# Patient Record
Sex: Male | Born: 1935
Health system: Southern US, Community
[De-identification: ages and names within clinical notes are randomized; demographics above are authoritative.]

## PROBLEM LIST (undated history)

## (undated) DIAGNOSIS — N400 Enlarged prostate without lower urinary tract symptoms: Secondary | ICD-10-CM

## (undated) DIAGNOSIS — I4891 Unspecified atrial fibrillation: Secondary | ICD-10-CM

## (undated) DIAGNOSIS — E785 Hyperlipidemia, unspecified: Secondary | ICD-10-CM

## (undated) DIAGNOSIS — C801 Malignant (primary) neoplasm, unspecified: Secondary | ICD-10-CM

## (undated) DIAGNOSIS — H905 Unspecified sensorineural hearing loss: Secondary | ICD-10-CM

## (undated) DIAGNOSIS — M8580 Other specified disorders of bone density and structure, unspecified site: Secondary | ICD-10-CM

## (undated) DIAGNOSIS — Z950 Presence of cardiac pacemaker: Secondary | ICD-10-CM

## (undated) DIAGNOSIS — H3530 Unspecified macular degeneration: Secondary | ICD-10-CM

## (undated) DIAGNOSIS — D649 Anemia, unspecified: Secondary | ICD-10-CM

## (undated) DIAGNOSIS — N529 Male erectile dysfunction, unspecified: Secondary | ICD-10-CM

## (undated) DIAGNOSIS — M1990 Unspecified osteoarthritis, unspecified site: Secondary | ICD-10-CM

## (undated) DIAGNOSIS — B081 Molluscum contagiosum: Secondary | ICD-10-CM

## (undated) DIAGNOSIS — I10 Essential (primary) hypertension: Secondary | ICD-10-CM

## (undated) DIAGNOSIS — C679 Malignant neoplasm of bladder, unspecified: Secondary | ICD-10-CM

## (undated) HISTORY — PX: CATARACT EXTRACTION: SUR2

## (undated) HISTORY — PX: HEMORRHOIDECTOMY WITH HEMORRHOID BANDING: SHX5633

## (undated) HISTORY — DX: Molluscum contagiosum: B08.1

## (undated) HISTORY — PX: TONSILLECTOMY: SUR1361

## (undated) HISTORY — PX: KNEE ARTHROSCOPY: SUR90

---

## 2004-11-01 ENCOUNTER — Encounter: Payer: Self-pay | Admitting: Internal Medicine

## 2004-11-07 ENCOUNTER — Encounter: Payer: Self-pay | Admitting: Internal Medicine

## 2006-01-29 ENCOUNTER — Ambulatory Visit: Payer: Self-pay | Admitting: Gastroenterology

## 2006-05-08 ENCOUNTER — Ambulatory Visit: Payer: Self-pay | Admitting: Internal Medicine

## 2006-05-08 IMAGING — US US CAROTID DUPLEX BILAT
1 series · 17 of 24 positions shown · non-contrast
Comparison: none

REASON FOR EXAM: artherosclerosis
COMMENTS:

[Series 1: us carotid duplex bilat · 17 of 58 slices shown]
[im 1/58]
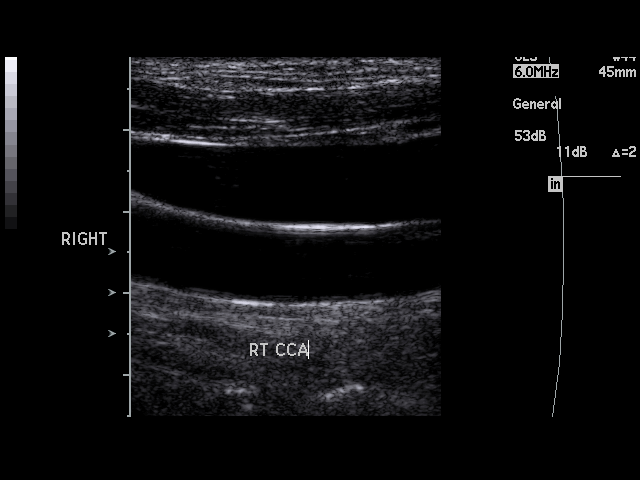
[im 5/58]
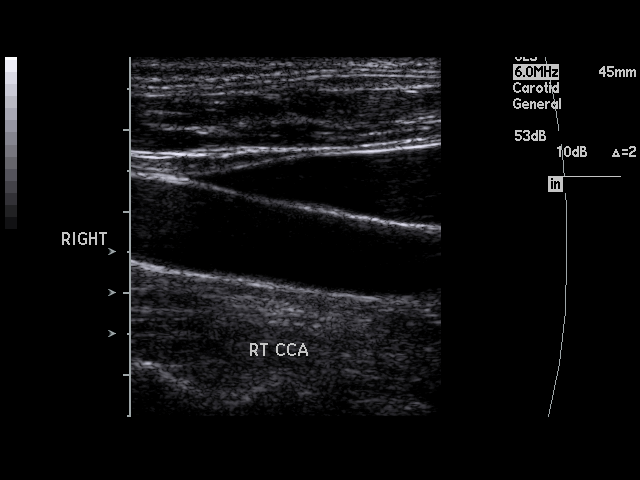
[im 8/58]
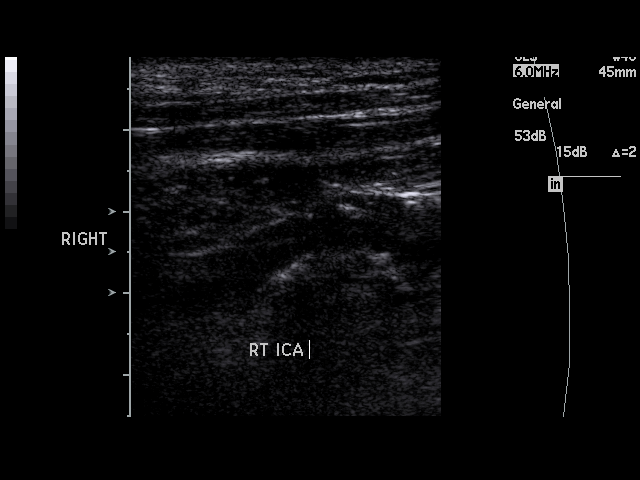
[im 10/58]
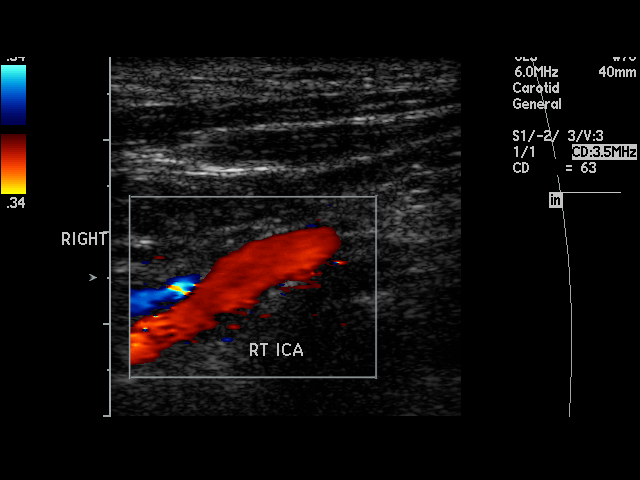
[im 15/58]
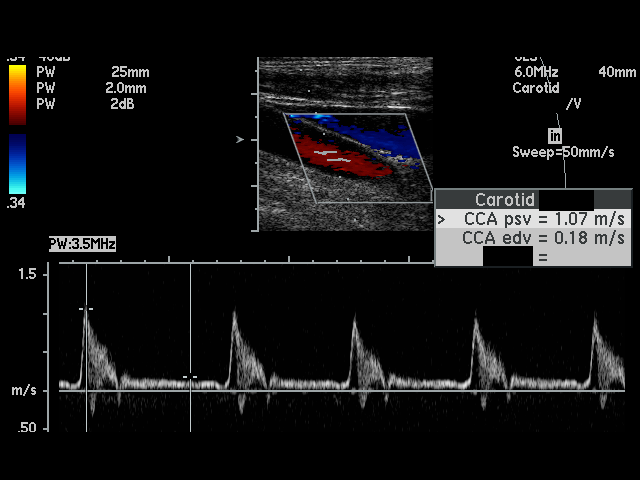
[im 18/58]
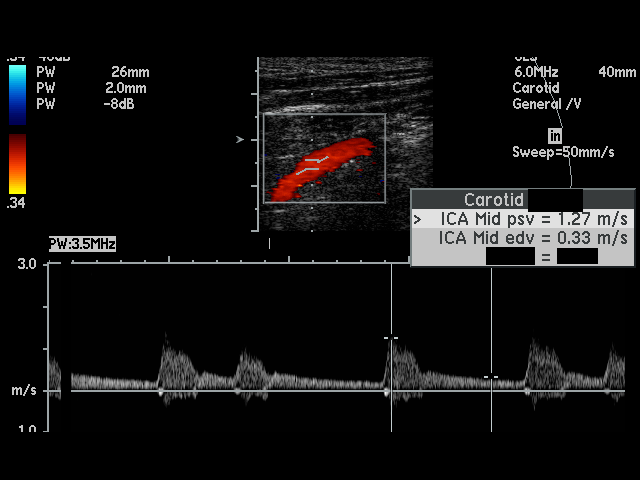
[im 23/58]
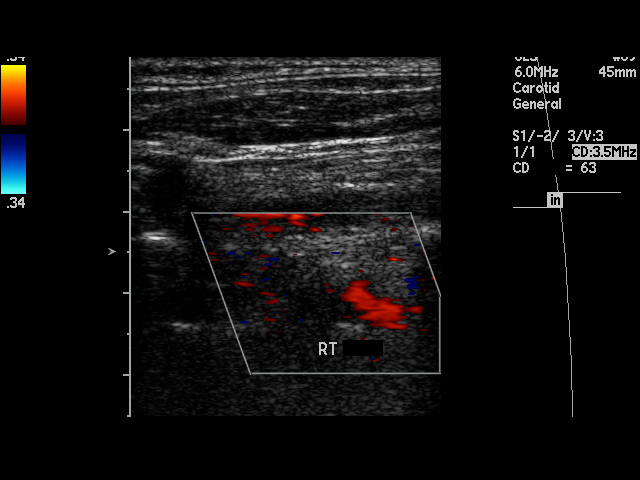
[im 25/58]
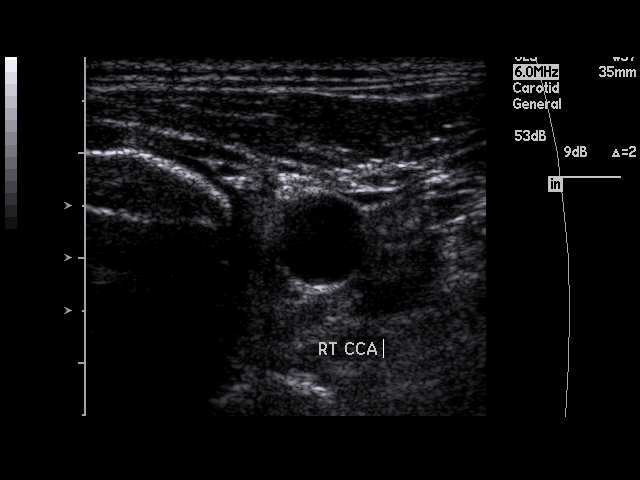
[im 30/58]
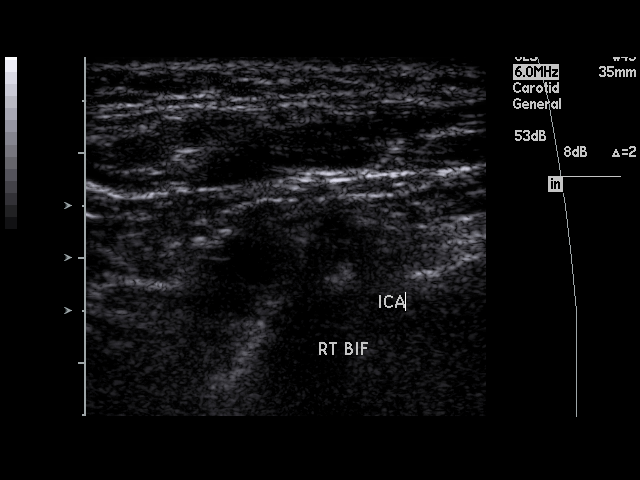
[im 33/58]
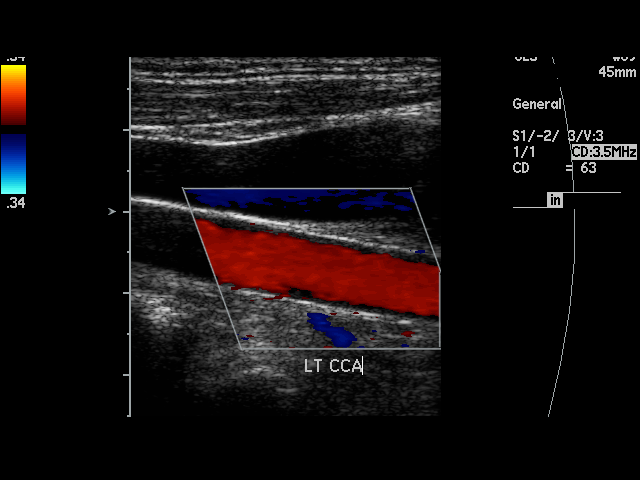
[im 35/58]
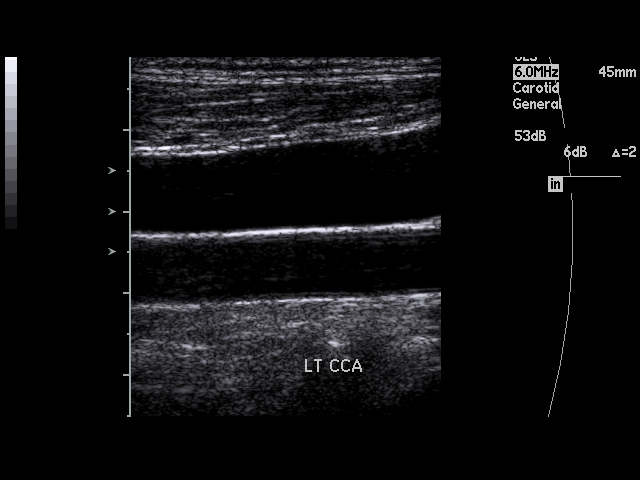
[im 40/58]
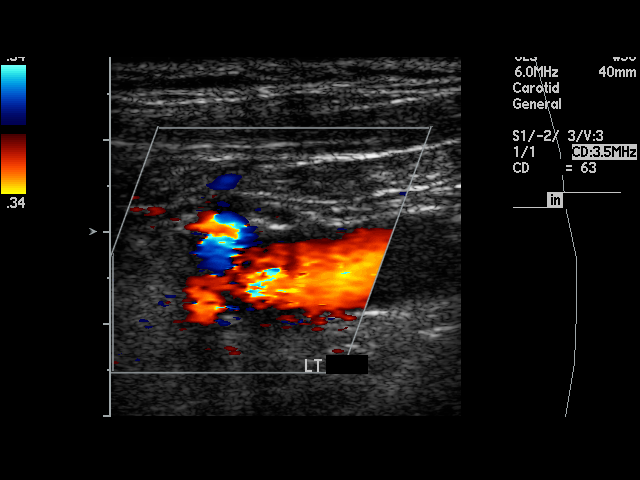
[im 43/58]
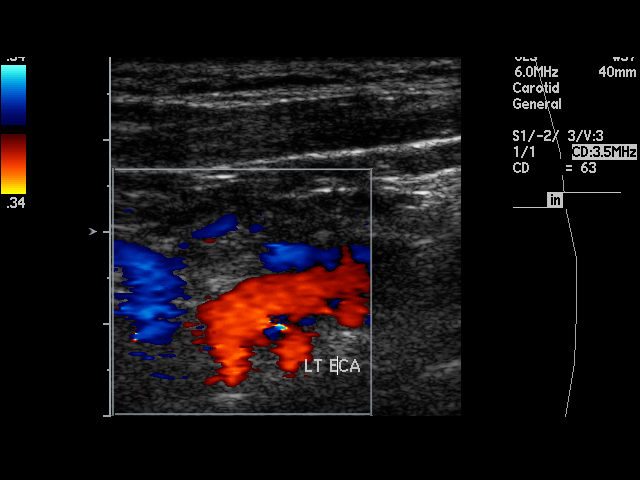
[im 48/58]
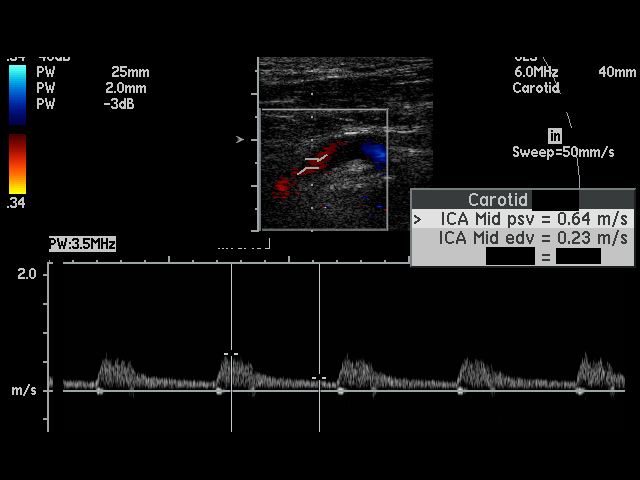
[im 50/58]
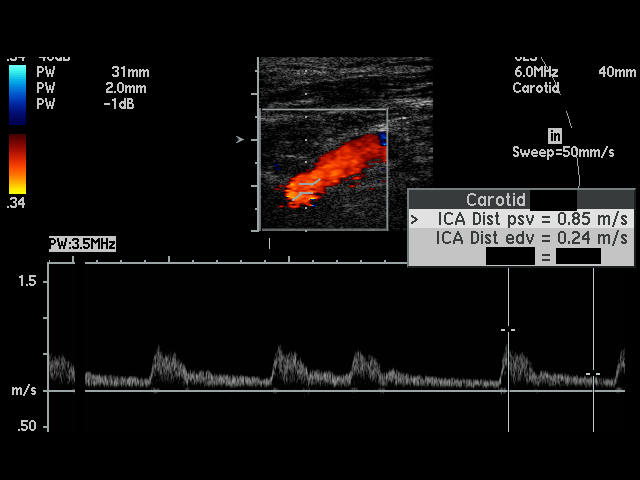
[im 53/58]
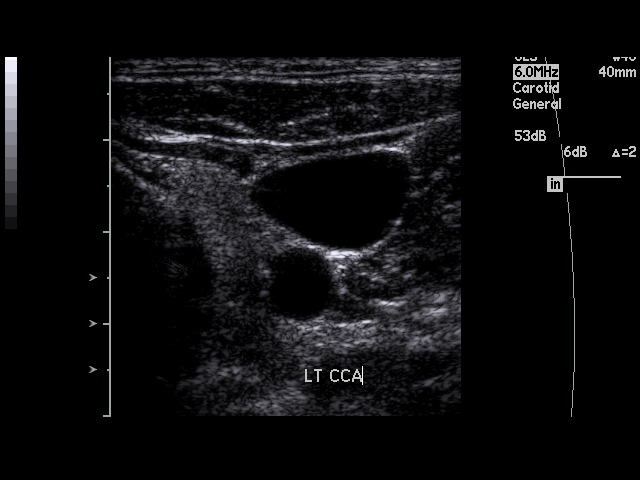
[im 58/58]
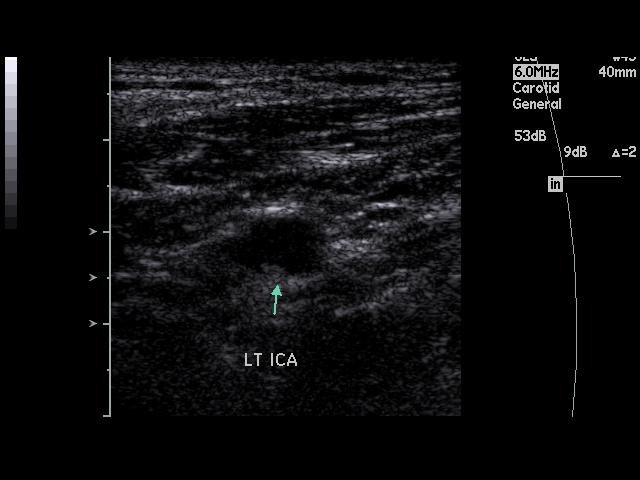

[17 of 24 positions shown; findings below may reference images not displayed]

PROCEDURE:     US  - US CAROTID DOPPLER BILATERAL  - [DATE] [DATE]

RESULT:     On the RIGHT there is a small amount of calcified plaque at the
bulb and proximal ICA.  Peak internal carotid systolic velocity measured 167
cm per second with peak common carotid velocity of 101 cm per second
corresponding to a ratio of 1.6.  On the LEFT peak internal carotid systolic
velocity measured approximately 70 cm per second with peak common carotid
velocity of 118 cm per second. This corresponds to a ratio of 0.7.  There is
a small amount of calcified plaque at the bulb on the LEFT. The vertebral
arteries are normal in flow direction bilaterally.
IMPRESSION: 1)I do not see evidence of significant carotid stenosis. A degree of
stenosis in the range of 50 to [DATE] be present on the RIGHT due to the
mildly elevated peak systolic velocity of 167 cm per second.

## 2009-04-01 ENCOUNTER — Ambulatory Visit: Payer: Self-pay | Admitting: Gastroenterology

## 2011-08-10 ENCOUNTER — Ambulatory Visit: Payer: Self-pay | Admitting: Otolaryngology

## 2011-08-10 DIAGNOSIS — I10 Essential (primary) hypertension: Secondary | ICD-10-CM

## 2011-08-10 LAB — POTASSIUM: Potassium: 4.4 mmol/L (ref 3.5–5.1)

## 2011-08-16 ENCOUNTER — Ambulatory Visit: Payer: Self-pay | Admitting: Otolaryngology

## 2012-05-28 ENCOUNTER — Ambulatory Visit: Payer: Self-pay | Admitting: Gastroenterology

## 2013-09-29 DIAGNOSIS — H3530 Unspecified macular degeneration: Secondary | ICD-10-CM | POA: Insufficient documentation

## 2013-09-29 DIAGNOSIS — H905 Unspecified sensorineural hearing loss: Secondary | ICD-10-CM | POA: Insufficient documentation

## 2013-09-29 DIAGNOSIS — M720 Palmar fascial fibromatosis [Dupuytren]: Secondary | ICD-10-CM | POA: Insufficient documentation

## 2013-09-29 DIAGNOSIS — M8580 Other specified disorders of bone density and structure, unspecified site: Secondary | ICD-10-CM | POA: Insufficient documentation

## 2013-09-29 DIAGNOSIS — N529 Male erectile dysfunction, unspecified: Secondary | ICD-10-CM | POA: Insufficient documentation

## 2013-11-14 DIAGNOSIS — R7302 Impaired glucose tolerance (oral): Secondary | ICD-10-CM | POA: Insufficient documentation

## 2013-11-14 DIAGNOSIS — Z79899 Other long term (current) drug therapy: Secondary | ICD-10-CM | POA: Insufficient documentation

## 2013-11-14 DIAGNOSIS — D72819 Decreased white blood cell count, unspecified: Secondary | ICD-10-CM | POA: Insufficient documentation

## 2014-11-01 NOTE — Op Note (Signed)
PATIENT NAME:  Christopher [name], Christopher [name] MR#:  [id number] DATE OF BIRTH:  [birth year]  DATE OF PROCEDURE:  08/16/2011  PREOPERATIVE DIAGNOSES: 1. Left lower eyelid ectropion.  2. Left forehead Mohs defect (2 x 3 cm).   POSTOPERATIVE DIAGNOSIS:  1. Left lower eyelid ectropion.  2. Left forehead Mohs defect (2 x 3 cm).   PROCEDURES:  1. Left upper eyelid to lower eyelid bipedicle myocutaneous flap.  2. Left canthoplasty.  3. Left Frost stitch.  4. Left forehead advancement rotation flap.   SURGEON: Janalee Dane, MD   DESCRIPTION OF PROCEDURE: The patient was placed in supine position on the operating room table. After general endotracheal anesthesia had been induced, the patient was turned 90 degrees clockwise from anesthesia. The left forehead was locally anesthetized, prepped and draped in the usual fashion. A 15 blade was used to make a backcut laterally and a Z-plasty was created medially so as to not elevate the brow too superiorly which would detract from the redundancy of upper eyelid tissue. Undermining was carried out in a subfrontalis plane making sure to preserve the insertion of the frontal branch of the facial nerve. The flaps were rotated, advanced, and secured with multiple interrupted 3-0 Vicryl and 4-0 Vicryl suture. The skin was then closed with multiple interrupted 5-0 nylon and 6-0 nylon sutures. Attention was directed to the eye. Topical proparacaine and local anesthesia was used. A corneal shield was placed. A 15-C blade was used to make incisions in the supratarsal crease, the superior incision and subciliary incision. The lower eyelid orbicularis and skin was separated to allow for inset of the flap. A 4-0 PDS suture was used to create a suspension canthoplasty and this was secured at [employer] tubercle. The flap was then elevated in the suborbicularis oculi muscle plane. Care was taken to preserve the pedicles medially and laterally. The flap was then inset and after the harvest  site had been closed 7-0 nylon the superior subciliary incision was closed with 6-0 fast absorbing gut. The inferior closure was carried out with 6-0 nylon suture allowing space for bleeding to occur so that no hematoma would form. A Frost stitch using a horizontal mattress over a Telfa pledget was carried out with a 6-0 nylon suture. Iced gauze was placed against the eye. The corneal shield had been removed before the Frost stitch and the patient was returned to anesthesia, allowed to emerge from anesthesia in the operating room, and taken to the recovery room in stable condition. There were no complications. Estimated blood loss 10 mL.   ____________________________ J. Nadeen Landau, MD [id number] D: 08/16/2011 19:32:38 ET T: 08/17/2011 07:16:20 ET JOB#: [id number]  cc: Janalee Dane, MD, <Dictator> Nicholos Johns MD ELECTRONICALLY SIGNED 08/23/2011 8:25

## 2015-01-04 DIAGNOSIS — R809 Proteinuria, unspecified: Secondary | ICD-10-CM | POA: Insufficient documentation

## 2015-02-28 ENCOUNTER — Encounter: Payer: Self-pay | Admitting: *Deleted

## 2015-02-28 ENCOUNTER — Emergency Department: Payer: Medicare Other

## 2015-02-28 ENCOUNTER — Inpatient Hospital Stay
Admission: EM | Admit: 2015-02-28 | Discharge: 2015-03-04 | DRG: 244 | Disposition: A | Discharge status: Home / Self Care | Payer: Medicare Other | Attending: Internal Medicine | Admitting: Internal Medicine

## 2015-02-28 DIAGNOSIS — Z789 Other specified health status: Secondary | ICD-10-CM | POA: Insufficient documentation

## 2015-02-28 DIAGNOSIS — E785 Hyperlipidemia, unspecified: Secondary | ICD-10-CM | POA: Diagnosis present

## 2015-02-28 DIAGNOSIS — H905 Unspecified sensorineural hearing loss: Secondary | ICD-10-CM | POA: Diagnosis present

## 2015-02-28 DIAGNOSIS — F1090 Alcohol use, unspecified, uncomplicated: Secondary | ICD-10-CM | POA: Diagnosis present

## 2015-02-28 DIAGNOSIS — R001 Bradycardia, unspecified: Principal | ICD-10-CM | POA: Diagnosis present

## 2015-02-28 DIAGNOSIS — Z79899 Other long term (current) drug therapy: Secondary | ICD-10-CM

## 2015-02-28 DIAGNOSIS — Z7982 Long term (current) use of aspirin: Secondary | ICD-10-CM | POA: Diagnosis not present

## 2015-02-28 DIAGNOSIS — Z833 Family history of diabetes mellitus: Secondary | ICD-10-CM | POA: Diagnosis not present

## 2015-02-28 DIAGNOSIS — F10929 Alcohol use, unspecified with intoxication, unspecified: Secondary | ICD-10-CM

## 2015-02-28 DIAGNOSIS — I10 Essential (primary) hypertension: Secondary | ICD-10-CM | POA: Diagnosis present

## 2015-02-28 DIAGNOSIS — R350 Frequency of micturition: Secondary | ICD-10-CM

## 2015-02-28 DIAGNOSIS — M8580 Other specified disorders of bone density and structure, unspecified site: Secondary | ICD-10-CM | POA: Diagnosis present

## 2015-02-28 DIAGNOSIS — F10129 Alcohol abuse with intoxication, unspecified: Secondary | ICD-10-CM | POA: Diagnosis present

## 2015-02-28 DIAGNOSIS — N400 Enlarged prostate without lower urinary tract symptoms: Secondary | ICD-10-CM | POA: Diagnosis present

## 2015-02-28 DIAGNOSIS — Z538 Procedure and treatment not carried out for other reasons: Secondary | ICD-10-CM | POA: Diagnosis not present

## 2015-02-28 DIAGNOSIS — R55 Syncope and collapse: Secondary | ICD-10-CM | POA: Diagnosis present

## 2015-02-28 DIAGNOSIS — Z801 Family history of malignant neoplasm of trachea, bronchus and lung: Secondary | ICD-10-CM

## 2015-02-28 DIAGNOSIS — N401 Enlarged prostate with lower urinary tract symptoms: Secondary | ICD-10-CM | POA: Diagnosis present

## 2015-02-28 DIAGNOSIS — E7800 Pure hypercholesterolemia, unspecified: Secondary | ICD-10-CM | POA: Diagnosis present

## 2015-02-28 DIAGNOSIS — I959 Hypotension, unspecified: Secondary | ICD-10-CM

## 2015-02-28 DIAGNOSIS — Z7289 Other problems related to lifestyle: Secondary | ICD-10-CM | POA: Diagnosis present

## 2015-02-28 DIAGNOSIS — Z950 Presence of cardiac pacemaker: Secondary | ICD-10-CM

## 2015-02-28 HISTORY — DX: Unspecified macular degeneration: H35.30

## 2015-02-28 HISTORY — DX: Gilbert syndrome: E80.4

## 2015-02-28 HISTORY — DX: Other specified disorders of bone density and structure, unspecified site: M85.80

## 2015-02-28 HISTORY — DX: Unspecified sensorineural hearing loss: H90.5

## 2015-02-28 HISTORY — DX: Benign prostatic hyperplasia without lower urinary tract symptoms: N40.0

## 2015-02-28 HISTORY — DX: Male erectile dysfunction, unspecified: N52.9

## 2015-02-28 HISTORY — DX: Hyperlipidemia, unspecified: E78.5

## 2015-02-28 HISTORY — DX: Essential (primary) hypertension: I10

## 2015-02-28 LAB — COMPREHENSIVE METABOLIC PANEL
ALK PHOS: 51 U/L (ref 38–126)
ALT: 18 U/L (ref 17–63)
AST: 24 U/L (ref 15–41)
Albumin: 3.7 g/dL (ref 3.5–5.0)
Anion gap: 7 (ref 5–15)
BUN: 25 mg/dL — ABNORMAL HIGH (ref 6–20)
CALCIUM: 9.5 mg/dL (ref 8.9–10.3)
CO2: 26 mmol/L (ref 22–32)
CREATININE: 1.1 mg/dL (ref 0.61–1.24)
Chloride: 104 mmol/L (ref 101–111)
GFR, EST NON AFRICAN AMERICAN: 60 mL/min — AB (ref >60)
Glucose, Bld: 101 mg/dL — ABNORMAL HIGH (ref 65–99)
Potassium: 4.8 mmol/L (ref 3.5–5.1)
Sodium: 137 mmol/L (ref 135–145)
Total Bilirubin: 0.7 mg/dL (ref 0.3–1.2)
Total Protein: 6.6 g/dL (ref 6.5–8.1)

## 2015-02-28 LAB — CBC
HCT: 43.4 % (ref 40.0–52.0)
HEMOGLOBIN: 14.3 g/dL (ref 13.0–18.0)
MCH: 31.7 pg (ref 26.0–34.0)
MCHC: 33 g/dL (ref 32.0–36.0)
MCV: 96 fL (ref 80.0–100.0)
Platelets: 208 10*3/uL (ref 150–440)
RBC: 4.52 MIL/uL (ref 4.40–5.90)
RDW: 14.2 % (ref 11.5–14.5)
WBC: 5.1 10*3/uL (ref 3.8–10.6)

## 2015-02-28 LAB — ETHANOL: Alcohol, Ethyl (B): 113 mg/dL — ABNORMAL HIGH (ref <5)

## 2015-02-28 LAB — PROTIME-INR
INR: 1
PROTHROMBIN TIME: 13.4 s (ref 11.4–15.0)

## 2015-02-28 LAB — TROPONIN I: Troponin I: <0.03 ng/mL (ref <0.031)

## 2015-02-28 LAB — APTT: aPTT: 29 seconds (ref 24–36)

## 2015-02-28 LAB — MAGNESIUM: MAGNESIUM: 2.2 mg/dL (ref 1.7–2.4)

## 2015-02-28 IMAGING — CR DG CHEST 1V PORT
1 series · 1 of 1 positions shown · non-contrast
Comparison: None.

CLINICAL DATA: Dizziness for 3 days, worse with standing up.
Syncopal episode tonight. Hypotension.

EXAM:
PORTABLE CHEST - 1 VIEW

[portable]
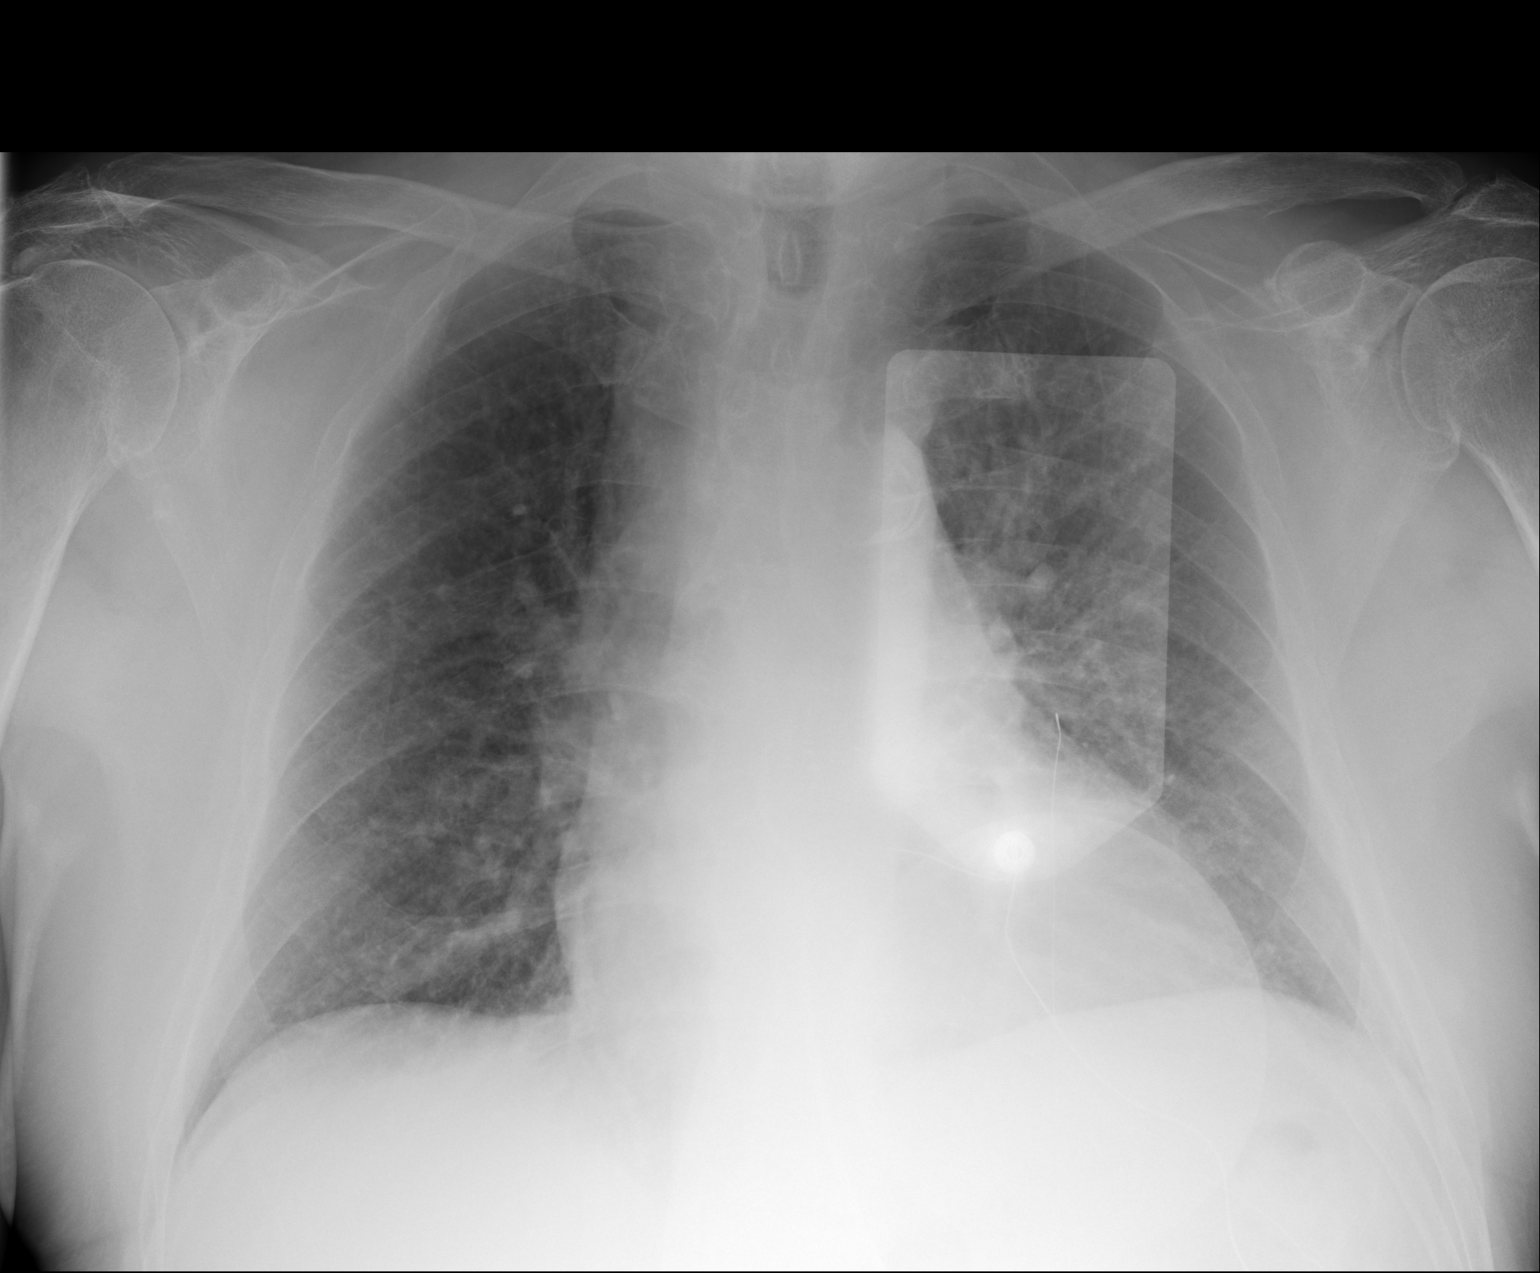

[1 of 1 positions shown; findings below may reference images not displayed]

FINDINGS: Slightly shallow inspiration. The heart size and mediastinal
contours are within normal limits. Both lungs are clear. The
visualized skeletal structures are unremarkable.
IMPRESSION: No active disease.

## 2015-02-28 MED ORDER — SODIUM CHLORIDE 0.9 % IV BOLUS (SEPSIS)
500.0000 mL | INTRAVENOUS | Status: AC
  Administered 2015-02-28: 500 mL via INTRAVENOUS

## 2015-02-28 MED ORDER — ATROPINE SULFATE 1 MG/ML IJ SOLN: 0.5000 mg | Freq: Once | INTRAMUSCULAR | Status: DC

## 2015-02-28 NOTE — ED Provider Notes (Addendum)
[facility] Emergency Department Provider Note  ____________________________________________  Time seen: Approximately 8:33 PM  I have reviewed the triage vital signs and the nursing notes.   HISTORY  Chief Complaint Dizziness    HPI Christopher [name] is a 81 y.o. male who is generally healthy for his age and well-appearing with only history of hypertension who presents after a syncopal episode tonight.  Reportedly he has been lightheaded and dizzy for about 3 days which is worse with standing up.  Tonight he was having dinner with his family andstates that he had a couple glasses of wine which is normal for him.  He then stood up and "the next thing I knew I was on the ground".  Reportedly he went down but struck a soft chair and he is currently denying any headache, neck pain, or extremity pain.  His blood pressure reportedly was low and he had a heart rate in the 50s prior to arrival.  In the emergency department his heart rate is in the upper 30s to lower 40s, covering most of the time right at 40.  The onset of the syncopal episode tonight was acute with no prodrome.  He denies any recent history of chest pain, shortness of breath, abdominal pain, back pain, nausea/vomiting, or diarrhea.  He has had no infectious symptoms recently.  He has no headache and no numbness nor tingling nor weaknessin his extremities.  He denies any ACS/cardiac history.   Past Medical History  Diagnosis Date  . Hypertension     There are no active problems to display for this patient.   No past surgical history on file.  Current Outpatient Rx  Name  Route  Sig  Dispense  Refill  . aspirin 325 MG tablet   Oral   Take 1 tablet by mouth daily.         Marland Kitchen atorvastatin (LIPITOR) 10 MG tablet   Oral   Take 1 tablet by mouth daily.         . calcium carbonate (TUMS - DOSED IN MG ELEMENTAL CALCIUM) 500 MG chewable tablet   Oral   Chew 1 tablet by mouth daily.         .  Cholecalciferol (VITAMIN D3) 1000 UNITS CAPS   Oral   Take 1 capsule by mouth daily.         . finasteride (PROSCAR) 5 MG tablet   Oral   Take 1 tablet by mouth daily.         . hydrochlorothiazide (HYDRODIURIL) 25 MG tablet   Oral   Take 12.5 mg by mouth daily.         Marland Kitchen lisinopril (PRINIVIL,ZESTRIL) 10 MG tablet   Oral   Take 1 tablet by mouth daily.         . Multiple Vitamin (MULTI-VITAMINS) TABS   Oral   Take 1 tablet by mouth daily.         . Multiple Vitamins-Minerals (OCUVITE PRESERVISION PO)   Oral   Take 1 tablet by mouth 2 (two) times daily.         Marland Kitchen NIFEdipine (PROCARDIA-XL/ADALAT CC) 30 MG 24 hr tablet   Oral   Take 1 tablet by mouth daily.         . Omega-3 Fatty Acids (FISH OIL) 1000 MG CAPS   Oral   Take 1 tablet by mouth 2 (two) times daily.         . tamsulosin (FLOMAX) 0.4 MG CAPS capsule  Oral   Take 1 capsule by mouth daily.         Marland Kitchen ZOSTAVAX 62229 UNT/0.65ML injection   Intramuscular   Inject 1 vial into the muscle once.           Dispense as written.     Allergies Review of patient's allergies indicates no known allergies.  No family history on file.  Social History Social History  Substance Use Topics  . Smoking status: Never Smoker   . Smokeless tobacco: None  . Alcohol Use: Yes    Review of Systems Constitutional: No fever/chills.  Passed out after dinner Eyes: No visual changes. ENT: No sore throat. Cardiovascular: Denies chest pain. Respiratory: Denies shortness of breath. Gastrointestinal: No abdominal pain.  No nausea, no vomiting.  No diarrhea.  No constipation. Genitourinary: Negative for dysuria. Musculoskeletal: Negative for back pain. Skin: Negative for rash. Neurological: Negative for headaches, focal weakness or numbness.  10-point ROS otherwise negative.  ____________________________________________   PHYSICAL EXAM:  VITAL SIGNS: ED Triage Vitals  Enc Vitals Group     BP  02/28/15 2029 100/45 mmHg     Pulse Rate 02/28/15 2029 39     Resp 02/28/15 2029 16     Temp 02/28/15 2029 98.2 F (36.8 C)     Temp Source 02/28/15 2029 Oral     SpO2 02/28/15 2029 96 %     Weight 02/28/15 2029 200 lb (90.719 kg)     Height 02/28/15 2029 6' (1.829 m)     Head Cir --      Peak Flow --      Pain Score --      Pain Loc --      Pain Edu? --      Excl. in [place]? --     Constitutional: Alert and oriented. Well appearing and in no acute distress.appears younger than chronological age Eyes: Conjunctivae are normal. PERRL. EOMI. Head: Atraumatic. Nose: No congestion/rhinnorhea. Mouth/Throat: Mucous membranes are moist.  Oropharynx non-erythematous. Neck: No stridor.  No cervical spine tenderness to palpation. Cardiovascular: profound bradycardia. Grossly normal heart sounds.  Good peripheral circulation. Respiratory: Normal respiratory effort.  No retractions. Lungs CTAB. Gastrointestinal: Soft and nontender. No distention. No abdominal bruits. No CVA tenderness. Musculoskeletal: No lower extremity tenderness nor edema.  No joint effusions. Neurologic:  Normal speech and language. No gross focal neurologic deficits are appreciated.  Skin:  Skin is warm, dry and intact. No rash noted. Psychiatric: Mood and affect are normal. Speech and behavior are normal.  ____________________________________________   LABS (all labs ordered are listed, but only abnormal results are displayed)  Labs Reviewed  COMPREHENSIVE METABOLIC PANEL - Abnormal; Notable for the following:    Glucose, Bld 101 (*)    BUN 25 (*)    GFR calc non Af Amer 60 (*)    All other components within normal limits  ETHANOL - Abnormal; Notable for the following:    Alcohol, Ethyl (B) 113 (*)    All other components within normal limits  APTT  CBC  MAGNESIUM  PROTIME-INR  TROPONIN I  URINALYSIS COMPLETEWITH MICROSCOPIC ([facility] ONLY)   ____________________________________________  EKG  ED ECG  REPORT I, Nyeli Holtmeyer, the attending physician, personally viewed and interpreted this ECG.   Date: 02/28/2015  EKG Time: 20:29  Rate: 40  Rhythm: sinus bradycardia  Axis: normal  Intervals:first-degree A-V block   ST&T Change: no ST or T-wave changes that would indicate acute ischemia  ____________________________________________  RADIOLOGY I, Micha Dosanjh,  personally viewed and evaluated these images (plain radiographs) as part of my medical decision making.   Dg Chest Portable 1 View  02/28/2015   CLINICAL DATA:  Dizziness for 3 days, worse with standing up. Syncopal episode tonight. Hypotension.  EXAM: PORTABLE CHEST - 1 VIEW  COMPARISON:  None.  FINDINGS: Slightly shallow inspiration. The heart size and mediastinal contours are within normal limits. Both lungs are clear. The visualized skeletal structures are unremarkable.  IMPRESSION: No active disease.   Electronically Signed   By: Lucienne Capers M.D.   On: 02/28/2015 21:25    ____________________________________________   PROCEDURES  Procedure(s) performed: None  Critical Care performed: No ____________________________________________   INITIAL IMPRESSION / ASSESSMENT AND PLAN / ED COURSE  Pertinent labs & imaging results that were available during my care of the patient were reviewed by me and considered in my medical decision making (see chart for details).  I am concerned about the patient's profound symptomatic bradycardia.  Though it does appear to be a sinus rhythm rather than a second or third degree heart block, I believe this is most likely the cause of his syncopal episode.  Though he has had no recent symptoms I worry that he may have had a cardiac event and his cardiac output is diminished from his baseline.  I have asked that he be placed on the Zoloft with pads in case pacing as necessary.  Currently he is alert and oriented 3 and is in no acute distress so hold off treating the bradycardia.  I am not  checking orthostatics given that I do not believe it would be safe at this time and given his pulse and relative hypotension.  I am also giving him a small fluid bolus of 500 mL normal saline and will likely give more.  ----------------------------------------- 9:50 PM on 02/28/2015 -----------------------------------------  The patient's labs are all reassuring and notable only for an ethanol level of 113 which I anticipate given the patient's history.  He remains alert and oriented but his blood pressure remains low with a systolic right around 703 and it had dropped down into the 90s, even after 1 L normal saline bolus.  I do not have a good explanation for his symptomatic bradycardia, particularly in the setting of hypotension; his heart rate should be elevated if he is hypotensive, and I feel that symptomatic bradycardia in the setting of hypotension bears cardiology evaluation.  I will leave him on the Zoll, I updated the patient and his family, and I have admitted him to the hospitalist.  ----------------------------------------- 11:16 PM on 02/28/2015 -----------------------------------------  It is been brought to my attention multiple times that the patient continues to be intermittently per family bradycardic and occasionally has pauses to about 3 seconds.  I reassessed him and ask how he feels and he states he has felt the same the whole time and has had no additional symptoms.  He is watching TV, remains alert and oriented with good mentation, and brightly said "thanks for checking on me, Doc!"  when I left the room.  At the request of Dr. Jannifer Franklin, the hospitalist, I spoke with the on-call cardiologist, Dr. Burt Knack.  I explained the situation in detail and we discussed the various findings on his EKG.  He agrees with my management up until this point and feels we should continue the plan to admit to the ICU with the Zoll in place but nottreat his bradycardia as long as he remains mentating  well and otherwise asymptomatic.  Keep him on bed rest.  If he were to start decompensating, Dr. Burt Knack recommended a very low dose dopamine infusion to keep his heart rate up.  He feels the patient likely has a nodal disorder and may end up with a pacemaker.  I advised Dr. Jannifer Franklin of my conversation and he understands and agrees with the plan.  Of note, Dr. Burt Knack also said he would be happy for Dr. Ubaldo Glassing or one of his partners to see the patient at the patient's requestsince the patient's wife is also a patient of Dr. Ubaldo Glassing. ____________________________________________  FINAL CLINICAL IMPRESSION(S) / ED DIAGNOSES  Final diagnoses:  Symptomatic bradycardia  Syncope, cardiogenic  Alcohol intoxication, with unspecified complication  Hypotension, unspecified hypotension type      NEW MEDICATIONS STARTED DURING THIS VISIT:  New Prescriptions   No medications on file     Hinda Kehr, MD 02/28/15 2151  Hinda Kehr, MD 02/28/15 2320

## 2015-02-28 NOTE — ED Notes (Signed)
External defibrillator attached with anterior and posterior all purpose pads and at bedside.

## 2015-02-28 NOTE — ED Notes (Signed)
Hospitalist at bedside evaluating patient.

## 2015-02-28 NOTE — ED Notes (Signed)
Dr. Karma Greaser at bedside evaluating patient.  Cardiology has been called.  Drs. Jannifer Franklin and [place] consulting on patient.  Continue to monitor.

## 2015-02-28 NOTE — ED Notes (Signed)
Pt reports dizziness x 3 days, worse with standing up. Pt had syncopal episode tonight, family friend took bp immediately after and it was in the 53's, hr in the 50's.

## 2015-02-28 NOTE — ED Notes (Signed)
AAOx3.  Skin warm and dry.  No SOB/DOE.  Denies c/o pain.  Continue to monitor.

## 2015-02-28 NOTE — ED Notes (Signed)
AAOx3.  Skin warm and dry.  States head feels "heavy", which is unchanged since syncopal episode.

## 2015-02-28 NOTE — ED Notes (Signed)
Patient noted have a 6 second pause.  Patient remains AAOx3.  Skin warm and dry.  No SOB/DOE.  Resting in bed.  NAD.  Hospitalist notified, patient admission status changed from telemetry to CCU.  Pacer pads remain on patient.  Defibrillator at bedside.  Continue to monitor.

## 2015-02-28 NOTE — H&P (Signed)
[facility] [place] NAME: Christopher [name]    MR#:  [id number]  DATE OF BIRTH:  1927/08/22  DATE OF ADMISSION:  02/28/2015  PRIMARY CARE PHYSICIAN: Ezequiel Kayser, MD   REQUESTING/REFERRING PHYSICIAN: Karma Greaser , M.D.  CHIEF COMPLAINT:   Chief Complaint  Patient presents with  . Dizziness    HISTORY OF PRESENT ILLNESS:  Christopher [name]  is a 81 y.o. male who presents with  Symptomatic bradycardia and a syncopal episode. Patient states he's been feeling dizzy for the past 2-3 days. He was at home sitting down watching the Olympics with his wife tonight , when he got up to go get some in the kitchen he had a syncopal episode , and states it had difficult time trying to stand back up again once he came to. He denies any significant trauma or hitting his head. His wife states that she did not witness any seizure-like activity, patient did not lose control of bowel or bladder, there was no true postictal state. Patient denies any chest pain, cough, recent fever /chills, abdominal pain, nausea/vomiting , or other symptoms. See full review of systems below. Patient does state that he has wife usually have couple of drinks each night , but that they've been doing this for a long time the patient has never had symptoms like this from the same. He denies any history of cardiac disease.  He was brought via EMS, who found him to be significantly bradycardic with a heart rate mostly in the 30s and 40s. EKG reads  as sinus bradycardia with first-degree AV block. Patient does state that he is on Procardia , as he had some reaction to atenolol in the past. He states that he was told at some point in the past he needed to be on a medication to treat his "fluctuating heart". Hospitalists were called for admission for workup of syncope and symptomatic bradycardia.  PAST MEDICAL HISTORY:   Past Medical History  Diagnosis Date  . Hypertension   . HLD  (hyperlipidemia)   . Gilbert disease   . Sensorineural hearing loss   . Osteopenia   . ED (erectile dysfunction)   . Macular degeneration   . BPH (benign prostatic hyperplasia)     PAST SURGICAL HISTORY:   Past Surgical History  Procedure Laterality Date  . Knee arthroscopy    . Cataract extraction    . Tonsillectomy    . Hemorrhoidectomy with hemorrhoid banding      SOCIAL HISTORY:   Social History  Substance Use Topics  . Smoking status: Never Smoker   . Smokeless tobacco: Not on file  . Alcohol Use: Yes    FAMILY HISTORY:   Family History  Problem Relation Age of Onset  . Diabetes Mother   . Lung cancer Father   . Cancer Paternal Uncle     DRUG ALLERGIES:  No Known Allergies  MEDICATIONS AT HOME:   Prior to Admission medications   Medication Sig Start Date End Date Taking? Authorizing Provider  aspirin 325 MG tablet Take 1 tablet by mouth daily.   Yes Historical Provider, MD  atorvastatin (LIPITOR) 10 MG tablet Take 1 tablet by mouth daily. 11/18/14  Yes Historical Provider, MD  calcium carbonate (TUMS - DOSED IN MG ELEMENTAL CALCIUM) 500 MG chewable tablet Chew 1 tablet by mouth daily.   Yes Historical Provider, MD  Cholecalciferol (VITAMIN D3) 1000 UNITS CAPS Take 1 capsule by mouth daily.   Yes Historical  Provider, MD  finasteride (PROSCAR) 5 MG tablet Take 1 tablet by mouth daily. 11/18/14  Yes Historical Provider, MD  hydrochlorothiazide (HYDRODIURIL) 25 MG tablet Take 12.5 mg by mouth daily.   Yes Historical Provider, MD  lisinopril (PRINIVIL,ZESTRIL) 10 MG tablet Take 1 tablet by mouth daily. 01/04/15 01/04/16 Yes Historical Provider, MD  Multiple Vitamin (MULTI-VITAMINS) TABS Take 1 tablet by mouth daily.   Yes Historical Provider, MD  Multiple Vitamins-Minerals (OCUVITE PRESERVISION PO) Take 1 tablet by mouth 2 (two) times daily.   Yes Historical Provider, MD  NIFEdipine (PROCARDIA-XL/ADALAT CC) 30 MG 24 hr tablet Take 1 tablet by mouth daily. 11/18/14  Yes  Historical Provider, MD  Omega-3 Fatty Acids (FISH OIL) 1000 MG CAPS Take 1 tablet by mouth 2 (two) times daily.   Yes Historical Provider, MD  tamsulosin (FLOMAX) 0.4 MG CAPS capsule Take 1 capsule by mouth daily. 08/18/14  Yes Historical Provider, MD  ZOSTAVAX 32992 UNT/0.65ML injection Inject 1 vial into the muscle once. 02/28/15  Yes Historical Provider, MD    REVIEW OF SYSTEMS:  Review of Systems  Constitutional: Negative for fever, chills, weight loss and malaise/fatigue.  HENT: Negative for ear pain, hearing loss and tinnitus.   Eyes: Negative for blurred vision, double vision, pain and redness.  Respiratory: Negative for cough, hemoptysis and shortness of breath.   Cardiovascular: Negative for chest pain, palpitations, orthopnea and leg swelling.  Gastrointestinal: Negative for nausea, vomiting, abdominal pain, diarrhea and constipation.  Genitourinary: Negative for dysuria, frequency and hematuria.  Musculoskeletal: Negative for back pain, joint pain and neck pain.  Skin:       No acne, rash, or lesions  Neurological: Positive for dizziness and loss of consciousness. Negative for tremors, focal weakness and weakness.  Endo/Heme/Allergies: Negative for polydipsia. Does not bruise/bleed easily.  Psychiatric/Behavioral: Negative for depression. The patient is not nervous/anxious and does not have insomnia.      VITAL SIGNS:   Filed Vitals:   02/28/15 2029 02/28/15 2039 02/28/15 2111 02/28/15 2159  BP: 100/45 113/34 91/47 101/52  Pulse: 39 46 38 39  Temp: 98.2 F (36.8 C)     TempSrc: Oral     Resp: 16 18 15 14   Height: 6' (1.829 m)     Weight: 90.719 kg (200 lb)     SpO2: 96% 93% 95% 93%   Wt Readings from Last 3 Encounters:  02/28/15 90.719 kg (200 lb)    PHYSICAL EXAMINATION:  Physical Exam  Vitals reviewed. Constitutional: He is oriented to person, place, and time. He appears well-developed and well-nourished. No distress.  HENT:  Head: Normocephalic and  atraumatic.  Mouth/Throat: Oropharynx is clear and moist.  Eyes: Conjunctivae and EOM are normal. Pupils are equal, round, and reactive to light. No scleral icterus.  Neck: Normal range of motion. Neck supple. No JVD present. No thyromegaly present.  Cardiovascular: Regular rhythm and intact distal pulses.  Exam reveals no gallop and no friction rub.   No murmur heard.  bradycardia  Respiratory: Effort normal and breath sounds normal. No respiratory distress. He has no wheezes. He has no rales.  GI: Soft. Bowel sounds are normal. He exhibits no distension. There is no tenderness.  Musculoskeletal: Normal range of motion. He exhibits edema ( 1+ bilateral lower extremity edema).  No arthritis, no gout  Lymphadenopathy:    He has no cervical adenopathy.  Neurological: He is alert and oriented to person, place, and time. No cranial nerve deficit.  No dysarthria, no aphasia  Skin:  Skin is warm and dry. No rash noted. No erythema.  Psychiatric: He has a normal mood and affect. His behavior is normal. Judgment and thought content normal.    LABORATORY PANEL:   CBC  Recent Labs Lab 02/28/15 2046  WBC 5.1  HGB 14.3  HCT 43.4  PLT 208   ------------------------------------------------------------------------------------------------------------------  Chemistries   Recent Labs Lab 02/28/15 2046  NA 137  K 4.8  CL 104  CO2 26  GLUCOSE 101*  BUN 25*  CREATININE 1.10  CALCIUM 9.5  MG 2.2  AST 24  ALT 18  ALKPHOS 51  BILITOT 0.7   ------------------------------------------------------------------------------------------------------------------  Cardiac Enzymes  Recent Labs Lab 02/28/15 2046  TROPONINI <0.03   ------------------------------------------------------------------------------------------------------------------  RADIOLOGY:  Dg Chest Portable 1 View  02/28/2015   CLINICAL DATA:  Dizziness for 3 days, worse with standing up. Syncopal episode tonight.  Hypotension.  EXAM: PORTABLE CHEST - 1 VIEW  COMPARISON:  None.  FINDINGS: Slightly shallow inspiration. The heart size and mediastinal contours are within normal limits. Both lungs are clear. The visualized skeletal structures are unremarkable.  IMPRESSION: No active disease.   Electronically Signed   By: Lucienne Capers M.D.   On: 02/28/2015 21:25    EKG:   Orders placed or performed during the hospital encounter of 02/28/15  . ED EKG  . ED EKG    IMPRESSION AND PLAN:  Principal Problem:   Symptomatic bradycardia -  Unclear etiology at this time. We'll hold his Procardia , this seems to be the only medication he is taking that might affect his heart rate. We'll trend cardiac enzymes, first set was negative in the ED. [facility] consult cardiology, order an echocardiogram. We'll keep him on telemetry. Active Problems:   Syncope, cardiogenic -  Likely due to the bradycardia , consult cardiology as above and defer to their recommendations.   HTN (hypertension) -  Blood pressure was on the low side when he came to the ED, we'll hold his antihypertensive medicines for now, support his blood pressure with fluids.   Alcohol use -  We will use CIWA protocol to monitor for signs of withdrawal while he is here.   HLD (hyperlipidemia) -  Continue home statin   BPH (benign prostatic hyperplasia) -  Continue finasteride  All the records are reviewed and case discussed with ED provider. Management plans discussed with the patient and/or family.  DVT PROPHYLAXIS: SubQ lovenox  ADMISSION STATUS: Inpatient  CODE STATUS:  full  TOTAL TIME TAKING CARE OF THIS PATIENT:  45 minutes.    Margee Trentham [place] 02/28/2015, 10:22 PM  [facility]  Office  [phone]  CC: Primary care physician; Ezequiel Kayser, MD

## 2015-03-01 ENCOUNTER — Inpatient Hospital Stay
Admit: 2015-03-01 | Discharge: 2015-03-01 | Disposition: A | Discharge status: Home / Self Care | Payer: Medicare Other | Attending: Internal Medicine | Admitting: Internal Medicine

## 2015-03-01 LAB — CBC
HCT: 41.8 % (ref 40.0–52.0)
Hemoglobin: 14 g/dL (ref 13.0–18.0)
MCH: 32 pg (ref 26.0–34.0)
MCHC: 33.5 g/dL (ref 32.0–36.0)
MCV: 95.4 fL (ref 80.0–100.0)
PLATELETS: 189 10*3/uL (ref 150–440)
RBC: 4.38 MIL/uL — ABNORMAL LOW (ref 4.40–5.90)
RDW: 14.1 % (ref 11.5–14.5)
WBC: 3.9 10*3/uL (ref 3.8–10.6)

## 2015-03-01 LAB — URINALYSIS COMPLETE WITH MICROSCOPIC (ARMC ONLY)
Bilirubin Urine: NEGATIVE
Glucose, UA: NEGATIVE mg/dL
HGB URINE DIPSTICK: NEGATIVE
KETONES UR: NEGATIVE mg/dL
Leukocytes, UA: NEGATIVE
NITRITE: NEGATIVE
PH: 6 (ref 5.0–8.0)
PROTEIN: NEGATIVE mg/dL
SPECIFIC GRAVITY, URINE: 1.01 (ref 1.005–1.030)
Squamous Epithelial / LPF: NONE SEEN

## 2015-03-01 LAB — BASIC METABOLIC PANEL
Anion gap: 6 (ref 5–15)
BUN: 19 mg/dL (ref 6–20)
CALCIUM: 8.9 mg/dL (ref 8.9–10.3)
CO2: 29 mmol/L (ref 22–32)
CREATININE: 0.83 mg/dL (ref 0.61–1.24)
Chloride: 105 mmol/L (ref 101–111)
GFR calc Af Amer: >60 mL/min (ref >60)
GLUCOSE: 93 mg/dL (ref 65–99)
POTASSIUM: 4.5 mmol/L (ref 3.5–5.1)
SODIUM: 140 mmol/L (ref 135–145)

## 2015-03-01 LAB — PHOSPHORUS: PHOSPHORUS: 3.1 mg/dL (ref 2.5–4.6)

## 2015-03-01 LAB — GLUCOSE, CAPILLARY: Glucose-Capillary: 94 mg/dL (ref 65–99)

## 2015-03-01 LAB — MAGNESIUM: MAGNESIUM: 2.1 mg/dL (ref 1.7–2.4)

## 2015-03-01 LAB — TSH: TSH: 1.316 u[IU]/mL (ref 0.350–4.500)

## 2015-03-01 LAB — TROPONIN I: Troponin I: <0.03 ng/mL (ref <0.031)

## 2015-03-01 MED ORDER — ENOXAPARIN SODIUM 40 MG/0.4ML ~~LOC~~ SOLN
40.0000 mg | SUBCUTANEOUS | Status: AC
  Administered 2015-03-01 – 2015-03-02 (×2): 40 mg via SUBCUTANEOUS
  Filled 2015-03-01 (×2): qty 0.4

## 2015-03-01 MED ORDER — ATORVASTATIN CALCIUM 10 MG PO TABS
10.0000 mg | ORAL_TABLET | Freq: Every day | ORAL | Status: DC
  Administered 2015-03-01 – 2015-03-03 (×3): 10 mg via ORAL
  Filled 2015-03-01 (×3): qty 1

## 2015-03-01 MED ORDER — SODIUM CHLORIDE 0.9 % IJ SOLN
3.0000 mL | Freq: Two times a day (BID) | INTRAMUSCULAR | Status: DC
  Administered 2015-03-01 – 2015-03-04 (×6): 3 mL via INTRAVENOUS

## 2015-03-01 MED ORDER — FOLIC ACID 1 MG PO TABS
1.0000 mg | ORAL_TABLET | Freq: Every day | ORAL | Status: DC
  Administered 2015-03-01 – 2015-03-04 (×4): 1 mg via ORAL
  Filled 2015-03-01 (×4): qty 1

## 2015-03-01 MED ORDER — SODIUM CHLORIDE 0.9 % IV SOLN
INTRAVENOUS | Status: AC
  Administered 2015-03-01: 03:00:00 via INTRAVENOUS

## 2015-03-01 MED ORDER — ACETAMINOPHEN 325 MG PO TABS: 650.0000 mg | ORAL_TABLET | Freq: Four times a day (QID) | ORAL | Status: DC | PRN

## 2015-03-01 MED ORDER — LORAZEPAM 1 MG PO TABS: 1.0000 mg | ORAL_TABLET | Freq: Four times a day (QID) | ORAL | Status: AC | PRN

## 2015-03-01 MED ORDER — VITAMIN B-1 100 MG PO TABS
100.0000 mg | ORAL_TABLET | Freq: Every day | ORAL | Status: DC
  Administered 2015-03-01 – 2015-03-04 (×4): 100 mg via ORAL
  Filled 2015-03-01 (×4): qty 1

## 2015-03-01 MED ORDER — LORAZEPAM 2 MG/ML IJ SOLN
1.0000 mg | Freq: Four times a day (QID) | INTRAMUSCULAR | Status: AC | PRN
Start: 2015-03-01 — End: 2015-03-04

## 2015-03-01 MED ORDER — ADULT MULTIVITAMIN W/MINERALS CH
1.0000 | ORAL_TABLET | Freq: Every day | ORAL | Status: DC
  Administered 2015-03-01 – 2015-03-03 (×3): 1 via ORAL
  Filled 2015-03-01 (×3): qty 1

## 2015-03-01 MED ORDER — ASPIRIN 325 MG PO TABS
325.0000 mg | ORAL_TABLET | Freq: Every day | ORAL | Status: DC
  Administered 2015-03-01 – 2015-03-04 (×4): 325 mg via ORAL
  Filled 2015-03-01 (×4): qty 1

## 2015-03-01 MED ORDER — ACETAMINOPHEN 650 MG RE SUPP: 650.0000 mg | Freq: Four times a day (QID) | RECTAL | Status: DC | PRN

## 2015-03-01 MED ORDER — THIAMINE HCL 100 MG/ML IJ SOLN
100.0000 mg | Freq: Every day | INTRAMUSCULAR | Status: DC
  Filled 2015-03-01: qty 2

## 2015-03-01 MED ORDER — FINASTERIDE 5 MG PO TABS
5.0000 mg | ORAL_TABLET | Freq: Every day | ORAL | Status: DC
  Administered 2015-03-01 – 2015-03-04 (×4): 5 mg via ORAL
  Filled 2015-03-01 (×4): qty 1

## 2015-03-01 NOTE — Progress Notes (Addendum)
[facility] [place] NAME: Derel Mcglasson    MR#:  [id number]  DATE OF BIRTH:  20-Jan-1928  SUBJECTIVE:  Doing well. Denies any dizziness  REVIEW OF SYSTEMS:   Review of Systems  Constitutional: Negative for fever, chills and weight loss.  HENT: Negative for ear discharge, ear pain and nosebleeds.   Eyes: Negative for blurred vision, pain and discharge.  Respiratory: Negative for sputum production, shortness of breath, wheezing and stridor.   Cardiovascular: Negative for chest pain, palpitations, orthopnea and PND.  Gastrointestinal: Negative for nausea, vomiting, abdominal pain and diarrhea.  Genitourinary: Negative for urgency and frequency.  Musculoskeletal: Negative for back pain and joint pain.  Neurological: Negative for sensory change, speech change, focal weakness and weakness.  Psychiatric/Behavioral: Negative for depression and hallucinations. The patient is not nervous/anxious.   All other systems reviewed and are negative.  Tolerating Diet:yes Tolerating PT: eval pending  DRUG ALLERGIES:  No Known Allergies  VITALS:  Blood pressure 142/66, pulse 62, temperature 97.8 F (36.6 C), temperature source Oral, resp. rate 24, height 6' (1.829 m), weight 90.719 kg (200 lb), SpO2 94 %.  PHYSICAL EXAMINATION:   Physical Exam  GENERAL:  81 y.o.-year-old patient lying in the bed with no acute distress.  EYES: Pupils equal, round, reactive to light and accommodation. No scleral icterus. Extraocular muscles intact.  HEENT: Head atraumatic, normocephalic. Oropharynx and nasopharynx clear.  NECK:  Supple, no jugular venous distention. No thyroid enlargement, no tenderness.  LUNGS: Normal breath sounds bilaterally, no wheezing, rales, rhonchi. No use of accessory muscles of respiration.  CARDIOVASCULAR: S1, S2 normal. No murmurs, rubs, or gallops.  ABDOMEN: Soft, nontender, nondistended. Bowel sounds present. No organomegaly or  mass.  EXTREMITIES: No cyanosis, clubbing or edema b/l.    NEUROLOGIC: Cranial nerves II through XII are intact. No focal Motor or sensory deficits b/l.   PSYCHIATRIC: The patient is alert and oriented x 3.  SKIN: No obvious rash, lesion, or ulcer.    LABORATORY PANEL:   CBC  Recent Labs Lab 03/01/15 0810  WBC 3.9  HGB 14.0  HCT 41.8  PLT 189    Chemistries   Recent Labs Lab 02/28/15 2046 03/01/15 0451 03/01/15 0810  NA 137  --  140  K 4.8  --  4.5  CL 104  --  105  CO2 26  --  29  GLUCOSE 101*  --  93  BUN 25*  --  19  CREATININE 1.10  --  0.83  CALCIUM 9.5  --  8.9  MG 2.2 2.1  --   AST 24  --   --   ALT 18  --   --   ALKPHOS 51  --   --   BILITOT 0.7  --   --     Cardiac Enzymes  Recent Labs Lab 03/01/15 0810  TROPONINI <0.03    RADIOLOGY:  Dg Chest Portable 1 View  02/28/2015   CLINICAL DATA:  Dizziness for 3 days, worse with standing up. Syncopal episode tonight. Hypotension.  EXAM: PORTABLE CHEST - 1 VIEW  COMPARISON:  None.  FINDINGS: Slightly shallow inspiration. The heart size and mediastinal contours are within normal limits. Both lungs are clear. The visualized skeletal structures are unremarkable.  IMPRESSION: No active disease.   Electronically Signed   By: Lucienne Capers M.D.   On: 02/28/2015 21:25     ASSESSMENT AND PLAN:  81 y.o. male who presents with Symptomatic bradycardia  and a syncopal episode. Patient states he's been feeling dizzy for the past 2-3 days. He was at home sitting down watching the Olympics , when he got up to go get some in the kitchen he had a syncopal episode   *Symptomatic bradycardia - Unclear etiology at this time. We'll hold his Procardia , this seems to be the only medication he is taking that might affect his heart rate.  - cardiac enzymes ok -consult with cardiology appreciated. Possible PM placement if continues with Bradycardia -HR 30-70's - echocardiogram. We'll keep him on telemetry.  * HTN  (hypertension) - Blood pressure was on the low side when he came to the ED, we'll hold his antihypertensive medicines for now, support his blood pressure with fluids.  * Alcohol use - We will use CIWA protocol to monitor for signs of withdrawal while he is here.  * HLD (hyperlipidemia) - Continue home statin  * BPH (benign prostatic hyperplasia) - Continue finasteride  Case discussed with Care Management/Social Worker. Management plans discussed with the patient, family and they are in agreement.  CODE STATUS: full  DVT Prophylaxis: heparin  TOTAL critical TIME TAKING CARE OF THIS PATIENT: 35 minutes.  >50% time spent on counselling and coordination of care with pt ad dr Ubaldo Glassing  POSSIBLE D/C IN 1-2DAYS, DEPENDING ON CLINICAL CONDITION.   Slayton Lubitz M.D on 03/01/2015 at 2:30 PM  Between 7am to 6pm - Pager - [phone]  After 6pm go to [email or web address] - password [email or web address] [facility]  Office  [phone]  CC: Primary care physician; Ezequiel Kayser, MD

## 2015-03-01 NOTE — Progress Notes (Signed)
Patient resting intermittently overnight, denies pain/ need.  HR is sinus brady with some pauses, blood pressure remains stable throughout. Patient given info on advance directive request to see chaplain later on today.  Will await cardiology consult.  See scm for further updates will continue to assess for changes/need.

## 2015-03-01 NOTE — Progress Notes (Signed)
*  PRELIMINARY RESULTS* Echocardiogram 2D Echocardiogram has been performed.  Christopher [name] 03/01/2015, 2:41 PM

## 2015-03-01 NOTE — Consult Note (Signed)
[facility]  CARDIOLOGY CONSULT NOTE  Patient ID: ETHIN DRUMMOND MRN: [id number] DOB/AGE: Apr 30, 1928 81 y.o.  Admit date: 02/28/2015 Referring Physician patel Primary Physician   Primary Cardiologist Callie Bunyard Reason for Consultation Bradycardia  HPI:  The the patient is an 81 year old male with no prior cardiac history.  He was on Procardia for blood pressure control.  Earlier this week he had a syncopal episode after standing up quickly.  He also had some lightheadedness and dizziness.  He presented to the emergency room where he had sinus bradycardia with variable rates in the upper 30s when sleeping.  He during waking hours his heart rate was in the lower 50s to mid 60s.  He denies any previous syncopal episodes.  He has no chest pain.  He is fairly active.  He denies any rapid or irregular heartbeat.  Procardia was  Stop.  Patient is on the stroke for some time.  He currently is in sinus rhythm to sinus bradycardia with no pauses or prolonged bradycardia while awake.  He does get bradycardic while sleeping likely consistent with possible sleep apnea.  ROS Review of Systems - General ROS: positive for  - Syncope  Respiratory ROS: no cough, shortness of breath, or wheezing Cardiovascular ROS: no chest pain or dyspnea on exertion Gastrointestinal ROS: no abdominal pain, change in bowel habits, or black or bloody stools Musculoskeletal ROS: negative Neurological ROS: positive for - dizziness and Syncope   Past Medical History  Diagnosis Date  . Hypertension   . HLD (hyperlipidemia)   . Gilbert disease   . Sensorineural hearing loss   . Osteopenia   . ED (erectile dysfunction)   . Macular degeneration   . BPH (benign prostatic hyperplasia)     Family History  Problem Relation Age of Onset  . Diabetes Mother   . Lung cancer Father   . Cancer Paternal Uncle     Social History   Social History  . Marital Status: Married    Spouse Name: N/A   . Number of Children: N/A  . Years of Education: N/A   Occupational History  . Not on file.   Social History Main Topics  . Smoking status: Never Smoker   . Smokeless tobacco: Not on file  . Alcohol Use: Yes  . Drug Use: No  . Sexual Activity: Not on file   Other Topics Concern  . Not on file   Social History Narrative  . No narrative on file    Past Surgical History  Procedure Laterality Date  . Knee arthroscopy    . Cataract extraction    . Tonsillectomy    . Hemorrhoidectomy with hemorrhoid banding       Prescriptions prior to admission  Medication Sig Dispense Refill Last Dose  . aspirin 325 MG tablet Take 1 tablet by mouth daily.   02/28/2015 at Unknown time  . atorvastatin (LIPITOR) 10 MG tablet Take 1 tablet by mouth daily.   02/28/2015 at Unknown time  . calcium carbonate (TUMS - DOSED IN MG ELEMENTAL CALCIUM) 500 MG chewable tablet Chew 1 tablet by mouth daily.   02/28/2015 at Unknown time  . Cholecalciferol (VITAMIN D3) 1000 UNITS CAPS Take 1 capsule by mouth daily.   02/28/2015 at Unknown time  . finasteride (PROSCAR) 5 MG tablet Take 1 tablet by mouth daily.   02/28/2015 at Unknown time  . hydrochlorothiazide (HYDRODIURIL) 25 MG tablet Take 12.5 mg by mouth daily.   02/28/2015 at Unknown  time  . lisinopril (PRINIVIL,ZESTRIL) 10 MG tablet Take 1 tablet by mouth daily.   02/28/2015 at Unknown time  . Multiple Vitamin (MULTI-VITAMINS) TABS Take 1 tablet by mouth daily.   02/28/2015 at Unknown time  . Multiple Vitamins-Minerals (OCUVITE PRESERVISION PO) Take 1 tablet by mouth 2 (two) times daily.   02/28/2015 at Unknown time  . NIFEdipine (PROCARDIA-XL/ADALAT CC) 30 MG 24 hr tablet Take 1 tablet by mouth daily.   02/28/2015 at Unknown time  . Omega-3 Fatty Acids (FISH OIL) 1000 MG CAPS Take 1 tablet by mouth 2 (two) times daily.   02/28/2015 at Unknown time  . tamsulosin (FLOMAX) 0.4 MG CAPS capsule Take 1 capsule by mouth daily.   02/28/2015 at Unknown time  . ZOSTAVAX 50722  UNT/0.65ML injection Inject 1 vial into the muscle once.   02/28/2015 at Unknown time    Physical Exam: Blood pressure 164/68, pulse 42, temperature 97.8 F (36.6 C), temperature source Oral, resp. rate 23, height 6' (1.829 m), weight 90.719 kg (200 lb), SpO2 95 %.   General appearance: alert and cooperative Resp: clear to auscultation bilaterally Cardio: S1, S2 normal and Sinus rhythm and sinus bradycardia GI: soft, non-tender; bowel sounds normal; no masses,  no organomegaly Extremities: extremities normal, atraumatic, no cyanosis or edema Pulses: 2+ and symmetric Neurologic: Grossly normal Labs:   Lab Results  Component Value Date   WBC 3.9 03/01/2015   HGB 14.0 03/01/2015   HCT 41.8 03/01/2015   MCV 95.4 03/01/2015   PLT 189 03/01/2015    Recent Labs Lab 02/28/15 2046 03/01/15 0810  NA 137 140  K 4.8 4.5  CL 104 105  CO2 26 29  BUN 25* 19  CREATININE 1.10 0.83  CALCIUM 9.5 8.9  PROT 6.6  --   BILITOT 0.7  --   ALKPHOS 51  --   ALT 18  --   AST 24  --   GLUCOSE 101* 93   Lab Results  Component Value Date   TROPONINI <0.03 03/01/2015      Radiology:   No acute process a EKG:  The sinus rhythm sinus bradycardia with no pauses  ASSESSMENT AND PLAN:  Patient with a history of no prior cardiac problems who was admitted with lightheadedness and a syncopal episode earlier this week.  He is currently stable.  Heart rate is in the 30s while sleeping however remains in the 50s to 60s currently.  Was in the 39s on  admission.  Patient is only on Procardia which has no significan AV nodal  Of fact.  He is hemodynamically stable at present etiology of his syncope is likely possibly due to his bradycardia although do not see profound episodes of bradycardia currently.  Will watch over the next 24 hours to make a determination regarding consideration for permanent pacemaker versus continued careful watching with an outpatient Holter monitor. Signed: Teodoro Spray MD,  [facility] 03/01/2015, 5:19 PM

## 2015-03-01 NOTE — Progress Notes (Signed)
°   03/01/15 0900  Clinical Encounter Type  Visited With Patient  Visit Type Initial  Referral From Nurse  Spiritual Encounters  Spiritual Needs Other (Comment) (Patient wanted Adv. Dir. Edu. will discuss with family)  Advance Directives (For Healthcare)  Would patient like information on creating an advanced directive? Yes - Scientist, clinical (histocompatibility and immunogenetics) given

## 2015-03-01 NOTE — Progress Notes (Signed)
Alert and oriented. HR low was 34/min at sleep.  HR 48-64/min while awake. Arouses easily with decreased HR. No c/o pain. Up to [facility] with minimal assist.  Steady on feet.  Denies dizziness.  No shOB. Lungs clear and diminished. Was seen by Dr Ubaldo Glassing.  Possibly perm pacemaker needed.  Given pamphlet on Biventricular pacer placement.  Prepare to transfer to room 260.

## 2015-03-02 LAB — CBC
HCT: 44.6 % (ref 40.0–52.0)
HEMOGLOBIN: 14.7 g/dL (ref 13.0–18.0)
MCH: 31.4 pg (ref 26.0–34.0)
MCHC: 32.9 g/dL (ref 32.0–36.0)
MCV: 95.5 fL (ref 80.0–100.0)
PLATELETS: 200 10*3/uL (ref 150–440)
RBC: 4.67 MIL/uL (ref 4.40–5.90)
RDW: 13.9 % (ref 11.5–14.5)
WBC: 5.2 10*3/uL (ref 3.8–10.6)

## 2015-03-02 LAB — BASIC METABOLIC PANEL
Anion gap: 6 (ref 5–15)
BUN: 18 mg/dL (ref 6–20)
CO2: 31 mmol/L (ref 22–32)
CREATININE: 0.97 mg/dL (ref 0.61–1.24)
Calcium: 9.5 mg/dL (ref 8.9–10.3)
Chloride: 102 mmol/L (ref 101–111)
Glucose, Bld: 94 mg/dL (ref 65–99)
Potassium: 4.6 mmol/L (ref 3.5–5.1)
SODIUM: 139 mmol/L (ref 135–145)

## 2015-03-02 LAB — PROTIME-INR
INR: 1.01
PROTHROMBIN TIME: 13.5 s (ref 11.4–15.0)

## 2015-03-02 MED ORDER — SODIUM CHLORIDE 0.9 % IR SOLN
80.0000 mg | Status: AC
  Filled 2015-03-02: qty 2

## 2015-03-02 MED ORDER — CHLORHEXIDINE GLUCONATE 4 % EX LIQD: 60.0000 mL | Freq: Once | CUTANEOUS | Status: DC

## 2015-03-02 MED ORDER — HYDRALAZINE HCL 20 MG/ML IJ SOLN: 10.0000 mg | Freq: Four times a day (QID) | INTRAMUSCULAR | Status: DC | PRN

## 2015-03-02 MED ORDER — HYDROCHLOROTHIAZIDE 12.5 MG PO CAPS
12.5000 mg | ORAL_CAPSULE | Freq: Every day | ORAL | Status: DC
  Administered 2015-03-02 – 2015-03-04 (×3): 12.5 mg via ORAL
  Filled 2015-03-02 (×3): qty 1

## 2015-03-02 MED ORDER — SODIUM CHLORIDE 0.9 % IV SOLN: INTRAVENOUS | Status: DC

## 2015-03-02 MED ORDER — CEFAZOLIN SODIUM-DEXTROSE 2-3 GM-% IV SOLR
2.0000 g | INTRAVENOUS | Status: AC
  Administered 2015-03-03: 2 g via INTRAVENOUS
  Filled 2015-03-02: qty 50

## 2015-03-02 NOTE — Progress Notes (Signed)
[place]  SUBJECTIVE: no complaints this am. Tele shows sinus bradycardia in high 30s to low 50s. No pauses   Filed Vitals:   03/01/15 1955 03/02/15 0024 03/02/15 0605 03/02/15 0719  BP: 166/66 140/77 160/58 158/56  Pulse: 57 40 43 37  Temp: 98.5 F (36.9 C)  97.9 F (36.6 C)   TempSrc: Oral  Oral   Resp: 16  16 18   Height:      Weight: 92.761 kg (204 lb 8 oz)     SpO2: 96% 95% 97% 98%    Intake/Output Summary (Last 24 hours) at 03/02/15 0748 Last data filed at 03/02/15 0427  Gross per 24 hour  Intake    825 ml  Output   1851 ml  Net  -1026 ml    LABS: Basic Metabolic Panel:  Recent Labs  02/28/15 2046 03/01/15 0451 03/01/15 0810  NA 137  --  140  K 4.8  --  4.5  CL 104  --  105  CO2 26  --  29  GLUCOSE 101*  --  93  BUN 25*  --  19  CREATININE 1.10  --  0.83  CALCIUM 9.5  --  8.9  MG 2.2 2.1  --   PHOS  --  3.1  --    Liver Function Tests:  Recent Labs  02/28/15 2046  AST 24  ALT 18  ALKPHOS 51  BILITOT 0.7  PROT 6.6  ALBUMIN 3.7   No results for input(s): LIPASE, AMYLASE in the last 72 hours. CBC:  Recent Labs  02/28/15 2046 03/01/15 0810  WBC 5.1 3.9  HGB 14.3 14.0  HCT 43.4 41.8  MCV 96.0 95.4  PLT 208 189   Cardiac Enzymes:  Recent Labs  03/01/15 0451 03/01/15 0810 03/01/15 1439  TROPONINI <0.03 <0.03 <0.03   BNP: Invalid input(s): POCBNP D-Dimer: No results for input(s): DDIMER in the last 72 hours. Hemoglobin A1C: No results for input(s): HGBA1C in the last 72 hours. Fasting Lipid Panel: No results for input(s): CHOL, HDL, LDLCALC, TRIG, CHOLHDL, LDLDIRECT in the last 72 hours. Thyroid Function Tests:  Recent Labs  03/01/15 0451  TSH 1.316   Anemia Panel: No results for input(s): VITAMINB12, FOLATE, FERRITIN, TIBC, IRON, RETICCTPCT in the last 72 hours.   Physical Exam: Blood pressure 158/56, pulse 37, temperature 97.9 F (36.6 C), temperature source Oral, resp. rate 18,  height 6' (1.829 m), weight 92.761 kg (204 lb 8 oz), SpO2 98 %. .  General appearance: alert and cooperative Resp: clear to auscultation bilaterally Cardio: sinus bradycardia GI: soft, non-tender; bowel sounds normal; no masses,  no organomegaly Extremities: extremities normal, atraumatic, no cyanosis or edema Pulses: 2+ and symmetric Neurologic: Grossly normal  TELEMETRY: Reviewed telemetry pt in sinus bradycardia:  ASSESSMENT AND PLAN:  Principal Problem:   Symptomatic bradycardia-apperas to have symptomatic sinus bradycardia with heart rates in the 40s-50s while awake. Had a syncopal episode. Discussed ppm with patient and he agrees to proceed. Will refer to Dr. Saralyn Pilar for placmenet of dual chamber ppm for symptomatic sinus bradycardia.  Active Problems:   Syncope, cardiogenic   HTN (hypertension)-controlled   HLD (hyperlipidemia)   BPH (benign prostatic hyperplasia)   Alcohol use    Teodoro Spray., MD, [facility] 03/02/2015 7:48 AM

## 2015-03-02 NOTE — Progress Notes (Signed)
   03/02/15 1100  Clinical Encounter Type  Visited With Patient  Visit Type Initial  Spiritual Encounters  Spiritual Needs Prayer  Stress Factors  Patient Stress Factors Family relationships;Health changes;Major life changes  Family Stress Factors Family relationships;Health changes;Major life changes  Visited patient. Discussed health changes with self & spouse (in ICU). Patient is optimistic with pace maker surgery scheduled for tomorrow. Will follow up this afternoon. Chap. Safaa Stingley G. [place]

## 2015-03-02 NOTE — Progress Notes (Signed)
[place] at [place] NAME: Christopher [name]    MR#:  [id number]  DATE OF BIRTH:  [birth year]  SUBJECTIVE:  Doing well.  REVIEW OF SYSTEMS:   Review of Systems  Constitutional: Negative for fever, chills and weight loss.  HENT: Negative for ear discharge, ear pain and nosebleeds.   Eyes: Negative for blurred vision, pain and discharge.  Respiratory: Negative for sputum production, shortness of breath, wheezing and stridor.   Cardiovascular: Negative for chest pain, palpitations, orthopnea and PND.  Gastrointestinal: Negative for nausea, vomiting, abdominal pain and diarrhea.  Genitourinary: Negative for urgency and frequency.  Musculoskeletal: Negative for back pain and joint pain.  Neurological: Negative for sensory change, speech change, focal weakness and weakness.  Psychiatric/Behavioral: Negative for depression and hallucinations. The patient is not nervous/anxious.   All other systems reviewed and are negative.  Tolerating Diet:yes Tolerating PT: eval pending  DRUG ALLERGIES:  No Known Allergies  VITALS:  Blood pressure 176/60, pulse 47, temperature 98.3 F (36.8 C), temperature source Oral, resp. rate 18, height 6' (1.829 m), weight 92.761 kg (204 lb 8 oz), SpO2 95 %.  PHYSICAL EXAMINATION:   Physical Exam  GENERAL:  81 y.o.-year-old patient lying in the bed with no acute distress.  EYES: Pupils equal, round, reactive to light and accommodation. No scleral icterus. Extraocular muscles intact.  HEENT: Head atraumatic, normocephalic. Oropharynx and nasopharynx clear.  NECK:  Supple, no jugular venous distention. No thyroid enlargement, no tenderness.  LUNGS: Normal breath sounds bilaterally, no wheezing, rales, rhonchi. No use of accessory muscles of respiration.  CARDIOVASCULAR: S1, S2 normal. No murmurs, rubs, or gallops.  ABDOMEN: Soft, nontender, nondistended. Bowel sounds present. No organomegaly or mass.   EXTREMITIES: No cyanosis, clubbing or edema b/l.    NEUROLOGIC: Cranial nerves II through XII are intact. No focal Motor or sensory deficits b/l.   PSYCHIATRIC: The patient is alert and oriented x 3.  SKIN: No obvious rash, lesion, or ulcer.    LABORATORY PANEL:   CBC  Recent Labs Lab 03/02/15 0757  WBC 5.2  HGB 14.7  HCT 44.6  PLT 200    Chemistries   Recent Labs Lab 02/28/15 2046 03/01/15 0451  03/02/15 0757  NA 137  --   < > 139  K 4.8  --   < > 4.6  CL 104  --   < > 102  CO2 26  --   < > 31  GLUCOSE 101*  --   < > 94  BUN 25*  --   < > 18  CREATININE 1.10  --   < > 0.97  CALCIUM 9.5  --   < > 9.5  MG 2.2 2.1  --   --   AST 24  --   --   --   ALT 18  --   --   --   ALKPHOS 51  --   --   --   BILITOT 0.7  --   --   --   < > = values in this interval not displayed.  Cardiac Enzymes  Recent Labs Lab 03/01/15 1439  TROPONINI <0.03    RADIOLOGY:  Dg Chest Portable 1 View  02/28/2015   CLINICAL DATA:  Dizziness for 3 days, worse with standing up. Syncopal episode tonight. Hypotension.  EXAM: PORTABLE CHEST - 1 VIEW  COMPARISON:  None.  FINDINGS: Slightly shallow inspiration. The heart size and mediastinal contours are within normal limits.  Both lungs are clear. The visualized skeletal structures are unremarkable.  IMPRESSION: No active disease.   Electronically Signed   By: Lucienne Capers M.D.   On: 02/28/2015 21:25     ASSESSMENT AND PLAN:  81 y.o. male who presents with Symptomatic bradycardia and a syncopal episode. Patient states he's been feeling dizzy for the past 2-3 days. He was at home sitting down watching the Olympics , when he got up to go get some in the kitchen he had a syncopal episode   *Symptomatic bradycardia  -hold his Procardia  - cardiac enzymes ok -consult with cardiology appreciated.  PM placement in am --HR 30-70's  * HTN (hypertension) - Blood pressure was on the low side when he came to the ED, we'll hold his  antihypertensive medicines for now, support his blood pressure with fluids.  * Alcohol use -  use CIWA protocol to monitor for signs of withdrawal while he is here.  * HLD (hyperlipidemia) - Continue home statin  * BPH (benign prostatic hyperplasia) - Continue finasteride  Case discussed with Care Management/Social Worker. Management plans discussed with the patient, family and they are in agreement.  CODE STATUS: full  DVT Prophylaxis: heparin  TOTAL critical TIME TAKING CARE OF THIS PATIENT: 35 minutes.  >50% time spent on counselling and coordination of care with pt ad dr Ubaldo Glassing  POSSIBLE D/C IN 1-2DAYS, DEPENDING ON CLINICAL CONDITION.   Christopher [name] M.D on 03/02/2015 at 3:01 PM  Between 7am to 6pm - Pager - [phone]  After 6pm go to [email or web address] - password [email or web address] [place] [facility]  Office  [phone]  CC: Primary care physician; Ezequiel Kayser, MD

## 2015-03-02 NOTE — Care Management Important Message (Signed)
Important Message  Patient Details  Name: Christopher [name] MRN: [id number] Date of Birth: 1927/12/15   Medicare Important Message Given:  Yes-second notification given    Juliann Pulse A Allmond 03/02/2015, 9:11 AM

## 2015-03-02 NOTE — Progress Notes (Signed)
Pacemaker cancelled today due to OR scheduling . WIll place tomorrow. Pt is currently stable with sinus bradycardia

## 2015-03-02 NOTE — Progress Notes (Signed)
BP was reported to MD patel. Hydralyzine 10 mg IV prn q6h for SBP <150 and HCTZ 12.5 mg daily ordered. No further orders.

## 2015-03-03 ENCOUNTER — Encounter
Admission: EM | Disposition: A | Discharge status: Home / Self Care | Payer: Self-pay | Source: Home / Self Care | Attending: Internal Medicine

## 2015-03-03 ENCOUNTER — Encounter: Payer: Self-pay | Admitting: Anesthesiology

## 2015-03-03 ENCOUNTER — Inpatient Hospital Stay: Payer: Medicare Other

## 2015-03-03 ENCOUNTER — Inpatient Hospital Stay: Payer: Medicare Other | Admitting: Registered Nurse

## 2015-03-03 HISTORY — PX: PACEMAKER INSERTION: SHX728

## 2015-03-03 IMAGING — CR DG CHEST 1V PORT
1 series · 1 of 1 positions shown · non-contrast
Comparison: None.

CLINICAL DATA: Post pacemaker placement

EXAM:
Portable exam [GR] hours compared [DATE]

[portable]
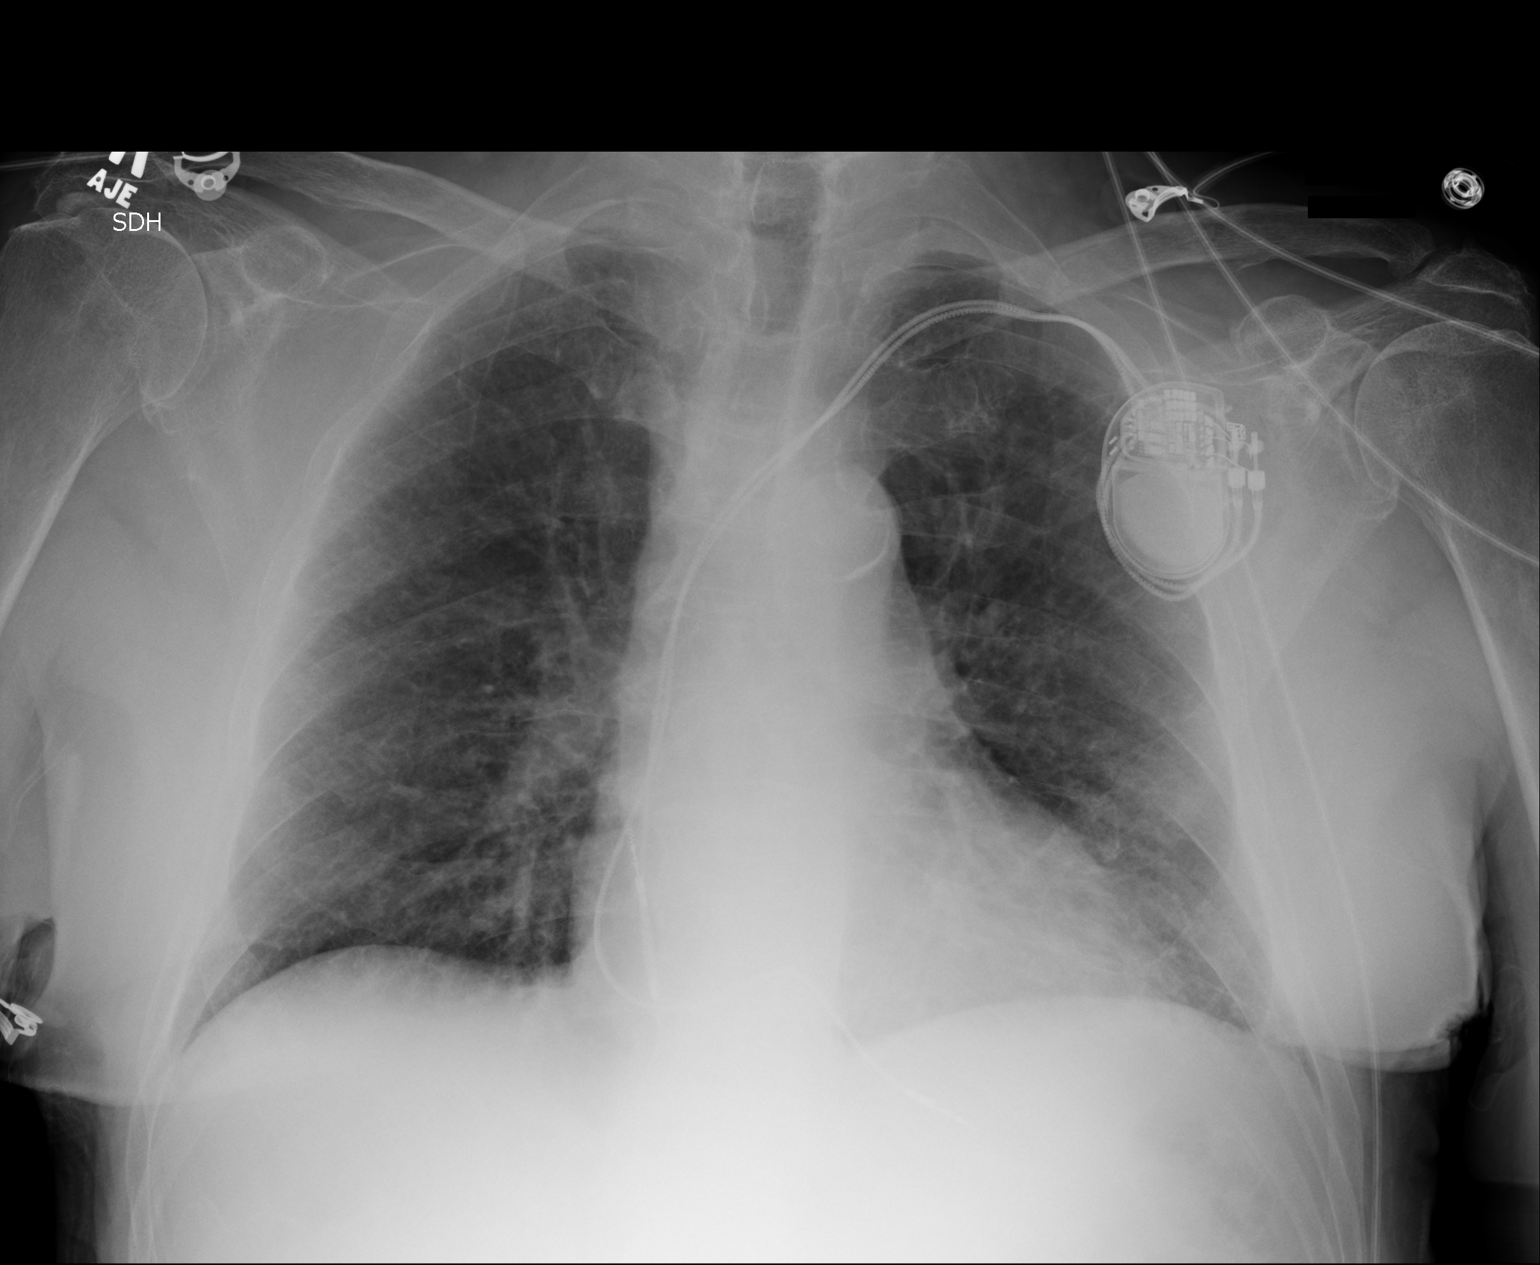

[1 of 1 positions shown; findings below may reference images not displayed]

FINDINGS: New LEFT subclavian transvenous pacemaker with leads projecting over
RIGHT atrium and RIGHT ventricle.

Normal heart size, mediastinal contours and pulmonary vascularity.

Atherosclerotic calcification aorta.

Minimal chronic bronchitic interstitial changes similar to previous
exam.

No definite acute infiltrate, pleural effusion, or pneumothorax.

Bones demineralized.
IMPRESSION: No acute abnormalities following placement of LEFT subclavian
pacemaker.

## 2015-03-03 SURGERY — INSERTION, CARDIAC PACEMAKER
Anesthesia: Monitor Anesthesia Care | Wound class: Clean

## 2015-03-03 MED ORDER — CEFAZOLIN SODIUM-DEXTROSE 2-3 GM-% IV SOLR
INTRAVENOUS | Status: DC | PRN
  Administered 2015-03-03: 2 g via INTRAVENOUS

## 2015-03-03 MED ORDER — LISINOPRIL 10 MG PO TABS
10.0000 mg | ORAL_TABLET | Freq: Every day | ORAL | Status: DC
  Administered 2015-03-03 – 2015-03-04 (×2): 10 mg via ORAL
  Filled 2015-03-03 (×4): qty 1

## 2015-03-03 MED ORDER — SODIUM CHLORIDE 0.9 % IR SOLN: 80.0000 mg | Status: DC

## 2015-03-03 MED ORDER — PROPOFOL INFUSION 10 MG/ML OPTIME
INTRAVENOUS | Status: DC | PRN
  Administered 2015-03-03: 75 ug/kg/min via INTRAVENOUS

## 2015-03-03 MED ORDER — LIDOCAINE 1 % OPTIME INJ - NO CHARGE
INTRAMUSCULAR | Status: DC | PRN
  Administered 2015-03-03: 30 mL

## 2015-03-03 MED ORDER — FENTANYL CITRATE (PF) 100 MCG/2ML IJ SOLN
INTRAMUSCULAR | Status: DC | PRN
  Administered 2015-03-03: 50 ug via INTRAVENOUS

## 2015-03-03 MED ORDER — ONDANSETRON HCL 4 MG/2ML IJ SOLN: 4.0000 mg | Freq: Once | INTRAMUSCULAR | Status: DC | PRN

## 2015-03-03 MED ORDER — LACTATED RINGERS IV SOLN
INTRAVENOUS | Status: DC | PRN
  Administered 2015-03-03: 12:00:00 via INTRAVENOUS

## 2015-03-03 MED ORDER — SODIUM CHLORIDE 0.9 % IR SOLN
Status: DC | PRN
  Administered 2015-03-03: 250 mL

## 2015-03-03 MED ORDER — FENTANYL CITRATE (PF) 100 MCG/2ML IJ SOLN: 25.0000 ug | INTRAMUSCULAR | Status: DC | PRN

## 2015-03-03 MED ORDER — HYDRALAZINE HCL 20 MG/ML IJ SOLN
10.0000 mg | Freq: Four times a day (QID) | INTRAMUSCULAR | Status: DC | PRN
  Filled 2015-03-03: qty 0.5

## 2015-03-03 MED ORDER — CEFAZOLIN SODIUM-DEXTROSE 2-3 GM-% IV SOLR
2.0000 g | INTRAVENOUS | Status: DC
  Filled 2015-03-03: qty 50

## 2015-03-03 MED ORDER — ONDANSETRON HCL 4 MG/2ML IJ SOLN: 4.0000 mg | Freq: Four times a day (QID) | INTRAMUSCULAR | Status: DC | PRN

## 2015-03-03 MED ORDER — ACETAMINOPHEN 325 MG PO TABS: 325.0000 mg | ORAL_TABLET | ORAL | Status: DC | PRN

## 2015-03-03 MED ORDER — PROPOFOL 10 MG/ML IV BOLUS
INTRAVENOUS | Status: DC | PRN
  Administered 2015-03-03 (×2): 50 mg via INTRAVENOUS

## 2015-03-03 MED ORDER — EPHEDRINE SULFATE 50 MG/ML IJ SOLN
INTRAMUSCULAR | Status: DC | PRN
  Administered 2015-03-03: 10 mg via INTRAVENOUS

## 2015-03-03 MED ORDER — CEFAZOLIN SODIUM 1-5 GM-% IV SOLN
1.0000 g | Freq: Four times a day (QID) | INTRAVENOUS | Status: AC
  Administered 2015-03-03 – 2015-03-04 (×3): 1 g via INTRAVENOUS
  Filled 2015-03-03 (×3): qty 50

## 2015-03-03 SURGICAL SUPPLY — 39 items
BAG DECANTER STRL (MISCELLANEOUS) ×3 IMPLANT
BRUSH SCRUB 4% CHG (MISCELLANEOUS) ×3 IMPLANT
CABLE SURG 12 DISP A/V CHANNEL (MISCELLANEOUS) ×3 IMPLANT
CANISTER SUCT 1200ML W/VALVE (MISCELLANEOUS) ×3 IMPLANT
CHLORAPREP W/TINT 26ML (MISCELLANEOUS) ×3 IMPLANT
CLOSURE WOUND 1/2 X4 (GAUZE/BANDAGES/DRESSINGS) ×1
COVER LIGHT HANDLE STERIS (MISCELLANEOUS) ×6 IMPLANT
COVER MAYO STAND STRL (DRAPES) ×3 IMPLANT
DRAPE C-ARM XRAY 36X54 (DRAPES) ×3 IMPLANT
DRESSING TELFA 4X3 1S ST N-ADH (GAUZE/BANDAGES/DRESSINGS) ×3 IMPLANT
DRSG TEGADERM 4X4.75 (GAUZE/BANDAGES/DRESSINGS) ×3 IMPLANT
DRSG TELFA 3X8 NADH (GAUZE/BANDAGES/DRESSINGS) ×3 IMPLANT
GLOVE BIO SURGEON STRL SZ7.5 (GLOVE) ×9 IMPLANT
GLOVE BIO SURGEON STRL SZ8 (GLOVE) ×3 IMPLANT
GOWN STRL REUS W/ TWL LRG LVL3 (GOWN DISPOSABLE) ×1 IMPLANT
GOWN STRL REUS W/ TWL XL LVL3 (GOWN DISPOSABLE) ×1 IMPLANT
GOWN STRL REUS W/TWL LRG LVL3 (GOWN DISPOSABLE) ×2
GOWN STRL REUS W/TWL XL LVL3 (GOWN DISPOSABLE) ×2
IMMOBILIZER SHDR MD LX WHT (SOFTGOODS) IMPLANT
IMMOBILIZER SHDR XL LX WHT (SOFTGOODS) ×3 IMPLANT
INTRO PACEMAKR LEAD 9FR 13CM (INTRODUCER) ×3
INTRO PACEMKR SHEATH II 7FR (MISCELLANEOUS) ×3
INTRODUCER PACEMKR LD 9FR 13CM (INTRODUCER) ×1 IMPLANT
INTRODUCER PACEMKR SHTH II 7FR (MISCELLANEOUS) ×1 IMPLANT
IV NS 500ML (IV SOLUTION) ×2
IV NS 500ML BAXH (IV SOLUTION) ×1 IMPLANT
KIT RM TURNOVER STRD PROC AR (KITS) ×3 IMPLANT
LABEL OR SOLS (LABEL) ×3 IMPLANT
LEAD CAPSURE NOVUS 5076-52CM (Lead) ×3 IMPLANT
LEAD CAPSURE NOVUS 5076-58CM (Lead) ×3 IMPLANT
MARKER SKIN W/RULER 31145785 (MISCELLANEOUS) ×3 IMPLANT
PACK PACE INSERTION (MISCELLANEOUS) ×3 IMPLANT
PAD GROUND ADULT SPLIT (MISCELLANEOUS) ×3 IMPLANT
PAD STATPAD (MISCELLANEOUS) IMPLANT
PPM ADVISA MRI DR A2DR01 (Pacemaker) ×3 IMPLANT
SPONGE XRAY 4X4 16PLY STRL (MISCELLANEOUS) ×3 IMPLANT
STRIP CLOSURE SKIN 1/2X4 (GAUZE/BANDAGES/DRESSINGS) ×2 IMPLANT
SUT SILK 0 SH 30 (SUTURE) ×9 IMPLANT
TOWEL OR 17X26 4PK STRL BLUE (TOWEL DISPOSABLE) ×3 IMPLANT

## 2015-03-03 NOTE — Progress Notes (Signed)
A & O. Pacemaker was placed to L subclavian. No complications. Room air. Daughter at the bedside. Ring and watch taken by daughter. Urinal. Arm in immoblizer. Dressing shows no bleeding. Pt to be d/c 8/25. Pt has no further concerns at this time.

## 2015-03-03 NOTE — Progress Notes (Signed)
[place] at [place] NAME: Christopher [name]    MR#:  [id number]  DATE OF BIRTH:  [birth year]  SUBJECTIVE:  Doing well. Awaiting PM placement  REVIEW OF SYSTEMS:   Review of Systems  Constitutional: Negative for fever, chills and weight loss.  HENT: Negative for ear discharge, ear pain and nosebleeds.   Eyes: Negative for blurred vision, pain and discharge.  Respiratory: Negative for sputum production, shortness of breath, wheezing and stridor.   Cardiovascular: Negative for chest pain, palpitations, orthopnea and PND.  Gastrointestinal: Negative for nausea, vomiting, abdominal pain and diarrhea.  Genitourinary: Negative for urgency and frequency.  Musculoskeletal: Negative for back pain and joint pain.  Neurological: Negative for sensory change, speech change, focal weakness and weakness.  Psychiatric/Behavioral: Negative for depression and hallucinations. The patient is not nervous/anxious.   All other systems reviewed and are negative.  Tolerating Diet:yes  DRUG ALLERGIES:  No Known Allergies  VITALS:  Blood pressure 176/73, pulse 67, temperature 98.5 F (36.9 C), temperature source Oral, resp. rate 22, height 6' (1.829 m), weight 92.761 kg (204 lb 8 oz), SpO2 98 %.  PHYSICAL EXAMINATION:   Physical Exam  GENERAL:  81 y.o.-year-old patient lying in the bed with no acute distress.  EYES: Pupils equal, round, reactive to light and accommodation. No scleral icterus. Extraocular muscles intact.  HEENT: Head atraumatic, normocephalic. Oropharynx and nasopharynx clear.  NECK:  Supple, no jugular venous distention. No thyroid enlargement, no tenderness.  LUNGS: Normal breath sounds bilaterally, no wheezing, rales, rhonchi. No use of accessory muscles of respiration.  CARDIOVASCULAR: S1, S2 normal. No murmurs, rubs, or gallops.  ABDOMEN: Soft, nontender, nondistended. Bowel sounds present. No organomegaly or mass.  EXTREMITIES: No  cyanosis, clubbing or edema b/l.    NEUROLOGIC: Cranial nerves II through XII are intact. No focal Motor or sensory deficits b/l.   PSYCHIATRIC: The patient is alert and oriented x 3.  SKIN: No obvious rash, lesion, or ulcer.    LABORATORY PANEL:   CBC  Recent Labs Lab 03/02/15 0757  WBC 5.2  HGB 14.7  HCT 44.6  PLT 200    Chemistries   Recent Labs Lab 02/28/15 2046 03/01/15 0451  03/02/15 0757  NA 137  --   < > 139  K 4.8  --   < > 4.6  CL 104  --   < > 102  CO2 26  --   < > 31  GLUCOSE 101*  --   < > 94  BUN 25*  --   < > 18  CREATININE 1.10  --   < > 0.97  CALCIUM 9.5  --   < > 9.5  MG 2.2 2.1  --   --   AST 24  --   --   --   ALT 18  --   --   --   ALKPHOS 51  --   --   --   BILITOT 0.7  --   --   --   < > = values in this interval not displayed.  Cardiac Enzymes  Recent Labs Lab 03/01/15 1439  TROPONINI <0.03    RADIOLOGY:  Dg Chest Port 1 View  03/03/2015   CLINICAL DATA:  Post pacemaker placement  EXAM: Portable exam 1336 hours compared 02/28/2015  COMPARISON:  None.  FINDINGS: New LEFT subclavian transvenous pacemaker with leads projecting over RIGHT atrium and RIGHT ventricle.  Normal heart size, mediastinal contours and pulmonary  vascularity.  Atherosclerotic calcification aorta.  Minimal chronic bronchitic interstitial changes similar to previous exam.  No definite acute infiltrate, pleural effusion, or pneumothorax.  Bones demineralized.  IMPRESSION: No acute abnormalities following placement of LEFT subclavian pacemaker.   Electronically Signed   By: Lavonia Dana M.D.   On: 03/03/2015 13:46   Dg C-arm 1-60 Min-no Report  03/03/2015   CLINICAL DATA: pacemaker insertion   C-ARM 1-60 MINUTES  Fluoroscopy was utilized by the requesting physician.  No radiographic  interpretation.      ASSESSMENT AND PLAN:  81 y.o. male who presents with Symptomatic bradycardia and a syncopal episode. Patient states he's been feeling dizzy for the past 2-3 days. He  was at home sitting down watching the Olympics , when he got up to go get some in the kitchen he had a syncopal episode   *Symptomatic bradycardia  -hold his Procardia  - cardiac enzymes ok -consult with cardiology appreciated.  PM placement today --HR 30-70's  * HTN (hypertension) -resume BP meds  * Alcohol use -  use CIWA protocol to monitor for signs of withdrawal while he is here.  * HLD (hyperlipidemia) - Continue home statin  * BPH (benign prostatic hyperplasia) - Continue finasteride  Case discussed with Care Management/Social Worker. Management plans discussed with the patient, family and they are in agreement.  CODE STATUS: full  DVT Prophylaxis: heparin  TOTAL critical TIME TAKING CARE OF THIS PATIENT: 35 minutes.  >50% time spent on counselling and coordination of care with pt ad dr Ubaldo Glassing  POSSIBLE D/C IN 1-2DAYS, DEPENDING ON CLINICAL CONDITION.   Tecia Cinnamon M.D on 03/03/2015 at 1:54 PM  Between 7am to 6pm - Pager - [phone]  After 6pm go to [email or web address] - password [email or web address]  Office  [phone]  CC: Primary care physician; Ezequiel Kayser, MD

## 2015-03-03 NOTE — Anesthesia Procedure Notes (Signed)
Date/Time: 03/03/2015 12:10 PM Performed by: Doreen Salvage Pre-anesthesia Checklist: Patient identified, Emergency Drugs available, Suction available and Patient being monitored Patient Re-evaluated:Patient Re-evaluated prior to inductionOxygen Delivery Method: Nasal cannula

## 2015-03-03 NOTE — Anesthesia Preprocedure Evaluation (Signed)
Anesthesia Evaluation  Patient identified by MRN, date of birth, ID band Patient awake    Reviewed: Allergy & Precautions, NPO status , Patient's Chart, lab work & pertinent test results, reviewed documented beta blocker date and time   Airway Mallampati: II  TM Distance: >3 FB     Dental  (+) Chipped   Pulmonary          Cardiovascular hypertension, Pt. on medications     Neuro/Psych    GI/Hepatic   Endo/Other    Renal/GU      Musculoskeletal   Abdominal   Peds  Hematology   Anesthesia Other Findings   Reproductive/Obstetrics                             Anesthesia Physical Anesthesia Plan  ASA: III  Anesthesia Plan: MAC   Post-op Pain Management:    Induction:   Airway Management Planned:   Additional Equipment:   Intra-op Plan:   Post-operative Plan:   Informed Consent: I have reviewed the patients History and Physical, chart, labs and discussed the procedure including the risks, benefits and alternatives for the proposed anesthesia with the patient or authorized representative who has indicated his/her understanding and acceptance.     Plan Discussed with: CRNA  Anesthesia Plan Comments:         Anesthesia Quick Evaluation

## 2015-03-03 NOTE — Progress Notes (Signed)
Pt awakens, nasal trumpet removed intact.

## 2015-03-03 NOTE — Transfer of Care (Signed)
Immediate Anesthesia Transfer of Care Note  Patient: Christopher [name]  Procedure(s) Performed: Procedure(s): INSERTION DUAL LEAD PACEMAKER (N/A)  Patient Location: PACU  Anesthesia Type:General  Level of Consciousness: sedated  Airway & Oxygen Therapy: Patient Spontanous Breathing and Patient connected to face mask oxygen  Post-op Assessment: Report given to RN and Post -op Vital signs reviewed and stable  Post vital signs: Reviewed and stable  Last Vitals:  Filed Vitals:   03/03/15 1319  BP: 151/65  Pulse: 63  Temp: 36.9 C  Resp: 13    Complications: No apparent anesthesia complications

## 2015-03-03 NOTE — Anesthesia Postprocedure Evaluation (Signed)
  Anesthesia Post-op Note  Patient: Christopher [name]  Procedure(s) Performed: Procedure(s): INSERTION DUAL LEAD PACEMAKER (N/A)  Anesthesia type:MAC  Patient location: PACU  Post pain: Pain level controlled  Post assessment: Post-op Vital signs reviewed, Patient's Cardiovascular Status Stable, Respiratory Function Stable, Patent Airway and No signs of Nausea or vomiting  Post vital signs: Reviewed and stable  Last Vitals:  Filed Vitals:   03/03/15 1442  BP: 156/59  Pulse: 63  Temp: 36.3 C  Resp: 18    Level of consciousness: awake, [name]  and patient cooperative  Complications: No apparent anesthesia complications

## 2015-03-03 NOTE — Op Note (Signed)
[facility] Card[facility]   02/28/2015 - 03/03/2015                     1:17 PM  PATIENT:  Christopher [name]    PRE-OPERATIVE DIAGNOSIS:  SYMPTOMATIC BRADYCARDIA  POST-OPERATIVE DIAGNOSIS:  Same  PROCEDURE:  INSERTION DUAL LEAD PACEMAKER  SURGEON:  Maigen Mozingo, MD    ANESTHESIA:     PREOPERATIVE INDICATIONS:  Christopher [name] is a  81 y.o. male with a diagnosis of SYMPTOMATIC BRADYCARDIA who failed conservative measures and elected for surgical management.    The risks benefits and alternatives were discussed with the patient preoperatively including but not limited to the risks of infection, bleeding, cardiopulmonary complications, the need for revision surgery, among others, and the patient was willing to proceed.   OPERATIVE PROCEDURE: The patient was brought to the operating room the fasting state. The left pectoral region was prepped and draped in the usual sterile manner. Anesthesia was obtained with 1% Xylocaine locally. A 6 cm incision was performed over the left pectoral region. The pacemaker pocket was generated by electrocautery and blunt dissection. Access was obtained to left subclavian vein by fine needle aspiration. MRI compatible leads were positioned into the right ventricular apical septum and right atrial appendage under fluoroscopic guidance. After proper thresholds were obtained the leads were sutured in place. Pacemaker pocket was irrigated with gentamicin solution. The leads were connected to a MRI compatible, dual-chamber rate responsive pacemaker generator ( [employer]visa  DR MR,I  [id number] 1 ) and positioned into the pocket. Pocket was closed with 20 and 40 Vicryls, respectively. Steri-Strips and a pressure dressing were applied. There were no procedural complications.

## 2015-03-03 NOTE — Progress Notes (Signed)
Pt arrives to PACU with nasal trumpet to left nostril.

## 2015-03-04 ENCOUNTER — Encounter: Payer: Self-pay | Admitting: Cardiology

## 2015-03-04 MED ORDER — CEPHALEXIN 500 MG PO CAPS
500.0000 mg | ORAL_CAPSULE | Freq: Two times a day (BID) | ORAL | Status: DC
  Administered 2015-03-04: 500 mg via ORAL
  Filled 2015-03-04: qty 1

## 2015-03-04 MED ORDER — LISINOPRIL 20 MG PO TABS: 10.0000 mg | ORAL_TABLET | Freq: Every day | ORAL | Status: DC

## 2015-03-04 MED ORDER — CEPHALEXIN 500 MG PO CAPS: 500.0000 mg | ORAL_CAPSULE | Freq: Two times a day (BID) | ORAL | Status: DC

## 2015-03-04 MED ORDER — HYDROCHLOROTHIAZIDE 25 MG PO TABS: 25.0000 mg | ORAL_TABLET | Freq: Every day | ORAL | Status: DC

## 2015-03-04 NOTE — Care Management Important Message (Signed)
Important Message  Patient Details  Name: Christopher [name] MRN: [id number] Date of Birth: 04-04-1928   Medicare Important Message Given:  Yes-third notification given    Darius Bump Allmond 03/04/2015, 9:39 AM

## 2015-03-04 NOTE — Progress Notes (Signed)
   03/04/15 0900  Clinical Encounter Type  Visited With Patient and family together  Visit Type Initial  Referral From Nurse  Consult/Referral To Chaplain  Spiritual Encounters  Spiritual Needs Literature  Advance Directives (For Healthcare)  Does patient have an advance directive? No  Would patient like information on creating an advanced directive? Yes - Scientist, clinical (histocompatibility and immunogenetics) given  Provided pastoral support and Forensic scientist education and materials.  Pt being discharged so will complete somewhere else.  [place] [phone]

## 2015-03-04 NOTE — Progress Notes (Signed)
Christopher [name]  SUBJECTIVE: Doing well post ppm. A pace, v sense. Site intact with mild edema.    Filed Vitals:   03/03/15 1442 03/03/15 2031 03/04/15 0516 03/04/15 0724  BP: 156/59 175/61 171/65 144/65  Pulse: 63 58 62 59  Temp: 97.4 F (36.3 C) 97.7 F (36.5 C) 98.4 F (36.9 C)   TempSrc: Oral Oral Oral   Resp: 18 18 18 18   Height:      Weight:      SpO2: 95% 97% 96%     Intake/Output Summary (Last 24 hours) at 03/04/15 0845 Last data filed at 03/04/15 0800  Gross per 24 hour  Intake   1330 ml  Output   3250 ml  Net  -1920 ml    LABS: Basic Metabolic Panel:  Recent Labs  03/02/15 0757  NA 139  K 4.6  CL 102  CO2 31  GLUCOSE 94  BUN 18  CREATININE 0.97  CALCIUM 9.5   Liver Function Tests: No results for input(s): AST, ALT, ALKPHOS, BILITOT, PROT, ALBUMIN in the last 72 hours. No results for input(s): LIPASE, AMYLASE in the last 72 hours. CBC:  Recent Labs  03/02/15 0757  WBC 5.2  HGB 14.7  HCT 44.6  MCV 95.5  PLT 200   Cardiac Enzymes:  Recent Labs  03/01/15 1439  TROPONINI <0.03   BNP: Invalid input(s): POCBNP D-Dimer: No results for input(s): DDIMER in the last 72 hours. Hemoglobin A1C: No results for input(s): HGBA1C in the last 72 hours. Fasting Lipid Panel: No results for input(s): CHOL, HDL, LDLCALC, TRIG, CHOLHDL, LDLDIRECT in the last 72 hours. Thyroid Function Tests: No results for input(s): TSH, T4TOTAL, T3FREE, THYROIDAB in the last 72 hours.  Invalid input(s): FREET3 Anemia Panel: No results for input(s): VITAMINB12, FOLATE, FERRITIN, TIBC, IRON, RETICCTPCT in the last 72 hours.   Physical Exam: Blood pressure 144/65, pulse 59, temperature 98.4 F (36.9 C), temperature source Oral, resp. rate 18, height 6' (1.829 m), weight 92.761 kg (204 lb 8 oz), SpO2 96 %.    General appearance: alert and cooperative Resp: clear to auscultation bilaterally Chest wall: left sided chest wall  tenderness, secondary to ppm placement. Site intact.  Cardio: a paced, v sense GI: soft, non-tender; bowel sounds normal; no masses,  no organomegaly Neurologic: Grossly normal  TELEMETRY: Reviewed telemetry pt in a paced, v sense.   ASSESSMENT AND PLAN:  Principal Problem:   Symptomatic bradycardia-likely due to bradycardia. S/P ppm which is functioning normally.  Active Problems:   Syncope, cardiogenic   HTN (hypertension)   HLD (hyperlipidemia)   BPH (benign prostatic hyperplasia)   Alcohol use   Resume previous home meds and ok for discharge today.  Teodoro Spray., MD, [facility] 03/04/2015 8:45 AM

## 2015-03-04 NOTE — Discharge Summary (Signed)
[facility] NAME: Christopher [name]    MR#:  [id number]  DATE OF BIRTH:  Dec 07, 1927  DATE OF ADMISSION:  02/28/2015 ADMITTING PHYSICIAN: Lance Coon, MD  DATE OF DISCHARGE: 03/04/15  PRIMARY CARE PHYSICIAN: Ezequiel Kayser, MD    ADMISSION DIAGNOSIS:  Symptomatic bradycardia [R00.1] Syncope, cardiogenic [R55] Alcohol intoxication, with unspecified complication [D63.875] Hypotension, unspecified hypotension type [I95.9]  DISCHARGE DIAGNOSIS:  Symptomatic Bradycardia s/p PM placement  SECONDARY DIAGNOSIS:   Past Medical History  Diagnosis Date  . Hypertension   . HLD (hyperlipidemia)   . Gilbert disease   . Sensorineural hearing loss   . Osteopenia   . ED (erectile dysfunction)   . Macular degeneration   . BPH (benign prostatic hyperplasia)     HOSPITAL COURSE:   81 y.o. male who presents with Symptomatic bradycardia and a syncopal episode. Patient states he's been feeling dizzy for the past 2-3 days. He was at home sitting down watching the Olympics , when he got up to go get some in the kitchen he had a syncopal episode   *Symptomatic bradycardia  -d/ced his Procardia  - cardiac enzymes ok -consult with cardiology appreciated. PM placement on 03/03/2015 --HR 50-60's paced  * HTN (hypertension) -resume BP meds -increased lisinopril to 30 mg and HCTZ to 25 mg qd  * HLD (hyperlipidemia) - Continue home statin  * BPH (benign prostatic hyperplasia) - Continue finasteride D/c home today DISCHARGE CONDITIONS:   fair CONSULTS OBTAINED:  Treatment Team:  Teodoro Spray, MD Isaias Cowman, MD  DRUG ALLERGIES:  No Known Allergies  DISCHARGE MEDICATIONS:   Current Discharge Medication List    START taking these medications   Details  cephALEXin (KEFLEX) 500 MG capsule Take 1 capsule (500 mg total) by mouth every 12 (twelve) hours. Qty: 20 capsule, Refills: 0      CONTINUE these medications  which have CHANGED   Details  hydrochlorothiazide (HYDRODIURIL) 25 MG tablet Take 1 tablet (25 mg total) by mouth daily. Qty: 30 tablet, Refills: 0    lisinopril (PRINIVIL,ZESTRIL) 20 MG tablet Take 0.5 tablets (10 mg total) by mouth daily. Qty: 30 tablet, Refills: 0      CONTINUE these medications which have NOT CHANGED   Details  aspirin 325 MG tablet Take 1 tablet by mouth daily.    atorvastatin (LIPITOR) 10 MG tablet Take 1 tablet by mouth daily.    calcium carbonate (TUMS - DOSED IN MG ELEMENTAL CALCIUM) 500 MG chewable tablet Chew 1 tablet by mouth daily.    Cholecalciferol (VITAMIN D3) 1000 UNITS CAPS Take 1 capsule by mouth daily.    finasteride (PROSCAR) 5 MG tablet Take 1 tablet by mouth daily.    Multiple Vitamin (MULTI-VITAMINS) TABS Take 1 tablet by mouth daily.    Multiple Vitamins-Minerals (OCUVITE PRESERVISION PO) Take 1 tablet by mouth 2 (two) times daily.    Omega-3 Fatty Acids (FISH OIL) 1000 MG CAPS Take 1 tablet by mouth 2 (two) times daily.    tamsulosin (FLOMAX) 0.4 MG CAPS capsule Take 1 capsule by mouth daily.    ZOSTAVAX 64332 UNT/0.65ML injection Inject 1 vial into the muscle once.      STOP taking these medications     NIFEdipine (PROCARDIA-XL/ADALAT CC) 30 MG 24 hr tablet         If you experience worsening of your admission symptoms, develop shortness of breath, life threatening emergency, suicidal or homicidal thoughts you must seek medical attention  immediately by calling 911 or calling your MD immediately  if symptoms less severe.  You Must read complete instructions/literature along with all the possible adverse reactions/side effects for all the Medicines you take and that have been prescribed to you. Take any new Medicines after you have completely understood and accept all the possible adverse reactions/side effects.   Please note  You were cared for by a hospitalist during your hospital stay. If you have any questions about your  discharge medications or the care you received while you were in the hospital after you are discharged, you can call the unit and asked to speak with the hospitalist on call if the hospitalist that took care of you is not available. Once you are discharged, your primary care physician will handle any further medical issues. Please note that NO REFILLS for any discharge medications will be authorized once you are discharged, as it is imperative that you return to your primary care physician (or establish a relationship with a primary care physician if you do not have one) for your aftercare needs so that they can reassess your need for medications and monitor your lab values. Today   SUBJECTIVE   Doing well  VITAL SIGNS:  Blood pressure 144/65, pulse 59, temperature 98.4 F (36.9 C), temperature source Oral, resp. rate 18, height 6' (1.829 m), weight 92.761 kg (204 lb 8 oz), SpO2 96 %.  I/O:   Intake/Output Summary (Last 24 hours) at 03/04/15 0832 Last data filed at 03/04/15 0800  Gross per 24 hour  Intake   1330 ml  Output   3550 ml  Net  -2220 ml    PHYSICAL EXAMINATION:  GENERAL:  81 y.o.-year-old patient lying in the bed with no acute distress.  EYES: Pupils equal, round, reactive to light and accommodation. No scleral icterus. Extraocular muscles intact.  HEENT: Head atraumatic, normocephalic. Oropharynx and nasopharynx clear.  NECK:  Supple, no jugular venous distention. No thyroid enlargement, no tenderness.  LUNGS: Normal breath sounds bilaterally, no wheezing, rales,rhonchi or crepitation. No use of accessory muscles of respiration.  CARDIOVASCULAR: S1, S2 normal. No murmurs, rubs, or gallops.  PM + left upper chest. Site looks ok ABDOMEN: Soft, non-tender, non-distended. Bowel sounds present. No organomegaly or mass.  EXTREMITIES: No pedal edema, cyanosis, or clubbing.  NEUROLOGIC: Cranial nerves II through XII are intact. Muscle strength 5/5 in all extremities. Sensation  intact. Gait not checked.  PSYCHIATRIC: The patient is alert and oriented x 3.  SKIN: No obvious rash, lesion, or ulcer.   DATA REVIEW:   CBC   Recent Labs Lab 03/02/15 0757  WBC 5.2  HGB 14.7  HCT 44.6  PLT 200    Chemistries   Recent Labs Lab 02/28/15 2046 03/01/15 0451  03/02/15 0757  NA 137  --   < > 139  K 4.8  --   < > 4.6  CL 104  --   < > 102  CO2 26  --   < > 31  GLUCOSE 101*  --   < > 94  BUN 25*  --   < > 18  CREATININE 1.10  --   < > 0.97  CALCIUM 9.5  --   < > 9.5  MG 2.2 2.1  --   --   AST 24  --   --   --   ALT 18  --   --   --   ALKPHOS 51  --   --   --  BILITOT 0.7  --   --   --   < > = values in this interval not displayed.  Microbiology Results   No results found for this or any previous visit (from the past 240 hour(s)).  RADIOLOGY:  Dg Chest Port 1 View  03/03/2015   CLINICAL DATA:  Post pacemaker placement  EXAM: Portable exam 1336 hours compared 02/28/2015  COMPARISON:  None.  FINDINGS: New LEFT subclavian transvenous pacemaker with leads projecting over RIGHT atrium and RIGHT ventricle.  Normal heart size, mediastinal contours and pulmonary vascularity.  Atherosclerotic calcification aorta.  Minimal chronic bronchitic interstitial changes similar to previous exam.  No definite acute infiltrate, pleural effusion, or pneumothorax.  Bones demineralized.  IMPRESSION: No acute abnormalities following placement of LEFT subclavian pacemaker.   Electronically Signed   By: Lavonia Dana M.D.   On: 03/03/2015 13:46   Dg C-arm 1-60 Min-no Report  03/03/2015   CLINICAL DATA: pacemaker insertion   C-ARM 1-60 MINUTES  Fluoroscopy was utilized by the requesting physician.  No radiographic  interpretation.      Management plans discussed with the patient, family and they are in agreement.  CODE STATUS:     Code Status Orders        Start     Ordered   03/03/15 1445  Full code   Continuous     03/03/15 1444      TOTAL TIME TAKING CARE OF THIS  PATIENT: 40 minutes.    Amman Bartel M.D on 03/04/2015 at 8:32 AM  Between 7am to 6pm - Pager - [phone] After 6pm go to [email or web address] - password [email or web address] [facility]  Office  [phone]  CC: Primary care physician; Ezequiel Kayser, MD

## 2015-03-10 DIAGNOSIS — R001 Bradycardia, unspecified: Secondary | ICD-10-CM | POA: Insufficient documentation

## 2015-03-10 DIAGNOSIS — R42 Dizziness and giddiness: Secondary | ICD-10-CM | POA: Insufficient documentation

## 2015-06-14 DIAGNOSIS — M545 Low back pain, unspecified: Secondary | ICD-10-CM | POA: Insufficient documentation

## 2015-06-14 DIAGNOSIS — G8929 Other chronic pain: Secondary | ICD-10-CM | POA: Insufficient documentation

## 2015-10-02 ENCOUNTER — Encounter: Payer: Self-pay | Admitting: Emergency Medicine

## 2015-10-02 ENCOUNTER — Emergency Department
Admission: EM | Admit: 2015-10-02 | Discharge: 2015-10-02 | Disposition: A | Discharge status: Home / Self Care | Payer: Medicare Other | Attending: Emergency Medicine | Admitting: Emergency Medicine

## 2015-10-02 DIAGNOSIS — E785 Hyperlipidemia, unspecified: Secondary | ICD-10-CM | POA: Diagnosis not present

## 2015-10-02 DIAGNOSIS — N400 Enlarged prostate without lower urinary tract symptoms: Secondary | ICD-10-CM | POA: Insufficient documentation

## 2015-10-02 DIAGNOSIS — Z7982 Long term (current) use of aspirin: Secondary | ICD-10-CM | POA: Insufficient documentation

## 2015-10-02 DIAGNOSIS — Z950 Presence of cardiac pacemaker: Secondary | ICD-10-CM | POA: Diagnosis not present

## 2015-10-02 DIAGNOSIS — Z79899 Other long term (current) drug therapy: Secondary | ICD-10-CM | POA: Diagnosis not present

## 2015-10-02 DIAGNOSIS — R42 Dizziness and giddiness: Secondary | ICD-10-CM | POA: Diagnosis present

## 2015-10-02 DIAGNOSIS — I10 Essential (primary) hypertension: Secondary | ICD-10-CM | POA: Insufficient documentation

## 2015-10-02 DIAGNOSIS — R55 Syncope and collapse: Secondary | ICD-10-CM | POA: Diagnosis not present

## 2015-10-02 DIAGNOSIS — Z792 Long term (current) use of antibiotics: Secondary | ICD-10-CM | POA: Insufficient documentation

## 2015-10-02 LAB — URINALYSIS COMPLETE WITH MICROSCOPIC (ARMC ONLY)
BILIRUBIN URINE: NEGATIVE
Bacteria, UA: NONE SEEN
Glucose, UA: NEGATIVE mg/dL
Hgb urine dipstick: NEGATIVE
Ketones, ur: NEGATIVE mg/dL
Leukocytes, UA: NEGATIVE
Nitrite: NEGATIVE
PH: 5 (ref 5.0–8.0)
Protein, ur: NEGATIVE mg/dL
Specific Gravity, Urine: 1.017 (ref 1.005–1.030)

## 2015-10-02 LAB — CBC
HCT: 42.6 % (ref 40.0–52.0)
HEMOGLOBIN: 14.6 g/dL (ref 13.0–18.0)
MCH: 32 pg (ref 26.0–34.0)
MCHC: 34.3 g/dL (ref 32.0–36.0)
MCV: 93.1 fL (ref 80.0–100.0)
PLATELETS: 190 10*3/uL (ref 150–440)
RBC: 4.57 MIL/uL (ref 4.40–5.90)
RDW: 13.4 % (ref 11.5–14.5)
WBC: 5.6 10*3/uL (ref 3.8–10.6)

## 2015-10-02 LAB — COMPREHENSIVE METABOLIC PANEL
ALBUMIN: 3.8 g/dL (ref 3.5–5.0)
ALK PHOS: 55 U/L (ref 38–126)
ALT: 18 U/L (ref 17–63)
ANION GAP: 6 (ref 5–15)
AST: 21 U/L (ref 15–41)
BUN: 27 mg/dL — ABNORMAL HIGH (ref 6–20)
CALCIUM: 9.4 mg/dL (ref 8.9–10.3)
CHLORIDE: 104 mmol/L (ref 101–111)
CO2: 27 mmol/L (ref 22–32)
Creatinine, Ser: 0.96 mg/dL (ref 0.61–1.24)
GFR calc Af Amer: >60 mL/min (ref >60)
GFR calc non Af Amer: >60 mL/min (ref >60)
GLUCOSE: 99 mg/dL (ref 65–99)
Potassium: 3.9 mmol/L (ref 3.5–5.1)
SODIUM: 137 mmol/L (ref 135–145)
Total Bilirubin: 1.4 mg/dL — ABNORMAL HIGH (ref 0.3–1.2)
Total Protein: 7.3 g/dL (ref 6.5–8.1)

## 2015-10-02 LAB — TROPONIN I: Troponin I: <0.03 ng/mL (ref <0.031)

## 2015-10-02 MED ORDER — SODIUM CHLORIDE 0.9 % IV BOLUS (SEPSIS)
500.0000 mL | Freq: Once | INTRAVENOUS | Status: AC
  Administered 2015-10-02: 500 mL via INTRAVENOUS

## 2015-10-02 NOTE — ED Notes (Signed)
Pt able to ambulate w/o difficulty.  Pt denies dizziness.

## 2015-10-02 NOTE — ED Notes (Signed)
States has been dizzy x 2 days. Fell 2 days ago, unsure if had LOC. Denies chest pain or SOB. States got low blood pressure at home. Has pacemaker.

## 2015-10-02 NOTE — ED Provider Notes (Signed)
Time Seen: Approximately 1511  I have reviewed the triage notes  Chief Complaint: Dizziness   History of Present Illness: Christopher [name] is a 82 y.o. male who presents with a week or more of feeling lightheaded when he gets up and ambulates. He denies any vertiginous symptoms. He states he had one episode where he blacked out for a very short period of time he fell landing on his extended left arm and also hit his left hip. He states they're "" doing fine and "". He is more concerned because he still has persistent feelings of lightheadedness and chest pain or shortness of breath. He denies any fever or abdominal pain. Denies any melena or hematochezia. No focal weakness in either upper or lower extremities. No pulmonary emboli risk factors  Past Medical History  Diagnosis Date  . Hypertension   . HLD (hyperlipidemia)   . Gilbert disease   . Sensorineural hearing loss   . Osteopenia   . ED (erectile dysfunction)   . Macular degeneration   . BPH (benign prostatic hyperplasia)     Patient Active Problem List   Diagnosis Date Noted  . Syncope, cardiogenic 02/28/2015  . Symptomatic bradycardia 02/28/2015  . HTN (hypertension) 02/28/2015  . HLD (hyperlipidemia) 02/28/2015  . BPH (benign prostatic hyperplasia) 02/28/2015  . Alcohol use ([place]) 02/28/2015    Past Surgical History  Procedure Laterality Date  . Knee arthroscopy    . Cataract extraction    . Tonsillectomy    . Hemorrhoidectomy with hemorrhoid banding    . Pacemaker insertion N/A 03/03/2015    Procedure: INSERTION DUAL LEAD PACEMAKER;  Surgeon: Isaias Cowman, MD;  Location: [facility] ORS;  Service: Cardiovascular;  Laterality: N/A;    Past Surgical History  Procedure Laterality Date  . Knee arthroscopy    . Cataract extraction    . Tonsillectomy    . Hemorrhoidectomy with hemorrhoid banding    . Pacemaker insertion N/A 03/03/2015    Procedure: INSERTION DUAL LEAD PACEMAKER;  Surgeon: Isaias Cowman, MD;   Location: [facility] ORS;  Service: Cardiovascular;  Laterality: N/A;    Current Outpatient Rx  Name  Route  Sig  Dispense  Refill  . aspirin 325 MG tablet   Oral   Take 1 tablet by mouth daily.         Marland Kitchen atorvastatin (LIPITOR) 10 MG tablet   Oral   Take 1 tablet by mouth daily.         . calcium carbonate (TUMS - DOSED IN MG ELEMENTAL CALCIUM) 500 MG chewable tablet   Oral   Chew 1 tablet by mouth daily.         . cephALEXin (KEFLEX) 500 MG capsule   Oral   Take 1 capsule (500 mg total) by mouth every 12 (twelve) hours.   20 capsule   0   . Cholecalciferol (VITAMIN D3) 1000 UNITS CAPS   Oral   Take 1 capsule by mouth daily.         . finasteride (PROSCAR) 5 MG tablet   Oral   Take 1 tablet by mouth daily.         . hydrochlorothiazide (HYDRODIURIL) 25 MG tablet   Oral   Take 1 tablet (25 mg total) by mouth daily.   30 tablet   0   . lisinopril (PRINIVIL,ZESTRIL) 20 MG tablet   Oral   Take 0.5 tablets (10 mg total) by mouth daily.   30 tablet   0   . Multiple  Vitamin (MULTI-VITAMINS) TABS   Oral   Take 1 tablet by mouth daily.         . Multiple Vitamins-Minerals (OCUVITE PRESERVISION PO)   Oral   Take 1 tablet by mouth 2 (two) times daily.         . Omega-3 Fatty Acids (FISH OIL) 1000 MG CAPS   Oral   Take 1 tablet by mouth 2 (two) times daily.         . tamsulosin (FLOMAX) 0.4 MG CAPS capsule   Oral   Take 1 capsule by mouth daily.         Marland Kitchen ZOSTAVAX 57846 UNT/0.65ML injection   Intramuscular   Inject 1 vial into the muscle once.           Dispense as written.     Allergies:  Review of patient's allergies indicates no known allergies.  Family History: Family History  Problem Relation Age of Onset  . Diabetes Mother   . Lung cancer Father   . Cancer Paternal Uncle     Social History: Social History  Substance Use Topics  . Smoking status: Never Smoker   . Smokeless tobacco: None  . Alcohol Use: Yes     Review of  Systems:   10 point review of systems was performed and was otherwise negative:  Constitutional: No fever Eyes: No visual disturbances ENT: No sore throat, ear pain Cardiac: No chest pain Respiratory: No shortness of breath, wheezing, or stridor Abdomen: No abdominal pain, no vomiting, No diarrhea Endocrine: No weight loss, No night sweats Extremities: No peripheral edema, cyanosis Skin: No rashes, easy bruising Neurologic: No focal weakness, trouble with speech or swollowing Urologic: No dysuria, Hematuria, or urinary frequency   Physical Exam:  ED Triage Vitals  Enc Vitals Group     BP 10/02/15 1157 118/52 mmHg     Pulse Rate 10/02/15 1157 62     Resp 10/02/15 1157 18     Temp 10/02/15 1157 98.1 F (36.7 C)     Temp Source 10/02/15 1157 Oral     SpO2 10/02/15 1157 98 %     Weight 10/02/15 1157 190 lb (86.183 kg)     Height 10/02/15 1157 6' (1.829 m)     Head Cir --      Peak Flow --      Pain Score --      Pain Loc --      Pain Edu? --      Excl. in [place]? --     General: Awake , Alert , and Oriented times 3; GCS 15 Head: Normal cephalic , atraumatic Eyes: Pupils equal , round, reactive to light Nose/Throat: No nasal drainage, patent upper airway without erythema or exudate.  Neck: Supple, Full range of motion, No anterior adenopathy or palpable thyroid masses Lungs: Clear to ascultation without wheezes , rhonchi, or rales Heart: Regular rate, regular rhythm without murmurs , gallops , or rubs Abdomen: Soft, non tender without rebound, guarding , or rigidity; bowel sounds positive and symmetric in all 4 quadrants. No organomegaly .        Extremities: 2 plus symmetric pulses. No edema, clubbing or cyanosis Neurologic: normal ambulation, Motor symmetric without deficits, sensory intact Skin: warm, dry, no rashes   Labs:   All laboratory work was reviewed including any pertinent negatives or positives listed below:  [place]) - Abnormal; Notable for the following:    Color, Urine YELLOW (*)  APPearance CLEAR (*)    Squamous Epithelial / LPF 0-5 (*)    All other components within normal limits  COMPREHENSIVE METABOLIC PANEL - Abnormal; Notable for the following:    BUN 27 (*)    Total Bilirubin 1.4 (*)    All other components within normal limits  CBC  TROPONIN I   reviewed the patient's laboratory work shows a slightly elevated BUN to creatinine ratio.  EKG: ED ECG REPORT I, Daymon Larsen, the attending physician, personally viewed and interpreted this ECG.  Date: 10/02/2015 EKG Time: 1207 Rate: 61 Rhythm: Atrial paced heart rhythm. QRS Axis: normal Intervals: normal ST/T Wave abnormalities: normal Conduction Disturbances: none Narrative Interpretation: unremarkable No acute ischemic changes    ED Course:  Patient's stay here was uneventful and he arrives hemodynamically stable. I felt review of his symptoms and his laboratory work E was probably near syncopal from some mild dehydration. Patient was given a 500 mL fluid bolus and felt symptomatically improved. He is advised he can decrease his hydrochlorothiazide at 12.5 mg per day. We also discussed the possibility of Flomax causing some orthostatic hypotension. Patient appears to be of understanding was advised follow-up with his cardiologist due to his previous pacemaker and hypertension.   Assessment: * Near syncope Dehydration     Plan:  Outpatient management Patient was advised to return immediately if condition worsens. Patient was advised to follow up with their primary care physician or other specialized physicians involved in their outpatient care. The patient and/or family member/power of attorney had laboratory results reviewed at the bedside. All questions and concerns were addressed and appropriate discharge instructions were distributed by the nursing staff.            Daymon Larsen,  MD 10/02/15 [phone]

## 2015-10-02 NOTE — ED Notes (Signed)
Pt and family expressed desire to leave.  Explained to pt reason for delay.  Pt and family stated that they would wait 30 min.  MD informed

## 2015-10-02 NOTE — Discharge Instructions (Signed)

## 2015-10-02 NOTE — ED Notes (Signed)
Speech clear and oriented. Grips and leg strength equal. Smile symmetrical.

## 2016-07-07 ENCOUNTER — Ambulatory Visit (INDEPENDENT_AMBULATORY_CARE_PROVIDER_SITE_OTHER): Payer: Medicare Other | Admitting: Urology

## 2016-07-07 ENCOUNTER — Encounter: Payer: Self-pay | Admitting: Urology

## 2016-07-07 VITALS — BP 114/58 | HR 80 | Ht 72.0 in | Wt 201.4 lb

## 2016-07-07 DIAGNOSIS — N138 Other obstructive and reflux uropathy: Secondary | ICD-10-CM

## 2016-07-07 DIAGNOSIS — N401 Enlarged prostate with lower urinary tract symptoms: Secondary | ICD-10-CM | POA: Diagnosis not present

## 2016-07-07 LAB — URINALYSIS, COMPLETE
Bilirubin, UA: NEGATIVE
GLUCOSE, UA: NEGATIVE
Ketones, UA: NEGATIVE
Leukocytes, UA: NEGATIVE
NITRITE UA: NEGATIVE
RBC, UA: NEGATIVE
Specific Gravity, UA: 1.025 (ref 1.005–1.030)
Urobilinogen, Ur: 0.2 mg/dL (ref 0.2–1.0)
pH, UA: 5 (ref 5.0–7.5)

## 2016-07-07 LAB — BLADDER SCAN AMB NON-IMAGING: Scan Result: 250

## 2016-07-07 LAB — MICROSCOPIC EXAMINATION: Bacteria, UA: NONE SEEN

## 2016-07-07 MED ORDER — TAMSULOSIN HCL 0.4 MG PO CAPS: 0.4000 mg | ORAL_CAPSULE | Freq: Every day | ORAL | 3 refills | Status: DC

## 2016-07-07 NOTE — Progress Notes (Signed)
07/07/2016 3:21 PM   Christopher Christopher [name] May 09, 1928 MG:[id number]  Referring provider: Ezequiel Kayser, MD [facility] [place], [place] [address]  Chief Complaint  Patient presents with  . New Patient (Initial Visit)    HPI: The patient is a 82 year old gentleman presents today to discuss his urinary complaints. He is currently on finasteride for BPH. His biggest complaint at this time is urinary frequency during the day as well as a decreased stream. He also has hesitancy. He feels his stream is very weak which is also bothersome. He has nocturia 1 which is not bothersome. He has no history of urinary tract infections or gross hematuria. He has been on finasteride for a number of years. He was on Flomax before but it was stopped for some reason. He does not believe it was for hypotension. The patient states his blood pressure normally runs high.    PVR 250 cc.     PMH: Past Medical History:  Diagnosis Date  . BPH (benign prostatic hyperplasia)   . ED (erectile dysfunction)   . Gilbert disease   . HLD (hyperlipidemia)   . Hypertension   . Macular degeneration   . Osteopenia   . Sensorineural hearing loss     Surgical History: Past Surgical History:  Procedure Laterality Date  . CATARACT EXTRACTION    . HEMORRHOIDECTOMY WITH HEMORRHOID BANDING    . KNEE ARTHROSCOPY    . PACEMAKER INSERTION N/A 03/03/2015   Procedure: INSERTION DUAL LEAD PACEMAKER;  Surgeon: Christopher Cowman, MD;  Location: [facility] ORS;  Service: Cardiovascular;  Laterality: N/A;  . TONSILLECTOMY      Home Medications:  Allergies as of 07/07/2016   No Known Allergies     Medication List       Accurate as of 07/07/16  3:21 PM. Always use your most recent med list.          aspirin 325 MG tablet Take 1 tablet by mouth daily.   atorvastatin 10 MG tablet Commonly known as:  LIPITOR Take 1 tablet by mouth daily.   calcium carbonate 500 MG chewable tablet Commonly known  as:  TUMS - dosed in mg elemental calcium Chew 1 tablet by mouth daily.   cephALEXin 500 MG capsule Commonly known as:  KEFLEX Take 1 capsule (500 mg total) by mouth every 12 (twelve) hours.   finasteride 5 MG tablet Commonly known as:  PROSCAR Take 1 tablet by mouth daily.   Fish Oil 1000 MG Caps Take 1 tablet by mouth 2 (two) times daily.   hydrochlorothiazide 25 MG tablet Commonly known as:  HYDRODIURIL Take 1 tablet (25 mg total) by mouth daily.   lisinopril 20 MG tablet Commonly known as:  PRINIVIL,ZESTRIL Take 0.5 tablets (10 mg total) by mouth daily.   MULTI-VITAMINS Tabs Take 1 tablet by mouth daily.   OCUVITE PRESERVISION PO Take 1 tablet by mouth 2 (two) times daily.   tamsulosin 0.4 MG Caps capsule Commonly known as:  FLOMAX Take 1 capsule by mouth daily.   Vitamin D3 1000 units Caps Take 1 capsule by mouth daily.   ZOSTAVAX 13086 UNT/0.65ML injection Generic drug:  zoster vaccine live (PF) Inject 1 vial into the muscle once.       Allergies: No Known Allergies  Family History: Family History  Problem Relation Age of Onset  . Diabetes Mother   . Lung cancer Father   . Cancer Paternal Uncle   . Prostate cancer Neg Hx   .  Bladder Cancer Neg Hx   . Kidney cancer Neg Hx     Social History:  reports that he has never smoked. He has never used smokeless tobacco. He reports that he drinks alcohol. He reports that he does not use drugs.  ROS: UROLOGY Frequent Urination?: Yes Hard to postpone urination?: Yes Burning/pain with urination?: No Get up at night to urinate?: Yes Leakage of urine?: Yes Urine stream starts and stops?: No Trouble starting stream?: No Do you have to strain to urinate?: No Blood in urine?: No Urinary tract infection?: No Sexually transmitted disease?: No Injury to kidneys or bladder?: No Painful intercourse?: No Weak stream?: Yes Erection problems?: Yes Penile pain?: No  Gastrointestinal Nausea?: No Vomiting?:  No Indigestion/heartburn?: No Diarrhea?: No Constipation?: No  Constitutional Fever: No Night sweats?: No Weight loss?: No Fatigue?: No  Skin Skin rash/lesions?: No Itching?: No  Eyes Blurred vision?: No Double vision?: No  Ears/Nose/Throat Sore throat?: No Sinus problems?: No  Hematologic/Lymphatic Swollen glands?: No Easy bruising?: No  Cardiovascular Leg swelling?: Yes Chest pain?: No  Respiratory Cough?: No Shortness of breath?: No  Endocrine Excessive thirst?: No  Musculoskeletal Back pain?: Yes Joint pain?: No  Neurological Headaches?: No Dizziness?: No  Psychologic Depression?: No Anxiety?: No  Physical Exam: BP (!) 114/58 (BP Location: Left Arm, Patient Position: Sitting, Cuff Size: Normal)   Pulse 80   Ht 6' (1.829 m)   Wt 201 lb 6.4 oz (91.4 kg)   BMI 27.31 kg/m   Constitutional:  Christopher [name] and oriented, No acute distress. HEENT: [place] AT, moist mucus membranes.  Trachea midline, no masses. Cardiovascular: No clubbing, cyanosis, or edema. Respiratory: Normal respiratory effort, no increased work of breathing. GI: Abdomen is soft, nontender, nondistended, no abdominal masses GU: No CVA tenderness. Phallus. Testicles descended equally bilaterally benign. DRE: 2+ smooth benign. Skin: No rashes, bruises or suspicious lesions. Lymph: No cervical or inguinal adenopathy. Neurologic: Grossly intact, no focal deficits, moving all 4 extremities. Psychiatric: Normal mood and affect.  Laboratory Data: Lab Results  Component Value Date   WBC 5.6 10/02/2015   HGB 14.6 10/02/2015   HCT 42.6 10/02/2015   MCV 93.1 10/02/2015   PLT 190 10/02/2015    Lab Results  Component Value Date   CREATININE 0.96 10/02/2015    No results found for: PSA  No results found for: TESTOSTERONE  No results found for: HGBA1C  Urinalysis    Component Value Date/Time   COLORURINE YELLOW (A) 10/02/2015 1210   APPEARANCEUR CLEAR (A) 10/02/2015 1210   LABSPEC  1.017 10/02/2015 1210   PHURINE 5.0 10/02/2015 1210   GLUCOSEU NEGATIVE 10/02/2015 1210   HGBUR NEGATIVE 10/02/2015 1210   BILIRUBINUR NEGATIVE 10/02/2015 1210   KETONESUR NEGATIVE 10/02/2015 1210   PROTEINUR NEGATIVE 10/02/2015 1210   NITRITE NEGATIVE 10/02/2015 1210   LEUKOCYTESUR NEGATIVE 10/02/2015 1210      Assessment & Plan:    1. BPH -continue finasteride -add flomax 0.4 mg daily -follow up in 3 months  Return in about 3 months (around 10/05/2016).  Nickie Retort, MD  [facility] [address] [place], [place] [address] [phone]

## 2016-10-05 ENCOUNTER — Ambulatory Visit (INDEPENDENT_AMBULATORY_CARE_PROVIDER_SITE_OTHER): Payer: Medicare Other | Admitting: Urology

## 2016-10-05 ENCOUNTER — Encounter: Payer: Self-pay | Admitting: Urology

## 2016-10-05 VITALS — BP 103/68 | HR 137 | Ht 72.0 in | Wt 190.6 lb

## 2016-10-05 DIAGNOSIS — N401 Enlarged prostate with lower urinary tract symptoms: Secondary | ICD-10-CM | POA: Diagnosis not present

## 2016-10-05 LAB — BLADDER SCAN AMB NON-IMAGING: Scan Result: 214

## 2016-10-05 NOTE — Progress Notes (Signed)
10/05/2016 12:09 PM   Christopher [name] Mar 11, 1928 [id number]  Referring provider: Ezequiel Kayser, MD [facility] [place], [place]  [address]  Chief Complaint  Patient presents with  . Benign Prostatic Hypertrophy    HPI: The patient is a 83 year old gentleman presents today to discuss his urinary complaints. He is currently on finasteride for BPH. His biggest complaint at this time is urinary frequency during the day as well as a decreased stream. He also has hesitancy. He feels his stream is very weak which is also bothersome. He has nocturia 1 which is not bothersome. He has no history of urinary tract infections or gross hematuria. He has been on finasteride for a number of years. He was on Flomax before but it was stopped for some reason. He does not believe it was for hypotension. The patient states his blood pressure normally runs high.  DRE: 2+ benign  At his last visit, flomax was added to his regimen. He returns today for follow up. He has noted significant improvement in his symptoms. He still has a feeling of incomplete emptying and weak stream but otherwise only endorses mild urgency and nocturia 1. Is mostly satisfied with his improvement.  However, since he was last seen he was diagnosed A. fib was started on liquids. He does feel that he has been dizzy over the last few months. He is dizzy when he stands up as well as at rest.  PVR: 214 IPSS: 12/2  PMH: Past Medical History:  Diagnosis Date  . BPH (benign prostatic hyperplasia)   . ED (erectile dysfunction)   . Gilbert disease   . HLD (hyperlipidemia)   . Hypertension   . Macular degeneration   . Osteopenia   . Sensorineural hearing loss     Surgical History: Past Surgical History:  Procedure Laterality Date  . CATARACT EXTRACTION    . HEMORRHOIDECTOMY WITH HEMORRHOID BANDING    . KNEE ARTHROSCOPY    . PACEMAKER INSERTION N/A 03/03/2015   Procedure: INSERTION DUAL LEAD PACEMAKER;   Surgeon: Isaias Cowman, MD;  Location: [facility] ORS;  Service: Cardiovascular;  Laterality: N/A;  . TONSILLECTOMY      Home Medications:  Allergies as of 10/05/2016   No Known Allergies     Medication List       Accurate as of 10/05/16 12:09 PM. Always use your most recent med list.          aspirin 325 MG tablet Take 1 tablet by mouth daily.   atorvastatin 10 MG tablet Commonly known as:  LIPITOR Take 1 tablet by mouth daily.   calcium carbonate 500 MG chewable tablet Commonly known as:  TUMS - dosed in mg elemental calcium Chew 1 tablet by mouth daily.   cephALEXin 500 MG capsule Commonly known as:  KEFLEX Take 1 capsule (500 mg total) by mouth every 12 (twelve) hours.   finasteride 5 MG tablet Commonly known as:  PROSCAR Take 1 tablet by mouth daily.   Fish Oil 1000 MG Caps Take 1 tablet by mouth 2 (two) times daily.   hydrochlorothiazide 25 MG tablet Commonly known as:  HYDRODIURIL Take 1 tablet (25 mg total) by mouth daily.   lisinopril 20 MG tablet Commonly known as:  PRINIVIL,ZESTRIL Take 0.5 tablets (10 mg total) by mouth daily.   MULTI-VITAMINS Tabs Take 1 tablet by mouth daily.   OCUVITE PRESERVISION PO Take 1 tablet by mouth 2 (two) times daily.   tamsulosin 0.4 MG Caps capsule Commonly  known as:  FLOMAX Take 1 capsule by mouth daily.   Vitamin D3 1000 units Caps Take 1 capsule by mouth daily.   ZOSTAVAX 16109 UNT/0.65ML injection Generic drug:  zoster vaccine live (PF) Inject 1 vial into the muscle once.       Allergies: No Known Allergies  Family History: Family History  Problem Relation Age of Onset  . Diabetes Mother   . Lung cancer Father   . Cancer Paternal Uncle   . Prostate cancer Neg Hx   . Bladder Cancer Neg Hx   . Kidney cancer Neg Hx     Social History:  reports that he has never smoked. He has never used smokeless tobacco. He reports that he drinks alcohol. He reports that he does not use  drugs.  ROS: UROLOGY Frequent Urination?: No Hard to postpone urination?: No Burning/pain with urination?: No Get up at night to urinate?: Yes Leakage of urine?: No Urine stream starts and stops?: No Trouble starting stream?: No Do you have to strain to urinate?: Yes Blood in urine?: No Urinary tract infection?: No Sexually transmitted disease?: No Injury to kidneys or bladder?: No Painful intercourse?: No Weak stream?: Yes Erection problems?: Yes Penile pain?: No  Gastrointestinal Nausea?: No Vomiting?: No Indigestion/heartburn?: Yes Diarrhea?: No Constipation?: No  Constitutional Fever: No Night sweats?: No Weight loss?: No Fatigue?: No  Skin Skin rash/lesions?: No Itching?: No  Eyes Blurred vision?: No Double vision?: No  Ears/Nose/Throat Sore throat?: No Sinus problems?: No  Hematologic/Lymphatic Swollen glands?: No Easy bruising?: No  Cardiovascular Leg swelling?: Yes Chest pain?: No  Respiratory Cough?: No Shortness of breath?: No  Endocrine Excessive thirst?: No  Musculoskeletal Back pain?: Yes Joint pain?: No  Neurological Headaches?: No Dizziness?: Yes  Psychologic Depression?: No Anxiety?: No  Physical Exam: BP 103/68   Pulse (!) 137   Ht 6' (1.829 m)   Wt 190 lb 9.6 oz (86.5 kg)   BMI 25.85 kg/m   Constitutional:  Alert and oriented, No acute distress. HEENT:  AT, moist mucus membranes.  Trachea midline, no masses. Cardiovascular: No clubbing, cyanosis, or edema. Respiratory: Normal respiratory effort, no increased work of breathing. GI: Abdomen is soft, nontender, nondistended, no abdominal masses GU: No CVA tenderness.  Skin: No rashes, bruises or suspicious lesions. Lymph: No cervical or inguinal adenopathy. Neurologic: Grossly intact, no focal deficits, moving all 4 extremities. Psychiatric: Normal mood and affect.  Laboratory Data: Lab Results  Component Value Date   WBC 5.6 10/02/2015   HGB 14.6  10/02/2015   HCT 42.6 10/02/2015   MCV 93.1 10/02/2015   PLT 190 10/02/2015    Lab Results  Component Value Date   CREATININE 0.96 10/02/2015    No results found for: PSA  No results found for: TESTOSTERONE  No results found for: HGBA1C  Urinalysis    Component Value Date/Time   COLORURINE YELLOW (A) 10/02/2015 1210   APPEARANCEUR Clear 07/07/2016 1442   LABSPEC 1.017 10/02/2015 1210   PHURINE 5.0 10/02/2015 1210   GLUCOSEU Negative 07/07/2016 1442   HGBUR NEGATIVE 10/02/2015 1210   BILIRUBINUR Negative 07/07/2016 1442   KETONESUR NEGATIVE 10/02/2015 1210   PROTEINUR Trace (A) 07/07/2016 1442   PROTEINUR NEGATIVE 10/02/2015 1210   NITRITE Negative 07/07/2016 1442   NITRITE NEGATIVE 10/02/2015 1210   LEUKOCYTESUR Negative 07/07/2016 1442    Assessment & Plan:    1. BPH -continue finasteride -Though Flomax seems to be working well for him, I'm concerned that his dizziness may be related  to this medication. I have asked him to stop his Flomax for 1 week. If he continues to have this mild dizziness off of Flomax, then I suspect Flomax is not the source and he will restart the medication. If his symptoms resolve by stopping Flomax, we can try another alpha antagonist to see if it has less side effects. He will touch base with the office in approximate 1 week with results of his 1 week drug holiday from Flomax. He'll follow up in a few months to assess his progress.  Return in about 3 months (around 01/05/2017).  Nickie Retort, MD  [facility] [address], [place], [place] [address] [phone]

## 2016-10-11 DIAGNOSIS — I4819 Other persistent atrial fibrillation: Secondary | ICD-10-CM | POA: Insufficient documentation

## 2016-10-15 ENCOUNTER — Telehealth: Payer: Self-pay | Admitting: Urology

## 2016-10-15 DIAGNOSIS — N401 Enlarged prostate with lower urinary tract symptoms: Secondary | ICD-10-CM

## 2016-10-15 NOTE — Telephone Encounter (Signed)
Patient called the office and left a voice mail that Dr. Pilar Jarvis needs to change one of his medications.  Please contact the patient.

## 2016-10-16 NOTE — Telephone Encounter (Signed)
Pt uses Camera operator.  He is no longer having dizzy spells since stopping the Tamsulosin.  He needs a replacement for this because because he is having difficulty urinating.  I informed pt Budzyn wouldn't be back in office until 4/18 and he said he will need something before then.  Please give pt a call.

## 2016-10-25 MED ORDER — SILODOSIN 8 MG PO CAPS: 8.0000 mg | ORAL_CAPSULE | Freq: Every day | ORAL | 11 refills | Status: DC

## 2016-10-25 NOTE — Telephone Encounter (Signed)
Spoke with pt in reference to rapaflo and side effects. Pt voiced understanding.

## 2016-10-27 ENCOUNTER — Telehealth: Payer: Self-pay | Admitting: Urology

## 2016-10-27 NOTE — Telephone Encounter (Signed)
Pharmacy called left voicemail stating they need verification on a script.  Please call to verify @ [phone] ref # [id number] also wants to do 90 supply at a time.

## 2016-10-27 NOTE — Telephone Encounter (Signed)
[facility] and answered all questions in full.

## 2016-10-31 ENCOUNTER — Telehealth: Payer: Self-pay

## 2016-10-31 NOTE — Telephone Encounter (Signed)
PA for rapaflo APPROVED!

## 2016-11-01 ENCOUNTER — Telehealth: Payer: Self-pay

## 2016-11-01 NOTE — Telephone Encounter (Signed)
PA for rapaflo APPROVED!

## 2016-11-08 ENCOUNTER — Telehealth: Payer: Self-pay

## 2016-11-08 DIAGNOSIS — N401 Enlarged prostate with lower urinary tract symptoms: Secondary | ICD-10-CM

## 2016-11-08 MED ORDER — ALFUZOSIN HCL ER 10 MG PO TB24: 10.0000 mg | ORAL_TABLET | Freq: Every day | ORAL | 11 refills | Status: DC

## 2016-11-08 NOTE — Telephone Encounter (Signed)
Pt sent a letter to Dr. Pilar Jarvis stating he was not able to afford rapaflo even after PA being approved. Per. Dr. Pilar Jarvis alfuzosin was sent into pharmacy.

## 2016-11-30 ENCOUNTER — Telehealth: Payer: Self-pay | Admitting: Urology

## 2016-11-30 DIAGNOSIS — N401 Enlarged prostate with lower urinary tract symptoms: Secondary | ICD-10-CM

## 2016-11-30 MED ORDER — ALFUZOSIN HCL ER 10 MG PO TB24: 10.0000 mg | ORAL_TABLET | Freq: Every day | ORAL | 3 refills | Status: DC

## 2016-11-30 NOTE — Telephone Encounter (Signed)
Pt called office stating that he needs a refill on his Alfuzosin and would like to change it 90 days at [facility]. Please call and advise pt. Thanks.

## 2016-11-30 NOTE — Telephone Encounter (Signed)
Script sent to pharmacy.

## 2017-01-04 ENCOUNTER — Encounter: Payer: Self-pay | Admitting: Urology

## 2017-01-04 ENCOUNTER — Ambulatory Visit (INDEPENDENT_AMBULATORY_CARE_PROVIDER_SITE_OTHER): Payer: Medicare Other | Admitting: Urology

## 2017-01-04 VITALS — BP 121/61 | HR 80 | Ht 72.0 in | Wt 198.0 lb

## 2017-01-04 DIAGNOSIS — N401 Enlarged prostate with lower urinary tract symptoms: Secondary | ICD-10-CM

## 2017-01-04 LAB — BLADDER SCAN AMB NON-IMAGING: Scan Result: 172

## 2017-01-04 NOTE — Progress Notes (Signed)
01/04/2017 11:31 AM   Christopher [name] 10/13/1927 [id number]  Referring provider: Ezequiel Kayser, MD [facility] [place], [place] [address]  Chief Complaint  Patient presents with  . Benign Prostatic Hypertrophy    HPI: The patient is a 83 year old gentleman presents today to discuss his urinary complaints. He is currently on finasteride for BPH. His biggest complaint at this time is urinary frequency during the day as well as a decreased stream. He also has hesitancy. He feels his stream is very weak which is also bothersome. He has nocturia 1 which is not bothersome. He has no history of urinary tract infections or gross hematuria. He has been on finasteride for a number of years. He was on Flomax before but it was stopped for some reason. He does not believe it was for hypotension. The patient states his blood pressure normally runs high.  DRE: 2+ benign  However, since he was last seen he was diagnosed A. fib was started on eliquis. He does feel that he has been dizzy over the last few months. He is dizzy when he stands up as well as at rest. He was switched to uroxatral from flomax and his dizziness improved.   He is still doing very well at this time. He is voiding well this time. His nocturia 1. A good stream. He feels he empties his bladder. He has no hesitancy. He has minor urge with urge incontinence does not bother him. He is overall very pleased with his results.  PVR: 176  PMH: Past Medical History:  Diagnosis Date  . BPH (benign prostatic hyperplasia)   . ED (erectile dysfunction)   . Gilbert disease   . HLD (hyperlipidemia)   . Hypertension   . Macular degeneration   . Osteopenia   . Sensorineural hearing loss     Surgical History: Past Surgical History:  Procedure Laterality Date  . CATARACT EXTRACTION    . HEMORRHOIDECTOMY WITH HEMORRHOID BANDING    . KNEE ARTHROSCOPY    . PACEMAKER INSERTION N/A 03/03/2015   Procedure:  INSERTION DUAL LEAD PACEMAKER;  Surgeon: Isaias Cowman, MD;  Location: [facility] ORS;  Service: Cardiovascular;  Laterality: N/A;  . TONSILLECTOMY      Home Medications:  Allergies as of 01/04/2017   No Known Allergies     Medication List       Accurate as of 01/04/17 11:31 AM. Always use your most recent med list.          alfuzosin 10 MG 24 hr tablet Commonly known as:  UROXATRAL Take 1 tablet (10 mg total) by mouth daily with breakfast.   aspirin 325 MG tablet Take 1 tablet by mouth daily.   atorvastatin 10 MG tablet Commonly known as:  LIPITOR Take 1 tablet by mouth daily.   ELIQUIS 5 MG Tabs tablet Generic drug:  apixaban Take 5 mg by mouth 2 (two) times daily.   finasteride 5 MG tablet Commonly known as:  PROSCAR Take 1 tablet by mouth daily.   Fish Oil 1000 MG Caps Take 1 tablet by mouth 2 (two) times daily.   hydrochlorothiazide 25 MG tablet Commonly known as:  HYDRODIURIL Take 1 tablet (25 mg total) by mouth daily.   lisinopril 20 MG tablet Commonly known as:  PRINIVIL,ZESTRIL Take 0.5 tablets (10 mg total) by mouth daily.   MULTI-VITAMINS Tabs Take 1 tablet by mouth daily.   OCUVITE PRESERVISION PO Take 1 tablet by mouth 2 (two) times daily.  Vitamin D3 1000 units Caps Take 1 capsule by mouth daily.   ZOSTAVAX 40102 UNT/0.65ML injection Generic drug:  zoster vaccine live (PF) Inject 1 vial into the muscle once.       Allergies: No Known Allergies  Family History: Family History  Problem Relation Age of Onset  . Diabetes Mother   . Lung cancer Father   . Cancer Paternal Uncle   . Prostate cancer Neg Hx   . Bladder Cancer Neg Hx   . Kidney cancer Neg Hx     Social History:  reports that he has never smoked. He has never used smokeless tobacco. He reports that he drinks alcohol. He reports that he does not use drugs.  ROS: UROLOGY Frequent Urination?: No Hard to postpone urination?: Yes Burning/pain with urination?: No Get up  at night to urinate?: No Leakage of urine?: No Urine stream starts and stops?: No Trouble starting stream?: No Do you have to strain to urinate?: No Blood in urine?: No Urinary tract infection?: No Sexually transmitted disease?: No Injury to kidneys or bladder?: No Painful intercourse?: No Weak stream?: No Erection problems?: Yes Penile pain?: No  Gastrointestinal Nausea?: No Vomiting?: No Indigestion/heartburn?: No Diarrhea?: No Constipation?: No  Constitutional Fever: No Night sweats?: No Weight loss?: No Fatigue?: No  Skin Skin rash/lesions?: No Itching?: No  Eyes Blurred vision?: No Double vision?: No  Ears/Nose/Throat Sore throat?: No Sinus problems?: No  Hematologic/Lymphatic Swollen glands?: No Easy bruising?: No  Cardiovascular Leg swelling?: No Chest pain?: No  Respiratory Cough?: No Shortness of breath?: No  Endocrine Excessive thirst?: No  Musculoskeletal Back pain?: Yes Joint pain?: No  Neurological Headaches?: No Dizziness?: No  Psychologic Depression?: No Anxiety?: No  Physical Exam: BP 121/61 (BP Location: Left Arm, Patient Position: Sitting, Cuff Size: Normal)   Pulse 80   Ht 6' (1.829 m)   Wt 198 lb (89.8 kg)   BMI 26.85 kg/m   Constitutional:  [name] and oriented, No acute distress. HEENT: [place] AT, moist mucus membranes.  Trachea midline, no masses. Cardiovascular: No clubbing, cyanosis, or edema. Respiratory: Normal respiratory effort, no increased work of breathing. GI: Abdomen is soft, nontender, nondistended, no abdominal masses GU: No CVA tenderness.  Skin: No rashes, bruises or suspicious lesions. Lymph: No cervical or inguinal adenopathy. Neurologic: Grossly intact, no focal deficits, moving all 4 extremities. Psychiatric: Normal mood and affect.  Laboratory Data: Lab Results  Component Value Date   WBC 5.6 10/02/2015   HGB 14.6 10/02/2015   HCT 42.6 10/02/2015   MCV 93.1 10/02/2015   PLT 190 10/02/2015      Lab Results  Component Value Date   CREATININE 0.96 10/02/2015    No results found for: PSA  No results found for: TESTOSTERONE  No results found for: HGBA1C  Urinalysis    Component Value Date/Time   COLORURINE YELLOW (A) 10/02/2015 1210   APPEARANCEUR Clear 07/07/2016 1442   LABSPEC 1.017 10/02/2015 1210   PHURINE 5.0 10/02/2015 1210   GLUCOSEU Negative 07/07/2016 1442   HGBUR NEGATIVE 10/02/2015 1210   BILIRUBINUR Negative 07/07/2016 1442   KETONESUR NEGATIVE 10/02/2015 1210   PROTEINUR Trace (A) 07/07/2016 1442   PROTEINUR NEGATIVE 10/02/2015 1210   NITRITE Negative 07/07/2016 1442   NITRITE NEGATIVE 10/02/2015 1210   LEUKOCYTESUR Negative 07/07/2016 1442    Assessment & Plan:    1. BPH Symptoms are well controlled with Uroxatral and finasteride. We'll continue these medications. The patient will follow up annually or sooner if he has  new symptoms arise.  Return in about 1 year (around 01/04/2018).  Nickie Retort, MD  [facility] [address] [place], [place] [address] [phone]

## 2017-03-16 ENCOUNTER — Other Ambulatory Visit: Payer: Self-pay

## 2017-03-16 ENCOUNTER — Telehealth: Payer: Self-pay | Admitting: Urology

## 2017-03-16 DIAGNOSIS — N401 Enlarged prostate with lower urinary tract symptoms: Secondary | ICD-10-CM

## 2017-03-16 MED ORDER — ALFUZOSIN HCL ER 10 MG PO TB24: 10.0000 mg | ORAL_TABLET | Freq: Every day | ORAL | 3 refills | Status: DC

## 2017-03-16 NOTE — Progress Notes (Signed)
Refills sent in

## 2017-03-16 NOTE — Telephone Encounter (Signed)
Pt 's prescription was lost in the mail and he would like a 30 day prescription sent in to [facility] on [address]  Alfuzosin 10 mg    Please give pt a call. [phone]

## 2017-03-16 NOTE — Telephone Encounter (Signed)
Refill sent in

## 2018-01-02 ENCOUNTER — Other Ambulatory Visit: Payer: Self-pay | Admitting: Family Medicine

## 2018-01-02 DIAGNOSIS — N401 Enlarged prostate with lower urinary tract symptoms: Secondary | ICD-10-CM

## 2018-01-02 MED ORDER — ALFUZOSIN HCL ER 10 MG PO TB24: 10.0000 mg | ORAL_TABLET | Freq: Every day | ORAL | 0 refills | Status: DC

## 2018-01-03 ENCOUNTER — Ambulatory Visit: Payer: Medicare Other

## 2018-01-21 ENCOUNTER — Encounter: Payer: Self-pay | Admitting: Urology

## 2018-01-21 ENCOUNTER — Ambulatory Visit (INDEPENDENT_AMBULATORY_CARE_PROVIDER_SITE_OTHER): Payer: Medicare Other | Admitting: Urology

## 2018-01-21 VITALS — BP 100/65 | HR 112 | Resp 16 | Ht 71.0 in | Wt 200.0 lb

## 2018-01-21 DIAGNOSIS — R3914 Feeling of incomplete bladder emptying: Secondary | ICD-10-CM | POA: Diagnosis not present

## 2018-01-21 DIAGNOSIS — N401 Enlarged prostate with lower urinary tract symptoms: Secondary | ICD-10-CM | POA: Diagnosis not present

## 2018-01-21 LAB — URINALYSIS, COMPLETE
Bilirubin, UA: NEGATIVE
GLUCOSE, UA: NEGATIVE
Ketones, UA: NEGATIVE
Leukocytes, UA: NEGATIVE
Nitrite, UA: NEGATIVE
PROTEIN UA: NEGATIVE
RBC, UA: NEGATIVE
Specific Gravity, UA: 1.015 (ref 1.005–1.030)
UUROB: 0.2 mg/dL (ref 0.2–1.0)
pH, UA: 6 (ref 5.0–7.5)

## 2018-01-21 LAB — BLADDER SCAN AMB NON-IMAGING: SCAN RESULT: 199

## 2018-01-21 LAB — MICROSCOPIC EXAMINATION

## 2018-01-22 ENCOUNTER — Encounter: Payer: Self-pay | Admitting: Urology

## 2018-01-22 MED ORDER — ALFUZOSIN HCL ER 10 MG PO TB24: 10.0000 mg | ORAL_TABLET | Freq: Every day | ORAL | 3 refills | Status: DC

## 2018-01-22 NOTE — Progress Notes (Signed)
01/21/2018 12:50 PM   Christopher [name] 03/05/1928 [id number]  Referring provider: Ezequiel Kayser, MD [place] [facility] [place], [place] [address]  Chief Complaint  Patient presents with  . Benign Prostatic Hypertrophy    HPI: 84 year old male presents for annual follow-up BPH with incomplete bladder emptying.  His PVR ranges around 200 mL.  He remains on finasteride and alfuzosin.  He denies bothersome lower urinary tract symptoms.  He notes occasional decrease in his urinary stream.  Denies dysuria or gross hematuria.  He denies flank, abdominal, pelvic or scrotal pain.    PMH: Past Medical History:  Diagnosis Date  . BPH (benign prostatic hyperplasia)   . ED (erectile dysfunction)   . Gilbert disease   . HLD (hyperlipidemia)   . Hypertension   . Macular degeneration   . Osteopenia   . Sensorineural hearing loss     Surgical History: Past Surgical History:  Procedure Laterality Date  . CATARACT EXTRACTION    . HEMORRHOIDECTOMY WITH HEMORRHOID BANDING    . KNEE ARTHROSCOPY    . PACEMAKER INSERTION N/A 03/03/2015   Procedure: INSERTION DUAL LEAD PACEMAKER;  Surgeon: Isaias Cowman, MD;  Location: [facility] ORS;  Service: Cardiovascular;  Laterality: N/A;  . TONSILLECTOMY      Home Medications:  Allergies as of 01/21/2018   No Known Allergies     Medication List        Accurate as of 01/21/18 11:59 PM. Always use your most recent med list.          alfuzosin 10 MG 24 hr tablet Commonly known as:  UROXATRAL Take 1 tablet (10 mg total) by mouth daily with breakfast.   aspirin 325 MG tablet Take 1 tablet by mouth daily.   atorvastatin 10 MG tablet Commonly known as:  LIPITOR Take 1 tablet by mouth daily.   ELIQUIS 5 MG Tabs tablet Generic drug:  apixaban Take 5 mg by mouth 2 (two) times daily.   finasteride 5 MG tablet Commonly known as:  PROSCAR Take 1 tablet by mouth daily.   Fish Oil 1000 MG Caps Take 1 tablet by mouth 2  (two) times daily.   hydrochlorothiazide 25 MG tablet Commonly known as:  HYDRODIURIL Take 1 tablet (25 mg total) by mouth daily.   lisinopril 20 MG tablet Commonly known as:  PRINIVIL,ZESTRIL Take 0.5 tablets (10 mg total) by mouth daily.   MULTI-VITAMINS Tabs Take 1 tablet by mouth daily.   OCUVITE PRESERVISION PO Take 1 tablet by mouth 2 (two) times daily.   Vitamin D3 1000 units Caps Take 1 capsule by mouth daily.   ZOSTAVAX 02725 UNT/0.65ML injection Generic drug:  zoster vaccine live (PF) Inject 1 vial into the muscle once.       Allergies: No Known Allergies  Family History: Family History  Problem Relation Age of Onset  . Diabetes Mother   . Lung cancer Father   . Cancer Paternal Uncle   . Prostate cancer Neg Hx   . Bladder Cancer Neg Hx   . Kidney cancer Neg Hx     Social History:  reports that he has never smoked. He has never used smokeless tobacco. He reports that he drinks alcohol. He reports that he does not use drugs.  ROS: UROLOGY Frequent Urination?: Yes Hard to postpone urination?: No Burning/pain with urination?: No Get up at night to urinate?: Yes Leakage of urine?: No Urine stream starts and stops?: No Trouble starting stream?: No Do you have to  strain to urinate?: No Blood in urine?: No Urinary tract infection?: No Sexually transmitted disease?: No Injury to kidneys or bladder?: No Painful intercourse?: No Weak stream?: Yes Erection problems?: No Penile pain?: No  Gastrointestinal Nausea?: No Vomiting?: No Indigestion/heartburn?: No Diarrhea?: No Constipation?: No  Constitutional Fever: No Night sweats?: No Weight loss?: No Fatigue?: No  Skin Skin rash/lesions?: No Itching?: No  Eyes Blurred vision?: No Double vision?: No  Ears/Nose/Throat Sore throat?: No Sinus problems?: No  Hematologic/Lymphatic Swollen glands?: No Easy bruising?: Yes  Cardiovascular Leg swelling?: Yes Chest pain?:  No  Respiratory Cough?: No Shortness of breath?: No  Endocrine Excessive thirst?: No  Musculoskeletal Back pain?: Yes Joint pain?: No  Neurological Headaches?: No Dizziness?: No  Psychologic Depression?: No Anxiety?: No  Physical Exam: BP 100/65   Pulse (!) 112   Resp 16   Ht 5\' 11"  (1.803 m)   Wt 200 lb (90.7 kg)   SpO2 96%   BMI 27.89 kg/m   Constitutional:  Alert and oriented, No acute distress. HEENT: [place] AT, moist mucus membranes.  Trachea midline, no masses. Cardiovascular: No clubbing, cyanosis, or edema. Respiratory: Normal respiratory effort, no increased work of breathing. GI: Abdomen is soft, nontender, nondistended, no abdominal masses GU: No CVA tenderness Lymph: No cervical or inguinal lymphadenopathy. Skin: No rashes, bruises or suspicious lesions. Neurologic: Grossly intact, no focal deficits, moving all 4 extremities. Psychiatric: Normal mood and affect.   Urinalysis Dipstick negative; microscopy negative   Assessment & Plan:   84 year old male with stable lower urinary tract symptoms on finasteride and alfuzosin.  He did not need a refill on finasteride which is prescribed by Dr. Raechel Ache.  His alfuzosin was refilled.  PVR by bladder scan was stable at 199 mL. Follow-up annually or as needed for any significant change in his voiding pattern.    Abbie Sons, [place] [address] [place], [place] [address] [phone]

## 2018-04-11 DIAGNOSIS — M1712 Unilateral primary osteoarthritis, left knee: Secondary | ICD-10-CM | POA: Insufficient documentation

## 2018-04-21 ENCOUNTER — Emergency Department
Admission: EM | Admit: 2018-04-21 | Discharge: 2018-04-21 | Disposition: A | Discharge status: Home / Self Care | Payer: Medicare Other | Attending: Emergency Medicine | Admitting: Emergency Medicine

## 2018-04-21 ENCOUNTER — Other Ambulatory Visit: Payer: Self-pay

## 2018-04-21 DIAGNOSIS — I10 Essential (primary) hypertension: Secondary | ICD-10-CM | POA: Insufficient documentation

## 2018-04-21 DIAGNOSIS — Z79899 Other long term (current) drug therapy: Secondary | ICD-10-CM | POA: Insufficient documentation

## 2018-04-21 DIAGNOSIS — L7621 Postprocedural hemorrhage and hematoma of skin and subcutaneous tissue following a dermatologic procedure: Secondary | ICD-10-CM | POA: Diagnosis not present

## 2018-04-21 DIAGNOSIS — Z85828 Personal history of other malignant neoplasm of skin: Secondary | ICD-10-CM | POA: Diagnosis not present

## 2018-04-21 DIAGNOSIS — Z7982 Long term (current) use of aspirin: Secondary | ICD-10-CM | POA: Insufficient documentation

## 2018-04-21 DIAGNOSIS — Z7901 Long term (current) use of anticoagulants: Secondary | ICD-10-CM | POA: Diagnosis not present

## 2018-04-21 DIAGNOSIS — Z950 Presence of cardiac pacemaker: Secondary | ICD-10-CM | POA: Insufficient documentation

## 2018-04-21 DIAGNOSIS — R58 Hemorrhage, not elsewhere classified: Secondary | ICD-10-CM | POA: Diagnosis present

## 2018-04-21 LAB — BASIC METABOLIC PANEL
ANION GAP: 7 (ref 5–15)
BUN: 20 mg/dL (ref 8–23)
CALCIUM: 9.4 mg/dL (ref 8.9–10.3)
CO2: 25 mmol/L (ref 22–32)
CREATININE: 0.99 mg/dL (ref 0.61–1.24)
Chloride: 105 mmol/L (ref 98–111)
Glucose, Bld: 116 mg/dL — ABNORMAL HIGH (ref 70–99)
Potassium: 4.2 mmol/L (ref 3.5–5.1)
SODIUM: 137 mmol/L (ref 135–145)

## 2018-04-21 LAB — CBC
HEMATOCRIT: 43.5 % (ref 39.0–52.0)
Hemoglobin: 14.4 g/dL (ref 13.0–17.0)
MCH: 31.9 pg (ref 26.0–34.0)
MCHC: 33.1 g/dL (ref 30.0–36.0)
MCV: 96.2 fL (ref 80.0–100.0)
NRBC: 0 % (ref 0.0–0.2)
PLATELETS: 223 10*3/uL (ref 150–400)
RBC: 4.52 MIL/uL (ref 4.22–5.81)
RDW: 13.8 % (ref 11.5–15.5)
WBC: 5.5 10*3/uL (ref 4.0–10.5)

## 2018-04-21 LAB — PROTIME-INR
INR: 1.31
PROTHROMBIN TIME: 16.2 s — AB (ref 11.4–15.2)

## 2018-04-21 MED ORDER — SODIUM CHLORIDE 0.9 % IV BOLUS: 1000.0000 mL | Freq: Once | INTRAVENOUS | Status: DC

## 2018-04-21 NOTE — ED Triage Notes (Signed)
Pt states he had a carcinoma removed from the left side of his face on 10/8 and had to go back to the dermatologist twice due to bleeding issues from the site. Pt is on eliguis and told the dermatologist this but no changes made. Pt has noted bleeding from the site on arrival.

## 2018-04-21 NOTE — ED Notes (Signed)
Pressure dressing applied best as possible to area.

## 2018-04-21 NOTE — ED Provider Notes (Signed)
[facility] Emergency Department Provider Note   ____________________________________________   I have reviewed the triage vital signs and the nursing notes.   HISTORY  Chief Complaint Coagulation Disorder   History limited by: Not Limited   HPI Christopher [name] is a 84 y.o. male who presents to the emergency department today because of concerns for continued bleeding from site of her recent skin cancer removal to his left cheek.  He states he had this procedure done 5 days ago.  He is on Eliquis.  He has had bleeding ever since.  He has been back to the dermatologist multiple times.  This morning he continued to have bleeding.  Family did put some Steri-Strips and skin glue over the wound.  Patient has not had any weakness or shortness of breath.    Per medical record review patient is on eliquis  Past Medical History:  Diagnosis Date  . BPH (benign prostatic hyperplasia)   . ED (erectile dysfunction)   . Gilbert disease   . HLD (hyperlipidemia)   . Hypertension   . Macular degeneration   . Osteopenia   . Sensorineural hearing loss     Patient Active Problem List   Diagnosis Date Noted  . Chronic right-sided low back pain without sciatica 06/14/2015  . Dizziness 03/10/2015  . Symptomatic sinus bradycardia 03/10/2015  . Syncope and collapse 02/28/2015  . Symptomatic bradycardia 02/28/2015  . HTN (hypertension) 02/28/2015  . Hypercholesteremia 02/28/2015  . Benign prostatic hyperplasia with urinary frequency 02/28/2015  . Alcohol use 02/28/2015  . Other specified health status 02/28/2015  . Asymptomatic proteinuria 01/04/2015  . Chronic leukopenia 11/14/2013  . Glucose intolerance (impaired glucose tolerance) 11/14/2013  . High risk medication use 11/14/2013  . Dupuytren contracture 09/29/2013  . ED (erectile dysfunction) 09/29/2013  . Gilbert syndrome 09/29/2013  . Macular degeneration 09/29/2013  . Osteopenia 09/29/2013  . SNHL  (sensorineural hearing loss) 09/29/2013    Past Surgical History:  Procedure Laterality Date  . CATARACT EXTRACTION    . HEMORRHOIDECTOMY WITH HEMORRHOID BANDING    . KNEE ARTHROSCOPY    . PACEMAKER INSERTION N/A 03/03/2015   Procedure: INSERTION DUAL LEAD PACEMAKER;  Surgeon: Isaias Cowman, MD;  Location: [facility] ORS;  Service: Cardiovascular;  Laterality: N/A;  . TONSILLECTOMY      Prior to Admission medications   Medication Sig Start Date End Date Taking? Authorizing Provider  alfuzosin (UROXATRAL) 10 MG 24 hr tablet Take 1 tablet (10 mg total) by mouth daily with breakfast. 01/22/18   Stoioff, Ronda Fairly, MD  apixaban (ELIQUIS) 5 MG TABS tablet Take 5 mg by mouth 2 (two) times daily.    [provider]  aspirin 325 MG tablet Take 1 tablet by mouth daily.    [provider]  atorvastatin (LIPITOR) 10 MG tablet Take 1 tablet by mouth daily. 11/18/14   [provider]  Cholecalciferol (VITAMIN D3) 1000 UNITS CAPS Take 1 capsule by mouth daily.    [provider]  finasteride (PROSCAR) 5 MG tablet Take 1 tablet by mouth daily. 11/18/14   [provider]  hydrochlorothiazide (HYDRODIURIL) 25 MG tablet Take 1 tablet (25 mg total) by mouth daily. 03/04/15   Fritzi Mandes, MD  lisinopril (PRINIVIL,ZESTRIL) 20 MG tablet Take 0.5 tablets (10 mg total) by mouth daily. 03/04/15 03/03/16  Fritzi Mandes, MD  Multiple Vitamin (MULTI-VITAMINS) TABS Take 1 tablet by mouth daily.    [provider]  Multiple Vitamins-Minerals (OCUVITE PRESERVISION PO) Take 1 tablet by  mouth 2 (two) times daily.    [provider]  Omega-3 Fatty Acids (FISH OIL) 1000 MG CAPS Take 1 tablet by mouth 2 (two) times daily.    [provider]  ZOSTAVAX 96789 UNT/0.65ML injection Inject 1 vial into the muscle once. 02/28/15   [provider]    Allergies Patient has no known allergies.  Family History  Problem Relation Age of Onset  . Diabetes Mother    . Lung cancer Father   . Cancer Paternal Uncle   . Prostate cancer Neg Hx   . Bladder Cancer Neg Hx   . Kidney cancer Neg Hx     Social History Social History   Tobacco Use  . Smoking status: Never Smoker  . Smokeless tobacco: Never Used  Substance Use Topics  . Alcohol use: Yes  . Drug use: No    Review of Systems Constitutional: No fever/chills Eyes: No visual changes. ENT: No sore throat. Cardiovascular: Denies chest pain. Respiratory: Denies shortness of breath. Gastrointestinal: No abdominal pain.  No nausea, no vomiting.  No diarrhea.   Genitourinary: Negative for dysuria. Musculoskeletal: Negative for back pain. Skin: Positive for bleeding to left cheek. Neurological: Negative for headaches, focal weakness or numbness.  ____________________________________________   PHYSICAL EXAM:  VITAL SIGNS: ED Triage Vitals  Enc Vitals Group     BP 04/21/18 1020 108/71     Pulse Rate 04/21/18 1020 79     Resp 04/21/18 1020 18     Temp 04/21/18 1020 (!) 97.4 F (36.3 C)     Temp Source 04/21/18 1020 Oral     SpO2 04/21/18 1020 96 %     Weight --      Height --      Head Circumference --      Peak Flow --      Pain Score 04/21/18 1014 0   Constitutional: Alert and oriented.  Eyes: Conjunctivae are normal.  ENT      Head: Normocephalic and atraumatic.      Nose: No congestion/rhinnorhea.      Mouth/Throat: Mucous membranes are moist.      Neck: No stridor. Hematological/Lymphatic/Immunilogical: No cervical lymphadenopathy. Cardiovascular: Normal rate, regular rhythm.  No murmurs, rubs, or gallops.  Respiratory: Normal respiratory effort without tachypnea nor retractions. Breath sounds are clear and equal bilaterally. No wheezes/rales/rhonchi. Gastrointestinal: Soft and non tender. No rebound. No guarding.  Genitourinary: Deferred Musculoskeletal: Normal range of motion in all extremities. No lower extremity edema. Neurologic:  Normal speech and language. No  gross focal neurologic deficits are appreciated.  Skin:  Left cheek procedure site with steristreps. No active bleeding noted on my exam. Psychiatric: Mood and affect are normal. Speech and behavior are normal. Patient exhibits appropriate insight and judgment.  ____________________________________________    LABS (pertinent positives/negatives)  INR 1.31 CBC wbc 5.5, hgb 14.4, plt 223 BMP wnl except glu 116  ____________________________________________   EKG  None  ____________________________________________    RADIOLOGY  None   ____________________________________________   PROCEDURES  Procedures  ____________________________________________   INITIAL IMPRESSION / ASSESSMENT AND PLAN / ED COURSE  Pertinent labs & imaging results that were available during my care of the patient were reviewed by me and considered in my medical decision making (see chart for details).   Patient presented to the emergency department today because of concerns for continued bleeding from site of the left cheek skin cancer removal.  Patient did have pressure dressing applied out in triage.  By the time  of my exam patient with no active bleeding.  Patient's blood work without concerning anemia.  Will plan to discharge to follow-up with dermatology.  ____________________________________________   FINAL CLINICAL IMPRESSION(S) / ED DIAGNOSES  Final diagnoses:  Postoperative hemorrhage of skin following dermatologic procedure     Note: This dictation was prepared with Dragon dictation. Any transcriptional errors that result from this process are unintentional     Nance Pear, MD 04/21/18 1426

## 2018-04-21 NOTE — ED Notes (Signed)
Per pt, he has been having bleeding problem with his left cheek where a squamous cell spot was removed. Has been back to dermatology a few times. The last time they took out the stitches, cauterized some areas, replaced the stitches. Started bleeding again today and his family member added steri-strips and "new skin" ( like dermabond otc) which she said did not help. Bleeding currently controlled with pressure dressing.

## 2018-04-21 NOTE — Discharge Instructions (Addendum)
Please seek medical attention for any high fevers, chest pain, shortness of breath, change in behavior, persistent vomiting, bloody stool or any other new or concerning symptoms.  

## 2018-05-18 ENCOUNTER — Encounter: Payer: Self-pay | Admitting: Medical Oncology

## 2018-05-18 ENCOUNTER — Emergency Department
Admission: EM | Admit: 2018-05-18 | Discharge: 2018-05-18 | Disposition: A | Discharge status: Home / Self Care | Payer: Medicare Other | Attending: Emergency Medicine | Admitting: Emergency Medicine

## 2018-05-18 DIAGNOSIS — E7800 Pure hypercholesterolemia, unspecified: Secondary | ICD-10-CM | POA: Diagnosis not present

## 2018-05-18 DIAGNOSIS — Z7982 Long term (current) use of aspirin: Secondary | ICD-10-CM | POA: Diagnosis not present

## 2018-05-18 DIAGNOSIS — E785 Hyperlipidemia, unspecified: Secondary | ICD-10-CM | POA: Insufficient documentation

## 2018-05-18 DIAGNOSIS — R000 Tachycardia, unspecified: Secondary | ICD-10-CM | POA: Insufficient documentation

## 2018-05-18 DIAGNOSIS — I10 Essential (primary) hypertension: Secondary | ICD-10-CM | POA: Insufficient documentation

## 2018-05-18 DIAGNOSIS — Z79899 Other long term (current) drug therapy: Secondary | ICD-10-CM | POA: Insufficient documentation

## 2018-05-18 DIAGNOSIS — Z7901 Long term (current) use of anticoagulants: Secondary | ICD-10-CM | POA: Diagnosis not present

## 2018-05-18 DIAGNOSIS — Z950 Presence of cardiac pacemaker: Secondary | ICD-10-CM | POA: Insufficient documentation

## 2018-05-18 LAB — CBC WITH DIFFERENTIAL/PLATELET
Abs Immature Granulocytes: 0.02 10*3/uL (ref 0.00–0.07)
BASOS ABS: 0 10*3/uL (ref 0.0–0.1)
BASOS PCT: 1 %
EOS ABS: 0.2 10*3/uL (ref 0.0–0.5)
EOS PCT: 3 %
HEMATOCRIT: 40.6 % (ref 39.0–52.0)
Hemoglobin: 13.1 g/dL (ref 13.0–17.0)
Immature Granulocytes: 0 %
LYMPHS ABS: 0.8 10*3/uL (ref 0.7–4.0)
Lymphocytes Relative: 16 %
MCH: 31.6 pg (ref 26.0–34.0)
MCHC: 32.3 g/dL (ref 30.0–36.0)
MCV: 98.1 fL (ref 80.0–100.0)
Monocytes Absolute: 0.6 10*3/uL (ref 0.1–1.0)
Monocytes Relative: 12 %
NRBC: 0 % (ref 0.0–0.2)
Neutro Abs: 3.3 10*3/uL (ref 1.7–7.7)
Neutrophils Relative %: 68 %
PLATELETS: 218 10*3/uL (ref 150–400)
RBC: 4.14 MIL/uL — ABNORMAL LOW (ref 4.22–5.81)
RDW: 13.2 % (ref 11.5–15.5)
WBC: 4.9 10*3/uL (ref 4.0–10.5)

## 2018-05-18 LAB — BASIC METABOLIC PANEL
ANION GAP: 6 (ref 5–15)
BUN: 14 mg/dL (ref 8–23)
CALCIUM: 9.2 mg/dL (ref 8.9–10.3)
CO2: 32 mmol/L (ref 22–32)
Chloride: 102 mmol/L (ref 98–111)
Creatinine, Ser: 0.92 mg/dL (ref 0.61–1.24)
GLUCOSE: 111 mg/dL — AB (ref 70–99)
Potassium: 4.5 mmol/L (ref 3.5–5.1)
Sodium: 140 mmol/L (ref 135–145)

## 2018-05-18 LAB — TROPONIN I

## 2018-05-18 NOTE — ED Notes (Signed)
Hx a fib. States went to MD 2 days ago and had metoprolol dose doubled. States checks BP and HR at home often and has noticed that HR has been in 130's and then in 60's. Denies CP or SOB.  A&O, no distress noted. Speaking in complete sentences.

## 2018-05-18 NOTE — ED Provider Notes (Signed)
[facility] Emergency Department Provider Note ____________________________________________   I have reviewed the triage vital signs and the triage nursing note.  HISTORY  Chief Complaint Tachycardia   Historian Patient and daughter  HPI Christopher [name] is a 84 y.o. male with a history of bradycardia, now with a pacemaker, follows with Dr. Ubaldo Glassing for cardiology, and Dr. Dorthula Perfect for primary care.  He presents today with concern about tachycardia in the mornings when he wakes up.  He had noticed this sounds like several weeks ago and discussed with Dr. Dorthula Perfect this week who increased his dose of metoprolol 25 mg twice daily to 50 mg twice daily.  For the last 3 days he has kept close documentation of heart rate and blood pressure throughout the day and over the last 3 days his heart rate has been 07/30/1926 in the mornings when he woke up and then after he took his metoprolol it did drop to somewhere between 60 and 80.  Currently he is not having any palpitations or sensation of heart racing or chest pain.  Patient takes Eliquis.  No syncope.       Past Medical History:  Diagnosis Date  . BPH (benign prostatic hyperplasia)   . ED (erectile dysfunction)   . Gilbert disease   . HLD (hyperlipidemia)   . Hypertension   . Macular degeneration   . Osteopenia   . Sensorineural hearing loss     Patient Active Problem List   Diagnosis Date Noted  . Chronic right-sided low back pain without sciatica 06/14/2015  . Dizziness 03/10/2015  . Symptomatic sinus bradycardia 03/10/2015  . Syncope and collapse 02/28/2015  . Symptomatic bradycardia 02/28/2015  . HTN (hypertension) 02/28/2015  . Hypercholesteremia 02/28/2015  . Benign prostatic hyperplasia with urinary frequency 02/28/2015  . Alcohol use 02/28/2015  . Other specified health status 02/28/2015  . Asymptomatic proteinuria 01/04/2015  . Chronic leukopenia 11/14/2013  . Glucose intolerance (impaired glucose  tolerance) 11/14/2013  . High risk medication use 11/14/2013  . Dupuytren contracture 09/29/2013  . ED (erectile dysfunction) 09/29/2013  . Gilbert syndrome 09/29/2013  . Macular degeneration 09/29/2013  . Osteopenia 09/29/2013  . SNHL (sensorineural hearing loss) 09/29/2013    Past Surgical History:  Procedure Laterality Date  . CATARACT EXTRACTION    . HEMORRHOIDECTOMY WITH HEMORRHOID BANDING    . KNEE ARTHROSCOPY    . PACEMAKER INSERTION N/A 03/03/2015   Procedure: INSERTION DUAL LEAD PACEMAKER;  Surgeon: Isaias Cowman, MD;  Location: [facility] ORS;  Service: Cardiovascular;  Laterality: N/A;  . TONSILLECTOMY      Prior to Admission medications   Medication Sig Start Date End Date Taking? Authorizing Provider  alfuzosin (UROXATRAL) 10 MG 24 hr tablet Take 1 tablet (10 mg total) by mouth daily with breakfast. 01/22/18   Stoioff, Ronda Fairly, MD  apixaban (ELIQUIS) 5 MG TABS tablet Take 5 mg by mouth 2 (two) times daily.    [provider]  aspirin 325 MG tablet Take 1 tablet by mouth daily.    [provider]  atorvastatin (LIPITOR) 10 MG tablet Take 1 tablet by mouth daily. 11/18/14   [provider]  Cholecalciferol (VITAMIN D3) 1000 UNITS CAPS Take 1 capsule by mouth daily.    [provider]  finasteride (PROSCAR) 5 MG tablet Take 1 tablet by mouth daily. 11/18/14   [provider]  hydrochlorothiazide (HYDRODIURIL) 25 MG tablet Take 1 tablet (25 mg total) by mouth daily. 03/04/15   Fritzi Mandes, MD  lisinopril (  PRINIVIL,ZESTRIL) 20 MG tablet Take 0.5 tablets (10 mg total) by mouth daily. 03/04/15 03/03/16  Fritzi Mandes, MD  Multiple Vitamin (MULTI-VITAMINS) TABS Take 1 tablet by mouth daily.    [provider]  Multiple Vitamins-Minerals (OCUVITE PRESERVISION PO) Take 1 tablet by mouth 2 (two) times daily.    [provider]  Omega-3 Fatty Acids (FISH OIL) 1000 MG CAPS Take 1 tablet by mouth 2 (two) times daily.    [provider]  ZOSTAVAX 75643 UNT/0.65ML injection Inject 1 vial into the muscle once. 02/28/15   [provider]    No Known Allergies  Family History  Problem Relation Age of Onset  . Diabetes Mother   . Lung cancer Father   . Cancer Paternal Uncle   . Prostate cancer Neg Hx   . Bladder Cancer Neg Hx   . Kidney cancer Neg Hx     Social History Social History   Tobacco Use  . Smoking status: Never Smoker  . Smokeless tobacco: Never Used  Substance Use Topics  . Alcohol use: Yes  . Drug use: No    Review of Systems  Constitutional: Negative for fever. Eyes: Negative for visual changes. ENT: Negative for sore throat. Cardiovascular: Negative for chest pain. Respiratory: Negative for shortness of breath. Gastrointestinal: Negative for abdominal pain, vomiting and diarrhea. Genitourinary: Negative for dysuria. Musculoskeletal: Negative for back pain. Skin: Negative for rash. Neurological: Negative for headache.  ____________________________________________   PHYSICAL EXAM:  VITAL SIGNS: ED Triage Vitals [05/18/18 1059]  Enc Vitals Group     BP      Pulse      Resp      Temp      Temp src      SpO2      Weight 198 lb 6.6 oz (90 kg)     Height      Head Circumference      Peak Flow      Pain Score 0     Pain Loc      Pain Edu?      Excl. in [place]?      Constitutional: Alert and oriented.  HEENT      Head: Normocephalic and atraumatic.      Eyes: Conjunctivae are normal. Pupils equal and round.       Ears:         Nose: No congestion/rhinnorhea.      Mouth/Throat: Mucous membranes are moist.      Neck: No stridor. Cardiovascular/Chest: Normal rate, irregularly irregular rhythm.  No murmurs, rubs, or gallops. Respiratory: Normal respiratory effort without tachypnea nor retractions. Breath sounds are clear and equal bilaterally. No wheezes/rales/rhonchi. Gastrointestinal: Soft. No distention, no guarding, no rebound. Nontender.     Genitourinary/rectal:Deferred Musculoskeletal: Nontender with normal range of motion in all extremities. No joint effusions.  No lower extremity tenderness.  No edema. Neurologic:  Normal speech and language. No gross or focal neurologic deficits are appreciated. Skin:  Skin is warm, dry and intact. No rash noted. Psychiatric: Mood and affect are normal. Speech and behavior are normal. Patient exhibits appropriate insight and judgment.   ____________________________________________  LABS (pertinent positives/negatives) I, Lisa Roca, MD the attending physician have reviewed the labs noted below.  Labs Reviewed  BASIC METABOLIC PANEL - Abnormal; Notable for the following components:      Result Value   Glucose, Bld 111 (*)    All other components within normal limits  CBC WITH DIFFERENTIAL/PLATELET - Abnormal; Notable for  the following components:   RBC 4.14 (*)    All other components within normal limits  TROPONIN I    ____________________________________________    EKG I, Lisa Roca, MD, the attending physician have personally viewed and interpreted all ECGs.  83 bpm.  Atrial flutter with what appears to be a bigeminy of atrial beat followed by a ventricular beat versus atrial beat with aberrant conduction.  Specifically.  Normal axis. ____________________________________________  RADIOLOGY   None __________________________________________  PROCEDURES  Procedure(s) performed: None  Procedures  Critical Care performed: None   ____________________________________________  ED COURSE / ASSESSMENT AND PLAN  Pertinent labs & imaging results that were available during my care of the patient were reviewed by me and considered in my medical decision making (see chart for details).    Patient is overall well-appearing without symptoms at present.  His concern is tachycardia upon waking even despite change in medications of doubling the metoprolol dose 3 days  ago.  He comes in with documentation of such.  His EKG today does not show tachycardia, heart rates in the 80s and he does have what looks like a bigeminy pattern.  In any case, letter studies are reassuring.  I discussed with Dr. Saralyn Pilar, we discussed either leaving the dose be, however I do think the patient is probably accurate in what he has been documented at home, so we will increase to 75 mg twice daily and have him call to follow-up with Dr. Ubaldo Glassing this week.     CONSULTATIONS:   Dr. Saralyn Pilar, cardiology by phone.   Patient / Family / Caregiver informed of clinical course, medical decision-making process, and agree with plan.   I discussed return precautions, follow-up instructions, and discharge instructions with patient and/or family.  Discharge Instructions : Increase your dose of metoprolol to 75 mg twice daily.  Call Dr. Bethanne Ginger office on Monday to make a follow-up appointment.  Return to the emergency department immediately for any worsening condition including passing out, dizziness, breathlessness or shortness of breath, fever, weakness, numbness, chest pain, or any other symptoms concerning to you.    ___________________________________________   FINAL CLINICAL IMPRESSION(S) / ED DIAGNOSES   Final diagnoses:  Tachycardia      ___________________________________________         Note: This dictation was prepared with Dragon dictation. Any transcriptional errors that result from this process are unintentional    Lisa Roca, MD 05/18/18 1257

## 2018-05-18 NOTE — ED Triage Notes (Signed)
Pt reports that he seen his PCP on the 7th bc his HR was elevated, they increased his metoprolol from 25mg  to 50mg . Pt reports since then he wakes up in the am and his HR is in the 120's and when he takes his meds the HR drops to 60's. Pt reports he feels fine, denies chest pain. Denies feeling like his heart is racing. Pt was told that he needed to come to the ER by his daughter.

## 2018-05-18 NOTE — Discharge Instructions (Addendum)
Increase your dose of metoprolol to 75 mg twice daily.  Call Dr. Bethanne Ginger office on Monday to make a follow-up appointment.  Return to the emergency department immediately for any worsening condition including passing out, dizziness, breathlessness or shortness of breath, fever, weakness, numbness, chest pain, or any other symptoms concerning to you.

## 2018-06-07 ENCOUNTER — Other Ambulatory Visit: Payer: Self-pay

## 2018-06-07 ENCOUNTER — Emergency Department
Admission: EM | Admit: 2018-06-07 | Discharge: 2018-06-07 | Disposition: A | Discharge status: Home / Self Care | Payer: Medicare Other | Attending: Emergency Medicine | Admitting: Emergency Medicine

## 2018-06-07 ENCOUNTER — Emergency Department: Payer: Medicare Other

## 2018-06-07 DIAGNOSIS — X58XXXA Exposure to other specified factors, initial encounter: Secondary | ICD-10-CM | POA: Insufficient documentation

## 2018-06-07 DIAGNOSIS — Z79899 Other long term (current) drug therapy: Secondary | ICD-10-CM | POA: Insufficient documentation

## 2018-06-07 DIAGNOSIS — Z7982 Long term (current) use of aspirin: Secondary | ICD-10-CM | POA: Diagnosis not present

## 2018-06-07 DIAGNOSIS — S40022A Contusion of left upper arm, initial encounter: Secondary | ICD-10-CM | POA: Insufficient documentation

## 2018-06-07 DIAGNOSIS — Y999 Unspecified external cause status: Secondary | ICD-10-CM | POA: Diagnosis not present

## 2018-06-07 DIAGNOSIS — Y939 Activity, unspecified: Secondary | ICD-10-CM | POA: Diagnosis not present

## 2018-06-07 DIAGNOSIS — Y929 Unspecified place or not applicable: Secondary | ICD-10-CM | POA: Insufficient documentation

## 2018-06-07 DIAGNOSIS — I10 Essential (primary) hypertension: Secondary | ICD-10-CM | POA: Insufficient documentation

## 2018-06-07 IMAGING — CR DG HUMERUS 2V *L*
1 series · 4 of 4 positions shown · non-contrast
Comparison: None.

CLINICAL DATA: Left upper arm pain without known injury.

EXAM:
LEFT HUMERUS - 2+ VIEW

[Series 1: dg humerus left · 0.14mm/px · 4 of 4 slices shown]
[im 1/4]
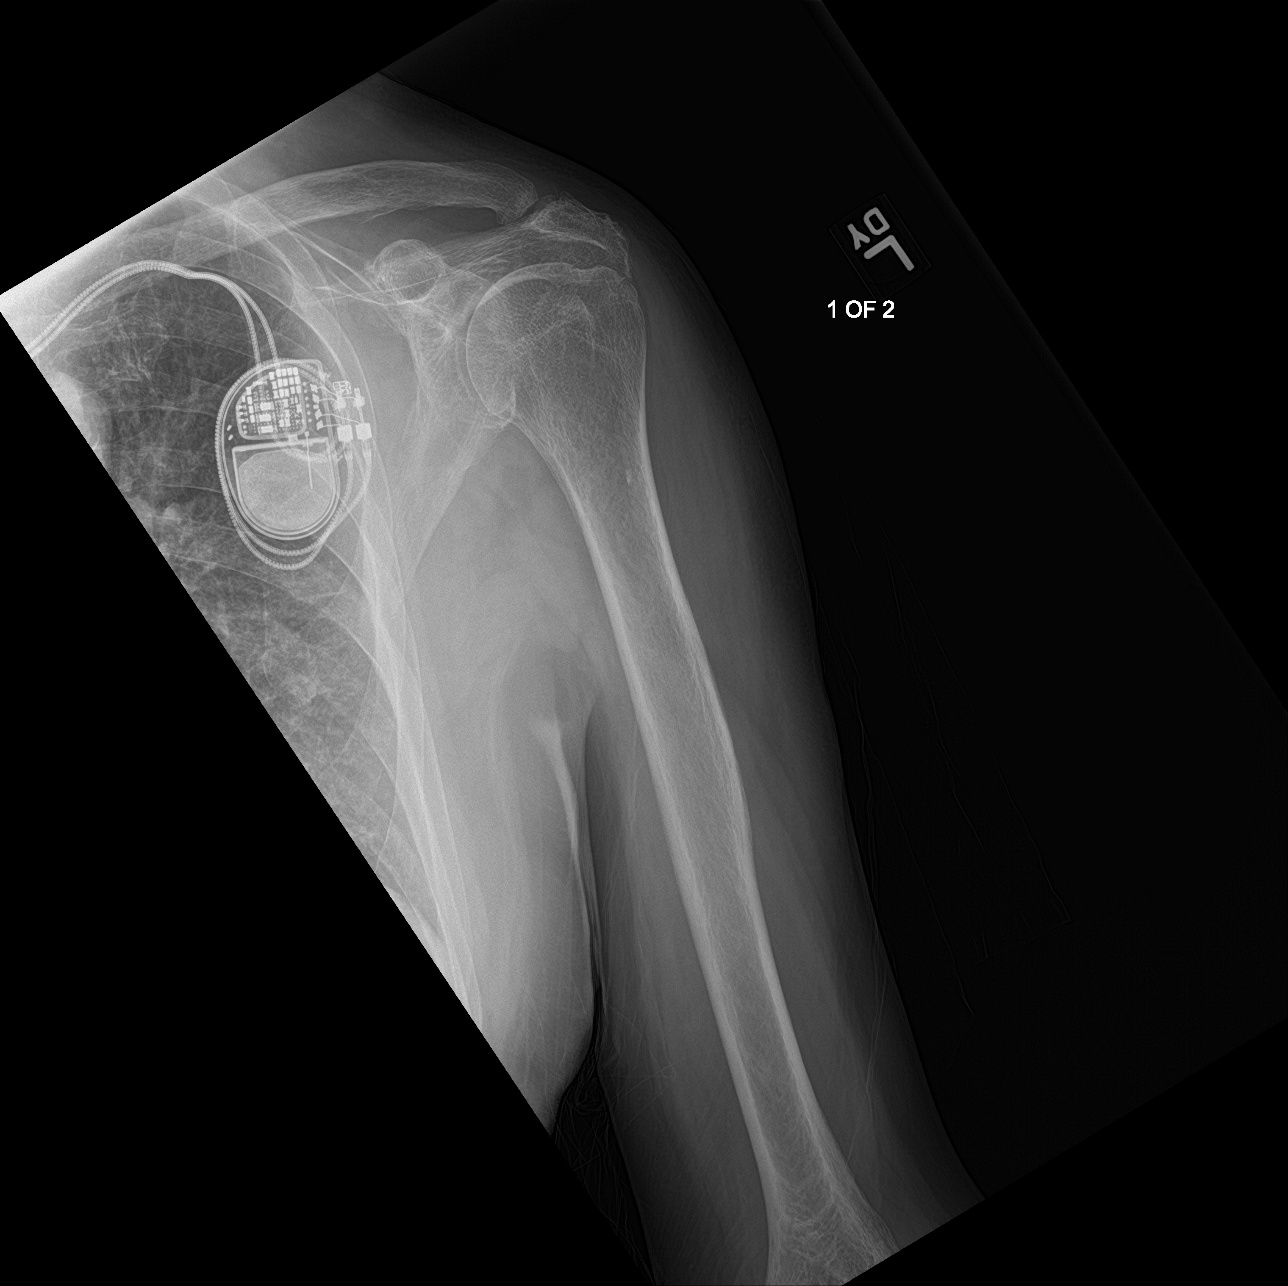
[im 2/4]
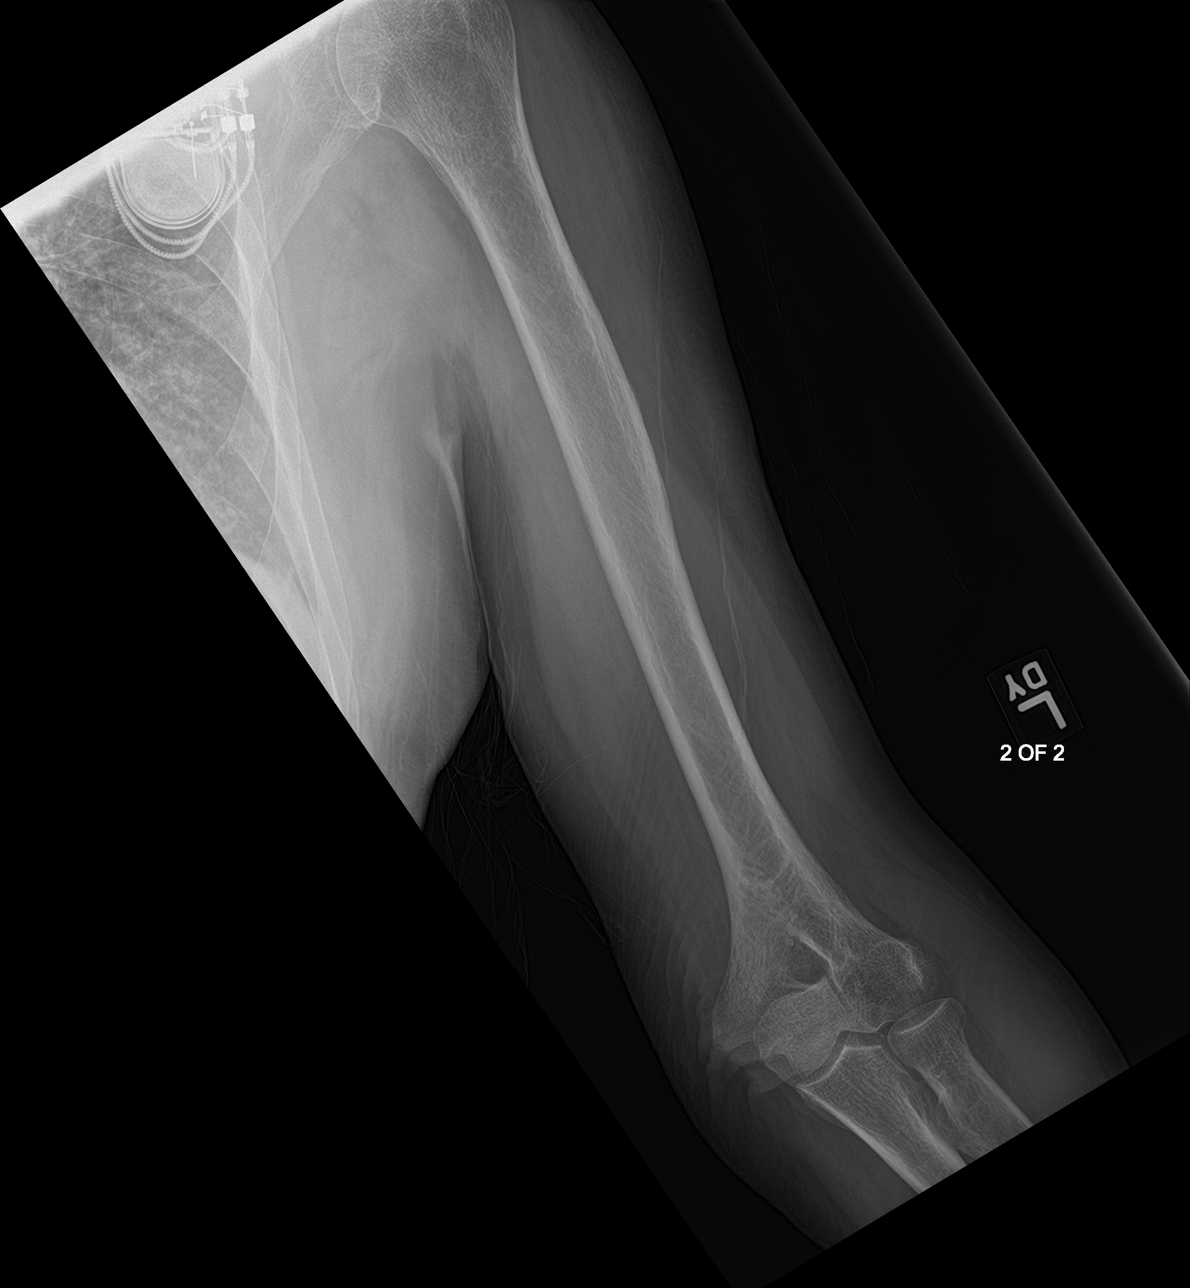
[im 3/4]
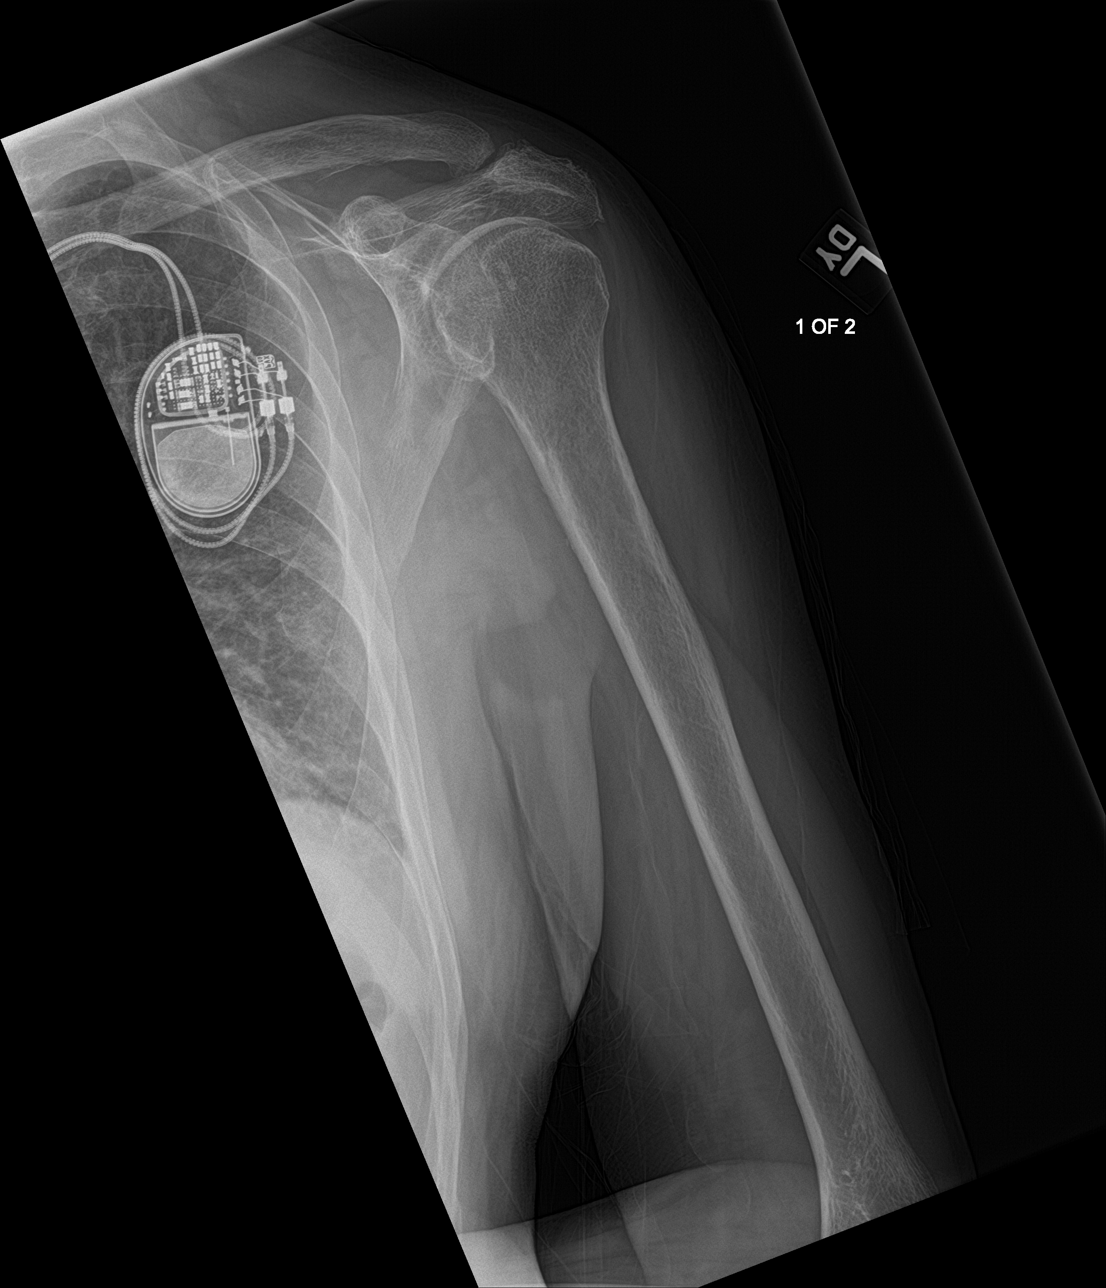
[im 4/4]
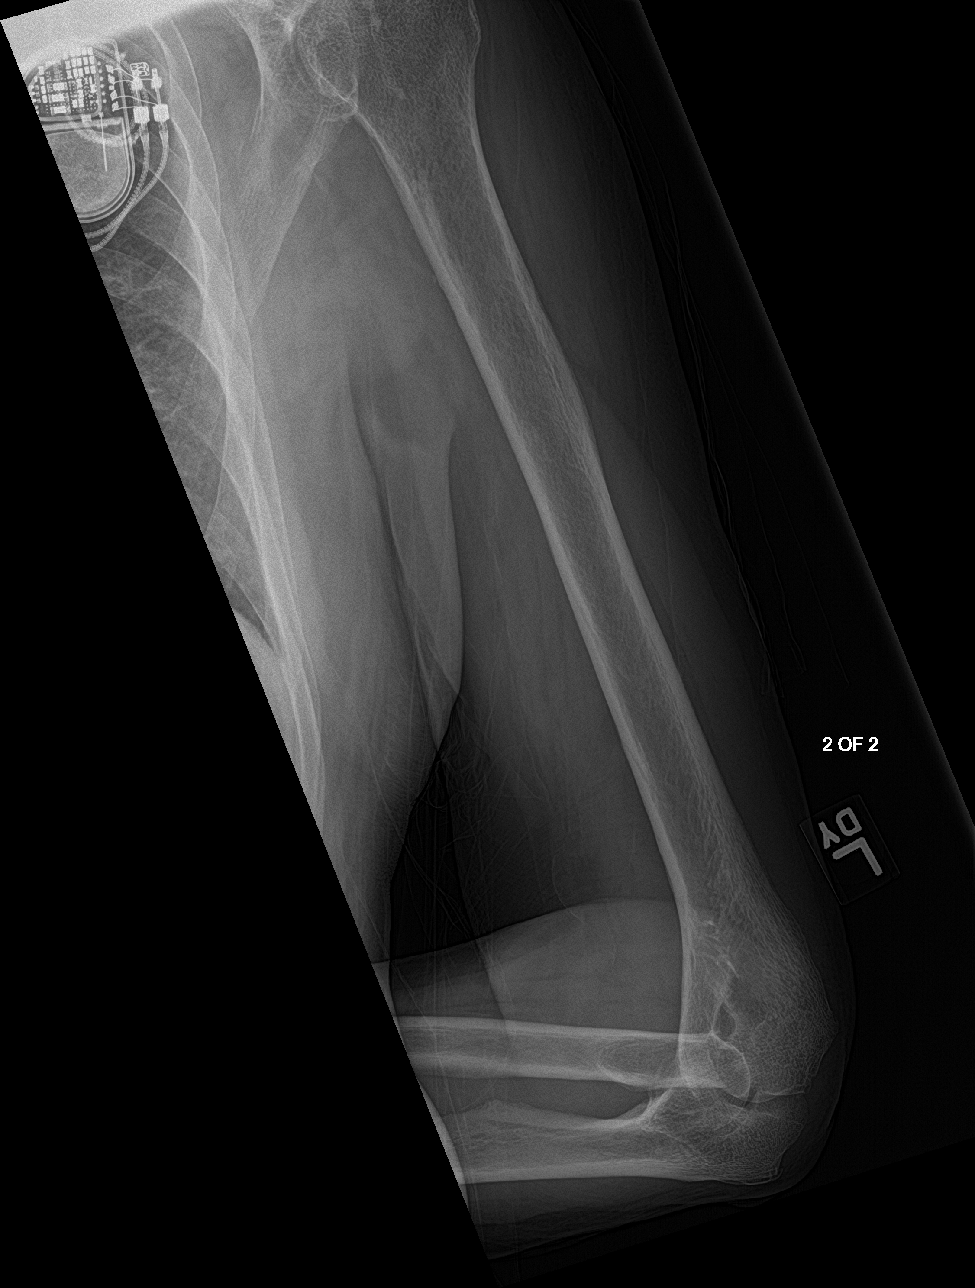

[4 of 4 positions shown; findings below may reference images not displayed]

FINDINGS: There is no evidence of fracture or other focal bone lesions. Soft
tissues are unremarkable.
IMPRESSION: Negative.

## 2018-06-07 NOTE — Discharge Instructions (Addendum)
Your x-ray was negative.  Ecchymosis or bruising is common of minor trauma when you are taking Eliquis.  Follow discharge care instruction continue previous medications.  Follow-up with PCP in 3 to 5 days.  He should notice color changes as the bruise start fading.

## 2018-06-07 NOTE — ED Notes (Signed)
Bruising left upper arm for 3 days.  Says it is spreading down toward elbow.  Has bruise with yellowing around it.  In nad and denies pain.

## 2018-06-07 NOTE — ED Provider Notes (Signed)
[facility] Emergency Department Provider Note   ____________________________________________   First MD Initiated Contact with Patient 06/07/18 1059     (approximate)  I have reviewed the triage vital signs and the nursing notes.   HISTORY  Chief Complaint bruising to the left upper arm    HPI Christopher [name] is a 84 y.o. male patient presents with 2 to 3 days of ecchymosis to the right medial aspect of the left upper arm.  Patient denies provocative incident.  Patient denies pain or decreased range of motion.  Patient does take Eliquis.  Last protime-INR 1 month ago at 16.2.  Patient state no change in medications since last blood drawn.  Past Medical History:  Diagnosis Date  . BPH (benign prostatic hyperplasia)   . ED (erectile dysfunction)   . Gilbert disease   . HLD (hyperlipidemia)   . Hypertension   . Macular degeneration   . Osteopenia   . Sensorineural hearing loss     Patient Active Problem List   Diagnosis Date Noted  . Chronic right-sided low back pain without sciatica 06/14/2015  . Dizziness 03/10/2015  . Symptomatic sinus bradycardia 03/10/2015  . Syncope and collapse 02/28/2015  . Symptomatic bradycardia 02/28/2015  . HTN (hypertension) 02/28/2015  . Hypercholesteremia 02/28/2015  . Benign prostatic hyperplasia with urinary frequency 02/28/2015  . Alcohol use 02/28/2015  . Other specified health status 02/28/2015  . Asymptomatic proteinuria 01/04/2015  . Chronic leukopenia 11/14/2013  . Glucose intolerance (impaired glucose tolerance) 11/14/2013  . High risk medication use 11/14/2013  . Dupuytren contracture 09/29/2013  . ED (erectile dysfunction) 09/29/2013  . Gilbert syndrome 09/29/2013  . Macular degeneration 09/29/2013  . Osteopenia 09/29/2013  . SNHL (sensorineural hearing loss) 09/29/2013    Past Surgical History:  Procedure Laterality Date  . CATARACT EXTRACTION    . HEMORRHOIDECTOMY WITH HEMORRHOID BANDING     . KNEE ARTHROSCOPY    . PACEMAKER INSERTION N/A 03/03/2015   Procedure: INSERTION DUAL LEAD PACEMAKER;  Surgeon: Isaias Cowman, MD;  Location: [facility] ORS;  Service: Cardiovascular;  Laterality: N/A;  . TONSILLECTOMY      Prior to Admission medications   Medication Sig Start Date End Date Taking? Authorizing Provider  alfuzosin (UROXATRAL) 10 MG 24 hr tablet Take 1 tablet (10 mg total) by mouth daily with breakfast. 01/22/18  Yes Stoioff, Ronda Fairly, MD  apixaban (ELIQUIS) 5 MG TABS tablet Take 5 mg by mouth 2 (two) times daily.   Yes [provider]  aspirin 325 MG tablet Take 1 tablet by mouth daily.   Yes [provider]  atorvastatin (LIPITOR) 10 MG tablet Take 1 tablet by mouth daily. 11/18/14  Yes [provider]  Cholecalciferol (VITAMIN D3) 1000 UNITS CAPS Take 1 capsule by mouth daily.   Yes [provider]  metoprolol tartrate (LOPRESSOR) 25 MG tablet Take 75 mg by mouth daily.   Yes [provider]  Omega-3 Fatty Acids (FISH OIL) 1000 MG CAPS Take 1 tablet by mouth 2 (two) times daily.   Yes [provider]  finasteride (PROSCAR) 5 MG tablet Take 1 tablet by mouth daily. 11/18/14   [provider]  hydrochlorothiazide (HYDRODIURIL) 25 MG tablet Take 1 tablet (25 mg total) by mouth daily. 03/04/15   Fritzi Mandes, MD  lisinopril (PRINIVIL,ZESTRIL) 20 MG tablet Take 0.5 tablets (10 mg total) by mouth daily. 03/04/15 03/03/16  Fritzi Mandes, MD  Multiple Vitamin (MULTI-VITAMINS) TABS Take 1 tablet by mouth daily.  [provider]  Multiple Vitamins-Minerals (OCUVITE PRESERVISION PO) Take 1 tablet by mouth 2 (two) times daily.    [provider]  ZOSTAVAX 34742 UNT/0.65ML injection Inject 1 vial into the muscle once. 02/28/15   [provider]    Allergies Patient has no known allergies.  Family History  Problem Relation Age of Onset  . Diabetes Mother   . Lung cancer Father   . Cancer Paternal  Uncle   . Prostate cancer Neg Hx   . Bladder Cancer Neg Hx   . Kidney cancer Neg Hx     Social History Social History   Tobacco Use  . Smoking status: Never Smoker  . Smokeless tobacco: Never Used  Substance Use Topics  . Alcohol use: Yes  . Drug use: No    Review of Systems  Constitutional: No fever/chills Eyes: No visual changes. ENT: No sore throat. Cardiovascular: Denies chest pain. Respiratory: Denies shortness of breath. Gastrointestinal: No abdominal pain.  No nausea, no vomiting.  No diarrhea.  No constipation. Genitourinary: Negative for dysuria.  BPH. Musculoskeletal: Negative for back pain. Skin: Negative for rash. Neurological: Negative for headaches, focal weakness or numbness. Endocrine:Hyperlipidemia, hypertension, and Gilbert's disease. ____________________________________________   PHYSICAL EXAM:  VITAL SIGNS: ED Triage Vitals  Enc Vitals Group     BP 06/07/18 1046 132/81     Pulse Rate 06/07/18 1046 65     Resp 06/07/18 1046 17     Temp 06/07/18 1046 98 F (36.7 C)     Temp Source 06/07/18 1046 Oral     SpO2 06/07/18 1046 94 %     Weight 06/07/18 1047 198 lb 6.6 oz (90 kg)     Height 06/07/18 1048 5\' 11"  (1.803 m)     Head Circumference --      Peak Flow --      Pain Score 06/07/18 1046 0     Pain Loc --      Pain Edu? --      Excl. in [place]? --     Constitutional: Alert and oriented. Well appearing and in no acute distress. Hematological/Lymphatic/Immunilogical: No cervical lymphadenopathy. Cardiovascular: Normal rate, regular rhythm. Grossly normal heart sounds.  Good peripheral circulation. Respiratory: Normal respiratory effort.  No retractions. Lungs CTAB. Musculoskeletal: No lower extremity tenderness nor edema.  No joint effusions. Neurologic:  Normal speech and language. No gross focal neurologic deficits are appreciated. No gait instability. Skin:  Skin is warm, dry and intact. No rash noted.  10 cm plaque or ecchymosis medial left  upper arm.  Nontender to palpation. Psychiatric: Mood and affect are normal. Speech and behavior are normal.  ____________________________________________   LABS (all labs ordered are listed, but only abnormal results are displayed)  Labs Reviewed - No data to display ____________________________________________  EKG   ____________________________________________  RADIOLOGY  ED MD interpretation:    Official radiology report(s): Dg Humerus Left  Result Date: 06/07/2018 CLINICAL DATA:  Left upper arm pain without known injury. EXAM: LEFT HUMERUS - 2+ VIEW COMPARISON:  None. FINDINGS: There is no evidence of fracture or other focal bone lesions. Soft tissues are unremarkable. IMPRESSION: Negative. Electronically Signed   By: Marijo Conception, M.D.   On: 06/07/2018 12:14    ____________________________________________   PROCEDURES  Procedure(s) performed: None  Procedures  Critical Care performed: No  ____________________________________________   INITIAL IMPRESSION / ASSESSMENT AND PLAN / ED COURSE  As part of my medical decision making, I reviewed the following data within the  electronic MEDICAL RECORD NUMBER    Ecchymotic changes left upper arm secondary to mild trauma.  Discussed negative x-ray findings with patient.  Discussed sequela of patient bruising while taking Eliquis.  Patient advised to follow-up with PCP in 3 to 5 days.  Return to ED if condition worsens.     ____________________________________________   FINAL CLINICAL IMPRESSION(S) / ED DIAGNOSES  Final diagnoses:  Traumatic ecchymosis of left upper arm, initial encounter     ED Discharge Orders    None       Note:  This document was prepared using Dragon voice recognition software and may include unintentional dictation errors.    Sable Feil, PA-C 06/07/18 [place], [place], MD 06/10/18 [phone]

## 2018-06-07 NOTE — ED Notes (Signed)
Signature pad did not work.  Patient verbalizes understanding of instructions.

## 2018-06-07 NOTE — ED Triage Notes (Signed)
Pt c/o a bruise that started on the left upper arm 2-3 days ago and is not spread to the medial upper arm with different coloration from purple to green and yellow. Denies injury. States he does take eliquis

## 2018-06-07 NOTE — ED Notes (Signed)
Spoke with doctor robinson, no new orders at this time.

## 2018-07-22 ENCOUNTER — Telehealth: Payer: Self-pay | Admitting: Urology

## 2018-07-22 DIAGNOSIS — N401 Enlarged prostate with lower urinary tract symptoms: Secondary | ICD-10-CM

## 2018-07-22 DIAGNOSIS — R3914 Feeling of incomplete bladder emptying: Principal | ICD-10-CM

## 2018-07-22 NOTE — Telephone Encounter (Signed)
Patient has a new insurance and needs a new prescription sent to [facility] [phone]) for alfuzosin.  Call patient with questions.

## 2018-07-23 MED ORDER — ALFUZOSIN HCL ER 10 MG PO TB24: 10.0000 mg | ORAL_TABLET | Freq: Every day | ORAL | 3 refills | Status: DC

## 2018-07-31 ENCOUNTER — Telehealth: Payer: Self-pay

## 2018-07-31 ENCOUNTER — Ambulatory Visit (INDEPENDENT_AMBULATORY_CARE_PROVIDER_SITE_OTHER): Payer: Medicare Other

## 2018-07-31 DIAGNOSIS — R319 Hematuria, unspecified: Secondary | ICD-10-CM | POA: Diagnosis not present

## 2018-07-31 LAB — URINALYSIS, COMPLETE
Bilirubin, UA: NEGATIVE
GLUCOSE, UA: NEGATIVE
Ketones, UA: NEGATIVE
Leukocytes, UA: NEGATIVE
NITRITE UA: NEGATIVE
PH UA: 6.5 (ref 5.0–7.5)
Specific Gravity, UA: 1.025 (ref 1.005–1.030)
UUROB: 1 mg/dL (ref 0.2–1.0)

## 2018-07-31 LAB — MICROSCOPIC EXAMINATION
EPITHELIAL CELLS (NON RENAL): NONE SEEN /HPF (ref 0–10)
RBC, UA: >30 /hpf — ABNORMAL HIGH (ref 0–2)
WBC UA: NONE SEEN /HPF (ref 0–5)

## 2018-07-31 LAB — BLADDER SCAN AMB NON-IMAGING

## 2018-07-31 NOTE — Telephone Encounter (Signed)
Patient aware of results and advised him we would contact him with culture results.  He should contact his PCP regarding the bowel movement issue.

## 2018-07-31 NOTE — Progress Notes (Signed)
Patient presents to the office today for hematuria.  Patient reports this starting 07/30/2018 around 1145am.  Urine culture ordered.  PVR was 123 about an hour after voiding.  Patients symptoms include: urinary frequency, hard to postpone urination, leakage of urine, blood in urine and urinary urgency.  Patient has not had a bowel movement in 3 days.  Urinalysis showed 3+ blood, negative leu, negative nit, 3+ pro, negative glu, negative ket, SG 1.025, PH 6.5, uro 1.0.

## 2018-07-31 NOTE — Telephone Encounter (Signed)
-----   Message from Nori Riis, PA-C sent at 07/31/2018  2:04 PM EST ----- Please notify Mr. Masterson that he needs to speak with his PCP regarding not having a BM in three days.

## 2018-08-05 ENCOUNTER — Telehealth: Payer: Self-pay

## 2018-08-05 ENCOUNTER — Other Ambulatory Visit: Payer: Self-pay | Admitting: Urology

## 2018-08-05 LAB — CULTURE, URINE COMPREHENSIVE

## 2018-08-05 MED ORDER — AMOXICILLIN 875 MG PO TABS: 875.0000 mg | ORAL_TABLET | Freq: Two times a day (BID) | ORAL | 0 refills | Status: AC

## 2018-08-05 NOTE — Telephone Encounter (Signed)
-----   Message from Abbie Sons, MD sent at 08/05/2018  7:36 AM EST ----- Urine culture was positive for infection.  Antibiotic Rx sent to pharmacy.

## 2018-08-05 NOTE — Telephone Encounter (Signed)
Patient notified

## 2018-08-30 ENCOUNTER — Encounter: Payer: Self-pay | Admitting: Urology

## 2018-08-30 ENCOUNTER — Ambulatory Visit: Payer: Medicare Other | Admitting: Urology

## 2018-08-30 VITALS — BP 143/88 | HR 111 | Ht 71.0 in | Wt 194.5 lb

## 2018-08-30 DIAGNOSIS — R350 Frequency of micturition: Secondary | ICD-10-CM | POA: Diagnosis not present

## 2018-08-30 DIAGNOSIS — N401 Enlarged prostate with lower urinary tract symptoms: Secondary | ICD-10-CM | POA: Diagnosis not present

## 2018-08-30 DIAGNOSIS — R319 Hematuria, unspecified: Secondary | ICD-10-CM | POA: Diagnosis not present

## 2018-08-30 DIAGNOSIS — R3914 Feeling of incomplete bladder emptying: Secondary | ICD-10-CM

## 2018-08-30 LAB — URINALYSIS, COMPLETE
BILIRUBIN UA: NEGATIVE
Glucose, UA: NEGATIVE
Ketones, UA: NEGATIVE
Nitrite, UA: NEGATIVE
Specific Gravity, UA: 1.025 (ref 1.005–1.030)
Urobilinogen, Ur: 0.2 mg/dL (ref 0.2–1.0)
pH, UA: 6 (ref 5.0–7.5)

## 2018-08-30 LAB — MICROSCOPIC EXAMINATION

## 2018-08-30 NOTE — Progress Notes (Signed)
08/30/2018 12:44 PM   [name] June 21, 1928 [id number]  Referring provider: Ezequiel Kayser, MD [place] [facility] [place], [place] [address]  Chief Complaint  Patient presents with  . Follow-up    HPI: 85 year old male presents for follow-up of a recent UTI.  I last saw him July 2019 for BPH with incomplete bladder emptying.  He remains on finasteride and alfuzosin.  He has no bothersome lower urinary tract symptoms at this time.  On 07/31/2018 he was seen as a nurse visit for worsening frequency, urgency with urge incontinence and hematuria.  Urinalysis showed RBCs and urine culture did grow enterococcus.  He was treated with antibiotics with resolution of his symptoms.  His PVR was 123 mL.  He presently has no complaints today.   PMH: Past Medical History:  Diagnosis Date  . BPH (benign prostatic hyperplasia)   . ED (erectile dysfunction)   . Gilbert disease   . HLD (hyperlipidemia)   . Hypertension   . Macular degeneration   . Osteopenia   . Sensorineural hearing loss     Surgical History: Past Surgical History:  Procedure Laterality Date  . CATARACT EXTRACTION    . HEMORRHOIDECTOMY WITH HEMORRHOID BANDING    . KNEE ARTHROSCOPY    . PACEMAKER INSERTION N/A 03/03/2015   Procedure: INSERTION DUAL LEAD PACEMAKER;  Surgeon: Isaias Cowman, MD;  Location: [facility] ORS;  Service: Cardiovascular;  Laterality: N/A;  . TONSILLECTOMY      Home Medications:  Allergies as of 08/30/2018   No Known Allergies     Medication List       Accurate as of August 30, 2018 11:59 PM. Always use your most recent med list.        alfuzosin 10 MG 24 hr tablet Commonly known as:  UROXATRAL Take 1 tablet (10 mg total) by mouth daily with breakfast.   aspirin 325 MG tablet Take 1 tablet by mouth daily.   atorvastatin 10 MG tablet Commonly known as:  LIPITOR Take 1 tablet by mouth daily.   ELIQUIS 5 MG Tabs tablet Generic drug:  apixaban Take 5 mg  by mouth 2 (two) times daily.   finasteride 5 MG tablet Commonly known as:  PROSCAR Take 1 tablet by mouth daily.   Fish Oil 1000 MG Caps Take 1 tablet by mouth 2 (two) times daily.   hydrochlorothiazide 25 MG tablet Commonly known as:  HYDRODIURIL Take 1 tablet (25 mg total) by mouth daily.   lisinopril 20 MG tablet Commonly known as:  PRINIVIL,ZESTRIL Take 0.5 tablets (10 mg total) by mouth daily.   metoprolol succinate 25 MG 24 hr tablet Commonly known as:  TOPROL-XL Take by mouth.   metoprolol tartrate 25 MG tablet Commonly known as:  LOPRESSOR Take 75 mg by mouth daily.   MULTI-VITAMINS Tabs Take 1 tablet by mouth daily.   OCUVITE PRESERVISION PO Take 1 tablet by mouth 2 (two) times daily.   Vitamin D3 25 MCG (1000 UT) Caps Take 1 capsule by mouth daily.   ZOSTAVAX 19147 UNT/0.65ML injection Generic drug:  zoster vaccine live (PF) Inject 1 vial into the muscle once.       Allergies: No Known Allergies  Family History: Family History  Problem Relation Age of Onset  . Diabetes Mother   . Lung cancer Father   . Cancer Paternal Uncle   . Prostate cancer Neg Hx   . Bladder Cancer Neg Hx   . Kidney cancer Neg Hx  Social History:  reports that he has never smoked. He has never used smokeless tobacco. He reports current alcohol use. He reports that he does not use drugs.  ROS: UROLOGY Frequent Urination?: Yes Hard to postpone urination?: Yes Burning/pain with urination?: No Get up at night to urinate?: No Leakage of urine?: No Urine stream starts and stops?: No Trouble starting stream?: No Do you have to strain to urinate?: No Blood in urine?: No Urinary tract infection?: No Sexually transmitted disease?: No Injury to kidneys or bladder?: No Painful intercourse?: No Weak stream?: Yes Erection problems?: No Penile pain?: No  Gastrointestinal Nausea?: No Vomiting?: No Indigestion/heartburn?: No Diarrhea?: No Constipation?:  No  Constitutional Fever: No Night sweats?: No Weight loss?: No Fatigue?: No  Skin Skin rash/lesions?: No Itching?: No  Eyes Blurred vision?: No Double vision?: No  Ears/Nose/Throat Sore throat?: No Sinus problems?: No  Hematologic/Lymphatic Swollen glands?: No Easy bruising?: No  Cardiovascular Leg swelling?: No Chest pain?: No  Respiratory Cough?: No Shortness of breath?: No  Endocrine Excessive thirst?: No  Musculoskeletal Back pain?: Yes Joint pain?: Yes  Neurological Headaches?: No Dizziness?: No  Psychologic Depression?: No Anxiety?: No  Physical Exam: BP (!) 143/88 (BP Location: Left Arm, Patient Position: Sitting, Cuff Size: Large)   Pulse (!) 111   Ht 5\' 11"  (1.803 m)   Wt 194 lb 8 oz (88.2 kg)   BMI 27.13 kg/m   Constitutional:  Alert and oriented, No acute distress. HEENT: [place] AT, moist mucus membranes.  Trachea midline, no masses. Cardiovascular: No clubbing, cyanosis, or edema. Respiratory: Normal respiratory effort, no increased work of breathing. Skin: No rashes, bruises or suspicious lesions. Neurologic: Grossly intact, no focal deficits, moving all 4 extremities. Psychiatric: Normal mood and affect.  Laboratory Data:  Urinalysis Dipstick trace blood/trace leukocytes Microscopy 3-10 RBC   Assessment & Plan:   85 year old male with BPH and incomplete bladder emptying on finasteride and alfuzosin.  His mail order pharmacy has changed and he needed refills of these medications sent to [facility].  Urinalysis today does show microhematuria.  Will repeat in 1 month and if blood still present would recommend upper tract imaging and cystoscopy.  Will otherwise follow-up in 6 months for symptom recheck and bladder scan for PVR.   Abbie Sons, [place] [address], [place], [place] [address] [phone]

## 2018-09-01 ENCOUNTER — Encounter: Payer: Self-pay | Admitting: Urology

## 2018-09-01 MED ORDER — FINASTERIDE 5 MG PO TABS: 5.0000 mg | ORAL_TABLET | Freq: Every day | ORAL | 3 refills | Status: DC

## 2018-09-01 MED ORDER — ALFUZOSIN HCL ER 10 MG PO TB24: 10.0000 mg | ORAL_TABLET | Freq: Every day | ORAL | 3 refills | Status: DC

## 2018-09-02 ENCOUNTER — Telehealth: Payer: Self-pay | Admitting: Family Medicine

## 2018-09-02 NOTE — Telephone Encounter (Signed)
Patient notified and appointment scheduled. 

## 2018-09-02 NOTE — Telephone Encounter (Signed)
-----   Message from Abbie Sons, MD sent at 08/31/2018  9:56 PM EST ----- UA did show microhematuria.  Recc repeat ua 1 month

## 2018-09-30 ENCOUNTER — Other Ambulatory Visit: Payer: Medicare Other

## 2018-09-30 ENCOUNTER — Other Ambulatory Visit: Payer: Self-pay

## 2018-09-30 DIAGNOSIS — R3129 Other microscopic hematuria: Secondary | ICD-10-CM

## 2018-09-30 LAB — URINALYSIS, COMPLETE
BILIRUBIN UA: NEGATIVE
Glucose, UA: NEGATIVE
Ketones, UA: NEGATIVE
Leukocytes, UA: NEGATIVE
Nitrite, UA: NEGATIVE
PH UA: 5.5 (ref 5.0–7.5)
Protein, UA: NEGATIVE
Specific Gravity, UA: 1.025 (ref 1.005–1.030)
UUROB: 0.2 mg/dL (ref 0.2–1.0)

## 2018-09-30 LAB — MICROSCOPIC EXAMINATION
BACTERIA UA: NONE SEEN
WBC UA: NONE SEEN /HPF (ref 0–5)

## 2018-10-01 ENCOUNTER — Telehealth: Payer: Self-pay | Admitting: *Deleted

## 2018-10-01 NOTE — Telephone Encounter (Addendum)
Left a VM to return call     ----- Message from Abbie Sons, MD sent at 10/01/2018  1:22 PM EDT ----- Urinalysis showed no RBCs.  Recommend keeping August follow-up

## 2018-10-01 NOTE — Telephone Encounter (Signed)
Pt called you back and states that you can call him back.

## 2018-10-02 ENCOUNTER — Telehealth: Payer: Self-pay | Admitting: Family Medicine

## 2018-10-02 NOTE — Telephone Encounter (Signed)
Patient notified, voiced understanding.

## 2018-10-02 NOTE — Telephone Encounter (Signed)
-----   Message from Abbie Sons, MD sent at 10/01/2018  1:22 PM EDT ----- Urinalysis showed no RBCs.  Recommend keeping August follow-up

## 2018-10-02 NOTE — Telephone Encounter (Signed)
Informed patient as instructed, verbalized understanding.  

## 2018-12-04 DIAGNOSIS — M5136 Other intervertebral disc degeneration, lumbar region: Secondary | ICD-10-CM | POA: Insufficient documentation

## 2019-01-14 ENCOUNTER — Other Ambulatory Visit: Payer: Self-pay | Admitting: Neurology

## 2019-01-14 DIAGNOSIS — R2689 Other abnormalities of gait and mobility: Secondary | ICD-10-CM

## 2019-01-14 DIAGNOSIS — H9193 Unspecified hearing loss, bilateral: Secondary | ICD-10-CM | POA: Diagnosis not present

## 2019-01-14 DIAGNOSIS — I4819 Other persistent atrial fibrillation: Secondary | ICD-10-CM | POA: Diagnosis not present

## 2019-01-14 DIAGNOSIS — E531 Pyridoxine deficiency: Secondary | ICD-10-CM | POA: Diagnosis not present

## 2019-01-14 DIAGNOSIS — E559 Vitamin D deficiency, unspecified: Secondary | ICD-10-CM | POA: Diagnosis not present

## 2019-01-14 DIAGNOSIS — R29898 Other symptoms and signs involving the musculoskeletal system: Secondary | ICD-10-CM | POA: Diagnosis not present

## 2019-01-14 DIAGNOSIS — E538 Deficiency of other specified B group vitamins: Secondary | ICD-10-CM | POA: Diagnosis not present

## 2019-01-16 ENCOUNTER — Telehealth: Payer: Self-pay

## 2019-01-16 DIAGNOSIS — N401 Enlarged prostate with lower urinary tract symptoms: Secondary | ICD-10-CM

## 2019-01-16 DIAGNOSIS — R3914 Feeling of incomplete bladder emptying: Secondary | ICD-10-CM

## 2019-01-16 MED ORDER — FINASTERIDE 5 MG PO TABS: 5.0000 mg | ORAL_TABLET | Freq: Every day | ORAL | 3 refills | Status: DC

## 2019-01-16 MED ORDER — ALFUZOSIN HCL ER 10 MG PO TB24: 10.0000 mg | ORAL_TABLET | Freq: Every day | ORAL | 3 refills | Status: DC

## 2019-01-16 NOTE — Addendum Note (Signed)
Addended by: Feliberto Gottron on: 01/16/2019 12:35 PM   Modules accepted: Orders

## 2019-01-16 NOTE — Telephone Encounter (Signed)
Refill for Alfuzosin sent to [facility]

## 2019-01-16 NOTE — Telephone Encounter (Signed)
Refill for Finasteride sent to [facility]

## 2019-01-24 ENCOUNTER — Other Ambulatory Visit (HOSPITAL_COMMUNITY): Payer: Self-pay | Admitting: Neurology

## 2019-01-24 DIAGNOSIS — R2689 Other abnormalities of gait and mobility: Secondary | ICD-10-CM

## 2019-01-24 DIAGNOSIS — R29898 Other symptoms and signs involving the musculoskeletal system: Secondary | ICD-10-CM

## 2019-01-27 ENCOUNTER — Ambulatory Visit: Payer: Medicare Other | Admitting: Urology

## 2019-01-28 ENCOUNTER — Ambulatory Visit: Admission: RE | Admit: 2019-01-28 | Payer: Medicare HMO | Source: Ambulatory Visit

## 2019-01-29 DIAGNOSIS — R2689 Other abnormalities of gait and mobility: Secondary | ICD-10-CM | POA: Insufficient documentation

## 2019-02-07 ENCOUNTER — Other Ambulatory Visit: Payer: Self-pay

## 2019-02-07 ENCOUNTER — Ambulatory Visit (HOSPITAL_COMMUNITY)
Admission: RE | Admit: 2019-02-07 | Discharge: 2019-02-07 | Disposition: A | Discharge status: Home / Self Care | Payer: Medicare HMO | Source: Ambulatory Visit | Attending: Neurology | Admitting: Neurology

## 2019-02-07 DIAGNOSIS — R42 Dizziness and giddiness: Secondary | ICD-10-CM | POA: Diagnosis not present

## 2019-02-07 DIAGNOSIS — R29898 Other symptoms and signs involving the musculoskeletal system: Secondary | ICD-10-CM | POA: Diagnosis not present

## 2019-02-07 DIAGNOSIS — R2689 Other abnormalities of gait and mobility: Secondary | ICD-10-CM

## 2019-02-07 IMAGING — MR MRI HEAD WITHOUT CONTRAST
12 of 14 series · 36 of 48 positions shown · non-contrast
Comparison: None.

CLINICAL DATA: Dizziness imbalance.  Bilateral leg weakness

EXAM:
MRI HEAD WITHOUT CONTRAST
TECHNIQUE: Multiplanar, multiecho pulse sequences of the brain and surrounding
structures were obtained without intravenous contrast.

[Series 5: DWI · axial · 3.0mm · 0.88mm/px · z∈[-32,+119]mm · 6 of 104 slices shown (1 of 4)]
[im 1/104]
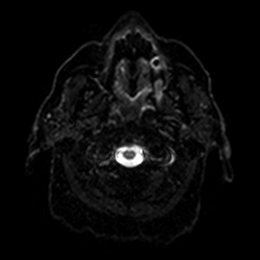
[im 21/104]
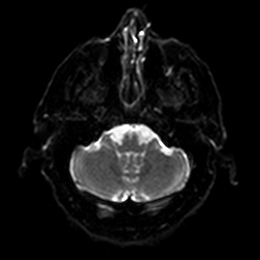
[im 42/104]
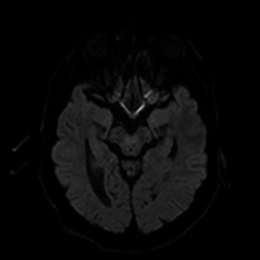
[im 62/104]
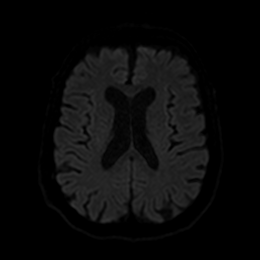
[im 83/104]
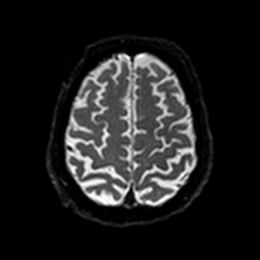
[im 104/104]
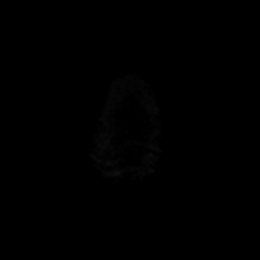

[Series 6: DWI · axial · 3.0mm · 0.88mm/px · z∈[-32,+119]mm · 3 of 52 slices shown (2 of 4)]
[im 1/52]
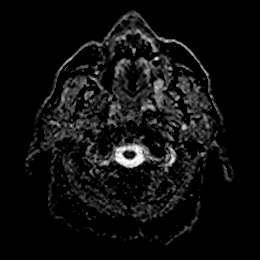
[im 26/52]
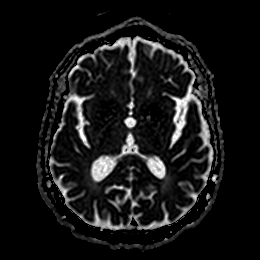
[im 52/52]
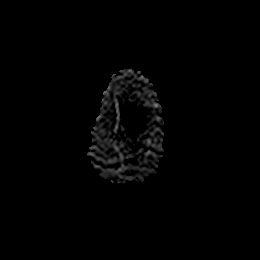

[Series 7: DWI · coronal · 4.0mm · 0.88mm/px · 4 of 70 slices shown (3 of 4)]
[im 1/70]
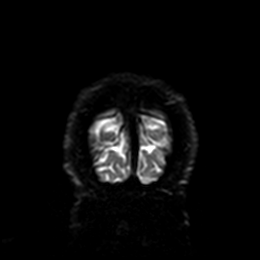
[im 24/70]
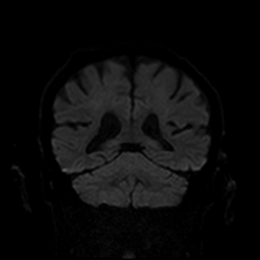
[im 47/70]
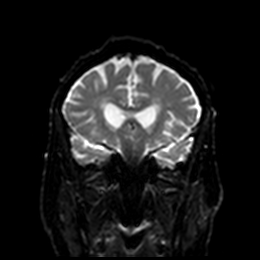
[im 70/70]
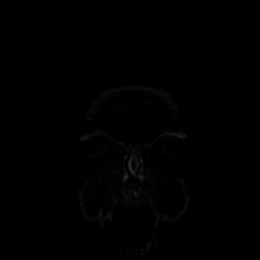

[Series 8: DWI · coronal · 4.0mm · 0.88mm/px · 2 of 35 slices shown (4 of 4)]
[im 1/35]
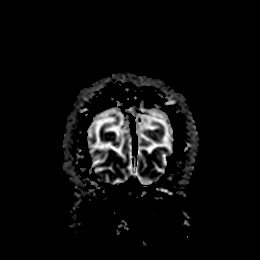
[im 35/35]
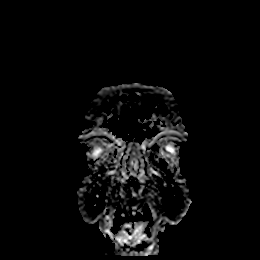

[Series 9: T1 · sagittal · 5.0mm · 0.75mm/px · 1 of 23 slices shown]
[im 1/23]
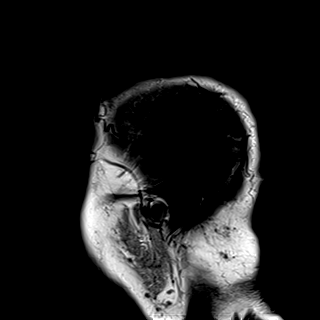

[Series 10: T2 · axial · 5.0mm · 0.72mm/px · z∈[-38,+117]mm · 2 of 27 slices shown (1 of 2)]
[im 1/27]
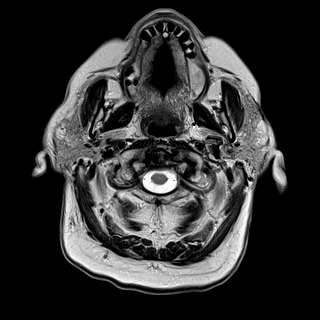
[im 27/27]
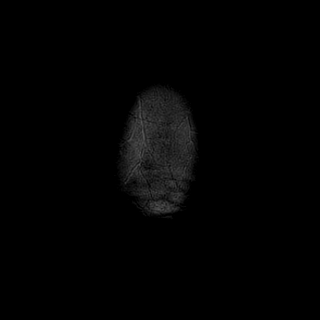

[Series 11: FLAIR · axial · 5.0mm · 0.45mm/px · z∈[-36,+118]mm · 2 of 27 slices shown]
[im 1/27]
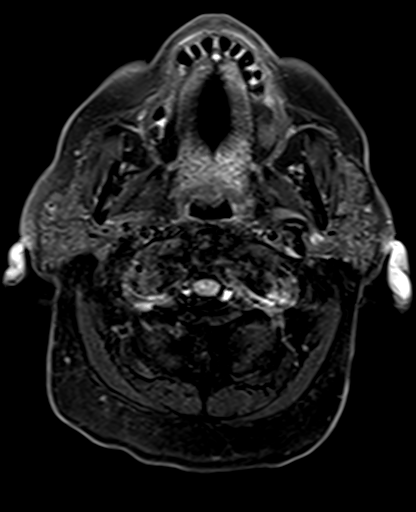
[im 27/27]
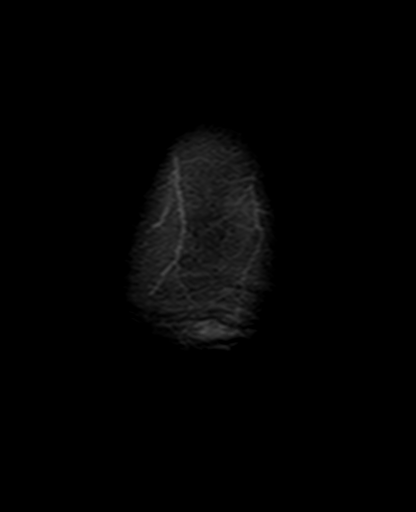

[Series 12: mag_images · axial · 3.0mm · 0.90mm/px · z∈[-40,+135]mm · 4 of 60 slices shown]
[im 1/60]
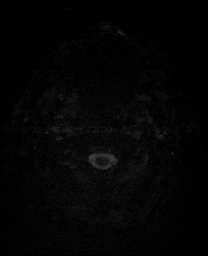
[im 20/60]
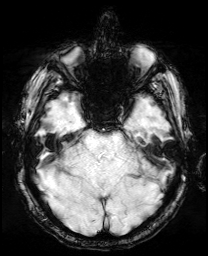
[im 40/60]
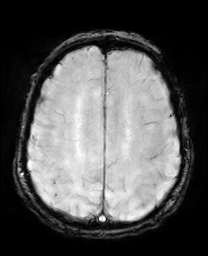
[im 60/60]
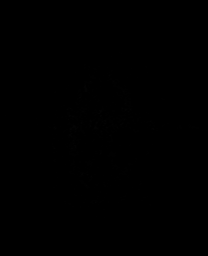

[Series 13: pha_images · axial · 3.0mm · 0.90mm/px · z∈[-40,+132]mm · 3 of 57 slices shown]
[im 1/57]
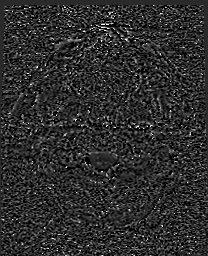
[im 29/57]
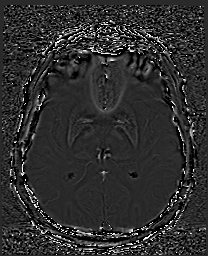
[im 57/57]
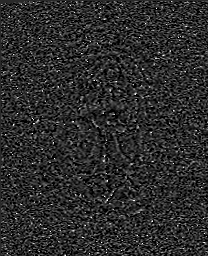

[Series 14: swi_images · axial · 3.0mm · 0.90mm/px · z∈[-40,+135]mm · 4 of 60 slices shown]
[im 1/60]
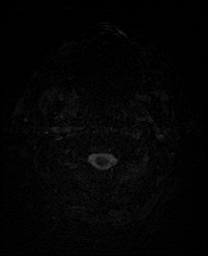
[im 20/60]
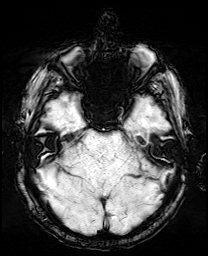
[im 40/60]
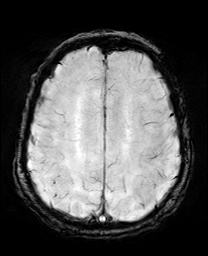
[im 60/60]
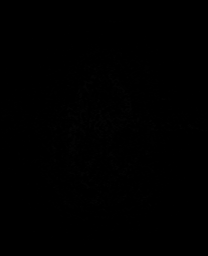

[Series 15: mip_images(sw) · axial · 24.0mm · 0.90mm/px · z∈[-29,+124]mm · 3 of 53 slices shown]
[im 1/53]
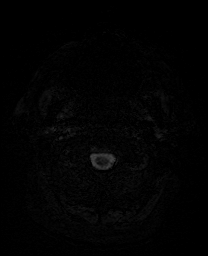
[im 27/53]
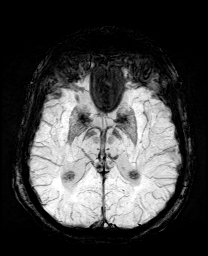
[im 53/53]
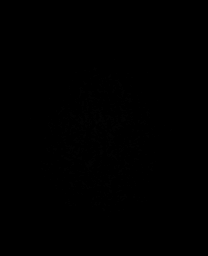

[Series 17: T2 · coronal · 5.0mm · 0.34mm/px · 2 of 30 slices shown (2 of 2)]
[im 1/30]
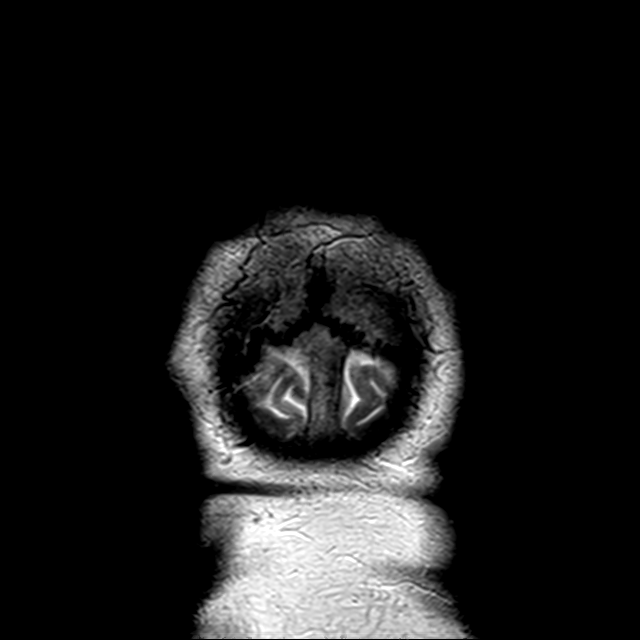
[im 30/30]
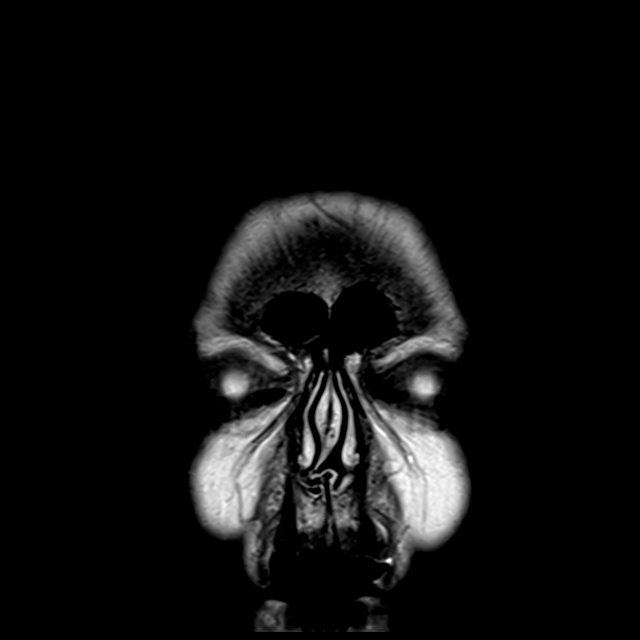

[36 of 48 positions shown; findings below may reference images not displayed]

FINDINGS: Brain: Ventricle size and cerebral volume normal for age. Negative
for acute infarct. Mild chronic white matter changes predominantly
in the periventricular white matter. Brainstem and cerebellum
normal. Negative for hemorrhage or mass. No fluid collection or
midline shift.

Vascular: Normal arterial flow voids.

Skull and upper cervical spine: Right frontal scalp lesion. This may
be postsurgical, correlate with physical exam. Adjacent bone appears
normal.

Sinuses/Orbits: Mild mucosal paranasal sinuses. Bilateral cataract
surgery

Other: None
IMPRESSION: No acute abnormality.  Mild chronic white matter changes.

Abnormality in the right frontal scalp, question surgical defect.

## 2019-02-19 DIAGNOSIS — R0602 Shortness of breath: Secondary | ICD-10-CM | POA: Diagnosis not present

## 2019-02-19 DIAGNOSIS — I4819 Other persistent atrial fibrillation: Secondary | ICD-10-CM | POA: Diagnosis not present

## 2019-02-19 DIAGNOSIS — R079 Chest pain, unspecified: Secondary | ICD-10-CM | POA: Diagnosis not present

## 2019-02-19 DIAGNOSIS — E7800 Pure hypercholesterolemia, unspecified: Secondary | ICD-10-CM | POA: Diagnosis not present

## 2019-02-19 DIAGNOSIS — I10 Essential (primary) hypertension: Secondary | ICD-10-CM | POA: Diagnosis not present

## 2019-02-19 DIAGNOSIS — R001 Bradycardia, unspecified: Secondary | ICD-10-CM | POA: Diagnosis not present

## 2019-02-27 DIAGNOSIS — H02132 Senile ectropion of right lower eyelid: Secondary | ICD-10-CM | POA: Diagnosis not present

## 2019-02-27 DIAGNOSIS — H02135 Senile ectropion of left lower eyelid: Secondary | ICD-10-CM | POA: Diagnosis not present

## 2019-02-27 DIAGNOSIS — H04553 Acquired stenosis of bilateral nasolacrimal duct: Secondary | ICD-10-CM | POA: Diagnosis not present

## 2019-02-27 DIAGNOSIS — H04552 Acquired stenosis of left nasolacrimal duct: Secondary | ICD-10-CM | POA: Diagnosis not present

## 2019-02-27 DIAGNOSIS — H04551 Acquired stenosis of right nasolacrimal duct: Secondary | ICD-10-CM | POA: Diagnosis not present

## 2019-03-03 DIAGNOSIS — R0602 Shortness of breath: Secondary | ICD-10-CM | POA: Diagnosis not present

## 2019-03-03 DIAGNOSIS — I4819 Other persistent atrial fibrillation: Secondary | ICD-10-CM | POA: Diagnosis not present

## 2019-03-03 DIAGNOSIS — R079 Chest pain, unspecified: Secondary | ICD-10-CM | POA: Diagnosis not present

## 2019-03-06 ENCOUNTER — Encounter: Payer: Self-pay | Admitting: Urology

## 2019-03-06 ENCOUNTER — Ambulatory Visit: Payer: Medicare Other | Admitting: Urology

## 2019-03-21 DIAGNOSIS — E7800 Pure hypercholesterolemia, unspecified: Secondary | ICD-10-CM | POA: Diagnosis not present

## 2019-03-21 DIAGNOSIS — R001 Bradycardia, unspecified: Secondary | ICD-10-CM | POA: Diagnosis not present

## 2019-03-21 DIAGNOSIS — I10 Essential (primary) hypertension: Secondary | ICD-10-CM | POA: Diagnosis not present

## 2019-03-21 DIAGNOSIS — I4819 Other persistent atrial fibrillation: Secondary | ICD-10-CM | POA: Diagnosis not present

## 2019-04-18 ENCOUNTER — Ambulatory Visit: Payer: Medicare HMO | Admitting: Urology

## 2019-04-29 DIAGNOSIS — Z20828 Contact with and (suspected) exposure to other viral communicable diseases: Secondary | ICD-10-CM | POA: Diagnosis not present

## 2019-04-29 DIAGNOSIS — H04551 Acquired stenosis of right nasolacrimal duct: Secondary | ICD-10-CM | POA: Diagnosis not present

## 2019-04-29 DIAGNOSIS — H02135 Senile ectropion of left lower eyelid: Secondary | ICD-10-CM | POA: Diagnosis not present

## 2019-04-29 DIAGNOSIS — H02132 Senile ectropion of right lower eyelid: Secondary | ICD-10-CM | POA: Diagnosis not present

## 2019-05-02 DIAGNOSIS — H02122 Mechanical ectropion of right lower eyelid: Secondary | ICD-10-CM | POA: Diagnosis not present

## 2019-05-02 DIAGNOSIS — E7800 Pure hypercholesterolemia, unspecified: Secondary | ICD-10-CM | POA: Diagnosis not present

## 2019-05-02 DIAGNOSIS — H02135 Senile ectropion of left lower eyelid: Secondary | ICD-10-CM | POA: Diagnosis not present

## 2019-05-02 DIAGNOSIS — Z79899 Other long term (current) drug therapy: Secondary | ICD-10-CM | POA: Diagnosis not present

## 2019-05-02 DIAGNOSIS — I10 Essential (primary) hypertension: Secondary | ICD-10-CM | POA: Diagnosis not present

## 2019-05-02 DIAGNOSIS — I4819 Other persistent atrial fibrillation: Secondary | ICD-10-CM | POA: Diagnosis not present

## 2019-05-02 DIAGNOSIS — H02132 Senile ectropion of right lower eyelid: Secondary | ICD-10-CM | POA: Diagnosis not present

## 2019-05-02 DIAGNOSIS — H02125 Mechanical ectropion of left lower eyelid: Secondary | ICD-10-CM | POA: Diagnosis not present

## 2019-05-02 DIAGNOSIS — H04551 Acquired stenosis of right nasolacrimal duct: Secondary | ICD-10-CM | POA: Diagnosis not present

## 2019-05-02 DIAGNOSIS — Z7982 Long term (current) use of aspirin: Secondary | ICD-10-CM | POA: Diagnosis not present

## 2019-05-02 DIAGNOSIS — E119 Type 2 diabetes mellitus without complications: Secondary | ICD-10-CM | POA: Diagnosis not present

## 2019-05-02 DIAGNOSIS — Z7901 Long term (current) use of anticoagulants: Secondary | ICD-10-CM | POA: Diagnosis not present

## 2019-05-08 ENCOUNTER — Ambulatory Visit (INDEPENDENT_AMBULATORY_CARE_PROVIDER_SITE_OTHER): Payer: Medicare HMO | Admitting: Urology

## 2019-05-08 ENCOUNTER — Encounter: Payer: Self-pay | Admitting: Urology

## 2019-05-08 ENCOUNTER — Other Ambulatory Visit: Payer: Self-pay

## 2019-05-08 VITALS — BP 128/69 | HR 76 | Ht 71.0 in | Wt 196.2 lb

## 2019-05-08 DIAGNOSIS — R3129 Other microscopic hematuria: Secondary | ICD-10-CM

## 2019-05-08 DIAGNOSIS — Z87448 Personal history of other diseases of urinary system: Secondary | ICD-10-CM | POA: Diagnosis not present

## 2019-05-08 DIAGNOSIS — R350 Frequency of micturition: Secondary | ICD-10-CM | POA: Diagnosis not present

## 2019-05-08 DIAGNOSIS — N401 Enlarged prostate with lower urinary tract symptoms: Secondary | ICD-10-CM | POA: Diagnosis not present

## 2019-05-08 LAB — URINALYSIS, COMPLETE
Bilirubin, UA: NEGATIVE
Glucose, UA: NEGATIVE
Ketones, UA: NEGATIVE
Leukocytes,UA: NEGATIVE
Nitrite, UA: NEGATIVE
Specific Gravity, UA: 1.025 (ref 1.005–1.030)
Urobilinogen, Ur: 1 mg/dL (ref 0.2–1.0)
pH, UA: 7 (ref 5.0–7.5)

## 2019-05-08 LAB — MICROSCOPIC EXAMINATION: Bacteria, UA: NONE SEEN

## 2019-05-08 NOTE — Progress Notes (Signed)
05/08/2019 10:33 AM   [name] 07/23/1927 [id number]  Referring provider: Ezequiel Kayser, MD [facility] [place],  [place] [address]  Chief Complaint  Patient presents with  . Benign Prostatic Hypertrophy    Urologic history: 1.  BPH with lower urinary tract symptoms -Combination therapy finasteride/alfuzosin  HPI: 85 y.o. male presents for semiannual follow-up.  Overall he states he is doing well.  He has stable lower urinary tract symptoms.  He notes intermittent frequency, decreased stream and urinary urgency.  He was treated for a UTI January 2020 and denies recurrent infection.     PMH: Past Medical History:  Diagnosis Date  . BPH (benign prostatic hyperplasia)   . ED (erectile dysfunction)   . Gilbert disease   . HLD (hyperlipidemia)   . Hypertension   . Macular degeneration   . Osteopenia   . Sensorineural hearing loss     Surgical History: Past Surgical History:  Procedure Laterality Date  . CATARACT EXTRACTION    . HEMORRHOIDECTOMY WITH HEMORRHOID BANDING    . KNEE ARTHROSCOPY    . PACEMAKER INSERTION N/A 03/03/2015   Procedure: INSERTION DUAL LEAD PACEMAKER;  Surgeon: Isaias Cowman, MD;  Location: [facility] ORS;  Service: Cardiovascular;  Laterality: N/A;  . TONSILLECTOMY      Home Medications:  Allergies as of 05/08/2019      Reactions   Atenolol    Other reaction(s): Unknown      Medication List       Accurate as of May 08, 2019 10:33 AM. If you have any questions, ask your nurse or doctor.        STOP taking these medications   Zostavax 19400 UNT/0.65ML injection Generic drug: zoster vaccine live (PF) Stopped by: Abbie Sons, MD     TAKE these medications   acetaminophen 500 MG tablet Commonly known as: TYLENOL Take by mouth.   alfuzosin 10 MG 24 hr tablet Commonly known as: UROXATRAL Take 1 tablet (10 mg total) by mouth daily with breakfast.   apixaban 5 MG Tabs tablet Commonly known  as: ELIQUIS Take by mouth. What changed: Another medication with the same name was removed. Continue taking this medication, and follow the directions you see here. Changed by: Abbie Sons, MD   aspirin EC 81 MG tablet Take by mouth. What changed: Another medication with the same name was removed. Continue taking this medication, and follow the directions you see here. Changed by: Abbie Sons, MD   atorvastatin 10 MG tablet Commonly known as: LIPITOR Take 1 tablet by mouth daily.   erythromycin ophthalmic ointment Place along incisions twice daily   finasteride 5 MG tablet Commonly known as: PROSCAR Take 1 tablet (5 mg total) by mouth daily.   Fish Oil 1000 MG Caps Take 1 tablet by mouth 2 (two) times daily.   fluticasone 50 MCG/ACT nasal spray Commonly known as: FLONASE Place into the nose.   hydrochlorothiazide 25 MG tablet Commonly known as: HYDRODIURIL Take 1 tablet (25 mg total) by mouth daily.   lisinopril 20 MG tablet Commonly known as: ZESTRIL Take 0.5 tablets (10 mg total) by mouth daily.   metoprolol succinate 25 MG 24 hr tablet Commonly known as: TOPROL-XL Take by mouth. What changed: Another medication with the same name was removed. Continue taking this medication, and follow the directions you see here. Changed by: Abbie Sons, MD   metoprolol succinate 50 MG 24 hr tablet Commonly known as: TOPROL-XL What  changed: Another medication with the same name was removed. Continue taking this medication, and follow the directions you see here. Changed by: Abbie Sons, MD   metoprolol tartrate 25 MG tablet Commonly known as: LOPRESSOR Take 75 mg by mouth daily.   Multi-Vitamins Tabs Take 1 tablet by mouth daily.   OCUVITE PRESERVISION PO Take 1 tablet by mouth 2 (two) times daily.   ondansetron 4 MG tablet Commonly known as: ZOFRAN Take by mouth.   tobramycin-dexamethasone ophthalmic solution Commonly known as: TOBRADEX Apply to eye.    Vitamin D3 50 MCG (2000 UT) capsule Take 1 capsule by mouth daily.       Allergies:  Allergies  Allergen Reactions  . Atenolol     Other reaction(s): Unknown    Family History: Family History  Problem Relation Age of Onset  . Diabetes Mother   . Lung cancer Father   . Cancer Paternal Uncle   . Prostate cancer Neg Hx   . Bladder Cancer Neg Hx   . Kidney cancer Neg Hx     Social History:  reports that he has never smoked. He has never used smokeless tobacco. He reports current alcohol use. He reports that he does not use drugs.  ROS: UROLOGY Frequent Urination?: Yes Hard to postpone urination?: Yes Burning/pain with urination?: No Get up at night to urinate?: Yes Leakage of urine?: No Urine stream starts and stops?: No Trouble starting stream?: No Do you have to strain to urinate?: No Blood in urine?: No Urinary tract infection?: No Sexually transmitted disease?: No Injury to kidneys or bladder?: No Painful intercourse?: No Weak stream?: Yes Erection problems?: No Penile pain?: No  Gastrointestinal Nausea?: No Vomiting?: No Indigestion/heartburn?: No Diarrhea?: No Constipation?: No  Constitutional Fever: No Night sweats?: No Weight loss?: No Fatigue?: No  Skin Skin rash/lesions?: No Itching?: No  Eyes Blurred vision?: No Double vision?: No  Ears/Nose/Throat Sore throat?: No Sinus problems?: No  Hematologic/Lymphatic Swollen glands?: No Easy bruising?: Yes  Cardiovascular Leg swelling?: Yes Chest pain?: No  Respiratory Cough?: No Shortness of breath?: No  Endocrine Excessive thirst?: No  Musculoskeletal Back pain?: Yes Joint pain?: No  Neurological Headaches?: No Dizziness?: No  Psychologic Depression?: No Anxiety?: No  Physical Exam: BP 128/69 (BP Location: Left Arm, Patient Position: Sitting, Cuff Size: Normal)   Pulse 76   Ht 5\' 11"  (1.803 m)   Wt 196 lb 3.2 oz (89 kg)   BMI 27.36 kg/m   Constitutional:  Alert  and oriented, No acute distress. HEENT: [place] AT, moist mucus membranes.  Trachea midline, no masses. Cardiovascular: No clubbing, cyanosis, or edema. Respiratory: Normal respiratory effort, no increased work of breathing. GI: Abdomen is soft, nontender, nondistended, no abdominal masses GU: No CVA tenderness Lymph: No cervical or inguinal lymphadenopathy. Skin: No rashes, bruises or suspicious lesions. Neurologic: Grossly intact, no focal deficits, moving all 4 extremities. Psychiatric: Normal mood and affect.  Laboratory Data:  Urinalysis Dipstick trace intact blood Microscopy 3-10 RBC   Assessment & Plan:    - BPH with lower urinary tract symptoms Stable voiding symptoms on finasteride/alfuzosin.  He did not need refills.  - Microhematuria Urinalysis today with 3-10 RBCs.  We discussed current recommendations for microhematuria.  Based on age he does fall in the high risk ratification.  We discussed the recommendation of CT urogram/cystoscopy and he desires to pursue further evaluation.   Abbie Sons, [facility] [address], [place], [place] [address] [phone]  [phone]  

## 2019-05-10 ENCOUNTER — Encounter: Payer: Self-pay | Admitting: Urology

## 2019-05-13 DIAGNOSIS — H04552 Acquired stenosis of left nasolacrimal duct: Secondary | ICD-10-CM | POA: Diagnosis not present

## 2019-05-13 DIAGNOSIS — Z20828 Contact with and (suspected) exposure to other viral communicable diseases: Secondary | ICD-10-CM | POA: Diagnosis not present

## 2019-05-16 DIAGNOSIS — H04552 Acquired stenosis of left nasolacrimal duct: Secondary | ICD-10-CM | POA: Diagnosis not present

## 2019-05-16 DIAGNOSIS — I10 Essential (primary) hypertension: Secondary | ICD-10-CM | POA: Diagnosis not present

## 2019-05-16 DIAGNOSIS — Z9889 Other specified postprocedural states: Secondary | ICD-10-CM | POA: Diagnosis not present

## 2019-05-16 DIAGNOSIS — H04202 Unspecified epiphora, left lacrimal gland: Secondary | ICD-10-CM | POA: Diagnosis not present

## 2019-05-19 DIAGNOSIS — H905 Unspecified sensorineural hearing loss: Secondary | ICD-10-CM | POA: Diagnosis not present

## 2019-05-19 DIAGNOSIS — M1712 Unilateral primary osteoarthritis, left knee: Secondary | ICD-10-CM | POA: Diagnosis not present

## 2019-05-19 DIAGNOSIS — R7302 Impaired glucose tolerance (oral): Secondary | ICD-10-CM | POA: Diagnosis not present

## 2019-05-19 DIAGNOSIS — I4819 Other persistent atrial fibrillation: Secondary | ICD-10-CM | POA: Diagnosis not present

## 2019-05-19 DIAGNOSIS — E7800 Pure hypercholesterolemia, unspecified: Secondary | ICD-10-CM | POA: Diagnosis not present

## 2019-05-19 DIAGNOSIS — R809 Proteinuria, unspecified: Secondary | ICD-10-CM | POA: Diagnosis not present

## 2019-05-19 DIAGNOSIS — N401 Enlarged prostate with lower urinary tract symptoms: Secondary | ICD-10-CM | POA: Diagnosis not present

## 2019-05-19 DIAGNOSIS — Z79899 Other long term (current) drug therapy: Secondary | ICD-10-CM | POA: Diagnosis not present

## 2019-05-19 DIAGNOSIS — I10 Essential (primary) hypertension: Secondary | ICD-10-CM | POA: Diagnosis not present

## 2019-05-29 DIAGNOSIS — H04552 Acquired stenosis of left nasolacrimal duct: Secondary | ICD-10-CM | POA: Diagnosis not present

## 2019-05-29 DIAGNOSIS — H02132 Senile ectropion of right lower eyelid: Secondary | ICD-10-CM | POA: Diagnosis not present

## 2019-05-29 DIAGNOSIS — H02135 Senile ectropion of left lower eyelid: Secondary | ICD-10-CM | POA: Diagnosis not present

## 2019-05-30 ENCOUNTER — Ambulatory Visit
Admission: RE | Admit: 2019-05-30 | Discharge: 2019-05-30 | Disposition: A | Discharge status: Home / Self Care | Payer: Medicare HMO | Source: Ambulatory Visit | Attending: Urology | Admitting: Urology

## 2019-05-30 ENCOUNTER — Other Ambulatory Visit: Payer: Self-pay

## 2019-05-30 DIAGNOSIS — R3129 Other microscopic hematuria: Secondary | ICD-10-CM | POA: Insufficient documentation

## 2019-05-30 IMAGING — CT CT ABD-PEL WO/W CM
3 of 12 series · 10 of 46 positions shown, 16 images · IV contrast (omnipaque)
Comparison: No comparison studies available.

CLINICAL DATA: Microhematuria.

EXAM:
CT ABDOMEN AND PELVIS WITHOUT AND WITH CONTRAST
TECHNIQUE: Multidetector CT imaging of the abdomen and pelvis was performed
following the standard protocol before and following the bolus
administration of intravenous contrast.
CONTRAST:  150mL OMNIPAQUE IOHEXOL 300 MG/ML  SOLN

[Series 2: abd without pre 5.00 · axial · non-contrast · 0.77mm/px · z∈[-1465,-1125]mm · 7 of 92 slices shown, 12 images]
[im 12/92  soft-tissue]
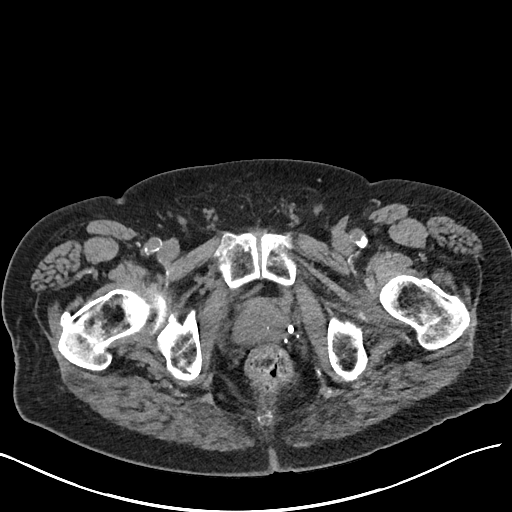
[im 12/92  bone]
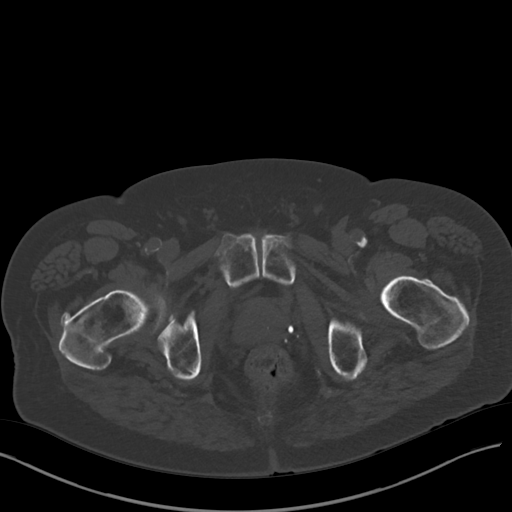
[im 23/92  soft-tissue]
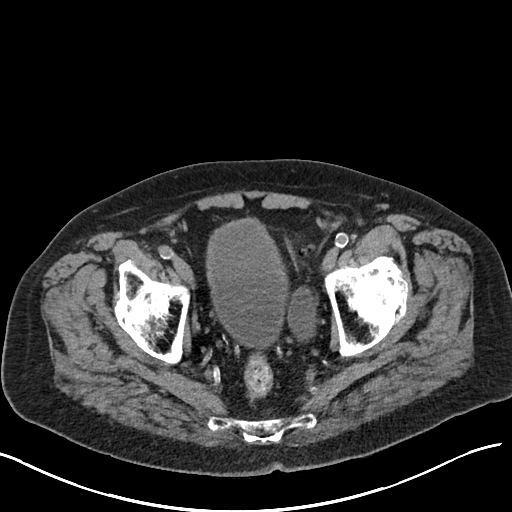
[im 35/92  soft-tissue]
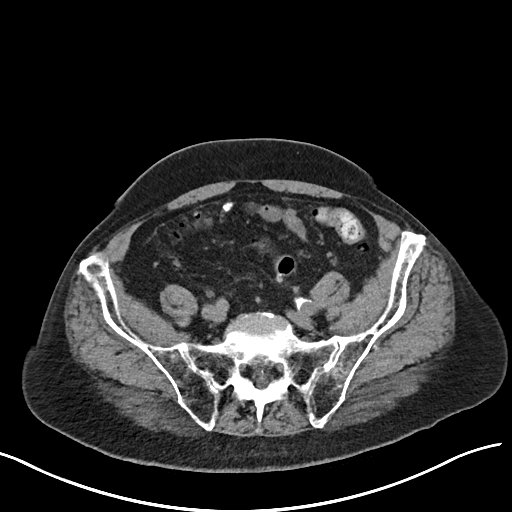
[im 46/92  soft-tissue]
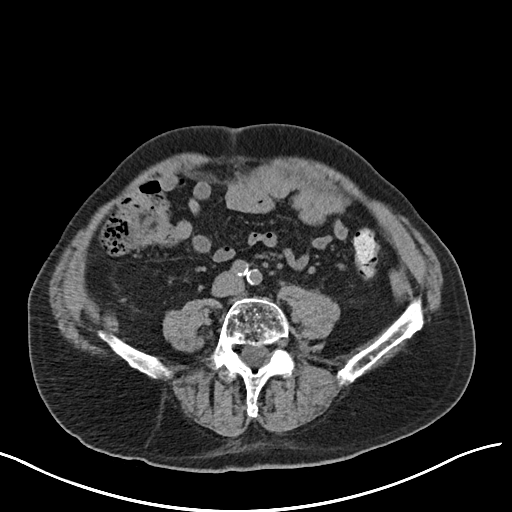
[im 46/92  lung]
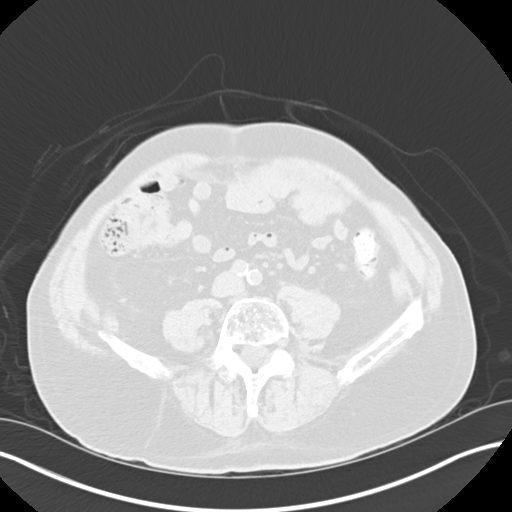
[im 57/92  soft-tissue]
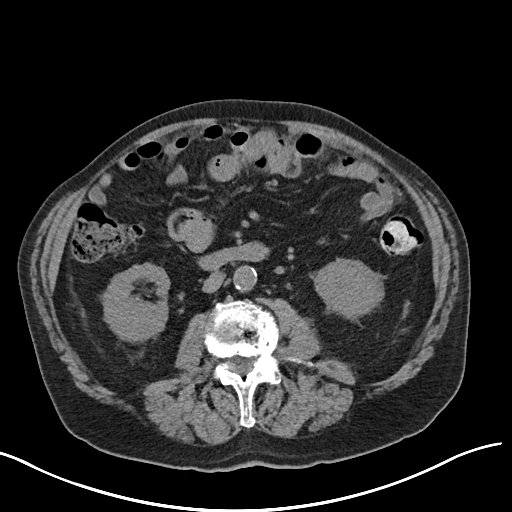
[im 57/92  lung]
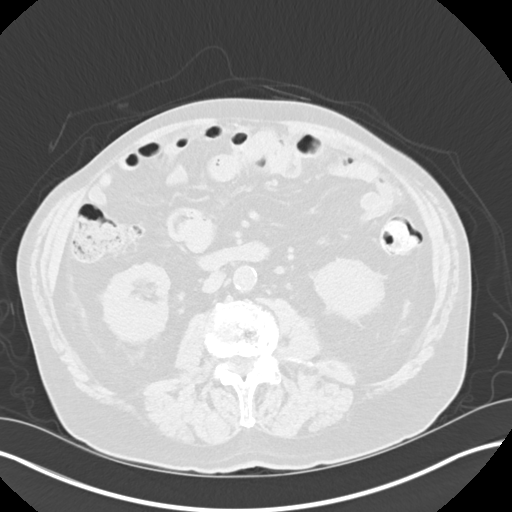
[im 69/92  soft-tissue]
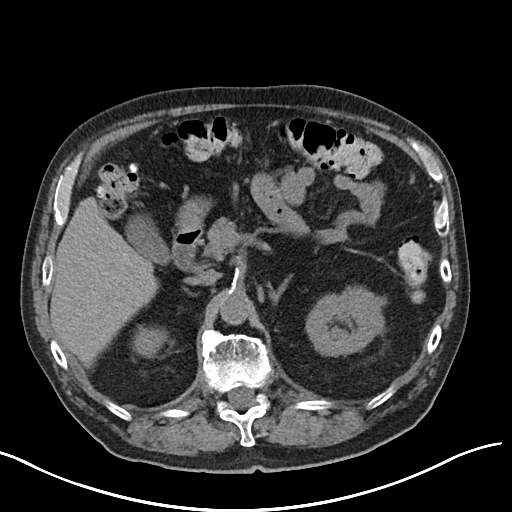
[im 69/92  lung]
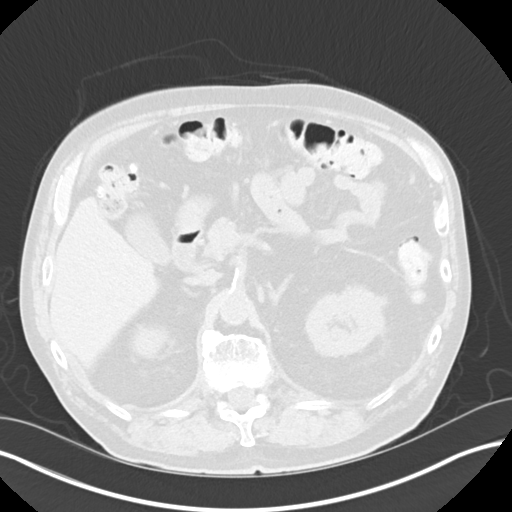
[im 80/92  soft-tissue]
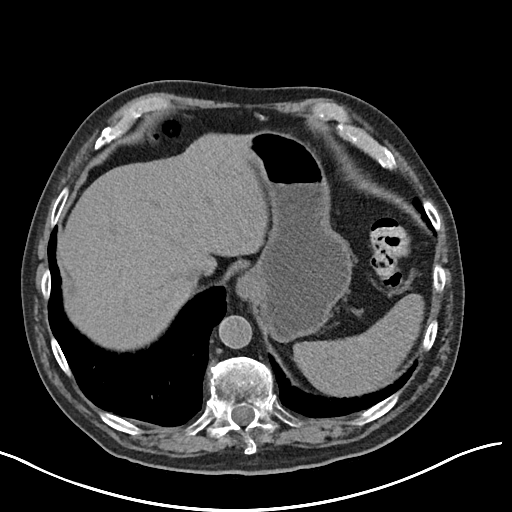
[im 80/92  lung]
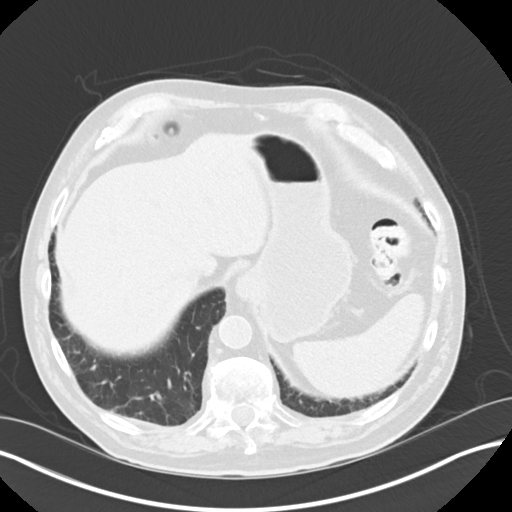

[Series 9: axial with hematuria with 5.00 · axial · 0.77mm/px · 1 of 92 slices shown]
[im 14/92  soft-tissue]
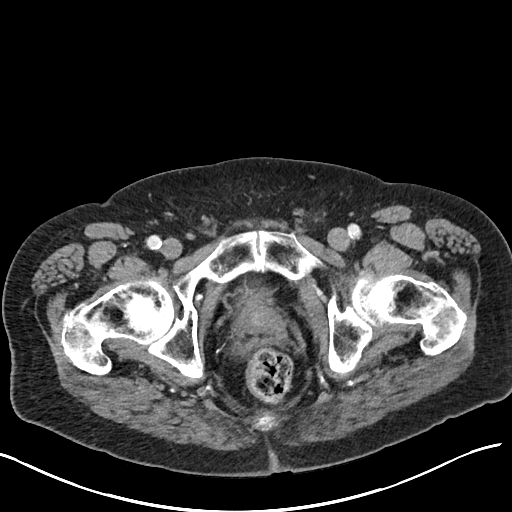

[Series 20: cor delay delay prone 2.00 cor · coronal · delayed · 0.74mm/px · 2 of 170 slices shown, 3 images]
[im 57/170  soft-tissue]
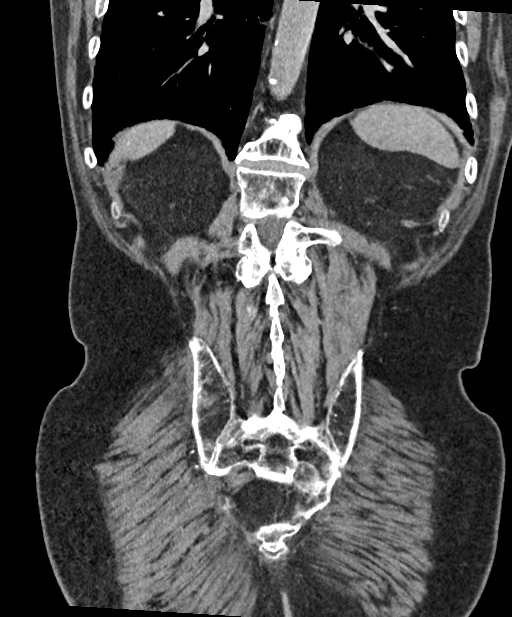
[im 57/170  bone]
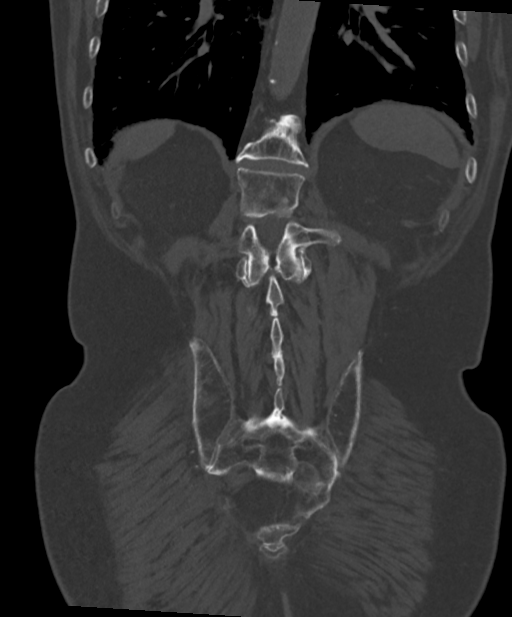
[im 113/170  soft-tissue]
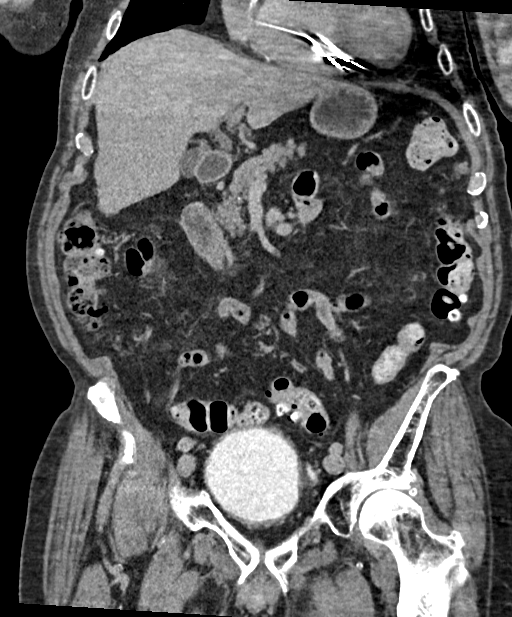

[10 of 46 positions shown; findings below may reference images not displayed]

FINDINGS: Lower chest: Chronic interstitial changes noted in the lung bases. 4
mm right lower lobe pulmonary nodule noted on image 10/series 19.

Hepatobiliary: No suspicious focal abnormality within the liver
parenchyma. There is no evidence for gallstones, gallbladder wall
thickening, or pericholecystic fluid. No intrahepatic or
extrahepatic biliary dilation.

Pancreas: No focal mass lesion. No dilatation of the main duct. No
intraparenchymal cyst. No peripancreatic edema.

Spleen: No splenomegaly. No focal mass lesion.

Adrenals/Urinary Tract: No adrenal nodule or mass.

Precontrast imaging shows no stones in either kidney. No ureteral or
bladder stones.

Imaging after IV contrast administration shows no suspicious
enhancing lesion in either kidney. 8 mm low-density lesion upper
pole cortex right kidney ([DATE]) is too small to characterize but is
likely a cyst.

Delayed imaging shows no wall thickening or soft tissue filling
defect in either intrarenal collecting system or renal pelvis.
Patient noted incidentally to have a duplicated right intrarenal
collecting system. Both ureters are well opacified and normal by CT
imaging. No focal bladder wall abnormality. Prominent left bladder
diverticulum noted. There is some mass-effect on the inferior
bladder from the prostate gland.

Stomach/Bowel: Stomach is unremarkable. No gastric wall thickening.
No evidence of outlet obstruction. Duodenum is normally positioned
As is the ligament of Treitz. No small bowel wall thickening. No
small bowel dilatation. The terminal ileum is normal. The appendix
is not visualized, but there is no edema or inflammation in the
region of the cecum. No gross colonic mass. No colonic wall
thickening. Diverticuli are seen scattered along the entire length
of the colon without CT findings of diverticulitis. Diverticular
changes are most advanced in the left colon.

Vascular/Lymphatic: There is abdominal aortic atherosclerosis
without aneurysm. There is no gastrohepatic or hepatoduodenal
ligament lymphadenopathy. No intraperitoneal or retroperitoneal
lymphadenopathy. No pelvic sidewall lymphadenopathy.

Reproductive: Prostate gland is not substantially enlarged but does
generates some mass-effect on the inferior bladder.

Other: Peritoneal thickening noted anterior abdomen with associated
small bowel adhesions. Although subtle, this is visible on axial
images 35/9, 44/9 and 49/9. Abnormality is also demonstrated on
sagittal images (see 69, 75, and 100 of series 14. no
intraperitoneal free fluid.

Musculoskeletal: No worrisome lytic or sclerotic osseous
abnormality.
IMPRESSION: 1. No definite CT findings to explain the patient's history of
microhematuria.
2. Subtle plaque-like peritoneal thickening in the anterior abdomen
with associated adhesions of small-bowel. Imaging features are
indeterminate, but metastatic disease cannot be excluded. No
associated intraperitoneal free fluid.
3.  Aortic Atherosclerois ([KH]-170.0)
4. 4 mm right lower lobe pulmonary nodule. No follow-up needed if
patient is low-risk. Non-contrast chest CT can be considered in 12
months if patient is high-risk. This recommendation follows the
consensus statement: Guidelines for Management of Incidental
Pulmonary Nodules Detected on CT Images: From the [HOSPITAL]

## 2019-05-30 MED ORDER — IOHEXOL 300 MG/ML  SOLN
150.0000 mL | Freq: Once | INTRAMUSCULAR | Status: AC | PRN
  Administered 2019-05-30: 150 mL via INTRAVENOUS

## 2019-06-09 ENCOUNTER — Other Ambulatory Visit: Payer: Self-pay

## 2019-06-09 ENCOUNTER — Encounter: Payer: Self-pay | Admitting: Urology

## 2019-06-09 ENCOUNTER — Ambulatory Visit (INDEPENDENT_AMBULATORY_CARE_PROVIDER_SITE_OTHER): Payer: Medicare HMO | Admitting: Urology

## 2019-06-09 VITALS — BP 157/73 | HR 84 | Ht 71.0 in | Wt 195.0 lb

## 2019-06-09 DIAGNOSIS — R935 Abnormal findings on diagnostic imaging of other abdominal regions, including retroperitoneum: Secondary | ICD-10-CM

## 2019-06-09 DIAGNOSIS — R3129 Other microscopic hematuria: Secondary | ICD-10-CM | POA: Diagnosis not present

## 2019-06-09 LAB — URINALYSIS, COMPLETE
Bilirubin, UA: NEGATIVE
Glucose, UA: NEGATIVE
Ketones, UA: NEGATIVE
Leukocytes,UA: NEGATIVE
Nitrite, UA: NEGATIVE
Protein,UA: NEGATIVE
Specific Gravity, UA: 1.025 (ref 1.005–1.030)
Urobilinogen, Ur: 0.2 mg/dL (ref 0.2–1.0)
pH, UA: 6 (ref 5.0–7.5)

## 2019-06-09 LAB — MICROSCOPIC EXAMINATION: Bacteria, UA: NONE SEEN

## 2019-06-09 NOTE — Progress Notes (Signed)
   06/09/19  CC:  Chief Complaint  Patient presents with  . Cysto    HPI: 85 y.o. male with microhematuria  Blood pressure (!) 157/73, pulse 84, height 5\' 11"  (1.803 m), weight 195 lb (88.5 kg). NED. A&Ox3.   No respiratory distress   Abd soft, NT, ND Normal phallus with bilateral descended testicles  Cystoscopy Procedure Note  Patient identification was confirmed, informed consent was obtained, and patient was prepped using Betadine solution.  Lidocaine jelly was administered per urethral meatus.     Pre-Procedure: - Inspection reveals a normal caliber urethral meatus.  Procedure: The flexible cystoscope was introduced without difficulty - No urethral strictures/lesions are present. - Prominent lateral lobe enlargement prostate with hypervascularity - Small median lobe - Bilateral ureteral orifices identified - Bladder mucosa  reveals no ulcers, tumors, or lesions - No bladder stones - Mild to moderate trabeculation - Left bladder wall diverticulum free of tumor  Retroflexion shows small intravesical median lobe   Post-Procedure: - Patient tolerated the procedure well  Assessment/ Plan: -Microhematuria most likely secondary to BPH -CTU showed no upper tract abnormalities however the radiologist did make a comment regarding incidental peritoneal thickening of the anterior abdomen with associated small bowel adhesions which were felt to be indeterminant however metastatic disease cannot be excluded. -Will get a medical oncology opinion regarding further evaluation/monitoring.   Abbie Sons, MD

## 2019-06-09 NOTE — Addendum Note (Signed)
Addended by: Donalee Citrin on: 06/09/2019 02:14 PM   Modules accepted: Orders

## 2019-06-10 ENCOUNTER — Other Ambulatory Visit: Payer: Self-pay

## 2019-06-11 ENCOUNTER — Other Ambulatory Visit: Payer: Self-pay

## 2019-06-11 ENCOUNTER — Inpatient Hospital Stay: Payer: Medicare HMO | Attending: Internal Medicine | Admitting: Internal Medicine

## 2019-06-11 ENCOUNTER — Encounter: Payer: Self-pay | Admitting: Internal Medicine

## 2019-06-11 DIAGNOSIS — Z79899 Other long term (current) drug therapy: Secondary | ICD-10-CM | POA: Insufficient documentation

## 2019-06-11 DIAGNOSIS — I10 Essential (primary) hypertension: Secondary | ICD-10-CM | POA: Diagnosis not present

## 2019-06-11 DIAGNOSIS — R5383 Other fatigue: Secondary | ICD-10-CM | POA: Diagnosis not present

## 2019-06-11 DIAGNOSIS — R5381 Other malaise: Secondary | ICD-10-CM | POA: Insufficient documentation

## 2019-06-11 DIAGNOSIS — R911 Solitary pulmonary nodule: Secondary | ICD-10-CM | POA: Diagnosis not present

## 2019-06-11 DIAGNOSIS — E785 Hyperlipidemia, unspecified: Secondary | ICD-10-CM | POA: Insufficient documentation

## 2019-06-11 DIAGNOSIS — Z7901 Long term (current) use of anticoagulants: Secondary | ICD-10-CM | POA: Insufficient documentation

## 2019-06-11 DIAGNOSIS — K669 Disorder of peritoneum, unspecified: Secondary | ICD-10-CM

## 2019-06-11 DIAGNOSIS — K668 Other specified disorders of peritoneum: Secondary | ICD-10-CM | POA: Insufficient documentation

## 2019-06-11 DIAGNOSIS — M8580 Other specified disorders of bone density and structure, unspecified site: Secondary | ICD-10-CM | POA: Insufficient documentation

## 2019-06-11 DIAGNOSIS — N401 Enlarged prostate with lower urinary tract symptoms: Secondary | ICD-10-CM | POA: Diagnosis not present

## 2019-06-11 DIAGNOSIS — Z7982 Long term (current) use of aspirin: Secondary | ICD-10-CM | POA: Diagnosis not present

## 2019-06-11 DIAGNOSIS — R3129 Other microscopic hematuria: Secondary | ICD-10-CM | POA: Insufficient documentation

## 2019-06-11 NOTE — Assessment & Plan Note (Addendum)
#  Subtle peritoneal thickening/plaques-incidental finding unclear etiology.  No primary noted based on the CT urogram.  Patient is currently asymptomatic.  I will review the imaging at the tumor conference-regarding surveillance/biopsy [less likely recommended].   # 11mm lung nodule-low risk [never smoker]-as per radiology no imaging needed.  # microscopic hematuria sec to BPH-[dr.Stoiff].  Recent labs PCPs office showed normal hemoglobin/chemistries.  Thank you Dr.Stoiff for allowing me to participate in the care of your pleasant patient. Please do not hesitate to contact me with questions or concerns in the interim.  # DISPOSITION: We will call regarding tumor conference discussion # follow up TBD- Dr.B

## 2019-06-11 NOTE — Progress Notes (Signed)
[facility] NOTE  Patient Care Team: Ezequiel Kayser, MD as PCP - General (Internal Medicine)  CHIEF COMPLAINTS/PURPOSE OF CONSULTATION: Peritoneal thickening  # NOV 2020- [incidental/hematuria work-up]-subtle peritoneal thickening/plaque-like lesions  #Microscopic hematuria- sec to BPH [cystoscopy/CT urogram negative; Dr.Stoiff]  # on eliquis ? A.fib/pacemaker;   Oncology History   No history exists.     HISTORY OF PRESENTING ILLNESS:  Christopher [name] 85 y.o.  male who is in good health both mentally and physically given his age has been referred to [place] for abnormal findings noted on CT scan.  Patient states that he was recently evaluated by urology for urinary frequency/urgency.  As per the work-up noted to have microscopic hematuria x2.  He had a cystoscopy was negative.  He also had a CT urogram that was negative for any source of his hematuria-however showed subtle peritoneal thickening plaque-like lesions in the abdomen.  No other primary source was noted.  Also found to have a 4 mm right lower lobe lung nodule.  Has been referred to [place] for further evaluation.  Patient denies any blood in stools or black or stools.  Her colonoscopy many years ago [2013-tubular adenoma].  No abdominal pain but no abdominal bloating.  No exposure to asbestos.    Review of Systems  Constitutional: Positive for malaise/fatigue. Negative for chills, diaphoresis, fever and weight loss.  HENT: Negative for nosebleeds and sore throat.   Eyes: Negative for double vision.  Respiratory: Negative for cough, hemoptysis, sputum production, shortness of breath and wheezing.   Cardiovascular: Negative for chest pain, palpitations, orthopnea and leg swelling.  Gastrointestinal: Negative for abdominal pain, blood in stool, constipation, diarrhea, heartburn, melena, nausea and vomiting.  Genitourinary: Negative for dysuria, frequency and urgency.  Musculoskeletal: Negative for back pain  and joint pain.  Skin: Negative.  Negative for itching and rash.  Neurological: Negative for dizziness, tingling, focal weakness, weakness and headaches.  Endo/Heme/Allergies: Does not bruise/bleed easily.  Psychiatric/Behavioral: Negative for depression. The patient is not nervous/anxious and does not have insomnia.      MEDICAL HISTORY:  Past Medical History:  Diagnosis Date  . BPH (benign prostatic hyperplasia)   . ED (erectile dysfunction)   . Gilbert disease   . HLD (hyperlipidemia)   . Hypertension   . Macular degeneration   . Osteopenia   . Sensorineural hearing loss     SURGICAL HISTORY: Past Surgical History:  Procedure Laterality Date  . CATARACT EXTRACTION    . HEMORRHOIDECTOMY WITH HEMORRHOID BANDING    . KNEE ARTHROSCOPY    . PACEMAKER INSERTION N/A 03/03/2015   Procedure: INSERTION DUAL LEAD PACEMAKER;  Surgeon: Isaias Cowman, MD;  Location: [facility] ORS;  Service: Cardiovascular;  Laterality: N/A;  . TONSILLECTOMY      SOCIAL HISTORY: Social History   Socioeconomic History  . Marital status: Widowed    Spouse name: Not on file  . Number of children: Not on file  . Years of education: Not on file  . Highest education level: Not on file  Occupational History  . Not on file  Social Needs  . Financial resource strain: Not on file  . Food insecurity    Worry: Not on file    Inability: Not on file  . Transportation needs    Medical: Not on file    Non-medical: Not on file  Tobacco Use  . Smoking status: Never Smoker  . Smokeless tobacco: Never Used  Substance and Sexual Activity  . Alcohol use: Yes  .  Drug use: No  . Sexual activity: Yes    Birth control/protection: None  Lifestyle  . Physical activity    Days per week: Not on file    Minutes per session: Not on file  . Stress: Not on file  Relationships  . Social Herbalist on phone: Not on file    Gets together: Not on file    Attends religious service: Not on file    Active  member of club or organization: Not on file    Attends meetings of clubs or organizations: Not on file    Relationship status: Not on file  . Intimate partner violence    Fear of current or ex partner: Not on file    Emotionally abused: Not on file    Physically abused: Not on file    Forced sexual activity: Not on file  Other Topics Concern  . Not on file  Social History Narrative   Retd. Civil engineer, contracting; [employer]; self. Never smoked; alcohol- glass wine/drink every night.      FAMILY HISTORY: Family History  Problem Relation Age of Onset  . Diabetes Mother   . Lung cancer Father   . Cancer Paternal Uncle   . Prostate cancer Neg Hx   . Bladder Cancer Neg Hx   . Kidney cancer Neg Hx     ALLERGIES:  is allergic to atenolol.  MEDICATIONS:  Current Outpatient Medications  Medication Sig Dispense Refill  . acetaminophen (TYLENOL) 500 MG tablet Take by mouth.    Marland Kitchen alfuzosin (UROXATRAL) 10 MG 24 hr tablet Take 1 tablet (10 mg total) by mouth daily with breakfast. 90 tablet 3  . apixaban (ELIQUIS) 5 MG TABS tablet Take by mouth.    Marland Kitchen aspirin EC 81 MG tablet Take by mouth.    Marland Kitchen atorvastatin (LIPITOR) 10 MG tablet Take 1 tablet by mouth daily.    . Cholecalciferol (VITAMIN D3) 50 MCG (2000 UT) capsule Take 1 capsule by mouth daily.     . finasteride (PROSCAR) 5 MG tablet Take 1 tablet (5 mg total) by mouth daily. 90 tablet 3  . metoprolol succinate (TOPROL-XL) 25 MG 24 hr tablet Take by mouth.    . metoprolol succinate (TOPROL-XL) 50 MG 24 hr tablet     . Multiple Vitamin (MULTI-VITAMINS) TABS Take 1 tablet by mouth daily.    . Omega-3 Fatty Acids (FISH OIL) 1000 MG CAPS Take 1 tablet by mouth 2 (two) times daily.    Marland Kitchen erythromycin ophthalmic ointment Place along incisions twice daily    . fluticasone (FLONASE) 50 MCG/ACT nasal spray Place into the nose.    . hydrochlorothiazide (HYDRODIURIL) 25 MG tablet Take 1 tablet (25 mg total) by mouth daily. (Patient not taking: Reported on  06/11/2019) 30 tablet 0  . lisinopril (PRINIVIL,ZESTRIL) 20 MG tablet Take 0.5 tablets (10 mg total) by mouth daily. 30 tablet 0  . metoprolol tartrate (LOPRESSOR) 25 MG tablet Take 75 mg by mouth daily.    . Multiple Vitamins-Minerals (OCUVITE PRESERVISION PO) Take 1 tablet by mouth 2 (two) times daily.     No current facility-administered medications for this visit.       Marland Kitchen  PHYSICAL EXAMINATION: ECOG PERFORMANCE STATUS: 0 - Asymptomatic  Vitals:   06/11/19 1120  BP: (!) 174/84  Pulse: 61  Temp: (!) 96.8 F (36 C)   Filed Weights   06/11/19 1120  Weight: 198 lb 8 oz (90 kg)    Physical Exam  Constitutional:  He is oriented to person, place, and time and well-developed, well-nourished, and in no distress.  HENT:  Head: Normocephalic and atraumatic.  Mouth/Throat: Oropharynx is clear and moist. No oropharyngeal exudate.  Eyes: Pupils are equal, round, and reactive to light.  Neck: Normal range of motion. Neck supple.  Cardiovascular: Normal rate and regular rhythm.  Pulmonary/Chest: No respiratory distress. He has no wheezes.  Abdominal: Soft. Bowel sounds are normal. He exhibits no distension and no mass. There is no abdominal tenderness. There is no rebound and no guarding.  Musculoskeletal: Normal range of motion.        General: No tenderness or edema.  Neurological: He is alert and oriented to person, place, and time.  Skin: Skin is warm.  Psychiatric: Affect normal.     LABORATORY DATA:  I have reviewed the data as listed Lab Results  Component Value Date   WBC 4.9 05/18/2018   HGB 13.1 05/18/2018   HCT 40.6 05/18/2018   MCV 98.1 05/18/2018   PLT 218 05/18/2018   No results for input(s): NA, K, CL, CO2, GLUCOSE, BUN, CREATININE, CALCIUM, GFRNONAA, GFRAA, PROT, ALBUMIN, AST, ALT, ALKPHOS, BILITOT, BILIDIR, IBILI in the last 8760 hours.  RADIOGRAPHIC STUDIES: I have personally reviewed the radiological images as listed and agreed with the findings in the  report. Ct Hematuria Workup  Result Date: 05/30/2019 CLINICAL DATA:  Microhematuria. EXAM: CT ABDOMEN AND PELVIS WITHOUT AND WITH CONTRAST TECHNIQUE: Multidetector CT imaging of the abdomen and pelvis was performed following the standard protocol before and following the bolus administration of intravenous contrast. CONTRAST:  135mL OMNIPAQUE IOHEXOL 300 MG/ML  SOLN COMPARISON:  No comparison studies available. FINDINGS: Lower chest: Chronic interstitial changes noted in the lung bases. 4 mm right lower lobe pulmonary nodule noted on image 10/series 19. Hepatobiliary: No suspicious focal abnormality within the liver parenchyma. There is no evidence for gallstones, gallbladder wall thickening, or pericholecystic fluid. No intrahepatic or extrahepatic biliary dilation. Pancreas: No focal mass lesion. No dilatation of the main duct. No intraparenchymal cyst. No peripancreatic edema. Spleen: No splenomegaly. No focal mass lesion. Adrenals/Urinary Tract: No adrenal nodule or mass. Precontrast imaging shows no stones in either kidney. No ureteral or bladder stones. Imaging after IV contrast administration shows no suspicious enhancing lesion in either kidney. 8 mm low-density lesion upper pole cortex right kidney (26/9) is too small to characterize but is likely a cyst. Delayed imaging shows no wall thickening or soft tissue filling defect in either intrarenal collecting system or renal pelvis. Patient noted incidentally to have a duplicated right intrarenal collecting system. Both ureters are well opacified and normal by CT imaging. No focal bladder wall abnormality. Prominent left bladder diverticulum noted. There is some mass-effect on the inferior bladder from the prostate gland. Stomach/Bowel: Stomach is unremarkable. No gastric wall thickening. No evidence of outlet obstruction. Duodenum is normally positioned As is the ligament of Treitz. No small bowel wall thickening. No small bowel dilatation. The terminal  ileum is normal. The appendix is not visualized, but there is no edema or inflammation in the region of the cecum. No gross colonic mass. No colonic wall thickening. Diverticuli are seen scattered along the entire length of the colon without CT findings of diverticulitis. Diverticular changes are most advanced in the left colon. Vascular/Lymphatic: There is abdominal aortic atherosclerosis without aneurysm. There is no gastrohepatic or hepatoduodenal ligament lymphadenopathy. No intraperitoneal or retroperitoneal lymphadenopathy. No pelvic sidewall lymphadenopathy. Reproductive: Prostate gland is not substantially enlarged but does generates some mass-effect  on the inferior bladder. Other: Peritoneal thickening noted anterior abdomen with associated small bowel adhesions. Although subtle, this is visible on axial images 35/9, 44/9 and 49/9. Abnormality is also demonstrated on sagittal images (see 69, 75, and 100 of series 14. no intraperitoneal free fluid. Musculoskeletal: No worrisome lytic or sclerotic osseous abnormality. IMPRESSION: 1. No definite CT findings to explain the patient's history of microhematuria. 2. Subtle plaque-like peritoneal thickening in the anterior abdomen with associated adhesions of small-bowel. Imaging features are indeterminate, but metastatic disease cannot be excluded. No associated intraperitoneal free fluid. 3.  Aortic Atherosclerois (ICD10-170.0) 4. 4 mm right lower lobe pulmonary nodule. No follow-up needed if patient is low-risk. Non-contrast chest CT can be considered in 12 months if patient is high-risk. This recommendation follows the consensus statement: Guidelines for Management of Incidental Pulmonary Nodules Detected on CT Images: From the Fleischner Society 2017; Radiology 2017; 284:228-243. Electronically Signed   By: Misty Stanley M.D.   On: 05/30/2019 12:20    ASSESSMENT & PLAN:   Lesion of peritoneum #Subtle peritoneal thickening/plaques-incidental finding  unclear etiology.  No primary noted based on the CT urogram.  Patient is currently asymptomatic.  I will review the imaging at the tumor conference-regarding surveillance/biopsy [less likely recommended].   # 7mm lung nodule-low risk [never smoker]-as per radiology no imaging needed.  # microscopic hematuria sec to BPH-[dr.Stoiff].  Recent labs PCPs office showed normal hemoglobin/chemistries.  Thank you Dr.Stoiff for allowing me to participate in the care of your pleasant patient. Please do not hesitate to contact me with questions or concerns in the interim.  # DISPOSITION: We will call regarding tumor conference discussion # follow up TBD- Dr.B   All questions were answered. The patient knows to call the clinic with any problems, questions or concerns.    Cammie Sickle, MD 06/11/2019 1:00 PM

## 2019-06-12 DIAGNOSIS — H04552 Acquired stenosis of left nasolacrimal duct: Secondary | ICD-10-CM | POA: Diagnosis not present

## 2019-06-12 DIAGNOSIS — H04553 Acquired stenosis of bilateral nasolacrimal duct: Secondary | ICD-10-CM | POA: Diagnosis not present

## 2019-06-12 DIAGNOSIS — H04551 Acquired stenosis of right nasolacrimal duct: Secondary | ICD-10-CM | POA: Diagnosis not present

## 2019-06-12 DIAGNOSIS — H04513 Dacryolith of bilateral lacrimal passages: Secondary | ICD-10-CM | POA: Diagnosis not present

## 2019-06-13 DIAGNOSIS — G3184 Mild cognitive impairment, so stated: Secondary | ICD-10-CM | POA: Diagnosis not present

## 2019-06-13 DIAGNOSIS — R2689 Other abnormalities of gait and mobility: Secondary | ICD-10-CM | POA: Diagnosis not present

## 2019-06-17 DIAGNOSIS — I4819 Other persistent atrial fibrillation: Secondary | ICD-10-CM | POA: Diagnosis not present

## 2019-06-19 ENCOUNTER — Other Ambulatory Visit: Payer: Self-pay

## 2019-06-19 ENCOUNTER — Telehealth: Payer: Self-pay | Admitting: Internal Medicine

## 2019-06-19 ENCOUNTER — Ambulatory Visit: Payer: Medicare HMO | Attending: Neurology

## 2019-06-19 ENCOUNTER — Other Ambulatory Visit: Payer: Medicare HMO

## 2019-06-19 DIAGNOSIS — M6281 Muscle weakness (generalized): Secondary | ICD-10-CM | POA: Diagnosis not present

## 2019-06-19 DIAGNOSIS — R2681 Unsteadiness on feet: Secondary | ICD-10-CM | POA: Insufficient documentation

## 2019-06-19 DIAGNOSIS — R2689 Other abnormalities of gait and mobility: Secondary | ICD-10-CM | POA: Insufficient documentation

## 2019-06-19 NOTE — Telephone Encounter (Signed)
Reviewed imaging of the tumor conference.  Left voicemail for the patient to call us back to discuss the recommendations from the tumor conference.  Check-CEA; also recommend colonoscopy/Follow-up imaging in 3 months.

## 2019-06-19 NOTE — Therapy (Signed)
[place] [facility] SERVICES [address] [place], [place], [address] Phone: [phone]   Fax:  [phone]  Physical Therapy Evaluation  Patient Details  Name: Christopher [name] MRN: [id number] Date of Birth: July 10, 1928 Referring Provider (PT): Ezequiel Kayser MD    Encounter Date: 06/19/2019  PT End of Session - 06/19/19 1602    Visit Number  1    Number of Visits  16    Date for PT Re-Evaluation  08/14/19    Authorization Type  1/10 eval 12/10    PT Start Time  1400    PT Stop Time  1503    PT Time Calculation (min)  63 min    Equipment Utilized During Treatment  Gait belt    Activity Tolerance  Patient tolerated treatment well    Behavior During Therapy  [facility] for tasks assessed/performed       Past Medical History:  Diagnosis Date  . BPH (benign prostatic hyperplasia)   . ED (erectile dysfunction)   . Gilbert disease   . HLD (hyperlipidemia)   . Hypertension   . Macular degeneration   . Osteopenia   . Sensorineural hearing loss     Past Surgical History:  Procedure Laterality Date  . CATARACT EXTRACTION    . HEMORRHOIDECTOMY WITH HEMORRHOID BANDING    . KNEE ARTHROSCOPY    . PACEMAKER INSERTION N/A 03/03/2015   Procedure: INSERTION DUAL LEAD PACEMAKER;  Surgeon: Isaias Cowman, MD;  Location: [facility] ORS;  Service: Cardiovascular;  Laterality: N/A;  . TONSILLECTOMY      There were no vitals filed for this visit.   Subjective Assessment - 06/19/19 1407    Subjective  Patient is a very pleasant 85 year old male who presents to physical therapy for imbalance and LE weakness.    Pertinent History  Patient is a very pleasant 85 year old male who presents with multifactorial imbalance and LE weakness Per previous documentation patient is having decreased arm swing and minimal cog wheeling of L>R. Is having difficulties with his balance and episodes of lightheadedness for a few years. Is also having LE weakness, with episodes  occurring once daily and lasting ~30 seconds and are accompanied with a spinning sensation. Provoked with standing up and stair climbing. Experiences dizziness and room spinning when standing too fast. Patient lives by himself after wife passed away 10/07/15. Patient does have persistent a-fib. Patient does reports two falls of off ladder in the past. PMh includes Rosanna Randy Syndrome, HTN, macular degeneration, osteopenia, persistent A-fib, SNHL (sensorineural hearing loss), symptomatic sinus bradycardia s/p permanent pacemaker.  Has a hard time standing up from ground when on hands and knees when gardening. Yolanda Bonine is living with him right now during the holidays.    Limitations  Lifting;Walking;Standing;House hold activities;Other (comment)   gardening   How long can you sit comfortably?  n/a    How long can you stand comfortably?  2 minutes    How long can you walk comfortably?  half a mile    Patient Stated Goals  more strength in legs    Currently in Pain?  No/denies         [facility] PT Assessment - 06/19/19 0001      Assessment   Medical Diagnosis  imbalance    Referring Provider (PT)  Ezequiel Kayser MD     Onset Date/Surgical Date  --   7-8 years ago    Hand Dominance  Right    Prior Therapy  yes a long time ago      Precautions   Precautions  ICD/Pacemaker      Restrictions   Weight Bearing Restrictions  No      Balance Screen   Has the patient fallen in the past 6 months  Yes    How many times?  1    Has the patient had a decrease in activity level because of a fear of falling?   Yes    Is the patient reluctant to leave their home because of a fear of falling?   No      Home Environment   Living Environment  Private residence    Living Arrangements  --   grandson   Type of [place] to enter    Entrance Stairs-Number of Steps  1    [place]  One level    [place] seat - built in      Prior Function   Level of Independence   Independent    Leisure  gardening      Cognition   Overall Cognitive Status  --   age norm memory loss; [facility]     Standardized Balance Assessment   Standardized Balance Assessment  Berg Balance Test;Dynamic Gait Index      Berg Balance Test   Sit to Stand  Able to stand without using hands and stabilize independently    Standing Unsupported  Able to stand safely 2 minutes    Sitting with Back Unsupported but Feet Supported on Floor or Stool  Able to sit safely and securely 2 minutes    Stand to Sit  Sits safely with minimal use of hands    Transfers  Able to transfer safely, minor use of hands    Standing Unsupported with Eyes Closed  Able to stand 10 seconds with supervision    Standing Unsupported with Feet Together  Able to place feet together independently and stand for 1 minute with supervision    From Standing, Reach Forward with Outstretched Arm  Can reach forward >12 cm safely (5")    From Standing Position, Pick up Object from [place] to pick up shoe safely and easily    From Standing Position, Turn to Look Behind Over each Shoulder  Looks behind one side only/other side shows less weight shift    Turn 360 Degrees  Able to turn 360 degrees safely but slowly    Standing Unsupported, Alternately Place Feet on Step/Stool  Able to stand independently and complete 8 steps >20 seconds    Standing Unsupported, One Foot in Front  Able to plae foot ahead of the other independently and hold 30 seconds    Standing on One Leg  Able to lift leg independently and hold equal to or more than 3 seconds    Total Score  46      Dynamic Gait Index   Level Surface  Normal    Change in Gait Speed  Mild Impairment    Gait with Horizontal Head Turns  Mild Impairment    Gait with Vertical Head Turns  Normal    Gait and Pivot Turn  Mild Impairment    Step Over Obstacle  Mild Impairment    Step Around Obstacles  Mild Impairment    Steps  Mild Impairment    Total Score  18       Patient is a  very pleasant 85 year old  male who presents with multifactorial imbalance and LE weakness Per previous documentation patient is having decreased arm swing and minimal cog wheeling of L>R. Is having difficulties with his balance and episodes of lightheadedness for a few years. Is also having LE weakness, with episodes occurring once daily and lasting ~30 seconds and are accompanied with a spinning sensation. Provoked with standing up and stair climbing. Experiences dizziness and room spinning when standing too fast. Patient lives by himself after wife passed away Oct 11, 2015. Patient does have persistent a-fib. Patient does reports two falls of off ladder in the past. PMH includes Rosanna Randy Syndrome, HTN, macular degeneration, osteopenia, persistent A-fib, SNHL (sensorineural hearing loss), symptomatic sinus bradycardia s/p permanent pacemaker.  Has a hard time standing up from ground when on hands and knees when gardening. Yolanda Bonine is living with him right now during the holidays.     PAIN: Back pain: with twisting motions  Worst: 6-7/10  POSTURE: Slight trunk flexion in sitting Standing: bilateral knee flexion : slight crouch.   PROM/AROM: Decreased hip extension bilaterally: functional range however  STRENGTH:  Graded on a 0-5 scale Muscle Group Left Right  Hip Flex 4-/5 4-/5  Hip Abd 4-/5 4-/5  Hip Add 3+/5 3+/5  Hip Ext 3+/5 3+/5  Hip IR/ER    Knee Flex 4-/5 4-/5  Knee Ext 4-/5 4-/5  Ankle DF 4-/5 4/5  Ankle PF 4-/5 4/5   SENSATION: Slight sensory loss bilateral stocking region   FUNCTIONAL MOBILITY: STS: able to perform without UE support  BALANCE: Dynamic Sitting Balance  Normal Able to sit unsupported and weight shift across midline maximally   Good Able to sit unsupported and weight shift across midline moderately   Good-/Fair+ Able to sit unsupported and weight shift across midline minimally x  Fair Minimal weight shifting ipsilateral/front, difficulty crossing midline   Fair-  Reach to ipsilateral side and unable to weight shift   Poor + Able to sit unsupported with min A and reach to ipsilateral side, unable to weight shift   Poor Able to sit unsupported with mod A and reach ipsilateral/front-can't cross midline     Standing Dynamic Balance  Normal Stand independently unsupported, able to weight shift and cross midline maximally   Good Stand independently unsupported, able to weight shift and cross midline moderately   Good-/Fair+ Stand independently unsupported, able to weight shift across midline minimally x  Fair Stand independently unsupported, weight shift, and reach ipsilaterally, loss of balance when crossing midline   Poor+ Able to stand with Min A and reach ipsilaterally, unable to weight shift   Poor Able to stand with Mod A and minimally reach ipsilaterally, unable to cross midline.     Static Sitting Balance  Normal Able to maintain balance against maximal resistance   Good Able to maintain balance against moderate resistance   Good-/Fair+ Accepts minimal resistance x  Fair Able to sit unsupported without balance loss and without UE support   Poor+ Able to maintain with Minimal assistance from individual or chair   Poor Unable to maintain balance-requires mod/max support from individual or chair     Static Standing Balance  Normal Able to maintain standing balance against maximal resistance   Good Able to maintain standing balance against moderate resistance   Good-/Fair+ Able to maintain standing balance against minimal resistance   Fair Able to stand unsupported without UE support and without LOB for 1-2 min x  Fair- Requires Min A and UE support to maintain standing without loss of  balance   Poor+ Requires mod A and UE support to maintain standing without loss of balance   Poor Requires max A and UE support to maintain standing balance without loss       GAIT: Ambulates without AD with excessive knee flexion and limited hip extension  with fatigue; additionally demonstrates decreased heel strike bilaterally with fatigue   OUTCOME MEASURES: TEST Outcome Interpretation  5 times sit<>stand 14 sec no UE support >68 yo, >15 sec indicates increased risk for falls  10 meter walk test               >1.0   m/s <1.0 m/s indicates increased risk for falls; limited community ambulator  ABC 73%   6 minute walk test      1202          Feet 1000 feet is community ambulator. Age norm 54 ft  Berg Balance Assessment [id number] <36/56 (100% risk for falls), 37-45 (80% risk for falls); 46-51 (>50% risk for falls); 52-55 (lower risk <25% of falls)  DGI 18/24       Objective measurements completed on examination: See above findings.     Access Code: [email or web address]  URL: [email or web address]  Date: 06/19/2019  Prepared by: Janna Arch   Exercises Standing Tandem Balance with Counter Support - 2 reps - 2 sets - 30 hold - 1x daily - 7x weekly Standing March with Counter Support - 10 reps - 2 sets - 5 hold - 1x daily - 7x weekly Standing Hip Extension with Counter Support - 10 reps - 2 sets - 5 hold - 1x daily - 7x weekly Standing Hip Abduction with Counter Support - 10 reps - 2 sets - 5 hold - 1x daily - 7x weekly          PT Education - 06/19/19 1601    Education Details  goals, POC, HEP    Person(s) Educated  Patient    Methods  Explanation;Demonstration;Tactile cues;Verbal cues;Handout    Comprehension  Verbalized understanding;Returned demonstration;Verbal cues required;Tactile cues required       PT Short Term Goals - 06/19/19 1606      PT SHORT TERM GOAL #1   Title  Patient will be independent in home exercise program to improve strength/mobility for better functional independence with ADLs.    Baseline  12/10: HEP given    Time  4    Period  Weeks    Status  New    Target Date  07/17/19        PT Long Term Goals - 06/19/19 1607      PT LONG TERM GOAL #1   Title  Patient will increase Berg Balance  score by > 6 points (52/56) to demonstrate decreased fall risk during functional activities.    Baseline  12/10: 46/56    Time  8    Period  Weeks    Status  New    Target Date  08/14/19      PT LONG TERM GOAL #2   Title  Patient will increase six minute walk test distance to >1431 ft for progression to community ambulator/age norms and improve gait ability    Baseline  12/10: 1202 ft    Time  8    Period  Weeks    Status  New    Target Date  08/14/19      PT LONG TERM GOAL #3   Title  Patient will increase dynamic gait index score to >21/24 as to  demonstrate reduced fall risk and improved dynamic gait balance for better safety with community/home ambulation.    Baseline  12/10: 18/24    Time  8    Period  Weeks    Status  New    Target Date  08/14/19      PT LONG TERM GOAL #4   Title  Patient will increase BLE gross strength to 4+/5 as to improve functional strength for independent gait, increased standing tolerance and increased ADL ability.    Baseline  12/10: grossly 4-/5 with adduction and hip extension 3+/5    Time  8    Period  Weeks    Status  New    Target Date  08/14/19             Plan - 06/19/19 1603    Clinical Impression Statement  Patient is a pleasant 85 year old male who presents with imbalance and LE weakness. He demonstrates functional ambulation with short durations however with increased duration of ambulation he fatigues quickly and demonstrates increased crouch pattern of ambulation and decreased heel strike. Single limb stability and head turns are challenging to patient stability. We will benefit from skilled physical therapy to increase his strength, stability, and capacity for functional mobility for increased quality of life and reduced fall risk.    Personal Factors and Comorbidities  Age;Comorbidity 3+;Past/Current Experience;Time since onset of injury/illness/exacerbation    Comorbidities  Gilbert Syndrome, HTN, macular degeneration,  osteopenia, persistent A-fib, SNHL (sensorineural hearing loss), symptomatic sinus bradycardia s/p permanent pacemaker.    Examination-Activity Limitations  Bend;Stairs;Locomotion Level;Other;Reach Overhead    Examination-Participation Restrictions  Church;Cleaning;Community Activity;Laundry;Shop;Volunteer;Yard Work    [employer]  Evolving/Moderate complexity    Clinical Decision Making  Moderate    Rehab Potential  Good    PT Frequency  2x / week    PT Duration  8 weeks    PT Treatment/Interventions  ADLs/Self Care Home Management;Canalith Repostioning;Biofeedback;Electrical Stimulation;Moist Heat;Functional mobility training;Stair training;Gait training;DME Instruction;Therapeutic activities;Therapeutic exercise;Balance training;Neuromuscular re-education;Manual techniques;Patient/family education;Passive range of motion;Energy conservation;Taping;Vestibular    PT Next Visit Plan  high level balance and prolonged ambulation    PT Home Exercise Plan  see above    Consulted and Agree with Plan of Care  Patient       Patient will benefit from skilled therapeutic intervention in order to improve the following deficits and impairments:  Abnormal gait, Cardiopulmonary status limiting activity, Decreased activity tolerance, Decreased balance, Decreased endurance, Decreased coordination, Decreased mobility, Decreased safety awareness, Difficulty walking, Decreased strength, Impaired flexibility, Impaired UE functional use, Postural dysfunction  Visit Diagnosis: Unsteadiness on feet  Other abnormalities of gait and mobility  Muscle weakness (generalized)     Problem List Patient Active Problem List   Diagnosis Date Noted  . Lesion of peritoneum 06/11/2019  . Imbalance 01/29/2019  . Lumbar degenerative disc disease 12/04/2018  . Primary osteoarthritis of left knee 04/11/2018  . Persistent atrial fibrillation ([place]) 10/11/2016  . Chronic right-sided low back pain  without sciatica 06/14/2015  . Dizziness 03/10/2015  . Symptomatic sinus bradycardia 03/10/2015  . Syncope and collapse 02/28/2015  . Symptomatic bradycardia 02/28/2015  . HTN (hypertension) 02/28/2015  . Hypercholesteremia 02/28/2015  . Benign prostatic hyperplasia with urinary frequency 02/28/2015  . Alcohol use 02/28/2015  . Other specified health status 02/28/2015  . Asymptomatic proteinuria 01/04/2015  . Chronic leukopenia 11/14/2013  . Glucose intolerance (impaired glucose tolerance) 11/14/2013  . High risk medication use 11/14/2013  . Dupuytren contracture 09/29/2013  .  ED (erectile dysfunction) 09/29/2013  . Gilbert syndrome 09/29/2013  . Macular degeneration 09/29/2013  . Osteopenia 09/29/2013  . SNHL (sensorineural hearing loss) 09/29/2013   Janna Arch, PT, DPT   06/19/2019, 4:14 PM  [place] [facility] SERVICES [address] [place], [place], [address] Phone: [phone]   Fax:  [phone]  Name: EION KOLIN MRN: [id number] Date of Birth: 13-Dec-1927

## 2019-06-19 NOTE — Progress Notes (Signed)
Tumor Board Documentation  Christopher [name] was presented by Dr Rogue Bussing at our Tumor Board on 06/19/2019, which included representatives from medical oncology, radiation oncology, navigation, pathology, radiology, surgical, surgical oncology, internal medicine, pharmacy, palliative care, research, genetics.  Christopher [name] currently presents as a new patient, for discussion with history of the following treatments: active survellience.  Additionally, we reviewed previous medical and familial history, history of present illness, and recent lab results along with all available histopathologic and imaging studies. The tumor board considered available treatment options and made the following recommendations: Additional screening, Biopsy Check GI markers, Colonoscopy, Open biopsy vs  FU CT in 3 months  The following procedures/referrals were also placed: No orders of the defined types were placed in this encounter.   Clinical Trial Status: not discussed   Staging used: Not Applicable  National site-specific guidelines   were discussed with respect to the case.  Tumor board is a meeting of clinicians from various specialty areas who evaluate and discuss patients for whom a multidisciplinary approach is being considered. Final determinations in the plan of care are those of the provider(s). The responsibility for follow up of recommendations given during tumor board is that of the provider.   Today's extended care, comprehensive team conference, Christopher [name] was not present for the discussion and was not examined.   Multidisciplinary Tumor Board is a multidisciplinary case peer review process.  Decisions discussed in the Multidisciplinary Tumor Board reflect the opinions of the specialists present at the conference without having examined the patient.  Ultimately, treatment and diagnostic decisions rest with the primary provider(s) and the patient.

## 2019-06-19 NOTE — Patient Instructions (Signed)
Access Code: [email or web address]  URL: [email or web address]  Date: 06/19/2019  Prepared by: Janna Arch   Exercises Standing Tandem Balance with Counter Support - 2 reps - 2 sets - 30 hold - 1x daily - 7x weekly Standing March with Counter Support - 10 reps - 2 sets - 5 hold - 1x daily - 7x weekly Standing Hip Extension with Counter Support - 10 reps - 2 sets - 5 hold - 1x daily - 7x weekly Standing Hip Abduction with Counter Support - 10 reps - 2 sets - 5 hold - 1x daily - 7x weekly

## 2019-06-23 ENCOUNTER — Telehealth: Payer: Self-pay | Admitting: Internal Medicine

## 2019-06-23 NOTE — Telephone Encounter (Signed)
On 12/11-spoke to patient regarding the recommendations of the tumor conference regarding further work-up with blood work/colonoscopy versus repeat imaging in 3 months.   Please schedule the patient-for labs this week- order CEA/PSA.  Will discuss further with pt PCP, re: the next plan.

## 2019-06-23 NOTE — Telephone Encounter (Signed)
I spoke to Dr.Thies re: pt's work up for peritoneal/omental nodularity. Defer to Oncology for further work up- discussed re: invasive GI endoscopies vs. Repeat imaging in 3 months.   C/T- please make sure labs are ordered/scheduled as recommended previously.

## 2019-06-23 NOTE — Telephone Encounter (Signed)
Left message for Dr.Thies to discuss pt's plan of care. GB

## 2019-06-24 ENCOUNTER — Ambulatory Visit: Payer: Medicare HMO

## 2019-06-24 ENCOUNTER — Other Ambulatory Visit: Payer: Self-pay | Admitting: Licensed Clinical Social Worker

## 2019-06-24 DIAGNOSIS — K668 Other specified disorders of peritoneum: Secondary | ICD-10-CM

## 2019-06-24 DIAGNOSIS — K669 Disorder of peritoneum, unspecified: Secondary | ICD-10-CM

## 2019-06-25 ENCOUNTER — Other Ambulatory Visit: Payer: Self-pay

## 2019-06-25 ENCOUNTER — Telehealth: Payer: Self-pay | Admitting: Internal Medicine

## 2019-06-25 NOTE — Telephone Encounter (Signed)
Spoke to pt re: my discussion with his PCP, Dr.Theis. discussed the pros and cons- of pursuing work up with EGD/colonoscopy Vs. More conservative approach of repeating CT scan in 3 months.   will await CEA/ from this week; if unremarkable- recommend CT scan in 3 months.

## 2019-06-26 ENCOUNTER — Ambulatory Visit: Payer: Medicare HMO

## 2019-06-26 ENCOUNTER — Inpatient Hospital Stay: Payer: Medicare HMO

## 2019-06-26 ENCOUNTER — Other Ambulatory Visit: Payer: Self-pay

## 2019-06-26 DIAGNOSIS — I10 Essential (primary) hypertension: Secondary | ICD-10-CM | POA: Diagnosis not present

## 2019-06-26 DIAGNOSIS — K668 Other specified disorders of peritoneum: Secondary | ICD-10-CM

## 2019-06-26 DIAGNOSIS — R3129 Other microscopic hematuria: Secondary | ICD-10-CM | POA: Diagnosis not present

## 2019-06-26 DIAGNOSIS — R5381 Other malaise: Secondary | ICD-10-CM | POA: Diagnosis not present

## 2019-06-26 DIAGNOSIS — K669 Disorder of peritoneum, unspecified: Secondary | ICD-10-CM

## 2019-06-26 DIAGNOSIS — E785 Hyperlipidemia, unspecified: Secondary | ICD-10-CM | POA: Diagnosis not present

## 2019-06-26 DIAGNOSIS — R5383 Other fatigue: Secondary | ICD-10-CM | POA: Diagnosis not present

## 2019-06-26 DIAGNOSIS — N401 Enlarged prostate with lower urinary tract symptoms: Secondary | ICD-10-CM | POA: Diagnosis not present

## 2019-06-26 DIAGNOSIS — R911 Solitary pulmonary nodule: Secondary | ICD-10-CM | POA: Diagnosis not present

## 2019-06-26 DIAGNOSIS — M8580 Other specified disorders of bone density and structure, unspecified site: Secondary | ICD-10-CM | POA: Diagnosis not present

## 2019-06-26 LAB — PSA: Prostatic Specific Antigen: 0.62 ng/mL (ref 0.00–4.00)

## 2019-06-27 LAB — CEA: CEA: 3.4 ng/mL (ref 0.0–4.7)

## 2019-06-30 ENCOUNTER — Telehealth: Payer: Self-pay | Admitting: Internal Medicine

## 2019-06-30 DIAGNOSIS — K668 Other specified disorders of peritoneum: Secondary | ICD-10-CM

## 2019-06-30 DIAGNOSIS — R911 Solitary pulmonary nodule: Secondary | ICD-10-CM

## 2019-06-30 DIAGNOSIS — K669 Disorder of peritoneum, unspecified: Secondary | ICD-10-CM

## 2019-06-30 NOTE — Telephone Encounter (Signed)
On 12/19-spoke to patient regarding results of blood work negative for malignancy.  Discussed the pros and cons of invasive testing like EGD colonoscopy versus surveillance imaging.  Given his age/asymptomatic-I think it is reasonable to plan surveillance imaging in 3 months.  Patient in agreement.  #C-  Schedule CT scan abdomen pelvis/ CT chest in 2 months from now; follow up with- MD 1-2 days later.   Thanks, GB

## 2019-07-01 ENCOUNTER — Ambulatory Visit: Payer: Medicare HMO

## 2019-07-10 ENCOUNTER — Ambulatory Visit: Payer: Medicare HMO

## 2019-07-15 ENCOUNTER — Ambulatory Visit: Payer: Medicare HMO

## 2019-07-15 DIAGNOSIS — Z7901 Long term (current) use of anticoagulants: Secondary | ICD-10-CM | POA: Insufficient documentation

## 2019-07-15 DIAGNOSIS — K1379 Other lesions of oral mucosa: Secondary | ICD-10-CM | POA: Diagnosis not present

## 2019-07-15 DIAGNOSIS — R319 Hematuria, unspecified: Secondary | ICD-10-CM | POA: Diagnosis not present

## 2019-07-15 DIAGNOSIS — M40294 Other kyphosis, thoracic region: Secondary | ICD-10-CM | POA: Diagnosis not present

## 2019-07-15 DIAGNOSIS — I480 Paroxysmal atrial fibrillation: Secondary | ICD-10-CM | POA: Diagnosis not present

## 2019-07-15 DIAGNOSIS — R609 Edema, unspecified: Secondary | ICD-10-CM | POA: Diagnosis not present

## 2019-07-15 DIAGNOSIS — M4056 Lordosis, unspecified, lumbar region: Secondary | ICD-10-CM | POA: Diagnosis not present

## 2019-07-15 DIAGNOSIS — I10 Essential (primary) hypertension: Secondary | ICD-10-CM | POA: Diagnosis not present

## 2019-07-15 DIAGNOSIS — K668 Other specified disorders of peritoneum: Secondary | ICD-10-CM | POA: Diagnosis not present

## 2019-07-16 ENCOUNTER — Other Ambulatory Visit: Payer: Self-pay

## 2019-07-16 ENCOUNTER — Encounter: Payer: Self-pay | Admitting: Urology

## 2019-07-16 ENCOUNTER — Ambulatory Visit: Payer: Medicare HMO | Admitting: Urology

## 2019-07-16 VITALS — BP 165/79 | HR 79 | Ht 71.0 in | Wt 195.4 lb

## 2019-07-16 DIAGNOSIS — R319 Hematuria, unspecified: Secondary | ICD-10-CM | POA: Diagnosis not present

## 2019-07-16 NOTE — Progress Notes (Signed)
07/16/2019 11:14 AM   [name] [birth year] [id number]  Referring provider: Ezequiel Kayser, MD [facility] [place],  [place] [address]  Chief Complaint  Patient presents with  . Hematuria    HPI: 86 y.o. male who recently underwent a microhematuria evaluation.  CTU showed no upper tract abnormalities and cystoscopy remarkable for BPH with prominent hypervascularity.  It was felt the prostate was his most likely source of microhematuria.  He is also on chronic anticoagulation.  He was referred to oncology for peritoneal thickening and is going to have repeat imaging in 3 months.  He presents today stating 10 days ago his first morning void was "dark" and lightens as the day progresses.  He states this has been fairly consistent over the last 10 days.  It is difficult for him to characterize the color of his urine but it is not burgundy or red.  No significant change in his voiding pattern.   PMH: Past Medical History:  Diagnosis Date  . BPH (benign prostatic hyperplasia)   . ED (erectile dysfunction)   . Gilbert disease   . HLD (hyperlipidemia)   . Hypertension   . Macular degeneration   . Osteopenia   . Sensorineural hearing loss     Surgical History: Past Surgical History:  Procedure Laterality Date  . CATARACT EXTRACTION    . HEMORRHOIDECTOMY WITH HEMORRHOID BANDING    . KNEE ARTHROSCOPY    . PACEMAKER INSERTION N/A 03/03/2015   Procedure: INSERTION DUAL LEAD PACEMAKER;  Surgeon: Isaias Cowman, MD;  Location: [facility] ORS;  Service: Cardiovascular;  Laterality: N/A;  . TONSILLECTOMY      Home Medications:  Allergies as of 07/16/2019      Reactions   Atenolol    Other reaction(s): Unknown      Medication List       Accurate as of July 16, 2019 11:14 AM. If you have any questions, ask your nurse or doctor.        STOP taking these medications   erythromycin ophthalmic ointment Stopped by: Abbie Sons, MD     fluticasone 50 MCG/ACT nasal spray Commonly known as: FLONASE Stopped by: Abbie Sons, MD   hydrochlorothiazide 25 MG tablet Commonly known as: HYDRODIURIL Stopped by: Abbie Sons, MD   lisinopril 20 MG tablet Commonly known as: ZESTRIL Stopped by: Abbie Sons, MD   Multi-Vitamins Tabs Stopped by: Abbie Sons, MD     TAKE these medications   acetaminophen 500 MG tablet Commonly known as: TYLENOL Take by mouth.   alfuzosin 10 MG 24 hr tablet Commonly known as: UROXATRAL Take 1 tablet (10 mg total) by mouth daily with breakfast.   apixaban 5 MG Tabs tablet Commonly known as: ELIQUIS Take by mouth.   aspirin EC 81 MG tablet Take by mouth.   atorvastatin 10 MG tablet Commonly known as: LIPITOR Take 1 tablet by mouth daily.   finasteride 5 MG tablet Commonly known as: PROSCAR Take 1 tablet (5 mg total) by mouth daily.   Fish Oil 1000 MG Caps Take 1 tablet by mouth 3 (three) times daily.   Fluzone High-Dose Quadrivalent 0.7 ML Susy Generic drug: Influenza Vac High-Dose Quad   metoprolol succinate 50 MG 24 hr tablet Commonly known as: TOPROL-XL What changed: Another medication with the same name was removed. Continue taking this medication, and follow the directions you see here. Changed by: Abbie Sons, MD   metoprolol tartrate 25 MG tablet  Commonly known as: LOPRESSOR Take 75 mg by mouth daily.   neomycin-polymyxin b-dexamethasone 3.5-10000-0.1 Susp Commonly known as: MAXITROL   OCUVITE PRESERVISION PO Take 1 tablet by mouth 2 (two) times daily.   Vitamin D3 50 MCG (2000 UT) capsule Take 1 capsule by mouth daily.       Allergies:  Allergies  Allergen Reactions  . Atenolol     Other reaction(s): Unknown    Family History: Family History  Problem Relation Age of Onset  . Diabetes Mother   . Lung cancer Father   . Cancer Paternal Uncle   . Prostate cancer Neg Hx   . Bladder Cancer Neg Hx   . Kidney cancer Neg Hx      Social History:  reports that he has never smoked. He has never used smokeless tobacco. He reports current alcohol use. He reports that he does not use drugs.  ROS: UROLOGY Frequent Urination?: Yes Hard to postpone urination?: Yes Burning/pain with urination?: Yes Get up at night to urinate?: No Leakage of urine?: No Urine stream starts and stops?: No Trouble starting stream?: No Do you have to strain to urinate?: No Blood in urine?: Yes Urinary tract infection?: No Sexually transmitted disease?: No Injury to kidneys or bladder?: No Painful intercourse?: No Weak stream?: No Erection problems?: No Penile pain?: No  Gastrointestinal Nausea?: No Vomiting?: No Indigestion/heartburn?: No Diarrhea?: No Constipation?: No  Constitutional Fever: No Night sweats?: No Weight loss?: No Fatigue?: No  Skin Skin rash/lesions?: No Itching?: No  Eyes Blurred vision?: No Double vision?: No  Ears/Nose/Throat Sore throat?: No Sinus problems?: No  Hematologic/Lymphatic Swollen glands?: No Easy bruising?: No  Cardiovascular Leg swelling?: Yes Chest pain?: No  Respiratory Cough?: No Shortness of breath?: No  Endocrine Excessive thirst?: No  Musculoskeletal Back pain?: Yes Joint pain?: No  Neurological Headaches?: No Dizziness?: No  Psychologic Depression?: No Anxiety?: No  Physical Exam: BP (!) 165/79 (BP Location: Left Arm, Patient Position: Sitting, Cuff Size: Normal)   Pulse 79   Ht 5\' 11"  (1.803 m)   Wt 195 lb 6.4 oz (88.6 kg)   BMI 27.25 kg/m   Constitutional:  Alert and oriented, No acute distress. HEENT: [place] AT, moist mucus membranes.  Trachea midline, no masses. Cardiovascular: No clubbing, cyanosis, or edema. Respiratory: Normal respiratory effort, no increased work of breathing. Skin: No rashes, bruises or suspicious lesions. Neurologic: Grossly intact, no focal deficits, moving all 4 extremities. Psychiatric: Normal mood and  affect.   Assessment & Plan:   Urinalysis today did show >30 RBCs and 11-30 RBCs. A urine culture was ordered.  If urine culture negative we will send a urine cytology.  Abbie Sons, [place] [address] [place], [place] [address] [phone]

## 2019-07-17 ENCOUNTER — Other Ambulatory Visit: Payer: Self-pay

## 2019-07-17 ENCOUNTER — Other Ambulatory Visit: Payer: Medicare HMO

## 2019-07-17 DIAGNOSIS — R3129 Other microscopic hematuria: Secondary | ICD-10-CM

## 2019-07-17 DIAGNOSIS — R319 Hematuria, unspecified: Secondary | ICD-10-CM | POA: Diagnosis not present

## 2019-07-18 ENCOUNTER — Ambulatory Visit: Payer: Medicare HMO

## 2019-07-18 ENCOUNTER — Other Ambulatory Visit: Payer: Medicare HMO

## 2019-07-18 ENCOUNTER — Telehealth: Payer: Self-pay

## 2019-07-18 LAB — URINALYSIS, COMPLETE
Bilirubin, UA: NEGATIVE
Bilirubin, UA: NEGATIVE
Glucose, UA: NEGATIVE
Glucose, UA: NEGATIVE
Ketones, UA: NEGATIVE
Ketones, UA: NEGATIVE
Nitrite, UA: NEGATIVE
Nitrite, UA: NEGATIVE
Specific Gravity, UA: 1.025 (ref 1.005–1.030)
Specific Gravity, UA: >1.03 — ABNORMAL HIGH (ref 1.005–1.030)
Urobilinogen, Ur: 0.2 mg/dL (ref 0.2–1.0)
Urobilinogen, Ur: 0.2 mg/dL (ref 0.2–1.0)
pH, UA: 6.5 (ref 5.0–7.5)
pH, UA: 7 (ref 5.0–7.5)

## 2019-07-18 LAB — MICROSCOPIC EXAMINATION
Epithelial Cells (non renal): >10 /hpf — AB (ref 0–10)
RBC, Urine: >30 /hpf — AB (ref 0–2)
WBC, UA: >30 /hpf — AB (ref 0–5)

## 2019-07-18 NOTE — Telephone Encounter (Signed)
Called pt, he states that his urine is much darker today than yesterday and he would like to drop off a urine. Advised pt that the urine sample he dropped of yesterday was sufficient for all the testing we need. Advised pt that urine was sent off for cytology and urine culture we will call him with results. Pt gave verbal understanding.

## 2019-07-19 ENCOUNTER — Encounter: Payer: Self-pay | Admitting: Urology

## 2019-07-21 ENCOUNTER — Ambulatory Visit: Payer: Medicare HMO

## 2019-07-21 ENCOUNTER — Telehealth: Payer: Self-pay

## 2019-07-21 ENCOUNTER — Other Ambulatory Visit: Payer: Self-pay | Admitting: Urology

## 2019-07-21 LAB — CULTURE, URINE COMPREHENSIVE

## 2019-07-21 MED ORDER — AMOXICILLIN 875 MG PO TABS: 875.0000 mg | ORAL_TABLET | Freq: Two times a day (BID) | ORAL | 0 refills | Status: DC

## 2019-07-21 NOTE — Telephone Encounter (Signed)
Patient notified

## 2019-07-21 NOTE — Telephone Encounter (Signed)
-----   Message from Abbie Sons, MD sent at 07/21/2019  8:12 AM EST ----- Urine culture was positive for bacteria which is the most likely cause of the appearance of his urine.  Antibiotic Rx was sent to pharmacy

## 2019-07-23 ENCOUNTER — Other Ambulatory Visit: Payer: Self-pay | Admitting: Urology

## 2019-07-25 ENCOUNTER — Ambulatory Visit: Payer: Medicare HMO

## 2019-07-28 ENCOUNTER — Telehealth: Payer: Self-pay | Admitting: Urology

## 2019-07-28 ENCOUNTER — Ambulatory Visit: Payer: Medicare HMO

## 2019-07-28 NOTE — Telephone Encounter (Signed)
Notified patient as instructed, patient pleased. Discussed follow-up appointments, patient agrees  

## 2019-07-28 NOTE — Telephone Encounter (Signed)
Urine cytology showed no malignant or suspicious cells and was consistent with inflammation/infection

## 2019-07-31 ENCOUNTER — Ambulatory Visit: Payer: Medicare HMO

## 2019-08-06 ENCOUNTER — Ambulatory Visit: Payer: Medicare HMO

## 2019-08-08 ENCOUNTER — Ambulatory Visit: Payer: Medicare HMO

## 2019-08-11 ENCOUNTER — Ambulatory Visit: Payer: Medicare HMO

## 2019-08-13 ENCOUNTER — Ambulatory Visit: Payer: Medicare HMO

## 2019-08-21 ENCOUNTER — Other Ambulatory Visit: Payer: Self-pay

## 2019-08-21 DIAGNOSIS — R3914 Feeling of incomplete bladder emptying: Secondary | ICD-10-CM

## 2019-08-21 DIAGNOSIS — N401 Enlarged prostate with lower urinary tract symptoms: Secondary | ICD-10-CM

## 2019-08-21 MED ORDER — FINASTERIDE 5 MG PO TABS: 5.0000 mg | ORAL_TABLET | Freq: Every day | ORAL | 3 refills | Status: DC

## 2019-08-21 MED ORDER — ALFUZOSIN HCL ER 10 MG PO TB24: 10.0000 mg | ORAL_TABLET | Freq: Every day | ORAL | 3 refills | Status: DC

## 2019-08-25 DIAGNOSIS — C4492 Squamous cell carcinoma of skin, unspecified: Secondary | ICD-10-CM

## 2019-08-25 HISTORY — DX: Squamous cell carcinoma of skin, unspecified: C44.92

## 2019-09-01 ENCOUNTER — Ambulatory Visit: Admission: RE | Admit: 2019-09-01 | Payer: Medicare HMO | Source: Ambulatory Visit

## 2019-09-03 ENCOUNTER — Inpatient Hospital Stay: Payer: Medicare HMO | Admitting: Internal Medicine

## 2019-09-10 ENCOUNTER — Ambulatory Visit
Admission: RE | Admit: 2019-09-10 | Discharge: 2019-09-10 | Disposition: A | Discharge status: Home / Self Care | Payer: Medicare HMO | Source: Ambulatory Visit | Attending: Internal Medicine | Admitting: Internal Medicine

## 2019-09-10 ENCOUNTER — Other Ambulatory Visit: Payer: Self-pay

## 2019-09-10 DIAGNOSIS — R911 Solitary pulmonary nodule: Secondary | ICD-10-CM | POA: Diagnosis present

## 2019-09-10 DIAGNOSIS — K668 Other specified disorders of peritoneum: Secondary | ICD-10-CM | POA: Diagnosis present

## 2019-09-10 DIAGNOSIS — K669 Disorder of peritoneum, unspecified: Secondary | ICD-10-CM

## 2019-09-10 LAB — POCT I-STAT CREATININE: Creatinine, Ser: 1 mg/dL (ref 0.61–1.24)

## 2019-09-10 IMAGING — CT CT ABD-PELV W/ CM
2 of 5 series · 12 of 46 positions shown, 14 images · IV contrast (omnipaque)
Comparison: [DATE] CT abdomen/pelvis. [DATE] chest
radiograph.

CLINICAL DATA: Intermittent gross hematuria for several months.
Follow-up right lung base pulmonary nodule on prior CT abdomen
study. Reported left upper extremity [OA] vaccination 1 week
prior.

EXAM:
CT CHEST, ABDOMEN, AND PELVIS WITH CONTRAST
TECHNIQUE: Multidetector CT imaging of the chest, abdomen and pelvis was
performed following the standard protocol during bolus
administration of intravenous contrast.
CONTRAST:  100mL OMNIPAQUE IOHEXOL 300 MG/ML  SOLN

[Series 2: axials cap 5.00 · axial · 0.70mm/px · z∈[-1495,-945]mm · 9 of 134 slices shown, 11 images]
[im 12/134  soft-tissue]
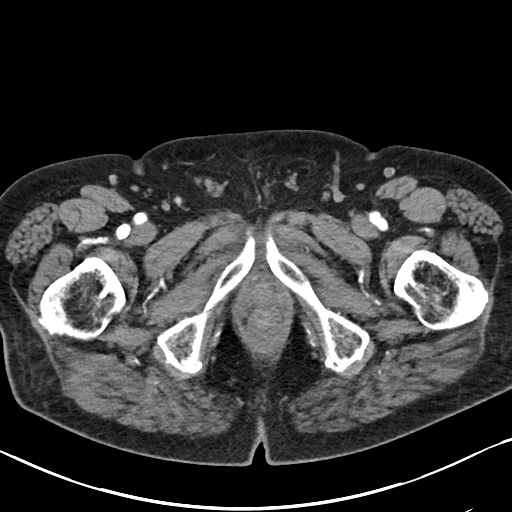
[im 12/134  bone]
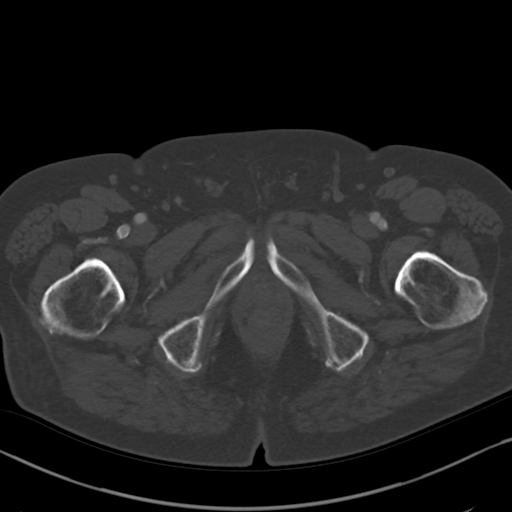
[im 23/134  soft-tissue]
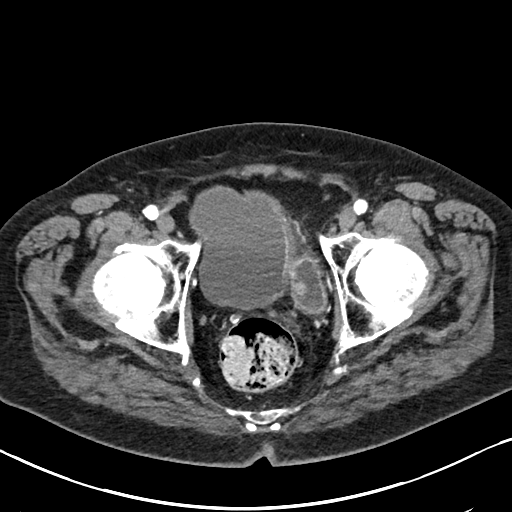
[im 45/134  soft-tissue]
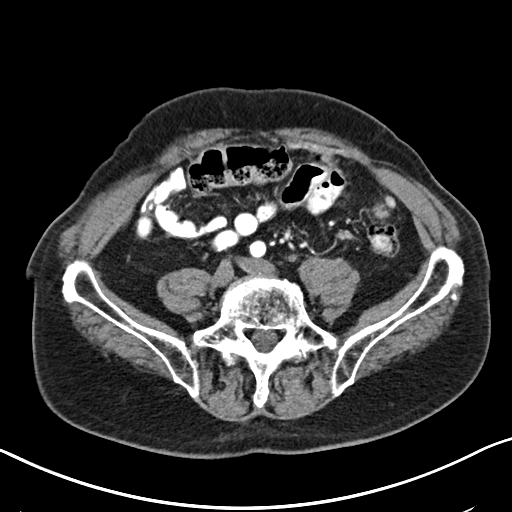
[im 56/134  soft-tissue]
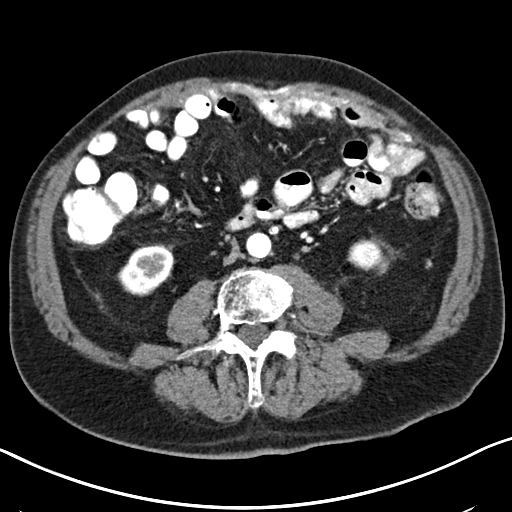
[im 67/134  soft-tissue]
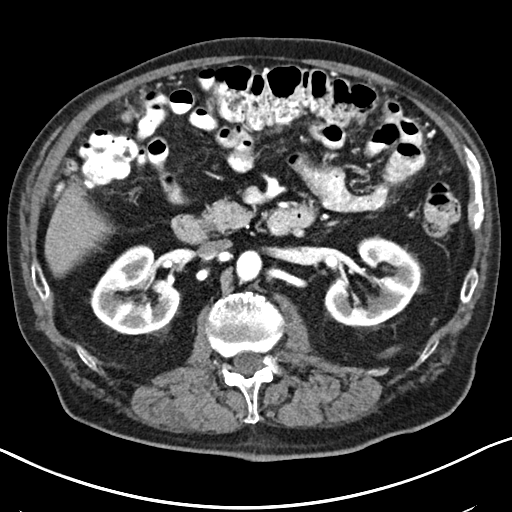
[im 78/134  soft-tissue]
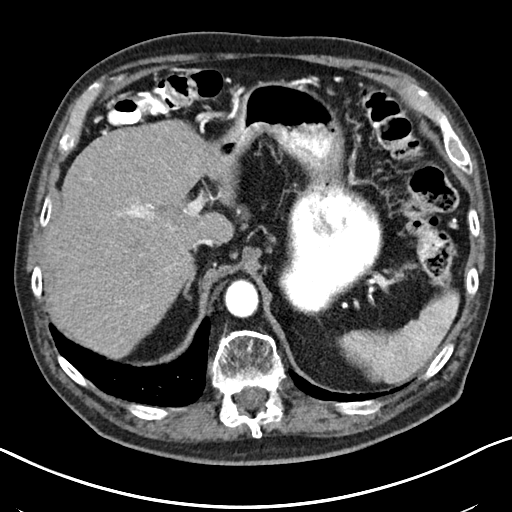
[im 89/134  soft-tissue]
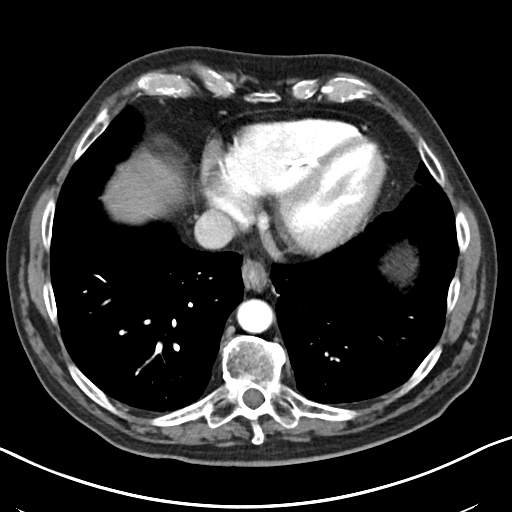
[im 111/134  soft-tissue]
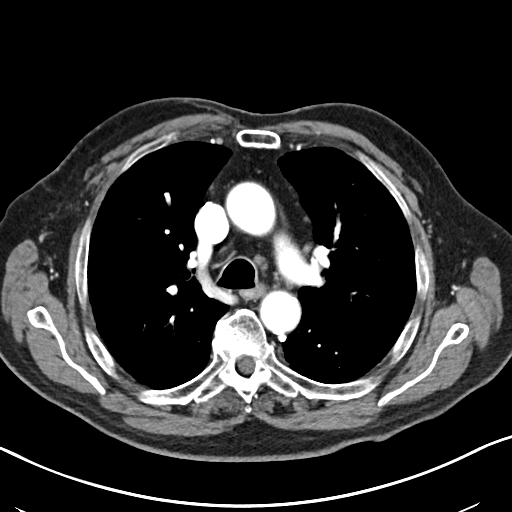
[im 122/134  soft-tissue]
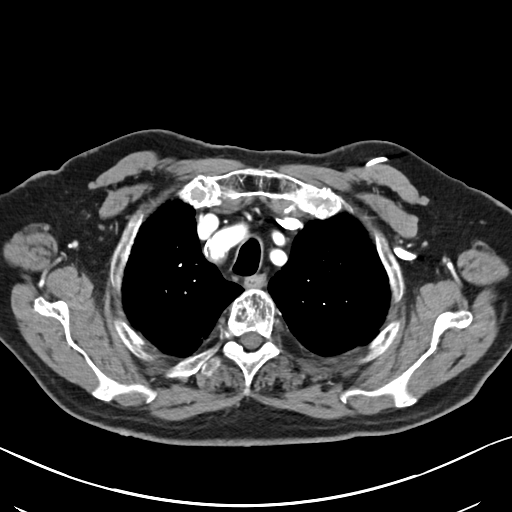
[im 122/134  bone]
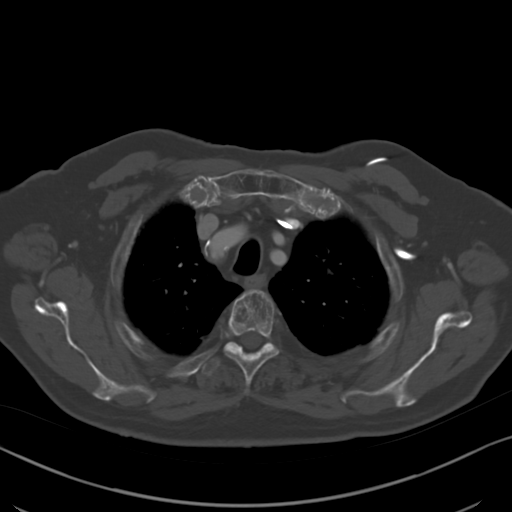

[Series 4: coronals cap 2.00 cor · coronal · 0.70mm/px · 3 of 156 slices shown]
[im 52/156  soft-tissue]
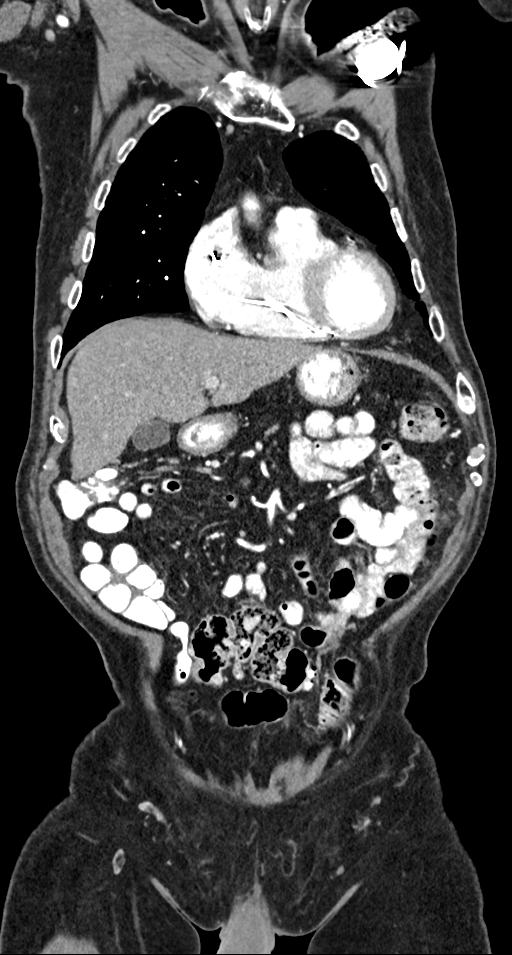
[im 69/156  soft-tissue]
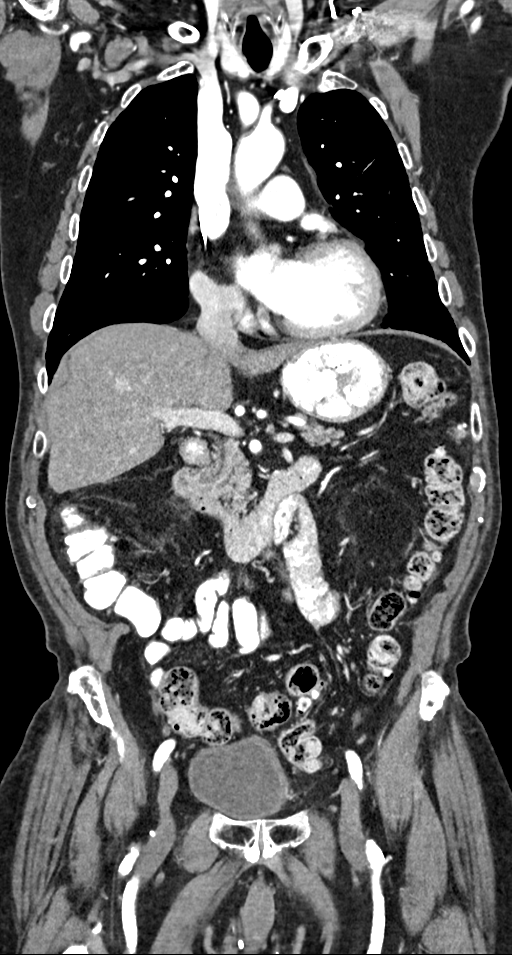
[im 87/156  soft-tissue]
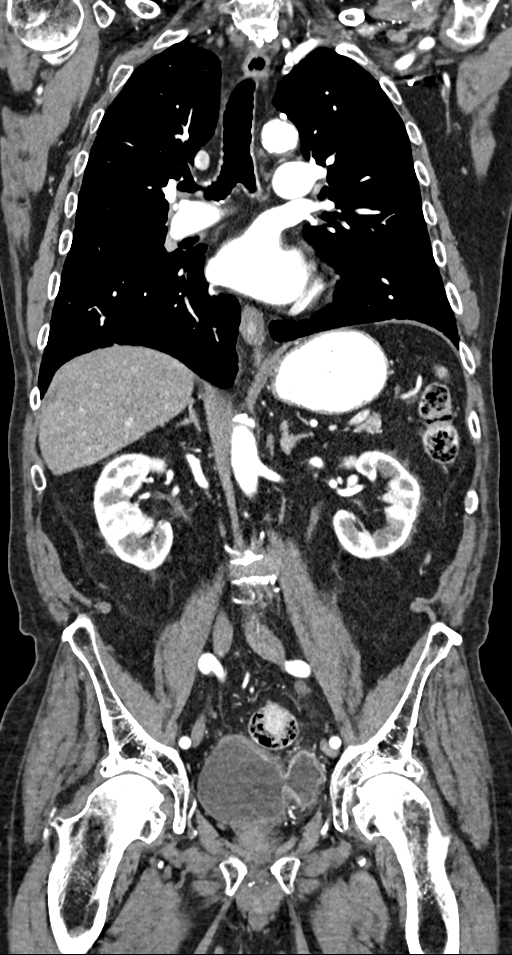

[12 of 46 positions shown; findings below may reference images not displayed]

FINDINGS: CT CHEST FINDINGS

Cardiovascular: Mild cardiomegaly. No significant pericardial
effusion/thickening. Two lead left subclavian pacemaker with lead
tips in the right atrium and right ventricular apex. Atherosclerotic
nonaneurysmal thoracic aorta. Normal caliber pulmonary arteries. No
central pulmonary emboli.

Mediastinum/Nodes: No discrete thyroid nodules. Unremarkable
esophagus. No pathologically enlarged axillary, mediastinal or hilar
lymph nodes.

Lungs/Pleura: No pneumothorax. No pleural effusion. Previously
described 4 mm posterior right lower lobe pulmonary nodule is stable
(series 3/image 111). No acute consolidative airspace disease, lung
masses or additional significant pulmonary nodules. Mild mosaic
attenuation throughout both lungs. Mild patchy subpleural
reticulation and ground-glass opacity in both lungs without
significant regions of bronchiectasis or honeycombing.

Musculoskeletal: No aggressive appearing focal osseous lesions.
Moderate T11 chronic vertebral compression fracture. Moderate
thoracic spondylosis.

CT ABDOMEN PELVIS FINDINGS

Hepatobiliary: Normal liver with no liver mass. Normal gallbladder
with no radiopaque cholelithiasis. No biliary ductal dilatation.

Pancreas: Normal, with no mass or duct dilation.

Spleen: Normal size. No mass.

Adrenals/Urinary Tract: Normal adrenals. Subcentimeter hypodense
upper right renal cortical lesion is too small to characterize and
unchanged, considered benign. Otherwise normal kidneys, no
hydronephrosis. New enhancing polypoid 1.1 x 1.0 cm bladder mass
(series 2/image 112) within the medial wall of a large left
posterolateral bladder diverticulum that measures 5.1 x 2.6 cm.
Mucosal hyperenhancement throughout the left bladder diverticulum
and left bladder wall is new. Diffuse bladder wall thickening is
unchanged.

Stomach/Bowel: Normal non-distended stomach. Normal caliber small
bowel with no small bowel wall thickening. Normal appendix. Oral
contrast transits to the colon. Moderate diffuse colonic
diverticulosis, with no large bowel wall thickening or acute
pericolonic fat stranding.

Vascular/Lymphatic: Atherosclerotic nonaneurysmal abdominal aorta.
Patent portal, splenic, hepatic and renal veins. No pathologically
enlarged lymph nodes in the abdomen or pelvis.

Reproductive: Normal size prostate with nonspecific internal
prostatic calcifications.

Other: No pneumoperitoneum, ascites or focal fluid collection.
Diffuse omental soft tissue thickening in the anterior peritoneal
cavity bilaterally up to 7 mm thickness, increased from 4 mm, with
new scattered hyperdensity throughout the regions of omental
thickening.

Musculoskeletal: No aggressive appearing focal osseous lesions.
Moderate thoracic spondylosis.
IMPRESSION: 1. New enhancing polypoid 1.1 x 1.0 cm bladder mass within the
medial wall of a large left posterolateral bladder diverticulum,
suspicious for primary bladder malignancy. New mucosal
hyperenhancement throughout the left bladder diverticulum and left
bladder wall, cannot exclude superficial tumor spread.
2. Increased diffuse omental soft tissue thickening in the anterior
peritoneal cavity, raising concern for peritoneal carcinomatosis.
3. No findings suspicious for metastatic disease in the chest.
Solitary 4 mm right lower lobe pulmonary nodule is stable and
probably benign, although warranting attention on future chest CT
follow-up.
4. Mild cardiomegaly.
5. Mild mosaic attenuation throughout both lungs, nonspecific, most
commonly due to air trapping from small airways disease.
6. Mild patchy subpleural reticulation and ground-glass opacity in
both lungs, cannot exclude a mild interstitial lung disease such as
nonspecific interstitial pneumonia (NSIP) or early usual
interstitial pneumonia (UIP). Consider a follow-up high-resolution
chest CT study in 6-12 months to assess temporal pattern stability,
as clinically warranted given comorbidities.
7. Moderate diffuse colonic diverticulosis.
8. Aortic Atherosclerosis ([OA]-[OA]).

## 2019-09-10 IMAGING — CT CT CHEST W/ CM
2 of 5 series · 11 of 36 positions shown, 13 images · IV contrast (omnipaque)
Comparison: [DATE] CT abdomen/pelvis. [DATE] chest
radiograph.

CLINICAL DATA: Intermittent gross hematuria for several months.
Follow-up right lung base pulmonary nodule on prior CT abdomen
study. Reported left upper extremity [OA] vaccination 1 week
prior.

EXAM:
CT CHEST, ABDOMEN, AND PELVIS WITH CONTRAST
TECHNIQUE: Multidetector CT imaging of the chest, abdomen and pelvis was
performed following the standard protocol during bolus
administration of intravenous contrast.
CONTRAST:  100mL OMNIPAQUE IOHEXOL 300 MG/ML  SOLN

[Series 2: axials cap 5.00 · axial · 0.70mm/px · z∈[-1485,-955]mm · 8 of 134 slices shown, 10 images]
[im 14/134  mediastinal]
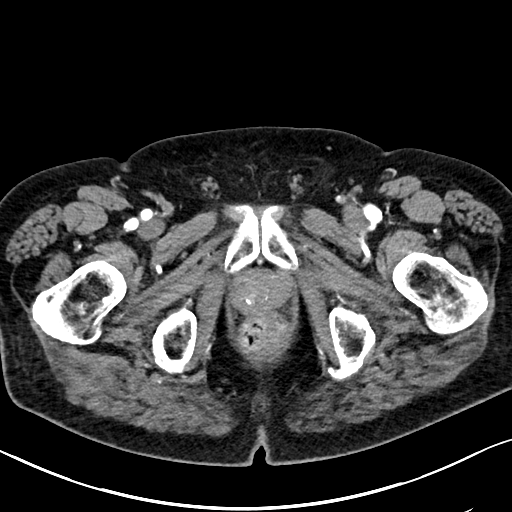
[im 14/134  lung]
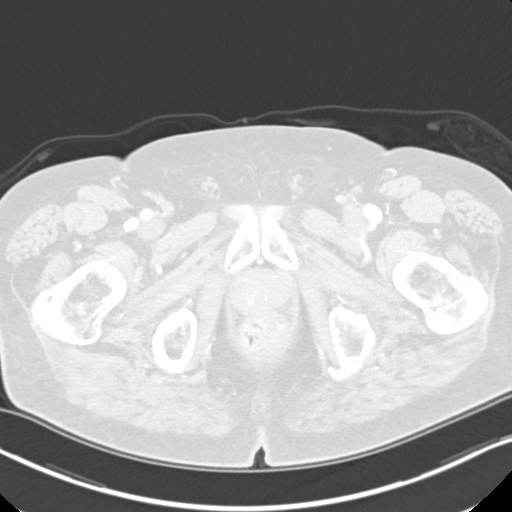
[im 27/134  lung]
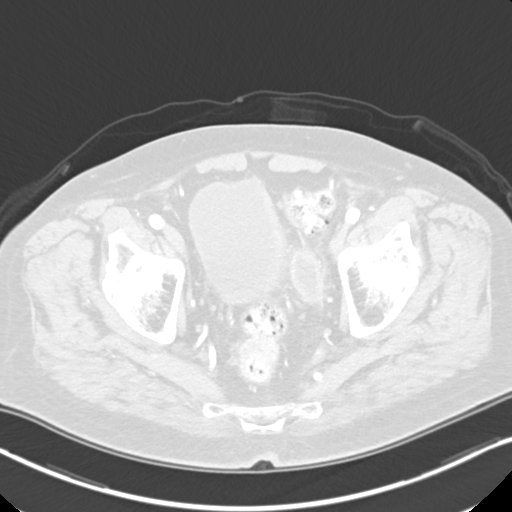
[im 40/134  lung]
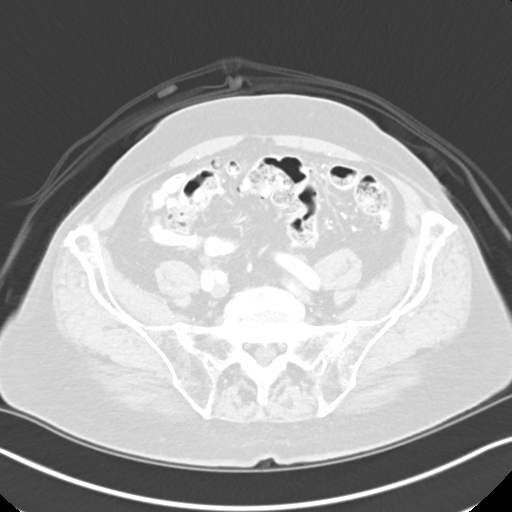
[im 54/134  lung]
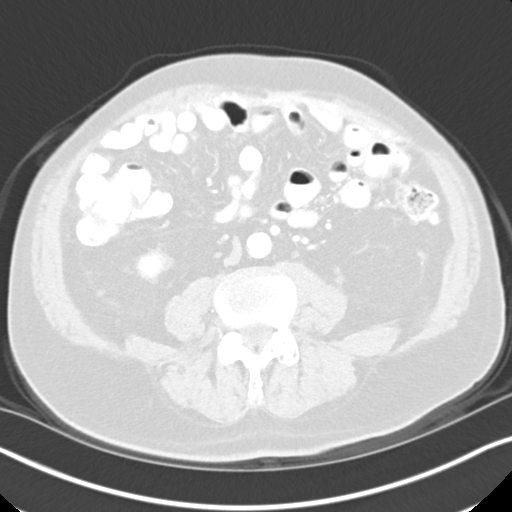
[im 80/134  mediastinal]
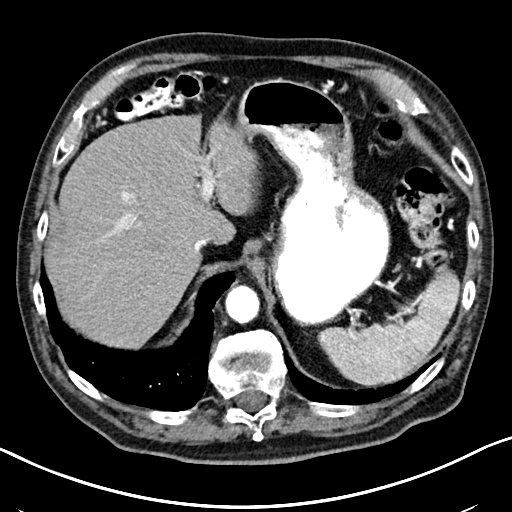
[im 80/134  lung]
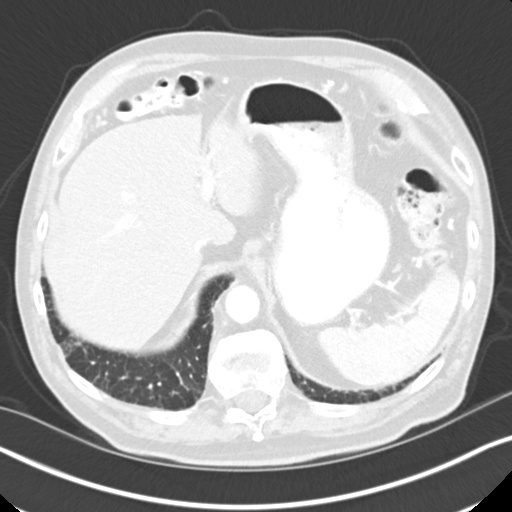
[im 94/134  lung]
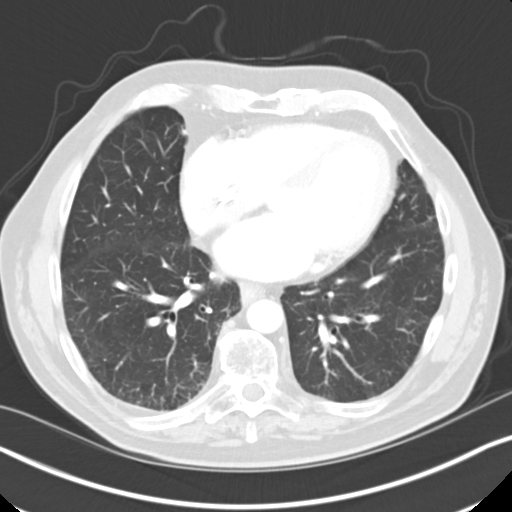
[im 107/134  lung]
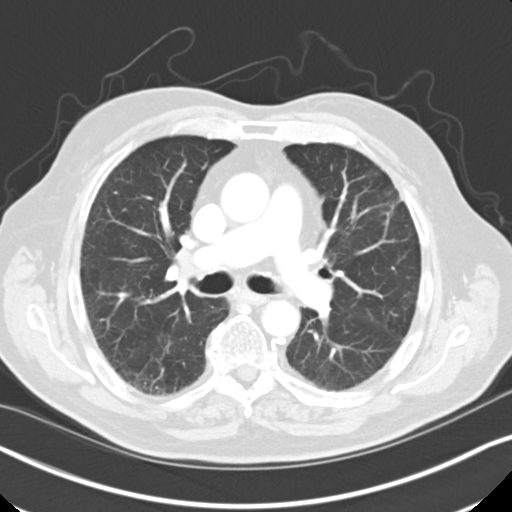
[im 120/134  lung]
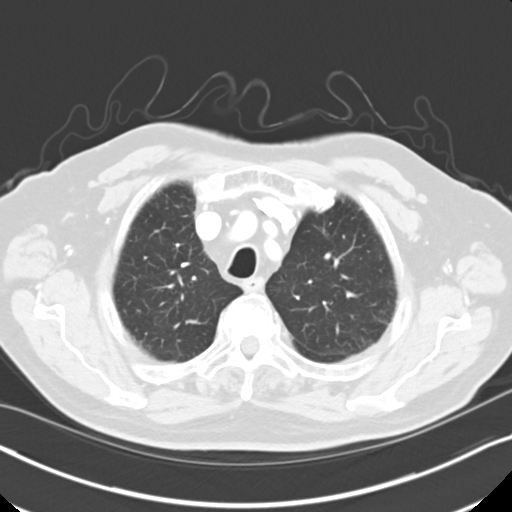

[Series 4: coronals cap 2.00 cor · coronal · 0.70mm/px · 3 of 156 slices shown]
[im 32/156  lung]
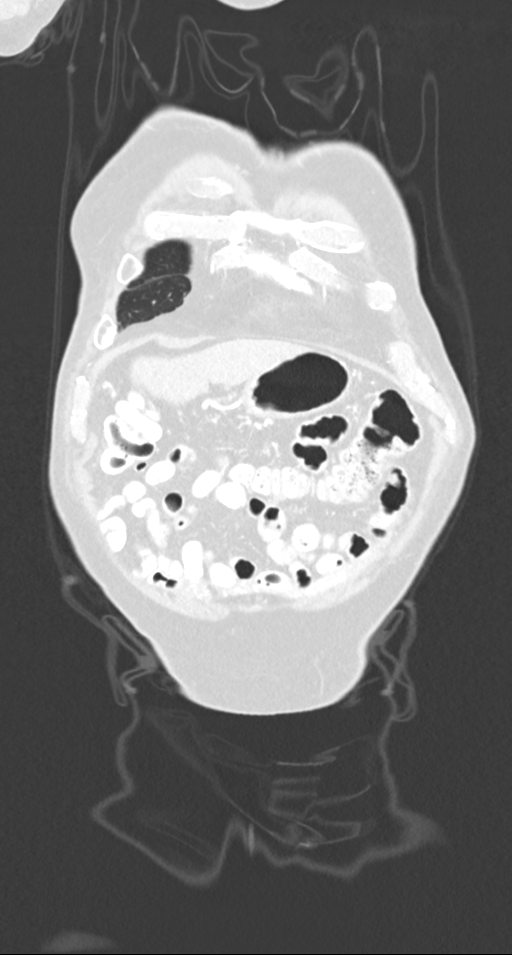
[im 63/156  lung]
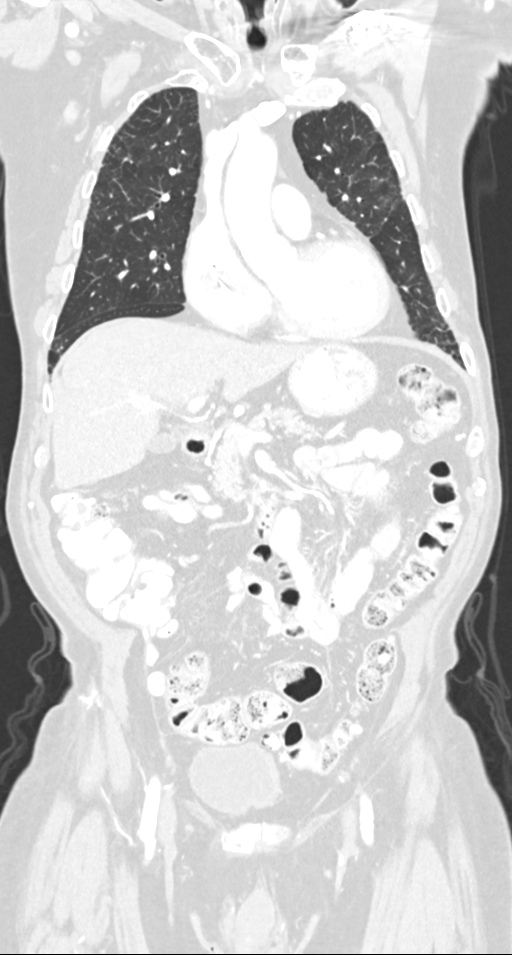
[im 94/156  lung]
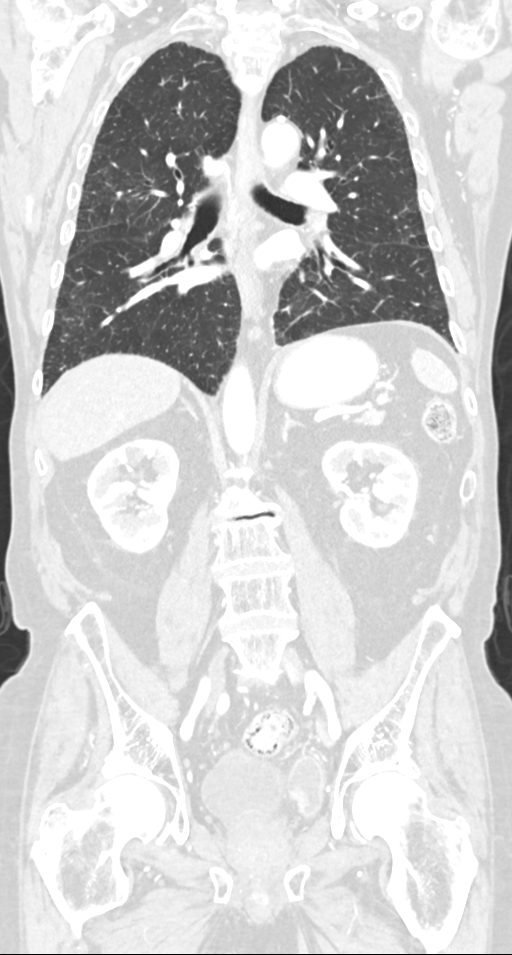

[11 of 36 positions shown; findings below may reference images not displayed]

FINDINGS: CT CHEST FINDINGS

Cardiovascular: Mild cardiomegaly. No significant pericardial
effusion/thickening. Two lead left subclavian pacemaker with lead
tips in the right atrium and right ventricular apex. Atherosclerotic
nonaneurysmal thoracic aorta. Normal caliber pulmonary arteries. No
central pulmonary emboli.

Mediastinum/Nodes: No discrete thyroid nodules. Unremarkable
esophagus. No pathologically enlarged axillary, mediastinal or hilar
lymph nodes.

Lungs/Pleura: No pneumothorax. No pleural effusion. Previously
described 4 mm posterior right lower lobe pulmonary nodule is stable
(series 3/image 111). No acute consolidative airspace disease, lung
masses or additional significant pulmonary nodules. Mild mosaic
attenuation throughout both lungs. Mild patchy subpleural
reticulation and ground-glass opacity in both lungs without
significant regions of bronchiectasis or honeycombing.

Musculoskeletal: No aggressive appearing focal osseous lesions.
Moderate T11 chronic vertebral compression fracture. Moderate
thoracic spondylosis.

CT ABDOMEN PELVIS FINDINGS

Hepatobiliary: Normal liver with no liver mass. Normal gallbladder
with no radiopaque cholelithiasis. No biliary ductal dilatation.

Pancreas: Normal, with no mass or duct dilation.

Spleen: Normal size. No mass.

Adrenals/Urinary Tract: Normal adrenals. Subcentimeter hypodense
upper right renal cortical lesion is too small to characterize and
unchanged, considered benign. Otherwise normal kidneys, no
hydronephrosis. New enhancing polypoid 1.1 x 1.0 cm bladder mass
(series 2/image 112) within the medial wall of a large left
posterolateral bladder diverticulum that measures 5.1 x 2.6 cm.
Mucosal hyperenhancement throughout the left bladder diverticulum
and left bladder wall is new. Diffuse bladder wall thickening is
unchanged.

Stomach/Bowel: Normal non-distended stomach. Normal caliber small
bowel with no small bowel wall thickening. Normal appendix. Oral
contrast transits to the colon. Moderate diffuse colonic
diverticulosis, with no large bowel wall thickening or acute
pericolonic fat stranding.

Vascular/Lymphatic: Atherosclerotic nonaneurysmal abdominal aorta.
Patent portal, splenic, hepatic and renal veins. No pathologically
enlarged lymph nodes in the abdomen or pelvis.

Reproductive: Normal size prostate with nonspecific internal
prostatic calcifications.

Other: No pneumoperitoneum, ascites or focal fluid collection.
Diffuse omental soft tissue thickening in the anterior peritoneal
cavity bilaterally up to 7 mm thickness, increased from 4 mm, with
new scattered hyperdensity throughout the regions of omental
thickening.

Musculoskeletal: No aggressive appearing focal osseous lesions.
Moderate thoracic spondylosis.
IMPRESSION: 1. New enhancing polypoid 1.1 x 1.0 cm bladder mass within the
medial wall of a large left posterolateral bladder diverticulum,
suspicious for primary bladder malignancy. New mucosal
hyperenhancement throughout the left bladder diverticulum and left
bladder wall, cannot exclude superficial tumor spread.
2. Increased diffuse omental soft tissue thickening in the anterior
peritoneal cavity, raising concern for peritoneal carcinomatosis.
3. No findings suspicious for metastatic disease in the chest.
Solitary 4 mm right lower lobe pulmonary nodule is stable and
probably benign, although warranting attention on future chest CT
follow-up.
4. Mild cardiomegaly.
5. Mild mosaic attenuation throughout both lungs, nonspecific, most
commonly due to air trapping from small airways disease.
6. Mild patchy subpleural reticulation and ground-glass opacity in
both lungs, cannot exclude a mild interstitial lung disease such as
nonspecific interstitial pneumonia (NSIP) or early usual
interstitial pneumonia (UIP). Consider a follow-up high-resolution
chest CT study in 6-12 months to assess temporal pattern stability,
as clinically warranted given comorbidities.
7. Moderate diffuse colonic diverticulosis.
8. Aortic Atherosclerosis ([OA]-[OA]).

## 2019-09-10 MED ORDER — IOHEXOL 300 MG/ML  SOLN
100.0000 mL | Freq: Once | INTRAMUSCULAR | Status: AC | PRN
  Administered 2019-09-10: 100 mL via INTRAVENOUS

## 2019-09-11 ENCOUNTER — Other Ambulatory Visit: Payer: Self-pay

## 2019-09-11 ENCOUNTER — Encounter: Payer: Self-pay | Admitting: Internal Medicine

## 2019-09-12 ENCOUNTER — Encounter: Payer: Self-pay | Admitting: Internal Medicine

## 2019-09-12 ENCOUNTER — Inpatient Hospital Stay: Payer: Medicare HMO | Attending: Internal Medicine | Admitting: Internal Medicine

## 2019-09-12 ENCOUNTER — Other Ambulatory Visit: Payer: Self-pay

## 2019-09-12 DIAGNOSIS — E785 Hyperlipidemia, unspecified: Secondary | ICD-10-CM | POA: Insufficient documentation

## 2019-09-12 DIAGNOSIS — N400 Enlarged prostate without lower urinary tract symptoms: Secondary | ICD-10-CM | POA: Insufficient documentation

## 2019-09-12 DIAGNOSIS — Z79899 Other long term (current) drug therapy: Secondary | ICD-10-CM | POA: Insufficient documentation

## 2019-09-12 DIAGNOSIS — K669 Disorder of peritoneum, unspecified: Secondary | ICD-10-CM

## 2019-09-12 DIAGNOSIS — Z7982 Long term (current) use of aspirin: Secondary | ICD-10-CM | POA: Insufficient documentation

## 2019-09-12 DIAGNOSIS — R5383 Other fatigue: Secondary | ICD-10-CM | POA: Diagnosis not present

## 2019-09-12 DIAGNOSIS — R5381 Other malaise: Secondary | ICD-10-CM | POA: Diagnosis not present

## 2019-09-12 DIAGNOSIS — Z7901 Long term (current) use of anticoagulants: Secondary | ICD-10-CM | POA: Insufficient documentation

## 2019-09-12 DIAGNOSIS — M8580 Other specified disorders of bone density and structure, unspecified site: Secondary | ICD-10-CM | POA: Insufficient documentation

## 2019-09-12 DIAGNOSIS — I10 Essential (primary) hypertension: Secondary | ICD-10-CM | POA: Diagnosis not present

## 2019-09-12 DIAGNOSIS — R3129 Other microscopic hematuria: Secondary | ICD-10-CM | POA: Diagnosis not present

## 2019-09-12 DIAGNOSIS — K668 Other specified disorders of peritoneum: Secondary | ICD-10-CM | POA: Insufficient documentation

## 2019-09-12 NOTE — Progress Notes (Signed)
[place] NOTE  Patient Care Team: Ezequiel Kayser, MD as PCP - General (Internal Medicine)  CHIEF COMPLAINTS/PURPOSE OF CONSULTATION: Peritoneal thickening  # NOV 2020- [incidental/hematuria work-up]-subtle peritoneal thickening/plaque-like lesions; March 2021-CT scan progressive omental/peritoneal thickening  #Microscopic hematuria- sec to BPH [cystoscopy/CT urogram negative; Dr.Stoiff]; March 2021 CT scan-new lesion in the bladder diverticulum  # on eliquis ? A.fib/pacemaker;   Oncology History   No history exists.     HISTORY OF PRESENTING ILLNESS:  Christopher [name] 86 y.o.  male with a history of peritoneal/omental thickening of unclear etiology is here for follow-up/review results of the CT scan.  Patient denies any worsening abdominal pain nausea vomiting diarrhea constipation.  Appetite is good.  No weight loss.  He denies any worsening blood in stools or black or stools denies blood in urine.     Review of Systems  Constitutional: Positive for malaise/fatigue. Negative for chills, diaphoresis, fever and weight loss.  HENT: Negative for nosebleeds and sore throat.   Eyes: Negative for double vision.  Respiratory: Negative for cough, hemoptysis, sputum production, shortness of breath and wheezing.   Cardiovascular: Negative for chest pain, palpitations, orthopnea and leg swelling.  Gastrointestinal: Negative for abdominal pain, blood in stool, constipation, diarrhea, heartburn, melena, nausea and vomiting.  Genitourinary: Negative for dysuria, frequency and urgency.  Musculoskeletal: Negative for back pain and joint pain.  Skin: Negative.  Negative for itching and rash.  Neurological: Negative for dizziness, tingling, focal weakness, weakness and headaches.  Endo/Heme/Allergies: Does not bruise/bleed easily.  Psychiatric/Behavioral: Negative for depression. The patient is not nervous/anxious and does not have insomnia.      MEDICAL HISTORY:  Past  Medical History:  Diagnosis Date  . BPH (benign prostatic hyperplasia)   . ED (erectile dysfunction)   . Gilbert disease   . HLD (hyperlipidemia)   . Hypertension   . Macular degeneration   . Osteopenia   . Sensorineural hearing loss     SURGICAL HISTORY: Past Surgical History:  Procedure Laterality Date  . CATARACT EXTRACTION    . HEMORRHOIDECTOMY WITH HEMORRHOID BANDING    . KNEE ARTHROSCOPY    . PACEMAKER INSERTION N/A 03/03/2015   Procedure: INSERTION DUAL LEAD PACEMAKER;  Surgeon: Isaias Cowman, MD;  Location: [facility] ORS;  Service: Cardiovascular;  Laterality: N/A;  . TONSILLECTOMY      SOCIAL HISTORY: Social History   Socioeconomic History  . Marital status: Widowed    Spouse name: Not on file  . Number of children: Not on file  . Years of education: Not on file  . Highest education level: Not on file  Occupational History  . Not on file  Tobacco Use  . Smoking status: Never Smoker  . Smokeless tobacco: Never Used  Substance and Sexual Activity  . Alcohol use: Yes  . Drug use: No  . Sexual activity: Yes    Birth control/protection: None  Other Topics Concern  . Not on file  Social History Narrative   Retd. Civil engineer, contracting; [place]; self. Never smoked; alcohol- glass wine/drink every night.     Social Determinants of Health   Financial Resource Strain:   . Difficulty of Paying Living Expenses: Not on file  Food Insecurity:   . Worried About Charity fundraiser in the Last Year: Not on file  . Ran Out of Food in the Last Year: Not on file  Transportation Needs:   . Lack of Transportation (Medical): Not on file  . Lack of Transportation (Non-Medical): Not  on file  Physical Activity:   . Days of Exercise per Week: Not on file  . Minutes of Exercise per Session: Not on file  Stress:   . Feeling of Stress : Not on file  Social Connections:   . Frequency of Communication with Friends and Family: Not on file  . Frequency of Social Gatherings with  Friends and Family: Not on file  . Attends Religious Services: Not on file  . Active Member of Clubs or Organizations: Not on file  . Attends Archivist Meetings: Not on file  . Marital Status: Not on file  Intimate Partner Violence:   . Fear of Current or Ex-Partner: Not on file  . Emotionally Abused: Not on file  . Physically Abused: Not on file  . Sexually Abused: Not on file    FAMILY HISTORY: Family History  Problem Relation Age of Onset  . Diabetes Mother   . Lung cancer Father   . Cancer Paternal Uncle   . Prostate cancer Neg Hx   . Bladder Cancer Neg Hx   . Kidney cancer Neg Hx     ALLERGIES:  is allergic to atenolol.  MEDICATIONS:  Current Outpatient Medications  Medication Sig Dispense Refill  . acetaminophen (TYLENOL) 500 MG tablet Take by mouth.    Marland Kitchen alfuzosin (UROXATRAL) 10 MG 24 hr tablet Take 1 tablet (10 mg total) by mouth daily with breakfast. 90 tablet 3  . amoxicillin (AMOXIL) 875 MG tablet Take 1 tablet (875 mg total) by mouth every 12 (twelve) hours. 14 tablet 0  . apixaban (ELIQUIS) 5 MG TABS tablet Take by mouth.    Marland Kitchen aspirin EC 81 MG tablet Take by mouth.    Marland Kitchen atorvastatin (LIPITOR) 10 MG tablet Take 1 tablet by mouth daily.    . Cholecalciferol (VITAMIN D3) 50 MCG (2000 UT) capsule Take 1 capsule by mouth daily.     . finasteride (PROSCAR) 5 MG tablet Take 1 tablet (5 mg total) by mouth daily. 90 tablet 3  . metoprolol succinate (TOPROL-XL) 50 MG 24 hr tablet     . metoprolol tartrate (LOPRESSOR) 25 MG tablet Take 75 mg by mouth daily.    . Multiple Vitamins-Minerals (OCUVITE PRESERVISION PO) Take 1 tablet by mouth 2 (two) times daily.    Marland Kitchen neomycin-polymyxin b-dexamethasone (MAXITROL) 3.5-10000-0.1 SUSP     . Omega-3 Fatty Acids (FISH OIL) 1000 MG CAPS Take 1 tablet by mouth 3 (three) times daily.      No current facility-administered medications for this visit.      Marland Kitchen  PHYSICAL EXAMINATION: ECOG PERFORMANCE STATUS: 0 -  Asymptomatic  Vitals:   09/11/19 1500  BP: (!) 141/81  Pulse: 83  Temp: (!) 97.2 F (36.2 C)   Filed Weights   09/11/19 1500  Weight: 195 lb (88.5 kg)    Physical Exam  Constitutional: He is oriented to person, place, and time and well-developed, well-nourished, and in no distress.  HENT:  Head: Normocephalic and atraumatic.  Mouth/Throat: Oropharynx is clear and moist. No oropharyngeal exudate.  Eyes: Pupils are equal, round, and reactive to light.  Cardiovascular: Normal rate and regular rhythm.  Pulmonary/Chest: No respiratory distress. He has no wheezes.  Abdominal: Soft. Bowel sounds are normal. He exhibits no distension and no mass. There is no abdominal tenderness. There is no rebound and no guarding.  Musculoskeletal:        General: No tenderness or edema. Normal range of motion.     Cervical back: Normal  range of motion and neck supple.  Neurological: He is alert and oriented to person, place, and time.  Skin: Skin is warm.  Psychiatric: Affect normal.     LABORATORY DATA:  I have reviewed the data as listed Lab Results  Component Value Date   WBC 4.9 05/18/2018   HGB 13.1 05/18/2018   HCT 40.6 05/18/2018   MCV 98.1 05/18/2018   PLT 218 05/18/2018   Recent Labs    09/10/19 1400  CREATININE 1.00    RADIOGRAPHIC STUDIES: I have personally reviewed the radiological images as listed and agreed with the findings in the report. CT Chest W Contrast  Result Date: 09/10/2019 CLINICAL DATA:  Intermittent gross hematuria for several months. Follow-up right lung base pulmonary nodule on prior CT abdomen study. Reported left upper extremity COVID-19 vaccination 1 week prior. EXAM: CT CHEST, ABDOMEN, AND PELVIS WITH CONTRAST TECHNIQUE: Multidetector CT imaging of the chest, abdomen and pelvis was performed following the standard protocol during bolus administration of intravenous contrast. CONTRAST:  155mL OMNIPAQUE IOHEXOL 300 MG/ML  SOLN COMPARISON:  05/30/2019 CT  abdomen/pelvis. 03/03/2015 chest radiograph. FINDINGS: CT CHEST FINDINGS Cardiovascular: Mild cardiomegaly. No significant pericardial effusion/thickening. Two lead left subclavian pacemaker with lead tips in the right atrium and right ventricular apex. Atherosclerotic nonaneurysmal thoracic aorta. Normal caliber pulmonary arteries. No central pulmonary emboli. Mediastinum/Nodes: No discrete thyroid nodules. Unremarkable esophagus. No pathologically enlarged axillary, mediastinal or hilar lymph nodes. Lungs/Pleura: No pneumothorax. No pleural effusion. Previously described 4 mm posterior right lower lobe pulmonary nodule is stable (series 3/image 111). No acute consolidative airspace disease, lung masses or additional significant pulmonary nodules. Mild mosaic attenuation throughout both lungs. Mild patchy subpleural reticulation and ground-glass opacity in both lungs without significant regions of bronchiectasis or honeycombing. Musculoskeletal: No aggressive appearing focal osseous lesions. Moderate T11 chronic vertebral compression fracture. Moderate thoracic spondylosis. CT ABDOMEN PELVIS FINDINGS Hepatobiliary: Normal liver with no liver mass. Normal gallbladder with no radiopaque cholelithiasis. No biliary ductal dilatation. Pancreas: Normal, with no mass or duct dilation. Spleen: Normal size. No mass. Adrenals/Urinary Tract: Normal adrenals. Subcentimeter hypodense upper right renal cortical lesion is too small to characterize and unchanged, considered benign. Otherwise normal kidneys, no hydronephrosis. New enhancing polypoid 1.1 x 1.0 cm bladder mass (series 2/image 112) within the medial wall of a large left posterolateral bladder diverticulum that measures 5.1 x 2.6 cm. Mucosal hyperenhancement throughout the left bladder diverticulum and left bladder wall is new. Diffuse bladder wall thickening is unchanged. Stomach/Bowel: Normal non-distended stomach. Normal caliber small bowel with no small bowel wall  thickening. Normal appendix. Oral contrast transits to the colon. Moderate diffuse colonic diverticulosis, with no large bowel wall thickening or acute pericolonic fat stranding. Vascular/Lymphatic: Atherosclerotic nonaneurysmal abdominal aorta. Patent portal, splenic, hepatic and renal veins. No pathologically enlarged lymph nodes in the abdomen or pelvis. Reproductive: Normal size prostate with nonspecific internal prostatic calcifications. Other: No pneumoperitoneum, ascites or focal fluid collection. Diffuse omental soft tissue thickening in the anterior peritoneal cavity bilaterally up to 7 mm thickness, increased from 4 mm, with new scattered hyperdensity throughout the regions of omental thickening. Musculoskeletal: No aggressive appearing focal osseous lesions. Moderate thoracic spondylosis. IMPRESSION: 1. New enhancing polypoid 1.1 x 1.0 cm bladder mass within the medial wall of a large left posterolateral bladder diverticulum, suspicious for primary bladder malignancy. New mucosal hyperenhancement throughout the left bladder diverticulum and left bladder wall, cannot exclude superficial tumor spread. 2. Increased diffuse omental soft tissue thickening in the anterior peritoneal cavity, raising concern  for peritoneal carcinomatosis. 3. No findings suspicious for metastatic disease in the chest. Solitary 4 mm right lower lobe pulmonary nodule is stable and probably benign, although warranting attention on future chest CT follow-up. 4. Mild cardiomegaly. 5. Mild mosaic attenuation throughout both lungs, nonspecific, most commonly due to air trapping from small airways disease. 6. Mild patchy subpleural reticulation and ground-glass opacity in both lungs, cannot exclude a mild interstitial lung disease such as nonspecific interstitial pneumonia (NSIP) or early usual interstitial pneumonia (UIP). Consider a follow-up high-resolution chest CT study in 6-12 months to assess temporal pattern stability, as  clinically warranted given comorbidities. 7. Moderate diffuse colonic diverticulosis. 8. Aortic Atherosclerosis (ICD10-I70.0). Electronically Signed   By: Ilona Sorrel M.D.   On: 09/10/2019 15:00   CT ABDOMEN PELVIS W CONTRAST  Result Date: 09/10/2019 CLINICAL DATA:  Intermittent gross hematuria for several months. Follow-up right lung base pulmonary nodule on prior CT abdomen study. Reported left upper extremity COVID-19 vaccination 1 week prior. EXAM: CT CHEST, ABDOMEN, AND PELVIS WITH CONTRAST TECHNIQUE: Multidetector CT imaging of the chest, abdomen and pelvis was performed following the standard protocol during bolus administration of intravenous contrast. CONTRAST:  181mL OMNIPAQUE IOHEXOL 300 MG/ML  SOLN COMPARISON:  05/30/2019 CT abdomen/pelvis. 03/03/2015 chest radiograph. FINDINGS: CT CHEST FINDINGS Cardiovascular: Mild cardiomegaly. No significant pericardial effusion/thickening. Two lead left subclavian pacemaker with lead tips in the right atrium and right ventricular apex. Atherosclerotic nonaneurysmal thoracic aorta. Normal caliber pulmonary arteries. No central pulmonary emboli. Mediastinum/Nodes: No discrete thyroid nodules. Unremarkable esophagus. No pathologically enlarged axillary, mediastinal or hilar lymph nodes. Lungs/Pleura: No pneumothorax. No pleural effusion. Previously described 4 mm posterior right lower lobe pulmonary nodule is stable (series 3/image 111). No acute consolidative airspace disease, lung masses or additional significant pulmonary nodules. Mild mosaic attenuation throughout both lungs. Mild patchy subpleural reticulation and ground-glass opacity in both lungs without significant regions of bronchiectasis or honeycombing. Musculoskeletal: No aggressive appearing focal osseous lesions. Moderate T11 chronic vertebral compression fracture. Moderate thoracic spondylosis. CT ABDOMEN PELVIS FINDINGS Hepatobiliary: Normal liver with no liver mass. Normal gallbladder with no  radiopaque cholelithiasis. No biliary ductal dilatation. Pancreas: Normal, with no mass or duct dilation. Spleen: Normal size. No mass. Adrenals/Urinary Tract: Normal adrenals. Subcentimeter hypodense upper right renal cortical lesion is too small to characterize and unchanged, considered benign. Otherwise normal kidneys, no hydronephrosis. New enhancing polypoid 1.1 x 1.0 cm bladder mass (series 2/image 112) within the medial wall of a large left posterolateral bladder diverticulum that measures 5.1 x 2.6 cm. Mucosal hyperenhancement throughout the left bladder diverticulum and left bladder wall is new. Diffuse bladder wall thickening is unchanged. Stomach/Bowel: Normal non-distended stomach. Normal caliber small bowel with no small bowel wall thickening. Normal appendix. Oral contrast transits to the colon. Moderate diffuse colonic diverticulosis, with no large bowel wall thickening or acute pericolonic fat stranding. Vascular/Lymphatic: Atherosclerotic nonaneurysmal abdominal aorta. Patent portal, splenic, hepatic and renal veins. No pathologically enlarged lymph nodes in the abdomen or pelvis. Reproductive: Normal size prostate with nonspecific internal prostatic calcifications. Other: No pneumoperitoneum, ascites or focal fluid collection. Diffuse omental soft tissue thickening in the anterior peritoneal cavity bilaterally up to 7 mm thickness, increased from 4 mm, with new scattered hyperdensity throughout the regions of omental thickening. Musculoskeletal: No aggressive appearing focal osseous lesions. Moderate thoracic spondylosis. IMPRESSION: 1. New enhancing polypoid 1.1 x 1.0 cm bladder mass within the medial wall of a large left posterolateral bladder diverticulum, suspicious for primary bladder malignancy. New mucosal hyperenhancement throughout the left  bladder diverticulum and left bladder wall, cannot exclude superficial tumor spread. 2. Increased diffuse omental soft tissue thickening in the  anterior peritoneal cavity, raising concern for peritoneal carcinomatosis. 3. No findings suspicious for metastatic disease in the chest. Solitary 4 mm right lower lobe pulmonary nodule is stable and probably benign, although warranting attention on future chest CT follow-up. 4. Mild cardiomegaly. 5. Mild mosaic attenuation throughout both lungs, nonspecific, most commonly due to air trapping from small airways disease. 6. Mild patchy subpleural reticulation and ground-glass opacity in both lungs, cannot exclude a mild interstitial lung disease such as nonspecific interstitial pneumonia (NSIP) or early usual interstitial pneumonia (UIP). Consider a follow-up high-resolution chest CT study in 6-12 months to assess temporal pattern stability, as clinically warranted given comorbidities. 7. Moderate diffuse colonic diverticulosis. 8. Aortic Atherosclerosis (ICD10-I70.0). Electronically Signed   By: Ilona Sorrel M.D.   On: 09/10/2019 15:00    ASSESSMENT & PLAN:   Lesion of peritoneum #Omental/peritoneal thickening-unclear etiology; follow-up CT scan March-shows progressive omental/peritoneal thickening.  Otherwise still no primary noted on imaging.  Patient continues to be symptomatic.  #However given the concerning for malignancy-discussed regarding the biopsy.  Patient interested.  Will review again the tumor conference.  Will inform patient.  # 2mm lung nodule-low risk [never smoker]-repeat scan stable.  # microscopic hematuria sec to BPH-[dr.Stoiff]; March 2021 CT scan shows-approximately 1 cm growth in the bladder diverticulum.  This needs attention of urology.  Will inform Dr. Bernardo Heater  # DISPOSITION: We will call regarding tumor conference discussion # follow up TBD- Dr.B  # I reviewed the blood work- with the patient in detail; also reviewed the imaging independently [as summarized above]; and with the patient in detail.     All questions were answered. The patient knows to call the clinic  with any problems, questions or concerns.    Cammie Sickle, MD 09/12/2019 12:54 PM

## 2019-09-12 NOTE — Assessment & Plan Note (Addendum)
#  Omental/peritoneal thickening-unclear etiology; follow-up CT scan March-shows progressive omental/peritoneal thickening.  Otherwise still no primary noted on imaging.  Patient continues to be symptomatic.  #However given the concerning for malignancy-discussed regarding the biopsy.  Patient interested.  Will review again the tumor conference.  Will inform patient.  # 73mm lung nodule-low risk [never smoker]-repeat scan stable.  # microscopic hematuria sec to BPH-[dr.Stoiff]; March 2021 CT scan shows-approximately 1 cm growth in the bladder diverticulum.  This needs attention of urology.  Will inform Dr. Bernardo Heater  # DISPOSITION: We will call regarding tumor conference discussion # follow up TBD- Dr.B  # I reviewed the blood work- with the patient in detail; also reviewed the imaging independently [as summarized above]; and with the patient in detail.

## 2019-09-17 ENCOUNTER — Encounter: Payer: Self-pay | Admitting: Urology

## 2019-09-17 ENCOUNTER — Ambulatory Visit: Payer: Medicare HMO | Admitting: Urology

## 2019-09-17 ENCOUNTER — Other Ambulatory Visit: Payer: Self-pay

## 2019-09-17 VITALS — BP 87/52 | HR 73 | Ht 71.0 in | Wt 195.0 lb

## 2019-09-17 DIAGNOSIS — R310 Gross hematuria: Secondary | ICD-10-CM

## 2019-09-17 DIAGNOSIS — D494 Neoplasm of unspecified behavior of bladder: Secondary | ICD-10-CM

## 2019-09-17 NOTE — Progress Notes (Signed)
09/17/2019 1:44 PM   Christopher [name] Apr 04, 1928 [id number]  Referring provider: Ezequiel Kayser, MD [place] [facility] [place],  [place] [address]  Chief Complaint  Patient presents with  . Hematuria    Urologic history: 1.  BPH with lower urinary tract symptoms -Initially saw Dr. Pilar Jarvis in 2017 -Was on finasteride/tamsulosin and switch to alfuzosin secondary to orthostatic symptoms  2.  Hematuria -Noted to have microhematuria 04/2019 -CTU normal upper tracts; incidental peritoneal thickening anterior abdominal wall noted; referred to oncology -Cystoscopy BPH with hypervascularity -Urine cytology negative  HPI: 86 y.o. male presents for follow-up  -Had episode of total gross painless hematuria last week which lasted 2 days then resolved -Remains on Eliquis -No flank, abdominal or pelvic pain -No dysuria -CT abdomen pelvis with contrast ordered Dr. Rogue Bussing last week showed progressive omental/peritoneal thickening.  Was presented at tumor conference to discuss biopsy -Incidentally found to have a 1 cm polypoid enhancing bladder mass in the medial wall of his left bladder diverticulum and mucosal hyperenhancement throughout the bladder diverticulum and left bladder wall which was new compared to prior CT   PMH: Past Medical History:  Diagnosis Date  . BPH (benign prostatic hyperplasia)   . ED (erectile dysfunction)   . Gilbert disease   . HLD (hyperlipidemia)   . Hypertension   . Macular degeneration   . Osteopenia   . Sensorineural hearing loss     Surgical History: Past Surgical History:  Procedure Laterality Date  . CATARACT EXTRACTION    . HEMORRHOIDECTOMY WITH HEMORRHOID BANDING    . KNEE ARTHROSCOPY    . PACEMAKER INSERTION N/A 03/03/2015   Procedure: INSERTION DUAL LEAD PACEMAKER;  Surgeon: Isaias Cowman, MD;  Location: [facility] ORS;  Service: Cardiovascular;  Laterality: N/A;  . TONSILLECTOMY      Home Medications:  Allergies  as of 09/17/2019      Reactions   Atenolol    Other reaction(s): Unknown      Medication List       Accurate as of September 17, 2019  1:44 PM. If you have any questions, ask your nurse or doctor.        acetaminophen 500 MG tablet Commonly known as: TYLENOL Take by mouth.   alfuzosin 10 MG 24 hr tablet Commonly known as: UROXATRAL Take 1 tablet (10 mg total) by mouth daily with breakfast.   amoxicillin 875 MG tablet Commonly known as: AMOXIL Take 1 tablet (875 mg total) by mouth every 12 (twelve) hours.   apixaban 5 MG Tabs tablet Commonly known as: ELIQUIS Take by mouth.   aspirin EC 81 MG tablet Take by mouth.   atorvastatin 10 MG tablet Commonly known as: LIPITOR Take 1 tablet by mouth daily.   finasteride 5 MG tablet Commonly known as: PROSCAR Take 1 tablet (5 mg total) by mouth daily.   Fish Oil 1000 MG Caps Take 1 tablet by mouth 3 (three) times daily.   metoprolol succinate 50 MG 24 hr tablet Commonly known as: TOPROL-XL   metoprolol tartrate 25 MG tablet Commonly known as: LOPRESSOR Take 75 mg by mouth daily.   neomycin-polymyxin b-dexamethasone 3.5-10000-0.1 Susp Commonly known as: MAXITROL   OCUVITE PRESERVISION PO Take 1 tablet by mouth 2 (two) times daily.   Vitamin D3 50 MCG (2000 UT) capsule Take 1 capsule by mouth daily.       Allergies:  Allergies  Allergen Reactions  . Atenolol     Other reaction(s): Unknown  Family History: Family History  Problem Relation Age of Onset  . Diabetes Mother   . Lung cancer Father   . Cancer Paternal Uncle   . Prostate cancer Neg Hx   . Bladder Cancer Neg Hx   . Kidney cancer Neg Hx     Social History:  reports that he has never smoked. He has never used smokeless tobacco. He reports current alcohol use. He reports that he does not use drugs.   Physical Exam: BP (!) 87/52   Pulse 73   Ht 5\' 11"  (1.803 m)   Wt 195 lb (88.5 kg)   BMI 27.20 kg/m   Constitutional:  [name] and oriented,  No acute distress. HEENT: [place] AT, moist mucus membranes.  Trachea midline, no masses. Cardiovascular: No clubbing, cyanosis, or edema.  RRR Respiratory: Normal respiratory effort, no increased work of breathing.  Clear GI: Abdomen is soft, nontender, nondistended, no abdominal masses GU: No CVA tenderness Lymph: No cervical or inguinal lymphadenopathy. Skin: No rashes, bruises or suspicious lesions. Neurologic: Grossly intact, no focal deficits, moving all 4 extremities. Psychiatric: Normal mood and affect.   Assessment & Plan:    - Gross hematuria Recent CT with 1 cm enhancing polypoid mass within bladder diverticulum.  Options of scheduling office cystoscopy versus cystoscopy under anesthesia discussed.  He has elected to proceed with cystoscopy under anesthesia and possible TURBT/bladder biopsy.  The procedure was discussed in detail.  Will need cardiology clearance to hold Eliquis  The indications and nature of the planned procedure were discussed as well as the potential benefits and expected outcome.  Alternatives have been discussed.  Potential complications were discussed including but not limited to bleeding, infection and bladder perforation especially within a bladder diverticulum. The postoperative care and followup was reviewed.  The patient was informed that he may need additional treatment along with periodic surveillance cystoscopy.  All of his questions were answered and he desires to proceed.  - BPH with lower urinary tract symptoms Stable on finasteride/alfuzosin    Abbie Sons, MD  [facility] [address] [place], [place] [address] [phone]

## 2019-09-17 NOTE — H&P (View-Only) (Signed)
09/17/2019 1:44 PM   [name] 03/29/1928 [id number]  Referring provider: Ezequiel Kayser, MD [facility] [place],  [place] [address]  Chief Complaint  Patient presents with  . Hematuria    Urologic history: 1.  BPH with lower urinary tract symptoms -Initially saw Dr. Pilar Jarvis in 2017 -Was on finasteride/tamsulosin and switch to alfuzosin secondary to orthostatic symptoms  2.  Hematuria -Noted to have microhematuria 04/2019 -CTU normal upper tracts; incidental peritoneal thickening anterior abdominal wall noted; referred to oncology -Cystoscopy BPH with hypervascularity -Urine cytology negative  HPI: 86 y.o. male presents for follow-up  -Had episode of total gross painless hematuria last week which lasted 2 days then resolved -Remains on Eliquis -No flank, abdominal or pelvic pain -No dysuria -CT abdomen pelvis with contrast ordered Dr. Rogue Bussing last week showed progressive omental/peritoneal thickening.  Was presented at tumor conference to discuss biopsy -Incidentally found to have a 1 cm polypoid enhancing bladder mass in the medial wall of his left bladder diverticulum and mucosal hyperenhancement throughout the bladder diverticulum and left bladder wall which was new compared to prior CT   PMH: Past Medical History:  Diagnosis Date  . BPH (benign prostatic hyperplasia)   . ED (erectile dysfunction)   . Gilbert disease   . HLD (hyperlipidemia)   . Hypertension   . Macular degeneration   . Osteopenia   . Sensorineural hearing loss     Surgical History: Past Surgical History:  Procedure Laterality Date  . CATARACT EXTRACTION    . HEMORRHOIDECTOMY WITH HEMORRHOID BANDING    . KNEE ARTHROSCOPY    . PACEMAKER INSERTION N/A 03/03/2015   Procedure: INSERTION DUAL LEAD PACEMAKER;  Surgeon: Isaias Cowman, MD;  Location: [facility] ORS;  Service: Cardiovascular;  Laterality: N/A;  . TONSILLECTOMY      Home Medications:  Allergies  as of 09/17/2019      Reactions   Atenolol    Other reaction(s): Unknown      Medication List       Accurate as of September 17, 2019  1:44 PM. If you have any questions, ask your nurse or doctor.        acetaminophen 500 MG tablet Commonly known as: TYLENOL Take by mouth.   alfuzosin 10 MG 24 hr tablet Commonly known as: UROXATRAL Take 1 tablet (10 mg total) by mouth daily with breakfast.   amoxicillin 875 MG tablet Commonly known as: AMOXIL Take 1 tablet (875 mg total) by mouth every 12 (twelve) hours.   apixaban 5 MG Tabs tablet Commonly known as: ELIQUIS Take by mouth.   aspirin EC 81 MG tablet Take by mouth.   atorvastatin 10 MG tablet Commonly known as: LIPITOR Take 1 tablet by mouth daily.   finasteride 5 MG tablet Commonly known as: PROSCAR Take 1 tablet (5 mg total) by mouth daily.   Fish Oil 1000 MG Caps Take 1 tablet by mouth 3 (three) times daily.   metoprolol succinate 50 MG 24 hr tablet Commonly known as: TOPROL-XL   metoprolol tartrate 25 MG tablet Commonly known as: LOPRESSOR Take 75 mg by mouth daily.   neomycin-polymyxin b-dexamethasone 3.5-10000-0.1 Susp Commonly known as: MAXITROL   OCUVITE PRESERVISION PO Take 1 tablet by mouth 2 (two) times daily.   Vitamin D3 50 MCG (2000 UT) capsule Take 1 capsule by mouth daily.       Allergies:  Allergies  Allergen Reactions  . Atenolol     Other reaction(s): Unknown  Family History: Family History  Problem Relation Age of Onset  . Diabetes Mother   . Lung cancer Father   . Cancer Paternal Uncle   . Prostate cancer Neg Hx   . Bladder Cancer Neg Hx   . Kidney cancer Neg Hx     Social History:  reports that he has never smoked. He has never used smokeless tobacco. He reports current alcohol use. He reports that he does not use drugs.   Physical Exam: BP (!) 87/52   Pulse 73   Ht 5\' 11"  (1.803 m)   Wt 195 lb (88.5 kg)   BMI 27.20 kg/m   Constitutional:  Alert and oriented,  No acute distress. HEENT: [place] AT, moist mucus membranes.  Trachea midline, no masses. Cardiovascular: No clubbing, cyanosis, or edema.  RRR Respiratory: Normal respiratory effort, no increased work of breathing.  Clear GI: Abdomen is soft, nontender, nondistended, no abdominal masses GU: No CVA tenderness Lymph: No cervical or inguinal lymphadenopathy. Skin: No rashes, bruises or suspicious lesions. Neurologic: Grossly intact, no focal deficits, moving all 4 extremities. Psychiatric: Normal mood and affect.   Assessment & Plan:    - Gross hematuria Recent CT with 1 cm enhancing polypoid mass within bladder diverticulum.  Options of scheduling office cystoscopy versus cystoscopy under anesthesia discussed.  He has elected to proceed with cystoscopy under anesthesia and possible TURBT/bladder biopsy.  The procedure was discussed in detail.  Will need cardiology clearance to hold Eliquis  The indications and nature of the planned procedure were discussed as well as the potential benefits and expected outcome.  Alternatives have been discussed.  Potential complications were discussed including but not limited to bleeding, infection and bladder perforation especially within a bladder diverticulum. The postoperative care and followup was reviewed.  The patient was informed that he may need additional treatment along with periodic surveillance cystoscopy.  All of his questions were answered and he desires to proceed.  - BPH with lower urinary tract symptoms Stable on finasteride/alfuzosin    Abbie Sons, MD  [facility] [address], [place], [place] [address] [phone]

## 2019-09-18 ENCOUNTER — Other Ambulatory Visit: Payer: Medicare HMO

## 2019-09-18 ENCOUNTER — Telehealth: Payer: Self-pay | Admitting: *Deleted

## 2019-09-18 ENCOUNTER — Telehealth: Payer: Self-pay | Admitting: Internal Medicine

## 2019-09-18 LAB — MICROSCOPIC EXAMINATION
RBC, Urine: >30 /hpf — AB (ref 0–2)
WBC, UA: >30 /hpf — AB (ref 0–5)

## 2019-09-18 LAB — URINALYSIS, COMPLETE
Bilirubin, UA: NEGATIVE
Glucose, UA: NEGATIVE
Ketones, UA: NEGATIVE
Nitrite, UA: NEGATIVE
Specific Gravity, UA: 1.025 (ref 1.005–1.030)
Urobilinogen, Ur: 1 mg/dL (ref 0.2–1.0)
pH, UA: 7 (ref 5.0–7.5)

## 2019-09-18 LAB — HEMATOCRIT: Hematocrit: 40.3 % (ref 37.5–51.0)

## 2019-09-18 NOTE — Progress Notes (Signed)
Tumor Board Documentation  Christopher [name] was presented by Dr Rogue Bussing at our Tumor Board on 09/18/2019, which included representatives from medical oncology, radiation oncology, navigation, pathology, radiology, surgical, surgical oncology, research, palliative care.  Christopher [name] currently presents as a current patient, for discussion with history of the following treatments: active survellience.  Additionally, we reviewed previous medical and familial history, history of present illness, and recent lab results along with all available histopathologic and imaging studies. The tumor board considered available treatment options and made the following recommendations: Biopsy, Active surveillance repeat scan in 3 - 6 months, Dr Bernardo Heater taking to surgery for TURPBT  The following procedures/referrals were also placed: No orders of the defined types were placed in this encounter.   Clinical Trial Status: not discussed   Staging used: Not Applicable  National site-specific guidelines   were discussed with respect to the case.  Tumor board is a meeting of clinicians from various specialty areas who evaluate and discuss patients for whom a multidisciplinary approach is being considered. Final determinations in the plan of care are those of the provider(s). The responsibility for follow up of recommendations given during tumor board is that of the provider.   Today's extended care, comprehensive team conference, Christopher [name] was not present for the discussion and was not examined.   Multidisciplinary Tumor Board is a multidisciplinary case peer review process.  Decisions discussed in the Multidisciplinary Tumor Board reflect the opinions of the specialists present at the conference without having examined the patient.  Ultimately, treatment and diagnostic decisions rest with the primary provider(s) and the patient.

## 2019-09-18 NOTE — Telephone Encounter (Signed)
-----   Message from Abbie Sons, MD sent at 09/18/2019  8:14 AM EST ----- Hematocrit was normal at 40.3

## 2019-09-18 NOTE — Telephone Encounter (Signed)
Reviewed imaging at the tumor conference-omental thickening as noted on the CT scan.  However it was felt that IR guided biopsy would be of low yield.  Open biopsy is an option/but obviously more invasive.   Given his age/lack of any symptoms-I think is reasonable to do a follow-up scan in 4 to 6 months.   It was also felt that the bladder lesion is unrelated to his abdominal findings.  Patient awaiting cystoscopy with Dr. Bernardo Heater.  I left a voicemail for the patient to call us back to discuss the above plan.  FYI-Dr. Stoioff/Dr.Thies.

## 2019-09-18 NOTE — Telephone Encounter (Signed)
Notified patient as instructed, patient pleased. Discussed follow-up appointments, patient agrees  

## 2019-09-19 ENCOUNTER — Other Ambulatory Visit: Payer: Self-pay | Admitting: Radiology

## 2019-09-19 ENCOUNTER — Encounter: Payer: Self-pay | Admitting: Urology

## 2019-09-19 DIAGNOSIS — D494 Neoplasm of unspecified behavior of bladder: Secondary | ICD-10-CM

## 2019-09-21 ENCOUNTER — Telehealth: Payer: Self-pay | Admitting: Internal Medicine

## 2019-09-21 DIAGNOSIS — K668 Other specified disorders of peritoneum: Secondary | ICD-10-CM

## 2019-09-21 DIAGNOSIS — K669 Disorder of peritoneum, unspecified: Secondary | ICD-10-CM

## 2019-09-21 NOTE — Telephone Encounter (Signed)
On 3/10-reviewed imaging of the tumor conference; omental thickening-low yield for biopsy.  Discussed the option of open biopsy.  Discussed the pros and cons of each option including surveillance.  As patient is asymptomatic it was recommend follow-up CT scan in 6 months.  Proceed with bladder biopsy/TURBT as planned.   Discussed with pt; in agreement.   C-follow-up in 6 months; MD; labs -CBC CMP; CT scan abdomen pelvis prior-  Dr.B

## 2019-09-22 NOTE — Addendum Note (Signed)
Addended by: Gloris Ham on: 09/22/2019 09:40 AM   Modules accepted: Orders

## 2019-09-26 ENCOUNTER — Other Ambulatory Visit: Payer: Self-pay

## 2019-09-26 ENCOUNTER — Encounter
Admission: RE | Admit: 2019-09-26 | Discharge: 2019-09-26 | Disposition: A | Discharge status: Home / Self Care | Payer: Medicare HMO | Source: Ambulatory Visit | Attending: Urology | Admitting: Urology

## 2019-09-26 HISTORY — DX: Malignant (primary) neoplasm, unspecified: C80.1

## 2019-09-26 HISTORY — DX: Presence of cardiac pacemaker: Z95.0

## 2019-09-26 NOTE — Pre-Procedure Instructions (Signed)
TC to Dr. Bethanne Ginger (Patinet's cardiologist) nurse, spoke with Nira Conn, RN stated she is working on Pacemaker form and clearance for patient.

## 2019-09-26 NOTE — Patient Instructions (Signed)
Your procedure is scheduled on: Tuesday October 07, 2019 Report to Day Surgery. To find out your arrival time please call [phone] between 1PM - 3PM on Monday October 06, 2019.  Remember: Instructions that are not followed completely may result in serious medical risk,  up to and including death, or upon the discretion of your surgeon and anesthesiologist your  surgery may need to be rescheduled.     _X__ 1. Do not eat food after midnight the night before your procedure.                 No gum chewing or hard candies. You may drink clear liquids up to 2 hours                 before you are scheduled to arrive for your surgery- DO not drink clear                 liquids within 2 hours of the start of your surgery.                 Clear Liquids include:  water, apple juice without pulp, clear Gatorade, G2 or                  Gatorade Zero (avoid Red/Purple/Blue), Black Coffee or Tea (Do not add                 anything to coffee or tea).  __X__2.  On the morning of surgery brush your teeth with toothpaste and water, you                may rinse your mouth with mouthwash if you wish.  Do not swallow any toothpaste of mouthwash.     _X__ 3.  No Alcohol for 24 hours before or after surgery.   _X__ 4.  Do Not Smoke or use e-cigarettes For 24 Hours Prior to Your Surgery.                 Do not use any chewable tobacco products for at least 6 hours prior to                 surgery.  __x__ 5.  Notify your doctor if there is any change in your medical condition      (cold, fever, infections).     Do not wear jewelry, make-up, hairpins, clips or nail polish. Do not wear lotions, powders, or perfumes. You may wear deodorant. Do not shave 48 hours prior to surgery. Men may shave face and neck. Do not bring valuables to the hospital.    [facility] is not responsible for any belongings or valuables.  Contacts, dentures or bridgework may not be worn into surgery. Leave  your suitcase in the car. After surgery it may be brought to your room. For patients admitted to the hospital, discharge time is determined by your treatment team.   Patients discharged the day of surgery will not be allowed to drive home.   Make arrangements for someone to be with you for the first 24 hours of your Same Day Discharge.   __x__ Take these medicines the morning of surgery with A SIP OF WATER:    1. acetaminophen (TYLENOL) 500 MG  2. finasteride (PROSCAR) 5  3. metoprolol succinate (TOPROL-XL)  __x__ Stop aspirin EC 81 MG and Omega-3 Fatty Acids (FISH OIL) 1000 MG  March 23, as instructed by your provider.    __x__  Stop  apixaban [facility]) 5 MG March 27, as instructed by your provider.   __x__ Stop Anti-inflammatories such as ibuprofen, Aleve, naproxen and or BC powders.    __x__ Stop supplements until after surgery.    __x__ Do not start any herbal supplements before your surgery.

## 2019-09-29 ENCOUNTER — Other Ambulatory Visit: Payer: Self-pay

## 2019-09-29 ENCOUNTER — Other Ambulatory Visit: Payer: Medicare HMO

## 2019-09-29 DIAGNOSIS — D494 Neoplasm of unspecified behavior of bladder: Secondary | ICD-10-CM

## 2019-09-29 NOTE — Addendum Note (Signed)
Addended by: Maryln Gottron on: 09/29/2019 10:52 AM   Modules accepted: Orders

## 2019-10-02 LAB — CULTURE, URINE COMPREHENSIVE

## 2019-10-03 ENCOUNTER — Other Ambulatory Visit
Admission: RE | Admit: 2019-10-03 | Discharge: 2019-10-03 | Disposition: A | Discharge status: Home / Self Care | Payer: Medicare HMO | Source: Ambulatory Visit | Attending: Urology | Admitting: Urology

## 2019-10-03 ENCOUNTER — Other Ambulatory Visit: Payer: Self-pay | Admitting: Urology

## 2019-10-03 DIAGNOSIS — Z01812 Encounter for preprocedural laboratory examination: Secondary | ICD-10-CM | POA: Diagnosis present

## 2019-10-03 DIAGNOSIS — Z20822 Contact with and (suspected) exposure to covid-19: Secondary | ICD-10-CM | POA: Insufficient documentation

## 2019-10-03 LAB — CBC
HCT: 38.4 % — ABNORMAL LOW (ref 39.0–52.0)
Hemoglobin: 12.7 g/dL — ABNORMAL LOW (ref 13.0–17.0)
MCH: 31.4 pg (ref 26.0–34.0)
MCHC: 33.1 g/dL (ref 30.0–36.0)
MCV: 95 fL (ref 80.0–100.0)
Platelets: 186 10*3/uL (ref 150–400)
RBC: 4.04 MIL/uL — ABNORMAL LOW (ref 4.22–5.81)
RDW: 14.6 % (ref 11.5–15.5)
WBC: 4.7 10*3/uL (ref 4.0–10.5)
nRBC: 0 % (ref 0.0–0.2)

## 2019-10-03 LAB — BASIC METABOLIC PANEL
Anion gap: 7 (ref 5–15)
BUN: 20 mg/dL (ref 8–23)
CO2: 28 mmol/L (ref 22–32)
Calcium: 9.3 mg/dL (ref 8.9–10.3)
Chloride: 107 mmol/L (ref 98–111)
Creatinine, Ser: 0.89 mg/dL (ref 0.61–1.24)
GFR calc Af Amer: >60 mL/min (ref >60)
GFR calc non Af Amer: >60 mL/min (ref >60)
Glucose, Bld: 101 mg/dL — ABNORMAL HIGH (ref 70–99)
Potassium: 4.4 mmol/L (ref 3.5–5.1)
Sodium: 142 mmol/L (ref 135–145)

## 2019-10-03 LAB — SARS CORONAVIRUS 2 (TAT 6-24 HRS): SARS Coronavirus 2: NEGATIVE

## 2019-10-03 MED ORDER — SULFAMETHOXAZOLE-TRIMETHOPRIM 800-160 MG PO TABS: 1.0000 | ORAL_TABLET | Freq: Two times a day (BID) | ORAL | 0 refills | Status: AC

## 2019-10-07 ENCOUNTER — Ambulatory Visit: Payer: Medicare HMO | Admitting: Registered Nurse

## 2019-10-07 ENCOUNTER — Encounter: Payer: Self-pay | Admitting: Urology

## 2019-10-07 ENCOUNTER — Ambulatory Visit
Admission: RE | Admit: 2019-10-07 | Discharge: 2019-10-07 | Disposition: A | Discharge status: Home / Self Care | Payer: Medicare HMO | Attending: Urology | Admitting: Urology

## 2019-10-07 ENCOUNTER — Other Ambulatory Visit: Payer: Self-pay

## 2019-10-07 ENCOUNTER — Encounter
Admission: RE | Disposition: A | Discharge status: Home / Self Care | Payer: Self-pay | Source: Home / Self Care | Attending: Urology

## 2019-10-07 DIAGNOSIS — H3530 Unspecified macular degeneration: Secondary | ICD-10-CM | POA: Insufficient documentation

## 2019-10-07 DIAGNOSIS — E785 Hyperlipidemia, unspecified: Secondary | ICD-10-CM | POA: Diagnosis not present

## 2019-10-07 DIAGNOSIS — Z950 Presence of cardiac pacemaker: Secondary | ICD-10-CM | POA: Insufficient documentation

## 2019-10-07 DIAGNOSIS — D494 Neoplasm of unspecified behavior of bladder: Secondary | ICD-10-CM | POA: Diagnosis not present

## 2019-10-07 DIAGNOSIS — C672 Malignant neoplasm of lateral wall of bladder: Secondary | ICD-10-CM | POA: Diagnosis not present

## 2019-10-07 DIAGNOSIS — I10 Essential (primary) hypertension: Secondary | ICD-10-CM | POA: Insufficient documentation

## 2019-10-07 DIAGNOSIS — Z85828 Personal history of other malignant neoplasm of skin: Secondary | ICD-10-CM | POA: Diagnosis not present

## 2019-10-07 DIAGNOSIS — Z79899 Other long term (current) drug therapy: Secondary | ICD-10-CM | POA: Insufficient documentation

## 2019-10-07 DIAGNOSIS — Z888 Allergy status to other drugs, medicaments and biological substances status: Secondary | ICD-10-CM | POA: Diagnosis not present

## 2019-10-07 DIAGNOSIS — N401 Enlarged prostate with lower urinary tract symptoms: Secondary | ICD-10-CM | POA: Diagnosis not present

## 2019-10-07 DIAGNOSIS — N323 Diverticulum of bladder: Secondary | ICD-10-CM | POA: Insufficient documentation

## 2019-10-07 DIAGNOSIS — Z7901 Long term (current) use of anticoagulants: Secondary | ICD-10-CM | POA: Insufficient documentation

## 2019-10-07 DIAGNOSIS — Z7982 Long term (current) use of aspirin: Secondary | ICD-10-CM | POA: Insufficient documentation

## 2019-10-07 DIAGNOSIS — I499 Cardiac arrhythmia, unspecified: Secondary | ICD-10-CM | POA: Diagnosis not present

## 2019-10-07 DIAGNOSIS — H905 Unspecified sensorineural hearing loss: Secondary | ICD-10-CM | POA: Insufficient documentation

## 2019-10-07 HISTORY — PX: TRANSURETHRAL RESECTION OF BLADDER TUMOR: SHX2575

## 2019-10-07 SURGERY — TURBT (TRANSURETHRAL RESECTION OF BLADDER TUMOR)
Anesthesia: General

## 2019-10-07 MED ORDER — FENTANYL CITRATE (PF) 100 MCG/2ML IJ SOLN
INTRAMUSCULAR | Status: DC | PRN
  Administered 2019-10-07 (×4): 25 ug via INTRAVENOUS

## 2019-10-07 MED ORDER — PROPOFOL 10 MG/ML IV BOLUS
INTRAVENOUS | Status: DC | PRN
  Administered 2019-10-07: 30 mg via INTRAVENOUS
  Administered 2019-10-07: 120 mg via INTRAVENOUS

## 2019-10-07 MED ORDER — PROPOFOL 10 MG/ML IV BOLUS
INTRAVENOUS | Status: AC
  Filled 2019-10-07: qty 40

## 2019-10-07 MED ORDER — ACETAMINOPHEN 10 MG/ML IV SOLN: 1000.0000 mg | Freq: Once | INTRAVENOUS | Status: DC | PRN

## 2019-10-07 MED ORDER — DEXAMETHASONE SODIUM PHOSPHATE 10 MG/ML IJ SOLN
INTRAMUSCULAR | Status: DC | PRN
  Administered 2019-10-07: 5 mg via INTRAVENOUS

## 2019-10-07 MED ORDER — LIDOCAINE HCL (PF) 2 % IJ SOLN
INTRAMUSCULAR | Status: AC
  Filled 2019-10-07: qty 10

## 2019-10-07 MED ORDER — FAMOTIDINE 20 MG PO TABS: 20.0000 mg | ORAL_TABLET | Freq: Once | ORAL | Status: AC

## 2019-10-07 MED ORDER — TRAMADOL HCL 50 MG PO TABS: 50.0000 mg | ORAL_TABLET | Freq: Four times a day (QID) | ORAL | 0 refills | Status: DC | PRN

## 2019-10-07 MED ORDER — FENTANYL CITRATE (PF) 100 MCG/2ML IJ SOLN: 25.0000 ug | INTRAMUSCULAR | Status: DC | PRN

## 2019-10-07 MED ORDER — CEFAZOLIN SODIUM-DEXTROSE 2-4 GM/100ML-% IV SOLN
INTRAVENOUS | Status: AC
  Filled 2019-10-07: qty 100

## 2019-10-07 MED ORDER — DEXMEDETOMIDINE HCL IN NACL 200 MCG/50ML IV SOLN
INTRAVENOUS | Status: DC | PRN
  Administered 2019-10-07: 8 ug via INTRAVENOUS

## 2019-10-07 MED ORDER — EPHEDRINE SULFATE 50 MG/ML IJ SOLN
INTRAMUSCULAR | Status: DC | PRN
  Administered 2019-10-07 (×2): 10 mg via INTRAVENOUS

## 2019-10-07 MED ORDER — LIDOCAINE HCL (CARDIAC) PF 100 MG/5ML IV SOSY
PREFILLED_SYRINGE | INTRAVENOUS | Status: DC | PRN
  Administered 2019-10-07: 100 mg via INTRAVENOUS

## 2019-10-07 MED ORDER — ONDANSETRON HCL 4 MG/2ML IJ SOLN
INTRAMUSCULAR | Status: DC | PRN
  Administered 2019-10-07: 4 mg via INTRAVENOUS

## 2019-10-07 MED ORDER — LACTATED RINGERS IV SOLN: INTRAVENOUS | Status: DC

## 2019-10-07 MED ORDER — ONDANSETRON HCL 4 MG/2ML IJ SOLN: 4.0000 mg | Freq: Once | INTRAMUSCULAR | Status: DC | PRN

## 2019-10-07 MED ORDER — FENTANYL CITRATE (PF) 100 MCG/2ML IJ SOLN
INTRAMUSCULAR | Status: AC
  Filled 2019-10-07: qty 2

## 2019-10-07 MED ORDER — OXYCODONE HCL 5 MG PO TABS: 5.0000 mg | ORAL_TABLET | Freq: Once | ORAL | Status: DC | PRN

## 2019-10-07 MED ORDER — CEFAZOLIN SODIUM-DEXTROSE 2-4 GM/100ML-% IV SOLN
2.0000 g | INTRAVENOUS | Status: AC
  Administered 2019-10-07: 2 g via INTRAVENOUS

## 2019-10-07 MED ORDER — EPHEDRINE 5 MG/ML INJ
INTRAVENOUS | Status: AC
  Filled 2019-10-07: qty 10

## 2019-10-07 MED ORDER — OXYCODONE HCL 5 MG/5ML PO SOLN: 5.0000 mg | Freq: Once | ORAL | Status: DC | PRN

## 2019-10-07 MED ORDER — FAMOTIDINE 20 MG PO TABS
ORAL_TABLET | ORAL | Status: AC
  Administered 2019-10-07: 20 mg via ORAL
  Filled 2019-10-07: qty 1

## 2019-10-07 SURGICAL SUPPLY — 22 items
BAG DRAIN CYSTO-URO LG1000N (MISCELLANEOUS) ×3 IMPLANT
BAG URINE DRAIN 2000ML AR STRL (UROLOGICAL SUPPLIES) ×3 IMPLANT
CATH FOLEY 2WAY 18X30 (CATHETERS) ×1 IMPLANT
CATH FOLEY 2WAY SIL 18X30 (CATHETERS) ×3
DRAPE UTILITY 15X26 TOWEL STRL (DRAPES) ×3 IMPLANT
DRSG TELFA 4X3 1S NADH ST (GAUZE/BANDAGES/DRESSINGS) ×3 IMPLANT
ELECT LOOP 22F BIPOLAR SML (ELECTROSURGICAL) ×3
ELECT REM PT RETURN 9FT ADLT (ELECTROSURGICAL)
ELECTRODE LOOP 22F BIPOLAR SML (ELECTROSURGICAL) ×1 IMPLANT
ELECTRODE REM PT RTRN 9FT ADLT (ELECTROSURGICAL) IMPLANT
GLOVE BIO SURGEON STRL SZ8 (GLOVE) ×3 IMPLANT
GOWN STRL REUS W/ TWL LRG LVL3 (GOWN DISPOSABLE) ×1 IMPLANT
GOWN STRL REUS W/TWL LRG LVL3 (GOWN DISPOSABLE) ×2
GOWN STRL REUS W/TWL XL LVL4 (GOWN DISPOSABLE) ×3 IMPLANT
IV NS IRRIG 3000ML ARTHROMATIC (IV SOLUTION) ×6 IMPLANT
KIT TURNOVER CYSTO (KITS) ×3 IMPLANT
LOOP CUT BIPOLAR 24F LRG (ELECTROSURGICAL) IMPLANT
PACK CYSTO AR (MISCELLANEOUS) ×3 IMPLANT
SET IRRIG Y TYPE TUR BLADDER L (SET/KITS/TRAYS/PACK) ×3 IMPLANT
SURGILUBE 2OZ TUBE FLIPTOP (MISCELLANEOUS) ×3 IMPLANT
SYRINGE IRR TOOMEY STRL 70CC (SYRINGE) ×3 IMPLANT
WATER STERILE IRR 1000ML POUR (IV SOLUTION) ×3 IMPLANT

## 2019-10-07 NOTE — Anesthesia Procedure Notes (Signed)
Procedure Name: LMA Insertion Date/Time: 10/07/2019 2:09 PM Performed by: Doreen Salvage, CRNA Pre-anesthesia Checklist: Patient identified, Patient being monitored, Timeout performed, Emergency Drugs available and Suction available Patient Re-evaluated:Patient Re-evaluated prior to induction Oxygen Delivery Method: Circle system utilized Preoxygenation: Pre-oxygenation with 100% oxygen Induction Type: IV induction Ventilation: Mask ventilation without difficulty LMA: LMA inserted LMA Size: 4.5 Tube type: Oral Number of attempts: 1 Placement Confirmation: positive ETCO2 and breath sounds checked- equal and bilateral Tube secured with: Tape Dental Injury: Teeth and Oropharynx as per pre-operative assessment

## 2019-10-07 NOTE — Transfer of Care (Signed)
Immediate Anesthesia Transfer of Care Note  Patient: Christopher [name]  Procedure(s) Performed: TRANSURETHRAL RESECTION OF BLADDER TUMOR (TURBT) (N/A )  Patient Location: PACU  Anesthesia Type:General  Level of Consciousness: drowsy    Airway & Oxygen Therapy: Patient Spontanous Breathing and Patient connected to face mask oxygen  Post-op Assessment: Report given to RN and Post -op Vital signs reviewed and stable  Post vital signs: Reviewed  Last Vitals:  Vitals Value Taken Time  BP 106/61 10/07/19 1455  Temp 36.3 C 10/07/19 1455  Pulse 61 10/07/19 1458  Resp 13 10/07/19 1458  SpO2 100 % 10/07/19 1458  Vitals shown include unvalidated device data.  Last Pain:  Vitals:   10/07/19 1455  TempSrc:   PainSc: (P) Asleep         Complications: No apparent anesthesia complications

## 2019-10-07 NOTE — Op Note (Signed)
Preoperative diagnosis: Bladder tumor (1.5 cm)  Postoperative diagnosis:  Bladder tumor (1.5 cm)  Procedure:  Cystoscopy Transurethral resection of bladder tumor (small)  Surgeon: Nicki Reaper C. Melroy Bougher, M.D.  Anesthesia: General  Complications: None  Intraoperative findings:  Bladder tumor: High-grade appearing papillary tumor just inside the neck of a left lateral wall bladder diverticulum near the 6 o'clock position There was mucosal with early papillary change within the diverticulum Erythematous bladder mucosa adjacent to diverticulum  EBL: Minimal  Specimens: Mucosal erythema adjacent to diverticulum Bladder tumor   Indication: Christopher [name] is an 86 y.o. male who was incidentally found on CT to have a 1 cm enhancing polypoid tumor adjacent to a left bladder wall diverticulum.  Patient declined office cystoscopy and elected cystoscopy under anesthesia with possible TURBT/biopsy. After reviewing the management options for treatment, he elected to proceed with the above surgical procedure(s). We have discussed the potential benefits and risks of the procedure, side effects of the proposed treatment, the likelihood of the patient achieving the goals of the procedure, and any potential problems that might occur during the procedure or recuperation. Informed consent has been obtained.  Description of procedure:  The patient was taken to the operating room and general anesthesia was induced.  The patient was placed in the dorsal lithotomy position, prepped and draped in the usual sterile fashion, and preoperative antibiotics were administered. A preoperative time-out was performed.   Cystourethroscopy was performed.  The patient's urethra was examined and was without stricture.  The prostate was remarkable for mild-moderate lateral lobe enlargement and moderate bladder neck elevation  The bladder was then systematically examined in its entirety.  The ureteral orifices were  normal-appearing with clear efflux.  Mucosal findings as described above.  The cystoscope was removed.  A 26 French continuous-flow resectoscope sheath with visual obturator was lubricated and passed per urethra. The bladder was then re-examined after the resectoscope was placed.    Initially the mucosa adjacent to the diverticulum was superficially resected in 2 areas and sent separately.  The bladder tumor was then resected including tissue from the neck of the diverticulum.  Care was taken not to resect too deeply within the diverticulum.  The resected area was then fulgurated.  The early papillary appearing tissue within the diverticulum proper was also fulgurated in addition to the resected areas adjacent to the diverticulum.  Hemostasis was then achieved with the loop cautery and the bladder was emptied and reinspected with no further bleeding noted at the end of the procedure.    The resectoscope was removed and a 32 French Foley catheter was placed with return of clear effluent.  After anesthetic reversal he was transported to the PACU in stable condition.  Plan: -Office follow-up 10/09/2019 for catheter removal -He will be contacted with the pathology results once received -Postop follow-up scheduled 10/27/2019   Zaylah Blecha C. Bernardo Heater,  MD

## 2019-10-07 NOTE — Discharge Instructions (Signed)
Transurethral Resection of Bladder Tumor (TURBT) or Bladder Biopsy   Definition:  Transurethral Resection of the Bladder Tumor is a surgical procedure used to diagnose and remove tumors within the bladder.   General instructions:     Your recent bladder surgery requires very little post hospital care but some definite precautions.  Despite the fact that no skin incisions were used, the area around the bladder incisions are raw and covered with scabs to promote healing and prevent bleeding. Certain precautions are needed to insure that the scabs are not disturbed over the next 2-4 weeks while the healing proceeds.  Because the raw surface inside your bladder and the irritating effects of urine you may expect frequency of urination and/or urgency (a stronger desire to urinate) and perhaps even getting up at night more often. This will usually resolve or improve slowly over the healing period. You may see some blood in your urine over the first 6 weeks. Do not be alarmed, even if the urine was clear for a while. Get off your feet and drink lots of fluids until clearing occurs. If you start to pass clots or don't improve call us.  Diet:  You may return to your normal diet immediately. Because of the raw surface of your bladder, alcohol, spicy foods, foods high in acid and drinks with caffeine may cause irritation or frequency and should be used in moderation. To keep your urine flowing freely and avoid constipation, drink plenty of fluids during the day (8-10 glasses). Tip: Avoid cranberry juice because it is very acidic.  Activity:  Your physical activity doesn't need to be restricted. However, if you are very active, you may see some blood in the urine. We suggest that you reduce your activity under the circumstances until the bleeding has stopped.  Bowels:  It is important to keep your bowels regular during the postoperative period. Straining with bowel movements can cause bleeding. A bowel  movement every other day is reasonable. Use a mild laxative if needed, such as milk of magnesia 2-3 tablespoons, or 2 Dulcolax tablets. Call if you continue to have problems. If you had been taking narcotics for pain, before, during or after your surgery, you may be constipated. Take a laxative if necessary.    Medication:  You should resume your pre-surgery medications unless told not to. In addition you may be given an antibiotic to prevent or treat infection. Antibiotics are not always necessary. All medication should be taken as prescribed until the bottles are finished unless you are having an unusual reaction to one of the drugs.  You may resume Eliquis Wednesday, 10/08/2019  [facility] [address], [place], [place] [address] [phone]   AMBULATORY SURGERY  DISCHARGE INSTRUCTIONS   1) The drugs that you were given will stay in your system until tomorrow so for the next 24 hours you should not:  A) Drive an automobile B) Make any legal decisions C) Drink any alcoholic beverage   2) You may resume regular meals tomorrow.  Today it is better to start with liquids and gradually work up to solid foods.  You may eat anything you prefer, but it is better to start with liquids, then soup and crackers, and gradually work up to solid foods.   3) Please notify your doctor immediately if you have any unusual bleeding, trouble breathing, redness and pain at the surgery site, drainage, fever, or pain not relieved by medication.    4) Additional Instructions:  Please  contact your physician with any problems or Same Day Surgery at [phone], Monday through Friday 6 am to 4 pm, or [place] at [facility] number at [phone].    Indwelling Urinary Catheter Care, Adult An indwelling urinary catheter is a thin tube that is put into your bladder. The tube helps to drain pee (urine) out of your body. The tube goes in through your urethra.  Your urethra is where pee comes out of your body. Your pee will come out through the catheter, then it will go into a bag (drainage bag). Take good care of your catheter so it will work well. How to wear your catheter and bag Supplies needed  Sticky tape (adhesive tape) or a leg strap.  Alcohol wipe or soap and water (if you use tape).  A clean towel (if you use tape).  Large overnight bag.  Smaller bag (leg bag). Wearing your catheter Attach your catheter to your leg with tape or a leg strap.  Make sure the catheter is not pulled tight.  If a leg strap gets wet, take it off and put on a dry strap.  If you use tape to hold the bag on your leg: 1. Use an alcohol wipe or soap and water to wash your skin where the tape made it sticky before. 2. Use a clean towel to pat-dry that skin. 3. Use new tape to make the bag stay on your leg. Wearing your bags You should have been given a large overnight bag.  You may wear the overnight bag in the day or night.  Always have the overnight bag lower than your bladder.  Do not let the bag touch the floor.  Before you go to sleep, put a clean plastic bag in a wastebasket. Then hang the overnight bag inside the wastebasket. You should also have a smaller leg bag that fits under your clothes.  Always wear the leg bag below your knee.  Do not wear your leg bag at night. How to care for your skin and catheter Supplies needed  A clean washcloth.  Water and mild soap.  A clean towel. Caring for your skin and catheter      Clean the skin around your catheter every day: 1. Wash your hands with soap and water. 2. Wet a clean washcloth in warm water and mild soap. 3. Clean the skin around your urethra.  If you are male:  Gently spread the folds of skin around your vagina (labia).  With the washcloth in your other hand, wipe the inner side of your labia on each side. Wipe from front to back.  If you are male:  Pull back any skin  that covers the end of your penis (foreskin).  With the washcloth in your other hand, wipe your penis in small circles. Start wiping at the tip of your penis, then move away from the catheter.  Move the foreskin back in place, if needed. 4. With your free hand, hold the catheter close to where it goes into your body.  Keep holding the catheter during cleaning so it does not get pulled out. 5. With the washcloth in your other hand, clean the catheter.  Only wipe downward on the catheter.  Do not wipe upward toward your body. Doing this may push germs into your urethra and cause infection. 6. Use a clean towel to pat-dry the catheter and the skin around it. Make sure to wipe off all soap. 7. Wash your hands with soap  and water.  Shower every day. Do not take baths.  Do not use cream, ointment, or lotion on the area where the catheter goes into your body, unless your doctor tells you to.  Do not use powders, sprays, or lotions on your genital area.  Check your skin around the catheter every day for signs of infection. Check for: ? Redness, swelling, or pain. ? Fluid or blood. ? Warmth. ? Pus or a bad smell. How to empty the bag Supplies needed  Rubbing alcohol.  Gauze pad or cotton ball.  Tape or a leg strap. Emptying the bag Pour the pee out of your bag when it is ?- full, or at least 2-3 times a day. Do this for your overnight bag and your leg bag. 1. Wash your hands with soap and water. 2. Separate (detach) the bag from your leg. 3. Hold the bag over the toilet or a clean pail. Keep the bag lower than your hips and bladder. This is so the pee (urine) does not go back into the tube. 4. Open the pour spout. It is at the bottom of the bag. 5. Empty the pee into the toilet or pail. Do not let the pour spout touch any surface. 6. Put rubbing alcohol on a gauze pad or cotton ball. 7. Use the gauze pad or cotton ball to clean the pour spout. 8. Close the pour spout. 9. Attach  the bag to your leg with tape or a leg strap. 10. Wash your hands with soap and water. Follow instructions for cleaning the drainage bag:  From the product maker.  As told by your doctor. How to change the bag Supplies needed  Alcohol wipes.  A clean bag.  Tape or a leg strap. Changing the bag Replace your bag when it starts to leak, smell bad, or look dirty. 1. Wash your hands with soap and water. 2. Separate the dirty bag from your leg. 3. Pinch the catheter with your fingers so that pee does not spill out. 4. Separate the catheter tube from the bag tube where these tubes connect (at the connection valve). Do not let the tubes touch any surface. 5. Clean the end of the catheter tube with an alcohol wipe. Use a different alcohol wipe to clean the end of the bag tube. 6. Connect the catheter tube to the tube of the clean bag. 7. Attach the clean bag to your leg with tape or a leg strap. Do not make the bag tight on your leg. 8. Wash your hands with soap and water. General rules   Never pull on your catheter. Never try to take it out. Doing that can hurt you.  Always wash your hands before and after you touch your catheter or bag. Use a mild, fragrance-free soap. If you do not have soap and water, use hand sanitizer.  Always make sure there are no twists or bends (kinks) in the catheter tube.  Always make sure there are no leaks in the catheter or bag.  Drink enough fluid to keep your pee pale yellow.  Do not take baths, swim, or use a hot tub.  If you are male, wipe from front to back after you poop (have a bowel movement). Contact a doctor if:  Your pee is cloudy.  Your pee smells worse than usual.  Your catheter gets clogged.  Your catheter leaks.  Your bladder feels full. Get help right away if:  You have redness, swelling, or pain where the  catheter goes into your body.  You have fluid, blood, pus, or a bad smell coming from the area where the catheter  goes into your body.  Your skin feels warm where the catheter goes into your body.  You have a fever.  You have pain in your: ? Belly (abdomen). ? Legs. ? Lower back. ? Bladder.  You see blood in the catheter.  Your pee is pink or red.  You feel sick to your stomach (nauseous).  You throw up (vomit).  You have chills.  Your pee is not draining into the bag.  Your catheter gets pulled out. Summary  An indwelling urinary catheter is a thin tube that is placed into the bladder to help drain pee (urine) out of the body.  The catheter is placed into the part of the body that drains pee from the bladder (urethra).  Taking good care of your catheter will keep it working properly and help prevent problems.  Always wash your hands before and after touching your catheter or bag.  Never pull on your catheter or try to take it out. This information is not intended to replace advice given to you by your health care provider. Make sure you discuss any questions you have with your health care provider. Document Revised: 10/18/2018 Document Reviewed: 02/09/2017 Elsevier Patient Education  [place].

## 2019-10-07 NOTE — Interval H&P Note (Signed)
History and Physical Interval Note:  10/07/2019 1:53 PM  Christopher [name]  has presented today for surgery, with the diagnosis of bladder tumor.  The various methods of treatment have been discussed with the patient and family. After consideration of risks, benefits and other options for treatment, the patient has consented to  Procedure(s): TRANSURETHRAL RESECTION OF BLADDER TUMOR (TURBT) (N/A) as a surgical intervention.  The patient's history has been reviewed, patient examined, no change in status, stable for surgery.  I have reviewed the patient's chart and labs.  Questions were answered to the patient's satisfaction.     [place]

## 2019-10-07 NOTE — Anesthesia Postprocedure Evaluation (Signed)
Anesthesia Post Note  Patient: Christopher [name]  Procedure(s) Performed: TRANSURETHRAL RESECTION OF BLADDER TUMOR (TURBT) (N/A )  Patient location during evaluation: PACU Anesthesia Type: General Level of consciousness: awake and [name] Pain management: pain level controlled Vital Signs Assessment: post-procedure vital signs reviewed and stable Respiratory status: spontaneous breathing, nonlabored ventilation, respiratory function stable and patient connected to nasal cannula oxygen Cardiovascular status: blood pressure returned to baseline and stable Postop Assessment: no apparent nausea or vomiting Anesthetic complications: no     Last Vitals:  Vitals:   10/07/19 1525 10/07/19 1540  BP: 115/62 135/66  Pulse: (!) 59 60  Resp: 16 (!) 21  Temp:    SpO2: 95% 96%    Last Pain:  Vitals:   10/07/19 1546  TempSrc:   PainSc: 2                  Arita Miss

## 2019-10-07 NOTE — Anesthesia Preprocedure Evaluation (Addendum)
Anesthesia Evaluation  Patient identified by MRN, date of birth, ID band Patient awake    Reviewed: Allergy & Precautions, NPO status , Patient's Chart, lab work & pertinent test results  History of Anesthesia Complications Negative for: history of anesthetic complications  Airway Mallampati: III  TM Distance: >3 FB Neck ROM: Full    Dental no notable dental hx. (+) Teeth Intact, Dental Advisory Given   Pulmonary neg pulmonary ROS, neg sleep apnea, neg COPD, Patient abstained from smoking.Not current smoker,    Pulmonary exam normal breath sounds clear to auscultation       Cardiovascular Exercise Tolerance: Good METShypertension, Pt. on medications (-) CAD and (-) Past MI + dysrhythmias Atrial Fibrillation + pacemaker + Cardiac Defibrillator  Rhythm:Regular Rate:Normal - Systolic murmurs Pacemaker indications:symptomatic bradycardia Model #: dual lead inserted 03/03/15 [employer] [phone] Date of Last Interrogation: 12/30/18 (every 6 months) normal functioning, estimated longevity 5.5 yrs afib burden was 100% . EKG (02/19/19 atrial paced rhythm with prolonged AV conduction with occ PVC ) Hyperlipidemia - on Atorvastatin Last cardiology visit 03/21/19  03/03/19 NM Myocardial perfusion - no evidence of reversible ischemia 03/03/19 Echo EF>55% LV function normal    Neuro/Psych negative neurological ROS  negative psych ROS   GI/Hepatic neg GERD  ,(+)     (-) substance abuse  ,   Endo/Other  neg diabetes  Renal/GU negative Renal ROS     Musculoskeletal   Abdominal   Peds  Hematology   Anesthesia Other Findings Past Medical History: No date: BPH (benign prostatic hyperplasia) No date: Cancer ([place])     Comment:  skin cancer on face carcinoma No date: ED (erectile dysfunction) No date: Rosanna Randy disease No date: HLD (hyperlipidemia) No date: Hypertension No date: Macular degeneration No date: Osteopenia No  date: Presence of permanent cardiac pacemaker No date: Sensorineural hearing loss  Reproductive/Obstetrics                            Anesthesia Physical Anesthesia Plan  ASA: III  Anesthesia Plan: General   Post-op Pain Management:    Induction: Intravenous  PONV Risk Score and Plan: 2 and Ondansetron and Dexamethasone  Airway Management Planned: Oral ETT and LMA  Additional Equipment: None  Intra-op Plan:   Post-operative Plan: Extubation in OR  Informed Consent: I have reviewed the patients History and Physical, chart, labs and discussed the procedure including the risks, benefits and alternatives for the proposed anesthesia with the patient or authorized representative who has indicated his/her understanding and acceptance.     Dental advisory given  Plan Discussed with: CRNA and Surgeon  Anesthesia Plan Comments: (Discussed risks of anesthesia with patient, including PONV, sore throat, lip/dental damage. Rare risks discussed as well, such as cardiorespiratory and neurological sequelae. Patient understands. Patient pacer dependent. Will have magnet available if necessary, but since any EMI would be below umbilicus, low likelihood of interference.)        Anesthesia Quick Evaluation

## 2019-10-09 ENCOUNTER — Encounter: Payer: Self-pay | Admitting: Physician Assistant

## 2019-10-09 ENCOUNTER — Ambulatory Visit (INDEPENDENT_AMBULATORY_CARE_PROVIDER_SITE_OTHER): Payer: Medicare HMO | Admitting: Physician Assistant

## 2019-10-09 ENCOUNTER — Other Ambulatory Visit: Payer: Self-pay

## 2019-10-09 VITALS — BP 123/72 | HR 87 | Ht 71.0 in | Wt 199.0 lb

## 2019-10-09 DIAGNOSIS — D494 Neoplasm of unspecified behavior of bladder: Secondary | ICD-10-CM

## 2019-10-09 LAB — SURGICAL PATHOLOGY

## 2019-10-09 NOTE — Progress Notes (Signed)
Catheter Removal  Patient is present today for a catheter removal.  76ml of water was drained from the balloon. A [id number] silicone foley cath was removed from the bladder no complications were noted . Patient tolerated well.  Performed by: Debroah Loop, PA-C   Follow up/ Additional notes: postop follow-up with Dr. Bernardo Heater as below. Future Appointments  Date Time Provider [place]  10/27/2019  1:45 PM Stoioff, Ronda Fairly, MD [facility] None

## 2019-10-16 ENCOUNTER — Telehealth: Payer: Self-pay | Admitting: Urology

## 2019-10-16 NOTE — Telephone Encounter (Signed)
I contacted Christopher [name] to discuss his pathology report.  He has had some persistent hematuria with occasional blood clot.  He has resumed his Eliquis.  No orthostatic changes.  I did recommend he hold his Eliquis for 2 to 3 days and call us back for persistent hematuria  His pathology did show high-grade urothelial carcinoma with lamina propria invasion.  No muscle invasion was noted.  It was also associated carcinoma in situ.  I would recommend induction BCG which I discussed with most common side effects of irritative voiding symptoms, low-grade fever/malaise and rarely BCG sepsis.  He has a follow-up scheduled on 4/19 and will discuss BCG further.

## 2019-10-20 ENCOUNTER — Telehealth: Payer: Self-pay | Admitting: Urology

## 2019-10-20 NOTE — Telephone Encounter (Signed)
Pt called office, Janett Billow took call at my desk and advised per note from [facility].

## 2019-10-26 NOTE — Progress Notes (Signed)
10/27/19 6:53 PM   [name] [birth year] [id number]  Referring provider: Ezequiel Kayser, MD [facility] [place],   [id number]  Chief Complaint  Patient presents with  . Follow-up    HPI: Christopher [name] is a 86 y.o. M who returns today for 2-3 week f/u s/p TURBT on 10/07/19.   -cath removed on 4/12021 -resumed Eliquis, no orthostatic changes -Surg path reviewed, high grade urothelial carcinoma with lamina propria invasion and carcinoma in situ -reports of intermittent hematuria for last couple weeks -mild hematuria recently  -frequency and urgency   PMH: Past Medical History:  Diagnosis Date  . BPH (benign prostatic hyperplasia)   . Cancer ([place])    skin cancer on face carcinoma  . ED (erectile dysfunction)   . Gilbert disease   . HLD (hyperlipidemia)   . Hypertension   . Macular degeneration   . Osteopenia   . Presence of permanent cardiac pacemaker   . Sensorineural hearing loss     Surgical History: Past Surgical History:  Procedure Laterality Date  . CATARACT EXTRACTION    . HEMORRHOIDECTOMY WITH HEMORRHOID BANDING    . KNEE ARTHROSCOPY    . PACEMAKER INSERTION N/A 03/03/2015   Procedure: INSERTION DUAL LEAD PACEMAKER;  Surgeon: Isaias Cowman, MD;  Location: [facility] ORS;  Service: Cardiovascular;  Laterality: N/A;  . TONSILLECTOMY    . TRANSURETHRAL RESECTION OF BLADDER TUMOR N/A 10/07/2019   Procedure: TRANSURETHRAL RESECTION OF BLADDER TUMOR (TURBT);  Surgeon: Abbie Sons, MD;  Location: [facility] ORS;  Service: Urology;  Laterality: N/A;    Home Medications:  Allergies as of 10/27/2019      Reactions   Atenolol    Happened a long time ago and can't recall      Medication List       Accurate as of October 27, 2019 11:59 PM. If you have any questions, ask your nurse or doctor.        acetaminophen 500 MG tablet Commonly known as: TYLENOL Take 1,000 mg by mouth every 6 (six) hours as needed for moderate  pain.   alfuzosin 10 MG 24 hr tablet Commonly known as: UROXATRAL Take 1 tablet (10 mg total) by mouth daily with breakfast.   apixaban 5 MG Tabs tablet Commonly known as: ELIQUIS Take 5 mg by mouth 2 (two) times daily.   aspirin EC 81 MG tablet Take 81 mg by mouth daily.   atorvastatin 10 MG tablet Commonly known as: LIPITOR Take 10 mg by mouth daily.   calcium carbonate 1500 (600 Ca) MG Tabs tablet Commonly known as: OSCAL Take 600 mg of elemental calcium by mouth daily with breakfast.   cholecalciferol 25 MCG (1000 UNIT) tablet Commonly known as: VITAMIN D Take 1,000 Units by mouth daily.   finasteride 5 MG tablet Commonly known as: PROSCAR Take 1 tablet (5 mg total) by mouth daily.   Fish Oil 1000 MG Caps Take 1,000 mg by mouth 3 (three) times daily.   MENS MULTIVITAMIN PO Take 1 tablet by mouth daily.   PRESERVISION AREDS 2 PO Take 1 tablet by mouth in the morning and at bedtime.   metoprolol succinate 25 MG 24 hr tablet Commonly known as: TOPROL-XL Take 25 mg by mouth daily.   metoprolol succinate 50 MG 24 hr tablet Commonly known as: TOPROL-XL Take 50 mg by mouth daily.   traMADol 50 MG tablet Commonly known as: ULTRAM Take 1 tablet (50 mg total) by mouth every 6 (  six) hours as needed for moderate pain.       Allergies:  Allergies  Allergen Reactions  . Atenolol     Happened a long time ago and can't recall    Family History: Family History  Problem Relation Age of Onset  . Diabetes Mother   . Lung cancer Father   . Cancer Paternal Uncle   . Prostate cancer Neg Hx   . Bladder Cancer Neg Hx   . Kidney cancer Neg Hx     Social History:  reports that he has never smoked. He has never used smokeless tobacco. He reports current alcohol use. He reports that he does not use drugs.   Physical Exam: BP (!) 143/74   Pulse 70   Ht 5\' 11"  (1.803 m)   Wt 195 lb (88.5 kg)   BMI 27.20 kg/m   Constitutional:  Alert and oriented, No acute  distress. HEENT: [place] AT, moist mucus membranes.  Trachea midline, no masses. Cardiovascular: No clubbing, cyanosis, or edema. Respiratory: Normal respiratory effort, no increased work of breathing. Skin: No rashes, bruises or suspicious lesions. Neurologic: Grossly intact, no focal deficits, moving all 4 extremities. Psychiatric: Normal mood and affect.   Assessment & Plan:    -T1 urothelial carcinoma bladder with CIS TURBT on 10/07/19.  High grade urothelial carcinoma with lamina propria invasion and carcinoma in situ The pathology report has been discussed in detail and recommended BCG induction.  BCG was discussed along with most common side effects including frequency, urgency, burning with urination, low incidence of flu like symptoms and rare incidence of BCG sepsis.  All questions were answered and he has elected to proceed with [facility] induction   [employer] [address], [place], [place] [address] [phone]  I, Lucas Mallow, am acting as a scribe for Dr. Nicki Reaper C. Chazz Philson,  I have reviewed the above documentation for accuracy and completeness, and I agree with the above.    Abbie Sons, MD

## 2019-10-27 ENCOUNTER — Ambulatory Visit (INDEPENDENT_AMBULATORY_CARE_PROVIDER_SITE_OTHER): Payer: Medicare HMO | Admitting: Urology

## 2019-10-27 ENCOUNTER — Other Ambulatory Visit: Payer: Self-pay

## 2019-10-27 ENCOUNTER — Encounter: Payer: Self-pay | Admitting: Urology

## 2019-10-27 VITALS — BP 143/74 | HR 70 | Ht 71.0 in | Wt 195.0 lb

## 2019-10-27 DIAGNOSIS — C679 Malignant neoplasm of bladder, unspecified: Secondary | ICD-10-CM

## 2019-10-27 NOTE — Patient Instructions (Signed)

## 2019-10-29 ENCOUNTER — Encounter: Payer: Self-pay | Admitting: Urology

## 2019-10-29 DIAGNOSIS — C679 Malignant neoplasm of bladder, unspecified: Secondary | ICD-10-CM | POA: Insufficient documentation

## 2019-10-30 ENCOUNTER — Telehealth: Payer: Self-pay

## 2019-10-30 NOTE — Telephone Encounter (Signed)
Patient called wanting to know what chance he has of his bladder cancer metastasizing, please advise?

## 2019-10-30 NOTE — Telephone Encounter (Signed)
Since he does not have muscle invasion and metastasis would be unlikely.  Dr. Rogue Bussing does not feel the area as he is following in the abdomen are related to bladder cancer.

## 2019-11-03 NOTE — Telephone Encounter (Signed)
Patient notified

## 2019-11-03 NOTE — Telephone Encounter (Signed)
Left patient message to call 

## 2019-11-12 ENCOUNTER — Ambulatory Visit: Payer: Medicare HMO | Admitting: Dermatology

## 2019-11-12 ENCOUNTER — Other Ambulatory Visit: Payer: Self-pay

## 2019-11-12 DIAGNOSIS — L570 Actinic keratosis: Secondary | ICD-10-CM | POA: Diagnosis not present

## 2019-11-12 DIAGNOSIS — L820 Inflamed seborrheic keratosis: Secondary | ICD-10-CM

## 2019-11-12 DIAGNOSIS — Z85828 Personal history of other malignant neoplasm of skin: Secondary | ICD-10-CM

## 2019-11-12 DIAGNOSIS — L578 Other skin changes due to chronic exposure to nonionizing radiation: Secondary | ICD-10-CM

## 2019-11-12 DIAGNOSIS — L821 Other seborrheic keratosis: Secondary | ICD-10-CM

## 2019-11-12 NOTE — Patient Instructions (Signed)

## 2019-11-12 NOTE — Progress Notes (Signed)
   Follow-Up Visit   Subjective  Christopher [name] is a 85 y.o. male who presents for the following: Skin Cancer (3 months f/u R cheek hx of SCC) and Seborrheic Keratosis (check irritated itchy spots on my back)  The following portions of the chart were reviewed this encounter and updated as appropriate:  Tobacco  Allergies  Meds  Problems  Med Hx  Surg Hx  Fam Hx     Review of Systems:  No other skin or systemic complaints except as noted in HPI or Assessment and Plan.  Objective  Well appearing patient in no apparent distress; mood and affect are within normal limits.  A focused examination was performed including face, ears, back . Relevant physical exam findings are noted in the Assessment and Plan.  Objective  Neck, face, ears (17): Erythematous thin papules/macules with gritty scale.   Objective  Back (22): Erythematous keratotic or waxy stuck-on papule or plaque.    Assessment & Plan  AK (actinic keratosis) (17) Neck, face, ears  Destruction of lesion - Neck, face, ears Complexity: simple   Destruction method: cryotherapy   Informed consent: discussed and consent obtained   Timeout:  patient name, date of birth, surgical site, and procedure verified Lesion destroyed using liquid nitrogen: Yes   Region frozen until ice ball extended beyond lesion: Yes   Outcome: patient tolerated procedure well with no complications   Post-procedure details: wound care instructions given    Inflamed seborrheic keratosis (22) Back  Destruction of lesion - Back Complexity: simple   Destruction method: cryotherapy   Informed consent: discussed and consent obtained   Timeout:  patient name, date of birth, surgical site, and procedure verified Lesion destroyed using liquid nitrogen: Yes   Region frozen until ice ball extended beyond lesion: Yes   Outcome: patient tolerated procedure well with no complications   Post-procedure details: wound care instructions given      Actinic Damage - diffuse scaly erythematous macules with underlying dyspigmentation - Recommend daily broad spectrum sunscreen SPF 30+ to sun-exposed areas, reapply every 2 hours as needed.  - Call for new or changing lesions.  Seborrheic Keratoses - Stuck-on, waxy, tan-brown papules and plaques  - Discussed benign etiology and prognosis. - Observe - Call for any changes  History of Squamous Cell Carcinoma of the Skin - No evidence of recurrence today - No lymphadenopathy - Recommend regular full body skin exams - Recommend daily broad spectrum sunscreen SPF 30+ to sun-exposed areas, reapply every 2 hours as needed.  - Call if any new or changing lesions are noted between office visits   Return in about 3 months (around 02/12/2020) for [place], [place] .  IMarye Round, CMA, am acting as scribe for Sarina Ser, MD .  Documentation: I have reviewed the above documentation for accuracy and completeness, and I agree with the above.  Sarina Ser, MD

## 2019-11-14 ENCOUNTER — Encounter: Payer: Self-pay | Admitting: Dermatology

## 2019-11-19 ENCOUNTER — Other Ambulatory Visit: Payer: Self-pay | Admitting: Physical Medicine & Rehabilitation

## 2019-11-19 ENCOUNTER — Other Ambulatory Visit (HOSPITAL_COMMUNITY): Payer: Self-pay | Admitting: Physical Medicine & Rehabilitation

## 2019-11-19 DIAGNOSIS — M5416 Radiculopathy, lumbar region: Secondary | ICD-10-CM

## 2019-11-21 ENCOUNTER — Other Ambulatory Visit: Payer: Self-pay

## 2019-11-21 ENCOUNTER — Ambulatory Visit (INDEPENDENT_AMBULATORY_CARE_PROVIDER_SITE_OTHER): Payer: Medicare HMO | Admitting: Physician Assistant

## 2019-11-21 DIAGNOSIS — N3001 Acute cystitis with hematuria: Secondary | ICD-10-CM | POA: Diagnosis not present

## 2019-11-21 DIAGNOSIS — C679 Malignant neoplasm of bladder, unspecified: Secondary | ICD-10-CM

## 2019-11-21 MED ORDER — SULFAMETHOXAZOLE-TRIMETHOPRIM 800-160 MG PO TABS: 1.0000 | ORAL_TABLET | Freq: Two times a day (BID) | ORAL | 0 refills | Status: AC

## 2019-11-21 NOTE — Progress Notes (Signed)
Patient presented to clinic today for induction BCG, however urine is grossly infected today.  Patient denies irritative voiding symptoms today. Will start him on empiric Bactrim DS twice daily x7 days and send urine for culture.  Will plan to initiate BCG next week.  I spent 10 minutes on the day of the encounter to include pre-visit record review, face-to-face time with the patient, and post-visit ordering of tests.  Debroah Loop, PA-C  11/21/19 10:52 AM

## 2019-11-22 LAB — MICROSCOPIC EXAMINATION
RBC, Urine: >30 /hpf — AB (ref 0–2)
WBC, UA: >30 /hpf — AB (ref 0–5)

## 2019-11-22 LAB — URINALYSIS, COMPLETE
Bilirubin, UA: NEGATIVE
Glucose, UA: NEGATIVE
Ketones, UA: NEGATIVE
Nitrite, UA: NEGATIVE
Specific Gravity, UA: 1.025 (ref 1.005–1.030)
Urobilinogen, Ur: 0.2 mg/dL (ref 0.2–1.0)
pH, UA: 6 (ref 5.0–7.5)

## 2019-11-27 ENCOUNTER — Telehealth: Payer: Self-pay | Admitting: *Deleted

## 2019-11-27 LAB — CULTURE, URINE COMPREHENSIVE

## 2019-11-27 NOTE — Telephone Encounter (Addendum)
Spoke with patient to inform per Debroah Loop, PA urine culture resulted and is treated with the correct antibiotic.Will wait to see if hematuria has improved tomorrow morning,keep appointment as scheduled for now. Voiced understanding.

## 2019-11-28 ENCOUNTER — Ambulatory Visit: Payer: Self-pay | Admitting: Physician Assistant

## 2019-11-28 ENCOUNTER — Telehealth: Payer: Self-pay

## 2019-11-28 NOTE — Telephone Encounter (Signed)
Returned call, see encounter

## 2019-11-28 NOTE — Telephone Encounter (Signed)
ERROR

## 2019-11-28 NOTE — Telephone Encounter (Signed)
Patient informed, voiced understanding.  °

## 2019-11-28 NOTE — Telephone Encounter (Signed)
Pt LM returning your call  

## 2019-11-28 NOTE — Telephone Encounter (Signed)
No, do not hold Eliquis for now. Please counsel him to continue the medication as-is and we will see if his hematuria resolves on its own by his next appointment.

## 2019-11-28 NOTE — Telephone Encounter (Signed)
Called patient to discuss symptoms. Patient states hematuria has not improved, still ongoing. Completed Bactrim last night.  Patient asked if he should stop Eliquis prior to BCG treatment to not prolong treatments?

## 2019-12-01 ENCOUNTER — Ambulatory Visit (HOSPITAL_COMMUNITY)
Admission: RE | Admit: 2019-12-01 | Discharge: 2019-12-01 | Disposition: A | Discharge status: Home / Self Care | Payer: Medicare HMO | Source: Ambulatory Visit | Attending: Physical Medicine & Rehabilitation | Admitting: Physical Medicine & Rehabilitation

## 2019-12-01 ENCOUNTER — Other Ambulatory Visit: Payer: Self-pay

## 2019-12-01 DIAGNOSIS — M5416 Radiculopathy, lumbar region: Secondary | ICD-10-CM | POA: Insufficient documentation

## 2019-12-01 IMAGING — MR MR LUMBAR SPINE W/O CM
4 of 5 series · 19 of 48 positions shown · non-contrast
Comparison: CT abdomen and pelvis [DATE]

CLINICAL DATA: Lumbar radiculopathy. Low back pain.

EXAM:
MRI LUMBAR SPINE WITHOUT CONTRAST
TECHNIQUE: Multiplanar, multisequence MR imaging of the lumbar spine was
performed. No intravenous contrast was administered.

[Series 4: T2 · sagittal · 4.0mm · 0.55mm/px · 6 of 16 slices shown (1 of 2)]
[im 1/16]
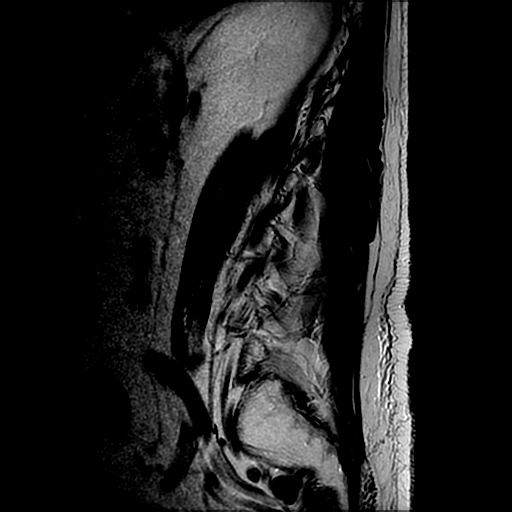
[im 4/16]
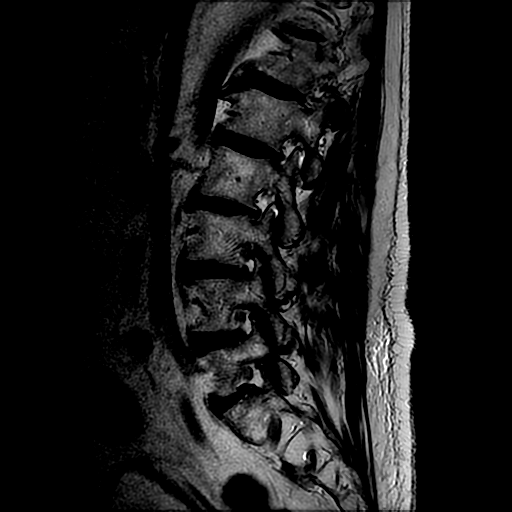
[im 7/16]
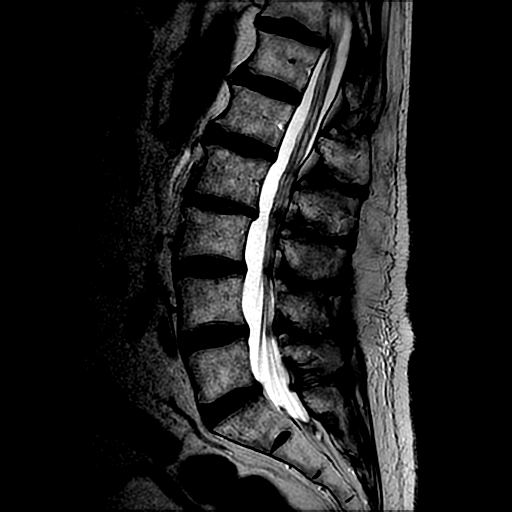
[im 10/16]
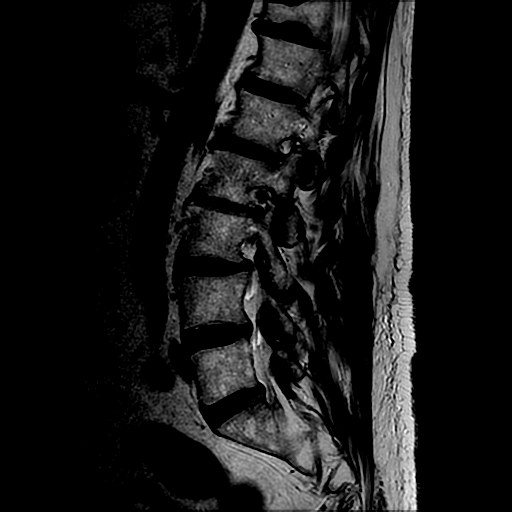
[im 13/16]
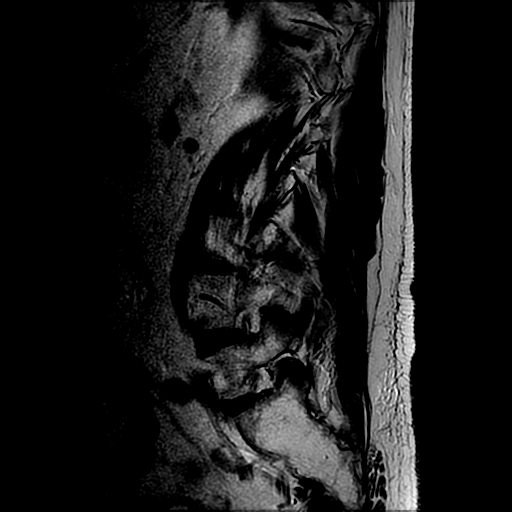
[im 16/16]
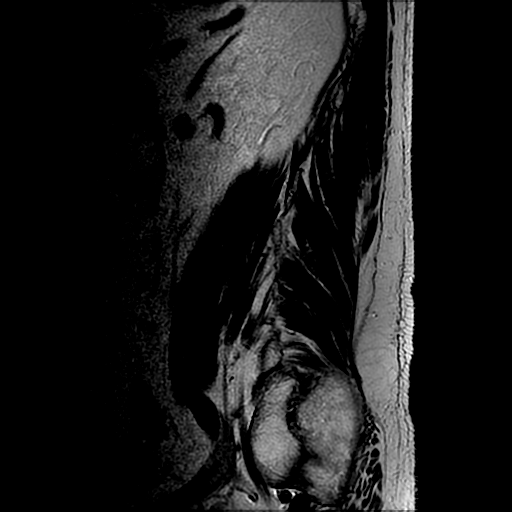

[Series 5: T1 · sagittal · 4.0mm · 0.55mm/px · 3 of 16 slices shown (1 of 2)]
[im 4/16]
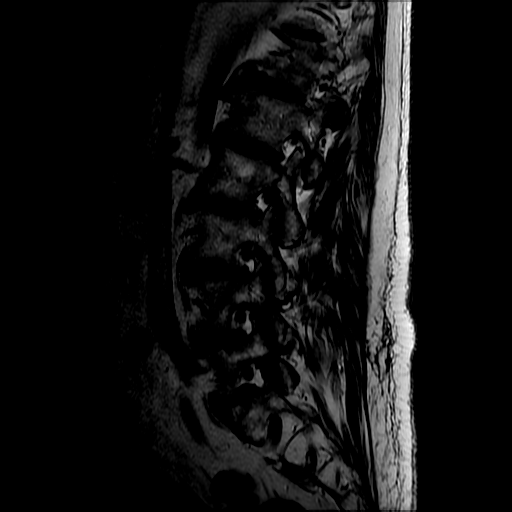
[im 10/16]
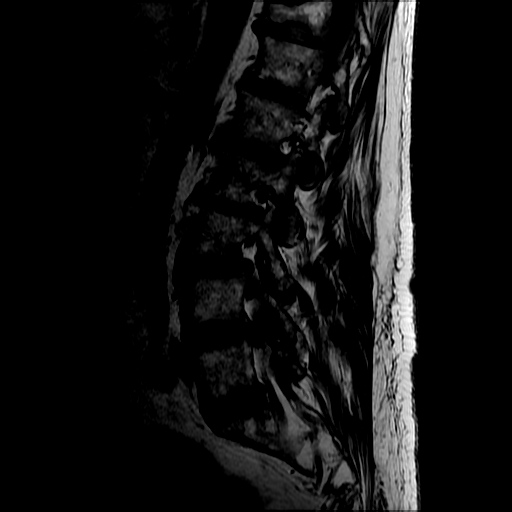
[im 16/16]
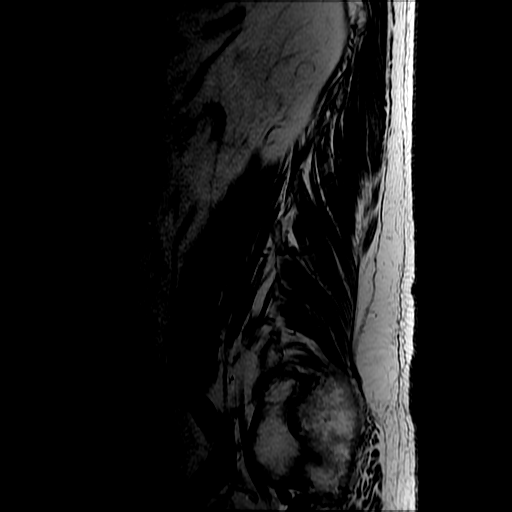

[Series 6: T1 · axial · 4.0mm · 0.39mm/px · z∈[-79,+79]mm · 3 of 38 slices shown (2 of 2)]
[im 6/38]
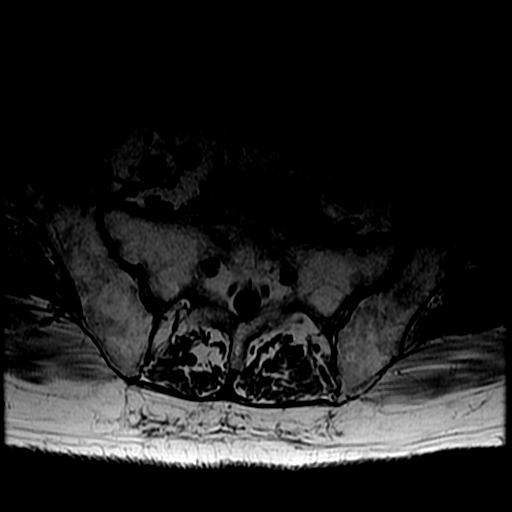
[im 19/38]
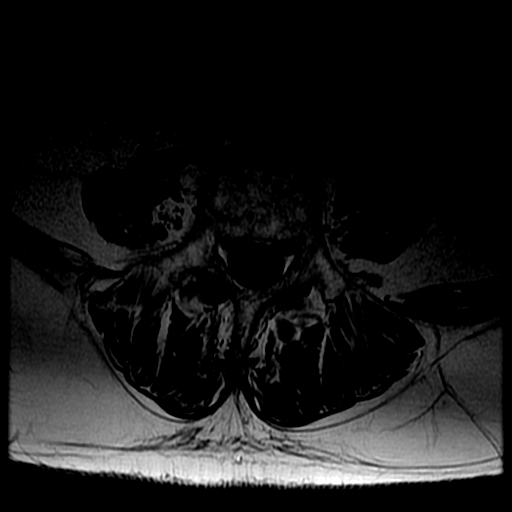
[im 32/38]
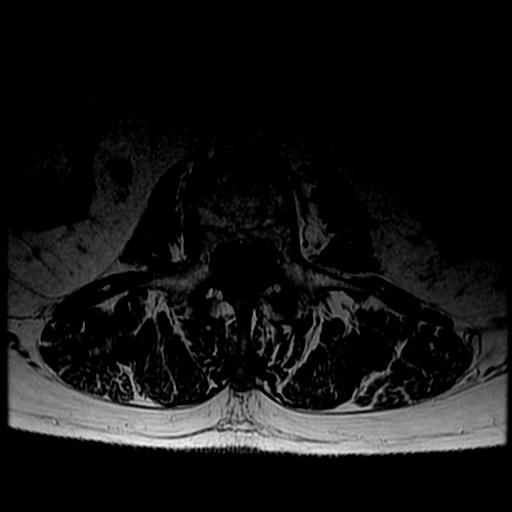

[Series 7: T2 · axial · 4.0mm · 0.39mm/px · z∈[-103,+79]mm · 7 of 38 slices shown (2 of 2)]
[im 1/38]
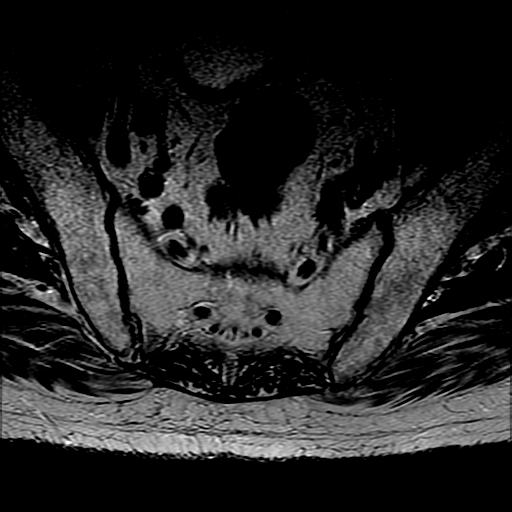
[im 6/38]
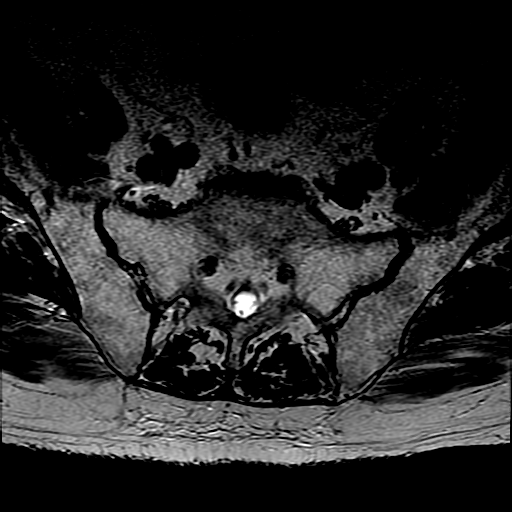
[im 11/38]
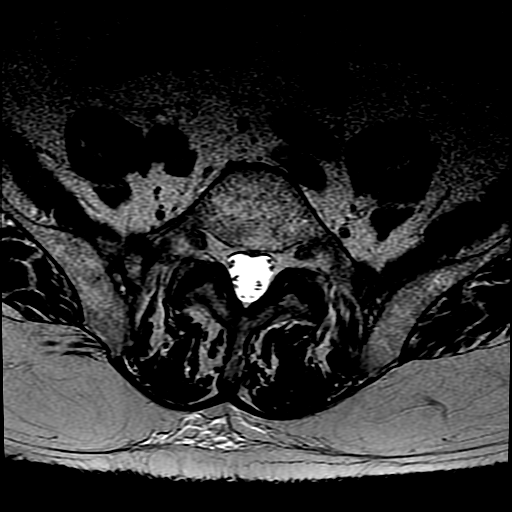
[im 16/38]
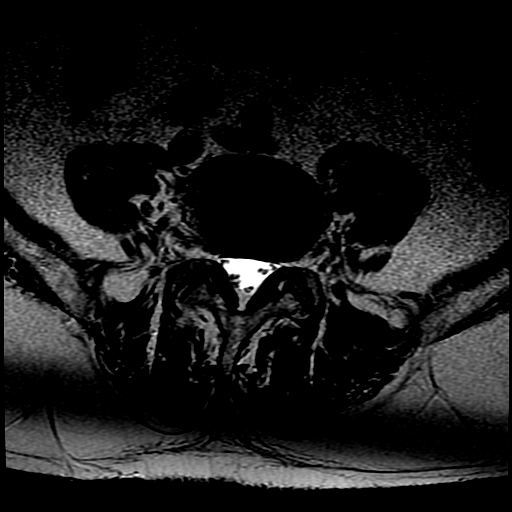
[im 19/38]
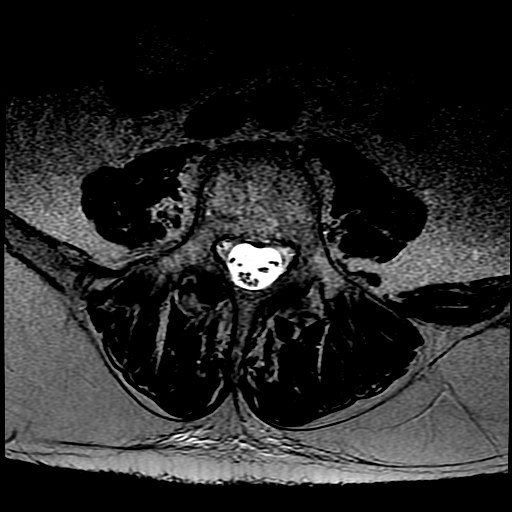
[im 22/38]
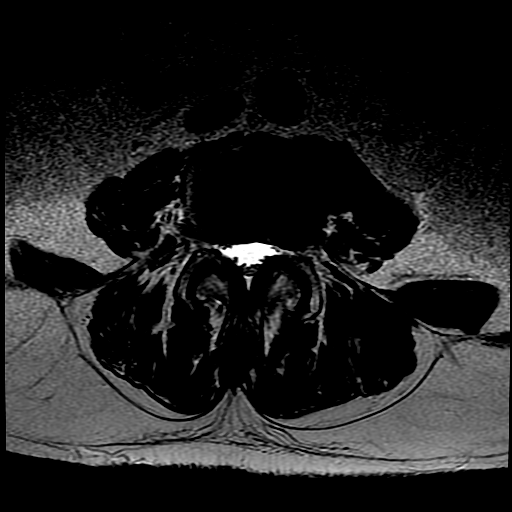
[im 32/38]
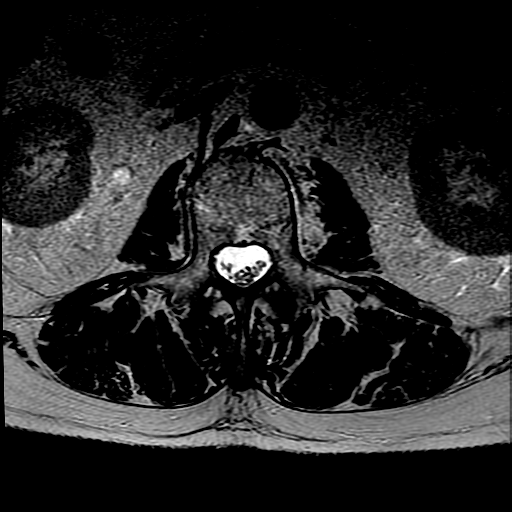

[19 of 48 positions shown; findings below may reference images not displayed]

FINDINGS: Segmentation:  Standard.

Alignment:  Mild lumbar dextroscoliosis. No listhesis.

Vertebrae: Partially visualized chronic T11 compression fracture. No
lumbar spine fracture, significant marrow edema, or evidence of
discitis. Moderately heterogeneous bone marrow signal diffusely
without a suspicious focal lesion identified.

Conus medullaris and cauda equina: Conus extends to the T12 level.
Conus and cauda equina appear normal.

Paraspinal and other soft tissues: Unremarkable.

Disc levels:

Disc desiccation throughout the lumbar spine. Moderate disc space
narrowing at L2-3.

L1-2: Minimal disc bulging, endplate spurring, and mild facet and
ligamentum flavum hypertrophy without significant stenosis.

L2-3: Circumferential disc bulging, endplate spurring, disc space
height loss, and mild to moderate left greater than right facet and
ligamentum flavum hypertrophy result in borderline spinal stenosis,
mild bilateral lateral recess stenosis, and mild left greater than
right neural foraminal stenosis.

L3-4: Disc bulging eccentric to the left and moderate facet and
ligamentum flavum hypertrophy result in mild left greater than right
neural foraminal stenosis and borderline spinal stenosis.

L4-5: Mild disc bulging and moderate facet and ligamentum flavum
hypertrophy without significant stenosis.

L5-S1: Mild right eccentric disc bulging and mild facet hypertrophy
without stenosis.
IMPRESSION: 1. Disc degeneration greatest at L2-3 where there is mild lateral
recess and neural foraminal stenosis.
2. Mild neural foraminal stenosis at L3-4.

## 2019-12-01 NOTE — Progress Notes (Signed)
Changed device settings for MRI to AOO at 90 bpm per orders    Will Program device back to pre-MRI settings after completion of exam.

## 2019-12-01 NOTE — Progress Notes (Signed)
Informed of MRI for today.   Device system confirmed to be MRI conditional, with implant date > 6 weeks ago and no evidence of abandoned or epicardial leads in review of most recent Chest CT/CXR Interrogation from today reviewed, pt is currently AP-VS at 75 bpm Change device settings for MRI to AOO at 90 bpm  Tachy-therapies to off if applicable.  Program device back to pre-MRI settings after completion of exam.  Christopher [name]  12/01/2019 12:37 PM

## 2019-12-02 ENCOUNTER — Telehealth: Payer: Self-pay

## 2019-12-02 NOTE — Telephone Encounter (Signed)
Please counsel him to stop Eliquis for 2 days and then restart it. If he develops signs of clot retention (thick, ketchup-like urinary output; passage of large clots; dark, maroon-colored urine; lower abdominal pain; lumbar pain; abdominal distention; and the inability to urinate), he should call us immediately or proceed to the ED. Please plan to call him back in 2 days to see if hematuria has cleared.

## 2019-12-02 NOTE — Telephone Encounter (Signed)
Incoming call from pt, he requests that Dickinson call him back. Pt refused to tell me what about and declined my assistance.

## 2019-12-02 NOTE — Telephone Encounter (Signed)
Returned call, patient has ongoing hematuria has not improved, has darkened since yesterday. Concerned, since it has not improved. States no other symptoms.

## 2019-12-02 NOTE — Telephone Encounter (Signed)
Patient informed, voiced understanding.  °

## 2019-12-04 NOTE — Telephone Encounter (Signed)
Called patient, He stated he has not urinated this morning so unsure of hematuria. Patient will call me back when he urinates

## 2019-12-04 NOTE — Telephone Encounter (Signed)
Patient called back, still having blood, urine is not as dark and does not have clots. Christopher [name] stopped ASA, Eliquis and Omega 3 two days ago. As per Sam, she will speak to Dr. Bernardo Heater and see what the next step is.

## 2019-12-04 NOTE — Telephone Encounter (Signed)
As per Dr. Bernardo Heater, patient will hold ASA, Eliquis, and Omega 3 for one more day. If patient still having hematuria tomorrow 12/05/2019, patient will need to schedule cysto with Dr Bernardo Heater. Patient understood. We will have to call patient tomorrow morning to see how he is doing

## 2019-12-05 ENCOUNTER — Ambulatory Visit: Payer: Self-pay | Admitting: Physician Assistant

## 2019-12-05 NOTE — Telephone Encounter (Signed)
Spoke with patient still having ongoing hematuria, unable to start BCG. advised per Debroah Loop, PA to restart Eliquis and keep cysto appointment with Dr. Bernardo Heater. Patient verbalized understanding.  Aware of any pain or other urinary symptoms to seek medical attention.

## 2019-12-10 ENCOUNTER — Other Ambulatory Visit: Payer: Self-pay

## 2019-12-10 ENCOUNTER — Ambulatory Visit: Payer: Medicare HMO | Admitting: Urology

## 2019-12-10 VITALS — BP 143/67 | HR 77 | Ht 71.0 in | Wt 195.0 lb

## 2019-12-10 DIAGNOSIS — R319 Hematuria, unspecified: Secondary | ICD-10-CM

## 2019-12-10 DIAGNOSIS — D494 Neoplasm of unspecified behavior of bladder: Secondary | ICD-10-CM | POA: Diagnosis not present

## 2019-12-10 LAB — URINALYSIS, COMPLETE
Bilirubin, UA: NEGATIVE
Glucose, UA: NEGATIVE
Nitrite, UA: POSITIVE — AB
Specific Gravity, UA: >1.03 — ABNORMAL HIGH (ref 1.005–1.030)
Urobilinogen, Ur: 0.2 mg/dL (ref 0.2–1.0)
pH, UA: 5.5 (ref 5.0–7.5)

## 2019-12-10 LAB — MICROSCOPIC EXAMINATION
RBC, Urine: >30 /hpf — AB (ref 0–2)
WBC, UA: >30 /hpf — AB (ref 0–5)

## 2019-12-10 NOTE — Progress Notes (Signed)
   12/10/2019  CC:  Chief Complaint  Patient presents with  . Cysto   Urologic History: -Surg path reviewed, high grade urothelial carcinoma with lamina propria invasion and carcinoma in situ -Has been unable to start BCG secondary to persistent gross hematuria -Positive urine culture 5/14 and states he has had no recurrent hematuria in the last 10 days -We will schedule for cystoscopy due to persistent hematuria  HPI: Christopher [name] is a 86 year old male who presents today for a cystoscopy.  -Urinalysis with pyuria, (a >30 WBC) and was nitrite positive   Blood pressure (!) 143/67, pulse 77, height 5\' 11"  (1.803 m), weight 195 lb (88.5 kg).  -Cystoscopy was not performed due to pyuria and nitrite positive urine -Will cancel scheduled BCG for 6/4  I, Kyla Peace, am acting as a Education administrator for Dr. Nicki Reaper C. Jisele Price.  I have reviewed the above documentation for accuracy and completeness, and I agree with the above.   Abbie Sons, MD

## 2019-12-12 ENCOUNTER — Ambulatory Visit: Payer: Self-pay | Admitting: Physician Assistant

## 2019-12-16 ENCOUNTER — Telehealth: Payer: Self-pay | Admitting: *Deleted

## 2019-12-16 LAB — CULTURE, URINE COMPREHENSIVE

## 2019-12-16 NOTE — Telephone Encounter (Signed)
-----   Message from Clancy Gourd sent at 12/16/2019 11:37 AM EDT ----- Regarding: Appt Request Hello,  I just uploaded a [place] into this pt's chart in Epic under the Media tab. It shows that he is an established pt of Dr. Aletha Halim, last seen on 09/12/19. Can you call the pt to schedule an appt? Thanks.

## 2019-12-16 NOTE — Telephone Encounter (Signed)
Received referral from Dr. Dorthula Perfect requesting that the patient return to see Dr. Jacinto Reap sooner than apts in September. hgb 8.5 @ [facility]. Dr. B please advise.

## 2019-12-17 NOTE — Telephone Encounter (Signed)
Dr. B would like to see this patient at 845 am tomorrow. Patient contacted and accepted this apt time. Colette, Can ou add pt to Dr. Sharmaine Base schedule.

## 2019-12-17 NOTE — Telephone Encounter (Signed)
Dr. Jacinto Reap - please advise - when to add patient to schedule and what labs to draw.

## 2019-12-18 ENCOUNTER — Other Ambulatory Visit: Payer: Self-pay

## 2019-12-18 ENCOUNTER — Inpatient Hospital Stay: Payer: Medicare HMO

## 2019-12-18 ENCOUNTER — Inpatient Hospital Stay: Payer: Medicare HMO | Attending: Internal Medicine | Admitting: Internal Medicine

## 2019-12-18 ENCOUNTER — Telehealth: Payer: Self-pay | Admitting: *Deleted

## 2019-12-18 DIAGNOSIS — E785 Hyperlipidemia, unspecified: Secondary | ICD-10-CM | POA: Diagnosis not present

## 2019-12-18 DIAGNOSIS — D500 Iron deficiency anemia secondary to blood loss (chronic): Secondary | ICD-10-CM

## 2019-12-18 DIAGNOSIS — C679 Malignant neoplasm of bladder, unspecified: Secondary | ICD-10-CM | POA: Insufficient documentation

## 2019-12-18 DIAGNOSIS — Z79899 Other long term (current) drug therapy: Secondary | ICD-10-CM | POA: Diagnosis not present

## 2019-12-18 DIAGNOSIS — I4891 Unspecified atrial fibrillation: Secondary | ICD-10-CM | POA: Diagnosis not present

## 2019-12-18 DIAGNOSIS — R5383 Other fatigue: Secondary | ICD-10-CM | POA: Insufficient documentation

## 2019-12-18 DIAGNOSIS — Z950 Presence of cardiac pacemaker: Secondary | ICD-10-CM | POA: Insufficient documentation

## 2019-12-18 DIAGNOSIS — Z7982 Long term (current) use of aspirin: Secondary | ICD-10-CM | POA: Insufficient documentation

## 2019-12-18 DIAGNOSIS — R5381 Other malaise: Secondary | ICD-10-CM | POA: Insufficient documentation

## 2019-12-18 DIAGNOSIS — K669 Disorder of peritoneum, unspecified: Secondary | ICD-10-CM

## 2019-12-18 DIAGNOSIS — N400 Enlarged prostate without lower urinary tract symptoms: Secondary | ICD-10-CM | POA: Insufficient documentation

## 2019-12-18 DIAGNOSIS — K668 Other specified disorders of peritoneum: Secondary | ICD-10-CM

## 2019-12-18 DIAGNOSIS — M8580 Other specified disorders of bone density and structure, unspecified site: Secondary | ICD-10-CM | POA: Diagnosis not present

## 2019-12-18 DIAGNOSIS — Z85828 Personal history of other malignant neoplasm of skin: Secondary | ICD-10-CM | POA: Diagnosis not present

## 2019-12-18 DIAGNOSIS — R0602 Shortness of breath: Secondary | ICD-10-CM | POA: Diagnosis not present

## 2019-12-18 DIAGNOSIS — I10 Essential (primary) hypertension: Secondary | ICD-10-CM | POA: Insufficient documentation

## 2019-12-18 DIAGNOSIS — Z7901 Long term (current) use of anticoagulants: Secondary | ICD-10-CM | POA: Insufficient documentation

## 2019-12-18 LAB — BASIC METABOLIC PANEL
Anion gap: 7 (ref 5–15)
BUN: 13 mg/dL (ref 8–23)
CO2: 28 mmol/L (ref 22–32)
Calcium: 8.9 mg/dL (ref 8.9–10.3)
Chloride: 105 mmol/L (ref 98–111)
Creatinine, Ser: 0.92 mg/dL (ref 0.61–1.24)
GFR calc Af Amer: >60 mL/min (ref >60)
GFR calc non Af Amer: >60 mL/min (ref >60)
Glucose, Bld: 103 mg/dL — ABNORMAL HIGH (ref 70–99)
Potassium: 4.1 mmol/L (ref 3.5–5.1)
Sodium: 140 mmol/L (ref 135–145)

## 2019-12-18 LAB — CBC WITH DIFFERENTIAL/PLATELET
Abs Immature Granulocytes: 0.01 10*3/uL (ref 0.00–0.07)
Basophils Absolute: 0 10*3/uL (ref 0.0–0.1)
Basophils Relative: 1 %
Eosinophils Absolute: 0.2 10*3/uL (ref 0.0–0.5)
Eosinophils Relative: 4 %
HCT: 31.3 % — ABNORMAL LOW (ref 39.0–52.0)
Hemoglobin: 9.6 g/dL — ABNORMAL LOW (ref 13.0–17.0)
Immature Granulocytes: 0 %
Lymphocytes Relative: 19 %
Lymphs Abs: 1 10*3/uL (ref 0.7–4.0)
MCH: 28.6 pg (ref 26.0–34.0)
MCHC: 30.7 g/dL (ref 30.0–36.0)
MCV: 93.2 fL (ref 80.0–100.0)
Monocytes Absolute: 0.8 10*3/uL (ref 0.1–1.0)
Monocytes Relative: 15 %
Neutro Abs: 3.2 10*3/uL (ref 1.7–7.7)
Neutrophils Relative %: 61 %
Platelets: 231 10*3/uL (ref 150–400)
RBC: 3.36 MIL/uL — ABNORMAL LOW (ref 4.22–5.81)
RDW: 20.7 % — ABNORMAL HIGH (ref 11.5–15.5)
WBC: 5.3 10*3/uL (ref 4.0–10.5)
nRBC: 0 % (ref 0.0–0.2)

## 2019-12-18 LAB — LACTATE DEHYDROGENASE: LDH: 109 U/L (ref 98–192)

## 2019-12-18 NOTE — Telephone Encounter (Signed)
-----   Message from Cammie Sickle, MD sent at 12/18/2019 10:31 AM EDT ----- H/T-please inform patient that his hemoglobin is improved at 9.6 [previously with PCP 8.5]; likely from iron pills. Also informed that iron infusion should make him feel better/faster. Plan with iron infusions next week as planned.  GB

## 2019-12-18 NOTE — Telephone Encounter (Signed)
Spoke with patient. Plan of care and today's lab results reviewed with patient. Pt gave verbal understanding.

## 2019-12-18 NOTE — Progress Notes (Signed)
[facility] NOTE  Patient Care Team: Ezequiel Kayser, MD as PCP - General (Internal Medicine)  CHIEF COMPLAINTS/PURPOSE OF CONSULTATION: Peritoneal thickening/IDA  Oncology History Overview Note  # NOV 2020- [incidental/hematuria work-up]-subtle peritoneal thickening/plaque-like lesions; March 2021-CT scan progressive omental/peritoneal thickening  #Microscopic hematuria- sec to BPH [cystoscopy/CT urogram negative; Dr.Stoiff]; March 2021 CT scan-new lesion in the bladder diverticulum  # MAY-June 2021- severe IDA sec to hemauria [hb 8.5; Ferritin- ]  # on eliquis ? A.fib/pacemaker;   Urothelial carcinoma of bladder ([place])  10/29/2019 Initial Diagnosis   Urothelial carcinoma of bladder ([place])      HISTORY OF PRESENTING ILLNESS:  Christopher [name] 86 y.o.  male with a history of peritoneal/omental thickening of unclear etiology; also superficial bladder malignancy/hematuria.   In the interim patient underwent cystoscopy noted to have high-grade superficial urothelial malignancy.  Patient unable to get BCG because of hematuria/pyuria.  Patient noted to have Progressive shortness of breath; difficulty breathing especially exertion.   Denies any abdominal pain nausea vomiting or constipation.  Appetite is good.  No weight loss.  Review of Systems  Constitutional: Positive for malaise/fatigue. Negative for chills, diaphoresis, fever and weight loss.  HENT: Negative for nosebleeds and sore throat.   Eyes: Negative for double vision.  Respiratory: Positive for shortness of breath. Negative for cough, hemoptysis, sputum production and wheezing.   Cardiovascular: Negative for chest pain, palpitations, orthopnea and leg swelling.  Gastrointestinal: Negative for abdominal pain, blood in stool, constipation, diarrhea, heartburn, melena, nausea and vomiting.  Genitourinary: Negative for dysuria, frequency and urgency.  Musculoskeletal: Negative for back pain and joint pain.   Skin: Negative.  Negative for itching and rash.  Neurological: Negative for dizziness, tingling, focal weakness, weakness and headaches.  Endo/Heme/Allergies: Does not bruise/bleed easily.  Psychiatric/Behavioral: Negative for depression. The patient is not nervous/anxious and does not have insomnia.      MEDICAL HISTORY:  Past Medical History:  Diagnosis Date  . BPH (benign prostatic hyperplasia)   . Cancer ([place])    skin cancer on face carcinoma  . ED (erectile dysfunction)   . Gilbert disease   . HLD (hyperlipidemia)   . Hypertension   . Macular degeneration   . Osteopenia   . Presence of permanent cardiac pacemaker   . Sensorineural hearing loss     SURGICAL HISTORY: Past Surgical History:  Procedure Laterality Date  . CATARACT EXTRACTION    . HEMORRHOIDECTOMY WITH HEMORRHOID BANDING    . KNEE ARTHROSCOPY    . PACEMAKER INSERTION N/A 03/03/2015   Procedure: INSERTION DUAL LEAD PACEMAKER;  Surgeon: Isaias Cowman, MD;  Location: [facility] ORS;  Service: Cardiovascular;  Laterality: N/A;  . TONSILLECTOMY    . TRANSURETHRAL RESECTION OF BLADDER TUMOR N/A 10/07/2019   Procedure: TRANSURETHRAL RESECTION OF BLADDER TUMOR (TURBT);  Surgeon: Abbie Sons, MD;  Location: [facility] ORS;  Service: Urology;  Laterality: N/A;    SOCIAL HISTORY: Social History   Socioeconomic History  . Marital status: Widowed    Spouse name: Not on file  . Number of children: Not on file  . Years of education: Not on file  . Highest education level: Not on file  Occupational History  . Not on file  Tobacco Use  . Smoking status: Never Smoker  . Smokeless tobacco: Never Used  Vaping Use  . Vaping Use: Never used  Substance and Sexual Activity  . Alcohol use: Yes  . Drug use: No  . Sexual activity: Yes    Birth  control/protection: None  Other Topics Concern  . Not on file  Social History Narrative   Retd. Civil engineer, contracting; [employer]; self. Never smoked; alcohol- glass wine/drink every  night.     Social Determinants of Health   Financial Resource Strain:   . Difficulty of Paying Living Expenses:   Food Insecurity:   . Worried About Charity fundraiser in the Last Year:   . Arboriculturist in the Last Year:   Transportation Needs:   . Film/video editor (Medical):   Marland Kitchen Lack of Transportation (Non-Medical):   Physical Activity:   . Days of Exercise per Week:   . Minutes of Exercise per Session:   Stress:   . Feeling of Stress :   Social Connections:   . Frequency of Communication with Friends and Family:   . Frequency of Social Gatherings with Friends and Family:   . Attends Religious Services:   . Active Member of Clubs or Organizations:   . Attends Archivist Meetings:   Marland Kitchen Marital Status:   Intimate Partner Violence:   . Fear of Current or Ex-Partner:   . Emotionally Abused:   Marland Kitchen Physically Abused:   . Sexually Abused:     FAMILY HISTORY: Family History  Problem Relation Age of Onset  . Diabetes Mother   . Lung cancer Father   . Cancer Paternal Uncle   . Prostate cancer Neg Hx   . Bladder Cancer Neg Hx   . Kidney cancer Neg Hx     ALLERGIES:  is allergic to atenolol.  MEDICATIONS:  Current Outpatient Medications  Medication Sig Dispense Refill  . acetaminophen (TYLENOL) 500 MG tablet Take 1,000 mg by mouth every 6 (six) hours as needed for moderate pain.     Marland Kitchen alfuzosin (UROXATRAL) 10 MG 24 hr tablet Take 1 tablet (10 mg total) by mouth daily with breakfast. 90 tablet 3  . apixaban (ELIQUIS) 5 MG TABS tablet Take 5 mg by mouth 2 (two) times daily.     Marland Kitchen aspirin EC 81 MG tablet Take 81 mg by mouth daily.     Marland Kitchen atorvastatin (LIPITOR) 10 MG tablet Take 10 mg by mouth daily.     . calcium carbonate (OSCAL) 1500 (600 Ca) MG TABS tablet Take 600 mg of elemental calcium by mouth daily with breakfast.    . cholecalciferol (VITAMIN D) 25 MCG (1000 UNIT) tablet Take 1,000 Units by mouth daily.     . ferrous fumarate (HEMOCYTE - 106 MG FE) 325  (106 Fe) MG TABS tablet Take 325 mg of iron by mouth in the morning and at bedtime.    . finasteride (PROSCAR) 5 MG tablet Take 1 tablet (5 mg total) by mouth daily. 90 tablet 3  . metoprolol succinate (TOPROL-XL) 25 MG 24 hr tablet Take 25 mg by mouth daily.    . metoprolol succinate (TOPROL-XL) 50 MG 24 hr tablet Take 50 mg by mouth daily.     . Multiple Vitamins-Minerals (MENS MULTIVITAMIN PO) Take 1 tablet by mouth daily.    . Multiple Vitamins-Minerals (PRESERVISION AREDS 2 PO) Take 1 tablet by mouth in the morning and at bedtime.    . Omega-3 Fatty Acids (FISH OIL) 1000 MG CAPS Take 1,000 mg by mouth 3 (three) times daily.      No current facility-administered medications for this visit.      Marland Kitchen  PHYSICAL EXAMINATION: ECOG PERFORMANCE STATUS: 0 - Asymptomatic  Vitals:   12/18/19 0825  BP: Marland Kitchen)  149/66  Pulse: 72   Filed Weights   12/18/19 0825  Weight: 198 lb (89.8 kg)    Physical Exam HENT:     Head: Normocephalic and atraumatic.     Mouth/Throat:     Pharynx: No oropharyngeal exudate.  Eyes:     Pupils: Pupils are equal, round, and reactive to light.  Cardiovascular:     Rate and Rhythm: Normal rate and regular rhythm.  Pulmonary:     Effort: No respiratory distress.     Breath sounds: No wheezing.  Abdominal:     General: Bowel sounds are normal. There is no distension.     Palpations: Abdomen is soft. There is no mass.     Tenderness: There is no abdominal tenderness. There is no guarding or rebound.  Musculoskeletal:        General: No tenderness. Normal range of motion.     Cervical back: Normal range of motion and neck supple.  Skin:    General: Skin is warm.  Neurological:     Mental Status: He is [name] and oriented to person, place, and time.  Psychiatric:        Mood and Affect: Affect normal.       LABORATORY DATA:  I have reviewed the data as listed Lab Results  Component Value Date   WBC 5.3 12/18/2019   HGB 9.6 (L) 12/18/2019   HCT 31.3  (L) 12/18/2019   MCV 93.2 12/18/2019   PLT 231 12/18/2019   Recent Labs    09/10/19 1400 10/03/19 0934 12/18/19 0948  NA  --  142 140  K  --  4.4 4.1  CL  --  107 105  CO2  --  28 28  GLUCOSE  --  101* 103*  BUN  --  20 13  CREATININE 1.00 0.89 0.92  CALCIUM  --  9.3 8.9  GFRNONAA  --  >60 >60  GFRAA  --  >60 >60    RADIOGRAPHIC STUDIES: I have personally reviewed the radiological images as listed and agreed with the findings in the report. MR LUMBAR SPINE WO CONTRAST  Result Date: 12/01/2019 CLINICAL DATA:  Lumbar radiculopathy. Low back pain. EXAM: MRI LUMBAR SPINE WITHOUT CONTRAST TECHNIQUE: Multiplanar, multisequence MR imaging of the lumbar spine was performed. No intravenous contrast was administered. COMPARISON:  CT abdomen and pelvis 09/10/2019 FINDINGS: Segmentation:  Standard. Alignment:  Mild lumbar dextroscoliosis. No listhesis. Vertebrae: Partially visualized chronic T11 compression fracture. No lumbar spine fracture, significant marrow edema, or evidence of discitis. Moderately heterogeneous bone marrow signal diffusely without a suspicious focal lesion identified. Conus medullaris and cauda equina: Conus extends to the T12 level. Conus and cauda equina appear normal. Paraspinal and other soft tissues: Unremarkable. Disc levels: Disc desiccation throughout the lumbar spine. Moderate disc space narrowing at L2-3. L1-2: Minimal disc bulging, endplate spurring, and mild facet and ligamentum flavum hypertrophy without significant stenosis. L2-3: Circumferential disc bulging, endplate spurring, disc space height loss, and mild to moderate left greater than right facet and ligamentum flavum hypertrophy result in borderline spinal stenosis, mild bilateral lateral recess stenosis, and mild left greater than right neural foraminal stenosis. L3-4: Disc bulging eccentric to the left and moderate facet and ligamentum flavum hypertrophy result in mild left greater than right neural  foraminal stenosis and borderline spinal stenosis. L4-5: Mild disc bulging and moderate facet and ligamentum flavum hypertrophy without significant stenosis. L5-S1: Mild right eccentric disc bulging and mild facet hypertrophy without stenosis. IMPRESSION: 1. Disc degeneration greatest  at L2-3 where there is mild lateral recess and neural foraminal stenosis. 2. Mild neural foraminal stenosis at L3-4. Electronically Signed   By: Logan Bores M.D.   On: 12/01/2019 14:24    ASSESSMENT & PLAN:   Lesion of peritoneum     Iron deficiency anemia due to chronic blood loss #Iron deficiency anemia secondary to chronic blood loss  [see below]-06/03- Hb 8.5; ferritin 15 [PCP]-currently on p.o. iron.  Recommend IV Venofer.  Discussed the potential acute infusion reactions with IV iron; which are quite rare.  Patient understands the risk; will proceed with infusions.  #Hematuria microscopic chronic-status post recent cystoscopy high-grade superficial malignancy.;  On Eliquis/A. fib.  Defer to urology y for further recommendations  # Omental/peritoneal thickening-unclear etiology; follow-up CT scan March-shows progressive omental/peritoneal thickening.  Clinically asymptomatic.  Monitor for now.   #High-grade noninvasive urothelial malignancy/hematuria-unable to get BCG intravesical treatment.  Defer to urology.   # DISPOSITION: # labs- Cbc/bmp/LDH; hold tube- please order # venofer infusion weekly x4 # follow up in 4 weeks MD; labs; cbc/bmp; Possible Venofer-Dr.B  All questions were answered. The patient knows to call the clinic with any problems, questions or concerns.    Cammie Sickle, MD 12/18/2019 4:21 PM

## 2019-12-18 NOTE — Assessment & Plan Note (Addendum)
#  Iron deficiency anemia secondary to chronic blood loss  [see below]-06/03- Hb 8.5; ferritin 15 [PCP]-currently on p.o. iron.  Recommend IV Venofer.  Discussed the potential acute infusion reactions with IV iron; which are quite rare.  Patient understands the risk; will proceed with infusions.  #Hematuria microscopic chronic-status post recent cystoscopy high-grade superficial malignancy.;  On Eliquis/A. fib.  Defer to urology y for further recommendations  # Omental/peritoneal thickening-unclear etiology; follow-up CT scan March-shows progressive omental/peritoneal thickening.  Clinically asymptomatic.  Monitor for now.   #High-grade noninvasive urothelial malignancy/hematuria-unable to get BCG intravesical treatment.  Defer to urology.   # DISPOSITION: # labs- Cbc/bmp/LDH; hold tube- please order # venofer infusion weekly x4 # follow up in 4 weeks MD; labs; cbc/bmp; Possible Venofer-Dr.B

## 2019-12-19 ENCOUNTER — Encounter: Payer: Self-pay | Admitting: Physician Assistant

## 2019-12-19 ENCOUNTER — Ambulatory Visit: Payer: Medicare HMO | Admitting: Physician Assistant

## 2019-12-19 VITALS — BP 155/77 | HR 69 | Ht 71.0 in | Wt 199.0 lb

## 2019-12-19 DIAGNOSIS — C679 Malignant neoplasm of bladder, unspecified: Secondary | ICD-10-CM

## 2019-12-19 LAB — MICROSCOPIC EXAMINATION
RBC, Urine: >30 /hpf — ABNORMAL HIGH (ref 0–2)
WBC, UA: >30 /hpf — ABNORMAL HIGH (ref 0–5)

## 2019-12-19 LAB — URINALYSIS, COMPLETE
Bilirubin, UA: NEGATIVE
Glucose, UA: NEGATIVE
Ketones, UA: NEGATIVE
Nitrite, UA: NEGATIVE
Specific Gravity, UA: >1.03 — ABNORMAL HIGH (ref 1.005–1.030)
Urobilinogen, Ur: 0.2 mg/dL (ref 0.2–1.0)
pH, UA: 5.5 (ref 5.0–7.5)

## 2019-12-19 MED ORDER — SULFAMETHOXAZOLE-TRIMETHOPRIM 800-160 MG PO TABS: 1.0000 | ORAL_TABLET | Freq: Two times a day (BID) | ORAL | 0 refills | Status: AC

## 2019-12-19 NOTE — Progress Notes (Signed)
Patient presented to clinic today for induction BCG #1 of 6.  BCG induction has been delayed secondary to persistent gross hematuria.  Gross hematuria has since resolved, however he was found to have grossly infected urine in clinic on 12/10/2019.  Urine culture ultimately resulted with ciprofloxacin, penicillin, and tetracycline resistant as well as levofloxacin intermediate Staph epi.  UA today notable for persistent pyuria and microscopic hematuria.  Will defer induction BCG installation #1 of 6 today and start the patient on culture appropriate Bactrim DS twice daily x7 days.  Counseled patient to follow-up in clinic with [place] in 1 week to initiate BCG treatments.  He expressed understanding.

## 2019-12-25 ENCOUNTER — Other Ambulatory Visit: Payer: Self-pay

## 2019-12-25 ENCOUNTER — Inpatient Hospital Stay: Payer: Medicare HMO

## 2019-12-25 VITALS — BP 108/56 | HR 65 | Temp 97.5°F | Resp 18

## 2019-12-25 DIAGNOSIS — C679 Malignant neoplasm of bladder, unspecified: Secondary | ICD-10-CM | POA: Diagnosis not present

## 2019-12-25 DIAGNOSIS — D500 Iron deficiency anemia secondary to blood loss (chronic): Secondary | ICD-10-CM

## 2019-12-25 MED ORDER — SODIUM CHLORIDE 0.9 % IV SOLN: 200.0000 mg | Freq: Once | INTRAVENOUS | Status: DC

## 2019-12-25 MED ORDER — SODIUM CHLORIDE 0.9 % IV SOLN
Freq: Once | INTRAVENOUS | Status: AC
  Filled 2019-12-25: qty 250

## 2019-12-25 MED ORDER — IRON SUCROSE 20 MG/ML IV SOLN
200.0000 mg | Freq: Once | INTRAVENOUS | Status: AC
  Administered 2019-12-25: 200 mg via INTRAVENOUS
  Filled 2019-12-25: qty 10

## 2019-12-26 ENCOUNTER — Ambulatory Visit: Payer: Medicare HMO | Admitting: Physician Assistant

## 2019-12-26 DIAGNOSIS — C679 Malignant neoplasm of bladder, unspecified: Secondary | ICD-10-CM

## 2019-12-26 MED ORDER — BCG LIVE 50 MG IS SUSR
3.2400 mL | Freq: Once | INTRAVESICAL | Status: AC
  Administered 2019-12-26: 81 mg via INTRAVESICAL

## 2019-12-26 NOTE — Progress Notes (Signed)
BCG Bladder Instillation  BCG # 1 of 6  Due to Bladder Cancer patient is present today for a BCG treatment. Patient was cleaned and prepped in a sterile fashion with betadine. A 14FR catheter was inserted, urine return was noted 21ml, urine was yellow in color.  51ml of reconstituted BCG was instilled into the bladder. The catheter was then removed. Patient tolerated well, no complications were noted  Performed by: Debroah Loop, PA-C and Verlene Mayer, CMA  Follow up/ Additional notes: Patient remained in office for 30 minutes after instillation for monitoring of hypersensitivity reaction, no reaction noted.  Patient counseled to hold treatment in his bladder for 2 hours after each instillation, perform quarter turns every 15 minutes, and use bleach in the toilet after voiding following installations.  Written instructions provided.

## 2019-12-26 NOTE — Patient Instructions (Addendum)

## 2019-12-29 LAB — URINALYSIS, COMPLETE
Bilirubin, UA: NEGATIVE
Glucose, UA: NEGATIVE
Ketones, UA: NEGATIVE
Leukocytes,UA: NEGATIVE
Nitrite, UA: NEGATIVE
Specific Gravity, UA: 1.02 (ref 1.005–1.030)
Urobilinogen, Ur: 0.2 mg/dL (ref 0.2–1.0)
pH, UA: 6 (ref 5.0–7.5)

## 2019-12-29 LAB — MICROSCOPIC EXAMINATION
Bacteria, UA: NONE SEEN
RBC, Urine: >30 /hpf — AB (ref 0–2)

## 2020-01-01 ENCOUNTER — Other Ambulatory Visit: Payer: Self-pay

## 2020-01-01 ENCOUNTER — Inpatient Hospital Stay: Payer: Medicare HMO

## 2020-01-01 VITALS — BP 156/75 | HR 60 | Temp 97.0°F | Resp 19

## 2020-01-01 DIAGNOSIS — D500 Iron deficiency anemia secondary to blood loss (chronic): Secondary | ICD-10-CM

## 2020-01-01 DIAGNOSIS — C679 Malignant neoplasm of bladder, unspecified: Secondary | ICD-10-CM | POA: Diagnosis not present

## 2020-01-01 MED ORDER — SODIUM CHLORIDE 0.9 % IV SOLN
Freq: Once | INTRAVENOUS | Status: AC
  Filled 2020-01-01: qty 250

## 2020-01-01 MED ORDER — SODIUM CHLORIDE 0.9 % IV SOLN: 200.0000 mg | Freq: Once | INTRAVENOUS | Status: DC

## 2020-01-01 MED ORDER — IRON SUCROSE 20 MG/ML IV SOLN
200.0000 mg | Freq: Once | INTRAVENOUS | Status: AC
  Administered 2020-01-01: 200 mg via INTRAVENOUS
  Filled 2020-01-01: qty 10

## 2020-01-02 ENCOUNTER — Other Ambulatory Visit: Payer: Self-pay | Admitting: Radiology

## 2020-01-02 ENCOUNTER — Ambulatory Visit: Payer: Medicare HMO | Admitting: Physician Assistant

## 2020-01-02 DIAGNOSIS — C679 Malignant neoplasm of bladder, unspecified: Secondary | ICD-10-CM

## 2020-01-02 DIAGNOSIS — R310 Gross hematuria: Secondary | ICD-10-CM

## 2020-01-02 MED ORDER — BCG LIVE 50 MG IS SUSR: 3.2400 mL | Freq: Once | INTRAVESICAL | Status: DC

## 2020-01-02 NOTE — Patient Instructions (Signed)
Amy our surgical scheduler will contact you to discuss surgery. We are targeting July 13,2021. We have canceled your remaining BCG appointments given we our now planning for surgery.

## 2020-01-02 NOTE — Progress Notes (Signed)
Patient presented to clinic today for induction BCG #2 of 6.  UA notable for >30 RBCs/hpf and urine with mild gross hematuria on visual expection.  Discussed with Dr. Bernardo Heater.  We recommend cancelling the remainder of scheduled induction BCG series and proceeding to the OR for cystoscopy under anesthesia with possible TURBT, possible bladder biopsy, possible fulguration. Discussed plan with patient; he is in agreement.

## 2020-01-06 LAB — URINALYSIS, COMPLETE
Bilirubin, UA: NEGATIVE
Glucose, UA: NEGATIVE
Ketones, UA: NEGATIVE
Leukocytes,UA: NEGATIVE
Nitrite, UA: NEGATIVE
Specific Gravity, UA: 1.025 (ref 1.005–1.030)
Urobilinogen, Ur: 0.2 mg/dL (ref 0.2–1.0)
pH, UA: 7 (ref 5.0–7.5)

## 2020-01-06 LAB — MICROSCOPIC EXAMINATION: RBC, Urine: >30 /hpf — AB (ref 0–2)

## 2020-01-08 ENCOUNTER — Other Ambulatory Visit: Payer: Self-pay

## 2020-01-08 ENCOUNTER — Inpatient Hospital Stay: Payer: Medicare HMO | Attending: Internal Medicine

## 2020-01-08 VITALS — BP 160/73 | HR 61 | Temp 97.0°F | Resp 18

## 2020-01-08 DIAGNOSIS — R5383 Other fatigue: Secondary | ICD-10-CM | POA: Insufficient documentation

## 2020-01-08 DIAGNOSIS — Z79899 Other long term (current) drug therapy: Secondary | ICD-10-CM | POA: Insufficient documentation

## 2020-01-08 DIAGNOSIS — R0602 Shortness of breath: Secondary | ICD-10-CM | POA: Diagnosis not present

## 2020-01-08 DIAGNOSIS — E785 Hyperlipidemia, unspecified: Secondary | ICD-10-CM | POA: Diagnosis not present

## 2020-01-08 DIAGNOSIS — M8580 Other specified disorders of bone density and structure, unspecified site: Secondary | ICD-10-CM | POA: Insufficient documentation

## 2020-01-08 DIAGNOSIS — Z85828 Personal history of other malignant neoplasm of skin: Secondary | ICD-10-CM | POA: Insufficient documentation

## 2020-01-08 DIAGNOSIS — Z7901 Long term (current) use of anticoagulants: Secondary | ICD-10-CM | POA: Insufficient documentation

## 2020-01-08 DIAGNOSIS — N400 Enlarged prostate without lower urinary tract symptoms: Secondary | ICD-10-CM | POA: Diagnosis not present

## 2020-01-08 DIAGNOSIS — I10 Essential (primary) hypertension: Secondary | ICD-10-CM | POA: Diagnosis not present

## 2020-01-08 DIAGNOSIS — Z950 Presence of cardiac pacemaker: Secondary | ICD-10-CM | POA: Insufficient documentation

## 2020-01-08 DIAGNOSIS — R3129 Other microscopic hematuria: Secondary | ICD-10-CM | POA: Insufficient documentation

## 2020-01-08 DIAGNOSIS — I4891 Unspecified atrial fibrillation: Secondary | ICD-10-CM | POA: Diagnosis not present

## 2020-01-08 DIAGNOSIS — C801 Malignant (primary) neoplasm, unspecified: Secondary | ICD-10-CM | POA: Insufficient documentation

## 2020-01-08 DIAGNOSIS — Z7982 Long term (current) use of aspirin: Secondary | ICD-10-CM | POA: Diagnosis not present

## 2020-01-08 DIAGNOSIS — D500 Iron deficiency anemia secondary to blood loss (chronic): Secondary | ICD-10-CM | POA: Insufficient documentation

## 2020-01-08 DIAGNOSIS — R5381 Other malaise: Secondary | ICD-10-CM | POA: Diagnosis not present

## 2020-01-08 MED ORDER — SODIUM CHLORIDE 0.9 % IV SOLN: 200.0000 mg | Freq: Once | INTRAVENOUS | Status: DC

## 2020-01-08 MED ORDER — IRON SUCROSE 20 MG/ML IV SOLN
200.0000 mg | Freq: Once | INTRAVENOUS | Status: AC
  Administered 2020-01-08: 200 mg via INTRAVENOUS
  Filled 2020-01-08: qty 10

## 2020-01-08 MED ORDER — SODIUM CHLORIDE 0.9 % IV SOLN
Freq: Once | INTRAVENOUS | Status: AC
  Filled 2020-01-08: qty 250

## 2020-01-09 ENCOUNTER — Ambulatory Visit: Payer: Self-pay | Admitting: Physician Assistant

## 2020-01-13 ENCOUNTER — Other Ambulatory Visit: Payer: Medicare HMO

## 2020-01-13 ENCOUNTER — Other Ambulatory Visit: Payer: Self-pay | Admitting: Family Medicine

## 2020-01-13 ENCOUNTER — Other Ambulatory Visit: Payer: Self-pay

## 2020-01-13 DIAGNOSIS — C679 Malignant neoplasm of bladder, unspecified: Secondary | ICD-10-CM

## 2020-01-14 ENCOUNTER — Encounter
Admission: RE | Admit: 2020-01-14 | Discharge: 2020-01-14 | Disposition: A | Discharge status: Home / Self Care | Payer: Medicare HMO | Source: Ambulatory Visit | Attending: Urology | Admitting: Urology

## 2020-01-14 LAB — CBC WITH DIFFERENTIAL/PLATELET
Basophils Absolute: 0 10*3/uL (ref 0.0–0.2)
Basos: 1 %
EOS (ABSOLUTE): 0.1 10*3/uL (ref 0.0–0.4)
Eos: 3 %
Hematocrit: 39.5 % (ref 37.5–51.0)
Hemoglobin: 13.2 g/dL (ref 13.0–17.7)
Immature Grans (Abs): 0 10*3/uL (ref 0.0–0.1)
Immature Granulocytes: 0 %
Lymphocytes Absolute: 1.1 10*3/uL (ref 0.7–3.1)
Lymphs: 20 %
MCH: 30.2 pg (ref 26.6–33.0)
MCHC: 33.4 g/dL (ref 31.5–35.7)
MCV: 90 fL (ref 79–97)
Monocytes Absolute: 0.6 10*3/uL (ref 0.1–0.9)
Monocytes: 11 %
Neutrophils Absolute: 3.6 10*3/uL (ref 1.4–7.0)
Neutrophils: 65 %
Platelets: 222 10*3/uL (ref 150–450)
RBC: 4.37 x10E6/uL (ref 4.14–5.80)
RDW: 17.1 % — ABNORMAL HIGH (ref 11.6–15.4)
WBC: 5.5 10*3/uL (ref 3.4–10.8)

## 2020-01-14 LAB — MICROSCOPIC EXAMINATION: RBC, Urine: >30 /hpf — AB (ref 0–2)

## 2020-01-14 LAB — BASIC METABOLIC PANEL
BUN/Creatinine Ratio: 19 (ref 10–24)
BUN: 18 mg/dL (ref 8–27)
CO2: 26 mmol/L (ref 20–29)
Calcium: 9.6 mg/dL (ref 8.6–10.2)
Chloride: 102 mmol/L (ref 96–106)
Creatinine, Ser: 0.97 mg/dL (ref 0.76–1.27)
GFR calc Af Amer: 80 mL/min/{1.73_m2} (ref >59)
GFR calc non Af Amer: 69 mL/min/{1.73_m2} (ref >59)
Glucose: 92 mg/dL (ref 65–99)
Potassium: 4.6 mmol/L (ref 3.5–5.2)
Sodium: 141 mmol/L (ref 134–144)

## 2020-01-14 LAB — URINALYSIS, COMPLETE
Bilirubin, UA: NEGATIVE
Glucose, UA: NEGATIVE
Ketones, UA: NEGATIVE
Leukocytes,UA: NEGATIVE
Nitrite, UA: NEGATIVE
Specific Gravity, UA: 1.02 (ref 1.005–1.030)
Urobilinogen, Ur: 0.2 mg/dL (ref 0.2–1.0)
pH, UA: 7.5 (ref 5.0–7.5)

## 2020-01-14 NOTE — Patient Instructions (Signed)
COVID TESTING Date: January 16, 2020 Testing site:  [facility] [address] Thru Hours:  7:12 am - 1:00 pm Once you are tested, you are asked to stay quarantined (avoiding public places) until after your surgery.   Your procedure is scheduled on: 01-20-2020 TUESDAY Report to Day Surgery on the 2nd floor of the [facility]. To find out your arrival time, please call [phone] between 1PM - 3PM on: Monday 01-19-2020  REMEMBER: Instructions that are not followed completely may result in serious medical risk, up to and including death; or upon the discretion of your surgeon and anesthesiologist your surgery may need to be rescheduled.  Do not eat food after midnight the night before surgery.  No gum chewing, lozengers or hard candies.  You may however, drink CLEAR liquids up to 2 hours before you are scheduled to arrive for your surgery. Do not drink anything within 2 hours of your scheduled arrival time.  Clear liquids include: - water  - apple juice without pulp - gatorade (not RED) - black coffee or tea (Do NOT add milk or creamers to the coffee or tea) Do NOT drink anything that is not on this list.  Type 1 and Type 2 diabetics should only drink water.  TAKE THESE MEDICATIONS THE MORNING OF SURGERY WITH A SIP OF WATER: METOPROLOL FINASTERIDE ATORVASTATIN  STOP ELIQUIS ON January 17, 2020 AS INSTRUCTED.  Stop Anti-inflammatories (NSAIDS) such as Advil, Aleve, Ibuprofen, Motrin, Naproxen, Naprosyn and Aspirin based products such as Excedrin, Goodys Powder, BC Powder. (May take Tylenol or Acetaminophen if needed.)  Stop ANY OVER THE COUNTER supplements until after surgery. STOP FISH OIL. (May continue Vitamin D, Vitamin B, and multivitamin.)  No Alcohol for 24 hours before or after surgery.  No Smoking including e-cigarettes for 24 hours prior to surgery.  No chewable tobacco products for at least 6 hours prior to surgery.  No nicotine patches on  the day of surgery.  Do not use any "recreational" drugs for at least a week prior to your surgery.  Please be advised that the combination of cocaine and anesthesia may have negative outcomes, up to and including death. If you test positive for cocaine, your surgery will be cancelled.  On the morning of surgery brush your teeth with toothpaste and water, you may rinse your mouth with mouthwash if you wish. Do not swallow any toothpaste or mouthwash.  Do not wear jewelry, make-up, hairpins, clips or nail polish.  Do not wear lotions, powders, or perfumes AFTER SHAVE  Do not shave 48 hours prior to surgery.   Contact lenses, hearing aids and dentures may not be worn into surgery.  Do not bring valuables to the hospital. [facility] is not responsible for any missing/lost belongings or valuables.   SHOWER DAY OF SURGERY.  Notify your doctor if there is any change in your medical condition (cold, fever, infection).  Wear comfortable clothing (specific to your surgery type) to the hospital.  Plan for stool softeners for home use; pain medications have a tendency to cause constipation. You can also help prevent constipation by eating foods high in fiber such as fruits and vegetables and drinking plenty of fluids as your diet allows.  After surgery, you can help prevent lung complications by doing breathing exercises.  Take deep breaths and cough every 1-2 hours. Your doctor may order a device called an Incentive Spirometer to help you take deep breaths. When coughing or sneezing, hold a pillow  firmly against your incision with both hands. This is called "splinting." Doing this helps protect your incision. It also decreases belly discomfort.  If you are being discharged the day of surgery, you will not be allowed to drive home. You will need a responsible adult (18 years or older) to drive you home and stay with you that night.    Please call the [facility] at [phone] if you have any questions about these instructions.  Visitation Policy:  Patients undergoing a surgery or procedure may have one family member or support person with them as long as that person is not COVID-19 positive or experiencing its symptoms.  That person may remain in the waiting area during the procedure.  Children under 14 years of age may have both parents or legal guardians with them during their procedure.  Inpatient Visitation Update:   Two designated support people may visit a patient during visiting hours 7 am to 8 pm. It must be the same two designated people for the duration of the patient stay. The visitors may come and go during the day, and there is no switching out to have different visitors. A mask must be worn at all times, including in the patient room.  Children under 33 years of age:  a total of 4 designated visitors for the child's entire stay are allowed. Only 2 in the room at a time and only one staying overnight at a time. The overnight guest can now rotate during the child's hospital stay.  As a reminder, masks are still required for all [facility] team members, patients and visitors in all [place] facilities.   Systemwide, no visitors 17 or younger.

## 2020-01-15 LAB — CULTURE, URINE COMPREHENSIVE

## 2020-01-16 ENCOUNTER — Inpatient Hospital Stay: Payer: Medicare HMO

## 2020-01-16 ENCOUNTER — Other Ambulatory Visit
Admission: RE | Admit: 2020-01-16 | Discharge: 2020-01-16 | Disposition: A | Discharge status: Home / Self Care | Payer: Medicare HMO | Source: Ambulatory Visit | Attending: Urology | Admitting: Urology

## 2020-01-16 ENCOUNTER — Inpatient Hospital Stay (HOSPITAL_BASED_OUTPATIENT_CLINIC_OR_DEPARTMENT_OTHER): Payer: Medicare HMO | Admitting: Internal Medicine

## 2020-01-16 ENCOUNTER — Ambulatory Visit: Payer: Self-pay

## 2020-01-16 ENCOUNTER — Other Ambulatory Visit: Payer: Self-pay

## 2020-01-16 ENCOUNTER — Encounter: Payer: Self-pay | Admitting: Internal Medicine

## 2020-01-16 VITALS — BP 151/77 | HR 61 | Resp 18

## 2020-01-16 DIAGNOSIS — D500 Iron deficiency anemia secondary to blood loss (chronic): Secondary | ICD-10-CM | POA: Diagnosis not present

## 2020-01-16 DIAGNOSIS — C801 Malignant (primary) neoplasm, unspecified: Secondary | ICD-10-CM | POA: Diagnosis not present

## 2020-01-16 DIAGNOSIS — Z01812 Encounter for preprocedural laboratory examination: Secondary | ICD-10-CM | POA: Insufficient documentation

## 2020-01-16 DIAGNOSIS — Z20822 Contact with and (suspected) exposure to covid-19: Secondary | ICD-10-CM | POA: Insufficient documentation

## 2020-01-16 DIAGNOSIS — I4891 Unspecified atrial fibrillation: Secondary | ICD-10-CM | POA: Diagnosis not present

## 2020-01-16 DIAGNOSIS — R310 Gross hematuria: Secondary | ICD-10-CM | POA: Diagnosis present

## 2020-01-16 DIAGNOSIS — Z79899 Other long term (current) drug therapy: Secondary | ICD-10-CM | POA: Diagnosis not present

## 2020-01-16 DIAGNOSIS — E785 Hyperlipidemia, unspecified: Secondary | ICD-10-CM | POA: Diagnosis not present

## 2020-01-16 DIAGNOSIS — Z7901 Long term (current) use of anticoagulants: Secondary | ICD-10-CM | POA: Diagnosis not present

## 2020-01-16 DIAGNOSIS — H905 Unspecified sensorineural hearing loss: Secondary | ICD-10-CM | POA: Diagnosis not present

## 2020-01-16 DIAGNOSIS — Z950 Presence of cardiac pacemaker: Secondary | ICD-10-CM | POA: Diagnosis not present

## 2020-01-16 DIAGNOSIS — I10 Essential (primary) hypertension: Secondary | ICD-10-CM | POA: Diagnosis not present

## 2020-01-16 DIAGNOSIS — Z7982 Long term (current) use of aspirin: Secondary | ICD-10-CM | POA: Diagnosis not present

## 2020-01-16 DIAGNOSIS — C679 Malignant neoplasm of bladder, unspecified: Secondary | ICD-10-CM | POA: Diagnosis not present

## 2020-01-16 DIAGNOSIS — N400 Enlarged prostate without lower urinary tract symptoms: Secondary | ICD-10-CM | POA: Diagnosis not present

## 2020-01-16 LAB — BASIC METABOLIC PANEL
Anion gap: 8 (ref 5–15)
BUN: 24 mg/dL — ABNORMAL HIGH (ref 8–23)
CO2: 30 mmol/L (ref 22–32)
Calcium: 9.1 mg/dL (ref 8.9–10.3)
Chloride: 103 mmol/L (ref 98–111)
Creatinine, Ser: 1.05 mg/dL (ref 0.61–1.24)
GFR calc Af Amer: >60 mL/min (ref >60)
GFR calc non Af Amer: >60 mL/min (ref >60)
Glucose, Bld: 110 mg/dL — ABNORMAL HIGH (ref 70–99)
Potassium: 3.7 mmol/L (ref 3.5–5.1)
Sodium: 141 mmol/L (ref 135–145)

## 2020-01-16 LAB — CBC WITH DIFFERENTIAL/PLATELET
Abs Immature Granulocytes: 0.03 10*3/uL (ref 0.00–0.07)
Basophils Absolute: 0 10*3/uL (ref 0.0–0.1)
Basophils Relative: 0 %
Eosinophils Absolute: 0 10*3/uL (ref 0.0–0.5)
Eosinophils Relative: 0 %
HCT: 38.7 % — ABNORMAL LOW (ref 39.0–52.0)
Hemoglobin: 12.7 g/dL — ABNORMAL LOW (ref 13.0–17.0)
Immature Granulocytes: 0 %
Lymphocytes Relative: 15 %
Lymphs Abs: 1.1 10*3/uL (ref 0.7–4.0)
MCH: 30.1 pg (ref 26.0–34.0)
MCHC: 32.8 g/dL (ref 30.0–36.0)
MCV: 91.7 fL (ref 80.0–100.0)
Monocytes Absolute: 0.8 10*3/uL (ref 0.1–1.0)
Monocytes Relative: 11 %
Neutro Abs: 5.5 10*3/uL (ref 1.7–7.7)
Neutrophils Relative %: 74 %
Platelets: 201 10*3/uL (ref 150–400)
RBC: 4.22 MIL/uL (ref 4.22–5.81)
RDW: 19.9 % — ABNORMAL HIGH (ref 11.5–15.5)
WBC: 7.5 10*3/uL (ref 4.0–10.5)
nRBC: 0 % (ref 0.0–0.2)

## 2020-01-16 MED ORDER — SODIUM CHLORIDE 0.9 % IV SOLN
Freq: Once | INTRAVENOUS | Status: AC
  Filled 2020-01-16: qty 250

## 2020-01-16 MED ORDER — IRON SUCROSE 20 MG/ML IV SOLN
200.0000 mg | Freq: Once | INTRAVENOUS | Status: AC
  Administered 2020-01-16: 200 mg via INTRAVENOUS
  Filled 2020-01-16: qty 10

## 2020-01-16 MED ORDER — SODIUM CHLORIDE 0.9 % IV SOLN: 200.0000 mg | Freq: Once | INTRAVENOUS | Status: DC

## 2020-01-16 NOTE — Progress Notes (Signed)
Christopher [name] NOTE  Patient Care Team: Ezequiel Kayser, MD as PCP - General (Internal Medicine)  CHIEF COMPLAINTS/PURPOSE OF CONSULTATION: Peritoneal thickening/IDA  Oncology History Overview Note  # NOV 2020- [incidental/hematuria work-up]-subtle peritoneal thickening/plaque-like lesions; March 2021-CT scan progressive omental/peritoneal thickening  #Microscopic hematuria- sec to BPH [cystoscopy/CT urogram negative; Dr.Stoiff]; March 2021 CT scan-new lesion in the bladder diverticulum  # MAY-June 2021- severe IDA sec to hemauria [hb 8.5; Ferritin- ]  # on eliquis ? A.fib/pacemaker;   Urothelial carcinoma of bladder ([place])  10/29/2019 Initial Diagnosis   Urothelial carcinoma of bladder ([place])      HISTORY OF PRESENTING ILLNESS:  Christopher [name] 86 y.o.  male with a history of peritoneal/omental thickening of unclear etiology; also superficial bladder malignancy/hematuria.   Patient continues to have blood in urine.  Awaiting repeat cystoscopy next week for cautery.  Otherwise patient noted to have significant provement of his energy levels on IV iron.  Denies any blood in stools or black or stools.   Review of Systems  Constitutional: Positive for malaise/fatigue. Negative for chills, diaphoresis, fever and weight loss.  HENT: Negative for nosebleeds and sore throat.   Eyes: Negative for double vision.  Respiratory: Positive for shortness of breath. Negative for cough, hemoptysis, sputum production and wheezing.   Cardiovascular: Negative for chest pain, palpitations, orthopnea and leg swelling.  Gastrointestinal: Negative for abdominal pain, blood in stool, constipation, diarrhea, heartburn, melena, nausea and vomiting.  Genitourinary: Positive for hematuria. Negative for dysuria, frequency and urgency.  Musculoskeletal: Negative for back pain and joint pain.  Skin: Negative.  Negative for itching and rash.  Neurological: Negative for dizziness, tingling,  focal weakness, weakness and headaches.  Endo/Heme/Allergies: Does not bruise/bleed easily.  Psychiatric/Behavioral: Negative for depression. The patient is not nervous/anxious and does not have insomnia.      MEDICAL HISTORY:  Past Medical History:  Diagnosis Date  . BPH (benign prostatic hyperplasia)   . Cancer ([place])    skin cancer on face carcinoma  . ED (erectile dysfunction)   . Gilbert disease   . HLD (hyperlipidemia)   . Hypertension   . Macular degeneration   . Osteopenia   . Presence of permanent cardiac pacemaker   . Sensorineural hearing loss     SURGICAL HISTORY: Past Surgical History:  Procedure Laterality Date  . CATARACT EXTRACTION Bilateral   . HEMORRHOIDECTOMY WITH HEMORRHOID BANDING    . KNEE ARTHROSCOPY Left   . PACEMAKER INSERTION N/A 03/03/2015   Procedure: INSERTION DUAL LEAD PACEMAKER;  Surgeon: Isaias Cowman, MD;  Location: [facility] ORS;  Service: Cardiovascular;  Laterality: N/A;  . TONSILLECTOMY    . TRANSURETHRAL RESECTION OF BLADDER TUMOR N/A 10/07/2019   Procedure: TRANSURETHRAL RESECTION OF BLADDER TUMOR (TURBT);  Surgeon: Abbie Sons, MD;  Location: [facility] ORS;  Service: Urology;  Laterality: N/A;    SOCIAL HISTORY: Social History   Socioeconomic History  . Marital status: Widowed    Spouse name: Not on file  . Number of children: Not on file  . Years of education: Not on file  . Highest education level: Not on file  Occupational History  . Not on file  Tobacco Use  . Smoking status: Never Smoker  . Smokeless tobacco: Never Used  Vaping Use  . Vaping Use: Never used  Substance and Sexual Activity  . Alcohol use: Yes    Alcohol/week: 7.0 standard drinks    Types: 7 Glasses of wine per week  . Drug use: No  .  Sexual activity: Yes    Birth control/protection: None  Other Topics Concern  . Not on file  Social History Narrative   Retd. Civil engineer, contracting; [employer]; self. Never smoked; alcohol- glass wine/drink every night.      Social Determinants of Health   Financial Resource Strain:   . Difficulty of Paying Living Expenses:   Food Insecurity:   . Worried About Charity fundraiser in the Last Year:   . Arboriculturist in the Last Year:   Transportation Needs:   . Film/video editor (Medical):   Marland Kitchen Lack of Transportation (Non-Medical):   Physical Activity:   . Days of Exercise per Week:   . Minutes of Exercise per Session:   Stress:   . Feeling of Stress :   Social Connections:   . Frequency of Communication with Friends and Family:   . Frequency of Social Gatherings with Friends and Family:   . Attends Religious Services:   . Active Member of Clubs or Organizations:   . Attends Archivist Meetings:   Marland Kitchen Marital Status:   Intimate Partner Violence:   . Fear of Current or Ex-Partner:   . Emotionally Abused:   Marland Kitchen Physically Abused:   . Sexually Abused:     FAMILY HISTORY: Family History  Problem Relation Age of Onset  . Diabetes Mother   . Lung cancer Father   . Cancer Paternal Uncle   . Prostate cancer Neg Hx   . Bladder Cancer Neg Hx   . Kidney cancer Neg Hx     ALLERGIES:  is allergic to atenolol.  MEDICATIONS:  Current Outpatient Medications  Medication Sig Dispense Refill  . acetaminophen (TYLENOL) 500 MG tablet Take 1,000 mg by mouth every 6 (six) hours as needed for moderate pain.     Marland Kitchen alfuzosin (UROXATRAL) 10 MG 24 hr tablet Take 1 tablet (10 mg total) by mouth daily with breakfast. 90 tablet 3  . apixaban (ELIQUIS) 5 MG TABS tablet Take 5 mg by mouth 2 (two) times daily.     Marland Kitchen aspirin EC 81 MG tablet Take 81 mg by mouth daily.     Marland Kitchen atorvastatin (LIPITOR) 10 MG tablet Take 10 mg by mouth daily.     . calcium carbonate (OSCAL) 1500 (600 Ca) MG TABS tablet Take 600 mg of elemental calcium by mouth daily with breakfast.    . cholecalciferol (VITAMIN D) 25 MCG (1000 UNIT) tablet Take 1,000 Units by mouth daily.     . ferrous fumarate (HEMOCYTE - 106 MG FE) 325 (106 Fe)  MG TABS tablet Take 325 mg of iron by mouth in the morning and at bedtime.    . finasteride (PROSCAR) 5 MG tablet Take 1 tablet (5 mg total) by mouth daily. 90 tablet 3  . metoprolol succinate (TOPROL-XL) 25 MG 24 hr tablet Take 25 mg by mouth daily.    . metoprolol succinate (TOPROL-XL) 50 MG 24 hr tablet Take 50 mg by mouth daily.     . Multiple Vitamins-Minerals (MENS MULTIVITAMIN PO) Take 1 tablet by mouth daily.    . Multiple Vitamins-Minerals (PRESERVISION AREDS 2 PO) Take 1 tablet by mouth in the morning and at bedtime.    . Omega-3 Fatty Acids (FISH OIL) 1000 MG CAPS Take 1,000 mg by mouth daily.      No current facility-administered medications for this visit.   Facility-Administered Medications Ordered in Other Visits  Medication Dose Route Frequency Provider Last Rate Last Admin  .  iron sucrose (VENOFER) injection 200 mg  200 mg Intravenous Once Charlaine Dalton R, MD          .  PHYSICAL EXAMINATION: ECOG PERFORMANCE STATUS: 0 - Asymptomatic  Vitals:   01/16/20 1335  BP: 136/65  Pulse: 64  Resp: 16  Temp: 98.2 F (36.8 C)  SpO2: 100%   Filed Weights   01/16/20 1335  Weight: 194 lb (88 kg)    Physical Exam HENT:     Head: Normocephalic and atraumatic.     Mouth/Throat:     Pharynx: No oropharyngeal exudate.  Eyes:     Pupils: Pupils are equal, round, and reactive to light.  Cardiovascular:     Rate and Rhythm: Normal rate and regular rhythm.  Pulmonary:     Effort: No respiratory distress.     Breath sounds: No wheezing.  Abdominal:     General: Bowel sounds are normal. There is no distension.     Palpations: Abdomen is soft. There is no mass.     Tenderness: There is no abdominal tenderness. There is no guarding or rebound.  Musculoskeletal:        General: No tenderness. Normal range of motion.     Cervical back: Normal range of motion and neck supple.  Skin:    General: Skin is warm.  Neurological:     Mental Status: He is [name] and oriented  to person, place, and time.  Psychiatric:        Mood and Affect: Affect normal.       LABORATORY DATA:  I have reviewed the data as listed Lab Results  Component Value Date   WBC 7.5 01/16/2020   HGB 12.7 (L) 01/16/2020   HCT 38.7 (L) 01/16/2020   MCV 91.7 01/16/2020   PLT 201 01/16/2020   Recent Labs    12/18/19 0948 01/13/20 1412 01/16/20 1305  NA 140 141 141  K 4.1 4.6 3.7  CL 105 102 103  CO2 28 26 30   GLUCOSE 103* 92 110*  BUN 13 18 24*  CREATININE 0.92 0.97 1.05  CALCIUM 8.9 9.6 9.1  GFRNONAA >60 69 >60  GFRAA >60 80 >60    RADIOGRAPHIC STUDIES: I have personally reviewed the radiological images as listed and agreed with the findings in the report. No results found.  ASSESSMENT & PLAN:   Iron deficiency anemia due to chronic blood loss #Iron deficiency anemia secondary to chronic blood loss  [see below]-06/03- Hb 8.5; ferritin 15 [PCP]-currently on p.o. iron.  S/p  IV Venofer.  Hemoglobin- 12-13.  Proceed with Venofer today.  #Hematuria  microscopic chronic-status post recent cystoscopy high-grade superficial malignancy.awaiting cautery next week.  On Eliquis/A. Fib.  # Omental/peritoneal thickening-unclear etiology; follow-up CT scan March 2021--shows progressive omental/peritoneal thickening.  Stable order imaging next visit.  #High-grade noninvasive urothelial malignancy/hematuria-status post BCG x1; interpret secondary to hematuria/infection.  Defer to urology.   # DISPOSITION: # venofer infusion today # follow up in 2 months  MD; labs; cbc/bmp; Possible Venofer-Dr.B  All questions were answered. The patient knows to call the clinic with any problems, questions or concerns.    Cammie Sickle, MD 01/16/2020 2:24 PM

## 2020-01-16 NOTE — H&P (View-Only) (Signed)
[place] NOTE  Patient Care Team: Ezequiel Kayser, MD as PCP - General (Internal Medicine)  CHIEF COMPLAINTS/PURPOSE OF CONSULTATION: Peritoneal thickening/IDA  Oncology History Overview Note  # NOV 2020- [incidental/hematuria work-up]-subtle peritoneal thickening/plaque-like lesions; March 2021-CT scan progressive omental/peritoneal thickening  #Microscopic hematuria- sec to BPH [cystoscopy/CT urogram negative; Dr.Stoiff]; March 2021 CT scan-new lesion in the bladder diverticulum  # MAY-June 2021- severe IDA sec to hemauria [hb 8.5; Ferritin- ]  # on eliquis ? A.fib/pacemaker;   Urothelial carcinoma of bladder ([place])  10/29/2019 Initial Diagnosis   Urothelial carcinoma of bladder ([place])      HISTORY OF PRESENTING ILLNESS:  [name] 86 y.o.  male with a history of peritoneal/omental thickening of unclear etiology; also superficial bladder malignancy/hematuria.   Patient continues to have blood in urine.  Awaiting repeat cystoscopy next week for cautery.  Otherwise patient noted to have significant provement of his energy levels on IV iron.  Denies any blood in stools or black or stools.   Review of Systems  Constitutional: Positive for malaise/fatigue. Negative for chills, diaphoresis, fever and weight loss.  HENT: Negative for nosebleeds and sore throat.   Eyes: Negative for double vision.  Respiratory: Positive for shortness of breath. Negative for cough, hemoptysis, sputum production and wheezing.   Cardiovascular: Negative for chest pain, palpitations, orthopnea and leg swelling.  Gastrointestinal: Negative for abdominal pain, blood in stool, constipation, diarrhea, heartburn, melena, nausea and vomiting.  Genitourinary: Positive for hematuria. Negative for dysuria, frequency and urgency.  Musculoskeletal: Negative for back pain and joint pain.  Skin: Negative.  Negative for itching and rash.  Neurological: Negative for dizziness, tingling,  focal weakness, weakness and headaches.  Endo/Heme/Allergies: Does not bruise/bleed easily.  Psychiatric/Behavioral: Negative for depression. The patient is not nervous/anxious and does not have insomnia.      MEDICAL HISTORY:  Past Medical History:  Diagnosis Date  . BPH (benign prostatic hyperplasia)   . Cancer ([place])    skin cancer on face carcinoma  . ED (erectile dysfunction)   . Gilbert disease   . HLD (hyperlipidemia)   . Hypertension   . Macular degeneration   . Osteopenia   . Presence of permanent cardiac pacemaker   . Sensorineural hearing loss     SURGICAL HISTORY: Past Surgical History:  Procedure Laterality Date  . CATARACT EXTRACTION Bilateral   . HEMORRHOIDECTOMY WITH HEMORRHOID BANDING    . KNEE ARTHROSCOPY Left   . PACEMAKER INSERTION N/A 03/03/2015   Procedure: INSERTION DUAL LEAD PACEMAKER;  Surgeon: Isaias Cowman, MD;  Location: [facility] ORS;  Service: Cardiovascular;  Laterality: N/A;  . TONSILLECTOMY    . TRANSURETHRAL RESECTION OF BLADDER TUMOR N/A 10/07/2019   Procedure: TRANSURETHRAL RESECTION OF BLADDER TUMOR (TURBT);  Surgeon: Abbie Sons, MD;  Location: [facility] ORS;  Service: Urology;  Laterality: N/A;    SOCIAL HISTORY: Social History   Socioeconomic History  . Marital status: Widowed    Spouse name: Not on file  . Number of children: Not on file  . Years of education: Not on file  . Highest education level: Not on file  Occupational History  . Not on file  Tobacco Use  . Smoking status: Never Smoker  . Smokeless tobacco: Never Used  Vaping Use  . Vaping Use: Never used  Substance and Sexual Activity  . Alcohol use: Yes    Alcohol/week: 7.0 standard drinks    Types: 7 Glasses of wine per week  . Drug use: No  .  Sexual activity: Yes    Birth control/protection: None  Other Topics Concern  . Not on file  Social History Narrative   Retd. Civil engineer, contracting; [place]; self. Never smoked; alcohol- glass wine/drink every night.      Social Determinants of Health   Financial Resource Strain:   . Difficulty of Paying Living Expenses:   Food Insecurity:   . Worried About Charity fundraiser in the Last Year:   . Arboriculturist in the Last Year:   Transportation Needs:   . Film/video editor (Medical):   Marland Kitchen Lack of Transportation (Non-Medical):   Physical Activity:   . Days of Exercise per Week:   . Minutes of Exercise per Session:   Stress:   . Feeling of Stress :   Social Connections:   . Frequency of Communication with Friends and Family:   . Frequency of Social Gatherings with Friends and Family:   . Attends Religious Services:   . Active Member of Clubs or Organizations:   . Attends Archivist Meetings:   Marland Kitchen Marital Status:   Intimate Partner Violence:   . Fear of Current or Ex-Partner:   . Emotionally Abused:   Marland Kitchen Physically Abused:   . Sexually Abused:     FAMILY HISTORY: Family History  Problem Relation Age of Onset  . Diabetes Mother   . Lung cancer Father   . Cancer Paternal Uncle   . Prostate cancer Neg Hx   . Bladder Cancer Neg Hx   . Kidney cancer Neg Hx     ALLERGIES:  is allergic to atenolol.  MEDICATIONS:  Current Outpatient Medications  Medication Sig Dispense Refill  . acetaminophen (TYLENOL) 500 MG tablet Take 1,000 mg by mouth every 6 (six) hours as needed for moderate pain.     Marland Kitchen alfuzosin (UROXATRAL) 10 MG 24 hr tablet Take 1 tablet (10 mg total) by mouth daily with breakfast. 90 tablet 3  . apixaban (ELIQUIS) 5 MG TABS tablet Take 5 mg by mouth 2 (two) times daily.     Marland Kitchen aspirin EC 81 MG tablet Take 81 mg by mouth daily.     Marland Kitchen atorvastatin (LIPITOR) 10 MG tablet Take 10 mg by mouth daily.     . calcium carbonate (OSCAL) 1500 (600 Ca) MG TABS tablet Take 600 mg of elemental calcium by mouth daily with breakfast.    . cholecalciferol (VITAMIN D) 25 MCG (1000 UNIT) tablet Take 1,000 Units by mouth daily.     . ferrous fumarate (HEMOCYTE - 106 MG FE) 325 (106 Fe)  MG TABS tablet Take 325 mg of iron by mouth in the morning and at bedtime.    . finasteride (PROSCAR) 5 MG tablet Take 1 tablet (5 mg total) by mouth daily. 90 tablet 3  . metoprolol succinate (TOPROL-XL) 25 MG 24 hr tablet Take 25 mg by mouth daily.    . metoprolol succinate (TOPROL-XL) 50 MG 24 hr tablet Take 50 mg by mouth daily.     . Multiple Vitamins-Minerals (MENS MULTIVITAMIN PO) Take 1 tablet by mouth daily.    . Multiple Vitamins-Minerals (PRESERVISION AREDS 2 PO) Take 1 tablet by mouth in the morning and at bedtime.    . Omega-3 Fatty Acids (FISH OIL) 1000 MG CAPS Take 1,000 mg by mouth daily.      No current facility-administered medications for this visit.   Facility-Administered Medications Ordered in Other Visits  Medication Dose Route Frequency Provider Last Rate Last Admin  .  iron sucrose (VENOFER) injection 200 mg  200 mg Intravenous Once Charlaine Dalton R, MD          .  PHYSICAL EXAMINATION: ECOG PERFORMANCE STATUS: 0 - Asymptomatic  Vitals:   01/16/20 1335  BP: 136/65  Pulse: 64  Resp: 16  Temp: 98.2 F (36.8 C)  SpO2: 100%   Filed Weights   01/16/20 1335  Weight: 194 lb (88 kg)    Physical Exam HENT:     Head: Normocephalic and atraumatic.     Mouth/Throat:     Pharynx: No oropharyngeal exudate.  Eyes:     Pupils: Pupils are equal, round, and reactive to light.  Cardiovascular:     Rate and Rhythm: Normal rate and regular rhythm.  Pulmonary:     Effort: No respiratory distress.     Breath sounds: No wheezing.  Abdominal:     General: Bowel sounds are normal. There is no distension.     Palpations: Abdomen is soft. There is no mass.     Tenderness: There is no abdominal tenderness. There is no guarding or rebound.  Musculoskeletal:        General: No tenderness. Normal range of motion.     Cervical back: Normal range of motion and neck supple.  Skin:    General: Skin is warm.  Neurological:     Mental Status: He is alert and oriented  to person, place, and time.  Psychiatric:        Mood and Affect: Affect normal.       LABORATORY DATA:  I have reviewed the data as listed Lab Results  Component Value Date   WBC 7.5 01/16/2020   HGB 12.7 (L) 01/16/2020   HCT 38.7 (L) 01/16/2020   MCV 91.7 01/16/2020   PLT 201 01/16/2020   Recent Labs    12/18/19 0948 01/13/20 1412 01/16/20 1305  NA 140 141 141  K 4.1 4.6 3.7  CL 105 102 103  CO2 28 26 30   GLUCOSE 103* 92 110*  BUN 13 18 24*  CREATININE 0.92 0.97 1.05  CALCIUM 8.9 9.6 9.1  GFRNONAA >60 69 >60  GFRAA >60 80 >60    RADIOGRAPHIC STUDIES: I have personally reviewed the radiological images as listed and agreed with the findings in the report. No results found.  ASSESSMENT & PLAN:   Iron deficiency anemia due to chronic blood loss #Iron deficiency anemia secondary to chronic blood loss  [see below]-06/03- Hb 8.5; ferritin 15 [PCP]-currently on p.o. iron.  S/p  IV Venofer.  Hemoglobin- 12-13.  Proceed with Venofer today.  #Hematuria  microscopic chronic-status post recent cystoscopy high-grade superficial malignancy.awaiting cautery next week.  On Eliquis/A. Fib.  # Omental/peritoneal thickening-unclear etiology; follow-up CT scan March 2021--shows progressive omental/peritoneal thickening.  Stable order imaging next visit.  #High-grade noninvasive urothelial malignancy/hematuria-status post BCG x1; interpret secondary to hematuria/infection.  Defer to urology.   # DISPOSITION: # venofer infusion today # follow up in 2 months  MD; labs; cbc/bmp; Possible Venofer-Dr.B  All questions were answered. The patient knows to call the clinic with any problems, questions or concerns.    Cammie Sickle, MD 01/16/2020 2:24 PM

## 2020-01-16 NOTE — Assessment & Plan Note (Addendum)
#  Iron deficiency anemia secondary to chronic blood loss  [see below]-06/03- Hb 8.5; ferritin 15 [PCP]-currently on p.o. iron.  S/p  IV Venofer.  Hemoglobin- 12-13.  Proceed with Venofer today.  #Hematuria  microscopic chronic-status post recent cystoscopy high-grade superficial malignancy.awaiting cautery next week.  On Eliquis/A. Fib.  # Omental/peritoneal thickening-unclear etiology; follow-up CT scan March 2021--shows progressive omental/peritoneal thickening.  Stable order imaging next visit.  #High-grade noninvasive urothelial malignancy/hematuria-status post BCG x1; interpret secondary to hematuria/infection.  Defer to urology.   # DISPOSITION: # venofer infusion today # follow up in 2 months  MD; labs; cbc/bmp; Possible Venofer-Dr.B

## 2020-01-17 LAB — SARS CORONAVIRUS 2 (TAT 6-24 HRS): SARS Coronavirus 2: NEGATIVE

## 2020-01-19 ENCOUNTER — Encounter: Payer: Self-pay | Admitting: Urology

## 2020-01-20 ENCOUNTER — Other Ambulatory Visit: Payer: Self-pay

## 2020-01-20 ENCOUNTER — Encounter
Admission: RE | Disposition: A | Discharge status: Home / Self Care | Payer: Self-pay | Source: Home / Self Care | Attending: Urology

## 2020-01-20 ENCOUNTER — Encounter: Payer: Self-pay | Admitting: Urology

## 2020-01-20 ENCOUNTER — Ambulatory Visit
Admission: RE | Admit: 2020-01-20 | Discharge: 2020-01-20 | Disposition: A | Discharge status: Home / Self Care | Payer: Medicare HMO | Attending: Urology | Admitting: Urology

## 2020-01-20 ENCOUNTER — Ambulatory Visit: Payer: Medicare HMO | Admitting: Urgent Care

## 2020-01-20 ENCOUNTER — Ambulatory Visit: Payer: Medicare HMO | Admitting: Certified Registered Nurse Anesthetist

## 2020-01-20 DIAGNOSIS — I10 Essential (primary) hypertension: Secondary | ICD-10-CM | POA: Insufficient documentation

## 2020-01-20 DIAGNOSIS — R310 Gross hematuria: Secondary | ICD-10-CM | POA: Diagnosis not present

## 2020-01-20 DIAGNOSIS — Z7982 Long term (current) use of aspirin: Secondary | ICD-10-CM | POA: Insufficient documentation

## 2020-01-20 DIAGNOSIS — Z79899 Other long term (current) drug therapy: Secondary | ICD-10-CM | POA: Insufficient documentation

## 2020-01-20 DIAGNOSIS — Z950 Presence of cardiac pacemaker: Secondary | ICD-10-CM | POA: Insufficient documentation

## 2020-01-20 DIAGNOSIS — H905 Unspecified sensorineural hearing loss: Secondary | ICD-10-CM | POA: Insufficient documentation

## 2020-01-20 DIAGNOSIS — C679 Malignant neoplasm of bladder, unspecified: Secondary | ICD-10-CM | POA: Insufficient documentation

## 2020-01-20 DIAGNOSIS — Z8551 Personal history of malignant neoplasm of bladder: Secondary | ICD-10-CM

## 2020-01-20 DIAGNOSIS — I4891 Unspecified atrial fibrillation: Secondary | ICD-10-CM | POA: Insufficient documentation

## 2020-01-20 DIAGNOSIS — N400 Enlarged prostate without lower urinary tract symptoms: Secondary | ICD-10-CM | POA: Insufficient documentation

## 2020-01-20 DIAGNOSIS — E785 Hyperlipidemia, unspecified: Secondary | ICD-10-CM | POA: Insufficient documentation

## 2020-01-20 DIAGNOSIS — Z7901 Long term (current) use of anticoagulants: Secondary | ICD-10-CM | POA: Insufficient documentation

## 2020-01-20 HISTORY — PX: CYSTOSCOPY WITH FULGERATION: SHX6638

## 2020-01-20 HISTORY — PX: CYSTOSCOPY WITH BIOPSY: SHX5122

## 2020-01-20 SURGERY — CYSTOSCOPY, WITH BIOPSY
Anesthesia: General | Site: Bladder

## 2020-01-20 MED ORDER — LIDOCAINE HCL (CARDIAC) PF 100 MG/5ML IV SOSY
PREFILLED_SYRINGE | INTRAVENOUS | Status: DC | PRN
  Administered 2020-01-20: 100 mg via INTRAVENOUS

## 2020-01-20 MED ORDER — FENTANYL CITRATE (PF) 100 MCG/2ML IJ SOLN
INTRAMUSCULAR | Status: AC
  Filled 2020-01-20: qty 2

## 2020-01-20 MED ORDER — FENTANYL CITRATE (PF) 100 MCG/2ML IJ SOLN: 25.0000 ug | INTRAMUSCULAR | Status: DC | PRN

## 2020-01-20 MED ORDER — ONDANSETRON HCL 4 MG/2ML IJ SOLN
INTRAMUSCULAR | Status: DC | PRN
  Administered 2020-01-20: 4 mg via INTRAVENOUS

## 2020-01-20 MED ORDER — CEFAZOLIN SODIUM-DEXTROSE 2-4 GM/100ML-% IV SOLN
2.0000 g | INTRAVENOUS | Status: AC
  Administered 2020-01-20: 2 g via INTRAVENOUS

## 2020-01-20 MED ORDER — LACTATED RINGERS IV SOLN: INTRAVENOUS | Status: DC

## 2020-01-20 MED ORDER — PROPOFOL 10 MG/ML IV BOLUS
INTRAVENOUS | Status: DC | PRN
  Administered 2020-01-20: 100 mg via INTRAVENOUS
  Administered 2020-01-20: 50 mg via INTRAVENOUS

## 2020-01-20 MED ORDER — SODIUM CHLORIDE 0.9 % IV SOLN
INTRAVENOUS | Status: DC | PRN
  Administered 2020-01-20: 20 ug/min via INTRAVENOUS

## 2020-01-20 MED ORDER — ORAL CARE MOUTH RINSE: 15.0000 mL | Freq: Once | OROMUCOSAL | Status: AC

## 2020-01-20 MED ORDER — ONDANSETRON HCL 4 MG/2ML IJ SOLN: 4.0000 mg | Freq: Once | INTRAMUSCULAR | Status: DC | PRN

## 2020-01-20 MED ORDER — FAMOTIDINE 20 MG PO TABS
ORAL_TABLET | ORAL | Status: AC
  Administered 2020-01-20: 20 mg via ORAL
  Filled 2020-01-20: qty 1

## 2020-01-20 MED ORDER — DEXAMETHASONE SODIUM PHOSPHATE 10 MG/ML IJ SOLN
INTRAMUSCULAR | Status: DC | PRN
  Administered 2020-01-20: 10 mg via INTRAVENOUS

## 2020-01-20 MED ORDER — CHLORHEXIDINE GLUCONATE 0.12 % MT SOLN
OROMUCOSAL | Status: AC
  Administered 2020-01-20: 15 mL via OROMUCOSAL
  Filled 2020-01-20: qty 15

## 2020-01-20 MED ORDER — FAMOTIDINE 20 MG PO TABS: 20.0000 mg | ORAL_TABLET | Freq: Once | ORAL | Status: AC

## 2020-01-20 MED ORDER — FENTANYL CITRATE (PF) 100 MCG/2ML IJ SOLN
INTRAMUSCULAR | Status: DC | PRN
  Administered 2020-01-20 (×4): 25 ug via INTRAVENOUS

## 2020-01-20 MED ORDER — CEFAZOLIN SODIUM-DEXTROSE 2-4 GM/100ML-% IV SOLN
INTRAVENOUS | Status: AC
  Filled 2020-01-20: qty 100

## 2020-01-20 MED ORDER — PROPOFOL 10 MG/ML IV BOLUS
INTRAVENOUS | Status: AC
  Filled 2020-01-20: qty 20

## 2020-01-20 MED ORDER — CHLORHEXIDINE GLUCONATE 0.12 % MT SOLN: 15.0000 mL | Freq: Once | OROMUCOSAL | Status: AC

## 2020-01-20 SURGICAL SUPPLY — 39 items
BAG DRAIN CYSTO-URO LG1000N (MISCELLANEOUS) ×4 IMPLANT
BAG URINE DRAIN 2000ML AR STRL (UROLOGICAL SUPPLIES) ×4 IMPLANT
BRUSH SCRUB EZ 1% IODOPHOR (MISCELLANEOUS) ×4 IMPLANT
CATH FOLEY 2WAY 18X30 (CATHETERS) IMPLANT
CATH FOLEY 2WAY SIL 18X30 (CATHETERS)
COVER WAND RF STERILE (DRAPES) ×4 IMPLANT
DRAPE UTILITY 15X26 TOWEL STRL (DRAPES) ×4 IMPLANT
DRSG TELFA 4X3 1S NADH ST (GAUZE/BANDAGES/DRESSINGS) ×4 IMPLANT
ELECT BIVAP BIPO 22/24 DONUT (ELECTROSURGICAL) ×4
ELECT LOOP 22F BIPOLAR SML (ELECTROSURGICAL)
ELECT REM PT RETURN 9FT ADLT (ELECTROSURGICAL) ×4
ELECTRD BIVAP BIPO 22/24 DONUT (ELECTROSURGICAL) ×2 IMPLANT
ELECTRODE LOOP 22F BIPOLAR SML (ELECTROSURGICAL) IMPLANT
ELECTRODE REM PT RTRN 9FT ADLT (ELECTROSURGICAL) ×2 IMPLANT
GLOVE BIOGEL PI IND STRL 7.5 (GLOVE) ×2 IMPLANT
GLOVE BIOGEL PI INDICATOR 7.5 (GLOVE) ×2
GOWN STRL REUS W/ TWL LRG LVL3 (GOWN DISPOSABLE) ×4 IMPLANT
GOWN STRL REUS W/ TWL XL LVL3 (GOWN DISPOSABLE) ×2 IMPLANT
GOWN STRL REUS W/TWL LRG LVL3 (GOWN DISPOSABLE) ×4
GOWN STRL REUS W/TWL XL LVL3 (GOWN DISPOSABLE) ×2
GOWN STRL REUS W/TWL XL LVL4 (GOWN DISPOSABLE) ×4 IMPLANT
GUIDEWIRE STR DUAL SENSOR (WIRE) ×8 IMPLANT
INTRODUCER DILATOR DOUBLE (INTRODUCER) ×4 IMPLANT
IV NS IRRIG 3000ML ARTHROMATIC (IV SOLUTION) ×8 IMPLANT
KIT TURNOVER CYSTO (KITS) ×4 IMPLANT
LOOP CUT BIPOLAR 24F LRG (ELECTROSURGICAL) IMPLANT
NDL SAFETY ECLIPSE 18X1.5 (NEEDLE) ×2 IMPLANT
NEEDLE HYPO 18GX1.5 SHARP (NEEDLE) ×2
PACK CYSTO AR (MISCELLANEOUS) ×4 IMPLANT
SET CYSTO W/LG BORE CLAMP LF (SET/KITS/TRAYS/PACK) ×4 IMPLANT
SET IRRIG Y TYPE TUR BLADDER L (SET/KITS/TRAYS/PACK) ×4 IMPLANT
SHEATH URETERAL 12FRX35CM (MISCELLANEOUS) ×4 IMPLANT
SOL .9 NS 3000ML IRR  AL (IV SOLUTION) ×2
SOL .9 NS 3000ML IRR UROMATIC (IV SOLUTION) ×2 IMPLANT
SURGILUBE 2OZ TUBE FLIPTOP (MISCELLANEOUS) ×4 IMPLANT
SYR 10ML LL (SYRINGE) ×4 IMPLANT
SYRINGE IRR TOOMEY STRL 70CC (SYRINGE) ×4 IMPLANT
WATER STERILE IRR 1000ML POUR (IV SOLUTION) ×4 IMPLANT
WATER STERILE IRR 3000ML UROMA (IV SOLUTION) ×4 IMPLANT

## 2020-01-20 NOTE — Discharge Instructions (Addendum)
AMBULATORY SURGERY  DISCHARGE INSTRUCTIONS   1) The drugs that you were given will stay in your system until tomorrow so for the next 24 hours you should not:  A) Drive an automobile B) Make any legal decisions C) Drink any alcoholic beverage   2) You may resume regular meals tomorrow.  Today it is better to start with liquids and gradually work up to solid foods.  You may eat anything you prefer, but it is better to start with liquids, then soup and crackers, and gradually work up to solid foods.   3) Please notify your doctor immediately if you have any unusual bleeding, trouble breathing, redness and pain at the surgery site, drainage, fever, or pain not relieved by medication.  4) Your post-operative visit with Dr.                                     is: Date:                        Time:    Please call to schedule your post-operative visit.  5) Additional Instructions:  Bladder Biopsy   Definition:  Transurethral Resection of the Bladder Tumor is a surgical procedure used to diagnose and remove tumors within the bladder.   General instructions:     Your recent bladder surgery requires very little post hospital care but some definite precautions.  Despite the fact that no skin incisions were used, the area around the bladder incisions are raw and covered with scabs to promote healing and prevent bleeding. Certain precautions are needed to insure that the scabs are not disturbed over the next 2-4 weeks while the healing proceeds.  Because the raw surface inside your bladder and the irritating effects of urine you may expect frequency of urination and/or urgency (a stronger desire to urinate) and perhaps even getting up at night more often. This will usually resolve or improve slowly over the healing period. You may see some blood in your urine over the first 6 weeks. Do not be alarmed, even if the urine was clear for a while. Get off your feet and drink lots of fluids until  clearing occurs. If you start to pass clots or don't improve call us.  Diet:  You may return to your normal diet immediately. Because of the raw surface of your bladder, alcohol, spicy foods, foods high in acid and drinks with caffeine may cause irritation or frequency and should be used in moderation. To keep your urine flowing freely and avoid constipation, drink plenty of fluids during the day (8-10 glasses). Tip: Avoid cranberry juice because it is very acidic.  Activity:  Your physical activity doesn't need to be restricted. However, if you are very active, you may see some blood in the urine. We suggest that you reduce your activity under the circumstances until the bleeding has stopped.  Bowels:  It is important to keep your bowels regular during the postoperative period. Straining with bowel movements can cause bleeding. A bowel movement every other day is reasonable. Use a mild laxative if needed, such as milk of magnesia 2-3 tablespoons, or 2 Dulcolax tablets. Call if you continue to have problems. If you had been taking narcotics for pain, before, during or after your surgery, you may be constipated. Take a laxative if necessary.    Medication:  You should resume your pre-surgery medications  unless told not to. In addition you may be given an antibiotic to prevent or treat infection. Antibiotics are not always necessary. All medication should be taken as prescribed until the bottles are finished unless you are having an unusual reaction to one of the drugs.   [place] [address], [place], [place] [address] [phone]

## 2020-01-20 NOTE — Anesthesia Preprocedure Evaluation (Signed)
Anesthesia Evaluation  Patient identified by MRN, date of birth, ID band Patient awake    Reviewed: Allergy & Precautions, NPO status , Patient's Chart, lab work & pertinent test results  History of Anesthesia Complications Negative for: history of anesthetic complications  Airway Mallampati: III  TM Distance: >3 FB Neck ROM: Full    Dental no notable dental hx. (+) Teeth Intact, Dental Advisory Given   Pulmonary shortness of breath (secondary to anemia) and with exertion, neg sleep apnea, neg COPD, Patient abstained from smoking.Not current smoker,    Pulmonary exam normal breath sounds clear to auscultation       Cardiovascular Exercise Tolerance: Good METShypertension, Pt. on medications (-) angina(-) CAD and (-) Past MI + dysrhythmias Atrial Fibrillation + pacemaker + Cardiac Defibrillator  Rhythm:Regular Rate:Normal - Systolic murmurs Pacemaker indications:symptomatic bradycardia Model #: dual lead inserted 03/03/15 Med[employer]onic [phone] Date of Last Interrogation: 12/30/18 (every 6 months) normal functioning, estimated longevity 5.5 yrs afib burden was 100% . EKG (02/19/19 atrial paced rhythm with prolonged AV conduction with occ PVC ) Hyperlipidemia - on Atorvastatin Last cardiology visit 03/21/19  03/03/19 NM Myocardial perfusion - no evidence of reversible ischemia 03/03/19 Echo EF>55% LV function normal    Neuro/Psych negative neurological ROS  negative psych ROS   GI/Hepatic neg GERD  ,(+)     (-) substance abuse  ,   Endo/Other  neg diabetes  Renal/GU negative Renal ROS     Musculoskeletal   Abdominal   Peds  Hematology  (+) Blood dyscrasia, anemia ,   Anesthesia Other Findings Past Medical History: No date: BPH (benign prostatic hyperplasia) No date: Cancer ([place])     Comment:  skin cancer on face carcinoma No date: ED (erectile dysfunction) No date: Rosanna Randy disease No date: HLD  (hyperlipidemia) No date: Hypertension No date: Macular degeneration No date: Osteopenia No date: Presence of permanent cardiac pacemaker No date: Sensorineural hearing loss  Reproductive/Obstetrics                             Anesthesia Physical  Anesthesia Plan  ASA: III  Anesthesia Plan: General   Post-op Pain Management:    Induction: Intravenous  PONV Risk Score and Plan: 2 and Ondansetron and Dexamethasone  Airway Management Planned: Oral ETT and LMA  Additional Equipment: None  Intra-op Plan:   Post-operative Plan: Extubation in OR  Informed Consent: I have reviewed the patients History and Physical, chart, labs and discussed the procedure including the risks, benefits and alternatives for the proposed anesthesia with the patient or authorized representative who has indicated his/her understanding and acceptance.     Dental advisory given  Plan Discussed with: CRNA and Surgeon  Anesthesia Plan Comments: (Discussed risks of anesthesia with patient, including PONV, sore throat, lip/dental damage. Rare risks discussed as well, such as cardiorespiratory and neurological sequelae. Patient understands. Patient pacer dependent. Will have magnet available if necessary, but since any EMI would be below umbilicus, low likelihood of interference.)        Anesthesia Quick Evaluation

## 2020-01-20 NOTE — Interval H&P Note (Signed)
History and Physical Interval Note:  86 yo male with history of high-grade urothelial carcinoma status post TURBT 10/07/2019.  He was scheduled for BCG however due to persistent hematuria (on Eliquis) he was only able to receive 2 doses.  Since his initial resection was 3 months ago was recommended he undergo cystoscopy under anesthesia with possible biopsy, fulguration or TURBT.  All questions were answered and he desires to proceed.  01/20/2020 9:48 AM  [name]  has presented today for surgery, with the diagnosis of high grade urothelial carcinoma, gross hematuria.  The various methods of treatment have been discussed with the patient and family. After consideration of risks, benefits and other options for treatment, the patient has consented to  Procedure(s): CYSTOSCOPY under anesthesia (N/A) TRANSURETHRAL RESECTION OF BLADDER TUMOR (TURBT) (N/A) CYSTOSCOPY WITH BIOPSY (N/A) CYSTOSCOPY WITH FULGERATION (N/A) as a surgical intervention.  The patient's history has been reviewed, patient examined, no change in status, stable for surgery.  I have reviewed the patient's chart and labs.  Questions were answered to the patient's satisfaction.     [place]

## 2020-01-20 NOTE — Anesthesia Postprocedure Evaluation (Signed)
Anesthesia Post Note  Patient: [name]  Procedure(s) Performed: CYSTOSCOPY WITH BIOPSY (N/A Bladder) CYSTOSCOPY WITH FULGERATION (N/A Bladder)  Patient location during evaluation: PACU Anesthesia Type: General Level of consciousness: awake and alert Pain management: pain level controlled Vital Signs Assessment: post-procedure vital signs reviewed and stable Respiratory status: spontaneous breathing, nonlabored ventilation, respiratory function stable and patient connected to nasal cannula oxygen Cardiovascular status: blood pressure returned to baseline and stable Postop Assessment: no apparent nausea or vomiting Anesthetic complications: no   No complications documented.   Last Vitals:  Vitals:   01/20/20 1134 01/20/20 1144  BP: (!) 157/74 (!) 158/77  Pulse: (!) 59 62  Resp: 12 16  Temp:  (!) 36.1 C  SpO2: 99% 99%    Last Pain:  Vitals:   01/20/20 1144  TempSrc: Temporal  PainSc:                  Martha Clan

## 2020-01-20 NOTE — Op Note (Signed)
Preoperative diagnosis: 1. Gross hematuria 2. History high-grade urothelial carcinoma  Postoperative diagnosis:  1. Same  Procedure:  1. Cystoscopy 2. Transurethral bladder biopsies 3. Fulguration bladder  Surgeon: Nicki Reaper C. Keiandre Cygan, M.D.  Anesthesia: General  Complications: None  Intraoperative findings:  1. No recurrent solid or papillary lesions posterior wall erythema and erythema within left bladder diverticulum   EBL: Minimal  Specimens: 1. Bladder biopsies      Indication: AREN PRYDE is an 86 yo male with history of high-grade urothelial carcinoma status post TURBT 10/07/2019.  He was scheduled for BCG however due to persistent hematuria (on Eliquis) he was only able to receive 2 doses.  Since his initial resection was 3 months ago was recommended he undergo cystoscopy under anesthesia with possible biopsy, fulguration or TURBT.  After reviewing the management options for treatment, he elected to proceed with the above surgical procedure(s). We have discussed the potential benefits and risks of the procedure, side effects of the proposed treatment, the likelihood of the patient achieving the goals of the procedure, and any potential problems that might occur during the procedure or recuperation. Informed consent has been obtained.  Description of procedure:  The patient was taken to the operating room and general anesthesia was induced.  The patient was placed in the dorsal lithotomy position, prepped and draped in the usual sterile fashion, and preoperative antibiotics were administered. A preoperative time-out was performed.   A 26 French continuous-flow resectoscope sheath with visual obturator was lubricated and passed per urethra.  The urethra was normal in caliber without stricture.  The prostate demonstrated moderate lateral lobe enlargement and moderate bladder neck elevation  The bladder mucosa was inspected with findings as described above.  Ureteral  procedures were normal-appearing bilaterally.  Using a flexible biopsy forceps placed through the resectoscope via a laser bridge multiple biopsies of the areas of mucosal erythema were obtained including mucosal biopsies of the diverticulum.  The Iglesias resectoscope with button electrode was then placed into the resectoscope and biopsy sites were fulgurated as well as the areas of mucosal erythema.  At the completion procedure no active bleeding was noted.  The bladder was then emptied and the resectoscope was removed.  A 18 French Foley catheter was placed with return of clear effluent upon irrigation.  The patient appeared to tolerate the procedure well and without complications.  After anesthetic reversal he was transported to the PACU in stable condition.  Plan: Foley catheter will be removed prior to discharge if no significant hematuria.  He will be notified with the pathology report   Halia Franey C. Bernardo Heater,  MD

## 2020-01-20 NOTE — Transfer of Care (Signed)
Immediate Anesthesia Transfer of Care Note  Patient: Christopher [name]  Procedure(s) Performed: CYSTOSCOPY WITH BIOPSY (N/A Bladder) CYSTOSCOPY WITH FULGERATION (N/A Bladder)  Patient Location: PACU  Anesthesia Type:General  Level of Consciousness: drowsy  Airway & Oxygen Therapy: Patient Spontanous Breathing and Patient connected to face mask oxygen  Post-op Assessment: Report given to RN and Post -op Vital signs reviewed and stable  Post vital signs: Reviewed and stable  Last Vitals:  Vitals Value Taken Time  BP 149/69 01/20/20 1049  Temp    Pulse 59 01/20/20 1050  Resp 10 01/20/20 1050  SpO2 99 % 01/20/20 1050  Vitals shown include unvalidated device data.  Last Pain:  Vitals:   01/20/20 0843  TempSrc: Tympanic  PainSc: 0-No pain      Patients Stated Pain Goal: 0 (77/93/96 [id number])  Complications: No complications documented.

## 2020-01-20 NOTE — Anesthesia Procedure Notes (Signed)
Procedure Name: LMA Insertion Date/Time: 01/20/2020 9:58 AM Performed by: Lily Peer, [place], RN Pre-anesthesia Checklist: Patient identified, Patient being monitored, Emergency Drugs available and Suction available Patient Re-evaluated:Patient Re-evaluated prior to induction Oxygen Delivery Method: Circle system utilized Preoxygenation: Pre-oxygenation with 100% oxygen Induction Type: IV induction Ventilation: Mask ventilation without difficulty LMA: LMA inserted LMA Size: 5.0 Tube type: Oral Number of attempts: 1 Placement Confirmation: positive ETCO2 and breath sounds checked- equal and bilateral Tube secured with: Tape Dental Injury: Teeth and Oropharynx as per pre-operative assessment

## 2020-01-21 ENCOUNTER — Encounter: Payer: Self-pay | Admitting: Urology

## 2020-01-21 LAB — SURGICAL PATHOLOGY

## 2020-01-23 ENCOUNTER — Ambulatory Visit: Payer: Self-pay | Admitting: Physician Assistant

## 2020-01-30 ENCOUNTER — Telehealth: Payer: Self-pay | Admitting: Urology

## 2020-01-30 ENCOUNTER — Ambulatory Visit: Payer: Self-pay | Admitting: Physician Assistant

## 2020-01-30 NOTE — Telephone Encounter (Signed)
I contacted Mr. Christopher [name] regarding his bladder pathology.  History of high-grade noninvasive urothelial carcinoma and and surrounding a bladder diverticulum.  He was having intermittent hematuria and was only able to get to BCG doses within a 66-month.  Repeat cystoscopy under anesthesia was recommended and performed 6/25.  He states he has recently developed hematuria with blood clots.  We discussed his pathology report with biopsy showing high-grade urothelial carcinoma with lamina propria invasion.  No discrete tumor was noted and all abnormal appearing tissue was fulgurated.  Recommended we get him set up to complete BCG course.  I did recommend he hold his Eliquis for the next 2-3 days and call back Monday if his hematuria has not improved.

## 2020-02-04 ENCOUNTER — Telehealth: Payer: Self-pay

## 2020-02-04 NOTE — Telephone Encounter (Signed)
Patient called stating that he restarted his Eliquis on Tuesday his urine is a dark amber color, no clots at this time. He states he was told he may need to have bladder fulguration again if the blood is still present. He would like to know how to proceed?  Patient was encouraged to increase water intake as well

## 2020-02-06 NOTE — Telephone Encounter (Signed)
He will continue to have recurrent bleeding even with fulgurations since he is on Eliquis.  I will send his cardiologist a message to see if he can hold his Eliquis 2 to 3 days prior to BCG treatment

## 2020-02-09 NOTE — Telephone Encounter (Signed)
Left pt mess to call 

## 2020-02-10 ENCOUNTER — Telehealth: Payer: Self-pay

## 2020-02-10 NOTE — Telephone Encounter (Signed)
Pt LM on triage line stating he was returning your call.

## 2020-02-11 NOTE — Telephone Encounter (Signed)
Patient notified and was told would call back to schedule BCG. Awaiting medication to come in, it is currently on back order. Patient verbalized understanding

## 2020-02-12 ENCOUNTER — Other Ambulatory Visit: Payer: Self-pay

## 2020-02-12 ENCOUNTER — Ambulatory Visit: Payer: Medicare HMO | Admitting: Dermatology

## 2020-02-12 DIAGNOSIS — L820 Inflamed seborrheic keratosis: Secondary | ICD-10-CM | POA: Diagnosis not present

## 2020-02-12 DIAGNOSIS — L570 Actinic keratosis: Secondary | ICD-10-CM

## 2020-02-12 DIAGNOSIS — C4442 Squamous cell carcinoma of skin of scalp and neck: Secondary | ICD-10-CM | POA: Diagnosis not present

## 2020-02-12 DIAGNOSIS — D492 Neoplasm of unspecified behavior of bone, soft tissue, and skin: Secondary | ICD-10-CM

## 2020-02-12 DIAGNOSIS — L578 Other skin changes due to chronic exposure to nonionizing radiation: Secondary | ICD-10-CM

## 2020-02-12 NOTE — Patient Instructions (Addendum)

## 2020-02-12 NOTE — Progress Notes (Signed)
Follow-Up Visit   Subjective  Christopher [name] is a 86 y.o. male who presents for the following: Actinic Keratosis (75m f/u, neck, face, ears), ISK f/u (81m f/u back), and check new spot (R temple x 2wks).  The following portions of the chart were reviewed this encounter and updated as appropriate:  Tobacco  Allergies  Meds  Problems  Med Hx  Surg Hx  Fam Hx     Review of Systems:  No other skin or systemic complaints except as noted in HPI or Assessment and Plan.  Objective  Well appearing patient in no apparent distress; mood and affect are within normal limits.  A focused examination was performed including face, neck, ears, back. Relevant physical exam findings are noted in the Assessment and Plan.  Objective  Back x 21 (21): Erythematous keratotic or waxy stuck-on papule or plaque.   Objective  L neck: 0.8cm hyperkeratotic pap  Objective  face x 15 (15): Pink scaly macules    Assessment & Plan   Actinic Damage - diffuse scaly erythematous macules with underlying dyspigmentation - Recommend daily broad spectrum sunscreen SPF 30+ to sun-exposed areas, reapply every 2 hours as needed.  - Call for new or changing lesions.   Inflamed seborrheic keratosis (21) Back x 21  Destruction of lesion - Back x 21 Complexity: simple   Destruction method: cryotherapy   Informed consent: discussed and consent obtained   Timeout:  patient name, date of birth, surgical site, and procedure verified Lesion destroyed using liquid nitrogen: Yes   Region frozen until ice ball extended beyond lesion: Yes   Outcome: patient tolerated procedure well with no complications   Post-procedure details: wound care instructions given    Neoplasm of skin L neck  Epidermal / dermal shaving  Lesion diameter (cm):  0.8 Informed consent: discussed and consent obtained   Timeout: patient name, date of birth, surgical site, and procedure verified   Procedure prep:  Patient was prepped and  draped in usual sterile fashion Prep type:  Isopropyl alcohol Anesthesia: the lesion was anesthetized in a standard fashion   Anesthetic:  1% lidocaine w/ epinephrine 1-100,000 buffered w/ 8.4% NaHCO3 Instrument used: flexible razor blade   Hemostasis achieved with: pressure, aluminum chloride and electrodesiccation   Outcome: patient tolerated procedure well   Post-procedure details: sterile dressing applied and wound care instructions given   Dressing type: bandage and petrolatum    Destruction of lesion Complexity: extensive   Destruction method: electrodesiccation and curettage   Informed consent: discussed and consent obtained   Timeout:  patient name, date of birth, surgical site, and procedure verified Procedure prep:  Patient was prepped and draped in usual sterile fashion Prep type:  Isopropyl alcohol Anesthesia: the lesion was anesthetized in a standard fashion   Anesthetic:  1% lidocaine w/ epinephrine 1-100,000 buffered w/ 8.4% NaHCO3 Curettage performed in three different directions: Yes   Electrodesiccation performed over the curetted area: Yes   Lesion length (cm):  0.8 Lesion width (cm):  0.8 Margin per side (cm):  0.2 Final wound size (cm):  1.2 Hemostasis achieved with:  pressure, aluminum chloride and electrodesiccation Outcome: patient tolerated procedure well with no complications   Post-procedure details: sterile dressing applied and wound care instructions given   Dressing type: bandage and petrolatum    Specimen 1 - Surgical pathology Differential Diagnosis: D48.5 R/O SCC Check Margins: No 0.8cm hyperkeratotic pap EDC today  AK (actinic keratosis) (15) face x 15  Destruction of lesion - face  x 15 Complexity: simple   Destruction method: cryotherapy   Informed consent: discussed and consent obtained   Timeout:  patient name, date of birth, surgical site, and procedure verified Lesion destroyed using liquid nitrogen: Yes   Region frozen until ice  ball extended beyond lesion: Yes   Outcome: patient tolerated procedure well with no complications   Post-procedure details: wound care instructions given    SCC (squamous cell carcinoma), scalp/neck  Return in about 6 months (around 08/14/2020).  I, Othelia Pulling, RMA, am acting as scribe for Sarina Ser, MD .  Documentation: I have reviewed the above documentation for accuracy and completeness, and I agree with the above.  Sarina Ser, MD

## 2020-02-14 ENCOUNTER — Encounter: Payer: Self-pay | Admitting: Dermatology

## 2020-02-17 ENCOUNTER — Telehealth: Payer: Self-pay

## 2020-02-17 NOTE — Telephone Encounter (Signed)
-----   Message from Ralene Bathe, MD sent at 02/17/2020  9:13 AM EDT ----- Skin , left neck SQUAMOUS CELL CARCINOMA, KERATOACANTHOMA TYPE  Cancer - SCC Already treated Recheck next visit

## 2020-02-17 NOTE — Telephone Encounter (Signed)
Patient informed of results.  

## 2020-02-18 ENCOUNTER — Telehealth: Payer: Self-pay

## 2020-02-18 NOTE — Telephone Encounter (Signed)
Patient was contacted and scheduled for BCG instillations starting 03/05/20 and cysto 62mo post. Patient was given verbal instruction and verbalized understanding

## 2020-02-18 NOTE — Telephone Encounter (Signed)
-----   Message from Abbie Sons, MD sent at 01/30/2020  3:07 PM EDT ----- Regarding: BCG This is a patient who only got 2 BCG doses who I recently rebiopsied.  Thanks ----- Message ----- From: Interface, [facility] Sent: 01/21/2020  12:18 PM EDT To: Abbie Sons, MD

## 2020-02-25 ENCOUNTER — Telehealth: Payer: Self-pay

## 2020-02-25 NOTE — Telephone Encounter (Signed)
Apt was moved to the end of his treatment cycle, patient was notified

## 2020-02-25 NOTE — Telephone Encounter (Signed)
Incoming call on triage line from patient wanting to cancel his BCG on 8/27 due to being out of town for a funeral and would like to reschedule.

## 2020-03-05 ENCOUNTER — Ambulatory Visit: Payer: Self-pay | Admitting: Physician Assistant

## 2020-03-12 ENCOUNTER — Ambulatory Visit: Payer: Medicare HMO | Admitting: Physician Assistant

## 2020-03-12 ENCOUNTER — Other Ambulatory Visit: Payer: Self-pay

## 2020-03-12 DIAGNOSIS — C679 Malignant neoplasm of bladder, unspecified: Secondary | ICD-10-CM

## 2020-03-12 LAB — URINALYSIS, COMPLETE
Bilirubin, UA: NEGATIVE
Glucose, UA: NEGATIVE
Leukocytes,UA: NEGATIVE
Nitrite, UA: NEGATIVE
Specific Gravity, UA: >1.03 — ABNORMAL HIGH (ref 1.005–1.030)
Urobilinogen, Ur: 1 mg/dL (ref 0.2–1.0)
pH, UA: 6 (ref 5.0–7.5)

## 2020-03-12 LAB — MICROSCOPIC EXAMINATION
Bacteria, UA: NONE SEEN
RBC, Urine: >30 /hpf — AB (ref 0–2)

## 2020-03-12 NOTE — Progress Notes (Signed)
Patient presented to clinic today to restart BCG induction series that was previously complicated by multiple episodes of gross hematuria and grossly infected urine.  Gross hematuria again noted today; treatment deferred.  Will notify Dr. Bernardo Heater and to discuss possible refulguration versus holding Eliquis in advance of treatment. Patient expressed understanding.

## 2020-03-16 ENCOUNTER — Ambulatory Visit
Admission: RE | Admit: 2020-03-16 | Discharge: 2020-03-16 | Disposition: A | Discharge status: Home / Self Care | Payer: Medicare HMO | Source: Ambulatory Visit | Attending: Internal Medicine | Admitting: Internal Medicine

## 2020-03-16 ENCOUNTER — Other Ambulatory Visit: Payer: Self-pay

## 2020-03-16 DIAGNOSIS — K669 Disorder of peritoneum, unspecified: Secondary | ICD-10-CM

## 2020-03-16 DIAGNOSIS — K668 Other specified disorders of peritoneum: Secondary | ICD-10-CM | POA: Insufficient documentation

## 2020-03-16 LAB — POCT I-STAT CREATININE: Creatinine, Ser: 1.1 mg/dL (ref 0.61–1.24)

## 2020-03-16 IMAGING — CT CT ABD-PELV W/ CM
2 of 5 series · 15 of 46 positions shown, 17 images · IV contrast (APPLIED)
Comparison: CT scan [DATE]

CLINICAL DATA: Restaging bladder cancer.

EXAM:
CT ABDOMEN AND PELVIS WITH CONTRAST
TECHNIQUE: Multidetector CT imaging of the abdomen and pelvis was performed
using the standard protocol following bolus administration of
intravenous contrast.
CONTRAST:  100mL OMNIPAQUE IOHEXOL 300 MG/ML  SOLN

[Series 2: routine abd/pel with · axial · 0.79mm/px · z∈[-500,-75]mm · 12 of 97 slices shown, 14 images]
[im 6/97  soft-tissue]
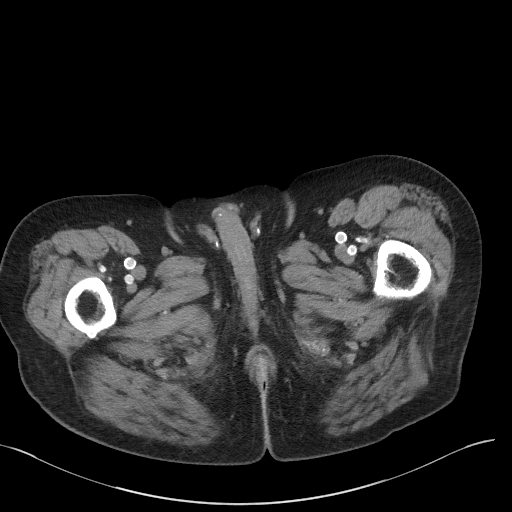
[im 6/97  bone]
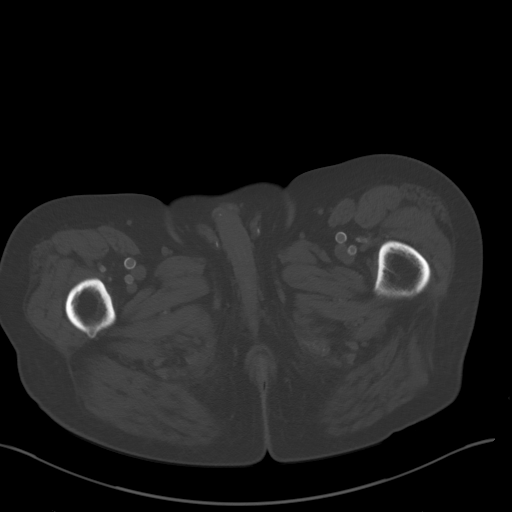
[im 16/97  soft-tissue]
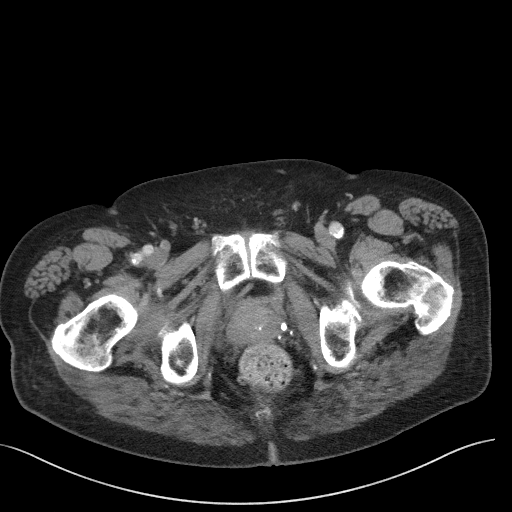
[im 21/97  soft-tissue]
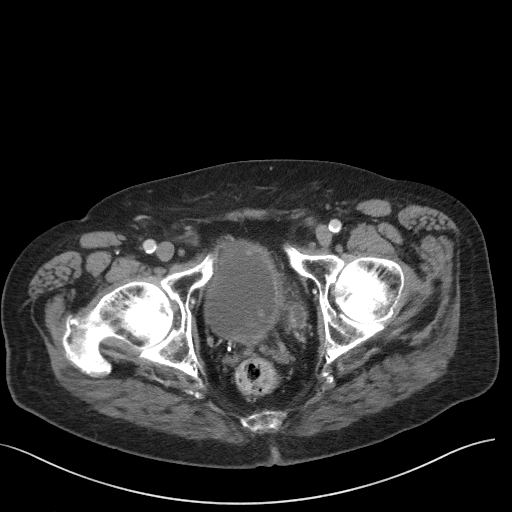
[im 31/97  soft-tissue]
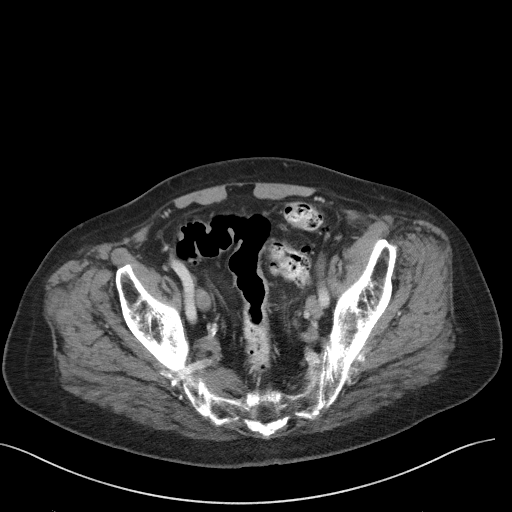
[im 36/97  soft-tissue]
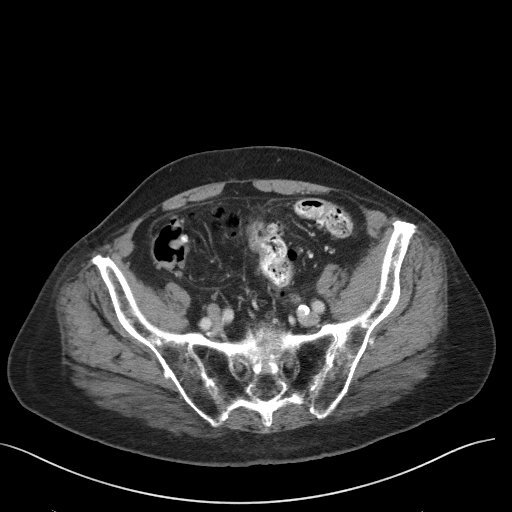
[im 46/97  soft-tissue]
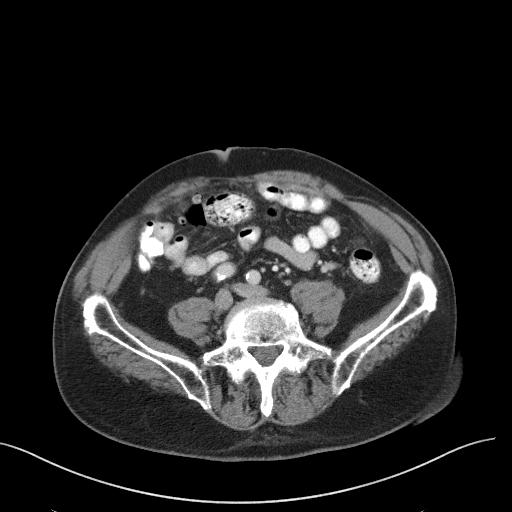
[im 51/97  soft-tissue]
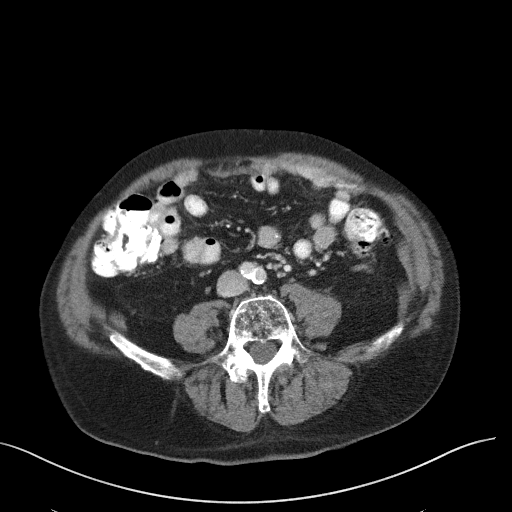
[im 61/97  soft-tissue]
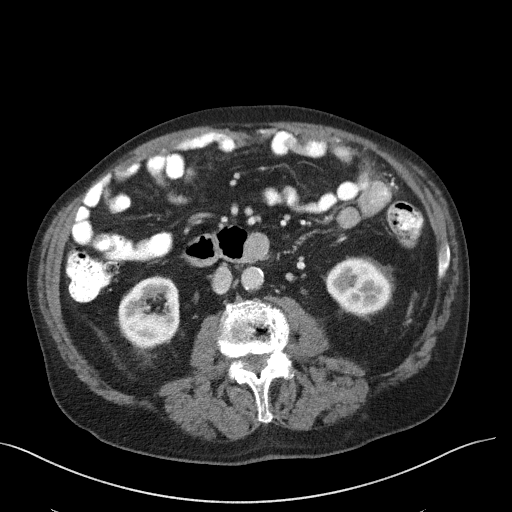
[im 66/97  soft-tissue]
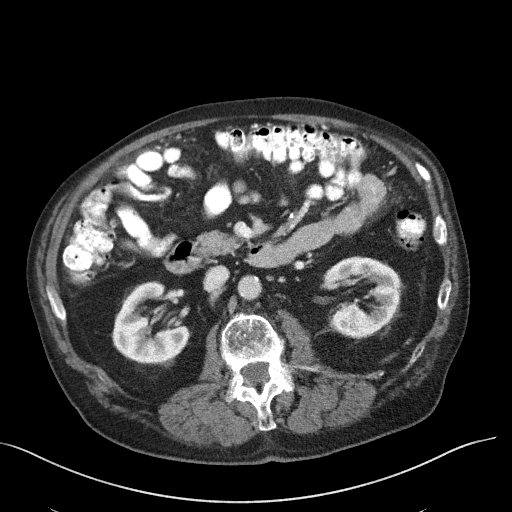
[im 66/97  bone]
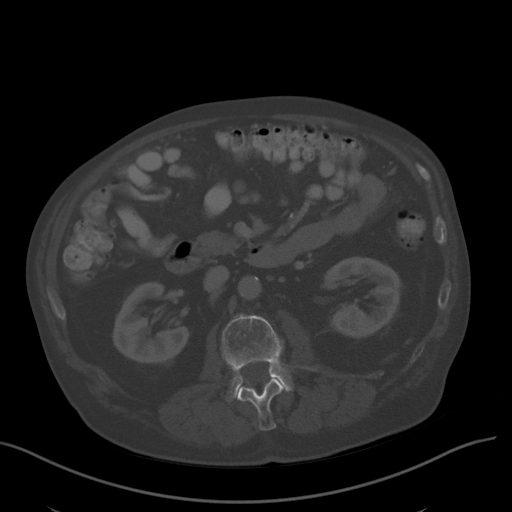
[im 76/97  soft-tissue]
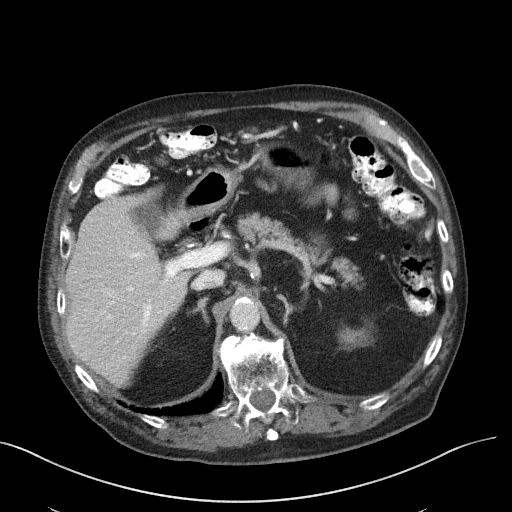
[im 81/97  soft-tissue]
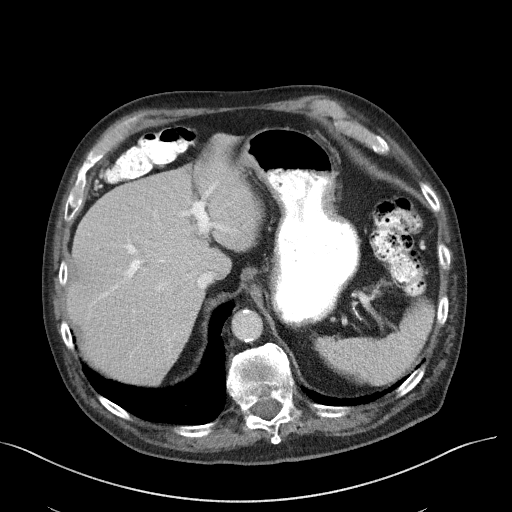
[im 91/97  soft-tissue]
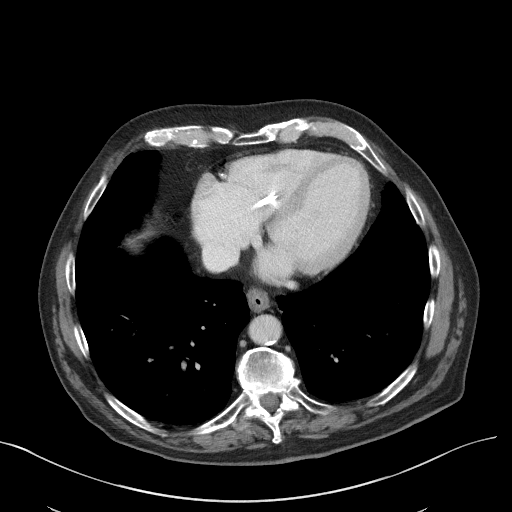

[Series 5: coronal st · coronal · 0.79mm/px · 3 of 95 slices shown]
[im 32/95  soft-tissue]
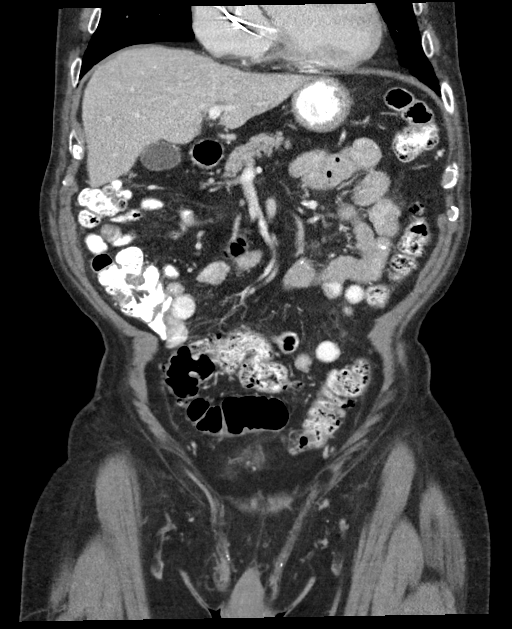
[im 42/95  soft-tissue]
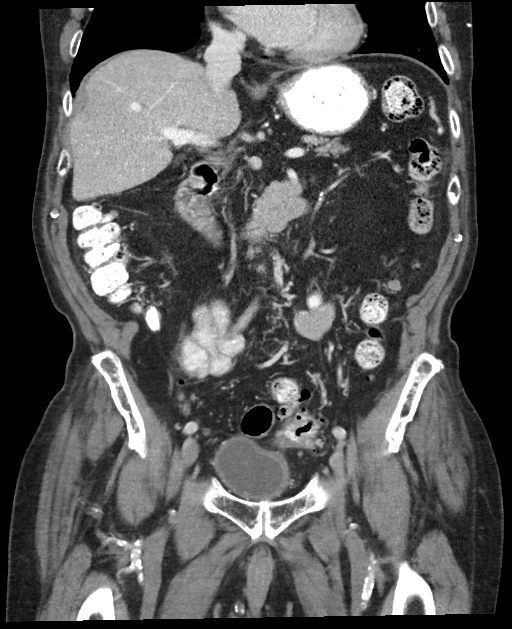
[im 53/95  soft-tissue]
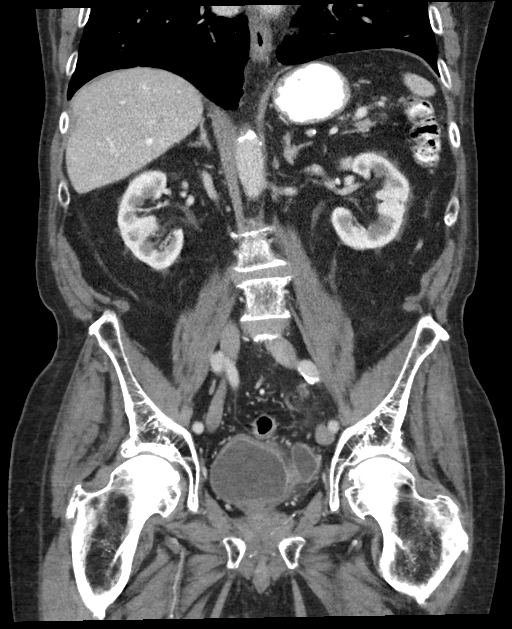

[15 of 46 positions shown; findings below may reference images not displayed]

FINDINGS: Lower chest: The heart is within normal limits in size for age and
stable. Stable pacer wires. Stable aortic and coronary artery
calcifications. No pericardial effusion. No worrisome pulmonary
nodules or pleural effusions.

Hepatobiliary: No hepatic lesions or intrahepatic biliary
dilatation. The gallbladder appears normal. No common bile duct
dilatation.

Pancreas: No mass, inflammation or ductal dilatation.

Spleen: Normal size.  No focal lesions.

Adrenals/Urinary Tract: The adrenal glands are unremarkable.

No worrisome renal lesions or hydronephrosis. The delayed images do
not demonstrate any significant collecting system abnormalities.

Large left-sided bladder diverticulum is again noted. There appeared
to be a small enhancing lesion in the diverticula on the prior study
but I do not see this for certain on today's study. There are
however other small enhancing bladder lesions worrisome for small
transitional cell carcinomas. Recommend cystoscopic correlation.

Stomach/Bowel: The stomach, duodenum, small bowel and colon are
grossly no. No acute inflammatory process, mass lesions or
obstructive findings. Stable colonic diverticulosis.

Vascular/Lymphatic: Vascular moderate atherosclerotic calcifications
involving the aorta iliac arteries. The major venous structures are
patent.

Small scattered mesenteric and retroperitoneal lymph nodes appears
stable.

Stable appearing vague soft tissue nodularity involving the omentum.
No new or progressive findings are identified.

Reproductive: The prostate gland and seminal vesicles are
unremarkable.

Other: Abdomen other no pelvic mass or pelvic adenopathy. No free
pelvic fluid collections. No inguinal mass or hernia.

Musculoskeletal: Musculoskeletal no significant bony findings.
Stable remote T11 compression fracture.
IMPRESSION: 1. Stable appearing large left-sided bladder diverticulum. The
enhancing nodule within the diverticulum on the prior study is no
longer visualized.
2. Multiple new small enhancing bladder lesions worrisome for small
transitional cell carcinomas. Recommend cystoscopic correlation.
3. Stable vague soft tissue nodularity involving the omentum.
4. No findings for metastatic adenopathy involving the
abdomen/pelvis.
5. Aortic atherosclerosis.

Aortic Atherosclerosis ([WN]-[WN]).

## 2020-03-16 MED ORDER — IOHEXOL 300 MG/ML  SOLN
100.0000 mL | Freq: Once | INTRAMUSCULAR | Status: AC | PRN
  Administered 2020-03-16: 100 mL via INTRAVENOUS

## 2020-03-18 ENCOUNTER — Ambulatory Visit: Payer: Medicare HMO | Admitting: Internal Medicine

## 2020-03-18 ENCOUNTER — Encounter: Payer: Self-pay | Admitting: Internal Medicine

## 2020-03-18 ENCOUNTER — Other Ambulatory Visit: Payer: Medicare HMO

## 2020-03-19 ENCOUNTER — Other Ambulatory Visit: Payer: Self-pay

## 2020-03-19 ENCOUNTER — Inpatient Hospital Stay: Payer: Medicare HMO | Attending: Internal Medicine

## 2020-03-19 ENCOUNTER — Ambulatory Visit: Payer: Self-pay | Admitting: Physician Assistant

## 2020-03-19 ENCOUNTER — Inpatient Hospital Stay (HOSPITAL_BASED_OUTPATIENT_CLINIC_OR_DEPARTMENT_OTHER): Payer: Medicare HMO | Admitting: Internal Medicine

## 2020-03-19 ENCOUNTER — Telehealth: Payer: Self-pay

## 2020-03-19 ENCOUNTER — Encounter: Payer: Self-pay | Admitting: Physician Assistant

## 2020-03-19 ENCOUNTER — Inpatient Hospital Stay: Payer: Medicare HMO

## 2020-03-19 VITALS — BP 157/79 | HR 60

## 2020-03-19 DIAGNOSIS — N323 Diverticulum of bladder: Secondary | ICD-10-CM | POA: Insufficient documentation

## 2020-03-19 DIAGNOSIS — Z79899 Other long term (current) drug therapy: Secondary | ICD-10-CM | POA: Insufficient documentation

## 2020-03-19 DIAGNOSIS — I10 Essential (primary) hypertension: Secondary | ICD-10-CM | POA: Insufficient documentation

## 2020-03-19 DIAGNOSIS — D500 Iron deficiency anemia secondary to blood loss (chronic): Secondary | ICD-10-CM | POA: Insufficient documentation

## 2020-03-19 DIAGNOSIS — Z7901 Long term (current) use of anticoagulants: Secondary | ICD-10-CM | POA: Insufficient documentation

## 2020-03-19 DIAGNOSIS — M8580 Other specified disorders of bone density and structure, unspecified site: Secondary | ICD-10-CM | POA: Insufficient documentation

## 2020-03-19 DIAGNOSIS — R5383 Other fatigue: Secondary | ICD-10-CM | POA: Insufficient documentation

## 2020-03-19 DIAGNOSIS — R5381 Other malaise: Secondary | ICD-10-CM | POA: Diagnosis not present

## 2020-03-19 DIAGNOSIS — E785 Hyperlipidemia, unspecified: Secondary | ICD-10-CM | POA: Insufficient documentation

## 2020-03-19 DIAGNOSIS — C679 Malignant neoplasm of bladder, unspecified: Secondary | ICD-10-CM | POA: Diagnosis not present

## 2020-03-19 DIAGNOSIS — N400 Enlarged prostate without lower urinary tract symptoms: Secondary | ICD-10-CM | POA: Insufficient documentation

## 2020-03-19 DIAGNOSIS — Z85828 Personal history of other malignant neoplasm of skin: Secondary | ICD-10-CM | POA: Insufficient documentation

## 2020-03-19 DIAGNOSIS — Z950 Presence of cardiac pacemaker: Secondary | ICD-10-CM | POA: Diagnosis not present

## 2020-03-19 DIAGNOSIS — R3129 Other microscopic hematuria: Secondary | ICD-10-CM | POA: Diagnosis not present

## 2020-03-19 DIAGNOSIS — K669 Disorder of peritoneum, unspecified: Secondary | ICD-10-CM

## 2020-03-19 DIAGNOSIS — Z7982 Long term (current) use of aspirin: Secondary | ICD-10-CM | POA: Diagnosis not present

## 2020-03-19 DIAGNOSIS — R911 Solitary pulmonary nodule: Secondary | ICD-10-CM | POA: Diagnosis not present

## 2020-03-19 LAB — COMPREHENSIVE METABOLIC PANEL
ALT: 25 U/L (ref 0–44)
AST: 25 U/L (ref 15–41)
Albumin: 3.6 g/dL (ref 3.5–5.0)
Alkaline Phosphatase: 63 U/L (ref 38–126)
Anion gap: 8 (ref 5–15)
BUN: 18 mg/dL (ref 8–23)
CO2: 28 mmol/L (ref 22–32)
Calcium: 8.9 mg/dL (ref 8.9–10.3)
Chloride: 104 mmol/L (ref 98–111)
Creatinine, Ser: 1.06 mg/dL (ref 0.61–1.24)
GFR calc Af Amer: >60 mL/min (ref >60)
GFR calc non Af Amer: >60 mL/min (ref >60)
Glucose, Bld: 118 mg/dL — ABNORMAL HIGH (ref 70–99)
Potassium: 4.1 mmol/L (ref 3.5–5.1)
Sodium: 140 mmol/L (ref 135–145)
Total Bilirubin: 1.3 mg/dL — ABNORMAL HIGH (ref 0.3–1.2)
Total Protein: 6.7 g/dL (ref 6.5–8.1)

## 2020-03-19 LAB — CBC WITH DIFFERENTIAL/PLATELET
Abs Immature Granulocytes: 0.02 10*3/uL (ref 0.00–0.07)
Basophils Absolute: 0 10*3/uL (ref 0.0–0.1)
Basophils Relative: 1 %
Eosinophils Absolute: 0.2 10*3/uL (ref 0.0–0.5)
Eosinophils Relative: 4 %
HCT: 36.8 % — ABNORMAL LOW (ref 39.0–52.0)
Hemoglobin: 12.1 g/dL — ABNORMAL LOW (ref 13.0–17.0)
Immature Granulocytes: 0 %
Lymphocytes Relative: 16 %
Lymphs Abs: 0.9 10*3/uL (ref 0.7–4.0)
MCH: 30.9 pg (ref 26.0–34.0)
MCHC: 32.9 g/dL (ref 30.0–36.0)
MCV: 93.9 fL (ref 80.0–100.0)
Monocytes Absolute: 0.9 10*3/uL (ref 0.1–1.0)
Monocytes Relative: 16 %
Neutro Abs: 3.4 10*3/uL (ref 1.7–7.7)
Neutrophils Relative %: 63 %
Platelets: 209 10*3/uL (ref 150–400)
RBC: 3.92 MIL/uL — ABNORMAL LOW (ref 4.22–5.81)
RDW: 15.1 % (ref 11.5–15.5)
WBC: 5.4 10*3/uL (ref 4.0–10.5)
nRBC: 0 % (ref 0.0–0.2)

## 2020-03-19 MED ORDER — IRON SUCROSE 20 MG/ML IV SOLN
200.0000 mg | Freq: Once | INTRAVENOUS | Status: AC
  Administered 2020-03-19: 200 mg via INTRAVENOUS
  Filled 2020-03-19: qty 10

## 2020-03-19 MED ORDER — SODIUM CHLORIDE 0.9 % IV SOLN: 200.0000 mg | Freq: Once | INTRAVENOUS | Status: DC

## 2020-03-19 MED ORDER — SODIUM CHLORIDE 0.9 % IV SOLN
Freq: Once | INTRAVENOUS | Status: AC
  Filled 2020-03-19: qty 250

## 2020-03-19 NOTE — Assessment & Plan Note (Addendum)
#  Iron deficiency anemia secondary to chronic blood loss  [see below]-06/03- Hb 8.5; ferritin 15 [PCP]-currently on p.o. iron.  S/p  IV Venofer.  Hemoglobin- 12.1  Proceed with Venofer today.  #Hematuria  microscopic chronic-status post recent cystoscopy high-grade superficial malignancy;   # Omental/peritoneal thickening-unclear etiology; follow-up CT scan SEP 8th 2021--shows STABLE progressive omental/peritoneal thickening.  # Lung nodule-/NISP- will repeat imaging in spring 2022.   #High-grade noninvasive urothelial malignancy/hematuria-status post BCG x1; interrupted because of ongoing hematuria.  CT scan September 2021-shows nodules in the bladder-suspicious for recurrent malignancy.  Discussed with Dr Bernardo Heater.  Recommends evaluation at [facility].   # DISPOSITION: # venofer infusion today # follow up in 3 months  MD; labs; cbc/bmp; Possible Venofer-Dr.B  # I reviewed the blood work- with the patient in detail; also reviewed the imaging independently [as summarized above]; and with the patient in detail.

## 2020-03-19 NOTE — Patient Instructions (Signed)
Please call Dr.Stoioff's office next week for follow up re: recent CT scan findings.

## 2020-03-19 NOTE — Telephone Encounter (Signed)
Please contact the patient and inform him that Dr. Bernardo Heater never heard back from cardiology regarding holding Eliquis around the time of his scheduled BCG installations.  We are going to send a cardiac clearance request to his cardiologist regarding this and will keep him posted on the response.  In the meantime, he should keep his existing BCG appointments and continue to call us prior if he is having gross hematuria that day.

## 2020-03-19 NOTE — Progress Notes (Signed)
[place] NOTE  Patient Care Team: Ezequiel Kayser, MD as PCP - General (Internal Medicine)  CHIEF COMPLAINTS/PURPOSE OF CONSULTATION: Peritoneal thickening/IDA  Oncology History Overview Note  # NOV 2020- [incidental/hematuria work-up]-subtle peritoneal thickening/plaque-like lesions; March 2021-CT scan progressive omental/peritoneal thickening  #Microscopic hematuria- sec to BPH [cystoscopy/CT urogram negative; Dr.Stoiff]; March 2021 CT scan-new lesion in the bladder diverticulum  # MAY-June 2021- severe IDA sec to hemauria [hb 8.5; Ferritin- ]  # on eliquis ? A.fib/pacemaker;   Urothelial carcinoma of bladder ([place])  10/29/2019 Initial Diagnosis   Urothelial carcinoma of bladder ([facility])      HISTORY OF PRESENTING ILLNESS:  Christopher [name] 86 y.o.  male with a history of peritoneal/omental thickening of unclear etiology; also superficial bladder malignancy/hematuria/review results of the CT scan.  Patient continues to have intermittent blood in urine; unable to get BCG installations.  However admits to improvement of energy levels post IV iron infusion.  Otherwise no blood in stools or black or stools.   Review of Systems  Constitutional: Positive for malaise/fatigue. Negative for chills, diaphoresis, fever and weight loss.  HENT: Negative for nosebleeds and sore throat.   Eyes: Negative for double vision.  Respiratory: Positive for shortness of breath. Negative for cough, hemoptysis, sputum production and wheezing.   Cardiovascular: Negative for chest pain, palpitations, orthopnea and leg swelling.  Gastrointestinal: Negative for abdominal pain, blood in stool, constipation, diarrhea, heartburn, melena, nausea and vomiting.  Genitourinary: Positive for hematuria. Negative for dysuria, frequency and urgency.  Musculoskeletal: Negative for back pain and joint pain.  Skin: Negative.  Negative for itching and rash.  Neurological: Negative for dizziness,  tingling, focal weakness, weakness and headaches.  Endo/Heme/Allergies: Does not bruise/bleed easily.  Psychiatric/Behavioral: Negative for depression. The patient is not nervous/anxious and does not have insomnia.      MEDICAL HISTORY:  Past Medical History:  Diagnosis Date  . BPH (benign prostatic hyperplasia)   . Cancer ([place])    skin cancer on face carcinoma  . ED (erectile dysfunction)   . Gilbert disease   . HLD (hyperlipidemia)   . Hypertension   . Macular degeneration   . Osteopenia   . Presence of permanent cardiac pacemaker   . Sensorineural hearing loss     SURGICAL HISTORY: Past Surgical History:  Procedure Laterality Date  . CATARACT EXTRACTION Bilateral   . CYSTOSCOPY WITH BIOPSY N/A 01/20/2020   Procedure: CYSTOSCOPY WITH BIOPSY;  Surgeon: Abbie Sons, MD;  Location: [facility] ORS;  Service: Urology;  Laterality: N/A;  . CYSTOSCOPY WITH FULGERATION N/A 01/20/2020   Procedure: CYSTOSCOPY WITH FULGERATION;  Surgeon: Abbie Sons, MD;  Location: [facility] OR[facility];  Service: Urology;  Laterality: N/A;  . HEMORRHOIDECTOMY WITH HEMORRHOID BANDING    . KNEE ARTHROSCOPY Left   . PACEMAKER INSERTION N/A 03/03/2015   Procedure: INSERTION DUAL LEAD PACEMAKER;  Surgeon: Isaias Cowman, MD;  Location: [facility] OR[facility];  Service: Cardiovascular;  Laterality: N/A;  . TONSILLECTOMY    . TRANSURETHRAL RESECTION OF BLADDER TUMOR N/A 10/07/2019   Procedure: TRANSURETHRAL RESECTION OF BLADDER TUMOR (TURBT);  Surgeon: Abbie Sons, MD;  Location: [facility];  Service: Urology;  Laterality: N/A;    SOCIAL HISTORY: Social History   Socioeconomic History  . Marital status: Widowed    Spouse name: Not on file  . Number of children: Not on file  . Years of education: Not on file  . Highest education level: Not on file  Occupational History  . Not on file  Tobacco Use  . Smoking status: Never Smoker  . Smokeless tobacco: Never Used  Vaping Use  . Vaping Use: Never used  Substance and  Sexual Activity  . Alcohol use: Yes    Alcohol/week: 7.0 standard drinks    Types: 7 Glasses of wine per week  . Drug use: No  . Sexual activity: Yes    Birth control/protection: None  Other Topics Concern  . Not on file  Social History Narrative   Retd. Civil engineer, contracting; [place]; self. Never smoked; alcohol- glass wine/drink every night.     Social Determinants of Health   Financial Resource Strain:   . Difficulty of Paying Living Expenses: Not on file  Food Insecurity:   . Worried About Charity fundraiser in the Last Year: Not on file  . Ran Out of Food in the Last Year: Not on file  Transportation Needs:   . Lack of Transportation (Medical): Not on file  . Lack of Transportation (Non-Medical): Not on file  Physical Activity:   . Days of Exercise per Week: Not on file  . Minutes of Exercise per Session: Not on file  Stress:   . Feeling of Stress : Not on file  Social Connections:   . Frequency of Communication with Friends and Family: Not on file  . Frequency of Social Gatherings with Friends and Family: Not on file  . Attends Religious Services: Not on file  . Active Member of Clubs or Organizations: Not on file  . Attends Archivist Meetings: Not on file  . Marital Status: Not on file  Intimate Partner Violence:   . Fear of Current or Ex-Partner: Not on file  . Emotionally Abused: Not on file  . Physically Abused: Not on file  . Sexually Abused: Not on file    FAMILY HISTORY: Family History  Problem Relation Age of Onset  . Diabetes Mother   . Lung cancer Father   . Cancer Paternal Uncle   . Prostate cancer Neg Hx   . Bladder Cancer Neg Hx   . Kidney cancer Neg Hx     ALLERGIES:  is allergic to atenolol.  MEDICATIONS:  Current Outpatient Medications  Medication Sig Dispense Refill  . acetaminophen (TYLENOL) 500 MG tablet Take 1,000 mg by mouth every 6 (six) hours as needed for moderate pain.     Marland Kitchen alfuzosin (UROXATRAL) 10 MG 24 hr tablet Take  1 tablet (10 mg total) by mouth daily with breakfast. 90 tablet 3  . apixaban (ELIQUIS) 5 MG TABS tablet Take 5 mg by mouth 2 (two) times daily.     Marland Kitchen aspirin EC 81 MG tablet Take 81 mg by mouth daily.     Marland Kitchen atorvastatin (LIPITOR) 10 MG tablet Take 10 mg by mouth daily.     . calcium carbonate (OSCAL) 1500 (600 Ca) MG TABS tablet Take 600 mg of elemental calcium by mouth daily with breakfast.    . cholecalciferol (VITAMIN D) 25 MCG (1000 UNIT) tablet Take 1,000 Units by mouth daily.     . ferrous fumarate (HEMOCYTE - 106 MG FE) 325 (106 Fe) MG TABS tablet Take 325 mg of iron by mouth in the morning and at bedtime.    . finasteride (PROSCAR) 5 MG tablet Take 1 tablet (5 mg total) by mouth daily. 90 tablet 3  . metoprolol succinate (TOPROL-XL) 25 MG 24 hr tablet Take 25 mg by mouth daily.    . metoprolol succinate (TOPROL-XL) 50 MG 24 hr tablet  Take 50 mg by mouth daily.     . Multiple Vitamins-Minerals (MENS MULTIVITAMIN PO) Take 1 tablet by mouth daily.    . Multiple Vitamins-Minerals (PRESERVISION AREDS 2 PO) Take 1 tablet by mouth in the morning and at bedtime.    . Omega-3 Fatty Acids (FISH OIL) 1000 MG CAPS Take 1,000 mg by mouth daily.      No current facility-administered medications for this visit.      Marland Kitchen  PHYSICAL EXAMINATION: ECOG PERFORMANCE STATUS: 0 - Asymptomatic  Vitals:   03/19/20 1313  BP: (!) 160/82  Pulse: 69  Resp: 16  Temp: 97.6 F (36.4 C)  SpO2: 98%   Filed Weights   03/19/20 1313  Weight: 198 lb (89.8 kg)    Physical Exam HENT:     Head: Normocephalic and atraumatic.     Mouth/Throat:     Pharynx: No oropharyngeal exudate.  Eyes:     Pupils: Pupils are equal, round, and reactive to light.  Cardiovascular:     Rate and Rhythm: Normal rate and regular rhythm.  Pulmonary:     Effort: No respiratory distress.     Breath sounds: No wheezing.  Abdominal:     General: Bowel sounds are normal. There is no distension.     Palpations: Abdomen is soft.  There is no mass.     Tenderness: There is no abdominal tenderness. There is no guarding or rebound.  Musculoskeletal:        General: No tenderness. Normal range of motion.     Cervical back: Normal range of motion and neck supple.  Skin:    General: Skin is warm.  Neurological:     Mental Status: He is [name] and oriented to person, place, and time.  Psychiatric:        Mood and Affect: Affect normal.       LABORATORY DATA:  I have reviewed the data as listed Lab Results  Component Value Date   WBC 5.4 03/19/2020   HGB 12.1 (L) 03/19/2020   HCT 36.8 (L) 03/19/2020   MCV 93.9 03/19/2020   PLT 209 03/19/2020   Recent Labs    01/13/20 1412 01/13/20 1412 01/16/20 1305 03/16/20 1028 03/19/20 1256  NA 141  --  141  --  140  K 4.6  --  3.7  --  4.1  CL 102  --  103  --  104  CO2 26  --  30  --  28  GLUCOSE 92  --  110*  --  118*  BUN 18  --  24*  --  18  CREATININE 0.97   < > 1.05 1.10 1.06  CALCIUM 9.6  --  9.1  --  8.9  GFRNONAA 69  --  >60  --  >60  GFRAA 80  --  >60  --  >60  PROT  --   --   --   --  6.7  ALBUMIN  --   --   --   --  3.6  AST  --   --   --   --  25  ALT  --   --   --   --  25  ALKPHOS  --   --   --   --  63  BILITOT  --   --   --   --  1.3*   < > = values in this interval not displayed.    RADIOGRAPHIC STUDIES: I have personally reviewed  the radiological images as listed and agreed with the findings in the report. CT ABDOMEN PELVIS W CONTRAST  Result Date: 03/16/2020 CLINICAL DATA:  Restaging bladder cancer. EXAM: CT ABDOMEN AND PELVIS WITH CONTRAST TECHNIQUE: Multidetector CT imaging of the abdomen and pelvis was performed using the standard protocol following bolus administration of intravenous contrast. CONTRAST:  116mL OMNIPAQUE IOHEXOL 300 MG/ML  SOLN COMPARISON:  CT scan 09/10/2019 FINDINGS: Lower chest: The heart is within normal limits in size for age and stable. Stable pacer wires. Stable aortic and coronary artery calcifications. No  pericardial effusion. No worrisome pulmonary nodules or pleural effusions. Hepatobiliary: No hepatic lesions or intrahepatic biliary dilatation. The gallbladder appears normal. No common bile duct dilatation. Pancreas: No mass, inflammation or ductal dilatation. Spleen: Normal size.  No focal lesions. Adrenals/Urinary Tract: The adrenal glands are unremarkable. No worrisome renal lesions or hydronephrosis. The delayed images do not demonstrate any significant collecting system abnormalities. Large left-sided bladder diverticulum is again noted. There appeared to be a small enhancing lesion in the diverticula on the prior study but I do not see this for certain on today's study. There are however other small enhancing bladder lesions worrisome for small transitional cell carcinomas. Recommend cystoscopic correlation. Stomach/Bowel: The stomach, duodenum, small bowel and colon are grossly no. No acute inflammatory process, mass lesions or obstructive findings. Stable colonic diverticulosis. Vascular/Lymphatic: Vascular moderate atherosclerotic calcifications involving the aorta iliac arteries. The major venous structures are patent. Small scattered mesenteric and retroperitoneal lymph nodes appears stable. Stable appearing vague soft tissue nodularity involving the omentum. No new or progressive findings are identified. Reproductive: The prostate gland and seminal vesicles are unremarkable. Other: Abdomen other no pelvic mass or pelvic adenopathy. No free pelvic fluid collections. No inguinal mass or hernia. Musculoskeletal: Musculoskeletal no significant bony findings. Stable remote T11 compression fracture. IMPRESSION: 1. Stable appearing large left-sided bladder diverticulum. The enhancing nodule within the diverticulum on the prior study is no longer visualized. 2. Multiple new small enhancing bladder lesions worrisome for small transitional cell carcinomas. Recommend cystoscopic correlation. 3. Stable vague soft  tissue nodularity involving the omentum. 4. No findings for metastatic adenopathy involving the abdomen/pelvis. 5. Aortic atherosclerosis. Aortic Atherosclerosis (ICD10-I70.0). Electronically Signed   By: Marijo Sanes M.D.   On: 03/16/2020 12:32    ASSESSMENT & PLAN:   Iron deficiency anemia due to chronic blood loss #Iron deficiency anemia secondary to chronic blood loss  [see below]-06/03- Hb 8.5; ferritin 15 [PCP]-currently on p.o. iron.  S/p  IV Venofer.  Hemoglobin- 12.1  Proceed with Venofer today.  #Hematuria  microscopic chronic-status post recent cystoscopy high-grade superficial malignancy;   # Omental/peritoneal thickening-unclear etiology; follow-up CT scan SEP 8th 2021--shows STABLE progressive omental/peritoneal thickening.  # Lung nodule-/NISP- will repeat imaging in spring 2022.   #High-grade noninvasive urothelial malignancy/hematuria-status post BCG x1; interrupted because of ongoing hematuria.  CT scan September 2021-shows nodules in the bladder-suspicious for recurrent malignancy.  Discussed with Dr Bernardo Heater.  Recommends evaluation at [facility].   # DISPOSITION: # venofer infusion today # follow up in 3 months  MD; labs; cbc/bmp; Possible Venofer-Dr.B  # I reviewed the blood work- with the patient in detail; also reviewed the imaging independently [as summarized above]; and with the patient in detail.    All questions were answered. The patient knows to call the clinic with any problems, questions or concerns.    Cammie Sickle, MD 03/23/2020 2:38 PM

## 2020-03-19 NOTE — Telephone Encounter (Signed)
Pt called triage line and states that he was scheduled for an appointment this morning but did not come because he was told not to come in if there was blood in his urine. Pt states that blood was visible this morning. Denies pain, n/v, fever. Last note mentions possibly d/c Eliquis, pt would like a call back with the next steps. Please advise.

## 2020-03-22 NOTE — Telephone Encounter (Signed)
Notified patient as advised, patient verbalized understanding.  

## 2020-03-26 ENCOUNTER — Other Ambulatory Visit: Payer: Self-pay | Admitting: Radiology

## 2020-03-26 ENCOUNTER — Ambulatory Visit: Payer: Medicare HMO | Admitting: Physician Assistant

## 2020-03-26 ENCOUNTER — Encounter: Payer: Self-pay | Admitting: Physician Assistant

## 2020-03-26 ENCOUNTER — Other Ambulatory Visit: Payer: Self-pay

## 2020-03-26 VITALS — BP 154/63 | HR 74 | Ht 71.0 in | Wt 195.0 lb

## 2020-03-26 DIAGNOSIS — D494 Neoplasm of unspecified behavior of bladder: Secondary | ICD-10-CM

## 2020-03-26 DIAGNOSIS — C679 Malignant neoplasm of bladder, unspecified: Secondary | ICD-10-CM | POA: Diagnosis not present

## 2020-03-26 NOTE — Progress Notes (Signed)
Patient presented to clinic today for BCG.  CTAP with contrast dated 03/16/2020 notable for enhancing nodular bladder lesions suggestive of recurrent bladder tumor.  In consultation with Dr. Bernardo Heater, I explained to the patient that we would defer BCG at this time with plans for cystoscopy under anesthesia with TURBT, anticipated date 04/13/2020.  Patient expressed understanding.  Booking sheet completed today.  Additionally, we did receive cardiac clearance for him to hold Eliquis around the time of BCG's to reduce his risk of gross hematuria that has delayed his treatments.  We will plan for this for future BCG installations.

## 2020-03-29 ENCOUNTER — Other Ambulatory Visit: Payer: Self-pay | Admitting: Urology

## 2020-03-29 LAB — URINALYSIS, COMPLETE
Bilirubin, UA: NEGATIVE
Glucose, UA: NEGATIVE
Ketones, UA: NEGATIVE
Nitrite, UA: NEGATIVE
Specific Gravity, UA: 1.025 (ref 1.005–1.030)
Urobilinogen, Ur: 0.2 mg/dL (ref 0.2–1.0)
pH, UA: 6 (ref 5.0–7.5)

## 2020-03-29 LAB — MICROSCOPIC EXAMINATION: RBC, Urine: >30 /hpf — AB (ref 0–2)

## 2020-04-01 ENCOUNTER — Other Ambulatory Visit: Payer: Self-pay

## 2020-04-01 DIAGNOSIS — D494 Neoplasm of unspecified behavior of bladder: Secondary | ICD-10-CM

## 2020-04-01 DIAGNOSIS — C679 Malignant neoplasm of bladder, unspecified: Secondary | ICD-10-CM

## 2020-04-02 ENCOUNTER — Encounter
Admission: RE | Admit: 2020-04-02 | Discharge: 2020-04-02 | Disposition: A | Discharge status: Home / Self Care | Payer: Medicare HMO | Source: Ambulatory Visit | Attending: Urology | Admitting: Urology

## 2020-04-02 ENCOUNTER — Other Ambulatory Visit: Payer: Self-pay

## 2020-04-02 ENCOUNTER — Ambulatory Visit: Payer: Self-pay | Admitting: Physician Assistant

## 2020-04-02 ENCOUNTER — Other Ambulatory Visit: Payer: Medicare HMO

## 2020-04-02 DIAGNOSIS — C679 Malignant neoplasm of bladder, unspecified: Secondary | ICD-10-CM

## 2020-04-02 NOTE — Pre-Procedure Instructions (Signed)
Progress Notes - documented in this encounter Fath, Aloha Gell, MD - 02/11/2020 10:45 AM EDT Formatting of this note is different from the original.   Chief Complaint: Chief Complaint  Patient presents with  . Follow-up  82mo  Date of Service: 02/11/2020 Date of Birth: 1927/09/26 PCP: Mancel Bale, MD  History of Present Illness: Mr. Christopher [name] is a 86 y.o.male patient returns for follow-up visit. He is doing fairly well from a cardiac standpoint. He has a history of high-grade heart block and has a dual-chamber permanent pacemaker in place. He has a history of paroxysmal atrial fibrillation. Interrogation in the recent past of his pacemaker revealed no episodes of A. fib. He is on 75 mg of metoprolol succinate and 5 mg twice daily of Eliquis. He is greater than 60 kg and his creatinine is less than 1.5. He more short of breath when he attempts to ambulate. He has not noted any sustained arrhythmia. He had a history of bradycardia now has back-up pacing. His device has proximately 5 years of battery life remaining. States he has shortness of breath with occasional wheezing.  Past Medical History Past Medical History:  Diagnosis Date  . ED (erectile dysfunction) 09/29/2013  . Gilbert syndrome 09/29/2013  . Glucose intolerance (impaired glucose tolerance) 11/14/2013  . HTN (hypertension) 09/29/2013  . Hypercholesteremia 09/29/2013  . Lesion of peritoneum 06/11/2019  Subtle peritoneal thickening/plaques noted as incidental finding on 05/30/2019 CT urogram, with no primary noted based. CEA and PSA normal 06/26/2019. He is to have follow-up CT 09/01/2019, per Dr. Rogue Bussing  . Lumbar degenerative disc disease 12/04/2018  . Macular degeneration 09/29/2013  . Osteopenia 09/29/2013  . Persistent atrial fibrillation (CMS-HCC) 10/11/2016  . SNHL (sensorineural hearing loss) 09/29/2013  . Symptomatic sinus bradycardia 03/10/2015  Status post permanent pacemaker  . Syncope and collapse 02/28/2015  .  Urothelial carcinoma of bladder (CMS-HCC) 10/07/2019  S/P TURBT by Dr. Bernardo Heater; to get intravesical BCG   Past Surgical History He has a past surgical history that includes Knee arthroscopy (Left, 2011); Cataract extraction (Bilateral); Tonsillectomy; hemorrhoidectomy by simple ligation; nasal endoscopy w/dacryocystorhinostomy (Right, 05/02/2019); intubation nasolacrimal duct (Right, 05/02/2019); ethmoidectomy intranasal anterior (Right, 05/02/2019); repair ectropion extensive (Bilateral, 05/02/2019); conjunctivoplasty w/graft/extensive rearrangement (Bilateral, 05/02/2019); nasal endoscopy w/dacryocystorhinostomy (Left, 05/16/2019); intubation nasolacrimal duct (Left, 05/16/2019); ethmoidectomy intranasal anterior (Left, 05/16/2019); and Transurethral resection of bladder (N/A, 10/07/2019).   Medications and Allergies  Current Medications  Current Outpatient Medications  Medication Sig Dispense Refill  . acetaminophen (TYLENOL) 500 MG tablet Take 500 mg by mouth every 6 (six) hours as needed for Pain  . alfuzosin (UROXATRAL) 10 mg ER tablet Take by mouth every morning Take one tab daily  . apixaban (ELIQUIS) 5 mg tablet Take 1 tablet (5 mg total) by mouth every 12 (twelve) hours 180 tablet 3  . aspirin 81 MG EC tablet Take 81 mg by mouth once daily  . atorvastatin (LIPITOR) 10 MG tablet Take 1 tablet (10 mg total) by mouth once daily For cholesterol 14 tablet 0  . calcium carbonate 500 mg calcium (1,250 mg) chewable tablet Take 500 mg of elemental by mouth once daily.  . cholecalciferol (VITAMIN D3) 5,000 unit capsule Take 5,000 Units by mouth once daily  . ferrous fumarate 325 mg (106 mg iron) Tab Take by mouth  . finasteride (PROSCAR) 5 mg tablet TAKE 1 TABLET BY MOUTH ONCE DAILY FOR PROSTATE 90 tablet 0  . metoprolol succinate (TOPROL-XL) 25 MG XL tablet Take 1 tablet (25 mg total) by  mouth every morning 90 tablet 3  . metoprolol succinate (TOPROL-XL) 50 MG XL tablet Take 1 tablet (50 mg total)  by mouth every morning 90 tablet 3  . multivitamin tablet Take 1 tablet by mouth once daily.  Marland Kitchen omega-3 fatty acids/fish oil 340-1,000 mg capsule Take 1 capsule by mouth once daily.   Marland Kitchen sulfamethoxazole-trimethoprim (BACTRIM DS) 800-160 mg tablet  . VIT A/VIT C/VIT E/ZINC/COPPER (PRESERVISION AREDS ORAL) Take 1 capsule by mouth 2 (two) times daily   No current facility-administered medications for this visit.   Allergies: Atenolol  Social and Family History  Social History reports that he has never smoked. He has never used smokeless tobacco. He reports current alcohol use of about 4.2 - 5.8 standard drinks of alcohol per week.  Family History Family History  Problem Relation Age of Onset  . Diabetes type II Mother  . Lung cancer Father  . Cancer Paternal Uncle   Review of Systems  Review of Systems  Constitutional: Negative for chills, diaphoresis, fever, malaise/fatigue and weight loss.  HENT: Negative for congestion, ear discharge, hearing loss and tinnitus.  Eyes: Negative for blurred vision.  Respiratory: Negative for cough, hemoptysis, sputum production, shortness of breath and wheezing.  Cardiovascular: Negative for chest pain, palpitations, orthopnea, claudication, leg swelling and PND.  Gastrointestinal: Negative for abdominal pain, blood in stool, constipation, diarrhea, heartburn, melena, nausea and vomiting.  Genitourinary: Negative for frequency and hematuria.  Musculoskeletal: Negative for back pain, falls, joint pain and myalgias.  Skin: Negative for itching and rash.  Neurological: Negative for tingling, focal weakness, loss of consciousness, weakness and headaches.  Endo/Heme/Allergies: Negative for polydipsia. Does not bruise/bleed easily.  Psychiatric/Behavioral: Negative for depression, memory loss and substance abuse. The patient is not nervous/anxious.    Physical Examination   Vitals: BP 142/60  Pulse 67  Ht 180.3 cm (5\' 11" )  Wt 88.5 kg (195 lb)   SpO2 98%  BMI 27.20 kg/m  Ht:180.3 cm (5\' 11" ) Wt:88.5 kg (195 lb) XQJ:JHER surface area is 2.11 meters squared. Body mass index is 27.2 kg/m.  Wt Readings from Last 3 Encounters:  02/11/20 88.5 kg (195 lb)  02/04/20 89.4 kg (197 lb 1.5 oz)  01/28/20 89.4 kg (197 lb 1.5 oz)   BP Readings from Last 3 Encounters:  02/11/20 142/60  02/04/20 101/48  01/28/20 133/65   General appearance appears in no acute distress  Head Mouth and Eye exam Normocephalic, without obvious abnormality, atraumatic Dentition is good Eyes appear anicteric   LUNGS Breath Sounds: Normal Percussion: Normal  CARDIOVASCULAR JVP CV wave: no HJR: no Elevation at 90 degrees: None Carotid Pulse: normal pulsation bilaterally Bruit: None Apex: apical impulse normal  Auscultation Rhythm: normal sinus rhythm and normal pacemaker rhythm S1: normal S2: normal Clicks: no Rub: no Murmurs: no murmurs  Gallop: None ABDOMEN Liver enlargement: no Pulsatile aorta: no Ascites: no Bruits: no  EXTREMITIES Clubbing: no Edema: trace to 1+ bilateral pedal edema Pulses: peripheral pulses symmetrical Femoral Bruits: no Amputation: no SKIN Rash: no Cyanosis: no Embolic phemonenon: no Bruising: no NEURO Alert and Oriented to person, place and time: yes Non focal: yes  PSYCH: Pt appears to have normal affect  LABS REVIEWED Last 3 CBC results: Lab Results  Component Value Date  WBC 6.5 12/11/2019  WBC 5.1 11/17/2019  WBC 6.1 05/19/2019   Lab Results  Component Value Date  HGB 8.5 (L) 12/11/2019  HGB 9.4 (L) 11/17/2019  HGB 14.4 05/19/2019   Lab Results  Component Value Date  HCT 28.5 (L) 12/11/2019  HCT 29.9 (L) 11/17/2019  HCT 43.6 05/19/2019   Lab Results  Component Value Date  PLT 289 12/11/2019  PLT 248 11/17/2019  PLT 200 05/19/2019   Lab Results  Component Value Date  CREATININE 0.9 11/03/2019  BUN 15 11/03/2019  NA 139 11/03/2019  K 4.7 11/03/2019  CL 104 11/03/2019   CO2 30.8 11/03/2019   Lab Results  Component Value Date  HGBA1C 5.8 (H) 11/17/2019   Lab Results  Component Value Date  HDL 58.6 11/17/2019  HDL 49.4 11/14/2018  HDL 60.0 11/13/2017   Lab Results  Component Value Date  LDLCALC 49 11/17/2019  LDLCALC 51 11/14/2018  LDLCALC 56 11/13/2017   Lab Results  Component Value Date  TRIG 44 11/17/2019  TRIG 55 11/14/2018  TRIG 51 11/13/2017   Lab Results  Component Value Date  ALT 11 11/17/2019  AST 14 11/17/2019  ALKPHOS 64 11/17/2019   Assessment and Plan   86 y.o. male with  ICD-10-CM ICD-9-CM  1. Essential hypertension-blood pressure is improved. Will continue with current regimen and follow his blood pressure for symptoms. He has less dizziness. Hydration daily was discussed and recommended. I10 401.9  2. Hypercholesteremia E78.0 272.0  3. Bradycardia-status post permanent pacemaker. Patient shows improved with metoprolol succinate at 75 mg daily. We will continue with this for now and follow his symptoms. He has no complaints at present. Pacemaker is functioning normally. R00.1 427.89  4. Symptomatic sinus bradycardia-pacemaker appears to be functioning normally. R00.1 427.89  5. Dizziness-improved. We will continue with current regimen. R42 780.4   6. Atrial fib- Is anticoagulated with Eliquis. Will continue with this regimen as it appears to be well tolerated. Interrogation of pacemaker reveals no episodes of A. fib.  7. Shortness of breath-we will proceed with PFTs to evaluate for possible pulmonary etiology. Further recommendations after this is complete.  Return in about 6 months (around 08/13/2020).  These notes generated with voice recognition software. I apologize for typographical errors.  Sydnee Levans, MD    Electronically signed by Sydnee Levans, MD at 02/17/2020 1:43 PM EDT  Plan of Treatment - documented as of this encounter Upcoming Encounters Upcoming Encounters  Date Type Specialty Care  Team Description  03/24/2020 Office Visit Physical Medicine and Rehabilitation G[place]a Hake, [place], [place] [address]  [phone]  [phone] ([phone]    05/20/2020 Office Visit Family Medicine [place], Daylene Posey, MD  [facility] [place], [place] [address]  [phone]  [phone] (Fax)    06/15/2020 Office Visit Neurology [place], [place]  [facility]  [place], [place] [address]  [phone]  [phone] ([address])    [place], [place], [place] [address]  [phone]  [phone] (Fax)    Goals - documented as of this encounter Goal Patient Goal Type Associated Problems Recent Progress Patient-Stated? Author  Lose Weight  Weight  On track (11/14/2018 10:38 AM EDT) Yes Vivi Ferns, CMA  Note:   Formatting of this note might be different from the original. Would like to lose a few pounds    Visit Diagnoses - documented in this encounter Diagnosis  Essential hypertension - Primary   Persistent atrial fibrillation (CMS-HCC)  Atrial fibrillation   Symptomatic sinus bradycardia   Syncope and collapse   Urothelial carcinoma of bladder (CMS-HCC)   Images Patient Demographics  Patient Address Communication Language Race /  Ethnicity Marital Status  [place], [place] [address] [phone] [facility]) [phone] (Home) [email or web address] English (Preferred) White / Not Hispanic or Latino Widowed  Patient Contacts  Contact Name Contact Address Communication Relationship to Patient  Burna Sis [place], [place] [address] [phone] [facility]) Son or Daughter, Emergency Contact  Document Information  Primary Care Provider Other Service Providers Document Coverage Dates  Mancel Bale, MD (Mar. 19, 2015March 19, 2015 - Present) [phone] (Work) [phone]  (Fax) [place] [facility] [place], [place] [address] Internal Medicine [facility] [place], [place] [address] Sydnee Levans, MD (Consulting Provider) [phone] (Work) [phone] (Fax) [place], [place] [address] Cardiovascular Disease [employer] [address] [place], [place] [address] Zeb Comfort, [place] (Physician Assistant) [phone] (Work) [phone] (Fax) [address] [place] [place], [place] [address] Cardiovascular Disease [place] [address] [place], [place] [address] Rosalia Hammers, DO (Consulting Provider) [phone] (Work) [phone] (Fax) [place], [place] [address] Orthopedic Surgery [facility] [place], [place] [address] Abbie Sons, MD (Consulting Provider) [phone] (Fax) [address] [place], [place] [address] Urology   Tim Lair, MD (Consulting Provider) [phone] (Work) [phone] (Fax) [place], [place] [address] Ophthalmology   Juliann Mule, DDS (Consulting Provider) [phone] (Work) [phone] (Fax) [address] [place], [place] [address] Dental General Practice   Cammie Sickle, MD (Consulting Provider) [phone] (Work) [phone] (Fax) [place], [place] [address] Hematology and Oncology [facility] [address] [place], [place] [address] Carlynn Purl, MD (Consulting Provider) [phone] (Work) [phone] (Fax) [place], [place] [address] Dermatology   Autumn Ashby [place], [place] (Physician Assistant) [phone] (Work) [phone] (Fax) [place], [place] [address] Neurology [facility] [place], [place] [address] Aug. 04, 2021August 04, 2021   [place] [address] [place], [place] [address]   Encounter Providers Encounter Date  Sydnee Levans, MD (Attending) [phone] (Work) [phone] (Fax) [address] [place],  [address] Cardiovascular Disease Aug. 04, 2021August 04, 2021    Show All Sections

## 2020-04-02 NOTE — Patient Instructions (Addendum)
Your procedure is scheduled on: 04-13-20 TUESDAY Report to Day Surgery on the 2nd floor of the [facility]. To find out your arrival time, please call [phone] between 1PM - 3PM on: 04-12-20 MONDAY  REMEMBER: Instructions that are not followed completely may result in serious medical risk, up to and including death; or upon the discretion of your surgeon and anesthesiologist your surgery may need to be rescheduled.  Do not eat food after midnight the night before surgery.  No gum chewing, lozengers or hard candies.  You may however, drink CLEAR liquids up to 2 hours before you are scheduled to arrive for your surgery. Do not drink anything within 2 hours of your scheduled arrival time.  Clear liquids include: - water  - apple juice without pulp - gatorade (not RED) - black coffee or tea (Do NOT add milk or creamers to the coffee or tea) Do NOT drink anything that is not on this list.  TAKE THESE MEDICATIONS THE MORNING OF SURGERY WITH A SIP OF WATER: -PROSCAR (FINASTERIDE) -METOPROLOL (TOPROL) -ALFUZOSIN (UROXATROL) -LIPITOR (ATORVASTATIN)  Follow recommendations from Cardiologist, Pulmonologist or PCP regarding stopping Aspirin, Coumadin, Plavix, Eliquis, Pradaxa, or Pletal-FOLLOW DR STOIOFF'S INSTRUCTIONS ON WHEN TO STOP YOUR ELIQUIS AND ASPIRIN  One week prior to surgery: Stop Anti-inflammatories (NSAIDS) such as Advil, Aleve, Ibuprofen, Motrin, Naproxen, Naprosyn and Aspirin based products such as Excedrin, Goodys Powder, BC Powder.  Stop ANY OVER THE COUNTER supplements until after surgery. (You may continue taking Tylenol, Vitamin D, Vitamin B, and multivitamin.)-STOP YOUR FISH OIL AND PRESERVISION 7 DAYS PRIOR TO SURGERY-YOU MAY RESUME AFTER SURGERY  No Alcohol for 24 hours before or after surgery.  No Smoking including e-cigarettes for 24 hours prior to surgery.  No chewable tobacco products for at least 6 hours prior to surgery.  No nicotine patches on the day of  surgery.  Do not use any "recreational" drugs for at least a week prior to your surgery.  Please be advised that the combination of cocaine and anesthesia may have negative outcomes, up to and including death. If you test positive for cocaine, your surgery will be cancelled.  On the morning of surgery brush your teeth with toothpaste and water, you may rinse your mouth with mouthwash if you wish. Do not swallow any toothpaste or mouthwash.  Do not wear jewelry, make-up, hairpins, clips or nail polish.  Do not wear lotions, powders, or perfumes.   Do not shave 48 hours prior to surgery.   Contact lenses, hearing aids and dentures may not be worn into surgery.  Do not bring valuables to the hospital. [facility] is not responsible for any missing/lost belongings or valuables.   Notify your doctor if there is any change in your medical condition (cold, fever, infection).  Wear comfortable clothing (specific to your surgery type) to the hospital.  Plan for stool softeners for home use; pain medications have a tendency to cause constipation. You can also help prevent constipation by eating foods high in fiber such as fruits and vegetables and drinking plenty of fluids as your diet allows.  After surgery, you can help prevent lung complications by doing breathing exercises.  Take deep breaths and cough every 1-2 hours. Your doctor may order a device called an Incentive Spirometer to help you take deep breaths. When coughing or sneezing, hold a pillow firmly against your incision with both hands. This is called "splinting." Doing this helps protect your incision. It also decreases belly discomfort.  If you  are being admitted to the hospital overnight, leave your suitcase in the car. After surgery it may be brought to your room.  If you are being discharged the day of surgery, you will not be allowed to drive home. You will need a responsible adult (18 years or older) to drive you home and  stay with you that night.   If you are taking public transportation, you will need to have a responsible adult (18 years or older) with you. Please confirm with your physician that it is acceptable to use public transportation.   Please call the [facility] Dept. at [phone] if you have any questions about these instructions.  Visitation Policy:  Patients undergoing a surgery or procedure may have one family member or support person with them as long as that person is not COVID-19 positive or experiencing its symptoms.  That person may remain in the waiting area during the procedure.  Inpatient Visitation Update:   In an effort to ensure the safety of our team members and our patients, we are implementing a change to our visitation policy:  Effective Monday, Aug. 9, at 7 a.m., inpatients will be allowed one support person.  o The support person may change daily.  o The support person must pass our screening, gel in and out, and wear a mask at all times, including in the patient's room.  o Patients must also wear a mask when staff or their support person are in the room.  o Masking is required regardless of vaccination status.  Systemwide, no visitors 17 or younger.

## 2020-04-02 NOTE — Pre-Procedure Instructions (Signed)
PT STATED DURING PREOP PHONE CALL THAT HE DID NOT HAVE HIS PAPERWORK IN FRONT OF HIM BUT THAT HE HAD BEEN INSTRUCTED BY DR STOIOFFS OFFICE ABOUT WHEN TO STOP HIS ASA AND ELIQUIS. I INSTRUCTED PT TO FOLLOW INSTRUCTIONS GIVEN BY SURGEONS OFFICE. PT VERBALIZED UNDERSTANDING

## 2020-04-05 NOTE — Progress Notes (Signed)
[facility] Perioperative Services  Pre-Admission/Anesthesia Testing Clinical Review  Date: 04/05/20  Patient Demographics:  Name: Christopher [name] DOB:   1927/11/14 MRN:   [id number]  Planned Surgical Procedure(s):    Case: [id number] Date/Time: 04/13/20 0957   Procedure: TRANSURETHRAL RESECTION OF BLADDER TUMOR (TURBT) (N/A )   Anesthesia type: Choice   Pre-op diagnosis: bladder tumor   Location: [facility] OR ROOM 10 / [place] ORS FOR ANESTHESIA GROUP   Surgeons: Abbie Sons, MD     NOTE: Available PAT nursing documentation and vital signs have been reviewed. Clinical nursing staff has updated patient's PMH/PSHx, current medication list, and drug allergies/intolerances to ensure comprehensive history available to assist in medical decision making as it pertains to the aforementioned surgical procedure and anticipated anesthetic course.   Clinical Discussion:  Christopher [name] is a 86 y.o. male who is submitted for pre-surgical anesthesia review and clearance prior to him undergoing the above procedure. Patient has never been a smoker. Pertinent PMH includes: A. fib, symptomatic bradycardia (s/p PPM placement), HTN, HLD, urothelial carcinoma, IDA, Gilbert's disease, lumbar DDD.  Patient is followed by cardiology Ubaldo Glassing, MD). He was last seen in the cardiology clinic on 02/11/2020; notes reviewed.  At the time of his clinic visit, patient was doing fairly well from cardiac standpoint.  History of high-grade heart block status post dual-chamber PPM placement patient denied any chest pain, however endorsed chronic shortness breath with occasional wheezing.  Patient recently sent for pulmonary function testing to further assess his shortness of breath.  PFTs performed on 11/19/2019 revealed borderline normal lung volumes with a TLC of 72% with preserved spirometry and DLCO (see full results below).  Exam revealed no peripheral edema. Patient denies PND, orthopnea, palpitation,  or presyncope/syncope.  Hypertension controlled with beta-blocker monotherapy.  Patient is on statin for his HLD. CHA2DS2-VASc Score = 3. Patient chronically anticoagulated using apixaban and daily low-dose ASA therapy.  Myocardial perfusion imaging performed in 02/2019 revealed an LVEF of 51% with no RWMAs. TTE also done in 02/2019 revealed normal LV systolic function with mild LVH; LVEF 55% (see full interpretation of cardiovascular testing below).    Patient is scheduled to undergo TURBT on 04/13/2020 with Dr. John Giovanni (urology).  Given patient's age and past medical history, presurgical cardiac clearance was sought by the attending surgeon's office. Per cardiology, "patient is optimized for surgery and may proceed with planned urological procedure with a LOW risk stratification".  He had this patient is on chronic anticoagulation therapy.  He has been advised of recommendations from cardiology to hold his daily low-dose ASA for 7 days and his apixaban for 3 days prior to his procedure.  He denies previous perioperative complications with anesthesia. He underwent a general anesthetic course here (ASA III) in 01/2020 with no documented complications.   Vitals with BMI 03/26/2020 03/19/2020 03/19/2020  Height 5\' 11"  - 5\' 11"   Weight 195 lbs - 198 lbs  BMI 28.41 - 32.44  Systolic 010 272 536  Diastolic 63 79 82  Pulse 74 60 69    Providers/Specialists:   NOTE: Primary physician provider listed below. Patient may have been seen by APP or partner within same practice.   PROVIDER ROLE LAST Claud Kelp, MD Urology (Surgeon) 03/26/2020  Ezequiel Kayser, MD Primary Care Provider 11/17/2019  Bartholome Bill, MD Cardiology 02/11/2020  Charlaine Dalton, MD Oncology 03/19/2020   Allergies:  Atenolol  Current Home Medications:   No current facility-administered medications for this encounter.   Marland Kitchen  acetaminophen (TYLENOL) 500 MG tablet  . alfuzosin (UROXATRAL) 10 MG 24 hr tablet  .  apixaban (ELIQUIS) 5 MG TABS tablet  . aspirin EC 81 MG tablet  . atorvastatin (LIPITOR) 10 MG tablet  . calcium carbonate (OSCAL) 1500 (600 Ca) MG TABS tablet  . Cholecalciferol (VITAMIN D3) 50 MCG (2000 UT) TABS  . ferrous sulfate 325 (65 FE) MG tablet  . finasteride (PROSCAR) 5 MG tablet  . metoprolol succinate (TOPROL-XL) 25 MG 24 hr tablet  . metoprolol succinate (TOPROL-XL) 50 MG 24 hr tablet  . Multiple Vitamin (MULTIVITAMIN WITH MINERALS) TABS tablet  . Multiple Vitamins-Minerals (PRESERVISION AREDS 2 PO)  . Omega-3 Fatty Acids (FISH OIL) 1200 MG CAPS   History:   Past Medical History:  Diagnosis Date  . BPH (benign prostatic hyperplasia)   . Cancer ([place])    skin cancer on face carcinoma  . ED (erectile dysfunction)   . Gilbert disease   . HLD (hyperlipidemia)   . Hypertension   . Macular degeneration   . Osteopenia   . Presence of permanent cardiac pacemaker   . Sensorineural hearing loss    Past Surgical History:  Procedure Laterality Date  . CATARACT EXTRACTION Bilateral   . CYSTOSCOPY WITH BIOPSY N/A 01/20/2020   Procedure: CYSTOSCOPY WITH BIOPSY;  Surgeon: Abbie Sons, MD;  Location: [facility] ORS;  Service: Urology;  Laterality: N/A;  . CYSTOSCOPY WITH FULGERATION N/A 01/20/2020   Procedure: CYSTOSCOPY WITH FULGERATION;  Surgeon: Abbie Sons, MD;  Location: [facility] ORS;  Service: Urology;  Laterality: N/A;  . HEMORRHOIDECTOMY WITH HEMORRHOID BANDING    . KNEE ARTHROSCOPY Left   . PACEMAKER INSERTION N/A 03/03/2015   Procedure: INSERTION DUAL LEAD PACEMAKER;  Surgeon: Isaias Cowman, MD;  Location: [facility] ORS;  Service: Cardiovascular;  Laterality: N/A;  . TONSILLECTOMY    . TRANSURETHRAL RESECTION OF BLADDER TUMOR N/A 10/07/2019   Procedure: TRANSURETHRAL RESECTION OF BLADDER TUMOR (TURBT);  Surgeon: Abbie Sons, MD;  Location: [facility] ORS;  Service: Urology;  Laterality: N/A;   Family History  Problem Relation Age of Onset  . Diabetes Mother   . Lung  cancer Father   . Cancer Paternal Uncle   . Prostate cancer Neg Hx   . Bladder Cancer Neg Hx   . Kidney cancer Neg Hx    Social History   Tobacco Use  . Smoking status: Never Smoker  . Smokeless tobacco: Never Used  Vaping Use  . Vaping Use: Never used  Substance Use Topics  . Alcohol use: Yes    Alcohol/week: 7.0 standard drinks    Types: 7 Glasses of wine per week  . Drug use: No    Pertinent Clinical Results:  LABS: Labs reviewed: Acceptable for surgery.     ECG: Date: 10/07/2019 Time ECG obtained: 1318 PM Rate: 61 bpm Rhythm: Atrial paced rhythm Axis (leads I and aVF): Normal Intervals: PR 232 ms. QRS 76 ms. QTc 420 ms. ST segment and T wave changes: No evidence of acute ST segment elevation or depression Comparison: Previous tracing on 05/18/2018 showed a.flutter with variable AV block   IMAGING / PROCEDURES: PULMONARY FUNCTION TESTING done on 11/19/2019 1. Borderline normal lung volumes with TLC at 72%, however preserved spirometry and DLCO. These findings may be seen in early interstitial lung disease.  2. SPIROMETRY:  FVC was 2.71 liters, 86% of predicted  FEV1 was 2.03, 85% of predicted  FEV1 ratio was 75  FEF 25-75% liters per second was 68% of predicted 3.  LUNG VOLUMES:  TLC was 72% of predicted  RV was 52% of predicted 4. DIFFUSION CAPACITY:  DLCO was 91% of predicted  DLCO/VA was 113% of predicted 5. FLOW VOLUME LOOP:  Restricted shape of FVL   LEXISCAN done on 03/04/2019 1. LVEF 51% 2. LV function good. 3. Regional wall motion reveals normal myocardial thickening and wall motion 4. No artifact noted 5. Left ventricular cavity normal 6. No evidence of reversible ischemia 7. Low risk study  ECHOCARDIOGRAM done on 03/03/2019 1. LVEF 55% 2. Normal LV systolic function with mild LVH 3. Normal RV systolic function 4. Mild AR and TR; trivial MR 5. No valvular stenosis  Impression and Plan:  [name] has been referred for  pre-anesthesia review and clearance prior to him undergoing the planned anesthetic and procedural courses. Available labs, pertinent testing, and imaging results were personally reviewed by me. This patient has been appropriately cleared by cardiology.  Perioperative prescription for implanted cardiac device form completed by cardiology and placed on patient's OR chart for review by surgical team.   Based on clinical review performed today (04/05/20), barring any significant acute changes in the patient's overall condition, it is anticipated that he will be able to proceed with the planned surgical intervention. Any acute changes in clinical condition may necessitate his procedure being postponed and/or cancelled. Pre-surgical instructions were reviewed with the patient during his PAT appointment and questions were fielded by PAT clinical staff.  Honor Loh, MSN, APRN, FNP-C, CEN [facility]  Peri-operative Services Nurse Practitioner Phone: [phone] 04/05/20 4:14 PM  NOTE: This note has been prepared using Dragon dictation software. Despite my best ability to proofread, there is always the potential that unintentional transcriptional errors may still occur from this process.

## 2020-04-06 LAB — CULTURE, URINE COMPREHENSIVE

## 2020-04-09 ENCOUNTER — Ambulatory Visit: Payer: Self-pay | Admitting: Physician Assistant

## 2020-04-09 ENCOUNTER — Other Ambulatory Visit: Payer: Self-pay

## 2020-04-09 ENCOUNTER — Other Ambulatory Visit
Admission: RE | Admit: 2020-04-09 | Discharge: 2020-04-09 | Disposition: A | Discharge status: Home / Self Care | Payer: Medicare HMO | Source: Ambulatory Visit | Attending: Urology | Admitting: Urology

## 2020-04-09 DIAGNOSIS — Z01812 Encounter for preprocedural laboratory examination: Secondary | ICD-10-CM | POA: Insufficient documentation

## 2020-04-09 DIAGNOSIS — Z20822 Contact with and (suspected) exposure to covid-19: Secondary | ICD-10-CM | POA: Insufficient documentation

## 2020-04-10 LAB — SARS CORONAVIRUS 2 (TAT 6-24 HRS): SARS Coronavirus 2: NEGATIVE

## 2020-04-13 ENCOUNTER — Ambulatory Visit: Payer: Medicare HMO | Admitting: Urgent Care

## 2020-04-13 ENCOUNTER — Other Ambulatory Visit: Payer: Self-pay

## 2020-04-13 ENCOUNTER — Ambulatory Visit
Admission: RE | Admit: 2020-04-13 | Discharge: 2020-04-13 | Disposition: A | Discharge status: Home / Self Care | Payer: Medicare HMO | Attending: Urology | Admitting: Urology

## 2020-04-13 ENCOUNTER — Encounter
Admission: RE | Disposition: A | Discharge status: Home / Self Care | Payer: Self-pay | Source: Home / Self Care | Attending: Urology

## 2020-04-13 ENCOUNTER — Encounter: Payer: Self-pay | Admitting: Urology

## 2020-04-13 ENCOUNTER — Telehealth: Payer: Self-pay | Admitting: *Deleted

## 2020-04-13 DIAGNOSIS — Z950 Presence of cardiac pacemaker: Secondary | ICD-10-CM | POA: Insufficient documentation

## 2020-04-13 DIAGNOSIS — Z801 Family history of malignant neoplasm of trachea, bronchus and lung: Secondary | ICD-10-CM | POA: Diagnosis not present

## 2020-04-13 DIAGNOSIS — H905 Unspecified sensorineural hearing loss: Secondary | ICD-10-CM | POA: Diagnosis not present

## 2020-04-13 DIAGNOSIS — Z7982 Long term (current) use of aspirin: Secondary | ICD-10-CM | POA: Diagnosis not present

## 2020-04-13 DIAGNOSIS — D494 Neoplasm of unspecified behavior of bladder: Secondary | ICD-10-CM

## 2020-04-13 DIAGNOSIS — Z7901 Long term (current) use of anticoagulants: Secondary | ICD-10-CM | POA: Diagnosis not present

## 2020-04-13 DIAGNOSIS — E785 Hyperlipidemia, unspecified: Secondary | ICD-10-CM | POA: Insufficient documentation

## 2020-04-13 DIAGNOSIS — Z85828 Personal history of other malignant neoplasm of skin: Secondary | ICD-10-CM | POA: Insufficient documentation

## 2020-04-13 DIAGNOSIS — C679 Malignant neoplasm of bladder, unspecified: Secondary | ICD-10-CM | POA: Insufficient documentation

## 2020-04-13 DIAGNOSIS — I10 Essential (primary) hypertension: Secondary | ICD-10-CM | POA: Insufficient documentation

## 2020-04-13 DIAGNOSIS — Z888 Allergy status to other drugs, medicaments and biological substances status: Secondary | ICD-10-CM | POA: Insufficient documentation

## 2020-04-13 DIAGNOSIS — N400 Enlarged prostate without lower urinary tract symptoms: Secondary | ICD-10-CM | POA: Diagnosis not present

## 2020-04-13 DIAGNOSIS — Z79899 Other long term (current) drug therapy: Secondary | ICD-10-CM | POA: Diagnosis not present

## 2020-04-13 DIAGNOSIS — M8580 Other specified disorders of bone density and structure, unspecified site: Secondary | ICD-10-CM | POA: Diagnosis not present

## 2020-04-13 DIAGNOSIS — Z800 Family history of malignant neoplasm of digestive organs: Secondary | ICD-10-CM | POA: Insufficient documentation

## 2020-04-13 DIAGNOSIS — R310 Gross hematuria: Secondary | ICD-10-CM

## 2020-04-13 DIAGNOSIS — R338 Other retention of urine: Secondary | ICD-10-CM

## 2020-04-13 HISTORY — PX: TRANSURETHRAL RESECTION OF BLADDER TUMOR: SHX2575

## 2020-04-13 SURGERY — TURBT (TRANSURETHRAL RESECTION OF BLADDER TUMOR)
Anesthesia: General

## 2020-04-13 MED ORDER — FENTANYL CITRATE (PF) 100 MCG/2ML IJ SOLN
INTRAMUSCULAR | Status: DC | PRN
Start: 2020-04-13 — End: 2020-04-13
  Administered 2020-04-13: 25 ug via INTRAVENOUS
  Administered 2020-04-13: 50 ug via INTRAVENOUS
  Administered 2020-04-13: 25 ug via INTRAVENOUS

## 2020-04-13 MED ORDER — FENTANYL CITRATE (PF) 100 MCG/2ML IJ SOLN: 25.0000 ug | INTRAMUSCULAR | Status: DC | PRN

## 2020-04-13 MED ORDER — PHENYLEPHRINE HCL (PRESSORS) 10 MG/ML IV SOLN
INTRAVENOUS | Status: DC | PRN
  Administered 2020-04-13: 100 ug via INTRAVENOUS

## 2020-04-13 MED ORDER — CEFAZOLIN SODIUM-DEXTROSE 2-4 GM/100ML-% IV SOLN
INTRAVENOUS | Status: AC
  Filled 2020-04-13: qty 100

## 2020-04-13 MED ORDER — CEFAZOLIN SODIUM-DEXTROSE 2-4 GM/100ML-% IV SOLN
2.0000 g | INTRAVENOUS | Status: AC
  Administered 2020-04-13: 2 g via INTRAVENOUS

## 2020-04-13 MED ORDER — PROPOFOL 10 MG/ML IV BOLUS
INTRAVENOUS | Status: DC | PRN
  Administered 2020-04-13: 100 mg via INTRAVENOUS

## 2020-04-13 MED ORDER — FAMOTIDINE 20 MG PO TABS
ORAL_TABLET | ORAL | Status: AC
  Administered 2020-04-13: 20 mg via ORAL
  Filled 2020-04-13: qty 1

## 2020-04-13 MED ORDER — ONDANSETRON HCL 4 MG/2ML IJ SOLN
INTRAMUSCULAR | Status: DC | PRN
  Administered 2020-04-13: 4 mg via INTRAVENOUS

## 2020-04-13 MED ORDER — EPHEDRINE SULFATE 50 MG/ML IJ SOLN
INTRAMUSCULAR | Status: DC | PRN
  Administered 2020-04-13 (×5): 10 mg via INTRAVENOUS

## 2020-04-13 MED ORDER — CHLORHEXIDINE GLUCONATE 0.12 % MT SOLN
OROMUCOSAL | Status: AC
  Administered 2020-04-13: 15 mL via OROMUCOSAL
  Filled 2020-04-13: qty 15

## 2020-04-13 MED ORDER — LIDOCAINE HCL (CARDIAC) PF 100 MG/5ML IV SOSY
PREFILLED_SYRINGE | INTRAVENOUS | Status: DC | PRN
  Administered 2020-04-13: 40 mg via INTRAVENOUS

## 2020-04-13 MED ORDER — PROPOFOL 500 MG/50ML IV EMUL
INTRAVENOUS | Status: AC
  Filled 2020-04-13: qty 50

## 2020-04-13 MED ORDER — FENTANYL CITRATE (PF) 100 MCG/2ML IJ SOLN
INTRAMUSCULAR | Status: AC
  Filled 2020-04-13: qty 2

## 2020-04-13 MED ORDER — FAMOTIDINE 20 MG PO TABS: 20.0000 mg | ORAL_TABLET | Freq: Once | ORAL | Status: AC

## 2020-04-13 MED ORDER — TRAMADOL HCL 50 MG PO TABS: 50.0000 mg | ORAL_TABLET | Freq: Two times a day (BID) | ORAL | 0 refills | Status: DC | PRN

## 2020-04-13 MED ORDER — LACTATED RINGERS IV SOLN: INTRAVENOUS | Status: DC

## 2020-04-13 MED ORDER — CHLORHEXIDINE GLUCONATE 0.12 % MT SOLN: 15.0000 mL | Freq: Once | OROMUCOSAL | Status: AC

## 2020-04-13 MED ORDER — ONDANSETRON HCL 4 MG/2ML IJ SOLN: 4.0000 mg | Freq: Once | INTRAMUSCULAR | Status: DC | PRN

## 2020-04-13 MED ORDER — ORAL CARE MOUTH RINSE: 15.0000 mL | Freq: Once | OROMUCOSAL | Status: AC

## 2020-04-13 SURGICAL SUPPLY — 30 items
BAG DRAIN CYSTO-URO LG1000N (MISCELLANEOUS) ×3 IMPLANT
BAG DRN RND TRDRP ANRFLXCHMBR (UROLOGICAL SUPPLIES) ×1
BAG URINE DRAIN 2000ML AR STRL (UROLOGICAL SUPPLIES) ×3 IMPLANT
CATH FOL 2WAY LX 20X30 (CATHETERS) ×3 IMPLANT
CATH FOLEY 2WAY 18X30 (CATHETERS) IMPLANT
CATH FOLEY 2WAY SIL 18X30 (CATHETERS)
DRAPE UTILITY 15X26 TOWEL STRL (DRAPES) ×3 IMPLANT
DRSG TELFA 3X8 NADH (GAUZE/BANDAGES/DRESSINGS) ×3 IMPLANT
DRSG TELFA 4X3 1S NADH ST (GAUZE/BANDAGES/DRESSINGS) ×3 IMPLANT
ELECT BIVAP BIPO 22/24 DONUT (ELECTROSURGICAL) ×3
ELECT LOOP 22F BIPOLAR SML (ELECTROSURGICAL)
ELECT REM PT RETURN 9FT ADLT (ELECTROSURGICAL)
ELECTRD BIVAP BIPO 22/24 DONUT (ELECTROSURGICAL) ×1 IMPLANT
ELECTRODE LOOP 22F BIPOLAR SML (ELECTROSURGICAL) IMPLANT
ELECTRODE REM PT RTRN 9FT ADLT (ELECTROSURGICAL) IMPLANT
GLOVE BIOGEL PI IND STRL 7.5 (GLOVE) ×1 IMPLANT
GLOVE BIOGEL PI INDICATOR 7.5 (GLOVE) ×2
GOWN STRL REUS W/ TWL LRG LVL3 (GOWN DISPOSABLE) ×1 IMPLANT
GOWN STRL REUS W/TWL LRG LVL3 (GOWN DISPOSABLE) ×3
GOWN STRL REUS W/TWL XL LVL4 (GOWN DISPOSABLE) ×3 IMPLANT
IV NS IRRIG 3000ML ARTHROMATIC (IV SOLUTION) ×12 IMPLANT
KIT TURNOVER CYSTO (KITS) ×3 IMPLANT
LOOP CUT BIPOLAR 24F LRG (ELECTROSURGICAL) ×3 IMPLANT
PACK CYSTO AR (MISCELLANEOUS) ×3 IMPLANT
SET IRRIG Y TYPE TUR BLADDER L (SET/KITS/TRAYS/PACK) ×3 IMPLANT
SURGILUBE 2OZ TUBE FLIPTOP (MISCELLANEOUS) ×3 IMPLANT
SYR 10ML 18GX1 1/2 (NEEDLE) ×3 IMPLANT
SYR TOOMEY IRRIG 70ML (MISCELLANEOUS) ×3
SYRINGE TOOMEY IRRIG 70ML (MISCELLANEOUS) ×1 IMPLANT
WATER STERILE IRR 1000ML POUR (IV SOLUTION) ×3 IMPLANT

## 2020-04-13 NOTE — Interval H&P Note (Signed)
History and Physical Interval Note:  04/13/2020 9:54 AM  [name]  has presented today for surgery, with the diagnosis of bladder tumor.  The various methods of treatment have been discussed with the patient and family. After consideration of risks, benefits and other options for treatment, the patient has consented to  Procedure(s): TRANSURETHRAL RESECTION OF BLADDER TUMOR (TURBT) (N/A) as a surgical intervention.  The patient's history has been reviewed, patient examined, no change in status, stable for surgery.  I have reviewed the patient's chart and labs.  Questions were answered to the patient's satisfaction.     [place]

## 2020-04-13 NOTE — Discharge Instructions (Signed)
Transurethral Resection of Bladder Tumor (TURBT) or Bladder Biopsy   Definition:  Transurethral Resection of the Bladder Tumor is a surgical procedure used to diagnose and remove tumors within the bladder. T  General instructions:     Your recent bladder surgery requires very little post hospital care but some definite precautions.  Despite the fact that no skin incisions were used, the area around the bladder incisions are raw and covered with scabs to promote healing and prevent bleeding. Certain precautions are needed to insure that the scabs are not disturbed over the next 2-4 weeks while the healing proceeds.  Because the raw surface inside your bladder and the irritating effects of urine you may expect frequency of urination and/or urgency (a stronger desire to urinate) and perhaps even getting up at night more often. This will usually resolve or improve slowly over the healing period. You may see some blood in your urine over the first 6 weeks. Do not be alarmed, even if the urine was clear for a while. Get off your feet and drink lots of fluids until clearing occurs. If you start to pass clots or don't improve call us.  Diet:  You may return to your normal diet immediately. Because of the raw surface of your bladder, alcohol, spicy foods, foods high in acid and drinks with caffeine may cause irritation or frequency and should be used in moderation. To keep your urine flowing freely and avoid constipation, drink plenty of fluids during the day (8-10 glasses). Tip: Avoid cranberry juice because it is very acidic.  Activity:  Your physical activity doesn't need to be restricted. However, if you are very active, you may see some blood in the urine. We suggest that you reduce your activity under the circumstances until the bleeding has stopped.  Bowels:  It is important to keep your bowels regular during the postoperative period. Straining with bowel movements can cause bleeding. A bowel  movement every other day is reasonable. Use a mild laxative if needed, such as milk of magnesia 2-3 tablespoons, or 2 Dulcolax tablets. Call if you continue to have problems. If you had been taking narcotics for pain, before, during or after your surgery, you may be constipated. Take a laxative if necessary.    Medication:  You should resume your pre-surgery medications unless told not to. In addition you may be given an antibiotic to prevent or treat infection. Antibiotics are not always necessary. All medication should be taken as prescribed until the bottles are finished unless you are having an unusual reaction to one of the drugs.  May resume Eliquis in 24-48 hours if urine is clear   Foley Catheter Care and Patient Education  Perform catheter care every day.  You can do this while in the shower, but NOT while taking a tub bath.  You will need the following supplies: -mild soap, such as Dove -water -a clean washcloth (not one already used for bathing) or a 4x4 piece of gauze -1 Cath-Secure -night drainage bag -2 alcohol swabs  1. Was you hands thoroughly with soap and water 2. Using mild soap and water, clan your genital area a. Men should retract the foreskin, if needed, and clean the area, including the penis b. Women should separate the labia, and clean the area from front to back  3. Clean your urinary opening, which is where the catheter enters your body. 4. Clean the catheter from where it enters your body and then down, away from your body.  Hold the catheter at the point it enters your body so that you don't put tension on it. 5. Rinse the area well and dry it gently.  Changing the drainage bag You will change your drainage bag twice a day -in the morning after you shower, change he night bag to the leg bag -at night before you go to bed, change the leg bag to the night bag  1. Wash your hands thoroughly with soap and water 2. Empty the urine from the drainage bag  into the toilet before you change it 3. Pinch off the catheter with your fingers and disconnect the used bag 4. Wipe the end of the catheter using an alcohol pad 5. Wipe the connector on the bag using the second alcohol pad 6. Connect the clean bag to the catheter and release your finger pinch 7. Check all connections.  Straighten any kinks or twits in the tubing  Caring for the Leg bag -always wear the leg bag below your knee.  This will help it drain. -keep the leg bag secure with the velcro straps.  If the straps leave a mark on your leg they are to tight and should be loosened.  Leaving the straps too tight can decrease you circulation and lead to blood clots. -empty the leg bag through the spout at the bottom every 2-4 hours as needed.  Do not let the bag become completely full. -do not lie down for longer than 2 hours while you are wearing the leg bag.  Caring for the Night Bag -always keep the night bag below the level of your bladder . -to hang your night bag while you sleep, place a clean plastic bag inside of a wastebasket.  Hang the night bag on the inside of the wastebasket.  Cleaning the Drainage bag 1. Wash you hands thoroughly with soap and water. 2. Rinse the equipment with cool water.  Do not use hot water it can damage the plastic equipment. 3. Was the equipment with a mild liquid detergent (ivory) and rinse with cool water. 4. To decrease odor, fill the bag halfway with a mixture of 1 part vinegar and 3 parts water. Shake the bag and let it sit for 15 minutes. 5. Rinse the bag with cool water and hang it up to dry.  Preventing infection -keep the drainage bag below the level of your bladder and off the floor at all times. -keep the catheter secured to your thigh to prevent it from moving. -do not lie on or block the flow of urine in the tubing. -shower daily to keep the catheter clean. -clean your hands before and after touching the catheter or bag. -the spout of the  drainage bag should never touch the side of the toilet or any emptying container.  Special Points -You may see some blood or urine around where the catheter enters your body, especially when walking or having a bowel movement.  This is normal, as long as there is urine draining into the drainage bag.  If you experience significant leakage around  catheter tube where it enters your urethra possibly associated with lower abdominal cramping you could be having a bladder spasm.  Please notify your doctor and we can prescribe you a medication to improve your symptoms. -drink 1-2 glasses of liquids every 2 hours while you're awake.  Call your doctor immediately if -your catheter comes out, do not try to replace it yourself -you have temperature of 101F (38.8C) or higher -you have  decrease in the amount of urine you are making -you have foul-smelling urine -you have bright red blood or large blood clots in your urine -you have abdominal pain and no urine in your catheter bag    [facility] [address], [place], [place] [address] [phone]   AMBULATORY SURGERY  DISCHARGE INSTRUCTIONS   1) The drugs that you were given will stay in your system until tomorrow so for the next 24 hours you should not:  A) Drive an automobile B) Make any legal decisions C) Drink any alcoholic beverage   2) You may resume regular meals tomorrow.  Today it is better to start with liquids and gradually work up to solid foods.  You may eat anything you prefer, but it is better to start with liquids, then soup and crackers, and gradually work up to solid foods.   3) Please notify your doctor immediately if you have any unusual bleeding, trouble breathing, redness and pain at the surgery site, drainage, fever, or pain not relieved by medication.    4) Additional Instructions:        Please contact your physician with any problems or Same Day Surgery at  [phone], Monday through Friday 6 am to 4 pm, or [place] at [facility] number at [phone].

## 2020-04-13 NOTE — Anesthesia Postprocedure Evaluation (Signed)
Anesthesia Post Note  Patient: Christopher [name]  Procedure(s) Performed: TRANSURETHRAL RESECTION OF BLADDER TUMOR (TURBT) (N/A )  Patient location during evaluation: PACU Anesthesia Type: General Level of consciousness: awake and [name] Pain management: pain level controlled Vital Signs Assessment: post-procedure vital signs reviewed and stable Respiratory status: spontaneous breathing, nonlabored ventilation, respiratory function stable and patient connected to nasal cannula oxygen Cardiovascular status: blood pressure returned to baseline and stable Postop Assessment: no apparent nausea or vomiting Anesthetic complications: no   No complications documented.   Last Vitals:  Vitals:   04/13/20 1211 04/13/20 1255  BP:  (!) 144/73  Pulse: 63 (!) 58  Resp: 16 16  Temp: (!) 35.8 C   SpO2: 96% 100%    Last Pain:  Vitals:   04/13/20 1211  TempSrc: Temporal  PainSc: 0-No pain                 Martha Clan

## 2020-04-13 NOTE — Op Note (Signed)
Preoperative diagnosis: 1. Bladder tumors  Postoperative diagnosis:  1. Multiple bladder tumors (18 with largest measuring 3 cm)  Procedure:  1. Cystoscopy 2. Transurethral resection of bladder tumors (largest 3 cm)  Surgeon: Nicki Reaper C. Zabdiel Dripps, M.D.  Anesthesia: General  Complications: None  Intraoperative findings:  1. Urethra normal in caliber 2. Prostate with moderate lateral lobe enlargement 3. Papillary tumor bladder neck at 3-6 o'clock position measuring 3 cm 4. Four nodular tumors left lower posterior wall; largest 5 mm 5. Four nodular tumors left upper posterior wall largest 4 mm 6. Irregular sessile mucosa left lateral wall measuring 15 mm 7. Four nodular tumors right posterior wall largest 15 mm 8. 5 nodular tumors dome largest 15 mm 9. Trabeculated bladder with multiple diverticula free of tumor 10. Previous left posterior wall diverticulum with erythematous mucosa and no visible recurrent tumor   EBL: Minimal  Specimens: 1. Bladder tumors sent separately as above      Indication: Christopher [name] 86 y.o. male with a history of high-grade urothelial carcinoma status post TURBT 09/27/2019 and repeat biopsy 01/20/2020 which showed recurrent high-grade urothelial carcinoma.  He is on chronic anticoagulant therapy and due to intermittent gross hematuria was only able to receive 2 doses of BCG after his initial diagnosis.  Because of persistent low-grade gross hematuria he has not been able to receive BCG after his second biopsy and recent CT ordered by oncology showed small enhancing nodules in the bladder.  The site of prior resection was not enhancing.   After reviewing the management options for treatment, he elected to proceed with the above surgical procedure(s). We have discussed the potential benefits and risks of the procedure, side effects of the proposed treatment, the likelihood of the patient achieving the goals of the procedure, and any potential problems that  might occur during the procedure or recuperation. Informed consent has been obtained.  Description of procedure:  The patient was taken to the operating room and general anesthesia was induced.  The patient was placed in the dorsal lithotomy position, prepped and draped in the usual sterile fashion, and preoperative antibiotics were administered. A preoperative time-out was performed.   A 24 French continuous-flow resectoscope sheath with visual obturator was lubricated and passed per urethra and advanced into the bladder with findings as described above.  The visual obturator was removed and replaced with an [place].  The multiple bladder tumors were measured and site was noted.  The largest tumor at the bladder neck was resected down to superficial muscle with a bipolar loop.  Hemostasis was obtained with bipolar cautery.  All visible tumors were then resected in a similar fashion and removed after resection of each site and sent separately.  Hemostasis was obtained with bipolar cautery  Due to the location of the dome tumors only a superficial resection was performed.  The left bladder diverticulum was inspected and the mucosa in neck was cauterized.  The loop was replaced with a button electrode and all resection sites were cauterized.  No bleeding was noted at the end of the procedure and the resectoscope was removed.  An 18 French Foley catheter was placed with return of pink-tinged effluent upon irrigation.  After anesthetic reversal he was transported to the PACU in stable condition.  Plan: Foley catheter will remain indwelling x7 days.  He will be notified with the pathology report.   Christopher Marlette C. Bernardo Heater,  MD

## 2020-04-13 NOTE — Anesthesia Procedure Notes (Signed)
Procedure Name: LMA Insertion Date/Time: 04/13/2020 10:01 AM Performed by: Staci Acosta, CRNA Pre-anesthesia Checklist: Patient identified, Patient being monitored, Timeout performed, Emergency Drugs available and Suction available Patient Re-evaluated:Patient Re-evaluated prior to induction Oxygen Delivery Method: Circle system utilized Preoxygenation: Pre-oxygenation with 100% oxygen Induction Type: IV induction Ventilation: Mask ventilation without difficulty LMA: LMA inserted LMA Size: 4.0 Tube type: Oral Number of attempts: 1 Placement Confirmation: positive ETCO2 and breath sounds checked- equal and bilateral Tube secured with: Tape Dental Injury: Teeth and Oropharynx as per pre-operative assessment

## 2020-04-13 NOTE — Telephone Encounter (Signed)
Patient wife called in today and states he is leaking from his penis area. He is not having any pain . I talked with sam and she states he should wear a depends. Patient to call if he starts having any pain. Sam states the medication might make him dizziness .

## 2020-04-13 NOTE — Anesthesia Preprocedure Evaluation (Signed)
Anesthesia Evaluation  Patient identified by MRN, date of birth, ID band Patient awake    Reviewed: Allergy & Precautions, NPO status , Patient's Chart, lab work & pertinent test results  History of Anesthesia Complications Negative for: history of anesthetic complications  Airway Mallampati: III  TM Distance: >3 FB Neck ROM: Full    Dental no notable dental hx. (+) Teeth Intact, Dental Advisory Given   Pulmonary shortness of breath (secondary to anemia) and with exertion, neg sleep apnea, neg COPD, Patient abstained from smoking.Not current smoker,    Pulmonary exam normal breath sounds clear to auscultation       Cardiovascular Exercise Tolerance: Good METShypertension, Pt. on medications (-) angina(-) CAD and (-) Past MI + dysrhythmias Atrial Fibrillation + pacemaker + Cardiac Defibrillator  Rhythm:Regular Rate:Normal - Systolic murmurs Pacemaker indications:symptomatic bradycardia Model #: dual lead inserted 03/03/15 Med[employer] [phone] Date of Last Interrogation: 12/30/18 (every 6 months) normal functioning, estimated longevity 5.5 yrs afib burden was 100% . EKG (02/19/19 atrial paced rhythm with prolonged AV conduction with occ PVC ) Hyperlipidemia - on Atorvastatin Last cardiology visit 03/21/19  03/03/19 NM Myocardial perfusion - no evidence of reversible ischemia 03/03/19 Echo EF>55% LV function normal    Neuro/Psych negative neurological ROS  negative psych ROS   GI/Hepatic neg GERD  ,(+)     (-) substance abuse  ,   Endo/Other  neg diabetes  Renal/GU negative Renal ROS     Musculoskeletal   Abdominal   Peds  Hematology  (+) Blood dyscrasia, anemia ,   Anesthesia Other Findings Past Medical History: No date: BPH (benign prostatic hyperplasia) No date: Cancer ([place])     Comment:  skin cancer on face carcinoma No date: ED (erectile dysfunction) No date: Rosanna Randy disease No date: HLD  (hyperlipidemia) No date: Hypertension No date: Macular degeneration No date: Osteopenia No date: Presence of permanent cardiac pacemaker No date: Sensorineural hearing loss  Reproductive/Obstetrics                             Anesthesia Physical  Anesthesia Plan  ASA: III  Anesthesia Plan: General   Post-op Pain Management:    Induction: Intravenous  PONV Risk Score and Plan: 2 and Ondansetron and Dexamethasone  Airway Management Planned: LMA  Additional Equipment: None  Intra-op Plan:   Post-operative Plan: Extubation in OR  Informed Consent: I have reviewed the patients History and Physical, chart, labs and discussed the procedure including the risks, benefits and alternatives for the proposed anesthesia with the patient or authorized representative who has indicated his/her understanding and acceptance.     Dental advisory given  Plan Discussed with: CRNA and Surgeon  Anesthesia Plan Comments: (Discussed risks of anesthesia with patient, including PONV, sore throat, lip/dental damage. Rare risks discussed as well, such as cardiorespiratory and neurological sequelae. Patient understands. Patient pacer dependent. Will have magnet available if necessary, but since any EMI would be below umbilicus, low likelihood of interference.)        Anesthesia Quick Evaluation

## 2020-04-13 NOTE — H&P (Signed)
04/13/2020 9:49 AM   Christopher [name] 08-31-1927 [id number]  Referring provider: No referring provider defined for this encounter.  No chief complaint on file.   HPI: 86 y.o. male with a history of high-grade urothelial carcinoma status post TURBT 09/27/2019 and repeat biopsy 01/20/2020 which showed recurrent high-grade urothelial carcinoma.  He is on chronic anticoagulant therapy and due to intermittent gross hematuria was only able to receive 2 doses of BCG after his initial diagnosis.  Because of persistent low-grade gross hematuria he has not been able to receive BCG after his second biopsy and recent CT ordered by oncology showed small enhancing nodules in the bladder.  The site of prior resection was not enhancing.  He presents for cystoscopy under anesthesia with bladder biopsy versus TURBT.   PMH: Past Medical History:  Diagnosis Date  . BPH (benign prostatic hyperplasia)   . Cancer ([place])    skin cancer on face carcinoma  . ED (erectile dysfunction)   . Gilbert disease   . HLD (hyperlipidemia)   . Hypertension   . Macular degeneration   . Osteopenia   . Presence of permanent cardiac pacemaker   . Sensorineural hearing loss     Surgical History: Past Surgical History:  Procedure Laterality Date  . CATARACT EXTRACTION Bilateral   . CYSTOSCOPY WITH BIOPSY N/A 01/20/2020   Procedure: CYSTOSCOPY WITH BIOPSY;  Surgeon: Abbie Sons, MD;  Location: [facility] ORS;  Service: Urology;  Laterality: N/A;  . CYSTOSCOPY WITH FULGERATION N/A 01/20/2020   Procedure: CYSTOSCOPY WITH FULGERATION;  Surgeon: Abbie Sons, MD;  Location: [facility] ORS;  Service: Urology;  Laterality: N/A;  . HEMORRHOIDECTOMY WITH HEMORRHOID BANDING    . KNEE ARTHROSCOPY Left   . PACEMAKER INSERTION N/A 03/03/2015   Procedure: INSERTION DUAL LEAD PACEMAKER;  Surgeon: Isaias Cowman, MD;  Location: [facility] ORS;  Service: Cardiovascular;  Laterality: N/A;  . TONSILLECTOMY    . TRANSURETHRAL RESECTION OF  BLADDER TUMOR N/A 10/07/2019   Procedure: TRANSURETHRAL RESECTION OF BLADDER TUMOR (TURBT);  Surgeon: Abbie Sons, MD;  Location: [facility] ORS;  Service: Urology;  Laterality: N/A;    Home Medications:  . acetaminophen (TYLENOL) 500 MG tablet Take 1,000 mg by mouth every 6 (six) hours as needed for moderate pain.     Marland Kitchen alfuzosin (UROXATRAL) 10 MG 24 hr tablet Take 1 tablet (10 mg total) by mouth daily with breakfast. 90 tablet 3  . apixaban (ELIQUIS) 5 MG TABS tablet Take 5 mg by mouth 2 (two) times daily.     Marland Kitchen aspirin EC 81 MG tablet Take 81 mg by mouth daily.     Marland Kitchen atorvastatin (LIPITOR) 10 MG tablet Take 10 mg by mouth daily.     . calcium carbonate (OSCAL) 1500 (600 Ca) MG TABS tablet Take 600 mg of elemental calcium by mouth daily with breakfast.    . cholecalciferol (VITAMIN D) 25 MCG (1000 UNIT) tablet Take 1,000 Units by mouth daily.     . ferrous fumarate (HEMOCYTE - 106 MG FE) 325 (106 Fe) MG TABS tablet Take 325 mg of iron by mouth in the morning and at bedtime.    . finasteride (PROSCAR) 5 MG tablet Take 1 tablet (5 mg total) by mouth daily. 90 tablet 3  . metoprolol succinate (TOPROL-XL) 25 MG 24 hr tablet Take 25 mg by mouth daily.    . metoprolol succinate (TOPROL-XL) 50 MG 24 hr tablet Take 50 mg by mouth daily.     . Multiple Vitamins-Minerals (MENS  MULTIVITAMIN PO) Take 1 tablet by mouth daily.    . Multiple Vitamins-Minerals (PRESERVISION AREDS 2 PO) Take 1 tablet by mouth in the morning and at bedtime.    . Omega-3 Fatty Acids (FISH OIL) 1000 MG CAPS Take 1,000 mg by mouth daily.        Allergies:  Allergies  Allergen Reactions  . Atenolol     Happened a long time ago and can't recall    Family History: Family History  Problem Relation Age of Onset  . Diabetes Mother   . Lung cancer Father   . Cancer Paternal Uncle   . Prostate cancer Neg Hx   . Bladder Cancer Neg Hx   . Kidney cancer Neg Hx     Social History:  reports that he has  never smoked. He has never used smokeless tobacco. He reports current alcohol use of about 7.0 standard drinks of alcohol per week. He reports that he does not use drugs.   Physical Exam: BP 134/70   Pulse 88   Temp 98.2 F (36.8 C) (Temporal)   Resp 16   Wt 88.5 kg   SpO2 95%   BMI 27.20 kg/m   Constitutional:  [name] and oriented, No acute distress. HEENT: [place] AT, moist mucus membranes.  Trachea midline, no masses. Cardiovascular: No clubbing, cyanosis, or edema.  RRR Respiratory: Normal respiratory effort, no increased work of breathing.  Clear GI: Abdomen is soft, nontender, nondistended, no abdominal masses GU: No CVA tenderness Lymph: No cervical or inguinal lymphadenopathy. Skin: No rashes, bruises or suspicious lesions. Neurologic: Grossly intact, no focal deficits, moving all 4 extremities. Psychiatric: Normal mood and affect.   Assessment & Plan:   86 y.o. male with a history of high-grade urothelial carcinoma status post TURBT 09/27/2019 and repeat biopsy 01/20/2020 which showed recurrent high-grade urothelial carcinoma.  He is on chronic anticoagulant therapy and due to intermittent gross hematuria was only able to receive 2 doses of BCG after his initial diagnosis.  Because of persistent low-grade gross hematuria he has not been able to receive BCG after his second biopsy and recent CT ordered by oncology showed small enhancing nodules in the bladder.  The site of prior resection was not enhancing.  The procedure has been discussed in detail occluding potential risks of bleeding, infection, bladder perforation as well as anesthetic risks.  All questions were answered and he desires to proceed.   Abbie Sons, [place] [address] [place], [place] [address] [phone]

## 2020-04-13 NOTE — Transfer of Care (Signed)
Immediate Anesthesia Transfer of Care Note  Patient: [name]  Procedure(s) Performed: TRANSURETHRAL RESECTION OF BLADDER TUMOR (TURBT) (N/A )  Patient Location: PACU  Anesthesia Type:General  Level of Consciousness: awake and alert   Airway & Oxygen Therapy: Patient Spontanous Breathing  Post-op Assessment: Report given to RN and Post -op Vital signs reviewed and stable  Post vital signs: Reviewed and stable  Last Vitals:  Vitals Value Taken Time  BP 155/70 04/13/20 1120  Temp 36.1 C 04/13/20 1120  Pulse 62 04/13/20 1120  Resp 15 04/13/20 1120  SpO2 95 % 04/13/20 1120  Vitals shown include unvalidated device data.  Last Pain:  Vitals:   04/13/20 1120  TempSrc:   PainSc: 0-No pain         Complications: No complications documented.

## 2020-04-14 ENCOUNTER — Telehealth: Payer: Self-pay | Admitting: Urology

## 2020-04-14 NOTE — Telephone Encounter (Signed)
-----   Message from Abbie Sons, MD sent at 04/13/2020 11:44 AM EDT ----- Regarding: Catheter removal Please schedule catheter removal 1 week.  Does not need a voiding trial

## 2020-04-14 NOTE — Telephone Encounter (Signed)
APP MADE PT IS AWARE ° °Christopher [name] °

## 2020-04-15 ENCOUNTER — Telehealth: Payer: Self-pay | Admitting: Family Medicine

## 2020-04-15 LAB — SURGICAL PATHOLOGY

## 2020-04-15 NOTE — Telephone Encounter (Signed)
Please find out what the urine looks like in the tubing and not the catheter bag

## 2020-04-15 NOTE — Telephone Encounter (Signed)
Patient notified per [facility] to drink plenty of fluids tonight and call in the morning.

## 2020-04-15 NOTE — Telephone Encounter (Signed)
He states it's a brownish color and when it gets in the bag it's red and he has had a couple little blood clots.

## 2020-04-15 NOTE — Telephone Encounter (Signed)
Patient's wife called and states she was suppose to call within 24-48 hours after his surgery to see if he can get back on his blood thinners. He is still currently seeing blood in the catheter bag. Please advise

## 2020-04-16 ENCOUNTER — Ambulatory Visit: Payer: Self-pay | Admitting: Physician Assistant

## 2020-04-16 ENCOUNTER — Telehealth: Payer: Self-pay

## 2020-04-16 NOTE — Telephone Encounter (Signed)
ERROR

## 2020-04-16 NOTE — Telephone Encounter (Signed)
Okay to restart blood thinners

## 2020-04-16 NOTE — Telephone Encounter (Signed)
Pt calls back to triage line he states that he has been pushing fluids and that his urine is now clear.

## 2020-04-16 NOTE — Telephone Encounter (Signed)
Notified patient as instructed, patient pleased. Discussed follow-up appointments, patient agrees  

## 2020-04-18 ENCOUNTER — Other Ambulatory Visit: Payer: Self-pay | Admitting: Urology

## 2020-04-18 DIAGNOSIS — C679 Malignant neoplasm of bladder, unspecified: Secondary | ICD-10-CM

## 2020-04-19 ENCOUNTER — Ambulatory Visit
Admission: RE | Admit: 2020-04-19 | Discharge: 2020-04-19 | Disposition: A | Discharge status: Home / Self Care | Payer: Medicare HMO | Source: Ambulatory Visit | Attending: Urology | Admitting: Urology

## 2020-04-19 ENCOUNTER — Other Ambulatory Visit
Admission: RE | Admit: 2020-04-19 | Discharge: 2020-04-19 | Disposition: A | Discharge status: Home / Self Care | Payer: Medicare HMO | Source: Ambulatory Visit | Attending: Urology | Admitting: Urology

## 2020-04-19 ENCOUNTER — Other Ambulatory Visit: Payer: Self-pay

## 2020-04-19 ENCOUNTER — Ambulatory Visit: Payer: Medicare HMO | Admitting: Physician Assistant

## 2020-04-19 ENCOUNTER — Other Ambulatory Visit: Payer: Self-pay | Admitting: Radiology

## 2020-04-19 ENCOUNTER — Ambulatory Visit: Payer: Medicare HMO | Admitting: Anesthesiology

## 2020-04-19 ENCOUNTER — Encounter
Admission: RE | Disposition: A | Discharge status: Home / Self Care | Payer: Self-pay | Source: Ambulatory Visit | Attending: Urology

## 2020-04-19 ENCOUNTER — Encounter: Payer: Self-pay | Admitting: Urology

## 2020-04-19 DIAGNOSIS — R338 Other retention of urine: Secondary | ICD-10-CM

## 2020-04-19 DIAGNOSIS — Z809 Family history of malignant neoplasm, unspecified: Secondary | ICD-10-CM | POA: Insufficient documentation

## 2020-04-19 DIAGNOSIS — Z9841 Cataract extraction status, right eye: Secondary | ICD-10-CM | POA: Insufficient documentation

## 2020-04-19 DIAGNOSIS — R310 Gross hematuria: Secondary | ICD-10-CM

## 2020-04-19 DIAGNOSIS — I10 Essential (primary) hypertension: Secondary | ICD-10-CM | POA: Diagnosis not present

## 2020-04-19 DIAGNOSIS — Z801 Family history of malignant neoplasm of trachea, bronchus and lung: Secondary | ICD-10-CM | POA: Insufficient documentation

## 2020-04-19 DIAGNOSIS — N3289 Other specified disorders of bladder: Secondary | ICD-10-CM | POA: Insufficient documentation

## 2020-04-19 DIAGNOSIS — M1990 Unspecified osteoarthritis, unspecified site: Secondary | ICD-10-CM | POA: Insufficient documentation

## 2020-04-19 DIAGNOSIS — Z888 Allergy status to other drugs, medicaments and biological substances status: Secondary | ICD-10-CM | POA: Insufficient documentation

## 2020-04-19 DIAGNOSIS — Z20822 Contact with and (suspected) exposure to covid-19: Secondary | ICD-10-CM | POA: Diagnosis not present

## 2020-04-19 DIAGNOSIS — E785 Hyperlipidemia, unspecified: Secondary | ICD-10-CM | POA: Diagnosis not present

## 2020-04-19 DIAGNOSIS — M8580 Other specified disorders of bone density and structure, unspecified site: Secondary | ICD-10-CM | POA: Diagnosis not present

## 2020-04-19 DIAGNOSIS — Z85828 Personal history of other malignant neoplasm of skin: Secondary | ICD-10-CM | POA: Insufficient documentation

## 2020-04-19 DIAGNOSIS — Z9842 Cataract extraction status, left eye: Secondary | ICD-10-CM | POA: Insufficient documentation

## 2020-04-19 DIAGNOSIS — Z79899 Other long term (current) drug therapy: Secondary | ICD-10-CM | POA: Insufficient documentation

## 2020-04-19 DIAGNOSIS — Z950 Presence of cardiac pacemaker: Secondary | ICD-10-CM | POA: Diagnosis not present

## 2020-04-19 DIAGNOSIS — H3530 Unspecified macular degeneration: Secondary | ICD-10-CM | POA: Insufficient documentation

## 2020-04-19 DIAGNOSIS — Z7901 Long term (current) use of anticoagulants: Secondary | ICD-10-CM | POA: Insufficient documentation

## 2020-04-19 DIAGNOSIS — Z833 Family history of diabetes mellitus: Secondary | ICD-10-CM | POA: Insufficient documentation

## 2020-04-19 DIAGNOSIS — H905 Unspecified sensorineural hearing loss: Secondary | ICD-10-CM | POA: Diagnosis not present

## 2020-04-19 DIAGNOSIS — I4891 Unspecified atrial fibrillation: Secondary | ICD-10-CM | POA: Insufficient documentation

## 2020-04-19 DIAGNOSIS — C679 Malignant neoplasm of bladder, unspecified: Secondary | ICD-10-CM | POA: Insufficient documentation

## 2020-04-19 DIAGNOSIS — Z7982 Long term (current) use of aspirin: Secondary | ICD-10-CM | POA: Insufficient documentation

## 2020-04-19 HISTORY — PX: CYSTOSCOPY WITH FULGERATION: SHX6638

## 2020-04-19 LAB — SARS CORONAVIRUS 2 BY RT PCR (HOSPITAL ORDER, PERFORMED IN ~~LOC~~ HOSPITAL LAB): SARS Coronavirus 2: NEGATIVE

## 2020-04-19 LAB — BASIC METABOLIC PANEL
Anion gap: 10 (ref 5–15)
BUN: 21 mg/dL (ref 8–23)
CO2: 28 mmol/L (ref 22–32)
Calcium: 9.5 mg/dL (ref 8.9–10.3)
Chloride: 102 mmol/L (ref 98–111)
Creatinine, Ser: 1.03 mg/dL (ref 0.61–1.24)
GFR, Estimated: >60 mL/min (ref >60)
Glucose, Bld: 118 mg/dL — ABNORMAL HIGH (ref 70–99)
Potassium: 4.4 mmol/L (ref 3.5–5.1)
Sodium: 140 mmol/L (ref 135–145)

## 2020-04-19 LAB — CBC
HCT: 40.7 % (ref 39.0–52.0)
Hemoglobin: 13.3 g/dL (ref 13.0–17.0)
MCH: 31.7 pg (ref 26.0–34.0)
MCHC: 32.7 g/dL (ref 30.0–36.0)
MCV: 96.9 fL (ref 80.0–100.0)
Platelets: 209 10*3/uL (ref 150–400)
RBC: 4.2 MIL/uL — ABNORMAL LOW (ref 4.22–5.81)
RDW: 14.2 % (ref 11.5–15.5)
WBC: 8.4 10*3/uL (ref 4.0–10.5)
nRBC: 0 % (ref 0.0–0.2)

## 2020-04-19 SURGERY — CYSTOSCOPY, WITH BLADDER FULGURATION
Anesthesia: Choice

## 2020-04-19 MED ORDER — ORAL CARE MOUTH RINSE: 15.0000 mL | Freq: Once | OROMUCOSAL | Status: AC

## 2020-04-19 MED ORDER — CHLORHEXIDINE GLUCONATE 0.12 % MT SOLN
15.0000 mL | Freq: Once | OROMUCOSAL | Status: AC
  Administered 2020-04-19: 15 mL via OROMUCOSAL

## 2020-04-19 MED ORDER — LIDOCAINE HCL (PF) 2 % IJ SOLN
INTRAMUSCULAR | Status: AC
  Filled 2020-04-19: qty 5

## 2020-04-19 MED ORDER — ONDANSETRON HCL 4 MG/2ML IJ SOLN
INTRAMUSCULAR | Status: DC | PRN
  Administered 2020-04-19: 4 mg via INTRAVENOUS

## 2020-04-19 MED ORDER — PROPOFOL 10 MG/ML IV BOLUS
INTRAVENOUS | Status: DC | PRN
  Administered 2020-04-19: 30 mg via INTRAVENOUS
  Administered 2020-04-19: 70 mg via INTRAVENOUS

## 2020-04-19 MED ORDER — CEFUROXIME AXETIL 250 MG PO TABS: 250.0000 mg | ORAL_TABLET | Freq: Two times a day (BID) | ORAL | 0 refills | Status: AC

## 2020-04-19 MED ORDER — LIDOCAINE HCL (CARDIAC) PF 100 MG/5ML IV SOSY
PREFILLED_SYRINGE | INTRAVENOUS | Status: DC | PRN
  Administered 2020-04-19: 60 mg via INTRAVENOUS

## 2020-04-19 MED ORDER — OXYCODONE HCL 5 MG PO TABS: 5.0000 mg | ORAL_TABLET | Freq: Once | ORAL | Status: DC | PRN

## 2020-04-19 MED ORDER — FENTANYL CITRATE (PF) 100 MCG/2ML IJ SOLN: 25.0000 ug | INTRAMUSCULAR | Status: DC | PRN

## 2020-04-19 MED ORDER — PROPOFOL 10 MG/ML IV BOLUS
INTRAVENOUS | Status: AC
  Filled 2020-04-19: qty 20

## 2020-04-19 MED ORDER — SODIUM CHLORIDE 0.9 % IV SOLN
1.0000 g | INTRAVENOUS | Status: AC
  Administered 2020-04-19: 1 g via INTRAVENOUS
  Filled 2020-04-19: qty 1

## 2020-04-19 MED ORDER — LACTATED RINGERS IV SOLN: INTRAVENOUS | Status: DC | PRN

## 2020-04-19 MED ORDER — FENTANYL CITRATE (PF) 100 MCG/2ML IJ SOLN
INTRAMUSCULAR | Status: AC
  Filled 2020-04-19: qty 2

## 2020-04-19 MED ORDER — ONDANSETRON HCL 4 MG/2ML IJ SOLN: 4.0000 mg | Freq: Once | INTRAMUSCULAR | Status: DC | PRN

## 2020-04-19 MED ORDER — DEXAMETHASONE SODIUM PHOSPHATE 10 MG/ML IJ SOLN
INTRAMUSCULAR | Status: DC | PRN
  Administered 2020-04-19: 10 mg via INTRAVENOUS

## 2020-04-19 MED ORDER — OXYCODONE HCL 5 MG/5ML PO SOLN: 5.0000 mg | Freq: Once | ORAL | Status: DC | PRN

## 2020-04-19 MED ORDER — METOPROLOL SUCCINATE ER 25 MG PO TB24
75.0000 mg | ORAL_TABLET | Freq: Every day | ORAL | Status: DC
  Administered 2020-04-19: 75 mg via ORAL
  Filled 2020-04-19: qty 3

## 2020-04-19 MED ORDER — SODIUM CHLORIDE 0.9 % IV SOLN: INTRAVENOUS | Status: DC

## 2020-04-19 MED ORDER — FENTANYL CITRATE (PF) 100 MCG/2ML IJ SOLN
INTRAMUSCULAR | Status: DC | PRN
  Administered 2020-04-19: 25 ug via INTRAVENOUS

## 2020-04-19 MED ORDER — CHLORHEXIDINE GLUCONATE 0.12 % MT SOLN
OROMUCOSAL | Status: AC
  Filled 2020-04-19: qty 15

## 2020-04-19 MED ORDER — PHENYLEPHRINE HCL (PRESSORS) 10 MG/ML IV SOLN
INTRAVENOUS | Status: DC | PRN
  Administered 2020-04-19 (×3): 100 ug via INTRAVENOUS

## 2020-04-19 SURGICAL SUPPLY — 25 items
BAG DRAIN CYSTO-URO LG1000N (MISCELLANEOUS) IMPLANT
BAG DRN LRG CPC RND TRDRP CNTR (MISCELLANEOUS) ×1
BAG URO DRAIN 4000ML (MISCELLANEOUS) ×3 IMPLANT
CATH FOL 2WAY LX 22X30 (CATHETERS) ×3 IMPLANT
COVER WAND RF STERILE (DRAPES) ×3 IMPLANT
ELECT BIVAP BIPO 22/24 DONUT (ELECTROSURGICAL) ×3
ELECT REM PT RETURN 9FT ADLT (ELECTROSURGICAL) ×3
ELECTRD BIVAP BIPO 22/24 DONUT (ELECTROSURGICAL) ×1 IMPLANT
ELECTRODE REM PT RTRN 9FT ADLT (ELECTROSURGICAL) ×1 IMPLANT
GLOVE BIOGEL PI IND STRL 7.5 (GLOVE) ×1 IMPLANT
GLOVE BIOGEL PI INDICATOR 7.5 (GLOVE) ×2
GOWN STRL REUS W/ TWL LRG LVL3 (GOWN DISPOSABLE) ×1 IMPLANT
GOWN STRL REUS W/ TWL XL LVL3 (GOWN DISPOSABLE) ×1 IMPLANT
GOWN STRL REUS W/TWL LRG LVL3 (GOWN DISPOSABLE) ×3
GOWN STRL REUS W/TWL XL LVL3 (GOWN DISPOSABLE) ×3
GUIDEWIRE STR DUAL SENSOR (WIRE) ×3 IMPLANT
INTRODUCER DILATOR DOUBLE (INTRODUCER) IMPLANT
PACK CYSTO AR (MISCELLANEOUS) ×3 IMPLANT
SET CYSTO W/LG BORE CLAMP LF (SET/KITS/TRAYS/PACK) ×3 IMPLANT
SHEATH URETERAL 12FRX35CM (MISCELLANEOUS) IMPLANT
SOL .9 NS 3000ML IRR  AL (IV SOLUTION)
SOL .9 NS 3000ML IRR AL (IV SOLUTION)
SOL .9 NS 3000ML IRR UROMATIC (IV SOLUTION) IMPLANT
WATER STERILE IRR 1000ML POUR (IV SOLUTION) ×3 IMPLANT
WATER STERILE IRR 3000ML UROMA (IV SOLUTION) ×3 IMPLANT

## 2020-04-19 NOTE — Transfer of Care (Signed)
Immediate Anesthesia Transfer of Care Note  Patient: Christopher [name]  Procedure(s) Performed: [place] with clot evacuation (N/A )  Patient Location: PACU  Anesthesia Type:General  Level of Consciousness: sedated  Airway & Oxygen Therapy: Patient Spontanous Breathing and Patient connected to nasal cannula oxygen  Post-op Assessment: Report given to RN and Post -op Vital signs reviewed and stable  Post vital signs: Reviewed and stable  Last Vitals:  Vitals Value Taken Time  BP    Temp    Pulse    Resp    SpO2      Last Pain:  Vitals:   04/19/20 1424  TempSrc: Temporal         Complications: No complications documented.

## 2020-04-19 NOTE — Progress Notes (Signed)
Pt d/c home with foley catheter in place.  NAD, Denies pain.  Tolerating PO fluids.  Safety maintained.

## 2020-04-19 NOTE — Progress Notes (Signed)
Patient presented to clinic today for postoperative catheter removal s/p TURBT with Dr. Bernardo Heater on 04/13/2020.  Patient reports that his urine was initially clear following surgery, however it became grossly bloody after restarting Eliquis on POD 3.  He reports feeling anxious today and is concerned that his catheter is not draining well.  Coagulated blood visible occluding half of the drainage tubing to his night bag.  I attempted to irrigate the patient's bladder in clinic today using the 20 French Foley catheter in place.  I was able to clear approximately 5 cc of clot material through this catheter, however I met significant resistance with pulling back on the irrigation syringe concerning for occlusive clot.  Ultimately, I elected to exchange the patient's 20 French Foley catheter to a 24 [place] coud catheter to ease bladder irrigation.  I was able to clear approximately 20 cc of clot material from the patient's bladder through the 24 [place] coud, but again met significant resistance with pulling back on the irrigation syringe.  To my concerns for possible organized clot within the bladder, I requested Dr. Bernardo Heater to evaluate the patient for possible clot evacuation in the OR.  Patient reports drinking a sip of water this morning with his medications.  No food intake since yesterday.  Cath Change/ Replacement  Patient is present today for a catheter change due to clot retention.  32m of water was removed from the balloon, a 20FR foley cath was removed without difficulty.  Patient was cleaned and prepped in a sterile fashion with betadine and 2% lidocaine jelly was instilled into the urethra. A 24 FR coude foley cath was replaced into the bladder no complications were noted Urine return was noted and urine was cherry red in color. The balloon was filled with 155mof sterile water.    Performed by: SaDebroah LoopPA-C and SaFonnie JarvisCMA  Bladder Irrigation  Due to clot retention  patient is present today for a bladder irrigation. Patient was cleaned and prepped in a sterile fashion. 240 ml of sterile water was instilled and irrigated into the bladder with a [id number] syringe through the catheter in place.  After irrigation urine flow was noted as a slow trickle with difficulty pulling back on the syringe consistent with retained clot.  Patient tolerated well.   Performed by: SamDebroah LoopA-C and SarFonnie JarvisMA

## 2020-04-19 NOTE — Anesthesia Preprocedure Evaluation (Addendum)
Anesthesia Evaluation  Patient identified by MRN, date of birth, ID band Patient awake    Reviewed: Allergy & Precautions, H&P , NPO status , Patient's Chart, lab work & pertinent test results  History of Anesthesia Complications Negative for: history of anesthetic complications  Airway Mallampati: II  TM Distance: >3 FB     Dental  (+) Teeth Intact   Pulmonary neg pulmonary ROS, neg sleep apnea, neg COPD,    breath sounds clear to auscultation       Cardiovascular hypertension, (-) angina(-) Past MI and (-) Cardiac Stents + dysrhythmias Atrial Fibrillation + pacemaker  Rhythm:irregular Rate:Normal     Neuro/Psych negative neurological ROS  negative psych ROS   GI/Hepatic negative GI ROS, Neg liver ROS,   Endo/Other  negative endocrine ROS  Renal/GU      Musculoskeletal  (+) Arthritis ,   Abdominal   Peds  Hematology negative hematology ROS (+)   Anesthesia Other Findings Past Medical History: No date: BPH (benign prostatic hyperplasia) No date: Cancer ([place])     Comment:  skin cancer on face carcinoma No date: ED (erectile dysfunction) No date: Rosanna Randy disease No date: HLD (hyperlipidemia) No date: Hypertension No date: Macular degeneration No date: Osteopenia No date: Presence of permanent cardiac pacemaker No date: Sensorineural hearing loss  Past Surgical History: No date: CATARACT EXTRACTION; Bilateral 01/20/2020: CYSTOSCOPY WITH BIOPSY; N/A     Comment:  Procedure: CYSTOSCOPY WITH BIOPSY;  Surgeon: Abbie Sons, MD;  Location: [facility] ORS;  Service: Urology;                Laterality: N/A; 01/20/2020: CYSTOSCOPY WITH FULGERATION; N/A     Comment:  Procedure: CYSTOSCOPY WITH FULGERATION;  Surgeon:               Abbie Sons, MD;  Location: [facility] ORS;  Service:               Urology;  Laterality: N/A; No date: HEMORRHOIDECTOMY WITH HEMORRHOID BANDING No date: KNEE ARTHROSCOPY;  Left 03/03/2015: PACEMAKER INSERTION; N/A     Comment:  Procedure: INSERTION DUAL LEAD PACEMAKER;  Surgeon:               Isaias Cowman, MD;  Location: [facility] ORS;  Service:               Cardiovascular;  Laterality: N/A; No date: TONSILLECTOMY 10/07/2019: TRANSURETHRAL RESECTION OF BLADDER TUMOR; N/A     Comment:  Procedure: TRANSURETHRAL RESECTION OF BLADDER TUMOR               (TURBT);  Surgeon: Abbie Sons, MD;  Location: [facility]               ORS;  Service: Urology;  Laterality: N/A; 04/13/2020: TRANSURETHRAL RESECTION OF BLADDER TUMOR; N/A     Comment:  Procedure: TRANSURETHRAL RESECTION OF BLADDER TUMOR               (TURBT);  Surgeon: Abbie Sons, MD;  Location: [facility]               ORS;  Service: Urology;  Laterality: N/A;     Reproductive/Obstetrics negative OB ROS                            Anesthesia Physical Anesthesia Plan  ASA: II  Anesthesia Plan: General LMA  Post-op Pain Management:    Induction:   PONV Risk Score and Plan: Dexamethasone, Ondansetron, Midazolam and Treatment may vary due to age or medical condition  Airway Management Planned:   Additional Equipment:   Intra-op Plan:   Post-operative Plan:   Informed Consent: I have reviewed the patients History and Physical, chart, labs and discussed the procedure including the risks, benefits and alternatives for the proposed anesthesia with the patient or authorized representative who has indicated his/her understanding and acceptance.     Dental Advisory Given  Plan Discussed with: Anesthesiologist, CRNA and Surgeon  Anesthesia Plan Comments:        Anesthesia Quick Evaluation

## 2020-04-19 NOTE — Discharge Instructions (Signed)
   May follow discharge instructions from last week  Hold Eliquis and aspirin.  Will contact you tomorrow regarding when to resume after I talked to Dr. Ubaldo Glassing  Please call our office [phone] for catheter drainage problems, fever greater than 101 degrees or questions/concerns  Antibiotic Rx was sent to your pharmacy

## 2020-04-19 NOTE — Anesthesia Procedure Notes (Signed)
Procedure Name: LMA Insertion Date/Time: 04/19/2020 4:20 PM Performed by: Nelda Marseille, CRNA Pre-anesthesia Checklist: Patient identified, Patient being monitored, Timeout performed, Emergency Drugs available and Suction available Patient Re-evaluated:Patient Re-evaluated prior to induction Oxygen Delivery Method: Circle system utilized Preoxygenation: Pre-oxygenation with 100% oxygen Induction Type: IV induction Ventilation: Mask ventilation without difficulty LMA: LMA inserted LMA Size: 4.0 Tube type: Oral Number of attempts: 1 Placement Confirmation: positive ETCO2 and breath sounds checked- equal and bilateral Tube secured with: Tape Dental Injury: Teeth and Oropharynx as per pre-operative assessment

## 2020-04-19 NOTE — Op Note (Signed)
Preoperative diagnosis:  1. Gross hematuria 2. Clot retention  Postoperative diagnosis:  1. Same  Procedure: 1. Cystoscopy with clot evacuation 2. Fulguration bladder/prostate  Surgeon: Abbie Sons, MD  Anesthesia: General  Complications: None  Intraoperative findings:  1.  Venous oozing prostatic urethra 2.  Venous oozing posterior bladder wall 3.   Moderate clot evacuated  EBL: Minimal  Specimens: None  Indication: Christopher [name] is a 86 y.o. with high-grade urothelial carcinoma the bladder who underwent TURBT last week of 18 small bladder tumors.  Urine was clear 3 days postop and he restarted ASA/Eliquis with recurrent hematuria the next day which has persisted.  He continued his Eliquis.  No significant clot could be aspirated with his indwelling Foley.  A 24 French hematuria catheter was placed with return of a small amount of clot.  After reviewing the management options for treatment, he elected to proceed with the above surgical procedure(s). We have discussed the potential benefits and risks of the procedure, side effects of the proposed treatment, the likelihood of the patient achieving the goals of the procedure, and any potential problems that might occur during the procedure or recuperation. Informed consent has been obtained.  Description of procedure:  The patient was taken to the operating room and general anesthesia was induced.  The patient was placed in the dorsal lithotomy position, prepped and draped in the usual sterile fashion, and preoperative antibiotics were administered. A preoperative time-out was performed.   A 24 French resectoscope sheath with obturator was lubricated and passed per urethra without difficulty.  Using a Toomey syringe with resectoscope adapter the bladder was irrigated and moderate amount of dark appearing clot was removed.  An Iglesias resectoscope with button electrode was then placed into the sheath.  There was clot adherent  to the posterior wall resection site which was removed.  Several areas of venous oozing were noted in that area which were fulgurated.  Scattered areas of the prostatic urethra were also oozing which were fulgurated.  With the irrigant turned on minimal flow no further bleeding sites were noted.  The resectoscope was removed.  A 22 French two-way Foley catheter was placed with return of pink-tinged effluent on irrigation.  The catheter was placed to gravity drainage and after anesthetic reversal he was transported to the PACU in stable condition.  Plan: He will be discharged with an indwelling Foley catheter.  We will check with his cardiologist to see how long we can hold his Eliquis/ASA.   Abbie Sons, M.D.

## 2020-04-20 ENCOUNTER — Encounter: Payer: Self-pay | Admitting: Urology

## 2020-04-20 NOTE — Anesthesia Postprocedure Evaluation (Signed)
Anesthesia Post Note  Patient: [name]  Procedure(s) Performed: [place] with clot evacuation (N/A )  Patient location during evaluation: PACU Anesthesia Type: General Level of consciousness: awake and alert Pain management: pain level controlled Vital Signs Assessment: post-procedure vital signs reviewed and stable Respiratory status: spontaneous breathing, nonlabored ventilation and respiratory function stable Cardiovascular status: blood pressure returned to baseline and stable Postop Assessment: no apparent nausea or vomiting Anesthetic complications: no   No complications documented.   Last Vitals:  Vitals:   04/19/20 1750 04/19/20 1804  BP:  (!) 169/79  Pulse: (!) 55 60  Resp: 15   Temp: (!) 36.4 C (!) 36.2 C  SpO2: 94% 98%    Last Pain:  Vitals:   04/19/20 1804  TempSrc:   PainSc: 0-No pain                 Brett Canales Clete Kuch

## 2020-04-21 ENCOUNTER — Other Ambulatory Visit: Payer: Self-pay | Admitting: Neurosurgery

## 2020-04-21 DIAGNOSIS — G894 Chronic pain syndrome: Secondary | ICD-10-CM

## 2020-04-23 NOTE — H&P (Signed)
04/19/20 4:59 PM   Christopher [name] 1927-12-18 [id number]  Referring provider: Ezequiel Kayser, MD [facility] [place],  [place] [address]  No chief complaint on file.   HPI: 86 y.o. male with recurrent urothelial carcinoma the bladder status post TURBT of multiple bladder tumors last week.  He is on chronic anticoagulation with Eliquis and aspirin.  His urine was clear last Friday and it was recommended that he resume aspirin and Eliquis.  He developed gross hematuria 24 hours after restarting Eliquis which has persisted.  He saw our PA today for possible catheter removal and was noted to have significant hematuria with inability to irrigate the Foley.  I placed a 24 [place] hematuria catheter and was able to aspirate a few small clots however the catheter was unable to be adequately irrigated.  He was added on to the OR schedule today for cystoscopy with fulguration and clot evacuation.   PMH: Past Medical History:  Diagnosis Date  . BPH (benign prostatic hyperplasia)   . Cancer ([place])    skin cancer on face carcinoma  . ED (erectile dysfunction)   . Gilbert disease   . HLD (hyperlipidemia)   . Hypertension   . Macular degeneration   . Osteopenia   . Presence of permanent cardiac pacemaker   . Sensorineural hearing loss     Surgical History: Past Surgical History:  Procedure Laterality Date  . CATARACT EXTRACTION Bilateral   . CYSTOSCOPY WITH BIOPSY N/A 01/20/2020   Procedure: CYSTOSCOPY WITH BIOPSY;  Surgeon: Abbie Sons, MD;  Location: [facility]S;  Service: Urology;  Laterality: N/A;  . CYSTOSCOPY WITH FULGERATION N/A 01/20/2020   Procedure: CYSTOSCOPY WITH FULGERATION;  Surgeon: Abbie Sons, MD;  Location: [facility];  Service: Urology;  Laterality: N/A;  . CYSTOSCOPY WITH FULGERATION N/A 04/19/2020   Procedure: [place] with clot evacuation;  Surgeon: Abbie Sons, MD;  Location: [facility];  Service: Urology;   Laterality: N/A;  . HEMORRHOIDECTOMY WITH HEMORRHOID BANDING    . KNEE ARTHROSCOPY Left   . PACEMAKER INSERTION N/A 03/03/2015   Procedure: INSERTION DUAL LEAD PACEMAKER;  Surgeon: Isaias Cowman, MD;  Location: [facility] OR[facility];  Service: Cardiovascular;  Laterality: N/A;  . TONSILLECTOMY    . TRANSURETHRAL RESECTION OF BLADDER TUMOR N/A 10/07/2019   Procedure: TRANSURETHRAL RESECTION OF BLADDER TUMOR (TURBT);  Surgeon: Abbie Sons, MD;  Location: [facility];  Service: Urology;  Laterality: N/A;  . TRANSURETHRAL RESECTION OF BLADDER TUMOR N/A 04/13/2020   Procedure: TRANSURETHRAL RESECTION OF BLADDER TUMOR (TURBT);  Surgeon: Abbie Sons, MD;  Location: [facility];  Service: Urology;  Laterality: N/A;    Home Medications:  Allergies as of 04/19/2020      Reactions   Atenolol    Happened a long time ago and can't recall      Medication List    STOP taking these medications   apixaban 5 MG Tabs tablet Commonly known as: ELIQUIS   aspirin EC 81 MG tablet     TAKE these medications   acetaminophen 500 MG tablet Commonly known as: TYLENOL Take 1,000 mg by mouth every 6 (six) hours as needed for moderate pain.   alfuzosin 10 MG 24 hr tablet Commonly known as: UROXATRAL Take 1 tablet (10 mg total) by mouth daily with breakfast.   atorvastatin 10 MG tablet Commonly known as: LIPITOR Take 10 mg by mouth every morning.   calcium carbonate 1500 (600 Ca) MG Tabs tablet Commonly  known as: OSCAL Take 600 mg of elemental calcium by mouth daily with breakfast.   cefUROXime 250 MG tablet Commonly known as: CEFTIN Take 1 tablet (250 mg total) by mouth 2 (two) times daily with a meal for 7 days.   ferrous sulfate 325 (65 FE) MG tablet Take 325 mg by mouth in the morning and at bedtime.   finasteride 5 MG tablet Commonly known as: PROSCAR Take 1 tablet (5 mg total) by mouth daily. What changed: when to take this   Fish Oil 1200 MG Caps Take 1,200 mg by mouth daily.     metoprolol succinate 25 MG 24 hr tablet Commonly known as: TOPROL-XL Take 25 mg by mouth every morning.   metoprolol succinate 50 MG 24 hr tablet Commonly known as: TOPROL-XL Take 50 mg by mouth every morning.   multivitamin with minerals Tabs tablet Take 1 tablet by mouth daily.   PRESERVISION AREDS 2 PO Take 1 tablet by mouth in the morning and at bedtime.   traMADol 50 MG tablet Commonly known as: ULTRAM Take 1 tablet (50 mg total) by mouth every 12 (twelve) hours as needed for moderate pain.   Vitamin D3 50 MCG (2000 UT) Tabs Take 2,000 Units by mouth daily.       Allergies:  Allergies  Allergen Reactions  . Atenolol     Happened a long time ago and can't recall    Family History: Family History  Problem Relation Age of Onset  . Diabetes Mother   . Lung cancer Father   . Cancer Paternal Uncle   . Prostate cancer Neg Hx   . Bladder Cancer Neg Hx   . Kidney cancer Neg Hx     Social History:  reports that he has never smoked. He has never used smokeless tobacco. He reports current alcohol use of about 7.0 standard drinks of alcohol per week. He reports that he does not use drugs.   Physical Exam: BP (!) 169/79   Pulse 60   Temp (!) 97.1 F (36.2 C)   Resp 15   SpO2 98%   Constitutional:  [name] and oriented, No acute distress. HEENT: [place] AT, moist mucus membranes.  Trachea midline, no masses. Cardiovascular: No clubbing, cyanosis, or edema.  RRR Respiratory: Normal respiratory effort, no increased work of breathing.  Clear GI: Abdomen is soft, nontender, nondistended, no abdominal masses GU: Catheter draining bloody urine Skin: No rashes, bruises or suspicious lesions. Neurologic: Grossly intact, no focal deficits, moving all 4 extremities. Psychiatric: Normal mood and affect.   Assessment & Plan:    1.  Status post recent TURBT with gross hematuria on chronic anticoagulation  Since his catheter was unable to be irrigated adequately in office I  recommended cystoscopy under anesthesia with clot evacuation fulguration  The procedure was discussed in detail with him potential risks of continued bleeding, infection as well as anesthetic risks.  All questions were answered and he desires to proceed   Abbie Sons, [facility] [address] [place], [place] [address] [phone]

## 2020-04-26 ENCOUNTER — Ambulatory Visit: Payer: Medicare HMO | Admitting: Physician Assistant

## 2020-04-26 ENCOUNTER — Ambulatory Visit (INDEPENDENT_AMBULATORY_CARE_PROVIDER_SITE_OTHER): Payer: Medicare HMO | Admitting: Physician Assistant

## 2020-04-26 ENCOUNTER — Encounter: Payer: Self-pay | Admitting: Physician Assistant

## 2020-04-26 ENCOUNTER — Other Ambulatory Visit: Payer: Self-pay

## 2020-04-26 VITALS — BP 116/67 | HR 91 | Ht 71.0 in | Wt 190.0 lb

## 2020-04-26 DIAGNOSIS — R338 Other retention of urine: Secondary | ICD-10-CM

## 2020-04-26 NOTE — Progress Notes (Signed)
Catheter Removal  Patient is present today for a catheter removal.  18ml of water was drained from the balloon. A 22FR foley cath was removed from the bladder no complications were noted . Patient tolerated well.  Performed by: Debroah Loop, PA-C   Follow up/ Additional notes: Yellow urine today, holding Eliquis. Patient has not heard back from [facility] re: scheduling from referral; referral faxed 10/12. Counseled patient that he should expect to hear back from [facility] within the next week and to inform us if he does not hear from them during this time.

## 2020-04-27 ENCOUNTER — Encounter: Payer: Self-pay | Admitting: Urology

## 2020-04-29 ENCOUNTER — Telehealth: Payer: Self-pay | Admitting: Physician Assistant

## 2020-04-29 NOTE — Telephone Encounter (Signed)
Please contact the patient and inform him that per Dr. Bernardo Heater, he may resume Eliquis once his urine has remained free of gross hematuria for 48 hours.  If his gross hematuria returns, he should stop the medication again until his urine has cleared again for 48 hours.  He should not remain on Eliquis while he has gross hematuria.

## 2020-04-29 NOTE — Telephone Encounter (Signed)
Notified patient as advised. Patient verbalized understanding and states he will call if he sees anymore gross hematuria.

## 2020-05-03 ENCOUNTER — Ambulatory Visit: Payer: Medicare HMO | Admitting: Physician Assistant

## 2020-05-03 ENCOUNTER — Encounter: Payer: Self-pay | Admitting: Physician Assistant

## 2020-05-03 ENCOUNTER — Encounter: Payer: Self-pay | Admitting: Urology

## 2020-05-03 ENCOUNTER — Other Ambulatory Visit: Payer: Self-pay

## 2020-05-03 VITALS — BP 131/72 | HR 73 | Ht 71.0 in | Wt 195.0 lb

## 2020-05-03 DIAGNOSIS — R310 Gross hematuria: Secondary | ICD-10-CM

## 2020-05-03 LAB — BLADDER SCAN AMB NON-IMAGING: Scan Result: 103

## 2020-05-03 NOTE — Progress Notes (Signed)
Patient presented to clinic today for PVR for evaluation of difficulty urinating following catheter removal.  I removed his Foley in clinic on 04/26/2020, POD 7 from cystoscopy with clot evacuation and fulguration of the bladder and prostate with Dr. Bernardo Heater.  PVR 103 mL today.  He reports stable urinary symptoms of mild terminal dysuria, urgency, and frequency.  Counseled patient that he is emptying his bladder appropriately today, no further intervention indicated.  Patient notes he has not yet heard back from [facility] regarding his bladder cancer referral.  I have asked Sharyn Lull to reach out to [facility] regarding status on this.  Results for orders placed or performed in visit on 05/03/20  Bladder Scan (Post Void Residual) in office  Result Value Ref Range   Scan Result 103

## 2020-05-04 ENCOUNTER — Telehealth: Payer: Self-pay

## 2020-05-04 ENCOUNTER — Ambulatory Visit (HOSPITAL_COMMUNITY)
Admission: RE | Admit: 2020-05-04 | Discharge: 2020-05-04 | Disposition: A | Discharge status: Home / Self Care | Payer: Medicare HMO | Source: Ambulatory Visit | Attending: Physician Assistant | Admitting: Physician Assistant

## 2020-05-04 ENCOUNTER — Ambulatory Visit (HOSPITAL_COMMUNITY)
Admission: RE | Admit: 2020-05-04 | Discharge: 2020-05-04 | Disposition: A | Discharge status: Home / Self Care | Payer: Medicare HMO | Source: Ambulatory Visit | Attending: Neurosurgery | Admitting: Neurosurgery

## 2020-05-04 DIAGNOSIS — Z950 Presence of cardiac pacemaker: Secondary | ICD-10-CM

## 2020-05-04 DIAGNOSIS — G894 Chronic pain syndrome: Secondary | ICD-10-CM

## 2020-05-04 IMAGING — MR MR THORACIC SPINE W/O CM
4 of 6 series · 19 of 48 positions shown · non-contrast
Comparison: CT of the chest [DATE].

CLINICAL DATA: Chronic pain syndrome.

EXAM:
MRI THORACIC SPINE WITHOUT CONTRAST
TECHNIQUE: Multiplanar, multisequence MR imaging of the thoracic spine was
performed. No intravenous contrast was administered.

[Series 4: counting loc · sagittal · 4.0mm · 0.90mm/px · 3 of 15 slices shown]
[im 3/15]
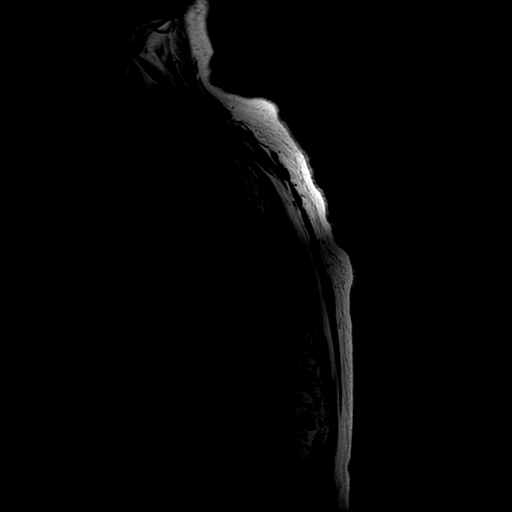
[im 9/15]
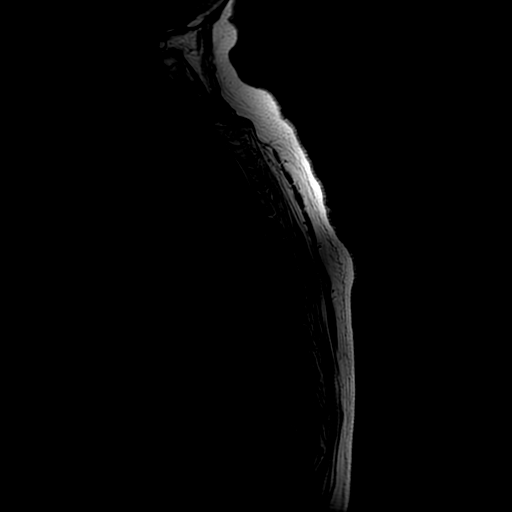
[im 15/15]
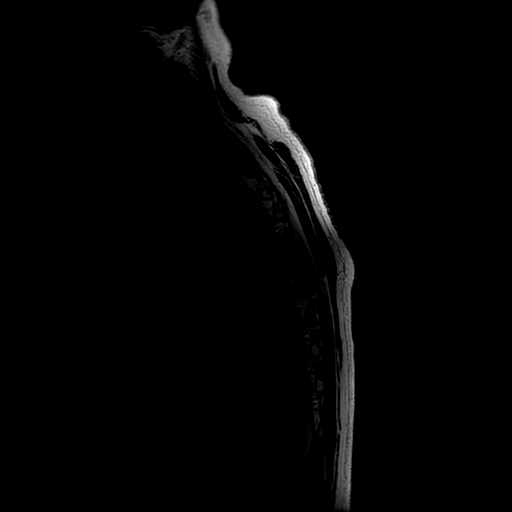

[Series 5: T2 · sagittal · 3.0mm · 0.66mm/px · 6 of 20 slices shown (1 of 2)]
[im 1/20]
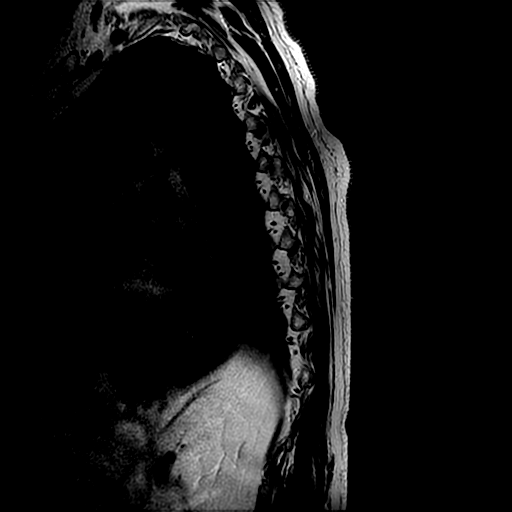
[im 4/20]
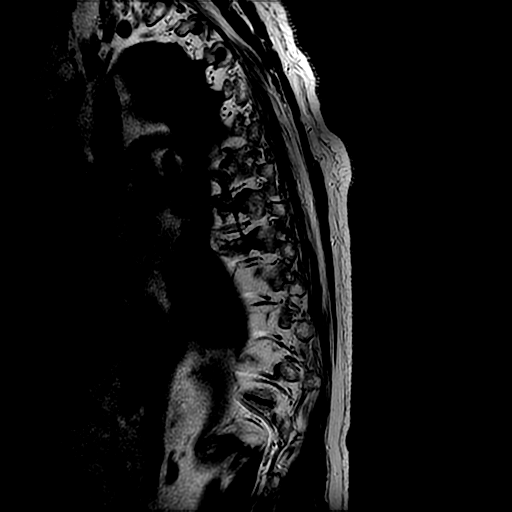
[im 8/20]
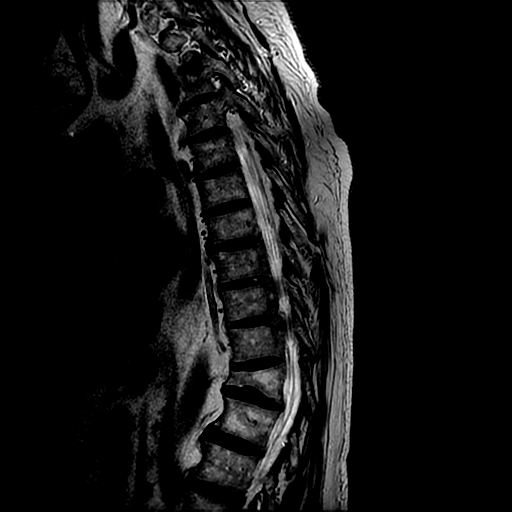
[im 12/20]
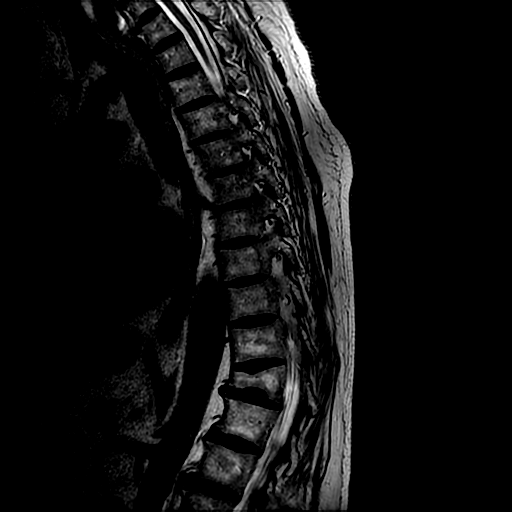
[im 16/20]
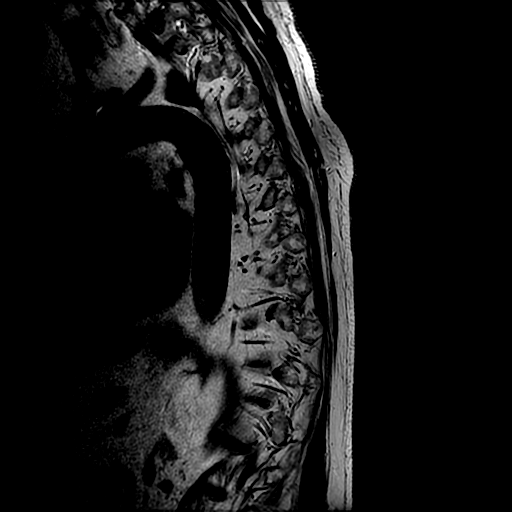
[im 20/20]
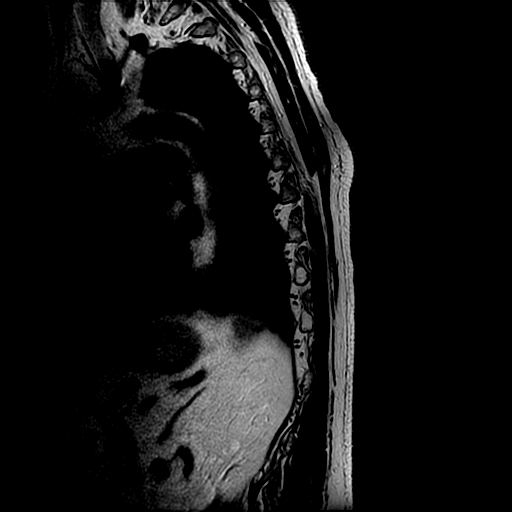

[Series 7: T1 · sagittal · 3.0mm · 0.66mm/px · 3 of 20 slices shown]
[im 4/20]
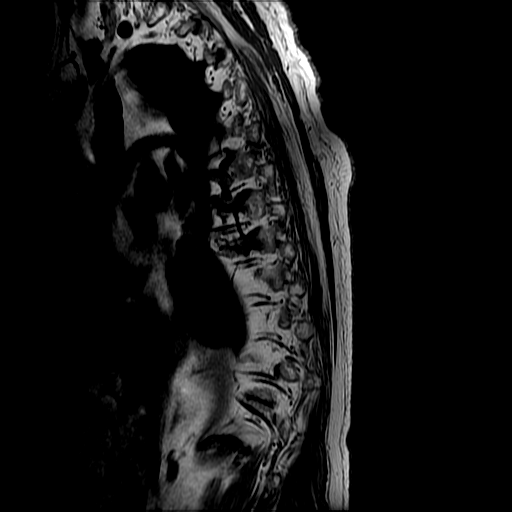
[im 12/20]
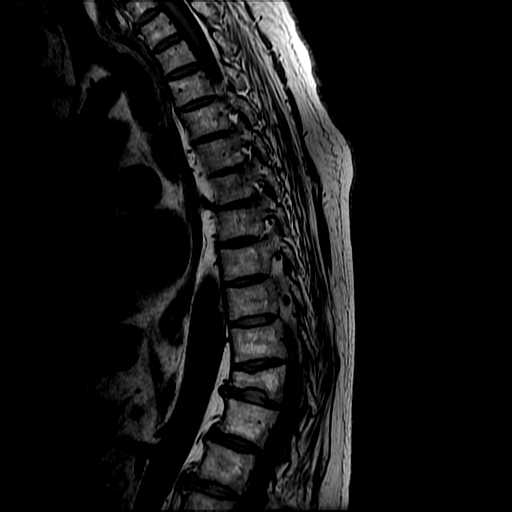
[im 20/20]
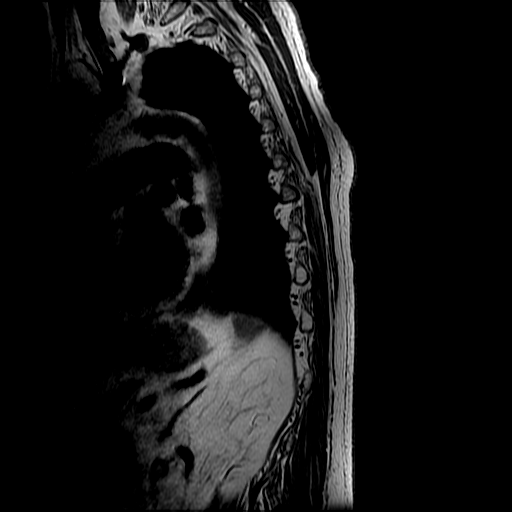

[Series 9: T2 · axial · 4.0mm · 0.43mm/px · z∈[-245,-49]mm · 7 of 40 slices shown (2 of 2)]
[im 1/40]
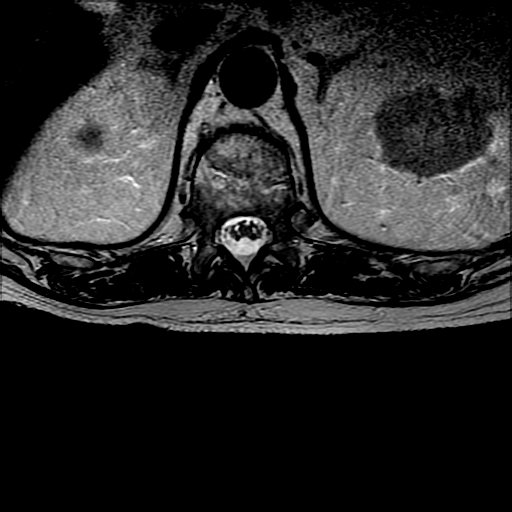
[im 8/40]
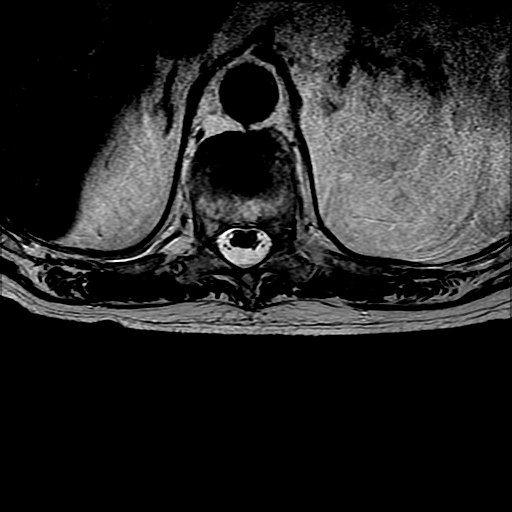
[im 11/40]
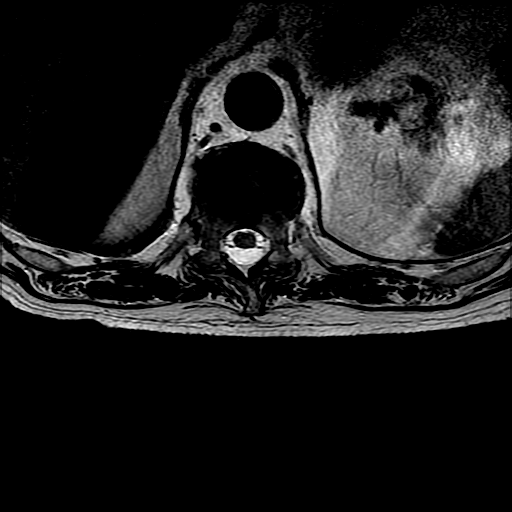
[im 18/40]
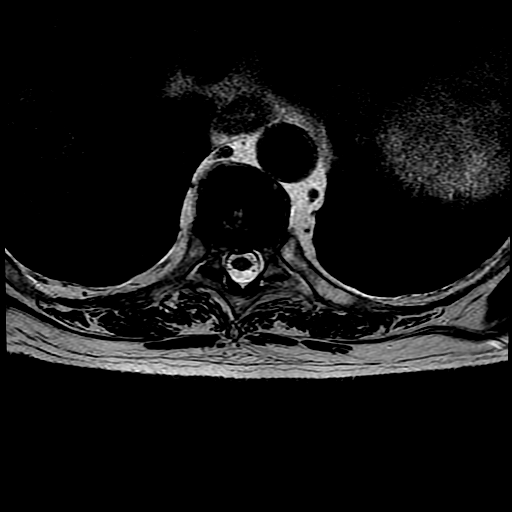
[im 22/40]
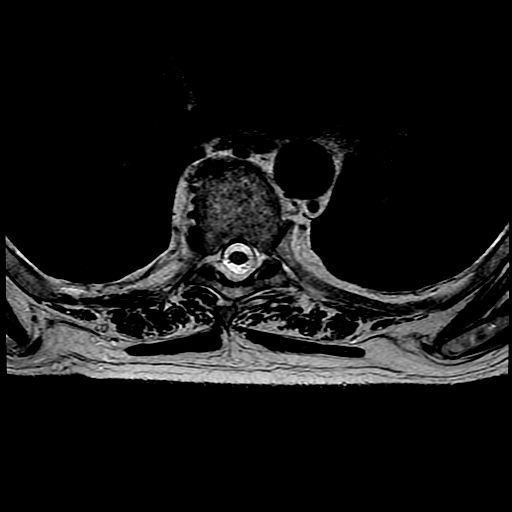
[im 29/40]
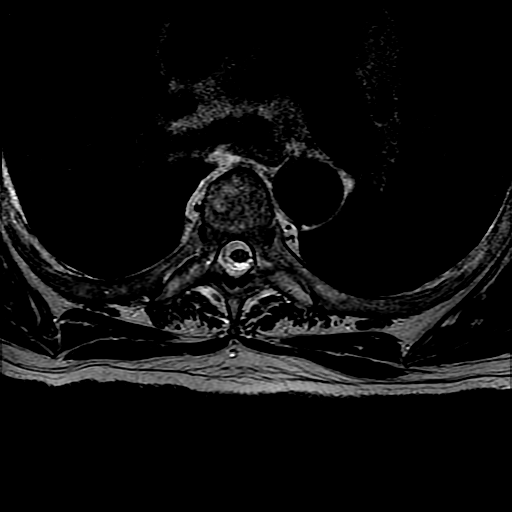
[im 36/40]
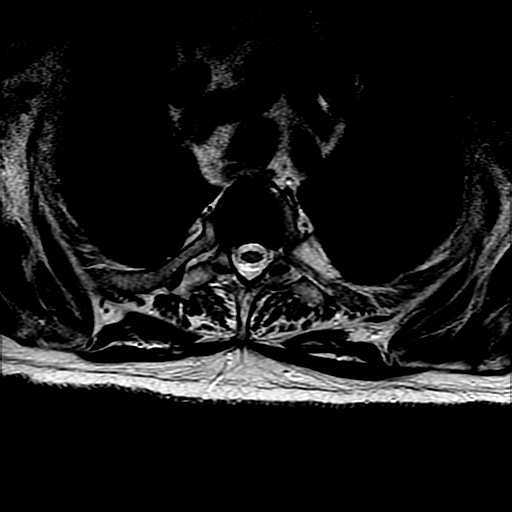

[19 of 48 positions shown; findings below may reference images not displayed]

FINDINGS: Alignment: Lower thoracic kyphosis related to T11 compression
fracture.

Vertebrae: Chronic compression fracture of T11 with loss of
approximately 70% of vertebral body height. No marrow edema or
retropulsion. This appear similar to prior CT.

Cord:  Normal signal and morphology.

Paraspinal and other soft tissues: Negative.

Disc levels:

At T8-9, a small posterior disc protrusion without significant
spinal canal or neural foraminal stenosis.

At T11-12: Small posterior disc protrusion without significant
spinal canal or neural foraminal stenosis.

No significant disc bulge or herniation spinal canal or neural
foraminal stenosis at other levels.
IMPRESSION: 1. Chronic compression fracture of T11 with loss of approximately
70% of vertebral body height. No marrow edema or retropulsion.
2. Mild degenerative disc disease of the thoracic spine. No
significant spinal canal or neural foraminal stenosis at any level.

## 2020-05-04 IMAGING — CR DG CHEST 1V
1 series · 1 of 1 positions shown · non-contrast
Comparison: [DATE]

CLINICAL DATA: Pacemaker placement

EXAM:
CHEST  1 VIEW

[chest pa]
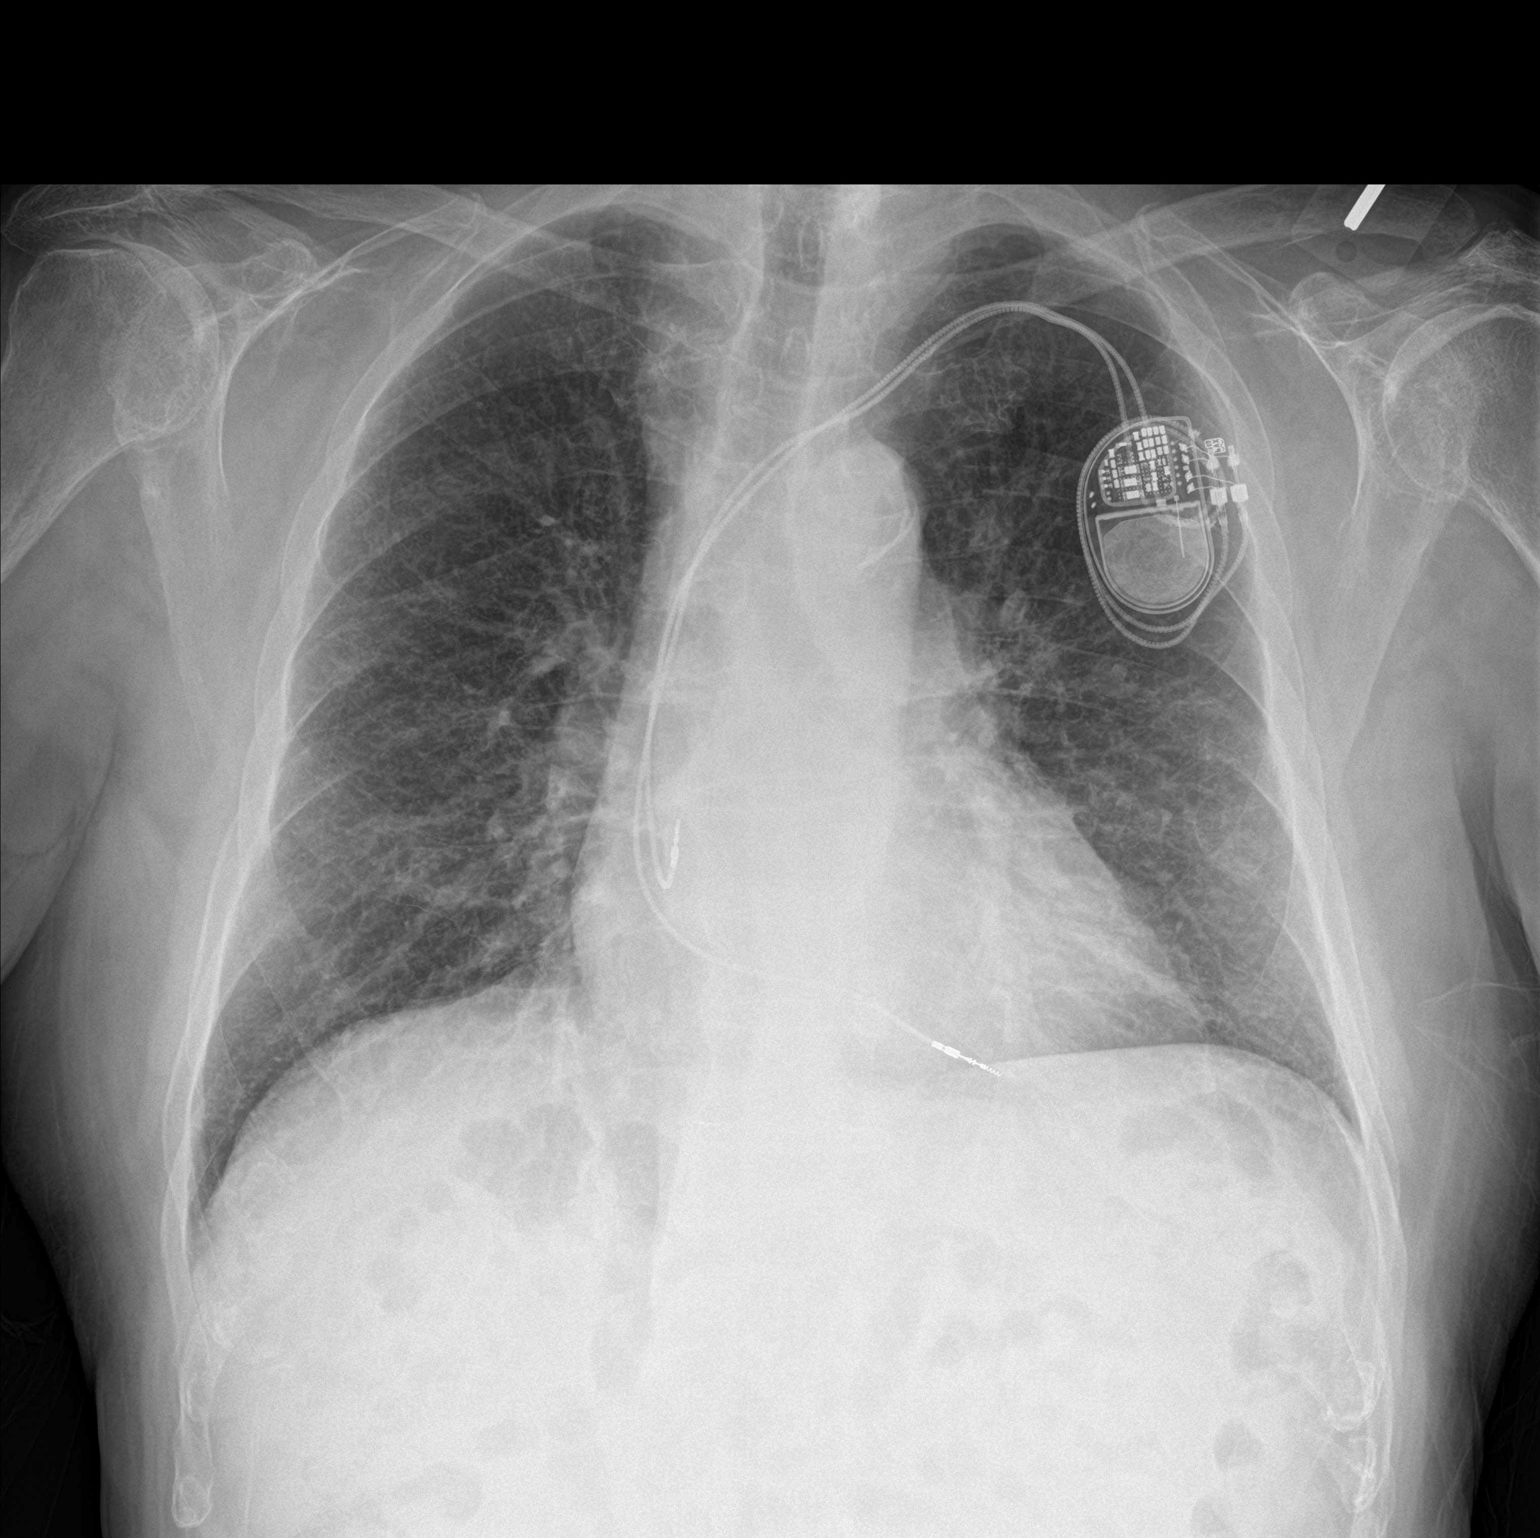

[1 of 1 positions shown; findings below may reference images not displayed]

FINDINGS: Diffuse bilateral mild chronic interstitial thickening. No focal
consolidation. No pleural effusion or pneumothorax. Heart and
mediastinal contours are unremarkable. Thoracic aortic
atherosclerosis. Dual lead cardiac pacemaker.

No acute osseous abnormality.
IMPRESSION: Dual lead cardiac pacemaker. No pneumothorax.

## 2020-05-04 NOTE — Telephone Encounter (Signed)
I called Christopher [name] back, he states his daughter called [facility] because he had not heard from [place]. They scheduled [facility] appt for [facility] day as [facility] so [facility] appt was [facility]. Patient states he prefers to go to [facility] so michelle is contacting [facility] again.  He also wanted me to let you know he is bleeding a lot more and now has to come off his Eliquis again.

## 2020-05-04 NOTE — Telephone Encounter (Signed)
Pt called asking to speak to you. He would not tell me why he said "sam will know why I am calling" and would not relay a message to me besides to have you call him back

## 2020-05-04 NOTE — Telephone Encounter (Signed)
If his [facility] appointment was canceled it may not be able to be rescheduled timely.  I would not recommend canceling his [facility] appointment because he needs to be seen as quickly as possible.

## 2020-05-04 NOTE — Progress Notes (Signed)
[facility] express sent to [facility]. Orders received for DOO 95. Will re-program patient once MRI is complete.

## 2020-06-07 ENCOUNTER — Encounter: Payer: Self-pay | Admitting: Urology

## 2020-06-21 ENCOUNTER — Ambulatory Visit: Payer: Medicare HMO | Admitting: Internal Medicine

## 2020-06-21 ENCOUNTER — Ambulatory Visit: Payer: Medicare HMO

## 2020-06-21 ENCOUNTER — Other Ambulatory Visit: Payer: Medicare HMO

## 2020-06-29 ENCOUNTER — Encounter: Payer: Self-pay | Admitting: Internal Medicine

## 2020-06-29 ENCOUNTER — Other Ambulatory Visit: Payer: Self-pay

## 2020-06-29 ENCOUNTER — Inpatient Hospital Stay: Payer: Medicare HMO

## 2020-06-29 ENCOUNTER — Inpatient Hospital Stay: Payer: Medicare HMO | Admitting: Internal Medicine

## 2020-06-29 ENCOUNTER — Inpatient Hospital Stay: Payer: Medicare HMO | Attending: Internal Medicine

## 2020-06-29 VITALS — BP 160/69 | HR 64 | Temp 97.6°F | Resp 16 | Ht 71.0 in | Wt 196.8 lb

## 2020-06-29 DIAGNOSIS — C679 Malignant neoplasm of bladder, unspecified: Secondary | ICD-10-CM | POA: Diagnosis not present

## 2020-06-29 DIAGNOSIS — Z85828 Personal history of other malignant neoplasm of skin: Secondary | ICD-10-CM | POA: Insufficient documentation

## 2020-06-29 DIAGNOSIS — E785 Hyperlipidemia, unspecified: Secondary | ICD-10-CM | POA: Diagnosis not present

## 2020-06-29 DIAGNOSIS — N400 Enlarged prostate without lower urinary tract symptoms: Secondary | ICD-10-CM | POA: Insufficient documentation

## 2020-06-29 DIAGNOSIS — M8580 Other specified disorders of bone density and structure, unspecified site: Secondary | ICD-10-CM | POA: Diagnosis not present

## 2020-06-29 DIAGNOSIS — I4891 Unspecified atrial fibrillation: Secondary | ICD-10-CM | POA: Diagnosis not present

## 2020-06-29 DIAGNOSIS — Z950 Presence of cardiac pacemaker: Secondary | ICD-10-CM | POA: Diagnosis not present

## 2020-06-29 DIAGNOSIS — D500 Iron deficiency anemia secondary to blood loss (chronic): Secondary | ICD-10-CM | POA: Diagnosis not present

## 2020-06-29 DIAGNOSIS — Z801 Family history of malignant neoplasm of trachea, bronchus and lung: Secondary | ICD-10-CM | POA: Diagnosis not present

## 2020-06-29 DIAGNOSIS — I10 Essential (primary) hypertension: Secondary | ICD-10-CM | POA: Insufficient documentation

## 2020-06-29 DIAGNOSIS — Z79899 Other long term (current) drug therapy: Secondary | ICD-10-CM | POA: Insufficient documentation

## 2020-06-29 DIAGNOSIS — Z7901 Long term (current) use of anticoagulants: Secondary | ICD-10-CM | POA: Insufficient documentation

## 2020-06-29 DIAGNOSIS — K668 Other specified disorders of peritoneum: Secondary | ICD-10-CM | POA: Diagnosis not present

## 2020-06-29 DIAGNOSIS — R911 Solitary pulmonary nodule: Secondary | ICD-10-CM | POA: Diagnosis not present

## 2020-06-29 LAB — CBC WITH DIFFERENTIAL/PLATELET
Abs Immature Granulocytes: 0.02 10*3/uL (ref 0.00–0.07)
Basophils Absolute: 0 10*3/uL (ref 0.0–0.1)
Basophils Relative: 1 %
Eosinophils Absolute: 0.2 10*3/uL (ref 0.0–0.5)
Eosinophils Relative: 4 %
HCT: 41.2 % (ref 39.0–52.0)
Hemoglobin: 13.5 g/dL (ref 13.0–17.0)
Immature Granulocytes: 0 %
Lymphocytes Relative: 14 %
Lymphs Abs: 0.9 10*3/uL (ref 0.7–4.0)
MCH: 31.8 pg (ref 26.0–34.0)
MCHC: 32.8 g/dL (ref 30.0–36.0)
MCV: 96.9 fL (ref 80.0–100.0)
Monocytes Absolute: 0.8 10*3/uL (ref 0.1–1.0)
Monocytes Relative: 12 %
Neutro Abs: 4.5 10*3/uL (ref 1.7–7.7)
Neutrophils Relative %: 69 %
Platelets: 200 10*3/uL (ref 150–400)
RBC: 4.25 MIL/uL (ref 4.22–5.81)
RDW: 14.5 % (ref 11.5–15.5)
WBC: 6.4 10*3/uL (ref 4.0–10.5)
nRBC: 0 % (ref 0.0–0.2)

## 2020-06-29 LAB — BASIC METABOLIC PANEL
Anion gap: 7 (ref 5–15)
BUN: 17 mg/dL (ref 8–23)
CO2: 30 mmol/L (ref 22–32)
Calcium: 9.3 mg/dL (ref 8.9–10.3)
Chloride: 102 mmol/L (ref 98–111)
Creatinine, Ser: 0.99 mg/dL (ref 0.61–1.24)
GFR, Estimated: >60 mL/min (ref >60)
Glucose, Bld: 109 mg/dL — ABNORMAL HIGH (ref 70–99)
Potassium: 4.4 mmol/L (ref 3.5–5.1)
Sodium: 139 mmol/L (ref 135–145)

## 2020-06-29 NOTE — Assessment & Plan Note (Addendum)
#  Iron deficiency anemia secondary to chronic blood loss  [see below]-12/11/2019- Hb 8.5; ferritin 15 [PCP]-currently on p.o. iron.  S/p  IV Venofer; Today Hb-13.2. Hold Venofer.  # High-grade noninvasive urothelial malignancy/hematuria--status post TURBT [facility]. No further hematuria. Defer to [facility] urology  # Omental/peritoneal thickening-unclear etiology; follow-up CT scan SEP 8th 2021--shows STABLE progressive omental/peritoneal thickening. Would recommend clinical surveillance with her imaging at this time.  # Lung nodule-/NISP- will repeat imaging in spring 2022. Discuss at next visit.  # DISPOSITION: # NO venofer infusion today # follow up in 6 months  MD; labs; cbc/bmp; iron studies/ferritin;; Possible Venofer-Dr.B

## 2020-06-29 NOTE — Progress Notes (Signed)
[facility] NOTE  Patient Care Team: Ezequiel Kayser, MD as PCP - General (Internal Medicine)  CHIEF COMPLAINTS/PURPOSE OF CONSULTATION: Peritoneal thickening/IDA  Oncology History Overview Note  # NOV 2020- [incidental/hematuria work-up]-subtle peritoneal thickening/plaque-like lesions; March 2021-CT scan progressive omental/peritoneal thickening  #Microscopic hematuria- sec to BPH [cystoscopy/CT urogram negative; Dr.Stoiff]; March 2021 CT scan-new lesion in the bladder diverticulum  # MAY-June 2021- severe IDA sec to hemauria [hb 8.5; Ferritin- ]  # on eliquis ? A.fib/pacemaker;   Urothelial carcinoma of bladder ([place])  10/29/2019 Initial Diagnosis   Urothelial carcinoma of bladder ([facility])      HISTORY OF PRESENTING ILLNESS:  [name] 86 y.o.  male with a history of peritoneal/omental thickening of unclear etiology; also superficial bladder malignancy/hematuria.  In the interim patient was evaluated by [facility] urology underwent TURBT for superficial bladder malignancy. Patient also had a PET scan.  Denies any blood in stools or black or stools. Denies any blood in urine. His energy levels are adequate. No new shortness of breath or cough.  Review of Systems  Constitutional: Negative for chills, diaphoresis, fever and weight loss.  HENT: Negative for nosebleeds and sore throat.   Eyes: Negative for double vision.  Respiratory: Negative for cough, hemoptysis, sputum production and wheezing.   Cardiovascular: Negative for chest pain, palpitations, orthopnea and leg swelling.  Gastrointestinal: Negative for abdominal pain, blood in stool, constipation, diarrhea, heartburn, melena, nausea and vomiting.  Genitourinary: Negative for dysuria, frequency and urgency.  Musculoskeletal: Negative for back pain and joint pain.  Skin: Negative.  Negative for itching and rash.  Neurological: Negative for dizziness, tingling, focal weakness, weakness and headaches.   Endo/Heme/Allergies: Does not bruise/bleed easily.  Psychiatric/Behavioral: Negative for depression. The patient is not nervous/anxious and does not have insomnia.      MEDICAL HISTORY:  Past Medical History:  Diagnosis Date  . BPH (benign prostatic hyperplasia)   . Cancer ([place])    skin cancer on face carcinoma  . ED (erectile dysfunction)   . Gilbert disease   . HLD (hyperlipidemia)   . Hypertension   . Macular degeneration   . Osteopenia   . Presence of permanent cardiac pacemaker   . Sensorineural hearing loss     SURGICAL HISTORY: Past Surgical History:  Procedure Laterality Date  . CATARACT EXTRACTION Bilateral   . CYSTOSCOPY WITH BIOPSY N/A 01/20/2020   Procedure: CYSTOSCOPY WITH BIOPSY;  Surgeon: Abbie Sons, MD;  Location: [facility];  Service: Urology;  Laterality: N/A;  . CYSTOSCOPY WITH FULGERATION N/A 01/20/2020   Procedure: CYSTOSCOPY WITH FULGERATION;  Surgeon: Abbie Sons, MD;  Location: [facility];  Service: Urology;  Laterality: N/A;  . CYSTOSCOPY WITH FULGERATION N/A 04/19/2020   Procedure: [place] with clot evacuation;  Surgeon: Abbie Sons, MD;  Location: [facility];  Service: Urology;  Laterality: N/A;  . HEMORRHOIDECTOMY WITH HEMORRHOID BANDING    . KNEE ARTHROSCOPY Left   . PACEMAKER INSERTION N/A 03/03/2015   Procedure: INSERTION DUAL LEAD PACEMAKER;  Surgeon: Isaias Cowman, MD;  Location: [facility];  Service: Cardiovascular;  Laterality: N/A;  . TONSILLECTOMY    . TRANSURETHRAL RESECTION OF BLADDER TUMOR N/A 10/07/2019   Procedure: TRANSURETHRAL RESECTION OF BLADDER TUMOR (TURBT);  Surgeon: Abbie Sons, MD;  Location: [facility];  Service: Urology;  Laterality: N/A;  . TRANSURETHRAL RESECTION OF BLADDER TUMOR N/A 04/13/2020   Procedure: TRANSURETHRAL RESECTION OF BLADDER TUMOR (TURBT);  Surgeon: Abbie Sons, MD;  Location: [facility];  Service: Urology;  Laterality: N/A;    SOCIAL HISTORY: Social History    Socioeconomic History  . Marital status: Widowed    Spouse name: Not on file  . Number of children: Not on file  . Years of education: Not on file  . Highest education level: Not on file  Occupational History  . Not on file  Tobacco Use  . Smoking status: Never Smoker  . Smokeless tobacco: Never Used  Vaping Use  . Vaping Use: Never used  Substance and Sexual Activity  . Alcohol use: Yes    Alcohol/week: 7.0 standard drinks    Types: 7 Glasses of wine per week  . Drug use: No  . Sexual activity: Yes    Birth control/protection: None  Other Topics Concern  . Not on file  Social History Narrative   Retd. Civil engineer, contracting; [place]; self. Never smoked; alcohol- glass wine/drink every night.     Social Determinants of Health   Financial Resource Strain: Not on file  Food Insecurity: Not on file  Transportation Needs: Not on file  Physical Activity: Not on file  Stress: Not on file  Social Connections: Not on file  Intimate Partner Violence: Not on file    FAMILY HISTORY: Family History  Problem Relation Age of Onset  . Diabetes Mother   . Lung cancer Father   . Cancer Paternal Uncle   . Prostate cancer Neg Hx   . Bladder Cancer Neg Hx   . Kidney cancer Neg Hx     ALLERGIES:  is allergic to atenolol.  MEDICATIONS:  Current Outpatient Medications  Medication Sig Dispense Refill  . acetaminophen (TYLENOL) 500 MG tablet Take 1,000 mg by mouth every 6 (six) hours as needed for moderate pain.     Marland Kitchen alfuzosin (UROXATRAL) 10 MG 24 hr tablet Take 1 tablet (10 mg total) by mouth daily with breakfast. 90 tablet 3  . apixaban (ELIQUIS) 5 MG TABS tablet Take by mouth.    Marland Kitchen atorvastatin (LIPITOR) 10 MG tablet Take 10 mg by mouth every morning.     . calcium carbonate (OSCAL) 1500 (600 Ca) MG TABS tablet Take 600 mg of elemental calcium by mouth daily with breakfast.    . Cholecalciferol (VITAMIN D3) 50 MCG (2000 UT) TABS Take 2,000 Units by mouth daily.    . ferrous  sulfate 325 (65 FE) MG tablet Take 325 mg by mouth in the morning and at bedtime.    . finasteride (PROSCAR) 5 MG tablet Take 1 tablet (5 mg total) by mouth daily. (Patient taking differently: Take 5 mg by mouth every morning.) 90 tablet 3  . metoprolol succinate (TOPROL-XL) 25 MG 24 hr tablet Take 25 mg by mouth every morning.     . metoprolol succinate (TOPROL-XL) 50 MG 24 hr tablet Take 50 mg by mouth every morning.     . Multiple Vitamin (MULTIVITAMIN WITH MINERALS) TABS tablet Take 1 tablet by mouth daily.    . Multiple Vitamins-Minerals (PRESERVISION AREDS 2 PO) Take 1 tablet by mouth in the morning and at bedtime.    . Omega-3 Fatty Acids (FISH OIL) 1200 MG CAPS Take 1,200 mg by mouth daily.    . traMADol (ULTRAM) 50 MG tablet Take 1 tablet (50 mg total) by mouth every 12 (twelve) hours as needed for moderate pain. 10 tablet 0  . tamsulosin (FLOMAX) 0.4 MG CAPS capsule Take 0.4 mg by mouth daily.     No current facility-administered medications for this visit.      Marland Kitchen  PHYSICAL EXAMINATION: ECOG PERFORMANCE STATUS: 0 - Asymptomatic  Vitals:   06/29/20 1308  BP: (!) 160/69  Pulse: 64  Resp: 16  Temp: 97.6 F (36.4 C)  SpO2: 100%   Filed Weights   06/29/20 1308  Weight: 196 lb 12.8 oz (89.3 kg)    Physical Exam HENT:     Head: Normocephalic and atraumatic.     Mouth/Throat:     Pharynx: No oropharyngeal exudate.  Eyes:     Pupils: Pupils are equal, round, and reactive to light.  Cardiovascular:     Rate and Rhythm: Normal rate and regular rhythm.  Pulmonary:     Effort: No respiratory distress.     Breath sounds: No wheezing.  Abdominal:     General: Bowel sounds are normal. There is no distension.     Palpations: Abdomen is soft. There is no mass.     Tenderness: There is no abdominal tenderness. There is no guarding or rebound.  Musculoskeletal:        General: No tenderness. Normal range of motion.     Cervical back: Normal range of motion and neck supple.   Skin:    General: Skin is warm.  Neurological:     Mental Status: He is alert and oriented to person, place, and time.  Psychiatric:        Mood and Affect: Affect normal.       LABORATORY DATA:  I have reviewed the data as listed Lab Results  Component Value Date   WBC 6.4 06/29/2020   HGB 13.5 06/29/2020   HCT 41.2 06/29/2020   MCV 96.9 06/29/2020   PLT 200 06/29/2020   Recent Labs    01/13/20 1412 01/16/20 1305 03/16/20 1028 03/19/20 1256 04/19/20 1231 06/29/20 1300  NA 141 141  --  140 140 139  K 4.6 3.7  --  4.1 4.4 4.4  CL 102 103  --  104 102 102  CO2 26 30  --  28 28 30   GLUCOSE 92 110*  --  118* 118* 109*  BUN 18 24*  --  18 21 17   CREATININE 0.97 1.05   < > 1.06 1.03 0.99  CALCIUM 9.6 9.1  --  8.9 9.5 9.3  GFRNONAA 69 >60  --  >60 >60 >60  GFRAA 80 >60  --  >60  --   --   PROT  --   --   --  6.7  --   --   ALBUMIN  --   --   --  3.6  --   --   AST  --   --   --  25  --   --   ALT  --   --   --  25  --   --   ALKPHOS  --   --   --  63  --   --   BILITOT  --   --   --  1.3*  --   --    < > = values in this interval not displayed.    RADIOGRAPHIC STUDIES: I have personally reviewed the radiological images as listed and agreed with the findings in the report. No results found.  ASSESSMENT & PLAN:   Iron deficiency anemia due to chronic blood loss #Iron deficiency anemia secondary to chronic blood loss  [see below]-12/11/2019- Hb 8.5; ferritin 15 [PCP]-currently on p.o. iron.  S/p  IV Venofer; Today Hb-13.2. Hold Venofer.  # High-grade noninvasive urothelial malignancy/hematuria--status  post TURBT [facility]. No further hematuria. Defer to [facility] urology  # Omental/peritoneal thickening-unclear etiology; follow-up CT scan SEP 8th 2021--shows STABLE progressive omental/peritoneal thickening. Would recommend clinical surveillance with her imaging at this time.  # Lung nodule-/NISP- will repeat imaging in spring 2022. Discuss at next visit.  # DISPOSITION: #  NO venofer infusion today # follow up in 6 months  MD; labs; cbc/bmp; iron studies/ferritin;; Possible Venofer-Dr.B   All questions were answered. The patient knows to call the clinic with any problems, questions or concerns.    Cammie Sickle, MD 06/29/2020 2:12 PM

## 2020-07-30 ENCOUNTER — Other Ambulatory Visit: Payer: Self-pay | Admitting: Urology

## 2020-08-16 ENCOUNTER — Ambulatory Visit: Payer: Medicare HMO | Admitting: Dermatology

## 2020-08-16 ENCOUNTER — Other Ambulatory Visit: Payer: Self-pay

## 2020-08-16 DIAGNOSIS — L570 Actinic keratosis: Secondary | ICD-10-CM | POA: Diagnosis not present

## 2020-08-16 DIAGNOSIS — Z85828 Personal history of other malignant neoplasm of skin: Secondary | ICD-10-CM

## 2020-08-16 DIAGNOSIS — L821 Other seborrheic keratosis: Secondary | ICD-10-CM

## 2020-08-16 DIAGNOSIS — L578 Other skin changes due to chronic exposure to nonionizing radiation: Secondary | ICD-10-CM

## 2020-08-16 DIAGNOSIS — L820 Inflamed seborrheic keratosis: Secondary | ICD-10-CM

## 2020-08-16 NOTE — Progress Notes (Signed)
   Follow-Up Visit   Subjective  Christopher [name] is a 87 y.o. male who presents for the following: Actinic Keratosis (On the face - previously tx with LN2) and ISK (Back - patient does still have irritated skin lesions on the back that are itchy).  The following portions of the chart were reviewed this encounter and updated as appropriate:   Tobacco  Allergies  Meds  Problems  Med Hx  Surg Hx  Fam Hx     Review of Systems:  No other skin or systemic complaints except as noted in HPI or Assessment and Plan.  Objective  Well appearing patient in no apparent distress; mood and affect are within normal limits.  A focused examination was performed including the face, trunk, and extremities. Relevant physical exam findings are noted in the Assessment and Plan.  Objective  Back x (21): Erythematous keratotic or waxy stuck-on papule or plaque.   Objective  Hands x 5, face x 7, scalp x 1 (13): Erythematous thin papules/macules with gritty scale.   Assessment & Plan  Inflamed seborrheic keratosis (21) Back x  Destruction of lesion - Back x Complexity: simple   Destruction method: cryotherapy   Informed consent: discussed and consent obtained   Timeout:  patient name, date of birth, surgical site, and procedure verified Lesion destroyed using liquid nitrogen: Yes   Region frozen until ice ball extended beyond lesion: Yes   Outcome: patient tolerated procedure well with no complications   Post-procedure details: wound care instructions given    AK (actinic keratosis) (13) Hands x 5, face x 7, scalp x 1  Destruction of lesion - Hands x 5, face x 7, scalp x 1 Complexity: simple   Destruction method: cryotherapy   Informed consent: discussed and consent obtained   Timeout:  patient name, date of birth, surgical site, and procedure verified Lesion destroyed using liquid nitrogen: Yes   Region frozen until ice ball extended beyond lesion: Yes   Outcome: patient tolerated  procedure well with no complications   Post-procedure details: wound care instructions given     Actinic Damage - chronic, secondary to cumulative UV radiation exposure/sun exposure over time - diffuse scaly erythematous macules with underlying dyspigmentation - Recommend daily broad spectrum sunscreen SPF 30+ to sun-exposed areas, reapply every 2 hours as needed.  - Call for new or changing lesions.  Seborrheic Keratoses - Stuck-on, waxy, tan-brown papules and plaques  - Discussed benign etiology and prognosis. - Observe - Call for any changes  History of Squamous Cell Carcinoma of the Skin - No evidence of recurrence today - No lymphadenopathy - Recommend regular full body skin exams - Recommend daily broad spectrum sunscreen SPF 30+ to sun-exposed areas, reapply every 2 hours as needed.  - Call if any new or changing lesions are noted between office visits  Return in about 6 months (around 02/13/2021) for AK and ISK follow up .  Luther Redo, CMA, am acting as scribe for Sarina Ser, MD .  Documentation: I have reviewed the above documentation for accuracy and completeness, and I agree with the above.  Sarina Ser, MD

## 2020-08-19 ENCOUNTER — Encounter: Payer: Self-pay | Admitting: Dermatology

## 2020-08-27 ENCOUNTER — Other Ambulatory Visit: Payer: Self-pay | Admitting: Neurosurgery

## 2020-09-22 ENCOUNTER — Other Ambulatory Visit: Payer: Self-pay

## 2020-09-22 NOTE — Telephone Encounter (Signed)
Patient called and left a vmail on the triage line stating his Alfuzosin script has not been filled by his [place] and they need [place] to call at [phone].  The number was called and revealed that it was [facility] not [facility] order pharmacy. Spoke w/ Joy and was transferred to the pharmacist Simona Huh, verbal order given to refill alfuzosin 90 day w/3 refills.  [employer] mail order was taken out of patient's chart and pharmacy was [facility] Patient was notified of this.

## 2020-10-01 ENCOUNTER — Other Ambulatory Visit: Payer: Self-pay

## 2020-10-01 ENCOUNTER — Encounter
Admission: RE | Admit: 2020-10-01 | Discharge: 2020-10-01 | Disposition: A | Discharge status: Home / Self Care | Payer: Medicare HMO | Source: Ambulatory Visit | Attending: Neurosurgery | Admitting: Neurosurgery

## 2020-10-01 DIAGNOSIS — Z950 Presence of cardiac pacemaker: Secondary | ICD-10-CM | POA: Diagnosis not present

## 2020-10-01 DIAGNOSIS — Z01818 Encounter for other preprocedural examination: Secondary | ICD-10-CM | POA: Diagnosis not present

## 2020-10-01 DIAGNOSIS — I10 Essential (primary) hypertension: Secondary | ICD-10-CM | POA: Diagnosis not present

## 2020-10-01 DIAGNOSIS — I4891 Unspecified atrial fibrillation: Secondary | ICD-10-CM | POA: Diagnosis not present

## 2020-10-01 HISTORY — DX: Unspecified osteoarthritis, unspecified site: M19.90

## 2020-10-01 HISTORY — DX: Malignant neoplasm of bladder, unspecified: C67.9

## 2020-10-01 HISTORY — DX: Unspecified atrial fibrillation: I48.91

## 2020-10-01 HISTORY — DX: Anemia, unspecified: D64.9

## 2020-10-01 LAB — URINALYSIS, ROUTINE W REFLEX MICROSCOPIC
Bilirubin Urine: NEGATIVE
Glucose, UA: NEGATIVE mg/dL
Ketones, ur: NEGATIVE mg/dL
Nitrite: NEGATIVE
Protein, ur: 100 mg/dL — AB
RBC / HPF: >50 RBC/hpf — ABNORMAL HIGH (ref 0–5)
Specific Gravity, Urine: 1.016 (ref 1.005–1.030)
Squamous Epithelial / HPF: NONE SEEN (ref 0–5)
WBC, UA: >50 WBC/hpf — ABNORMAL HIGH (ref 0–5)
pH: 7 (ref 5.0–8.0)

## 2020-10-01 LAB — CBC
HCT: 40.3 % (ref 39.0–52.0)
Hemoglobin: 13.1 g/dL (ref 13.0–17.0)
MCH: 32.2 pg (ref 26.0–34.0)
MCHC: 32.5 g/dL (ref 30.0–36.0)
MCV: 99 fL (ref 80.0–100.0)
Platelets: 196 10*3/uL (ref 150–400)
RBC: 4.07 MIL/uL — ABNORMAL LOW (ref 4.22–5.81)
RDW: 14.5 % (ref 11.5–15.5)
WBC: 4.9 10*3/uL (ref 4.0–10.5)
nRBC: 0 % (ref 0.0–0.2)

## 2020-10-01 LAB — TYPE AND SCREEN
ABO/RH(D): A POS
Antibody Screen: NEGATIVE

## 2020-10-01 LAB — PROTIME-INR
INR: 1.3 — ABNORMAL HIGH (ref 0.8–1.2)
Prothrombin Time: 15.5 seconds — ABNORMAL HIGH (ref 11.4–15.2)

## 2020-10-01 LAB — BASIC METABOLIC PANEL
Anion gap: 7 (ref 5–15)
BUN: 20 mg/dL (ref 8–23)
CO2: 29 mmol/L (ref 22–32)
Calcium: 9 mg/dL (ref 8.9–10.3)
Chloride: 104 mmol/L (ref 98–111)
Creatinine, Ser: 1.04 mg/dL (ref 0.61–1.24)
GFR, Estimated: >60 mL/min (ref >60)
Glucose, Bld: 108 mg/dL — ABNORMAL HIGH (ref 70–99)
Potassium: 4 mmol/L (ref 3.5–5.1)
Sodium: 140 mmol/L (ref 135–145)

## 2020-10-01 LAB — SURGICAL PCR SCREEN
MRSA, PCR: NEGATIVE
Staphylococcus aureus: NEGATIVE

## 2020-10-01 LAB — APTT: aPTT: 34 seconds (ref 24–36)

## 2020-10-01 NOTE — Patient Instructions (Addendum)
Your procedure is scheduled on:10-11-20  (Monday) 10-18-20 (Monday) Report to the Registration Desk on the 1st floor of the [facility]-Then proceed to the 2nd floor Surgery Desk in the [place] To find out your arrival time, please call [phone] between 1PM - 3PM on:10-08-20 (Friday)  10-15-20 (Friday)  REMEMBER: Instructions that are not followed completely may result in serious medical risk, up to and including death; or upon the discretion of your surgeon and anesthesiologist your surgery may need to be rescheduled.  Do not eat food after midnight the night before surgery.  No gum chewing, lozengers or hard candies.  You may however, drink CLEAR liquids up to 2 hours before you are scheduled to arrive for your surgery. Do not drink anything within 2 hours of your scheduled arrival time.  Clear liquids include: - water  - apple juice without pulp - gatorade  - black coffee or tea (Do NOT add milk or creamers to the coffee or tea) Do NOT drink anything that is not on this list.  TAKE THESE MEDICATIONS THE MORNING OF SURGERY WITH A SIP OF WATER: -UROXATRAL (ALFUZOSIN) -LIPITOR (ATORVASTATIN) -PROSCAR (FINASTERIDE) -FLOMAX (TAMSULOSIN) -METOPROLOL (TOPROL)  Follow recommendations from Cardiologist, Pulmonologist or PCP regarding stopping Aspirin, Coumadin, Plavix, Eliquis, Pradaxa, or Pletal-STOP ELIQUIS 3 DAYS PRIOR TO SURGERY PER DR COOK-LAST DOSE WILL BE ON Thursday 10-07-20 -CALL DR COOK'S OFFICE TODAY 10-01-20 ABOUT WHEN TO STOP YOUR 81 MG ASPRIN  One week prior to surgery: Stop Anti-inflammatories (NSAIDS) such as Advil, Aleve, Ibuprofen, Motrin, Naproxen, Naprosyn and Aspirin based products such as Excedrin, Goodys Powder, BC Powder.-OK TO CONTINUE TYLENOL IF NEEDED  Stop ANY OVER THE COUNTER supplements until after surgery-STOP FISH OIL AND PRESERVISION 7 DAYS PRIOR TO SURGERY-HOWEVER, YOU MAY CONTINUE YOUR MULTIVITAMIN, CALCIUM AND VITAMIN D UP UNTIL THE DAY BEFORE YOUR  SURGERY  No Alcohol for 24 hours before or after surgery.  No Smoking including e-cigarettes for 24 hours prior to surgery.  No chewable tobacco products for at least 6 hours prior to surgery.  No nicotine patches on the day of surgery.  Do not use any "recreational" drugs for at least a week prior to your surgery.  Please be advised that the combination of cocaine and anesthesia may have negative outcomes, up to and including death. If you test positive for cocaine, your surgery will be cancelled.  On the morning of surgery brush your teeth with toothpaste and water, you may rinse your mouth with mouthwash if you wish. Do not swallow any toothpaste or mouthwash.  Do not wear jewelry, make-up, hairpins, clips or nail polish.  Do not wear lotions, powders, or perfumes.   Do not shave body from the neck down 48 hours prior to surgery just in case you cut yourself which could leave a site for infection.  Also, freshly shaved skin may become irritated if using the CHG soap.  Contact lenses, hearing aids and dentures may not be worn into surgery.  Do not bring valuables to the hospital. [facility] is not responsible for any missing/lost belongings or valuables.   Use CHG Soap as directed on instruction sheet   Notify your doctor if there is any change in your medical condition (cold, fever, infection).  Wear comfortable clothing (specific to your surgery type) to the hospital.  Plan for stool softeners for home use; pain medications have a tendency to cause constipation. You can also help prevent constipation by eating foods high in fiber such as fruits and  vegetables and drinking plenty of fluids as your diet allows.  After surgery, you can help prevent lung complications by doing breathing exercises.  Take deep breaths and cough every 1-2 hours. Your doctor may order a device called an Incentive Spirometer to help you take deep breaths. When coughing or sneezing, hold a pillow  firmly against your incision with both hands. This is called "splinting." Doing this helps protect your incision. It also decreases belly discomfort.  If you are being admitted to the hospital overnight, leave your suitcase in the car. After surgery it may be brought to your room.  If you are being discharged the day of surgery, you will not be allowed to drive home. You will need a responsible adult (18 years or older) to drive you home and stay with you that night.   If you are taking public transportation, you will need to have a responsible adult (18 years or older) with you. Please confirm with your physician that it is acceptable to use public transportation.   Please call the [facility] at [phone] if you have any questions about these instructions.  Surgery Visitation Policy:  Patients undergoing a surgery or procedure may have one family member or support person with them as long as that person is not COVID-19 positive or experiencing its symptoms.  That person may remain in the waiting area during the procedure.  Inpatient Visitation:    Visiting hours are 7 a.m. to 8 p.m. Inpatients will be allowed two visitors daily. The visitors may change each day during the patient's stay. No visitors under the age of 69. Any visitor under the age of 35 must be accompanied by an adult. The visitor must pass COVID-19 screenings, use hand sanitizer when entering and exiting the patient's room and wear a mask at all times, including in the patient's room. Patients must also wear a mask when staff or their visitor are in the room. Masking is required regardless of vaccination status.

## 2020-10-02 LAB — URINE CULTURE: Culture: NO GROWTH

## 2020-10-05 NOTE — Progress Notes (Signed)
Perioperative Services  Pre-Admission/Anesthesia Testing Clinical Review  Date: 10/05/20  Patient Demographics:  Name: Christopher [name] DOB:   10/12/1927 MRN:   [id number]  Planned Surgical Procedure(s):    Case: [id number] Date/Time: 10/18/20 0700   Procedure: UNILATERAL PULSE GENERATOR IMPLANT (N/A )   Anesthesia type: Monitor Anesthesia Care   Pre-op diagnosis: chronic pain syndrome g89.4   Location: [facility] OR ROOM 03 / [place] [facility]   Surgeons: [name], MD    NOTE: Available PAT nursing documentation and vital signs have been reviewed. Clinical nursing staff has updated patient's PMH/PSHx, current medication list, and drug allergies/intolerances to ensure comprehensive history available to assist in medical decision making as it pertains to the aforementioned surgical procedure and anticipated anesthetic course.   Clinical Discussion:  Christopher [name] is a 87 y.o. male who is submitted for pre-surgical anesthesia review and clearance prior to him undergoing the above procedure. Patient has never been a smoker. Pertinent PMH includes: A. fib, symptomatic bradycardia (s/p PPM placement), HTN, HLD, urothelial carcinoma, IDA, Gilbert's disease, lumbar DDD.  Patient is followed by cardiology Ubaldo Glassing, MD). He was last seen in the cardiology clinic on 09/06/2020; notes reviewed.  At the time of his clinic visit, patient was doing fairly well from cardiac standpoint.  History of high-grade heart block status post dual-chamber PPM placement. Patient denied any chest pain, however endorsed chronic shortness breath with occasional wheezing.  Patient recently sent for pulmonary function testing to further assess his shortness of breath. PFTs performed on 11/19/2019 revealed borderline normal lung volumes with a TLC of 72% with preserved spirometry and DLCO (see full results below).  Exam revealed no peripheral edema. Patient denies PND, orthopnea, palpitation, or presyncope/syncope.   Patient was on GMT for his HTN and HLD diagnoses. Blood pressure well controlled at 118/72 on currently prescribed beta-blocker monotherapy. Patient is on statin for his HLD. CHA2DS2-VASc Score = 3. Patient chronically anticoagulated using apixaban and daily low-dose ASA therapy.  Myocardial perfusion imaging performed in 02/2019 revealed an LVEF of 51% with no RWMAs. TTE also done in 02/2019 revealed normal LV systolic function with mild LVH; LVEF 55% (see full interpretation of cardiovascular testing below).  No changes were made to patient's medication regimen.  Patient to follow-up with outpatient cardiology in 3 months or sooner if needed.  Patient scheduled to undergo unilateral pulse generator implant on 10/18/2020 with Dr. [name].  Given patient's age and past medical history significant for cardiovascular diagnoses, presurgical cardiac clearance was sought by PAT team.  Per cardiology, "this patient is optimized for surgery and may proceed with the planned procedural course with a LOW risk stratification".  Again, this patient is on daily anticoagulation therapy.  He has been instructed on recommendations of cardiology for holding his daily apixaban dose for 3 days prior to his procedure.  Patient has been instructed his last dose of apixaban will be on 10/07/2020.  He denies previous perioperative complications with anesthesia. He underwent a general anesthetic course here (ASA III) in 01/2020 with no documented complications.   Vitals with BMI 10/01/2020 06/29/2020 05/03/2020  Height 5\' 11"  5\' 11"  5\' 11"   Weight 201 lbs 8 oz 196 lbs 13 oz 195 lbs  BMI 28.12 32.99 24.26  Systolic 834 196 222  Diastolic 69 69 72  Pulse 63 64 73    Providers/Specialists:   NOTE: Primary physician provider listed below. Patient may have been seen by APP or partner within same practice.   PROVIDER  ROLE / SPECIALTY LAST Edsel Petrin, MD Neurosurgery  08/03/2020  Ezequiel Kayser, MD Primary Care  Provider  07/29/2020  Bartholome Bill, MD Cardiology  09/06/2020  Charlaine Dalton, MD Oncology  06/29/2020   Allergies:  Atenolol  Current Home Medications:   No current facility-administered medications for this encounter.   Marland Kitchen alfuzosin (UROXATRAL) 10 MG 24 hr tablet  . apixaban (ELIQUIS) 5 MG TABS tablet  . aspirin EC 81 MG tablet  . atorvastatin (LIPITOR) 10 MG tablet  . calcium carbonate (OSCAL) 1500 (600 Ca) MG TABS tablet  . Cholecalciferol (VITAMIN D3) 50 MCG (2000 UT) TABS  . ferrous sulfate 325 (65 FE) MG tablet  . finasteride (PROSCAR) 5 MG tablet  . metoprolol succinate (TOPROL-XL) 25 MG 24 hr tablet  . metoprolol succinate (TOPROL-XL) 50 MG 24 hr tablet  . Multiple Vitamin (MULTIVITAMIN WITH MINERALS) TABS tablet  . Multiple Vitamins-Minerals (PRESERVISION AREDS 2 PO)  . Omega-3 Fatty Acids (FISH OIL) 1200 MG CAPS  . Polyethyl Glycol-Propyl Glycol (LUBRICANT EYE DROPS) 0.4-0.3 % SOLN  . tamsulosin (FLOMAX) 0.4 MG CAPS capsule   History:   Past Medical History:  Diagnosis Date  . A-fib ([place])   . Anemia   . Arthritis    BACK  . Bladder cancer ([place])   . BPH (benign prostatic hyperplasia)   . Cancer ([place])    skin cancer on face carcinoma  . ED (erectile dysfunction)   . Gilbert disease   . HLD (hyperlipidemia)   . Hypertension   . Macular degeneration   . Mollusca contagiosa   . Osteopenia   . Presence of permanent cardiac pacemaker   . Sensorineural hearing loss   . Squamous cell carcinoma of skin 08/25/2019   R cheek   . Squamous cell carcinoma of skin 02/12/2020   L neck    Past Surgical History:  Procedure Laterality Date  . CATARACT EXTRACTION Bilateral   . CYSTOSCOPY WITH BIOPSY N/A 01/20/2020   Procedure: CYSTOSCOPY WITH BIOPSY;  Surgeon: Abbie Sons, MD;  Location: [facility] ORS;  Service: Urology;  Laterality: N/A;  . CYSTOSCOPY WITH FULGERATION N/A 01/20/2020   Procedure: CYSTOSCOPY WITH FULGERATION;  Surgeon: Abbie Sons, MD;   Location: [facility] ORS;  Service: Urology;  Laterality: N/A;  . CYSTOSCOPY WITH FULGERATION N/A 04/19/2020   Procedure: [place] with clot evacuation;  Surgeon: Abbie Sons, MD;  Location: [facility] ORS;  Service: Urology;  Laterality: N/A;  . HEMORRHOIDECTOMY WITH HEMORRHOID BANDING    . KNEE ARTHROSCOPY Left   . PACEMAKER INSERTION N/A 03/03/2015   Procedure: INSERTION DUAL LEAD PACEMAKER;  Surgeon: Isaias Cowman, MD;  Location: [facility] ORS;  Service: Cardiovascular;  Laterality: N/A;  . TONSILLECTOMY    . TRANSURETHRAL RESECTION OF BLADDER TUMOR N/A 10/07/2019   Procedure: TRANSURETHRAL RESECTION OF BLADDER TUMOR (TURBT);  Surgeon: Abbie Sons, MD;  Location: [facility] ORS;  Service: Urology;  Laterality: N/A;  . TRANSURETHRAL RESECTION OF BLADDER TUMOR N/A 04/13/2020   Procedure: TRANSURETHRAL RESECTION OF BLADDER TUMOR (TURBT);  Surgeon: Abbie Sons, MD;  Location: [facility] ORS;  Service: Urology;  Laterality: N/A;   Family History  Problem Relation Age of Onset  . Diabetes Mother   . Lung cancer Father   . Cancer Paternal Uncle   . Prostate cancer Neg Hx   . Bladder Cancer Neg Hx   . Kidney cancer Neg Hx    Social History   Tobacco Use  . Smoking status: Never Smoker  .  Smokeless tobacco: Never Used  Vaping Use  . Vaping Use: Never used  Substance Use Topics  . Alcohol use: Yes    Alcohol/week: 7.0 standard drinks    Types: 7 Glasses of wine per week    Comment: DAILY   . Drug use: No    Pertinent Clinical Results:  LABS: Labs reviewed: Acceptable for surgery.  No visits with results within 3 Day(s) from this visit.  Latest known visit with results is:  Hospital Outpatient Visit on 10/01/2020  Component Date Value Ref Range Status  . aPTT 10/01/2020 34  24 - 36 seconds Final   Performed at [facility], [address] [place], [place] [address]  . Sodium 10/01/2020 140  135 - 145 mmol/L Final  . Potassium 10/01/2020 4.0  3.5 - 5.1 mmol/L  Final  . Chloride 10/01/2020 104  98 - 111 mmol/L Final  . CO2 10/01/2020 29  22 - 32 mmol/L Final  . Glucose, Bld 10/01/2020 108* 70 - 99 mg/dL Final   Glucose reference range applies only to samples taken after fasting for at least 8 hours.  . BUN 10/01/2020 20  8 - 23 mg/dL Final  . Creatinine, Ser 10/01/2020 1.04  0.61 - 1.24 mg/dL Final  . Calcium 10/01/2020 9.0  8.9 - 10.3 mg/dL Final  . GFR, Estimated 10/01/2020 >60  >60 mL/min Final   Comment: (NOTE) Calculated using the CKD-EPI Creatinine Equation (2021)   . Anion gap 10/01/2020 7  5 - 15 Final   Performed at [facility], [address] [place], [place] [address]  . WBC 10/01/2020 4.9  4.0 - 10.5 K/uL Final  . RBC 10/01/2020 4.07* 4.22 - 5.81 MIL/uL Final  . Hemoglobin 10/01/2020 13.1  13.0 - 17.0 g/dL Final  . HCT 10/01/2020 40.3  39.0 - 52.0 % Final  . MCV 10/01/2020 99.0  80.0 - 100.0 fL Final  . MCH 10/01/2020 32.2  26.0 - 34.0 pg Final  . MCHC 10/01/2020 32.5  30.0 - 36.0 g/dL Final  . RDW 10/01/2020 14.5  11.5 - 15.5 % Final  . Platelets 10/01/2020 196  150 - 400 K/uL Final  . nRBC 10/01/2020 0.0  0.0 - 0.2 % Final   Performed at [facility], [address], [place], [place] [address]  . Prothrombin Time 10/01/2020 15.5* 11.4 - 15.2 seconds Final  . INR 10/01/2020 1.3* 0.8 - 1.2 Final   Comment: (NOTE) INR goal varies based on device and disease states. Performed at [facility], [address], [place], [place] [address]   . MRSA, PCR 10/01/2020 NEGATIVE  NEGATIVE Final  . Staphylococcus aureus 10/01/2020 NEGATIVE  NEGATIVE Final   Comment: (NOTE) The Xpert SA Assay (FDA approved for NASAL specimens in patients 40 years of age and older), is one component of a comprehensive surveillance program. It is not intended to diagnose infection nor to guide or monitor treatment. Performed at [facility], [address] [place], [place] [address]   . ABO/RH(D) 10/01/2020 A  POS   Final  . Antibody Screen 10/01/2020 NEG   Final  . Sample Expiration 10/01/2020 10/15/2020,2359   Final  . Extend sample reason 10/01/2020    Final                   Value:NO TRANSFUSIONS OR PREGNANCY IN THE PAST 3 MONTHS Performed at [facility], [address] [place], [place] [address]   . Color, Urine 10/01/2020 YELLOW* YELLOW  Final  . APPearance 10/01/2020 CLOUDY* CLEAR Final  . Specific Gravity, Urine 10/01/2020 1.016  1.005 - 1.030 Final  . pH 10/01/2020 7.0  5.0 - 8.0 Final  . Glucose, UA 10/01/2020 NEGATIVE  NEGATIVE mg/dL Final  . Hgb urine dipstick 10/01/2020 LARGE* NEGATIVE Final  . Bilirubin Urine 10/01/2020 NEGATIVE  NEGATIVE Final  . Ketones, ur 10/01/2020 NEGATIVE  NEGATIVE mg/dL Final  . Protein, ur 10/01/2020 100* NEGATIVE mg/dL Final  . Nitrite 10/01/2020 NEGATIVE  NEGATIVE Final  . Leukocytes,Ua 10/01/2020 TRACE* NEGATIVE Final  . RBC / HPF 10/01/2020 >50* 0 - 5 RBC/hpf Final  . WBC, UA 10/01/2020 >50* 0 - 5 WBC/hpf Final  . Bacteria, UA 10/01/2020 RARE* NONE SEEN Final  . Squamous Epithelial / LPF 10/01/2020 NONE SEEN  0 - 5 Final  . Mucus 10/01/2020 PRESENT   Final   Performed at [facility], [address] [place], [place] [address]    ECG: Date: 10/01/2020 Time ECG obtained: 1107 AM Rate: 65 bpm Rhythm: Atrial paced with prolonged AV conduction Axis (leads I and aVF): Normal Intervals: PR 228 ms. QRS 90 ms. QTc 418 ms. ST segment and T wave changes: No evidence of acute ST segment elevation or depression Comparison: Similar to previous tracing obtained on 10/07/2019   IMAGING / PROCEDURES: MRI THORACIC SPINE WITHOUT CONTRAST performed on 05/04/2020 1. Chronic compression fracture of T11 with loss of approximately 70% of vertebral body height. 2. At T8-9, a small posterior disc protrusion without significant spinal canal or neural foraminal stenosis. 3. At T11-12, a small posterior disc protrusion without significant spinal  canal or neural foraminal stenosis. 4. No marrow edema or retropulsion. 5. Mild degenerative disc disease of the thoracic spine.  6. No significant spinal canal or neural foraminal stenosis at any level.  PULMONARY FUNCTION TESTING done on 11/19/2019 1. Borderline normal lung volumes with TLC at 72%, however preserved spirometry and DLCO. These findings may be seen in early interstitial lung disease.  2. SPIROMETRY:  FVC was 2.71 liters, 86% of predicted  FEV1 was 2.03, 85% of predicted  FEV1 ratio was 75  FEF 25-75% liters per second was 68% of predicted 3. LUNG VOLUMES:  TLC was 72% of predicted  RV was 52% of predicted 4. DIFFUSION CAPACITY:  DLCO was 91% of predicted  DLCO/VA was 113% of predicted 5. FLOW VOLUME LOOP:  Restricted shape of FVL   LEXISCAN done on 03/04/2019 1. LVEF 51% 2. LV function good. 3. Regional wall motion reveals normal myocardial thickening and wall motion 4. No artifact noted 5. Left ventricular cavity normal 6. No evidence of reversible ischemia 7. Low risk study  ECHOCARDIOGRAM done on 03/03/2019 1. LVEF 55% 2. Normal LV systolic function with mild LVH 3. Normal RV systolic function 4. Mild AR and TR; trivial MR 5. No valvular stenosis   Impression and Plan:  Christopher [name] has been referred for pre-anesthesia review and clearance prior to him undergoing the planned anesthetic and procedural courses. Available labs, pertinent testing, and imaging results were personally reviewed by me. This patient has been appropriately cleared by cardiology with an overall LOW risk of significant perioperative cardiovascular complications.  Perioperative prescription for cardiac device programming form completed by primary attending cardiologist and placed on patient's chart for review by the surgical/anesthetic team.  Based on clinical review performed today (10/05/20), barring any significant acute changes in the patient's overall condition, it  is anticipated that he will be able to proceed with the planned  surgical intervention. Any acute changes in clinical condition may necessitate his procedure being postponed and/or cancelled. Pre-surgical instructions were reviewed with the patient during his PAT appointment and questions were fielded by PAT clinical staff.  Honor Loh, MSN, APRN, FNP-C, CEN [facility]  Peri-operative Services Nurse Practitioner Phone: [phone] 10/05/20 11:09 AM  NOTE: This note has been prepared using Dragon dictation software. Despite my best ability to proofread, there is always the potential that unintentional transcriptional errors may still occur from this process.

## 2020-10-07 ENCOUNTER — Other Ambulatory Visit
Admission: RE | Admit: 2020-10-07 | Discharge: 2020-10-07 | Disposition: A | Discharge status: Home / Self Care | Payer: Medicare HMO | Source: Ambulatory Visit | Attending: Neurosurgery | Admitting: Neurosurgery

## 2020-10-07 ENCOUNTER — Other Ambulatory Visit: Payer: Self-pay

## 2020-10-07 DIAGNOSIS — Z20822 Contact with and (suspected) exposure to covid-19: Secondary | ICD-10-CM | POA: Diagnosis not present

## 2020-10-07 DIAGNOSIS — Z01812 Encounter for preprocedural laboratory examination: Secondary | ICD-10-CM | POA: Insufficient documentation

## 2020-10-07 LAB — SARS CORONAVIRUS 2 (TAT 6-24 HRS): SARS Coronavirus 2: NEGATIVE

## 2020-10-10 MED ORDER — LACTATED RINGERS IV SOLN: INTRAVENOUS | Status: DC

## 2020-10-10 MED ORDER — CHLORHEXIDINE GLUCONATE 0.12 % MT SOLN: 15.0000 mL | Freq: Once | OROMUCOSAL | Status: AC

## 2020-10-10 MED ORDER — ORAL CARE MOUTH RINSE: 15.0000 mL | Freq: Once | OROMUCOSAL | Status: AC

## 2020-10-10 MED ORDER — CEFAZOLIN SODIUM-DEXTROSE 2-4 GM/100ML-% IV SOLN
2.0000 g | INTRAVENOUS | Status: AC
  Administered 2020-10-11: 2 g via INTRAVENOUS

## 2020-10-10 MED ORDER — FAMOTIDINE 20 MG PO TABS: 20.0000 mg | ORAL_TABLET | Freq: Once | ORAL | Status: AC

## 2020-10-10 NOTE — Progress Notes (Signed)
Pharmacy Antibiotic Note  Christopher [name] is a 87 y.o. male admitted on (Not on file) with surgical prophylaxis.  Pharmacy has been consulted for cefazolin dosing.  TBW = 91.4 kg   Plan: Cefazolin 2 gm IV X 1 60 min pre-op      No data recorded.  No results for input(s): WBC, CREATININE, LATICACIDVEN, VANCOTROUGH, VANCOPEAK, VANCORANDOM, GENTTROUGH, GENTPEAK, GENTRANDOM, TOBRATROUGH, TOBRAPEAK, TOBRARND, AMIKACINPEAK, AMIKACINTROU, AMIKACIN in the last 168 hours.  Estimated Creatinine Clearance: 54.6 mL/min (by C-G formula based on SCr of 1.04 mg/dL).    Allergies  Allergen Reactions  . Atenolol     Happened a long time ago and can't recall    Antimicrobials this admission:   >>    >>   Dose adjustments this admission:   Microbiology results:  BCx:   UCx:    Sputum:    MRSA PCR:   Thank you for allowing pharmacy to be a part of this patient's care.  Haron Beilke D 10/10/2020 11:13 PM

## 2020-10-11 ENCOUNTER — Ambulatory Visit: Payer: Medicare HMO | Admitting: Anesthesiology

## 2020-10-11 ENCOUNTER — Other Ambulatory Visit: Payer: Self-pay

## 2020-10-11 ENCOUNTER — Ambulatory Visit
Admission: RE | Admit: 2020-10-11 | Discharge: 2020-10-11 | Disposition: A | Discharge status: Home / Self Care | Payer: Medicare HMO | Attending: Neurosurgery | Admitting: Neurosurgery

## 2020-10-11 ENCOUNTER — Encounter: Payer: Self-pay | Admitting: Neurosurgery

## 2020-10-11 ENCOUNTER — Encounter
Admission: RE | Disposition: A | Discharge status: Home / Self Care | Payer: Self-pay | Source: Home / Self Care | Attending: Neurosurgery

## 2020-10-11 ENCOUNTER — Ambulatory Visit: Payer: Medicare HMO

## 2020-10-11 DIAGNOSIS — Z85828 Personal history of other malignant neoplasm of skin: Secondary | ICD-10-CM | POA: Diagnosis not present

## 2020-10-11 DIAGNOSIS — M549 Dorsalgia, unspecified: Secondary | ICD-10-CM | POA: Diagnosis not present

## 2020-10-11 DIAGNOSIS — C679 Malignant neoplasm of bladder, unspecified: Secondary | ICD-10-CM | POA: Diagnosis not present

## 2020-10-11 DIAGNOSIS — Z7982 Long term (current) use of aspirin: Secondary | ICD-10-CM | POA: Insufficient documentation

## 2020-10-11 DIAGNOSIS — G894 Chronic pain syndrome: Secondary | ICD-10-CM | POA: Insufficient documentation

## 2020-10-11 DIAGNOSIS — Z7901 Long term (current) use of anticoagulants: Secondary | ICD-10-CM | POA: Insufficient documentation

## 2020-10-11 DIAGNOSIS — Z950 Presence of cardiac pacemaker: Secondary | ICD-10-CM | POA: Diagnosis not present

## 2020-10-11 DIAGNOSIS — M79605 Pain in left leg: Secondary | ICD-10-CM | POA: Insufficient documentation

## 2020-10-11 DIAGNOSIS — Z79899 Other long term (current) drug therapy: Secondary | ICD-10-CM | POA: Insufficient documentation

## 2020-10-11 DIAGNOSIS — Z419 Encounter for procedure for purposes other than remedying health state, unspecified: Secondary | ICD-10-CM

## 2020-10-11 HISTORY — PX: SPINAL CORD STIMULATOR TRIAL: SHX5380

## 2020-10-11 IMAGING — RF DG C-ARM 1-60 MIN
1 series · 15 of 23 positions shown · non-contrast
Comparison: None

FLUOROSCOPY TIME:  2 minutes 48 seconds

Dose: 79.62 mGy

Images: 21

CLINICAL DATA: Intraspinal stimulator placement intraoperatively

EXAM:
LUMBAR SPINE - 2-3 VIEW; DG C-ARM 1-60 MIN

[Series 1: dg x-ray · 0.20mm/px · 15 of 23 slices shown]
[im 1/23]
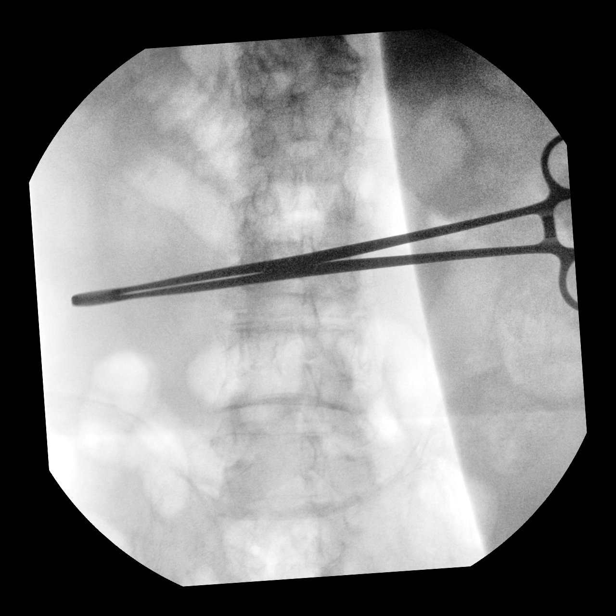
[im 3/23]
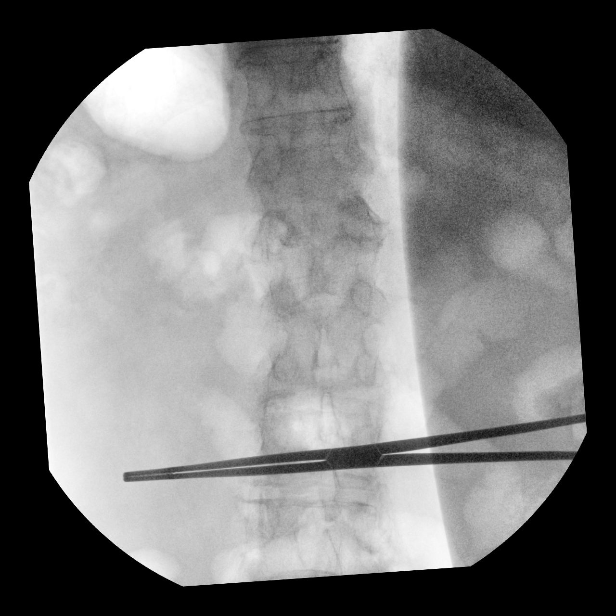
[im 4/23]
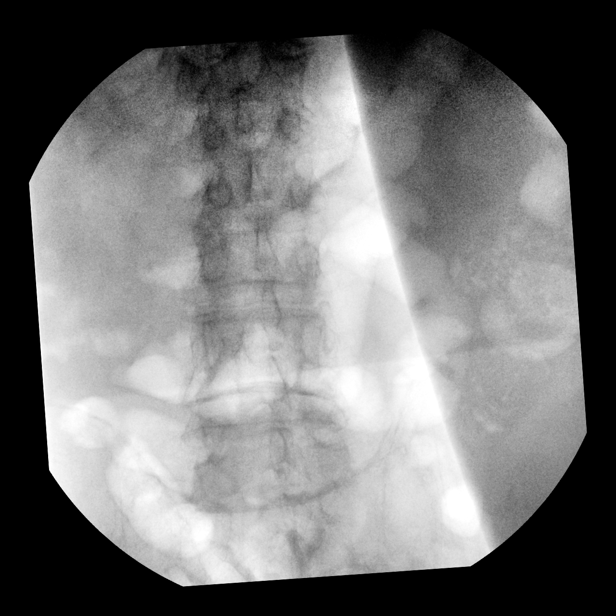
[im 6/23]
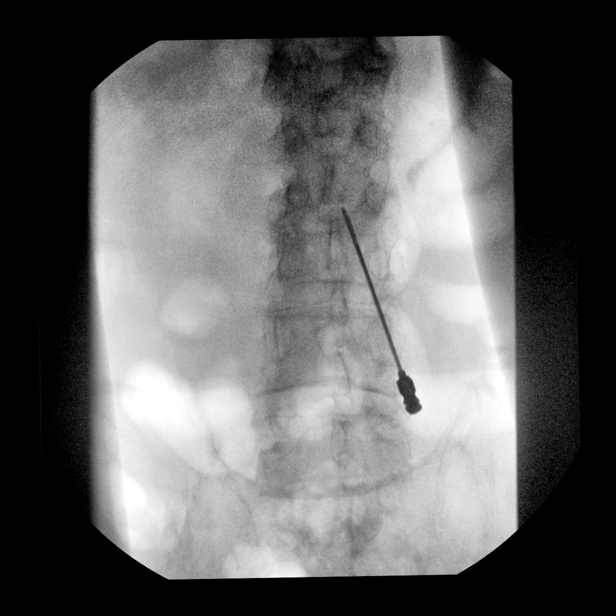
[im 7/23]
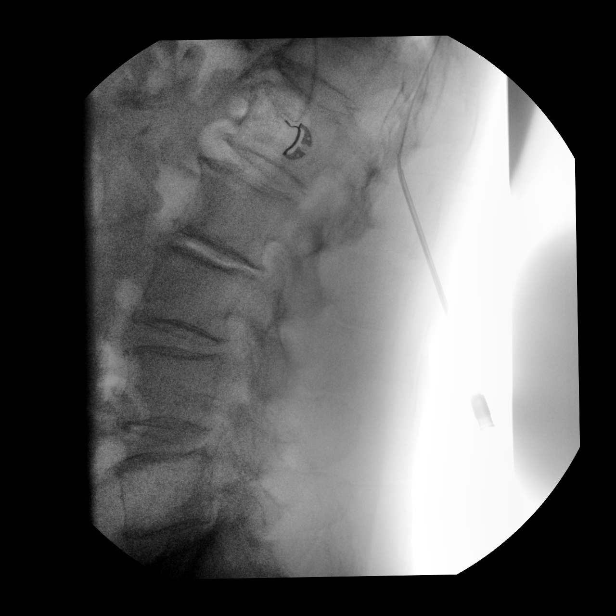
[im 9/23]
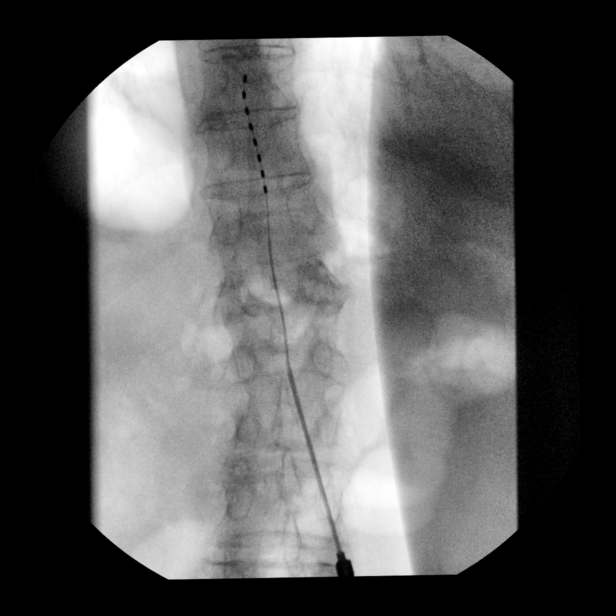
[im 10/23]
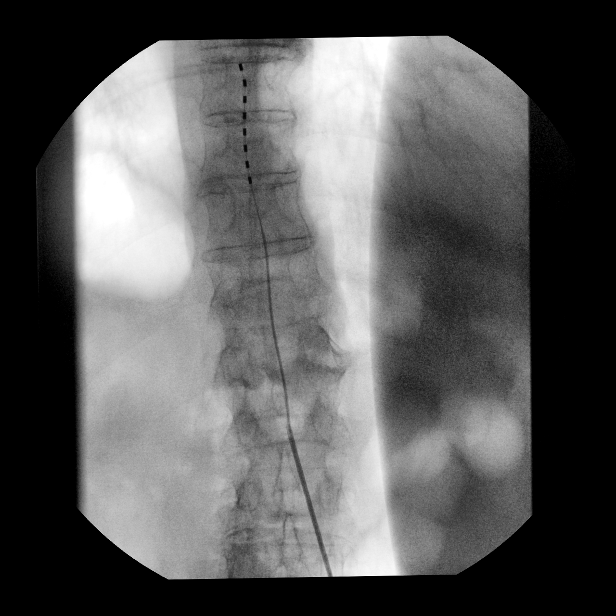
[im 12/23]
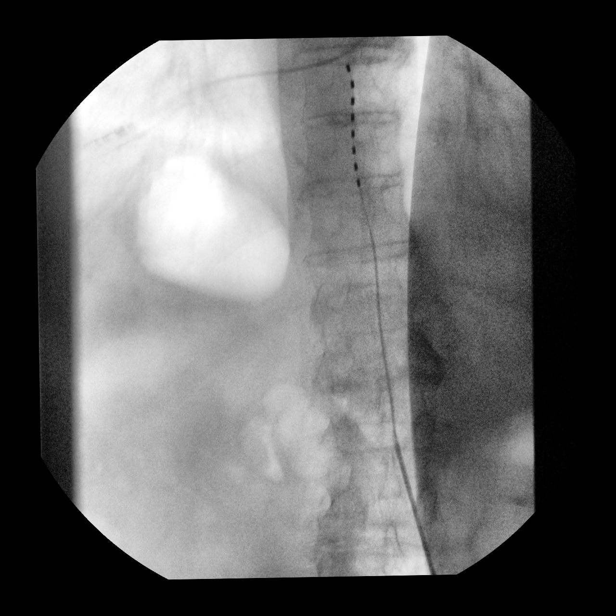
[im 14/23]
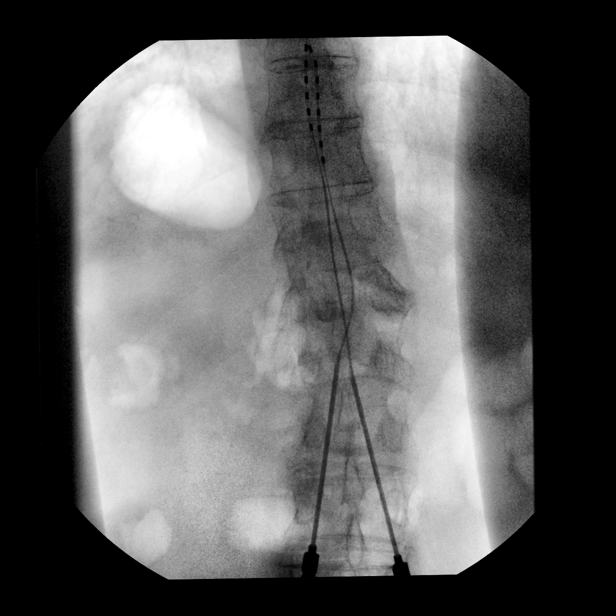
[im 15/23]
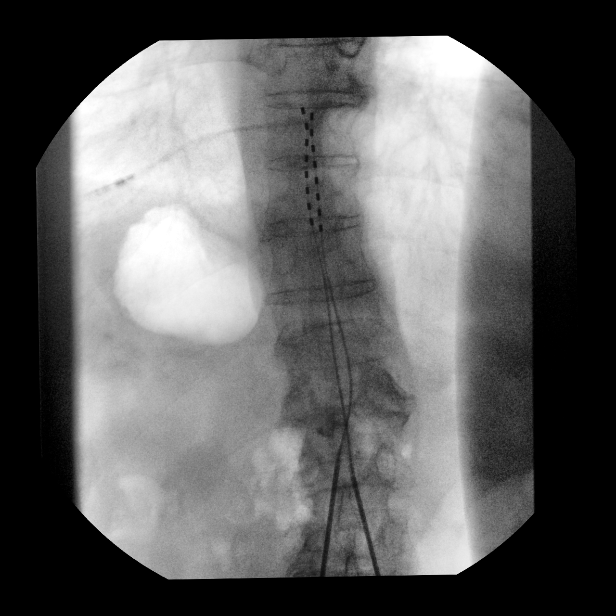
[im 17/23]
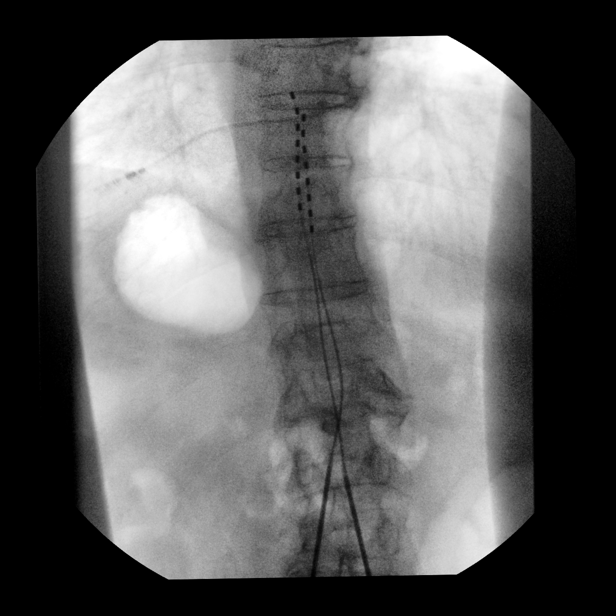
[im 18/23]
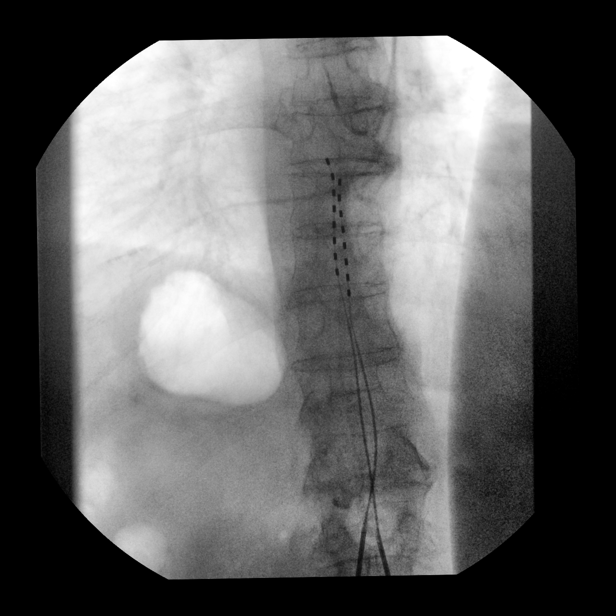
[im 20/23]
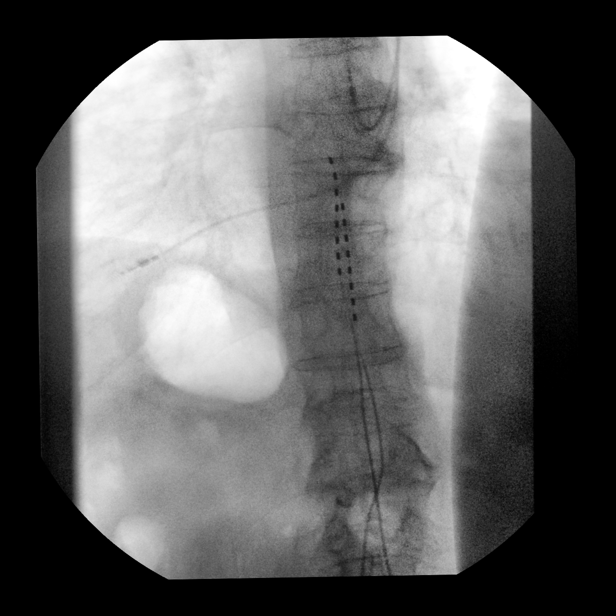
[im 21/23]
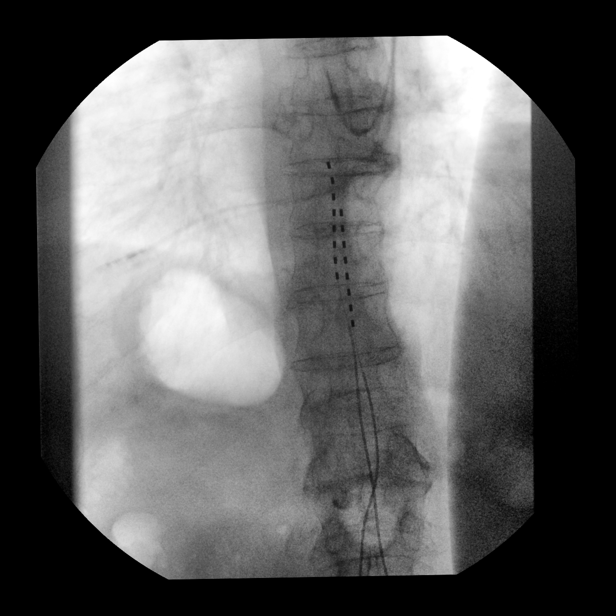
[im 23/23]
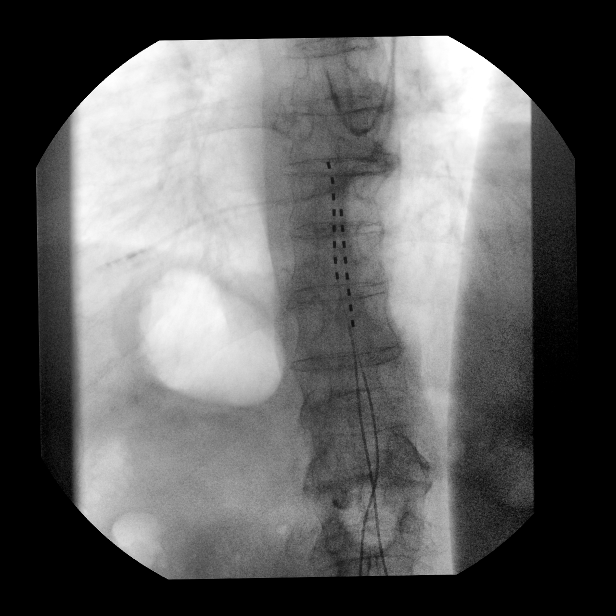

[15 of 23 positions shown; findings below may reference images not displayed]

FINDINGS: Images demonstrate placement of an intraspinal stimulator.

Lead was placed dorsally within the lower thoracic spine at T8-T9.

Bones demineralized.

Compression fracture of the T11 vertebral body with marked anterior
height loss noted.
IMPRESSION: Intraspinal stimulator placement at T8-T9.

## 2020-10-11 IMAGING — RF DG LUMBAR SPINE 2-3V
1 series · 15 of 23 positions shown · non-contrast
Comparison: None

FLUOROSCOPY TIME:  2 minutes 48 seconds

Dose: 79.62 mGy

Images: 21

CLINICAL DATA: Intraspinal stimulator placement intraoperatively

EXAM:
LUMBAR SPINE - 2-3 VIEW; DG C-ARM 1-60 MIN

[Series 1: dg x-ray · 0.20mm/px · 15 of 23 slices shown]
[im 1/23]
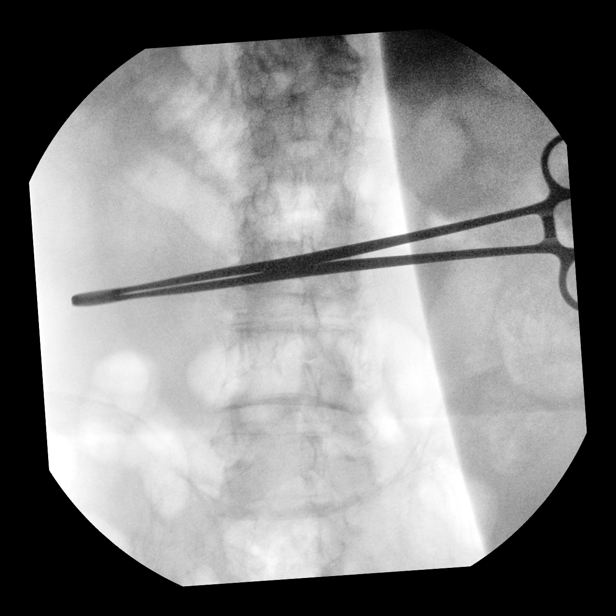
[im 3/23]
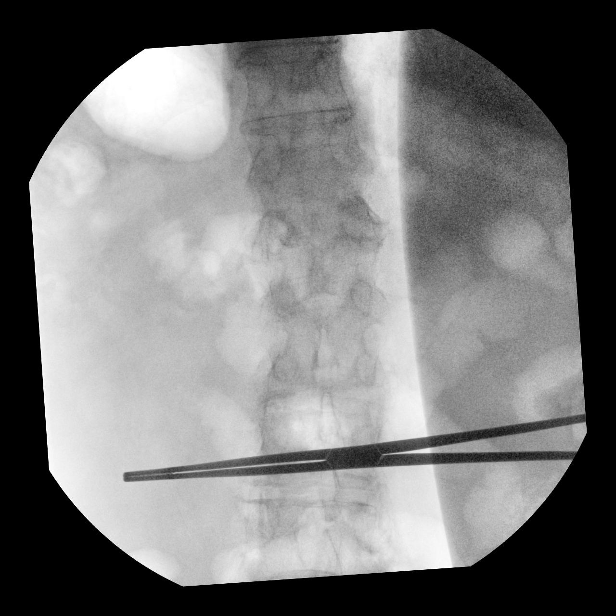
[im 4/23]
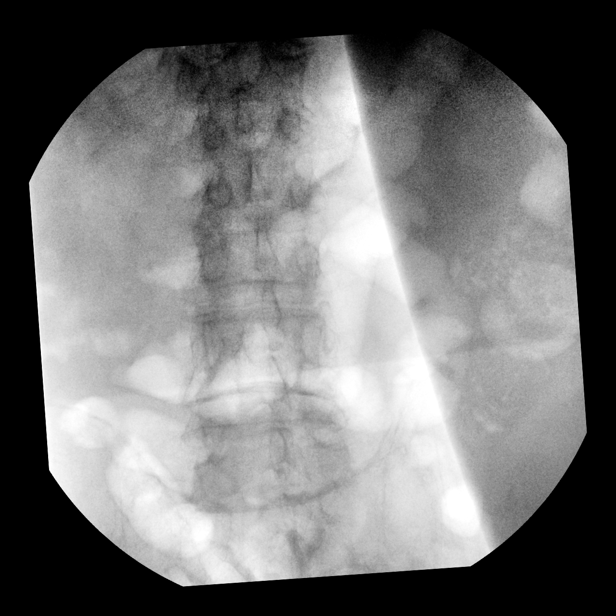
[im 6/23]
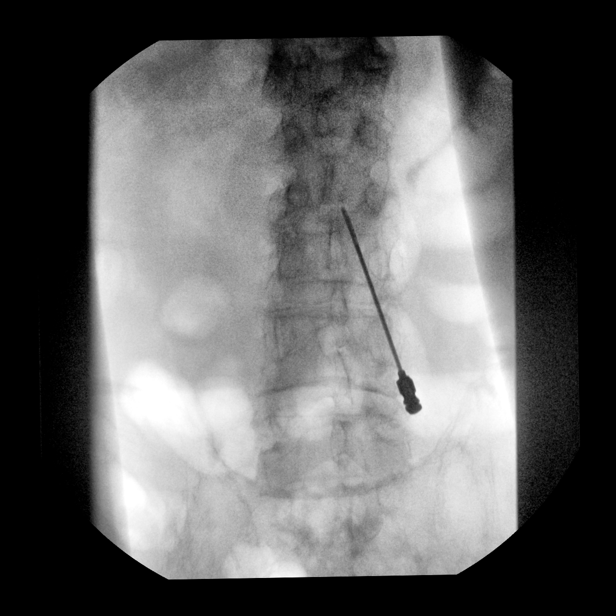
[im 7/23]
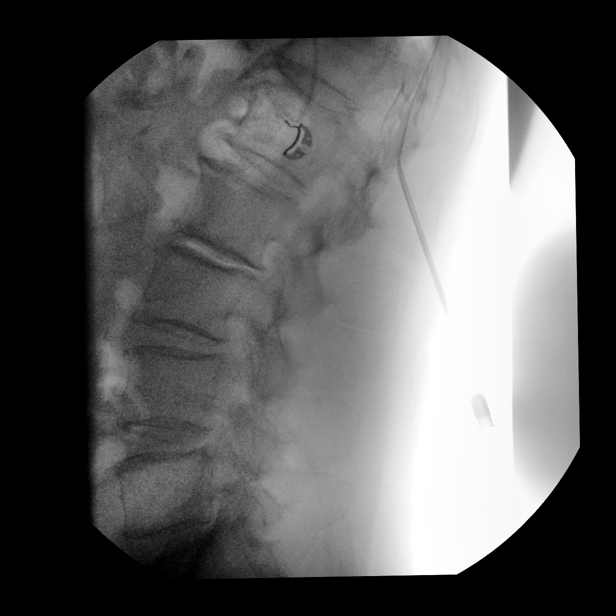
[im 9/23]
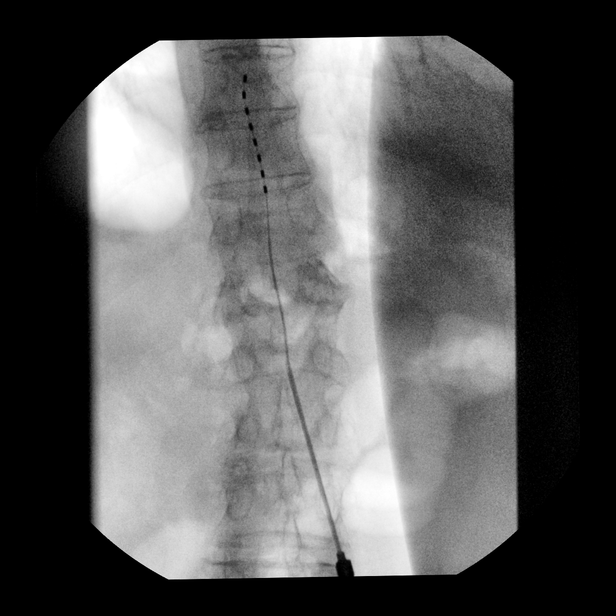
[im 10/23]
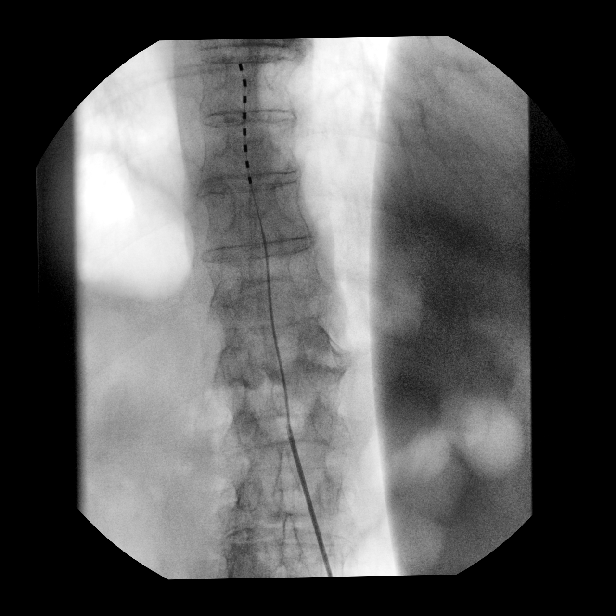
[im 12/23]
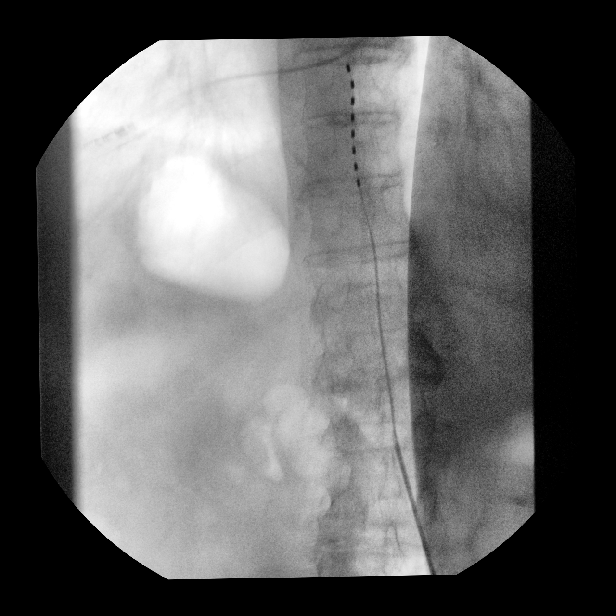
[im 14/23]
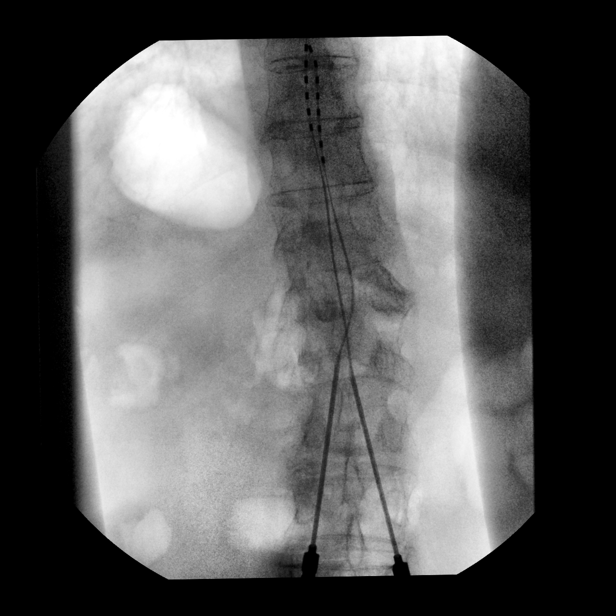
[im 15/23]
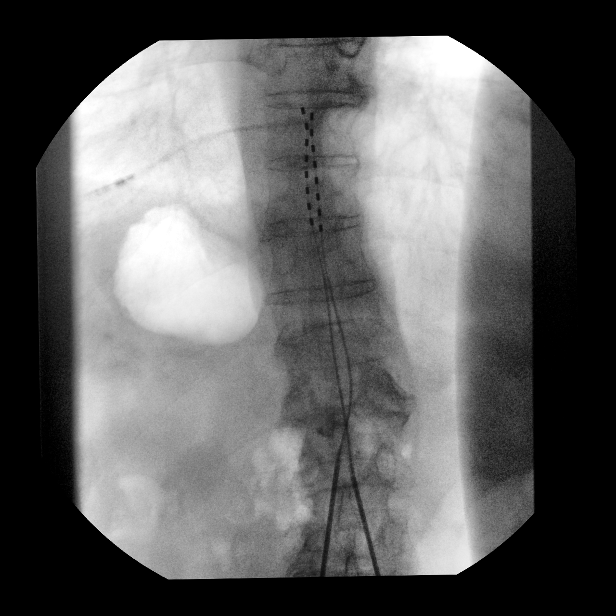
[im 17/23]
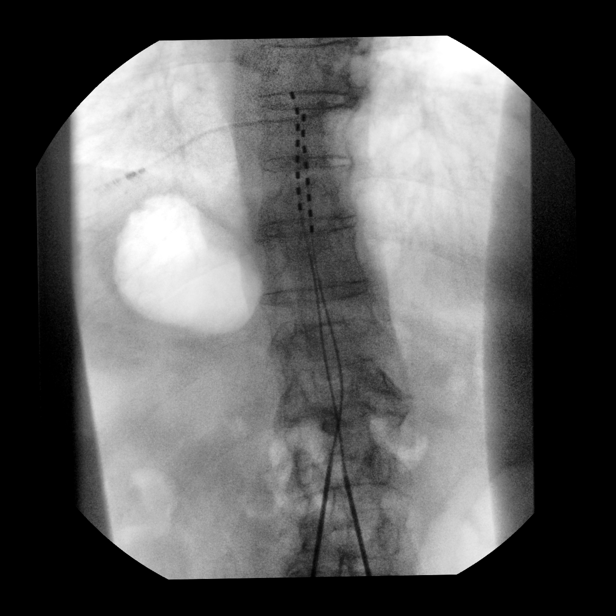
[im 18/23]
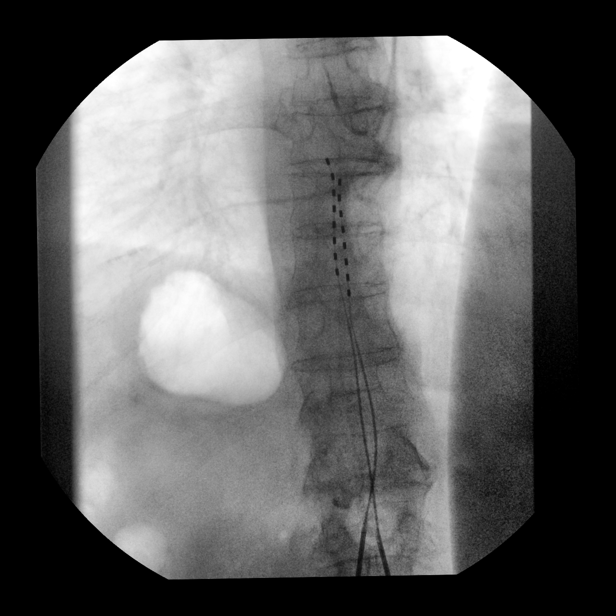
[im 20/23]
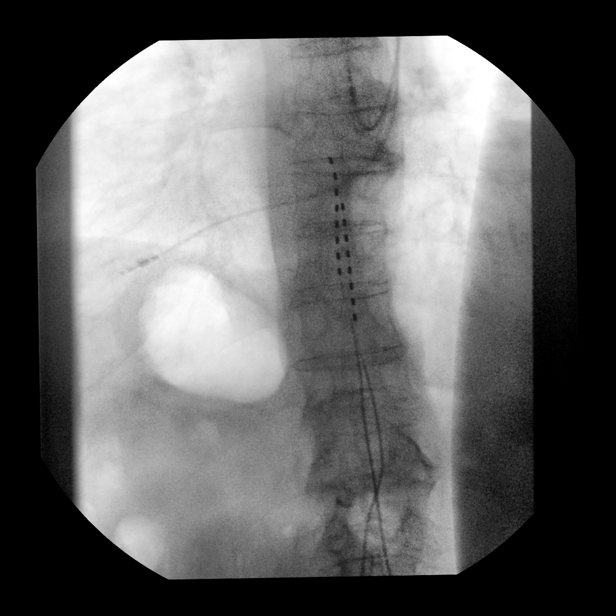
[im 21/23]
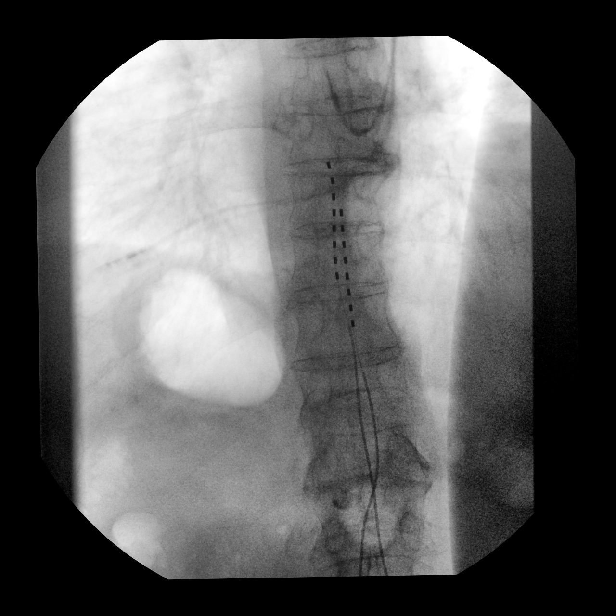
[im 23/23]
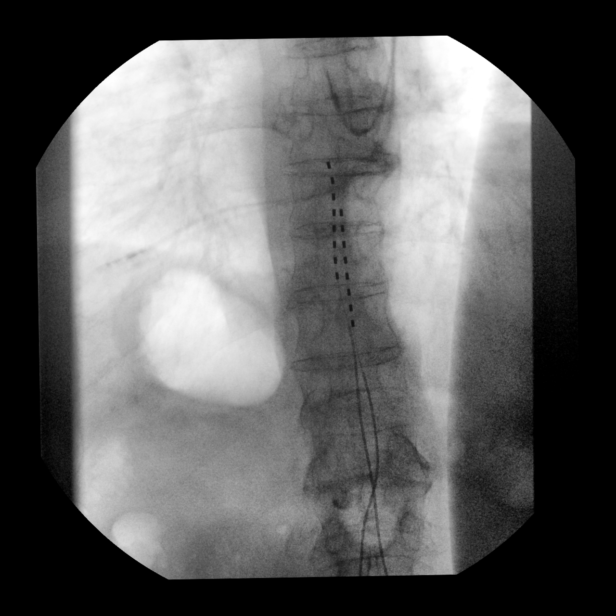

[15 of 23 positions shown; findings below may reference images not displayed]

FINDINGS: Images demonstrate placement of an intraspinal stimulator.

Lead was placed dorsally within the lower thoracic spine at T8-T9.

Bones demineralized.

Compression fracture of the T11 vertebral body with marked anterior
height loss noted.
IMPRESSION: Intraspinal stimulator placement at T8-T9.

## 2020-10-11 SURGERY — LUMBAR SPINAL CORD STIMULATOR TRIAL
Anesthesia: General

## 2020-10-11 MED ORDER — BUPIVACAINE-EPINEPHRINE (PF) 0.5% -1:200000 IJ SOLN
INTRAMUSCULAR | Status: DC | PRN
  Administered 2020-10-11: 5 mL via PERINEURAL

## 2020-10-11 MED ORDER — CHLORHEXIDINE GLUCONATE 0.12 % MT SOLN
OROMUCOSAL | Status: AC
  Administered 2020-10-11: 15 mL via OROMUCOSAL
  Filled 2020-10-11: qty 15

## 2020-10-11 MED ORDER — ONDANSETRON HCL 4 MG/2ML IJ SOLN: 4.0000 mg | Freq: Once | INTRAMUSCULAR | Status: DC | PRN

## 2020-10-11 MED ORDER — REMIFENTANIL HCL 1 MG IV SOLR
INTRAVENOUS | Status: AC
  Filled 2020-10-11: qty 1000

## 2020-10-11 MED ORDER — FENTANYL CITRATE (PF) 100 MCG/2ML IJ SOLN: 25.0000 ug | INTRAMUSCULAR | Status: DC | PRN

## 2020-10-11 MED ORDER — ONDANSETRON HCL 4 MG/2ML IJ SOLN
INTRAMUSCULAR | Status: DC | PRN
  Administered 2020-10-11: 4 mg via INTRAVENOUS

## 2020-10-11 MED ORDER — FENTANYL CITRATE (PF) 100 MCG/2ML IJ SOLN
INTRAMUSCULAR | Status: AC
  Filled 2020-10-11: qty 2

## 2020-10-11 MED ORDER — CEPHALEXIN 500 MG PO CAPS: 500.0000 mg | ORAL_CAPSULE | Freq: Two times a day (BID) | ORAL | 0 refills | Status: DC

## 2020-10-11 MED ORDER — ACETAMINOPHEN 10 MG/ML IV SOLN
INTRAVENOUS | Status: DC | PRN
  Administered 2020-10-11: 1000 mg via INTRAVENOUS

## 2020-10-11 MED ORDER — REMIFENTANIL HCL 1 MG IV SOLR
INTRAVENOUS | Status: DC | PRN
  Administered 2020-10-11: .05 ug/kg/min via INTRAVENOUS

## 2020-10-11 MED ORDER — PROPOFOL 10 MG/ML IV BOLUS
INTRAVENOUS | Status: DC | PRN
  Administered 2020-10-11: 120 mg via INTRAVENOUS
  Administered 2020-10-11: 30 mg via INTRAVENOUS

## 2020-10-11 MED ORDER — ACETAMINOPHEN 10 MG/ML IV SOLN
INTRAVENOUS | Status: AC
  Filled 2020-10-11: qty 100

## 2020-10-11 MED ORDER — FENTANYL CITRATE (PF) 100 MCG/2ML IJ SOLN
INTRAMUSCULAR | Status: DC | PRN
  Administered 2020-10-11: 25 ug via INTRAVENOUS

## 2020-10-11 MED ORDER — SUCCINYLCHOLINE CHLORIDE 20 MG/ML IJ SOLN
INTRAMUSCULAR | Status: DC | PRN
  Administered 2020-10-11: 100 mg via INTRAVENOUS

## 2020-10-11 MED ORDER — CEFAZOLIN SODIUM-DEXTROSE 2-4 GM/100ML-% IV SOLN
INTRAVENOUS | Status: AC
  Filled 2020-10-11: qty 100

## 2020-10-11 MED ORDER — PROPOFOL 1000 MG/100ML IV EMUL
INTRAVENOUS | Status: AC
  Filled 2020-10-11: qty 100

## 2020-10-11 MED ORDER — FAMOTIDINE 20 MG PO TABS
ORAL_TABLET | ORAL | Status: AC
  Administered 2020-10-11: 20 mg via ORAL
  Filled 2020-10-11: qty 1

## 2020-10-11 MED ORDER — LIDOCAINE HCL (CARDIAC) PF 100 MG/5ML IV SOSY
PREFILLED_SYRINGE | INTRAVENOUS | Status: DC | PRN
  Administered 2020-10-11: 100 mg via INTRAVENOUS

## 2020-10-11 SURGICAL SUPPLY — 34 items
APL PRP STRL LF DISP 70% ISPRP (MISCELLANEOUS) ×1
CANISTER SUCT 1200ML W/VALVE (MISCELLANEOUS) IMPLANT
CHLORAPREP W/TINT 26 (MISCELLANEOUS) ×2 IMPLANT
COUNTER NEEDLE 20/40 LG (NEEDLE) ×2 IMPLANT
COVER LIGHT HANDLE STERIS (MISCELLANEOUS) ×4 IMPLANT
COVER WAND RF STERILE (DRAPES) ×2 IMPLANT
DRAPE C-ARM XRAY 36X54 (DRAPES) ×4 IMPLANT
DRAPE C-ARMOR (DRAPES) ×2 IMPLANT
DRAPE LAPAROTOMY 100X77 ABD (DRAPES) ×2 IMPLANT
DRAPE SURG 17X11 SM STRL (DRAPES) ×2 IMPLANT
DRSG TEGADERM 4X4.75 (GAUZE/BANDAGES/DRESSINGS) ×2 IMPLANT
ELECT CAUTERY BLADE TIP 2.5 (TIP) ×2
ELECTRODE CAUTERY BLDE TIP 2.5 (TIP) ×1 IMPLANT
FEE INTRAOP MONITOR IMPULS NCS (MISCELLANEOUS) ×1 IMPLANT
GAUZE SPONGE 4X4 12PLY STRL (GAUZE/BANDAGES/DRESSINGS) ×4 IMPLANT
GLOVE SRG 8 PF TXTR STRL LF DI (GLOVE) ×1 IMPLANT
GLOVE SURG SYN 7.0 (GLOVE) IMPLANT
GLOVE SURG SYN 8.0 (GLOVE) ×2 IMPLANT
GLOVE SURG UNDER POLY LF SZ7 (GLOVE) IMPLANT
GLOVE SURG UNDER POLY LF SZ8 (GLOVE) ×2
GRADUATE 1200CC STRL 31836 (MISCELLANEOUS) ×2 IMPLANT
INTRAOP MONITOR FEE IMPULS NCS (MISCELLANEOUS) ×1
INTRAOP MONITOR FEE IMPULSE (MISCELLANEOUS) ×1
KIT TURNOVER KIT A (KITS) ×2 IMPLANT
LEAD KIT TRAIL 90CM (Lead) ×4 IMPLANT
MANIFOLD NEPTUNE II (INSTRUMENTS) ×2 IMPLANT
MARKER SKIN DUAL TIP RULER LAB (MISCELLANEOUS) ×4 IMPLANT
NS IRRIG 1000ML POUR BTL (IV SOLUTION) ×2 IMPLANT
PACK LAMINECTOMY NEURO (CUSTOM PROCEDURE TRAY) ×2 IMPLANT
PAD ARMBOARD 7.5X6 YLW CONV (MISCELLANEOUS) ×4 IMPLANT
STIMULATOR WIRELESS 74X79X20MM (MISCELLANEOUS) ×2 IMPLANT
SUT ETHILON 3-0 FS-10 30 BLK (SUTURE) ×4
SUTURE EHLN 3-0 FS-10 30 BLK (SUTURE) ×2 IMPLANT
TOWEL OR 17X26 4PK STRL BLUE (TOWEL DISPOSABLE) ×4 IMPLANT

## 2020-10-11 NOTE — Discharge Instructions (Addendum)
NEUROSURGERY DISCHARGE INSTRUCTIONS  Admission diagnosis: chronic pain syndrome g89.4  Operative procedure: Thoracic percutaneous spinal cord stimulator trial  What to do after you leave the hospital:  Recommended diet: cardiac diet. Increase protein intake to promote wound healing.  Recommended activity: no lifting, driving, or strenuous exercise for 4 weeks.  You may walk but avoid bending and rotation.  Special Instructions  No straining, no heavy lifting > 10lbs x 4 weeks.  Keep incision area clean and dry.  There are bandages over the incisions and these should remain.  Please do not get these wet.  You have been prescribed antibiotics, please take this as directed  Please take pain medications as directed. Take a stool softener if on pain medications  You should be contacting the representative regarding the stimulator settings to help control your pain over this week   Please Report any of the following: Nausea or Vomiting, Temperature is greater than 101.19F (38.1C) degrees, Dizziness, Abdominal Pain, Difficulty Breathing or Shortness of Breath, Inability to Eat, drink Fluids, or Take medications, Bleeding, swelling, or drainage from surgical incision sites, New numbness or weakness, and Bowel or bladder dysfunction to the neurosurgeon on call at [phone]  Additional Follow up appointments Please follow up with Dr Lacinda Axon next week for surgery on 4/11   Please see below for scheduled appointments:  Future Appointments  Date Time Provider [place]  10/14/2020  8:20 AM [facility] None  12/28/2020  1:15 PM [facility] None  12/28/2020  1:45 PM Cammie Sickle, MD [facility] None  12/28/2020  2:15 PM [facility] INFUSION CHAIR 3 [facility] None  02/17/2021 10:15 AM Ralene Bathe, MD [facility] None    AMBULATORY SURGERY  DISCHARGE INSTRUCTIONS   1) The drugs that you were given will stay in your system until tomorrow so for the next  24 hours you should not:  A) Drive an automobile B) Make any legal decisions C) Drink any alcoholic beverage   2) You may resume regular meals tomorrow.  Today it is better to start with liquids and gradually work up to solid foods.  You may eat anything you prefer, but it is better to start with liquids, then soup and crackers, and gradually work up to solid foods.   3) Please notify your doctor immediately if you have any unusual bleeding, trouble breathing, redness and pain at the surgery site, drainage, fever, or pain not relieved by medication.    4) Additional Instructions:        Please contact your physician with any problems or Same Day Surgery at [phone], Monday through Friday 6 am to 4 pm, or [place] at [facility] number at [phone].

## 2020-10-11 NOTE — Anesthesia Postprocedure Evaluation (Signed)
Anesthesia Post Note  Patient: Christopher [name]  Procedure(s) Performed: PERCUTANEOUS SPINAL CORD STIMULATOR TRIAL (N/A )  Patient location during evaluation: PACU Anesthesia Type: General Level of consciousness: awake and [name] and oriented Pain management: pain level controlled Vital Signs Assessment: post-procedure vital signs reviewed and stable Respiratory status: spontaneous breathing, nonlabored ventilation and respiratory function stable Cardiovascular status: blood pressure returned to baseline and stable Postop Assessment: no signs of nausea or vomiting Anesthetic complications: no   No complications documented.   Last Vitals:  Vitals:   10/11/20 1130 10/11/20 1148  BP: (!) 145/73 (!) 147/69  Pulse: 60 63  Resp: (!) 24 18  Temp:  (!) 36.2 C  SpO2: 96% 98%    Last Pain:  Vitals:   10/11/20 1148  TempSrc: Temporal  PainSc: 2                  Guiselle Mian

## 2020-10-11 NOTE — Transfer of Care (Signed)
Immediate Anesthesia Transfer of Care Note  Patient: Christopher [name]  Procedure(s) Performed: PERCUTANEOUS SPINAL CORD STIMULATOR TRIAL (N/A )  Patient Location: PACU  Anesthesia Type:General  Level of Consciousness: awake, drowsy and patient cooperative  Airway & Oxygen Therapy: Patient Spontanous Breathing and Patient connected to face mask oxygen  Post-op Assessment: Report given to RN and Post -op Vital signs reviewed and stable  Post vital signs: Reviewed and stable  Last Vitals:  Vitals Value Taken Time  BP 168/77 10/11/20 1103  Temp 36.3 C 10/11/20 1103  Pulse 61 10/11/20 1113  Resp 13 10/11/20 1113  SpO2 97 % 10/11/20 1113  Vitals shown include unvalidated device data.  Last Pain:  Vitals:   10/11/20 1103  TempSrc:   PainSc: 0-No pain      Patients Stated Pain Goal: 0 (48/59/27 [id number])  Complications: No complications documented.

## 2020-10-11 NOTE — OR Nursing (Signed)
Per Dr. Lacinda Axon, secure-chat, pt aware he cannot resume is aspirin & plavix until his surgery next week.  Also advises, no rx prescribed for this visit.

## 2020-10-11 NOTE — H&P (Signed)
Christopher [name] is an 87 y.o. male.   Chief Complaint: Chronic Pain Syndrome, back Pain HPI:Christopher [name] returns today with ongoing back and left leg pain. He does feel this is worsened over the past few months where he was doing more lifting. His right sided back pain has improved slightly. He did go to get a MRI of the thoracic spine as well as a psychological eval after our last visit in preparation for possible spinal cord stimulator trial. He additionally has undergone evaluation of bladder cancer. Currently, they are planning to do more surveillance and he is not under any current treatment. He has had a Foley catheter removed. He is still on Eliquis. We have discussed a permanent percutaneous SCS trial and he wishes to proceed.    Past Medical History:  Diagnosis Date  . A-fib ([place])   . Anemia   . Arthritis    BACK  . Bladder cancer ([place])   . BPH (benign prostatic hyperplasia)   . Cancer ([place])    skin cancer on face carcinoma  . ED (erectile dysfunction)   . Gilbert disease   . HLD (hyperlipidemia)   . Hypertension   . Macular degeneration   . Mollusca contagiosa   . Osteopenia   . Presence of permanent cardiac pacemaker   . Sensorineural hearing loss   . Squamous cell carcinoma of skin 08/25/2019   R cheek   . Squamous cell carcinoma of skin 02/12/2020   L neck     Past Surgical History:  Procedure Laterality Date  . CATARACT EXTRACTION Bilateral   . CYSTOSCOPY WITH BIOPSY N/A 01/20/2020   Procedure: CYSTOSCOPY WITH BIOPSY;  Surgeon: Abbie Sons, MD;  Location: [facility];  Service: Urology;  Laterality: N/A;  . CYSTOSCOPY WITH FULGERATION N/A 01/20/2020   Procedure: CYSTOSCOPY WITH FULGERATION;  Surgeon: Abbie Sons, MD;  Location: [facility];  Service: Urology;  Laterality: N/A;  . CYSTOSCOPY WITH FULGERATION N/A 04/19/2020   Procedure: [place] with clot evacuation;  Surgeon: Abbie Sons, MD;  Location: [facility];  Service: Urology;   Laterality: N/A;  . HEMORRHOIDECTOMY WITH HEMORRHOID BANDING    . KNEE ARTHROSCOPY Left   . PACEMAKER INSERTION N/A 03/03/2015   Procedure: INSERTION DUAL LEAD PACEMAKER;  Surgeon: Isaias Cowman, MD;  Location: [facility];  Service: Cardiovascular;  Laterality: N/A;  . TONSILLECTOMY    . TRANSURETHRAL RESECTION OF BLADDER TUMOR N/A 10/07/2019   Procedure: TRANSURETHRAL RESECTION OF BLADDER TUMOR (TURBT);  Surgeon: Abbie Sons, MD;  Location: [facility];  Service: Urology;  Laterality: N/A;  . TRANSURETHRAL RESECTION OF BLADDER TUMOR N/A 04/13/2020   Procedure: TRANSURETHRAL RESECTION OF BLADDER TUMOR (TURBT);  Surgeon: Abbie Sons, MD;  Location: [facility];  Service: Urology;  Laterality: N/A;    Family History  Problem Relation Age of Onset  . Diabetes Mother   . Lung cancer Father   . Cancer Paternal Uncle   . Prostate cancer Neg Hx   . Bladder Cancer Neg Hx   . Kidney cancer Neg Hx    Social History:  reports that he has never smoked. He has never used smokeless tobacco. He reports current alcohol use of about 7.0 standard drinks of alcohol per week. He reports that he does not use drugs.  Allergies:  Allergies  Allergen Reactions  . Atenolol     Happened a long time ago and can't recall    Medications Prior to Admission  Medication Sig Dispense Refill  .  alfuzosin (UROXATRAL) 10 MG 24 hr tablet Take 1 tablet (10 mg total) by mouth daily with breakfast. 90 tablet 3  . apixaban (ELIQUIS) 5 MG TABS tablet Take 5 mg by mouth 2 (two) times daily.    Marland Kitchen atorvastatin (LIPITOR) 10 MG tablet Take 10 mg by mouth in the morning.    . calcium carbonate (OSCAL) 1500 (600 Ca) MG TABS tablet Take 600 mg of elemental calcium by mouth in the morning.    . Cholecalciferol (VITAMIN D3) 50 MCG (2000 UT) TABS Take 2,000 Units by mouth in the morning.    . ferrous sulfate 325 (65 FE) MG tablet Take 325 mg by mouth in the morning and at bedtime.    . finasteride (PROSCAR) 5 MG tablet Take 1  tablet (5 mg total) by mouth daily. (Patient taking differently: Take 5 mg by mouth in the morning.) 90 tablet 3  . metoprolol succinate (TOPROL-XL) 25 MG 24 hr tablet Take 25 mg by mouth in the morning. 25 mg + 50 mg=75 mg    . metoprolol succinate (TOPROL-XL) 50 MG 24 hr tablet Take 50 mg by mouth in the morning. 50 mg + 25 mg=75 mg    . Multiple Vitamin (MULTIVITAMIN WITH MINERALS) TABS tablet Take 1 tablet by mouth in the morning.    . Multiple Vitamins-Minerals (PRESERVISION AREDS 2 PO) Take 1 tablet by mouth in the morning and at bedtime.    . Omega-3 Fatty Acids (FISH OIL) 1200 MG CAPS Take 1,200 mg by mouth in the morning.    Vladimir Faster Glycol-Propyl Glycol (LUBRICANT EYE DROPS) 0.4-0.3 % SOLN Place 1 drop into both eyes in the morning and at bedtime.    . tamsulosin (FLOMAX) 0.4 MG CAPS capsule Take 0.4 mg by mouth in the morning and at bedtime.    Marland Kitchen aspirin EC 81 MG tablet Take 81 mg by mouth daily. Swallow whole.      No results found for this or any previous visit (from the past 48 hour(s)). No results found.  Review of Systems General ROS: Negative Respiratory ROS: Negative Cardiovascular ROS: Negative Gastrointestinal ROS: Negative Genito-Urinary ROS: Negative Musculoskeletal ROS: Positive for back pain Neurological ROS: Positive for leg pain Dermatological ROS: Negative   Blood pressure 138/75, pulse 76, temperature 97.8 F (36.6 C), temperature source Temporal, resp. rate 16, height 5\' 11"  (1.803 m), weight 91.4 kg, SpO2 98 %. Physical Exam  General appearance: Alert, cooperative, in no acute distress Head: Normocephalic, atraumatic Eyes: Normal, EOM intact Oropharynx: Wearing facemask CV: Regular rate and rhythm Pulm: Clear to auscultation Back: Tenderness to palpation of the lower lumbar region Ext: Mild edema lower extremities  Neurologic exam:  Mental status: alertness: alert, affect: normal Speech: fluent and clear Motor:strength symmetric 5/5, normal  muscle mass and tone in bilateral hip flexion, knee flexion, knee extension, dorsiflexion, plantar flexion Sensory: intact to light touch in bilateral lower extremities Gait: Slow antalgic gait   Assessment/Plan Proceed with thoracic SCS perm trial  [name], MD 10/11/2020, 8:38 AM

## 2020-10-11 NOTE — H&P (View-Only) (Signed)
Christopher [name] is an 87 y.o. male.   Chief Complaint: Chronic Pain Syndrome, back Pain HPI:Christopher [name] returns today with ongoing back and left leg pain. He does feel this is worsened over the past few months where he was doing more lifting. His right sided back pain has improved slightly. He did go to get a MRI of the thoracic spine as well as a psychological eval after our last visit in preparation for possible spinal cord stimulator trial. He additionally has undergone evaluation of bladder cancer. Currently, they are planning to do more surveillance and he is not under any current treatment. He has had a Foley catheter removed. He is still on Eliquis. We have discussed a permanent percutaneous SCS trial and he wishes to proceed.    Past Medical History:  Diagnosis Date  . A-fib ([place])   . Anemia   . Arthritis    BACK  . Bladder cancer ([place])   . BPH (benign prostatic hyperplasia)   . Cancer ([place])    skin cancer on face carcinoma  . ED (erectile dysfunction)   . Gilbert disease   . HLD (hyperlipidemia)   . Hypertension   . Macular degeneration   . Mollusca contagiosa   . Osteopenia   . Presence of permanent cardiac pacemaker   . Sensorineural hearing loss   . Squamous cell carcinoma of skin 08/25/2019   R cheek   . Squamous cell carcinoma of skin 02/12/2020   L neck     Past Surgical History:  Procedure Laterality Date  . CATARACT EXTRACTION Bilateral   . CYSTOSCOPY WITH BIOPSY N/A 01/20/2020   Procedure: CYSTOSCOPY WITH BIOPSY;  Surgeon: Abbie Sons, MD;  Location: [facility];  Service: Urology;  Laterality: N/A;  . CYSTOSCOPY WITH FULGERATION N/A 01/20/2020   Procedure: CYSTOSCOPY WITH FULGERATION;  Surgeon: Abbie Sons, MD;  Location: [facility];  Service: Urology;  Laterality: N/A;  . CYSTOSCOPY WITH FULGERATION N/A 04/19/2020   Procedure: [place] with clot evacuation;  Surgeon: Abbie Sons, MD;  Location: [facility];  Service: Urology;   Laterality: N/A;  . HEMORRHOIDECTOMY WITH HEMORRHOID BANDING    . KNEE ARTHROSCOPY Left   . PACEMAKER INSERTION N/A 03/03/2015   Procedure: INSERTION DUAL LEAD PACEMAKER;  Surgeon: Isaias Cowman, MD;  Location: [facility];  Service: Cardiovascular;  Laterality: N/A;  . TONSILLECTOMY    . TRANSURETHRAL RESECTION OF BLADDER TUMOR N/A 10/07/2019   Procedure: TRANSURETHRAL RESECTION OF BLADDER TUMOR (TURBT);  Surgeon: Abbie Sons, MD;  Location: [facility];  Service: Urology;  Laterality: N/A;  . TRANSURETHRAL RESECTION OF BLADDER TUMOR N/A 04/13/2020   Procedure: TRANSURETHRAL RESECTION OF BLADDER TUMOR (TURBT);  Surgeon: Abbie Sons, MD;  Location: [facility];  Service: Urology;  Laterality: N/A;    Family History  Problem Relation Age of Onset  . Diabetes Mother   . Lung cancer Father   . Cancer Paternal Uncle   . Prostate cancer Neg Hx   . Bladder Cancer Neg Hx   . Kidney cancer Neg Hx    Social History:  reports that he has never smoked. He has never used smokeless tobacco. He reports current alcohol use of about 7.0 standard drinks of alcohol per week. He reports that he does not use drugs.  Allergies:  Allergies  Allergen Reactions  . Atenolol     Happened a long time ago and can't recall    Medications Prior to Admission  Medication Sig Dispense Refill  .  alfuzosin (UROXATRAL) 10 MG 24 hr tablet Take 1 tablet (10 mg total) by mouth daily with breakfast. 90 tablet 3  . apixaban (ELIQUIS) 5 MG TABS tablet Take 5 mg by mouth 2 (two) times daily.    Marland Kitchen atorvastatin (LIPITOR) 10 MG tablet Take 10 mg by mouth in the morning.    . calcium carbonate (OSCAL) 1500 (600 Ca) MG TABS tablet Take 600 mg of elemental calcium by mouth in the morning.    . Cholecalciferol (VITAMIN D3) 50 MCG (2000 UT) TABS Take 2,000 Units by mouth in the morning.    . ferrous sulfate 325 (65 FE) MG tablet Take 325 mg by mouth in the morning and at bedtime.    . finasteride (PROSCAR) 5 MG tablet Take 1  tablet (5 mg total) by mouth daily. (Patient taking differently: Take 5 mg by mouth in the morning.) 90 tablet 3  . metoprolol succinate (TOPROL-XL) 25 MG 24 hr tablet Take 25 mg by mouth in the morning. 25 mg + 50 mg=75 mg    . metoprolol succinate (TOPROL-XL) 50 MG 24 hr tablet Take 50 mg by mouth in the morning. 50 mg + 25 mg=75 mg    . Multiple Vitamin (MULTIVITAMIN WITH MINERALS) TABS tablet Take 1 tablet by mouth in the morning.    . Multiple Vitamins-Minerals (PRESERVISION AREDS 2 PO) Take 1 tablet by mouth in the morning and at bedtime.    . Omega-3 Fatty Acids (FISH OIL) 1200 MG CAPS Take 1,200 mg by mouth in the morning.    Vladimir Faster Glycol-Propyl Glycol (LUBRICANT EYE DROPS) 0.4-0.3 % SOLN Place 1 drop into both eyes in the morning and at bedtime.    . tamsulosin (FLOMAX) 0.4 MG CAPS capsule Take 0.4 mg by mouth in the morning and at bedtime.    Marland Kitchen aspirin EC 81 MG tablet Take 81 mg by mouth daily. Swallow whole.      No results found for this or any previous visit (from the past 48 hour(s)). No results found.  Review of Systems General ROS: Negative Respiratory ROS: Negative Cardiovascular ROS: Negative Gastrointestinal ROS: Negative Genito-Urinary ROS: Negative Musculoskeletal ROS: Positive for back pain Neurological ROS: Positive for leg pain Dermatological ROS: Negative   Blood pressure 138/75, pulse 76, temperature 97.8 F (36.6 C), temperature source Temporal, resp. rate 16, height 5\' 11"  (1.803 m), weight 91.4 kg, SpO2 98 %. Physical Exam  General appearance: Alert, cooperative, in no acute distress Head: Normocephalic, atraumatic Eyes: Normal, EOM intact Oropharynx: Wearing facemask CV: Regular rate and rhythm Pulm: Clear to auscultation Back: Tenderness to palpation of the lower lumbar region Ext: Mild edema lower extremities  Neurologic exam:  Mental status: alertness: alert, affect: normal Speech: fluent and clear Motor:strength symmetric 5/5, normal  muscle mass and tone in bilateral hip flexion, knee flexion, knee extension, dorsiflexion, plantar flexion Sensory: intact to light touch in bilateral lower extremities Gait: Slow antalgic gait   Assessment/Plan Proceed with thoracic SCS perm trial  [name], MD 10/11/2020, 8:38 AM

## 2020-10-11 NOTE — Interval H&P Note (Signed)
History and Physical Interval Note:  10/11/2020 8:40 AM  Christopher [name]  has presented today for surgery, with the diagnosis of chronic pain syndrome g89.4.  The various methods of treatment have been discussed with the patient and family. After consideration of risks, benefits and other options for treatment, the patient has consented to  Procedure(s): PERCUTANEOUS SPINAL CORD STIMULATOR TRIAL (N/A) as a surgical intervention.  The patient's history has been reviewed, patient examined, no change in status, stable for surgery.  I have reviewed the patient's chart and labs.  Questions were answered to the patient's satisfaction.     [name]

## 2020-10-11 NOTE — Anesthesia Procedure Notes (Signed)
Procedure Name: Intubation Date/Time: 10/11/2020 9:25 AM Performed by: Lowry Bowl, CRNA Pre-anesthesia Checklist: Patient identified, Emergency Drugs available, Suction available and Patient being monitored Patient Re-evaluated:Patient Re-evaluated prior to induction Oxygen Delivery Method: Circle system utilized Preoxygenation: Pre-oxygenation with 100% oxygen Induction Type: IV induction Ventilation: Mask ventilation without difficulty Laryngoscope Size: McGraph and 4 Grade View: Grade I Tube type: Oral Tube size: 7.5 mm Number of attempts: 1 Airway Equipment and Method: Stylet and Video-laryngoscopy Placement Confirmation: ETT inserted through vocal cords under direct vision,  positive ETCO2 and breath sounds checked- equal and bilateral Secured at: 21 cm Tube secured with: Tape Dental Injury: Teeth and Oropharynx as per pre-operative assessment

## 2020-10-11 NOTE — Op Note (Signed)
Operative Note   SURGERY DATE: 10/11/2020   PRE-OP DIAGNOSIS:  Chronic pain syndrome   POST-OP DIAGNOSIS: Post-Op Diagnosis Codes: Chronic pain syndrome   Procedure(s) with comments: Thoracic Percutaneous Stimulator Leads  SURGEON:     * Malen Gauze, MD       Liliane Bade, PA Assistant   ANESTHESIA: General    OPERATIVE FINDINGS: Successful placement of thoracic spinal cord stimulator trial leads   Indication Christopher [name] was seen in clinic on 1/25 with ongoing back and leg pain despite multiple types of  medical management.Marland Kitchen MRI of the spine revealed no concerning stenosis at the level of the implant. The patient wished to proceed to a stimulator trial to decrease medication use and achieve better pain control. Risks including damage to spinal cord, weakness, hematoma, infection, failure of pain relief, post-operative pain, need for revision, stroke, heart attack, pneumonia, and spinal cord injury were discussed. We discussed using anesthesia given his age and health risks. He was off Eliquis for the surgery.   Procedure The patient was brought to the operating room where vascular access was obtained and intubated by the anesthesia service. Neuromonitoring electrodes were placed for SSEP and EMG with good baselines obtained. Antibiotics were given.  The patient was turned prone onto gel rolls. Fluoroscopy was used to confirm entry site in lumbar area and the level of T8 vertebral body. The patient was prepped and draped in a sterile fashion. A hard time out was performed. Local anesthetic was instilled.  The Touhy needle was used to approach through the skin at the level of the L3 vertebral body.  This was advanced in a caudal direction to the T12/L1 laminar interspace.  Using a loss-of-resistance technique and fluoroscopy images, the epidural space was obtained.  This was confirmed with a metal stylette.  Next, the percutaneous lead was placed through the Touhy needle and advanced  under fluoroscopic guidance in the epidural space to the level of the T8 vertebral body.  The electrodes were placed at the T7-8 disc space.  Next, an additional Touhy needle was placed on the contralateral side in a similar fashion.  An additional lead was then placed at the mid T8 vertebral body.  Monitor remained stable at this point.  We did stimulate through the percutaneous leads in the patient had stimulation in the lower extremities bilaterally.  Next, the 2 needles were removed and fluoroscopy confirmed good placement of the stimulator leads.  These were secured at the skin with 3-0 nylon. The leads were then secured to the battery and dressings were applied.  A final fluoroscopic image was taken show good placement of the leads.  The patient was returned to supine position and extubated. The patient was seen to be moving all extremities symmetrically and was taken to PACU for recovery. The family was updated and all questions answered.   ESTIMATED BLOOD LOSS:   5 cc   SPECIMENS None   IMPLANT LEAD KIT TRAIL 90CM - [id number]  Inventory Item: LEAD KIT TRAIL 90CM Serial no.:  Model/Cat no.: [id number]  Implant name: LEAD KIT TRAIL 90CM - [id number] Laterality: N/A Area: Back   Manufacturer: MEDTRONIC NEUROMOD PAIN MGMT Date of Manufacture:    Action: Implanted Number Used: 1   Device Identifier:  Device Identifier Type:     LEAD KIT TRAIL 90CM - [id number]  Inventory Item: LEAD KIT TRAIL 90CM Serial no.:  Model/Cat no.: [id number]  Implant name: LEAD KIT TRAIL 90CM - [id number] Laterality: N/A Area: Back  Manufacturer: MEDTRONIC NEUROMOD PAIN MGMT Date of Manufacture:    Action: Implanted Number Used: 1   Device Identifier:  Device Identifier Type:          I performed the case in its entirety with assistance of PA, [facility]   [name], [place]

## 2020-10-11 NOTE — Anesthesia Preprocedure Evaluation (Signed)
Anesthesia Evaluation  Patient identified by MRN, date of birth, ID band Patient awake    Reviewed: Allergy & Precautions, NPO status , Patient's Chart, lab work & pertinent test results  History of Anesthesia Complications Negative for: history of anesthetic complications  Airway Mallampati: II  TM Distance: >3 FB Neck ROM: Full    Dental  (+) Poor Dentition   Pulmonary neg pulmonary ROS, neg sleep apnea, neg COPD,    breath sounds clear to auscultation- rhonchi (-) wheezing      Cardiovascular hypertension, Pt. on medications (-) CAD, (-) Past MI, (-) Cardiac Stents and (-) CABG + dysrhythmias Atrial Fibrillation + pacemaker  Rhythm:Regular Rate:Normal - Systolic murmurs and - Diastolic murmurs    Neuro/Psych neg Seizures negative neurological ROS  negative psych ROS   GI/Hepatic negative GI ROS, Neg liver ROS,   Endo/Other  negative endocrine ROSneg diabetes  Renal/GU negative Renal ROS     Musculoskeletal  (+) Arthritis ,   Abdominal (+) - obese,   Peds  Hematology  (+) anemia ,   Anesthesia Other Findings Past Medical History: No date: A-fib (HCC) No date: Anemia No date: Arthritis     Comment:  BACK No date: Bladder cancer (HCC) No date: BPH (benign prostatic hyperplasia) No date: Cancer ([place])     Comment:  skin cancer on face carcinoma No date: ED (erectile dysfunction) No date: Rosanna Randy disease No date: HLD (hyperlipidemia) No date: Hypertension No date: Macular degeneration No date: Mollusca contagiosa No date: Osteopenia No date: Presence of permanent cardiac pacemaker No date: Sensorineural hearing loss 08/25/2019: Squamous cell carcinoma of skin     Comment:  R cheek  02/12/2020: Squamous cell carcinoma of skin     Comment:  L neck    Reproductive/Obstetrics                             Anesthesia Physical Anesthesia Plan  ASA: III  Anesthesia Plan: General    Post-op Pain Management:    Induction: Intravenous  PONV Risk Score and Plan: 1 and Ondansetron and Dexamethasone  Airway Management Planned: Oral ETT  Additional Equipment:   Intra-op Plan:   Post-operative Plan: Extubation in OR  Informed Consent: I have reviewed the patients History and Physical, chart, labs and discussed the procedure including the risks, benefits and alternatives for the proposed anesthesia with the patient or authorized representative who has indicated his/her understanding and acceptance.     Dental advisory given  Plan Discussed with: CRNA and Anesthesiologist  Anesthesia Plan Comments:         Anesthesia Quick Evaluation

## 2020-10-12 ENCOUNTER — Encounter: Payer: Self-pay | Admitting: Neurosurgery

## 2020-10-14 ENCOUNTER — Encounter
Admission: RE | Admit: 2020-10-14 | Discharge: 2020-10-14 | Disposition: A | Discharge status: Home / Self Care | Payer: Medicare HMO | Source: Ambulatory Visit | Attending: Neurosurgery | Admitting: Neurosurgery

## 2020-10-14 ENCOUNTER — Other Ambulatory Visit: Payer: Self-pay

## 2020-10-14 DIAGNOSIS — Z20822 Contact with and (suspected) exposure to covid-19: Secondary | ICD-10-CM | POA: Diagnosis not present

## 2020-10-14 DIAGNOSIS — Z01812 Encounter for preprocedural laboratory examination: Secondary | ICD-10-CM | POA: Diagnosis not present

## 2020-10-14 LAB — TYPE AND SCREEN
ABO/RH(D): A POS
Antibody Screen: NEGATIVE

## 2020-10-14 NOTE — Progress Notes (Signed)
  Perioperative Services Pre-Admission/Anesthesia Testing     Date: 10/14/20  Name: Christopher [name] MRN:   [id number]  Re: Clearance for surgery   Case: [id number] Date/Time: 10/18/20 0700   Procedures:      UNILATERAL PULSE GENERATOR IMPLANT (N/A )     INSERTION CERVICAL SPINAL STIMULATOR (N/A )   Anesthesia type: Monitor Anesthesia Care   Pre-op diagnosis: chronic pain syndrome g89.4   Location: [facility] OR ROOM 03 [facility]   Surgeons: [name], MD    Patient scheduled for the above procedure on 10/18/2020 with Dr. [name].  Of note, patient recently reviewed by PAT APP for preanesthesia clearance prior to an uncomplicated unilateral pulse generator implant performed by the same physician; see APP note dated 10/05/2020.  Available interval history reviewed.  There has been no significant changes since patient's previous procedure.  Labs and ECG are up-to-date.  Patient has been cleared by his cardiologist Ubaldo Glassing, MD) as having a LOW risk for significant perioperative cardiovascular complications.  Patient has a PPM in place.  Perioperative prescription for cardiac device programming form completed by cardiologist placed on patient's chart for review by the surgical/anesthetic team on the day of surgery.  Impression and Plan:  Christopher [name] has been referred for pre-anesthesia review and clearance prior to him undergoing the planned anesthetic and procedural courses. Available labs, pertinent testing, and imaging results were personally reviewed by me. This patient has been appropriately cleared by cardiology with an overall LOW risk of significant perioperative cardiovascular complications.  Perioperative prescription for cardiac device programming form completed by primary attending cardiologist and placed on patient's chart for review by the surgical/anesthetic team.  Based on clinical review, barring any significant acute changes in the patient's overall  condition, it is anticipated that he will be able to proceed with the planned surgical intervention. Any acute changes in clinical condition may necessitate his procedure being postponed and/or cancelled. Pre-surgical instructions were reviewed with the patient during his PAT appointment and questions were fielded by PAT clinical staff.  Honor Loh, MSN, APRN, FNP-C, CEN [facility]  Peri-operative Services Nurse Practitioner Phone: [phone] 10/14/20 11:15 AM

## 2020-10-15 LAB — SARS CORONAVIRUS 2 (TAT 6-24 HRS): SARS Coronavirus 2: NEGATIVE

## 2020-10-18 ENCOUNTER — Ambulatory Visit: Payer: Medicare HMO | Admitting: Urgent Care

## 2020-10-18 ENCOUNTER — Encounter: Payer: Self-pay | Admitting: Neurosurgery

## 2020-10-18 ENCOUNTER — Ambulatory Visit: Payer: Medicare HMO

## 2020-10-18 ENCOUNTER — Ambulatory Visit
Admission: RE | Admit: 2020-10-18 | Discharge: 2020-10-18 | Disposition: A | Discharge status: Home / Self Care | Payer: Medicare HMO | Attending: Neurosurgery | Admitting: Neurosurgery

## 2020-10-18 ENCOUNTER — Encounter
Admission: RE | Disposition: A | Discharge status: Home / Self Care | Payer: Self-pay | Source: Home / Self Care | Attending: Neurosurgery

## 2020-10-18 ENCOUNTER — Other Ambulatory Visit: Payer: Self-pay

## 2020-10-18 DIAGNOSIS — Z419 Encounter for procedure for purposes other than remedying health state, unspecified: Secondary | ICD-10-CM

## 2020-10-18 DIAGNOSIS — M5136 Other intervertebral disc degeneration, lumbar region: Secondary | ICD-10-CM | POA: Insufficient documentation

## 2020-10-18 DIAGNOSIS — G894 Chronic pain syndrome: Secondary | ICD-10-CM | POA: Diagnosis present

## 2020-10-18 DIAGNOSIS — E785 Hyperlipidemia, unspecified: Secondary | ICD-10-CM | POA: Diagnosis not present

## 2020-10-18 DIAGNOSIS — Z7901 Long term (current) use of anticoagulants: Secondary | ICD-10-CM | POA: Insufficient documentation

## 2020-10-18 DIAGNOSIS — Z79899 Other long term (current) drug therapy: Secondary | ICD-10-CM | POA: Diagnosis not present

## 2020-10-18 DIAGNOSIS — I10 Essential (primary) hypertension: Secondary | ICD-10-CM | POA: Insufficient documentation

## 2020-10-18 DIAGNOSIS — Z950 Presence of cardiac pacemaker: Secondary | ICD-10-CM | POA: Diagnosis not present

## 2020-10-18 DIAGNOSIS — Z7982 Long term (current) use of aspirin: Secondary | ICD-10-CM | POA: Insufficient documentation

## 2020-10-18 HISTORY — PX: PULSE GENERATOR IMPLANT: SHX5370

## 2020-10-18 HISTORY — PX: THORACIC LAMINECTOMY FOR SPINAL CORD STIMULATOR: SHX6887

## 2020-10-18 IMAGING — RF DG THORACIC SPINE 2V
1 series · 2 of 2 positions shown · non-contrast
Comparison: CT abdomen pelvis-[DATE]

FLUOROSCOPY TIME:  6 minutes, 45 seconds (151 mGy)

CLINICAL DATA: Spinal cord stimulator implantation

EXAM:
THORACIC SPINE 2 VIEWS; DG C-ARM 1-60 MIN

[Series 1: dg x-ray · 0.20mm/px · 2 of 2 slices shown]
[im 1/2]
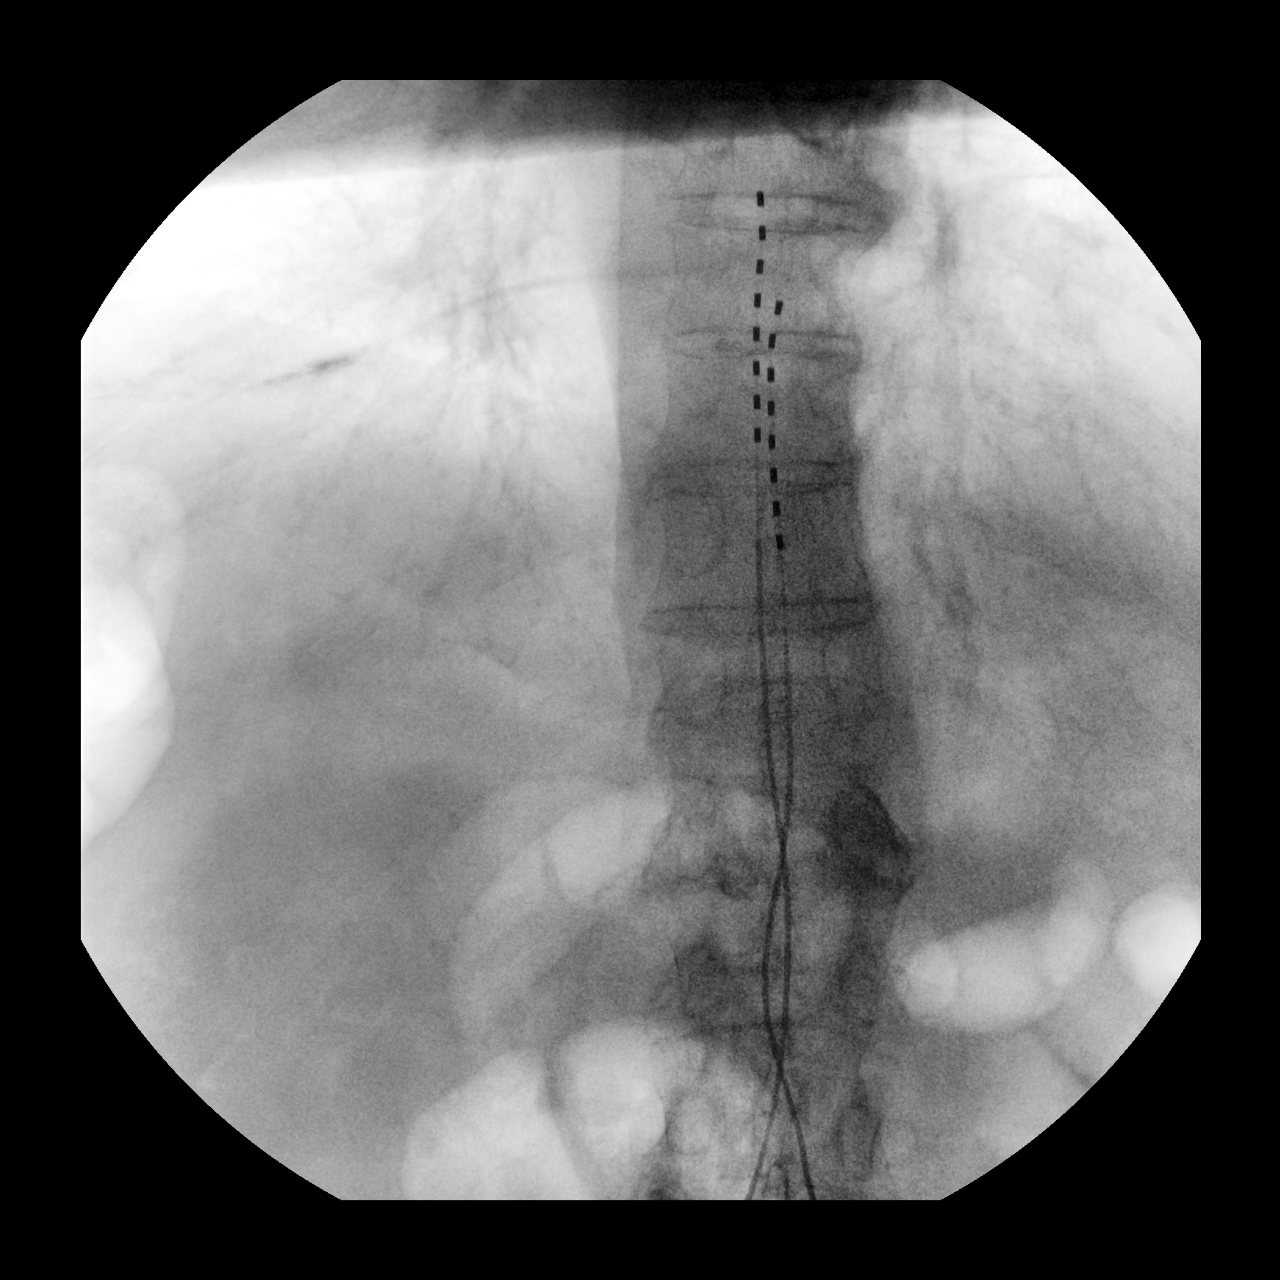
[im 2/2]
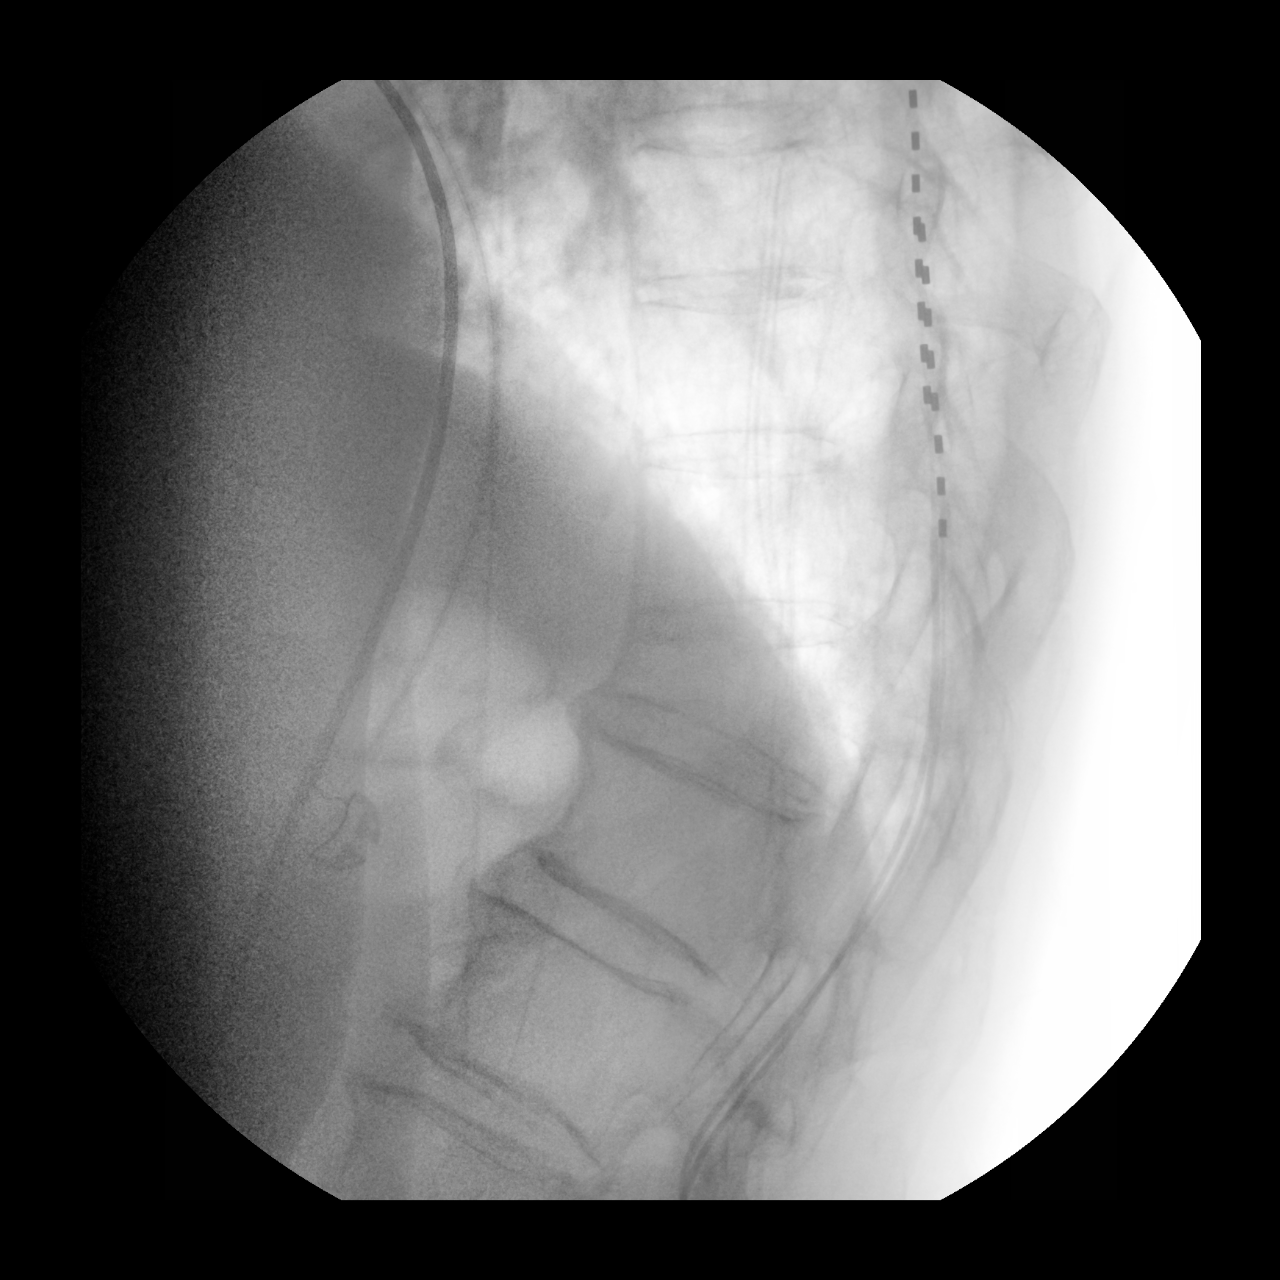

[2 of 2 positions shown; findings below may reference images not displayed]

FINDINGS: Two spot fluoroscopic images of the thoracic spine are provided for
review demonstrating two spinal stimulator devices overlying the
caudal aspect of the thoracic spine with superior tip overlying the
inferior endplate of the T7 vertebral body and inferior tip
overlying the inferior endplate of the T8 vertebral body (note,
spinal labeling is based in relation to chronic T11 vertebral body
compression deformity).
IMPRESSION: Post spinal stimulator lead placement as above.

## 2020-10-18 IMAGING — RF DG C-ARM 1-60 MIN
1 series · 2 of 2 positions shown · non-contrast
Comparison: CT abdomen pelvis-[DATE]

FLUOROSCOPY TIME:  6 minutes, 45 seconds (151 mGy)

CLINICAL DATA: Spinal cord stimulator implantation

EXAM:
THORACIC SPINE 2 VIEWS; DG C-ARM 1-60 MIN

[Series 1: dg x-ray · 0.20mm/px · 2 of 2 slices shown]
[im 1/2]
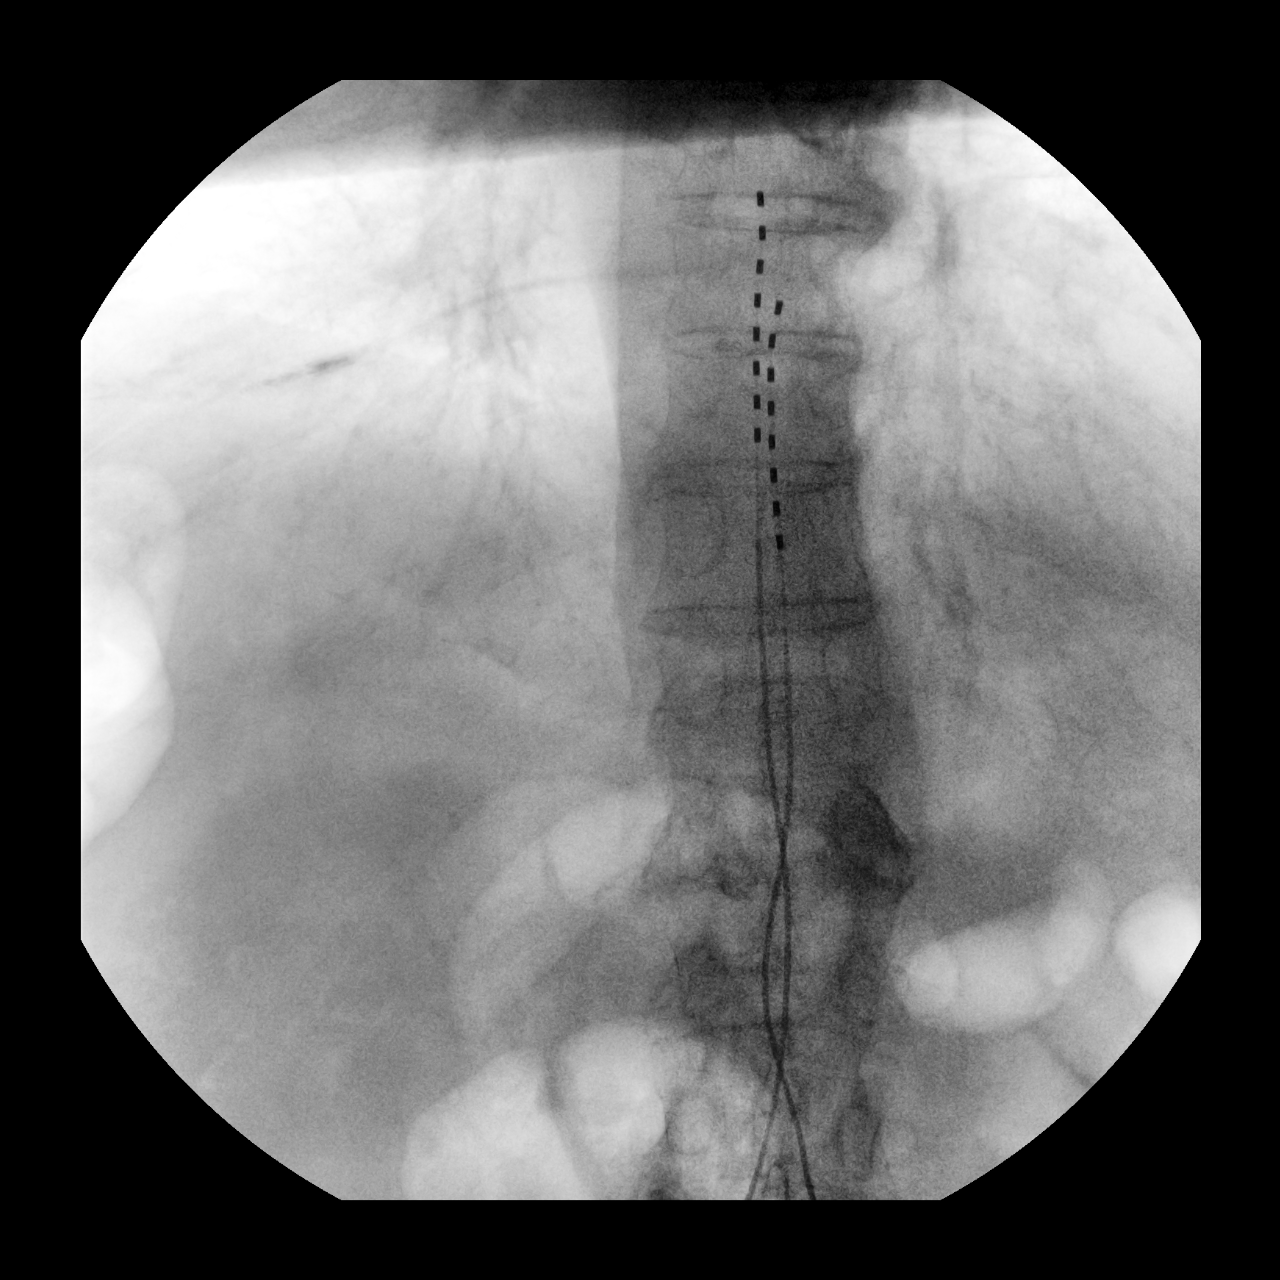
[im 2/2]
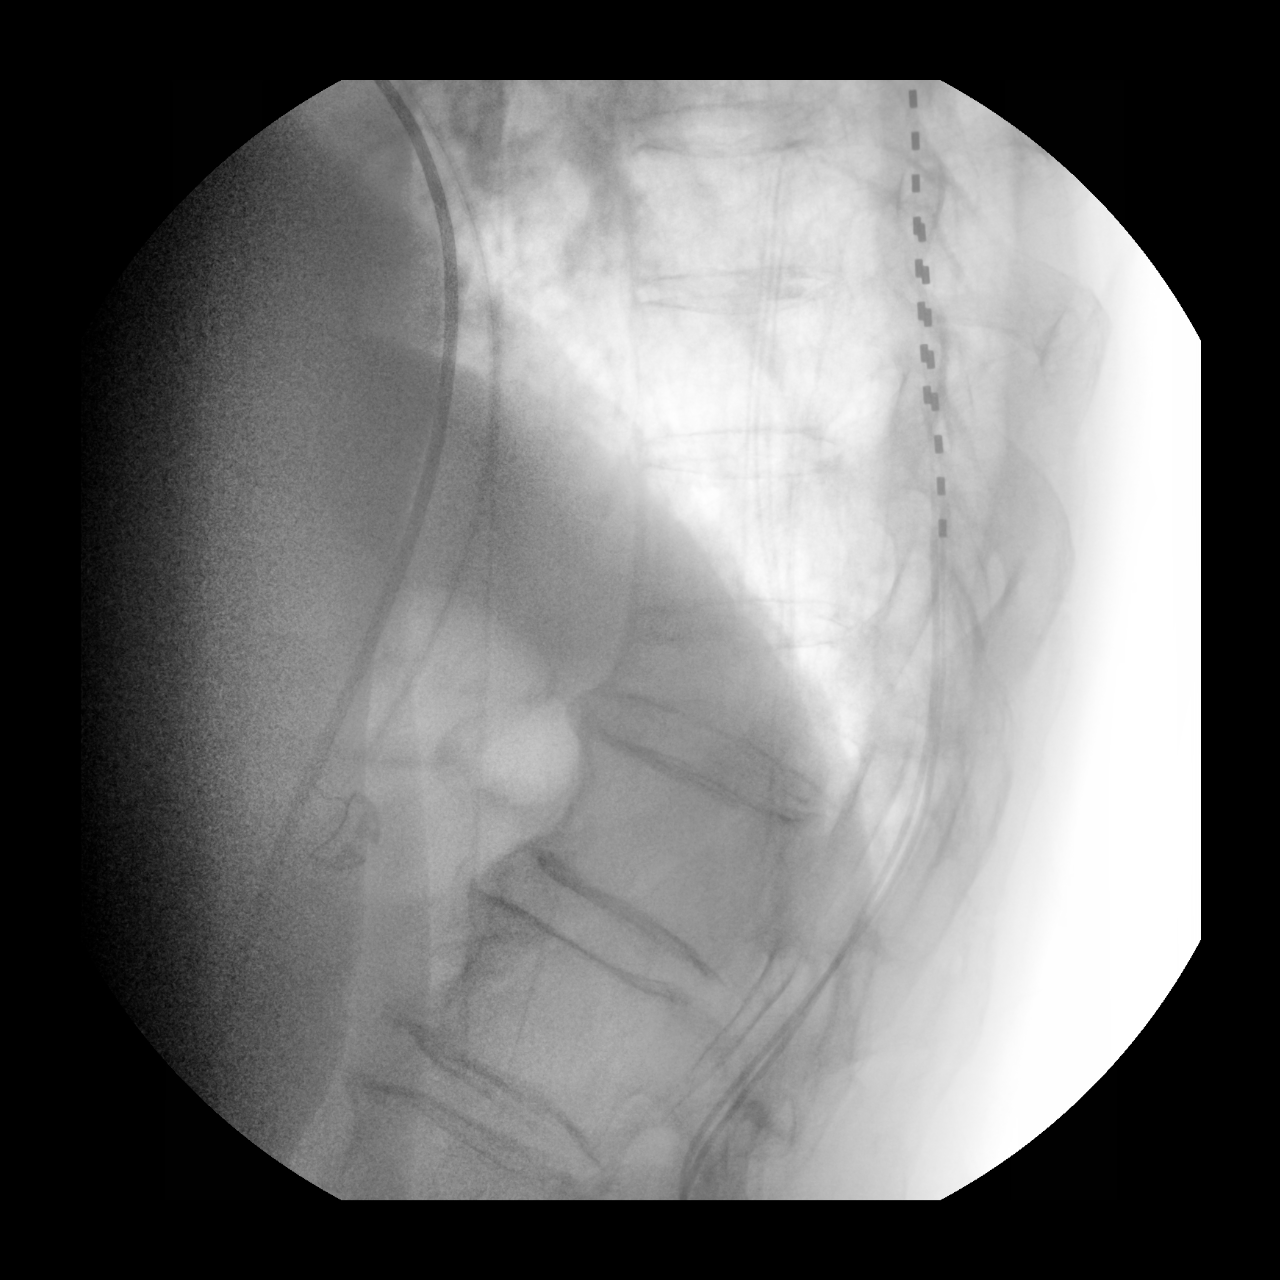

[2 of 2 positions shown; findings below may reference images not displayed]

FINDINGS: Two spot fluoroscopic images of the thoracic spine are provided for
review demonstrating two spinal stimulator devices overlying the
caudal aspect of the thoracic spine with superior tip overlying the
inferior endplate of the T7 vertebral body and inferior tip
overlying the inferior endplate of the T8 vertebral body (note,
spinal labeling is based in relation to chronic T11 vertebral body
compression deformity).
IMPRESSION: Post spinal stimulator lead placement as above.

## 2020-10-18 SURGERY — UNILATERAL PULSE GENERATOR IMPLANT
Anesthesia: General

## 2020-10-18 MED ORDER — TRAMADOL HCL 50 MG PO TABS
ORAL_TABLET | ORAL | Status: AC
  Administered 2020-10-18: 50 mg via ORAL
  Filled 2020-10-18: qty 1

## 2020-10-18 MED ORDER — THROMBIN (RECOMBINANT) 5000 UNITS EX SOLR
CUTANEOUS | Status: AC
  Filled 2020-10-18: qty 10000

## 2020-10-18 MED ORDER — ORAL CARE MOUTH RINSE: 15.0000 mL | Freq: Once | OROMUCOSAL | Status: AC

## 2020-10-18 MED ORDER — ACETAMINOPHEN 10 MG/ML IV SOLN
INTRAVENOUS | Status: DC | PRN
  Administered 2020-10-18: 1000 mg via INTRAVENOUS

## 2020-10-18 MED ORDER — VASOPRESSIN 20 UNIT/ML IV SOLN
INTRAVENOUS | Status: DC | PRN
  Administered 2020-10-18: 2 [IU] via INTRAVENOUS

## 2020-10-18 MED ORDER — SUCCINYLCHOLINE CHLORIDE 20 MG/ML IJ SOLN
INTRAMUSCULAR | Status: DC | PRN
  Administered 2020-10-18: 100 mg via INTRAVENOUS

## 2020-10-18 MED ORDER — CHLORHEXIDINE GLUCONATE 0.12 % MT SOLN
OROMUCOSAL | Status: AC
  Administered 2020-10-18: 15 mL via OROMUCOSAL
  Filled 2020-10-18: qty 15

## 2020-10-18 MED ORDER — BUPIVACAINE-EPINEPHRINE (PF) 0.5% -1:200000 IJ SOLN
INTRAMUSCULAR | Status: AC
  Filled 2020-10-18: qty 60

## 2020-10-18 MED ORDER — CEFAZOLIN SODIUM-DEXTROSE 2-4 GM/100ML-% IV SOLN
INTRAVENOUS | Status: AC
  Filled 2020-10-18: qty 100

## 2020-10-18 MED ORDER — ONDANSETRON HCL 4 MG/2ML IJ SOLN
INTRAMUSCULAR | Status: AC
  Filled 2020-10-18: qty 2

## 2020-10-18 MED ORDER — TRAMADOL HCL 50 MG PO TABS: 50.0000 mg | ORAL_TABLET | Freq: Three times a day (TID) | ORAL | 0 refills | Status: DC | PRN

## 2020-10-18 MED ORDER — ONDANSETRON HCL 4 MG/2ML IJ SOLN
INTRAMUSCULAR | Status: DC | PRN
  Administered 2020-10-18: 4 mg via INTRAVENOUS

## 2020-10-18 MED ORDER — EPHEDRINE SULFATE 50 MG/ML IJ SOLN
INTRAMUSCULAR | Status: DC | PRN
  Administered 2020-10-18: 5 mg via INTRAVENOUS

## 2020-10-18 MED ORDER — FAMOTIDINE 20 MG PO TABS: 20.0000 mg | ORAL_TABLET | Freq: Once | ORAL | Status: AC

## 2020-10-18 MED ORDER — ONDANSETRON HCL 4 MG/2ML IJ SOLN: 4.0000 mg | Freq: Once | INTRAMUSCULAR | Status: DC | PRN

## 2020-10-18 MED ORDER — REMIFENTANIL HCL 1 MG IV SOLR
INTRAVENOUS | Status: AC
  Filled 2020-10-18: qty 1000

## 2020-10-18 MED ORDER — REMIFENTANIL HCL 1 MG IV SOLR
INTRAVENOUS | Status: DC | PRN
  Administered 2020-10-18: .15 ug/kg/min via INTRAVENOUS

## 2020-10-18 MED ORDER — PROPOFOL 10 MG/ML IV BOLUS
INTRAVENOUS | Status: DC | PRN
  Administered 2020-10-18: 160 mg via INTRAVENOUS
  Administered 2020-10-18: 30 mg via INTRAVENOUS

## 2020-10-18 MED ORDER — FENTANYL CITRATE (PF) 100 MCG/2ML IJ SOLN
INTRAMUSCULAR | Status: DC | PRN
  Administered 2020-10-18: 50 ug via INTRAVENOUS

## 2020-10-18 MED ORDER — VANCOMYCIN HCL 1 G IV SOLR
INTRAVENOUS | Status: DC | PRN
  Administered 2020-10-18: 1000 mg via TOPICAL

## 2020-10-18 MED ORDER — PHENYLEPHRINE HCL (PRESSORS) 10 MG/ML IV SOLN
INTRAVENOUS | Status: DC | PRN
  Administered 2020-10-18 (×3): 100 ug via INTRAVENOUS

## 2020-10-18 MED ORDER — CHLORHEXIDINE GLUCONATE 0.12 % MT SOLN: 15.0000 mL | Freq: Once | OROMUCOSAL | Status: AC

## 2020-10-18 MED ORDER — BUPIVACAINE-EPINEPHRINE (PF) 0.5% -1:200000 IJ SOLN
INTRAMUSCULAR | Status: DC | PRN
  Administered 2020-10-18: 6 mL via PERINEURAL

## 2020-10-18 MED ORDER — FENTANYL CITRATE (PF) 100 MCG/2ML IJ SOLN
INTRAMUSCULAR | Status: AC
  Filled 2020-10-18: qty 2

## 2020-10-18 MED ORDER — ROCURONIUM BROMIDE 100 MG/10ML IV SOLN
INTRAVENOUS | Status: DC | PRN
  Administered 2020-10-18: 10 mg via INTRAVENOUS

## 2020-10-18 MED ORDER — PROPOFOL 1000 MG/100ML IV EMUL
INTRAVENOUS | Status: AC
  Filled 2020-10-18: qty 200

## 2020-10-18 MED ORDER — FAMOTIDINE 20 MG PO TABS
ORAL_TABLET | ORAL | Status: AC
  Administered 2020-10-18: 20 mg via ORAL
  Filled 2020-10-18: qty 1

## 2020-10-18 MED ORDER — LIDOCAINE HCL (CARDIAC) PF 100 MG/5ML IV SOSY
PREFILLED_SYRINGE | INTRAVENOUS | Status: DC | PRN
  Administered 2020-10-18: 100 mg via INTRAVENOUS

## 2020-10-18 MED ORDER — DEXAMETHASONE SODIUM PHOSPHATE 10 MG/ML IJ SOLN
INTRAMUSCULAR | Status: DC | PRN
  Administered 2020-10-18: 10 mg via INTRAVENOUS

## 2020-10-18 MED ORDER — LACTATED RINGERS IV SOLN: INTRAVENOUS | Status: DC

## 2020-10-18 MED ORDER — SUGAMMADEX SODIUM 200 MG/2ML IV SOLN
INTRAVENOUS | Status: DC | PRN
  Administered 2020-10-18: 200 mg via INTRAVENOUS

## 2020-10-18 MED ORDER — PROPOFOL 1000 MG/100ML IV EMUL
INTRAVENOUS | Status: AC
  Filled 2020-10-18: qty 300

## 2020-10-18 MED ORDER — MIDAZOLAM HCL 2 MG/2ML IJ SOLN
INTRAMUSCULAR | Status: AC
  Filled 2020-10-18: qty 2

## 2020-10-18 MED ORDER — GLYCOPYRROLATE 0.2 MG/ML IJ SOLN
INTRAMUSCULAR | Status: DC | PRN
  Administered 2020-10-18: .2 mg via INTRAVENOUS

## 2020-10-18 MED ORDER — PROPOFOL 500 MG/50ML IV EMUL
INTRAVENOUS | Status: DC | PRN
  Administered 2020-10-18: 165 ug/kg/min via INTRAVENOUS

## 2020-10-18 MED ORDER — TRAMADOL HCL 50 MG PO TABS
50.0000 mg | ORAL_TABLET | Freq: Once | ORAL | Status: AC
Start: 2020-10-18 — End: 2020-10-18

## 2020-10-18 MED ORDER — VANCOMYCIN HCL 1000 MG IV SOLR
INTRAVENOUS | Status: AC
  Filled 2020-10-18: qty 2000

## 2020-10-18 MED ORDER — CEFAZOLIN SODIUM-DEXTROSE 2-4 GM/100ML-% IV SOLN
2.0000 g | INTRAVENOUS | Status: AC
  Administered 2020-10-18: 2 g via INTRAVENOUS

## 2020-10-18 MED ORDER — FENTANYL CITRATE (PF) 100 MCG/2ML IJ SOLN
25.0000 ug | INTRAMUSCULAR | Status: DC | PRN
Start: 2020-10-18 — End: 2020-10-18

## 2020-10-18 SURGICAL SUPPLY — 78 items
ACC NRSTM SPNL CORD STM CHRNC (MISCELLANEOUS) ×1
ADH SKN CLS APL DERMABOND .7 (GAUZE/BANDAGES/DRESSINGS) ×1
AGENT HMST MTR 8 SURGIFLO (HEMOSTASIS)
APL PRP STRL LF DISP 70% ISPRP (MISCELLANEOUS) ×2
APL SRG 60D 8 XTD TIP BNDBL (TIP)
BLADE BOVIE TIP EXT 4 (BLADE) ×2 IMPLANT
BUR NEURO DRILL SOFT 3.0X3.8M (BURR) ×2 IMPLANT
CANISTER SUCT 1200ML W/VALVE (MISCELLANEOUS) ×2 IMPLANT
CHLORAPREP W/TINT 26 (MISCELLANEOUS) ×4 IMPLANT
CNTNR SPEC 2.5X3XGRAD LEK (MISCELLANEOUS) ×1
CONT SPEC 4OZ STER OR WHT (MISCELLANEOUS) ×1
CONT SPEC 4OZ STRL OR WHT (MISCELLANEOUS) ×1
CONTAINER SPEC 2.5X3XGRAD LEK (MISCELLANEOUS) ×1 IMPLANT
COUNTER NEEDLE 20/40 LG (NEEDLE) ×2 IMPLANT
COVER LIGHT HANDLE STERIS (MISCELLANEOUS) ×4 IMPLANT
COVER WAND RF STERILE (DRAPES) ×2 IMPLANT
CUP MEDICINE 2OZ PLAST GRAD ST (MISCELLANEOUS) ×2 IMPLANT
DERMABOND ADVANCED (GAUZE/BANDAGES/DRESSINGS) ×1
DERMABOND ADVANCED .7 DNX12 (GAUZE/BANDAGES/DRESSINGS) ×1 IMPLANT
DRAPE C-ARM 42X72 X-RAY (DRAPES) ×4 IMPLANT
DRAPE C-ARMOR (DRAPES) ×2 IMPLANT
DRAPE INCISE IOBAN 66X45 STRL (DRAPES) ×2 IMPLANT
DRAPE LAPAROTOMY 100X77 ABD (DRAPES) ×2 IMPLANT
DRAPE LAPAROTOMY 77X122 PED (DRAPES) ×2 IMPLANT
DRAPE SURG 17X11 SM STRL (DRAPES) ×2 IMPLANT
DRSG TEGADERM 4X4.75 (GAUZE/BANDAGES/DRESSINGS) ×4 IMPLANT
DRSG TELFA 4X3 1S NADH ST (GAUZE/BANDAGES/DRESSINGS) ×4 IMPLANT
DURASEAL APPLICATOR TIP (TIP) IMPLANT
DURASEAL SPINE SEALANT 3ML (MISCELLANEOUS) IMPLANT
ELECT CAUTERY BLADE TIP 2.5 (TIP) ×2
ELECT EZSTD 165MM 6.5IN (MISCELLANEOUS) ×2
ELECT REM PT RETURN 9FT ADLT (ELECTROSURGICAL) ×2
ELECTRODE CAUTERY BLDE TIP 2.5 (TIP) ×1 IMPLANT
ELECTRODE EZSTD 165MM 6.5IN (MISCELLANEOUS) ×1 IMPLANT
ELECTRODE REM PT RTRN 9FT ADLT (ELECTROSURGICAL) ×1 IMPLANT
ENVELOPE ABSORB ANTIBACTERIAL (Mesh General) ×2 IMPLANT
FEE INTRAOP MONITOR IMPULS NCS (MISCELLANEOUS) ×2 IMPLANT
GAUZE SPONGE 4X4 12PLY STRL (GAUZE/BANDAGES/DRESSINGS) ×2 IMPLANT
GLOVE SRG 8 PF TXTR STRL LF DI (GLOVE) ×1 IMPLANT
GLOVE SURG SYN 7.0 (GLOVE) ×4 IMPLANT
GLOVE SURG SYN 8.0 (GLOVE) ×4 IMPLANT
GLOVE SURG UNDER POLY LF SZ7 (GLOVE) ×2 IMPLANT
GLOVE SURG UNDER POLY LF SZ8 (GLOVE) ×2
GOWN STRL REUS W/ TWL XL LVL3 (GOWN DISPOSABLE) ×2 IMPLANT
GOWN STRL REUS W/TWL XL LVL3 (GOWN DISPOSABLE) ×4
GRADUATE 1200CC STRL 31836 (MISCELLANEOUS) ×2 IMPLANT
INTRAOP MONITOR FEE IMPULS NCS (MISCELLANEOUS) ×2
INTRAOP MONITOR FEE IMPULSE (MISCELLANEOUS) ×2
KIT LEAD (Lead) ×4 IMPLANT
KIT TURNOVER KIT A (KITS) ×2 IMPLANT
KIT WILSON FRAME (KITS) ×2 IMPLANT
MANIFOLD NEPTUNE II (INSTRUMENTS) ×2 IMPLANT
MARKER SKIN DUAL TIP RULER LAB (MISCELLANEOUS) ×4 IMPLANT
NDL SAFETY ECLIPSE 18X1.5 (NEEDLE) ×1 IMPLANT
NEEDLE HYPO 18GX1.5 SHARP (NEEDLE) ×2
NEEDLE HYPO 22GX1.5 SAFETY (NEEDLE) ×2 IMPLANT
NS IRRIG 1000ML POUR BTL (IV SOLUTION) ×2 IMPLANT
PACK LAMINECTOMY NEURO (CUSTOM PROCEDURE TRAY) ×2 IMPLANT
PAD ARMBOARD 7.5X6 YLW CONV (MISCELLANEOUS) ×4 IMPLANT
RECHARGER VANTA TM NEURO STIM (MISCELLANEOUS) ×2 IMPLANT
REPROGRAMMER VANTA TM STIM (KITS) ×2 IMPLANT
SPOGE SURGIFLO 8M (HEMOSTASIS)
SPONGE SURGIFLO 8M (HEMOSTASIS) IMPLANT
STAPLER SKIN PROX 35W (STAPLE) IMPLANT
SUT ETHILON 3-0 FS-10 30 BLK (SUTURE) ×4
SUT POLYSORB 2-0 5X18 GS-10 (SUTURE) ×10 IMPLANT
SUT SILK 2 0 PERMA HAND 18 BK (SUTURE) ×2 IMPLANT
SUT SILK 2 0 SH (SUTURE) ×4 IMPLANT
SUT VIC AB 0 CT1 18XCR BRD 8 (SUTURE) ×2 IMPLANT
SUT VIC AB 0 CT1 8-18 (SUTURE) ×4
SUTURE EHLN 3-0 FS-10 30 BLK (SUTURE) ×2 IMPLANT
SYR 10ML LL (SYRINGE) ×4 IMPLANT
SYR 20ML LL LF (SYRINGE) ×2 IMPLANT
SYR 30ML LL (SYRINGE) ×4 IMPLANT
SYR 3ML LL SCALE MARK (SYRINGE) ×2 IMPLANT
SYR BULB IRRIG 60ML STRL (SYRINGE) ×2 IMPLANT
TOWEL OR 17X26 4PK STRL BLUE (TOWEL DISPOSABLE) ×4 IMPLANT
TUBING CONNECTING 10 (TUBING) ×2 IMPLANT

## 2020-10-18 NOTE — Interval H&P Note (Signed)
History and Physical Interval Note: Mr Christopher [name] has had the stimulator trial for a week and is getting greater than 75% relief of his pain. Given this, he would like to proceed with permanent implant     10/18/2020 6:49 AM  Christopher [name]  has presented today for surgery, with the diagnosis of chronic pain syndrome g89.4.  The various methods of treatment have been discussed with the patient and family. After consideration of risks, benefits and other options for treatment, the patient has consented to  Procedure(s): UNILATERAL PULSE GENERATOR IMPLANT (N/A) PERCUTANEOUS LEAD PLACEMENT (N/A) as a surgical intervention.  The patient's history has been reviewed, patient examined, no change in status, stable for surgery.  I have reviewed the patient's chart and labs.  Questions were answered to the patient's satisfaction.     [name]

## 2020-10-18 NOTE — Op Note (Signed)
Operative Note   SURGERY DATE:  10/18/2020   PRE-OP DIAGNOSIS:  Chronic pain syndrome   POST-OP DIAGNOSIS: Post-Op Diagnosis Codes: Chronic pain syndrome   Procedure(s) with comments: Percutaneous thoracic spinal cord stimulator lead placement Right flank pulse generator placement  SURGEON:     Malen Gauze, MD     ANESTHESIA: General    OPERATIVE FINDINGS: Successful placement of thoracic spinal cord stimulator and pulse generator, Removal of trial leads   Indication Christopher [name] underwent a thoracic SCS trial on 4/4 and did well with great relief of pain and more mobility. The patient wished to proceed to permanent implant to decrease medication use and achieve better pain control. Risks including damage to spinal cord, weakness, hematoma, infection, failure of pain relief, post-operative pain, need for revision, stroke, heart attack, pneumonia, and spinal cord injury were discussed.    Procedure The patient was brought to the operating room where vascular access was obtained andhe was intubated by the anesthesia service. Neuro monitoring electrodes were placed and baseline SSEPs were normal. He wasplaced prone on gel rolls. Antibiotics were given.Fluoroscopy was used to confirm planned incision inlumbar area at the level of L3/4in the midline. An addition incision was planned in therightflank for the pulse generator placement. The patient was prepped and draped in a sterile fashion. A hard time out was performed. The trial leads were removed sterilely. Local anesthetic was instilled intoplannedincisionsites.   The midline lumbar incision was opened and taken to the fascia using cautery.Next, a Touhy needle was used to insert in a paramedian approach to enter the interlaminar space between L1 and L2. Once loss of resistance was identified, this was confirmed with a metal stylette. Next, the percutaneous lead was passed in the rostral direction using fluoroscopy as  guidance to keep in the midline. This was passed without resistance to the level of the T8vertebral bodyon the left. Lateral views were obtained to confirm we were in the dorsal epidural space. Next, the same procedure was performed contralaterally. We placedan additional lead on the right at T8/9, slightly offset to the previous lead to allow for better coverage.The leads were secured with anchors in the fascia. The incision was irrigated and hemostasis obtained.  Next, the rightflank incision was opened and taken to the fascia and inferior dissection to allow for a large enough pocket for the placement of the battery. The incision was irrigated and hemostasis obtained. Next, a tunneler was used to pass the electrode leads from the lumbar incision to theleftflank. There, the electrodes were attached to the pulse generator and impedances were found to be normal. Monitoring confirmed stimulation in lower extremities. The pulse generator was placed into antibiotic pouch and then in the incision taking care to place the wires beneath the pulse generator.  A final fluoroscopic image was taken show good placement ofpercutaneous leads. SSEPS remained stable throughout the case.The battery and wires were secured to fascia with suture. Vancomycin powder was placed into the incisions. Next, acombinationof 2-0 Vicryls were used to close the incisions withNylonon the skinin flank and  lumbar midline.  Sterile dressings were applied. The patient was returned to supine positionand the patient was seen to be moving all extremities symmetrically and was taken to PACU for recovery. The family was updated and all questions answered.   ESTIMATED BLOOD LOSS:   10 cc   SPECIMENS None   IMPLANT KIT LEAD - [id number]  Inventory Item: KIT LEAD Serial no.:  Model/Cat no.: [id number]  Implant name:  KIT LEAD - [id number] Laterality: N/A Area: Spine Thoracic  Manufacturer: [employer] [place] [employer] Date  of Manufacture:    Action: Implanted Number Used: 1   Device Identifier:  Device Identifier Type:     LEAD KIT TRAIL 90CM - [id number]  Inventory Item: LEAD KIT TRAIL 90CM Serial no.:  Model/Cat no.: [id number]  Implant name: LEAD KIT TRAIL 90CM - [id number] Laterality: N/A Area: Back   Manufacturer: MEDTRONIC NEUROMOD PAIN MGMT Date of Manufacture:    Action: Explanted Number Used: 1   Device Identifier:  Device Identifier Type:     LEAD KIT TRAIL 90CM - [id number]  Inventory Item: LEAD KIT TRAIL 90CM Serial no.:  Model/Cat no.: [id number]  Implant name: LEAD KIT TRAIL 90CM - [id number] Laterality: N/A Area: Back   Manufacturer: MEDTRONIC NEUROMOD PAIN MGMT Date of Manufacture:    Action: Explanted Number Used: 1   Device Identifier:  Device Identifier Type:     KIT LEAD - [id number]  Inventory Item: KIT LEAD Serial no.:  Model/Cat no.: [id number]  Implant name: KIT LEAD - [id number] Laterality: N/A Area: Spine Thoracic  Manufacturer: [employer] [place] [employer] Date of Manufacture:    Action: Implanted Number Used: 1   Device Identifier:  Device Identifier Type:     ENVELOPE ABSORB ANTIBACTERIAL - [id number]  Inventory Item: ENVELOPE ABSORB ANTIBACTERIAL Serial no.:  Model/Cat no.: [id number]  Implant name: ENVELOPE ABSORB ANTIBACTERIAL - [id number] Laterality: N/A Area: Spine Thoracic  Manufacturer: [employer] [place] Date of Manufacture:    Action: Implanted Number Used: 1   Device Identifier:  Device Identifier Type:     Vanta Adaptive Stim  Inventory Item:  Serial no.: [id number] Model/Cat no.: [id number]  Implant name: Vanta Adaptive Stim Laterality: N/A Area: Spine Thoracic  Manufacturer:  Date of Manufacture:    Action: Implanted Number Used: 1   Device Identifier:  Device Identifier Type:          I performed the case in its entirety    [name], [name]

## 2020-10-18 NOTE — Anesthesia Procedure Notes (Signed)
Procedure Name: Intubation Performed by: Fletcher-Harrison, Asar Evilsizer, CRNA Pre-anesthesia Checklist: Patient identified, Emergency Drugs available, Suction available and Patient being monitored Patient Re-evaluated:Patient Re-evaluated prior to induction Oxygen Delivery Method: Circle system utilized Preoxygenation: Pre-oxygenation with 100% oxygen Induction Type: IV induction Ventilation: Mask ventilation without difficulty Laryngoscope Size: McGraph and 3 Grade View: Grade I Tube type: Oral Tube size: 7.0 mm Number of attempts: 1 Airway Equipment and Method: Stylet and Oral airway Placement Confirmation: ETT inserted through vocal cords under direct vision,  positive ETCO2,  breath sounds checked- equal and bilateral and CO2 detector Secured at: 21 cm Tube secured with: Tape Dental Injury: Teeth and Oropharynx as per pre-operative assessment        

## 2020-10-18 NOTE — Transfer of Care (Signed)
Immediate Anesthesia Transfer of Care Note  Patient: [name]  Procedure(s) Performed: UNILATERAL PULSE GENERATOR IMPLANT (N/A ) PERCUTANEOUS LEAD PLACEMENT (N/A )  Patient Location: PACU  Anesthesia Type:General  Level of Consciousness: drowsy and patient cooperative  Airway & Oxygen Therapy: Patient Spontanous Breathing and Patient connected to face mask oxygen  Post-op Assessment: Report given to RN and Post -op Vital signs reviewed and stable  Post vital signs: Reviewed and stable  Last Vitals:  Vitals Value Taken Time  BP 172/85 10/18/20 0931  Temp    Pulse 64 10/18/20 0935  Resp 13 10/18/20 0935  SpO2 100 % 10/18/20 0935  Vitals shown include unvalidated device data.  Last Pain:  Vitals:   10/18/20 5364  TempSrc: Oral  PainSc: 1          Complications: No complications documented.

## 2020-10-18 NOTE — Anesthesia Preprocedure Evaluation (Signed)
Anesthesia Evaluation  Patient identified by MRN, date of birth, ID band Patient awake    Reviewed: Allergy & Precautions, NPO status , Patient's Chart, lab work & pertinent test results  History of Anesthesia Complications Negative for: history of anesthetic complications  Airway Mallampati: III       Dental   Pulmonary neg sleep apnea, neg COPD, Not current smoker,           Cardiovascular hypertension, Pt. on medications (-) Past MI and (-) CHF + dysrhythmias Atrial Fibrillation + pacemaker      Neuro/Psych neg Seizures    GI/Hepatic Neg liver ROS, neg GERD  ,  Endo/Other  neg diabetes  Renal/GU negative Renal ROS     Musculoskeletal   Abdominal   Peds  Hematology  (+) anemia ,   Anesthesia Other Findings   Reproductive/Obstetrics                             Anesthesia Physical Anesthesia Plan  ASA: III  Anesthesia Plan: General   Post-op Pain Management:    Induction:   PONV Risk Score and Plan: 2 and Ondansetron and Dexamethasone  Airway Management Planned: Oral ETT  Additional Equipment:   Intra-op Plan:   Post-operative Plan:   Informed Consent: I have reviewed the patients History and Physical, chart, labs and discussed the procedure including the risks, benefits and alternatives for the proposed anesthesia with the patient or authorized representative who has indicated his/her understanding and acceptance.       Plan Discussed with:   Anesthesia Plan Comments:         Anesthesia Quick Evaluation

## 2020-10-18 NOTE — OR Nursing (Signed)
Per Dr. Lacinda Axon (OR 3 staff) - pt to resume wearing his back brace; applied prior to discharge from postop.

## 2020-10-18 NOTE — Anesthesia Postprocedure Evaluation (Signed)
Anesthesia Post Note  Patient: Christopher [name]  Procedure(s) Performed: UNILATERAL PULSE GENERATOR IMPLANT (N/A ) PERCUTANEOUS LEAD PLACEMENT (N/A )  Patient location during evaluation: PACU Anesthesia Type: General Level of consciousness: awake and [name] Pain management: pain level controlled Vital Signs Assessment: post-procedure vital signs reviewed and stable Respiratory status: spontaneous breathing and respiratory function stable Cardiovascular status: stable Anesthetic complications: no   No complications documented.   Last Vitals:  Vitals:   10/18/20 1036 10/18/20 1048  BP: (!) 148/63 (!) 144/76  Pulse: 62 60  Resp: 16 16  Temp: (!) 36.3 C   SpO2: 100% 100%    Last Pain:  Vitals:   10/18/20 1048  TempSrc:   PainSc: 3                  Billy Turvey K

## 2020-10-18 NOTE — Discharge Instructions (Addendum)
NEUROSURGERY DISCHARGE INSTRUCTIONS  Admission diagnosis: chronic pain syndrome g89.4  Operative procedure: Thoracic spinal cord stimulator and pulse generator  What to do after you leave the hospital:  Recommended diet: cardiac diet. Increase protein intake to promote wound healing.  Recommended activity: no lifting, bending, rotating, or strenuous exercise for 6 weeks. You should walk multiple times per day   Please restart Eliquis 5 days after surgery, on 10/23/2020  Special Instructions  No straining, no heavy lifting > 10lbs x 6 weeks.  Keep incision areas clean and dry. May remove dressings shower in 2 days. No baths or pools for 6 weeks. Please remove dressing in 2 days, no need to apply a bandage afterwards  You have sutures that will be removed in clinic.   Please take pain medications as directed. Take a stool softener if on pain medications   Please Report any of the following: Nausea or Vomiting, Temperature is greater than 101.13F (38.1C) degrees, Dizziness, Abdominal Pain, Difficulty Breathing or Shortness of Breath, Inability to Eat, drink Fluids, or Take medications, Bleeding, swelling, or drainage from surgical incision sites, New numbness or weakness, and Bowel or bladder dysfunction to the neurosurgeon on call at [phone]  Additional Follow up appointments Please follow up with Dr Lacinda Axon in [place] clinic as scheduled in 2-3 weeks   Please see below for scheduled appointments:  Future Appointments  Date Time Provider [place]  12/28/2020  1:15 PM [facility] None  12/28/2020  1:45 PM Cammie Sickle, MD [facility] None  12/28/2020  2:15 PM [facility] INFUSION CHAIR 3 [facility] None  02/17/2021 10:15 AM Ralene Bathe, MD [facility] None    AMBULATORY SURGERY  DISCHARGE INSTRUCTIONS   1) The drugs that you were given will stay in your system until tomorrow so for the next 24 hours you should not:  A) Drive an  automobile B) Make any legal decisions C) Drink any alcoholic beverage   2) You may resume regular meals tomorrow.  Today it is better to start with liquids and gradually work up to solid foods.  You may eat anything you prefer, but it is better to start with liquids, then soup and crackers, and gradually work up to solid foods.   3) Please notify your doctor immediately if you have any unusual bleeding, trouble breathing, redness and pain at the surgery site, drainage, fever, or pain not relieved by medication.    4) Additional Instructions:        Please contact your physician with any problems or Same Day Surgery at [phone], Monday through Friday 6 am to 4 pm, or [place] [facility] number at [phone].

## 2020-10-18 NOTE — OR Nursing (Signed)
Per Dr. Lacinda Axon (OR room staff) - patient does not need to void prior to discharge; also, patient can resume taking Eliquis in 5 days.  Added info on eliquis resumption to d/c instructions/med section.

## 2020-10-18 NOTE — Progress Notes (Signed)
PHARMACY -  BRIEF ANTIBIOTIC NOTE   Pharmacy has received consult(s) for Cefazolin from an OR provider.  The patient's profile has been reviewed for ht/wt/allergies/indication/available labs.    Preop order placed for Cefazolin 2 gm per pt wt: 91.4 kg  Further antibiotics/pharmacy consults should be ordered by admitting physician if indicated.                       Renda Rolls, PharmD, [facility] 10/18/2020 6:30 AM

## 2020-10-19 ENCOUNTER — Encounter: Payer: Self-pay | Admitting: Neurosurgery

## 2020-10-20 ENCOUNTER — Other Ambulatory Visit: Payer: Self-pay | Admitting: Urology

## 2020-11-29 ENCOUNTER — Other Ambulatory Visit: Payer: Self-pay | Admitting: Urology

## 2020-11-30 ENCOUNTER — Ambulatory Visit: Payer: Medicare HMO | Admitting: Dermatology

## 2020-11-30 ENCOUNTER — Other Ambulatory Visit: Payer: Self-pay

## 2020-11-30 DIAGNOSIS — C44622 Squamous cell carcinoma of skin of right upper limb, including shoulder: Secondary | ICD-10-CM

## 2020-11-30 DIAGNOSIS — D485 Neoplasm of uncertain behavior of skin: Secondary | ICD-10-CM

## 2020-11-30 DIAGNOSIS — L578 Other skin changes due to chronic exposure to nonionizing radiation: Secondary | ICD-10-CM | POA: Diagnosis not present

## 2020-11-30 NOTE — Patient Instructions (Addendum)
Electrodesiccation and Curettage ("Scrape and Burn") Wound Care Instructions  1. Leave the original bandage on for 24 hours if possible.  If the bandage becomes soaked or soiled before that time, it is OK to remove it and examine the wound.  A small amount of post-operative bleeding is normal.  If excessive bleeding occurs, remove the bandage, place gauze over the site and apply continuous pressure (no peeking) over the area for 30 minutes. If this does not work, please call our clinic as soon as possible or page your doctor if it is after hours.   2. Once a day, cleanse the wound with soap and water. It is fine to shower. If a thick crust develops you may use a Q-tip dipped into dilute hydrogen peroxide (mix 1:1 with water) to dissolve it.  Hydrogen peroxide can slow the healing process, so use it only as needed.    3. After washing, apply petroleum jelly (Vaseline) or an antibiotic ointment if your doctor prescribed one for you, followed by a bandage.    4. For best healing, the wound should be covered with a layer of ointment at all times. If you are not able to keep the area covered with a bandage to hold the ointment in place, this may mean re-applying the ointment several times a day.  Continue this wound care until the wound has healed and is no longer open. It may take several weeks for the wound to heal and close.  Itching and mild discomfort is normal during the healing process.  If you have any discomfort, you can take Tylenol (acetaminophen) or ibuprofen as directed on the bottle. (Please do not take these if you have an allergy to them or cannot take them for another reason).  Some redness, tenderness and white or yellow material in the wound is normal healing.  If the area becomes very sore and red, or develops a thick yellow-green material (pus), it may be infected; please notify us.    Wound healing continues for up to one year following surgery. It is not unusual to experience pain  in the scar from time to time during the interval.  If the pain becomes severe or the scar thickens, you should notify the office.    A slight amount of redness in a scar is expected for the first six months.  After six months, the redness will fade and the scar will soften and fade.  The color difference becomes less noticeable with time.  If there are any problems, return for a post-op surgery check at your earliest convenience.  To improve the appearance of the scar, you can use silicone scar gel, cream, or sheets (such as Mederma or Serica) every night for up to one year. These are available over the counter (without a prescription).  Please call our office at [phone] for any questions or concerns.  If you have any questions or concerns for your doctor, please call our main line at [phone] and press option 4 to reach your doctor's medical assistant. If no one answers, please leave a voicemail as directed and we will return your call as soon as possible. Messages left after 4 pm will be answered the following business day.   You may also send [place] a message via [place]. We typically respond to MyChart messages within 1-2 business days.  For prescription refills, please ask your pharmacy to contact our office. Our fax number is [phone].  If you have an urgent issue when the  clinic is closed that cannot wait until the next business day, you can page your doctor at the number below.    Please note that while we do our best to be available for urgent issues outside of office hours, we are not available 24/7.   If you have an urgent issue and are unable to reach [place], you may choose to seek medical care at your doctor's office, retail clinic, urgent care center, or emergency room.  If you have a medical emergency, please immediately call 911 or go to the emergency department.  Pager Numbers  - Dr. Nehemiah Massed: [phone]  - Dr. Laurence Ferrari: [phone]  - Dr. Nicole Kindred:  [phone]  In the event of inclement weather, please call our main line at [phone] for an update on the status of any delays or closures.  Dermatology Medication Tips: Please keep the boxes that topical medications come in in order to help keep track of the instructions about where and how to use these. Pharmacies typically print the medication instructions only on the boxes and not directly on the medication tubes.   If your medication is too expensive, please contact our office at [phone] option 4 or send [place] a message through [place].   We are unable to tell what your co-pay for medications will be in advance as this is different depending on your insurance coverage. However, we may be able to find a substitute medication at lower cost or fill out paperwork to get insurance to cover a needed medication.   If a prior authorization is required to get your medication covered by your insurance company, please allow [place] 1-2 business days to complete this process.  Drug prices often vary depending on where the prescription is filled and some pharmacies may offer cheaper prices.  The website www.goodrx.com contains coupons for medications through different pharmacies. The prices here do not account for what the cost may be with help from insurance (it may be cheaper with your insurance), but the website can give you the price if you did not use any insurance.  - You can print the associated coupon and take it with your prescription to the pharmacy.  - You may also stop by our office during regular business hours and pick up a GoodRx coupon card.  - If you need your prescription sent electronically to a different pharmacy, notify our office through [facility] or by phone at [phone] option 4.

## 2020-11-30 NOTE — Progress Notes (Signed)
   Follow-Up Visit   Subjective  Christopher [name] is a 87 y.o. male who presents for the following: Growth (Of the right elbow - new, larger, and irregular. Patient is concerned and would like lesion checked).  The following portions of the chart were reviewed this encounter and updated as appropriate:   Tobacco  Allergies  Meds  Problems  Med Hx  Surg Hx  Fam Hx     Review of Systems:  No other skin or systemic complaints except as noted in HPI or Assessment and Plan.  Objective  Well appearing patient in no apparent distress; mood and affect are within normal limits.  A focused examination was performed including R arm . Relevant physical exam findings are noted in the Assessment and Plan.  Objective  R elbow: 2.2 cm hyperkeratotic papule    Assessment & Plan  Neoplasm of uncertain behavior of skin R elbow  Epidermal / dermal shaving  Lesion diameter (cm):  2.2 Informed consent: discussed and consent obtained   Timeout: patient name, date of birth, surgical site, and procedure verified   Procedure prep:  Patient was prepped and draped in usual sterile fashion Prep type:  Isopropyl alcohol Anesthesia: the lesion was anesthetized in a standard fashion   Anesthetic:  1% lidocaine w/ epinephrine 1-100,000 buffered w/ 8.4% NaHCO3 Instrument used: flexible razor blade   Hemostasis achieved with: pressure, aluminum chloride and electrodesiccation   Outcome: patient tolerated procedure well   Post-procedure details: sterile dressing applied and wound care instructions given   Dressing type: bandage and petrolatum    Destruction of lesion Complexity: extensive   Destruction method: electrodesiccation and curettage   Informed consent: discussed and consent obtained   Timeout:  patient name, date of birth, surgical site, and procedure verified Procedure prep:  Patient was prepped and draped in usual sterile fashion Prep type:  Isopropyl alcohol Anesthesia: the lesion was  anesthetized in a standard fashion   Anesthetic:  1% lidocaine w/ epinephrine 1-100,000 buffered w/ 8.4% NaHCO3 Curettage performed in three different directions: Yes   Electrodesiccation performed over the curetted area: Yes   Lesion length (cm):  2.2 Lesion width (cm):  2.2 Margin per side (cm):  0.2 Final wound size (cm):  2.6 Hemostasis achieved with:  pressure, aluminum chloride and electrodesiccation Outcome: patient tolerated procedure well with no complications   Post-procedure details: sterile dressing applied and wound care instructions given   Dressing type: bandage and petrolatum    Specimen 1 - Surgical pathology Differential Diagnosis: D48.5 r/o SCC  ED&C today  Check Margins: No 2.2 cm hyperkeratotic papule  Actinic Damage - chronic, secondary to cumulative UV radiation exposure/sun exposure over time - diffuse scaly erythematous macules with underlying dyspigmentation - Recommend daily broad spectrum sunscreen SPF 30+ to sun-exposed areas, reapply every 2 hours as needed.  - Recommend staying in the shade or wearing long sleeves, sun glasses (UVA+UVB protection) and wide brim hats (4-inch brim around the entire circumference of the hat). - Call for new or changing lesions.  Return for appointment as scheduled.  Luther Redo, CMA, am acting as scribe for Sarina Ser, MD .  Documentation: I have reviewed the above documentation for accuracy and completeness, and I agree with the above.  Sarina Ser, MD

## 2020-12-04 ENCOUNTER — Encounter: Payer: Self-pay | Admitting: Dermatology

## 2020-12-07 ENCOUNTER — Telehealth: Payer: Self-pay

## 2020-12-07 NOTE — Telephone Encounter (Signed)
-----   Message from Ralene Bathe, MD sent at 12/02/2020  5:02 PM EDT ----- Diagnosis Skin , right elbow SQUAMOUS CELL CARCINOMA, KERATOACANTHOMA TYPE  Cancer - SCC Already treated Recheck next visit

## 2020-12-07 NOTE — Telephone Encounter (Signed)
Advised pt of bx results/sh ?

## 2020-12-28 ENCOUNTER — Inpatient Hospital Stay: Payer: Medicare HMO

## 2020-12-28 ENCOUNTER — Inpatient Hospital Stay: Payer: Medicare HMO | Admitting: Internal Medicine

## 2020-12-28 ENCOUNTER — Inpatient Hospital Stay: Payer: Medicare HMO | Attending: Internal Medicine

## 2020-12-28 ENCOUNTER — Encounter: Payer: Self-pay | Admitting: Internal Medicine

## 2020-12-28 DIAGNOSIS — I4891 Unspecified atrial fibrillation: Secondary | ICD-10-CM | POA: Diagnosis not present

## 2020-12-28 DIAGNOSIS — N400 Enlarged prostate without lower urinary tract symptoms: Secondary | ICD-10-CM | POA: Diagnosis not present

## 2020-12-28 DIAGNOSIS — I10 Essential (primary) hypertension: Secondary | ICD-10-CM | POA: Diagnosis not present

## 2020-12-28 DIAGNOSIS — E785 Hyperlipidemia, unspecified: Secondary | ICD-10-CM | POA: Insufficient documentation

## 2020-12-28 DIAGNOSIS — M8580 Other specified disorders of bone density and structure, unspecified site: Secondary | ICD-10-CM | POA: Insufficient documentation

## 2020-12-28 DIAGNOSIS — Z8551 Personal history of malignant neoplasm of bladder: Secondary | ICD-10-CM | POA: Insufficient documentation

## 2020-12-28 DIAGNOSIS — K5900 Constipation, unspecified: Secondary | ICD-10-CM | POA: Insufficient documentation

## 2020-12-28 DIAGNOSIS — D500 Iron deficiency anemia secondary to blood loss (chronic): Secondary | ICD-10-CM | POA: Insufficient documentation

## 2020-12-28 DIAGNOSIS — C679 Malignant neoplasm of bladder, unspecified: Secondary | ICD-10-CM | POA: Insufficient documentation

## 2020-12-28 DIAGNOSIS — R918 Other nonspecific abnormal finding of lung field: Secondary | ICD-10-CM | POA: Insufficient documentation

## 2020-12-28 DIAGNOSIS — Z79899 Other long term (current) drug therapy: Secondary | ICD-10-CM | POA: Insufficient documentation

## 2020-12-28 LAB — CBC WITH DIFFERENTIAL/PLATELET
Abs Immature Granulocytes: 0.03 10*3/uL (ref 0.00–0.07)
Basophils Absolute: 0 10*3/uL (ref 0.0–0.1)
Basophils Relative: 1 %
Eosinophils Absolute: 0.2 10*3/uL (ref 0.0–0.5)
Eosinophils Relative: 3 %
HCT: 39.3 % (ref 39.0–52.0)
Hemoglobin: 13.1 g/dL (ref 13.0–17.0)
Immature Granulocytes: 1 %
Lymphocytes Relative: 14 %
Lymphs Abs: 0.8 10*3/uL (ref 0.7–4.0)
MCH: 32.6 pg (ref 26.0–34.0)
MCHC: 33.3 g/dL (ref 30.0–36.0)
MCV: 97.8 fL (ref 80.0–100.0)
Monocytes Absolute: 0.8 10*3/uL (ref 0.1–1.0)
Monocytes Relative: 15 %
Neutro Abs: 3.9 10*3/uL (ref 1.7–7.7)
Neutrophils Relative %: 66 %
Platelets: 238 10*3/uL (ref 150–400)
RBC: 4.02 MIL/uL — ABNORMAL LOW (ref 4.22–5.81)
RDW: 13.3 % (ref 11.5–15.5)
WBC: 5.7 10*3/uL (ref 4.0–10.5)
nRBC: 0 % (ref 0.0–0.2)

## 2020-12-28 LAB — BASIC METABOLIC PANEL
Anion gap: 8 (ref 5–15)
BUN: 14 mg/dL (ref 8–23)
CO2: 27 mmol/L (ref 22–32)
Calcium: 8.9 mg/dL (ref 8.9–10.3)
Chloride: 102 mmol/L (ref 98–111)
Creatinine, Ser: 1.13 mg/dL (ref 0.61–1.24)
GFR, Estimated: >60 mL/min (ref >60)
Glucose, Bld: 112 mg/dL — ABNORMAL HIGH (ref 70–99)
Potassium: 4.2 mmol/L (ref 3.5–5.1)
Sodium: 137 mmol/L (ref 135–145)

## 2020-12-28 LAB — IRON AND TIBC
Iron: 87 ug/dL (ref 45–182)
Saturation Ratios: 24 % (ref 17.9–39.5)
TIBC: 365 ug/dL (ref 250–450)
UIBC: 278 ug/dL

## 2020-12-28 LAB — FERRITIN: Ferritin: 31 ng/mL (ref 24–336)

## 2020-12-28 NOTE — Assessment & Plan Note (Addendum)
#  Iron deficiency anemia secondary to chronic blood loss  [see below]-12/11/2019- Hb 8.5; ferritin 15 [PCP]-continue p.o. iron.  S/p  IV Venofer; Today Hb-13.2. Hold Venofer.STABLE.   #Multiple recurrent high-grade noninvasive urothelial malignancy/hematuria s/p TURBT [facility].  However June 16 CT scan-interval increase in the size of the multiple bladder masses.  Awaiting repeat evaluation at [facility] this week.  Discussed that if patient needs adjuvant systemic therapy like Beryle Flock this could be offered locally.  However I would await recommendations from urology if adjuvant Beryle Flock is needed.  # Omental/peritoneal thickening-unclear etiology; follow-up CT June 2022 [[facility]]-negative for any progression.  Monitor for now.  #Constipation new onset in the last couple weeks-recommend MiraLAX twice a day; CT scan [facility] negative for any lesions in the colon.  If not improved recommend call us for possible GI evaluation.  # Lung nodule-/NISP- will repeat imaging in spring 2022; monitor for now.   # DISPOSITION: # NO venofer infusion today # follow up in 3 months  MD; labs; cbc/bmp;Possible Venofer-Dr.B

## 2020-12-28 NOTE — Progress Notes (Signed)
[place] NOTE  Patient Care Team: [name], MD as PCP - General (Infectious Diseases)  CHIEF COMPLAINTS/PURPOSE OF CONSULTATION: Peritoneal thickening/IDA  Oncology History Overview Note  # NOV 2020- [incidental/hematuria work-up]-subtle peritoneal thickening/plaque-like lesions; March 2021-CT scan progressive omental/peritoneal thickening  #Microscopic hematuria- sec to BPH [cystoscopy/CT urogram negative; Dr.Stoiff]; March 2021 CT scan-new lesion in the bladder diverticulum  # MAY-June 2021- severe IDA sec to hemauria [hb 8.5; Ferritin- ]  # on eliquis ? A.fib/pacemaker;   Urothelial carcinoma of bladder ([place])  10/29/2019 Initial Diagnosis   Urothelial carcinoma of bladder (HCC)      HISTORY OF PRESENTING ILLNESS:  Christopher [name] 87 y.o.  male with a history of peritoneal/omental thickening of unclear etiology; also superficial bladder malignancy/hematuria.  In the interim patient had a CT scan at [facility] about 5 days ago for his bladder cancer.  He is awaiting evaluation with urology next week.  He continues to complain of intermittent hematuria.  However, he is compliant with p.o. iron.  He complains of constipation the last couple of weeks.  He is not on any laxatives.   Review of Systems  Constitutional:  Negative for chills, diaphoresis, fever and weight loss.  HENT:  Negative for nosebleeds and sore throat.   Eyes:  Negative for double vision.  Respiratory:  Negative for cough, hemoptysis, sputum production and wheezing.   Cardiovascular:  Negative for chest pain, palpitations, orthopnea and leg swelling.  Gastrointestinal:  Positive for constipation. Negative for abdominal pain, blood in stool, diarrhea, heartburn, melena, nausea and vomiting.  Genitourinary:  Positive for hematuria. Negative for dysuria, frequency and urgency.  Musculoskeletal:  Negative for back pain and joint pain.  Skin: Negative.  Negative for itching and rash.   Neurological:  Negative for dizziness, tingling, focal weakness, weakness and headaches.  Endo/Heme/Allergies:  Does not bruise/bleed easily.  Psychiatric/Behavioral:  Negative for depression. The patient is not nervous/anxious and does not have insomnia.     MEDICAL HISTORY:  Past Medical History:  Diagnosis Date  . A-fib ([place])   . Anemia   . Arthritis    BACK  . Bladder cancer ([place])   . BPH (benign prostatic hyperplasia)   . Cancer ([place])    skin cancer on face carcinoma  . ED (erectile dysfunction)   . Gilbert disease   . HLD (hyperlipidemia)   . Hypertension   . Macular degeneration   . Mollusca contagiosa   . Osteopenia   . Presence of permanent cardiac pacemaker   . Sensorineural hearing loss   . Squamous cell carcinoma of skin 08/25/2019   R cheek   . Squamous cell carcinoma of skin 02/12/2020   L neck   . Squamous cell carcinoma of skin 11/30/2020   Right elbow, [facility]    SURGICAL HISTORY: Past Surgical History:  Procedure Laterality Date  . CATARACT EXTRACTION Bilateral   . CYSTOSCOPY WITH BIOPSY N/A 01/20/2020   Procedure: CYSTOSCOPY WITH BIOPSY;  Surgeon: Abbie Sons, MD;  Location: [facility];  Service: Urology;  Laterality: N/A;  . CYSTOSCOPY WITH FULGERATION N/A 01/20/2020   Procedure: CYSTOSCOPY WITH FULGERATION;  Surgeon: Abbie Sons, MD;  Location: [facility];  Service: Urology;  Laterality: N/A;  . CYSTOSCOPY WITH FULGERATION N/A 04/19/2020   Procedure: [place] with clot evacuation;  Surgeon: Abbie Sons, MD;  Location: [facility];  Service: Urology;  Laterality: N/A;  . HEMORRHOIDECTOMY WITH HEMORRHOID BANDING    . KNEE ARTHROSCOPY Left   .  PACEMAKER INSERTION N/A 03/03/2015   Procedure: INSERTION DUAL LEAD PACEMAKER;  Surgeon: Isaias Cowman, MD;  Location: [facility];  Service: Cardiovascular;  Laterality: N/A;  . PULSE GENERATOR IMPLANT N/A 10/18/2020   Procedure: UNILATERAL PULSE GENERATOR IMPLANT;  Surgeon: [name],  MD;  Location: [facility];  Service: Neurosurgery;  Laterality: N/A;  . SPINAL CORD STIMULATOR TRIAL N/A 10/11/2020   Procedure: PERCUTANEOUS SPINAL CORD STIMULATOR TRIAL;  Surgeon: [name], MD;  Location: [facility];  Service: Neurosurgery;  Laterality: N/A;  . THORACIC LAMINECTOMY FOR SPINAL CORD STIMULATOR N/A 10/18/2020   Procedure: PERCUTANEOUS LEAD PLACEMENT;  Surgeon: [name], MD;  Location: [facility];  Service: Neurosurgery;  Laterality: N/A;  . TONSILLECTOMY    . TRANSURETHRAL RESECTION OF BLADDER TUMOR N/A 10/07/2019   Procedure: TRANSURETHRAL RESECTION OF BLADDER TUMOR (TURBT);  Surgeon: Abbie Sons, MD;  Location: [facility];  Service: Urology;  Laterality: N/A;  . TRANSURETHRAL RESECTION OF BLADDER TUMOR N/A 04/13/2020   Procedure: TRANSURETHRAL RESECTION OF BLADDER TUMOR (TURBT);  Surgeon: Abbie Sons, MD;  Location: [facility];  Service: Urology;  Laterality: N/A;    SOCIAL HISTORY: Social History   Socioeconomic History  . Marital status: Widowed    Spouse name: Not on file  . Number of children: Not on file  . Years of education: Not on file  . Highest education level: Not on file  Occupational History  . Not on file  Tobacco Use  . Smoking status: Never  . Smokeless tobacco: Never  Vaping Use  . Vaping Use: Never used  Substance and Sexual Activity  . Alcohol use: Yes    Alcohol/week: 7.0 standard drinks    Types: 7 Glasses of wine per week    Comment: DAILY   . Drug use: No  . Sexual activity: Yes    Birth control/protection: None  Other Topics Concern  . Not on file  Social History Narrative   Retd. Civil engineer, contracting; [place]; self. Never smoked; alcohol- glass wine/drink every night.     Social Determinants of Health   Financial Resource Strain: Not on file  Food Insecurity: Not on file  Transportation Needs: Not on file  Physical Activity: Not on file  Stress: Not on file  Social Connections: Not on file  Intimate Partner Violence: Not on  file    FAMILY HISTORY: Family History  Problem Relation Age of Onset  . Diabetes Mother   . Lung cancer Father   . Cancer Paternal Uncle   . Prostate cancer Neg Hx   . Bladder Cancer Neg Hx   . Kidney cancer Neg Hx     ALLERGIES:  is allergic to atenolol.  MEDICATIONS:  Current Outpatient Medications  Medication Sig Dispense Refill  . alfuzosin (UROXATRAL) 10 MG 24 hr tablet Take 1 tablet (10 mg total) by mouth daily with breakfast. 90 tablet 3  . Apixaban (ELIQUIS PO) Take by mouth.    Marland Kitchen atorvastatin (LIPITOR) 10 MG tablet Take 10 mg by mouth in the morning.    . calcium carbonate (OSCAL) 1500 (600 Ca) MG TABS tablet Take 600 mg of elemental calcium by mouth in the morning.    . Cholecalciferol (VITAMIN D3) 50 MCG (2000 UT) TABS Take 2,000 Units by mouth in the morning.    . ferrous sulfate 325 (65 FE) MG tablet Take 325 mg by mouth in the morning and at bedtime.    . finasteride (PROSCAR) 5 MG tablet TAKE 1 TABLET DAILY 90 tablet 3  .  metoprolol succinate (TOPROL-XL) 25 MG 24 hr tablet Take 25 mg by mouth in the morning. 25 mg + 50 mg=75 mg    . metoprolol succinate (TOPROL-XL) 50 MG 24 hr tablet Take 50 mg by mouth in the morning. 50 mg + 25 mg=75 mg    . Multiple Vitamin (MULTIVITAMIN WITH MINERALS) TABS tablet Take 1 tablet by mouth in the morning.    . Multiple Vitamins-Minerals (PRESERVISION AREDS 2 PO) Take 1 tablet by mouth in the morning and at bedtime.    Vladimir Faster Glycol-Propyl Glycol (LUBRICANT EYE DROPS) 0.4-0.3 % SOLN Place 1 drop into both eyes in the morning and at bedtime.    . tamsulosin (FLOMAX) 0.4 MG CAPS capsule Take 0.4 mg by mouth in the morning and at bedtime.    . traMADol (ULTRAM) 50 MG tablet Take 1 tablet (50 mg total) by mouth every 8 (eight) hours as needed for severe pain. (Patient not taking: Reported on 12/28/2020) 20 tablet 0   No current facility-administered medications for this visit.      Marland Kitchen  PHYSICAL EXAMINATION: ECOG PERFORMANCE  STATUS: 0 - Asymptomatic  Vitals:   12/28/20 1353  BP: (!) 142/69  Pulse: 81  Resp: 18  Temp: 98 F (36.7 C)  SpO2: 95%   Filed Weights   12/28/20 1353  Weight: 195 lb (88.5 kg)    Physical Exam HENT:     Head: Normocephalic and atraumatic.     Mouth/Throat:     Pharynx: No oropharyngeal exudate.  Eyes:     Pupils: Pupils are equal, round, and reactive to light.  Cardiovascular:     Rate and Rhythm: Normal rate and regular rhythm.  Pulmonary:     Effort: No respiratory distress.     Breath sounds: No wheezing.  Abdominal:     General: Bowel sounds are normal. There is no distension.     Palpations: Abdomen is soft. There is no mass.     Tenderness: There is no abdominal tenderness. There is no guarding or rebound.  Musculoskeletal:        General: No tenderness. Normal range of motion.     Cervical back: Normal range of motion and neck supple.  Skin:    General: Skin is warm.  Neurological:     Mental Status: He is [name] and oriented to person, place, and time.  Psychiatric:        Mood and Affect: Affect normal.      LABORATORY DATA:  I have reviewed the data as listed Lab Results  Component Value Date   WBC 5.7 12/28/2020   HGB 13.1 12/28/2020   HCT 39.3 12/28/2020   MCV 97.8 12/28/2020   PLT 238 12/28/2020   Recent Labs    01/13/20 1412 01/13/20 1412 01/16/20 1305 03/16/20 1028 03/19/20 1256 04/19/20 1231 06/29/20 1300 10/01/20 1105 12/28/20 1342  NA 141   < > 141  --  140   < > 139 140 137  K 4.6  --  3.7  --  4.1   < > 4.4 4.0 4.2  CL 102  --  103  --  104   < > 102 104 102  CO2 26  --  30  --  28   < > 30 29 27   GLUCOSE 92   < > 110*  --  118*   < > 109* 108* 112*  BUN 18   < > 24*  --  18   < > 17  20 14  CREATININE 0.97  --  1.05   < > 1.06   < > 0.99 1.04 1.13  CALCIUM 9.6  --  9.1  --  8.9   < > 9.3 9.0 8.9  GFRNONAA 69  --  >60  --  >60   < > >60 >60 >60  GFRAA 80  --  >60  --  >60  --   --   --   --   PROT  --   --   --   --  6.7   --   --   --   --   ALBUMIN  --   --   --   --  3.6  --   --   --   --   AST  --   --   --   --  25  --   --   --   --   ALT  --   --   --   --  25  --   --   --   --   ALKPHOS  --   --   --   --  63  --   --   --   --   BILITOT  --   --   --   --  1.3*  --   --   --   --    < > = values in this interval not displayed.    RADIOGRAPHIC STUDIES: I have personally reviewed the radiological images as listed and agreed with the findings in the report. No results found.  ASSESSMENT & PLAN:   Iron deficiency anemia due to chronic blood loss #Iron deficiency anemia secondary to chronic blood loss  [see below]-12/11/2019- Hb 8.5; ferritin 15 [PCP]-continue p.o. iron.  S/p  IV Venofer; Today Hb-13.2. Hold Venofer.STABLE.   #Multiple recurrent high-grade noninvasive urothelial malignancy/hematuria s/p TURBT [facility].  However June 16 CT scan-interval increase in the size of the multiple bladder masses.  Awaiting repeat evaluation at [facility] this week.  Discussed that if patient needs adjuvant systemic therapy like Beryle Flock this could be offered locally.  However I would await recommendations from urology if adjuvant Beryle Flock is needed.  # Omental/peritoneal thickening-unclear etiology; follow-up CT June 2022 [[facility]]-negative for any progression.  Monitor for now.  #Constipation new onset in the last couple weeks-recommend MiraLAX twice a day; CT scan [facility] negative for any lesions in the colon.  If not improved recommend call us for possible GI evaluation.  # Lung nodule-/NISP- will repeat imaging in spring 2022; monitor for now.   # DISPOSITION: # NO venofer infusion today # follow up in 3 months  MD; labs; cbc/bmp;Possible Venofer-Dr.B   All questions were answered. The patient knows to call the clinic with any problems, questions or concerns.    Cammie Sickle, MD 12/28/2020 3:07 PM

## 2020-12-28 NOTE — Progress Notes (Signed)
Patient would like to discuss his bladder cancer

## 2021-01-03 ENCOUNTER — Other Ambulatory Visit: Payer: Self-pay

## 2021-01-03 ENCOUNTER — Inpatient Hospital Stay
Admission: EM | Admit: 2021-01-03 | Discharge: 2021-01-20 | DRG: 662 | Disposition: A | Discharge status: Home Health | Payer: Medicare HMO | Attending: Internal Medicine | Admitting: Internal Medicine

## 2021-01-03 ENCOUNTER — Encounter: Payer: Self-pay | Admitting: Emergency Medicine

## 2021-01-03 DIAGNOSIS — I110 Hypertensive heart disease with heart failure: Secondary | ICD-10-CM | POA: Diagnosis present

## 2021-01-03 DIAGNOSIS — C679 Malignant neoplasm of bladder, unspecified: Secondary | ICD-10-CM | POA: Diagnosis present

## 2021-01-03 DIAGNOSIS — N138 Other obstructive and reflux uropathy: Secondary | ICD-10-CM | POA: Diagnosis present

## 2021-01-03 DIAGNOSIS — N401 Enlarged prostate with lower urinary tract symptoms: Secondary | ICD-10-CM | POA: Diagnosis present

## 2021-01-03 DIAGNOSIS — H3530 Unspecified macular degeneration: Secondary | ICD-10-CM | POA: Diagnosis present

## 2021-01-03 DIAGNOSIS — R350 Frequency of micturition: Secondary | ICD-10-CM | POA: Diagnosis present

## 2021-01-03 DIAGNOSIS — E875 Hyperkalemia: Secondary | ICD-10-CM | POA: Diagnosis present

## 2021-01-03 DIAGNOSIS — N178 Other acute kidney failure: Secondary | ICD-10-CM | POA: Diagnosis not present

## 2021-01-03 DIAGNOSIS — I5033 Acute on chronic diastolic (congestive) heart failure: Secondary | ICD-10-CM | POA: Diagnosis present

## 2021-01-03 DIAGNOSIS — Z20822 Contact with and (suspected) exposure to covid-19: Secondary | ICD-10-CM | POA: Diagnosis present

## 2021-01-03 DIAGNOSIS — D62 Acute posthemorrhagic anemia: Secondary | ICD-10-CM | POA: Diagnosis present

## 2021-01-03 DIAGNOSIS — Z888 Allergy status to other drugs, medicaments and biological substances status: Secondary | ICD-10-CM

## 2021-01-03 DIAGNOSIS — I4819 Other persistent atrial fibrillation: Secondary | ICD-10-CM | POA: Diagnosis present

## 2021-01-03 DIAGNOSIS — D090 Carcinoma in situ of bladder: Secondary | ICD-10-CM | POA: Diagnosis not present

## 2021-01-03 DIAGNOSIS — R55 Syncope and collapse: Secondary | ICD-10-CM

## 2021-01-03 DIAGNOSIS — R310 Gross hematuria: Secondary | ICD-10-CM | POA: Diagnosis present

## 2021-01-03 DIAGNOSIS — R531 Weakness: Secondary | ICD-10-CM | POA: Diagnosis not present

## 2021-01-03 DIAGNOSIS — I10 Essential (primary) hypertension: Secondary | ICD-10-CM | POA: Diagnosis present

## 2021-01-03 DIAGNOSIS — N3289 Other specified disorders of bladder: Secondary | ICD-10-CM | POA: Diagnosis present

## 2021-01-03 DIAGNOSIS — R410 Disorientation, unspecified: Secondary | ICD-10-CM

## 2021-01-03 DIAGNOSIS — E8809 Other disorders of plasma-protein metabolism, not elsewhere classified: Secondary | ICD-10-CM | POA: Diagnosis present

## 2021-01-03 DIAGNOSIS — N179 Acute kidney failure, unspecified: Secondary | ICD-10-CM | POA: Diagnosis present

## 2021-01-03 DIAGNOSIS — J9601 Acute respiratory failure with hypoxia: Secondary | ICD-10-CM | POA: Diagnosis not present

## 2021-01-03 DIAGNOSIS — D509 Iron deficiency anemia, unspecified: Secondary | ICD-10-CM | POA: Diagnosis present

## 2021-01-03 DIAGNOSIS — R571 Hypovolemic shock: Secondary | ICD-10-CM | POA: Diagnosis present

## 2021-01-03 DIAGNOSIS — E871 Hypo-osmolality and hyponatremia: Secondary | ICD-10-CM | POA: Diagnosis present

## 2021-01-03 DIAGNOSIS — I493 Ventricular premature depolarization: Secondary | ICD-10-CM | POA: Diagnosis present

## 2021-01-03 DIAGNOSIS — Z85828 Personal history of other malignant neoplasm of skin: Secondary | ICD-10-CM | POA: Diagnosis not present

## 2021-01-03 DIAGNOSIS — C674 Malignant neoplasm of posterior wall of bladder: Secondary | ICD-10-CM | POA: Diagnosis not present

## 2021-01-03 DIAGNOSIS — Z7901 Long term (current) use of anticoagulants: Secondary | ICD-10-CM

## 2021-01-03 DIAGNOSIS — N3001 Acute cystitis with hematuria: Secondary | ICD-10-CM | POA: Diagnosis not present

## 2021-01-03 DIAGNOSIS — I480 Paroxysmal atrial fibrillation: Secondary | ICD-10-CM | POA: Diagnosis not present

## 2021-01-03 DIAGNOSIS — E785 Hyperlipidemia, unspecified: Secondary | ICD-10-CM | POA: Diagnosis present

## 2021-01-03 DIAGNOSIS — F05 Delirium due to known physiological condition: Secondary | ICD-10-CM | POA: Diagnosis not present

## 2021-01-03 DIAGNOSIS — Z79899 Other long term (current) drug therapy: Secondary | ICD-10-CM

## 2021-01-03 DIAGNOSIS — R0600 Dyspnea, unspecified: Secondary | ICD-10-CM

## 2021-01-03 LAB — CBC
HCT: 26.7 % — ABNORMAL LOW (ref 39.0–52.0)
HCT: 23.6 % — ABNORMAL LOW (ref 39.0–52.0)
Hemoglobin: 8.9 g/dL — ABNORMAL LOW (ref 13.0–17.0)
Hemoglobin: 7.9 g/dL — ABNORMAL LOW (ref 13.0–17.0)
MCH: 32.4 pg (ref 26.0–34.0)
MCH: 32.5 pg (ref 26.0–34.0)
MCHC: 33.3 g/dL (ref 30.0–36.0)
MCHC: 33.5 g/dL (ref 30.0–36.0)
MCV: 97.1 fL (ref 80.0–100.0)
MCV: 97.1 fL (ref 80.0–100.0)
Platelets: 227 10*3/uL (ref 150–400)
Platelets: 196 10*3/uL (ref 150–400)
RBC: 2.75 MIL/uL — ABNORMAL LOW (ref 4.22–5.81)
RBC: 2.43 MIL/uL — ABNORMAL LOW (ref 4.22–5.81)
RDW: 13.2 % (ref 11.5–15.5)
RDW: 13.2 % (ref 11.5–15.5)
WBC: 7.9 10*3/uL (ref 4.0–10.5)
WBC: 7.3 10*3/uL (ref 4.0–10.5)
nRBC: 0 % (ref 0.0–0.2)
nRBC: 0 % (ref 0.0–0.2)

## 2021-01-03 LAB — URINALYSIS, COMPLETE (UACMP) WITH MICROSCOPIC
Bacteria, UA: NONE SEEN
RBC / HPF: >50 RBC/hpf — ABNORMAL HIGH (ref 0–5)
Specific Gravity, Urine: 1.025 (ref 1.005–1.030)
Squamous Epithelial / HPF: NONE SEEN (ref 0–5)
WBC, UA: >50 WBC/hpf — ABNORMAL HIGH (ref 0–5)

## 2021-01-03 LAB — BASIC METABOLIC PANEL
Anion gap: 7 (ref 5–15)
BUN: 20 mg/dL (ref 8–23)
CO2: 26 mmol/L (ref 22–32)
Calcium: 8.6 mg/dL — ABNORMAL LOW (ref 8.9–10.3)
Chloride: 99 mmol/L (ref 98–111)
Creatinine, Ser: 1.07 mg/dL (ref 0.61–1.24)
GFR, Estimated: >60 mL/min (ref >60)
Glucose, Bld: 133 mg/dL — ABNORMAL HIGH (ref 70–99)
Potassium: 4.1 mmol/L (ref 3.5–5.1)
Sodium: 132 mmol/L — ABNORMAL LOW (ref 135–145)

## 2021-01-03 LAB — RESP PANEL BY RT-PCR (FLU A&B, COVID) ARPGX2
Influenza A by PCR: NEGATIVE
Influenza B by PCR: NEGATIVE
SARS Coronavirus 2 by RT PCR: NEGATIVE

## 2021-01-03 LAB — HEMOGLOBIN AND HEMATOCRIT, BLOOD
HCT: 25.9 % — ABNORMAL LOW (ref 39.0–52.0)
Hemoglobin: 8.7 g/dL — ABNORMAL LOW (ref 13.0–17.0)

## 2021-01-03 MED ORDER — BELLADONNA ALKALOIDS-OPIUM 16.2-60 MG RE SUPP
1.0000 | Freq: Four times a day (QID) | RECTAL | Status: DC | PRN
  Administered 2021-01-03 – 2021-01-06 (×5): 1 via RECTAL
  Filled 2021-01-03 (×8): qty 1

## 2021-01-03 MED ORDER — TAMSULOSIN HCL 0.4 MG PO CAPS
0.4000 mg | ORAL_CAPSULE | Freq: Every day | ORAL | Status: DC
  Administered 2021-01-04 – 2021-01-18 (×14): 0.4 mg via ORAL
  Filled 2021-01-03 (×15): qty 1

## 2021-01-03 MED ORDER — ATORVASTATIN CALCIUM 10 MG PO TABS
10.0000 mg | ORAL_TABLET | Freq: Every day | ORAL | Status: DC
  Administered 2021-01-03 – 2021-01-19 (×16): 10 mg via ORAL
  Filled 2021-01-03 (×16): qty 1

## 2021-01-03 MED ORDER — MORPHINE SULFATE (PF) 2 MG/ML IV SOLN
1.0000 mg | INTRAVENOUS | Status: DC | PRN
  Administered 2021-01-03: 2 mg via INTRAVENOUS
  Administered 2021-01-04: 1 mg via INTRAVENOUS
  Filled 2021-01-03 (×2): qty 1

## 2021-01-03 MED ORDER — ACETAMINOPHEN 650 MG RE SUPP: 650.0000 mg | Freq: Four times a day (QID) | RECTAL | Status: DC | PRN

## 2021-01-03 MED ORDER — ONDANSETRON HCL 4 MG/2ML IJ SOLN
4.0000 mg | Freq: Once | INTRAMUSCULAR | Status: AC
  Administered 2021-01-03: 4 mg via INTRAVENOUS
  Filled 2021-01-03: qty 2

## 2021-01-03 MED ORDER — METOPROLOL SUCCINATE ER 50 MG PO TB24: 25.0000 mg | ORAL_TABLET | Freq: Every morning | ORAL | Status: DC

## 2021-01-03 MED ORDER — VITAMIN D 25 MCG (1000 UNIT) PO TABS
2000.0000 [IU] | ORAL_TABLET | Freq: Every morning | ORAL | Status: DC
  Administered 2021-01-04 – 2021-01-20 (×16): 2000 [IU] via ORAL
  Filled 2021-01-03 (×15): qty 2

## 2021-01-03 MED ORDER — FINASTERIDE 5 MG PO TABS
5.0000 mg | ORAL_TABLET | Freq: Every day | ORAL | Status: DC
  Administered 2021-01-03 – 2021-01-20 (×17): 5 mg via ORAL
  Filled 2021-01-03 (×17): qty 1

## 2021-01-03 MED ORDER — ADULT MULTIVITAMIN W/MINERALS CH
1.0000 | ORAL_TABLET | Freq: Every morning | ORAL | Status: DC
  Administered 2021-01-04 – 2021-01-20 (×15): 1 via ORAL
  Filled 2021-01-03 (×14): qty 1

## 2021-01-03 MED ORDER — PRESERVISION AREDS 2 PO CAPS: 1.0000 | ORAL_CAPSULE | Freq: Every day | ORAL | Status: DC

## 2021-01-03 MED ORDER — ACETAMINOPHEN 325 MG PO TABS: 650.0000 mg | ORAL_TABLET | Freq: Four times a day (QID) | ORAL | Status: DC | PRN

## 2021-01-03 MED ORDER — ALFUZOSIN HCL ER 10 MG PO TB24
10.0000 mg | ORAL_TABLET | Freq: Every day | ORAL | Status: DC
  Administered 2021-01-04 – 2021-01-20 (×16): 10 mg via ORAL
  Filled 2021-01-03 (×17): qty 1

## 2021-01-03 MED ORDER — CALCIUM CARBONATE 1250 (500 CA) MG PO TABS
500.0000 mg | ORAL_TABLET | Freq: Every day | ORAL | Status: DC
  Administered 2021-01-04 – 2021-01-20 (×16): 500 mg via ORAL
  Filled 2021-01-03 (×17): qty 1

## 2021-01-03 MED ORDER — SODIUM CHLORIDE 0.9 % IV BOLUS
1000.0000 mL | Freq: Once | INTRAVENOUS | Status: AC
Start: 2021-01-03 — End: 2021-01-03
  Administered 2021-01-03: 1000 mL via INTRAVENOUS

## 2021-01-03 MED ORDER — FERROUS SULFATE 325 (65 FE) MG PO TABS
325.0000 mg | ORAL_TABLET | Freq: Every day | ORAL | Status: DC
  Administered 2021-01-04 – 2021-01-20 (×16): 325 mg via ORAL
  Filled 2021-01-03 (×16): qty 1

## 2021-01-03 MED ORDER — FENTANYL CITRATE (PF) 100 MCG/2ML IJ SOLN
50.0000 ug | Freq: Once | INTRAMUSCULAR | Status: AC
  Administered 2021-01-03: 50 ug via INTRAVENOUS
  Filled 2021-01-03: qty 2

## 2021-01-03 MED ORDER — ONDANSETRON HCL 4 MG/2ML IJ SOLN: 4.0000 mg | Freq: Four times a day (QID) | INTRAMUSCULAR | Status: DC | PRN

## 2021-01-03 MED ORDER — SODIUM CHLORIDE 0.9 % IR SOLN
3000.0000 mL | Status: DC
  Administered 2021-01-03 – 2021-01-05 (×19): 3000 mL

## 2021-01-03 MED ORDER — ONDANSETRON HCL 4 MG PO TABS: 4.0000 mg | ORAL_TABLET | Freq: Four times a day (QID) | ORAL | Status: DC | PRN

## 2021-01-03 MED ORDER — FISH OIL 1000 MG PO CAPS: 1000.0000 mg | ORAL_CAPSULE | Freq: Every day | ORAL | Status: DC

## 2021-01-03 NOTE — ED Notes (Signed)
Messaged RN bed assigned 

## 2021-01-03 NOTE — ED Provider Notes (Signed)
[facility] Emergency Department Provider Note   ____________________________________________   Event Date/Time   First MD Initiated Contact with Patient 01/03/21 1258     (approximate)  I have reviewed the triage vital signs and the nursing notes.   HISTORY  Chief Complaint Loss of Consciousness    HPI Christopher [name] is a 87 y.o. male presents for hematuria  LOCATION: Genital/urethra DURATION: 12 hours TIMING: Stable since onset SEVERITY: Severe QUALITY: Bleeding CONTEXT: Patient had a urological procedure performed at [facility] 1 week ago MODIFYING FACTORS: Denies any exacerbating or relieving factors ASSOCIATED SYMPTOMS: Abdominal pain, syncope   Per medical record review patient has a history of urothelial cancer and had a cystoscopy, TURP, vs bladder biopsy on 12/20/2020 with Dr. Montine Circle in urology at [facility]          Past Medical History:  Diagnosis Date   A-fib [facility])    Anemia    Arthritis    BACK   Bladder cancer ([place])    BPH (benign prostatic hyperplasia)    Cancer ([place])    skin cancer on face carcinoma   ED (erectile dysfunction)    Rosanna Randy disease    HLD (hyperlipidemia)    Hypertension    Macular degeneration    Mollusca contagiosa    Osteopenia    Presence of permanent cardiac pacemaker    Sensorineural hearing loss    Squamous cell carcinoma of skin 08/25/2019   R cheek    Squamous cell carcinoma of skin 02/12/2020   L neck    Squamous cell carcinoma of skin 11/30/2020   Right elbow, [facility]    Patient Active Problem List   Diagnosis Date Noted   Iron deficiency anemia due to chronic blood loss 12/18/2019   Urothelial carcinoma of bladder ([place]) 10/29/2019   Chronic anticoagulation 07/15/2019   Lesion of peritoneum 06/11/2019   Imbalance 01/29/2019   Lumbar degenerative disc disease 12/04/2018   Primary osteoarthritis of left knee 04/11/2018   Persistent atrial fibrillation ([place]) 10/11/2016   Chronic  right-sided low back pain without sciatica 06/14/2015   Dizziness 03/10/2015   Symptomatic sinus bradycardia 03/10/2015   Syncope and collapse 02/28/2015   Symptomatic bradycardia 02/28/2015   HTN (hypertension) 02/28/2015   Hypercholesteremia 02/28/2015   Benign prostatic hyperplasia with urinary frequency 02/28/2015   Alcohol use 02/28/2015   Other specified health status 02/28/2015   Asymptomatic proteinuria 01/04/2015   Chronic leukopenia 11/14/2013   Glucose intolerance (impaired glucose tolerance) 11/14/2013   High risk medication use 11/14/2013   Dupuytren contracture 09/29/2013   ED (erectile dysfunction) 09/29/2013   Rosanna Randy syndrome 09/29/2013   Macular degeneration 09/29/2013   Osteopenia 09/29/2013   SNHL (sensorineural hearing loss) 09/29/2013    Past Surgical History:  Procedure Laterality Date   CATARACT EXTRACTION Bilateral    CYSTOSCOPY WITH BIOPSY N/A 01/20/2020   Procedure: CYSTOSCOPY WITH BIOPSY;  Surgeon: Abbie Sons, MD;  Location: [facility];  Service: Urology;  Laterality: N/A;   CYSTOSCOPY WITH FULGERATION N/A 01/20/2020   Procedure: CYSTOSCOPY WITH FULGERATION;  Surgeon: Abbie Sons, MD;  Location: [facility];  Service: Urology;  Laterality: N/A;   CYSTOSCOPY WITH FULGERATION N/A 04/19/2020   Procedure: [place] with clot evacuation;  Surgeon: Abbie Sons, MD;  Location: [facility];  Service: Urology;  Laterality: N/A;   HEMORRHOIDECTOMY WITH HEMORRHOID BANDING     KNEE ARTHROSCOPY Left    PACEMAKER INSERTION N/A 03/03/2015   Procedure: INSERTION DUAL LEAD PACEMAKER;  Surgeon: Isaias Cowman, MD;  Location: [facility];  Service: Cardiovascular;  Laterality: N/A;   PULSE GENERATOR IMPLANT N/A 10/18/2020   Procedure: UNILATERAL PULSE GENERATOR IMPLANT;  Surgeon: [name], MD;  Location: [facility];  Service: Neurosurgery;  Laterality: N/A;   SPINAL CORD STIMULATOR TRIAL N/A 10/11/2020   Procedure: PERCUTANEOUS SPINAL CORD  STIMULATOR TRIAL;  Surgeon: [name], MD;  Location: [facility];  Service: Neurosurgery;  Laterality: N/A;   THORACIC LAMINECTOMY FOR SPINAL CORD STIMULATOR N/A 10/18/2020   Procedure: PERCUTANEOUS LEAD PLACEMENT;  Surgeon: [name], MD;  Location: [facility];  Service: Neurosurgery;  Laterality: N/A;   TONSILLECTOMY     TRANSURETHRAL RESECTION OF BLADDER TUMOR N/A 10/07/2019   Procedure: TRANSURETHRAL RESECTION OF BLADDER TUMOR (TURBT);  Surgeon: Abbie Sons, MD;  Location: [facility];  Service: Urology;  Laterality: N/A;   TRANSURETHRAL RESECTION OF BLADDER TUMOR N/A 04/13/2020   Procedure: TRANSURETHRAL RESECTION OF BLADDER TUMOR (TURBT);  Surgeon: Abbie Sons, MD;  Location: [facility];  Service: Urology;  Laterality: N/A;    Prior to Admission medications   Medication Sig Start Date End Date Taking? Authorizing Provider  alfuzosin (UROXATRAL) 10 MG 24 hr tablet Take 1 tablet (10 mg total) by mouth daily with breakfast. 08/21/19   Stoioff, Ronda Fairly, MD  Apixaban (ELIQUIS PO) Take by mouth.    [provider]  atorvastatin (LIPITOR) 10 MG tablet Take 10 mg by mouth in the morning. 11/18/14   [provider]  calcium carbonate (OSCAL) 1500 (600 Ca) MG TABS tablet Take 600 mg of elemental calcium by mouth in the morning.    [provider]  Cholecalciferol (VITAMIN D3) 50 MCG (2000 UT) TABS Take 2,000 Units by mouth in the morning.    [provider]  ferrous sulfate 325 (65 FE) MG tablet Take 325 mg by mouth in the morning and at bedtime.    [provider]  finasteride (PROSCAR) 5 MG tablet TAKE 1 TABLET DAILY 12/02/20   Stoioff, Ronda Fairly, MD  metoprolol succinate (TOPROL-XL) 25 MG 24 hr tablet Take 25 mg by mouth in the morning. 25 mg + 50 mg=75 mg    [provider]  metoprolol succinate (TOPROL-XL) 50 MG 24 hr tablet Take 50 mg by mouth in the morning. 50 mg + 25 mg=75 mg 03/22/19   [provider]  Multiple Vitamin  (MULTIVITAMIN WITH MINERALS) TABS tablet Take 1 tablet by mouth in the morning.    [provider]  Multiple Vitamins-Minerals (PRESERVISION AREDS 2 PO) Take 1 tablet by mouth in the morning and at bedtime.    [provider]  Polyethyl Glycol-Propyl Glycol (LUBRICANT EYE DROPS) 0.4-0.3 % SOLN Place 1 drop into both eyes in the morning and at bedtime.    [provider]  tamsulosin (FLOMAX) 0.4 MG CAPS capsule Take 0.4 mg by mouth in the morning and at bedtime. 05/17/20   [provider]  traMADol (ULTRAM) 50 MG tablet Take 1 tablet (50 mg total) by mouth every 8 (eight) hours as needed for severe pain. Patient not taking: Reported on 12/28/2020 10/18/20 10/18/21  [name], MD    Allergies Atenolol  Family History  Problem Relation Age of Onset   Diabetes Mother    Lung cancer Father    Cancer Paternal Uncle    Prostate cancer Neg Hx    Bladder Cancer Neg Hx    Kidney cancer Neg Hx     Social History Social History  Tobacco Use   Smoking status: Never   Smokeless tobacco: Never  Vaping Use   Vaping Use: Never used  Substance Use Topics   Alcohol use: Yes    Alcohol/week: 7.0 standard drinks    Types: 7 Glasses of wine per week    Comment: DAILY    Drug use: No    Review of Systems Constitutional: No fever/chills Eyes: No visual changes. ENT: No sore throat. Cardiovascular: Denies chest pain. Respiratory: Denies shortness of breath. Gastrointestinal: Endorses abdominal pain.  No nausea, no vomiting.  No diarrhea. Genitourinary: Negative for dysuria.  Positive for hematuria Musculoskeletal: Negative for acute arthralgias Skin: Negative for rash. Neurological: Negative for headaches, weakness/numbness/paresthesias in any extremity Psychiatric: Negative for suicidal ideation/homicidal ideation   ____________________________________________   PHYSICAL EXAM:  VITAL SIGNS: ED Triage Vitals  Enc Vitals Group     BP 01/03/21 1256  122/63     Pulse Rate 01/03/21 1256 70     Resp 01/03/21 1256 18     Temp 01/03/21 1256 97.9 F (36.6 C)     Temp Source 01/03/21 1256 Oral     SpO2 01/03/21 1256 100 %     Weight 01/03/21 1253 195 lb (88.5 kg)     Height 01/03/21 1253 5\' 11"  (1.803 m)     Head Circumference --      Peak Flow --      Pain Score 01/03/21 1253 0     Pain Loc --      Pain Edu? --      Excl. in [place]? --    Constitutional: Alert and oriented. Well appearing and in no acute distress. Eyes: Conjunctivae are normal. PERRL. Head: Atraumatic. Nose: No congestion/rhinnorhea. Mouth/Throat: Mucous membranes are moist. Neck: No stridor Cardiovascular: Grossly normal heart sounds.  Good peripheral circulation. Respiratory: Normal respiratory effort.  No retractions. Gastrointestinal: Soft and nontender. No distention. Genitourinary: Normal external male genitalia without lesions or rashes with dried blood at the urethral meatus Musculoskeletal: No obvious deformities Neurologic:  Normal speech and language. No gross focal neurologic deficits are appreciated. Skin:  Skin is warm and dry. No rash noted. Psychiatric: Mood and affect are normal. Speech and behavior are normal.  ____________________________________________   LABS (all labs ordered are listed, but only abnormal results are displayed)  Labs Reviewed  BASIC METABOLIC PANEL - Abnormal; Notable for the following components:      Result Value   Sodium 132 (*)    Glucose, Bld 133 (*)    Calcium 8.6 (*)    All other components within normal limits  CBC - Abnormal; Notable for the following components:   RBC 2.75 (*)    Hemoglobin 8.9 (*)    HCT 26.7 (*)    All other components within normal limits  RESP PANEL BY RT-PCR (FLU A&B, COVID) ARPGX2  URINALYSIS, COMPLETE (UACMP) WITH MICROSCOPIC  CBG MONITORING, ED  TYPE AND SCREEN     PROCEDURES  Procedure(s) performed (including Critical Care):  .1-3 Lead EKG Interpretation  Date/Time:  01/03/2021 3:20 PM Performed by: Naaman Plummer, MD Authorized by: Naaman Plummer, MD     Interpretation: normal     ECG rate:  71   ECG rate assessment: normal     Rhythm: sinus rhythm     Ectopy: none     Conduction: normal     ____________________________________________   INITIAL IMPRESSION / ASSESSMENT AND PLAN / ED COURSE  As part of my medical decision making, I reviewed the  following data within the electronic medical record, if available:  Nursing notes reviewed and incorporated, Labs reviewed, EKG interpreted, Old chart reviewed, Radiograph reviewed and Notes from prior ED visits reviewed and incorporated     31-year-old male with the above-stated past medical history who presents for hematuria that began last night and has been continuing with a significant drop in patient's hemoglobin  Workup: UA Findings: UA without evidence of infection. VSS and patient in NAD, afebrile.  Patient's history, exam, and studies ordered are not consistent with kidney stone, urethral or intra-abdominal trauma, drug reaction, coagulopathy, prostatitis or other serious bacterial infection. Disposition: Admit. A Foley catheter was placed and irrigation was initiated. However, some degree of bleeding seems to be ongoing, and there is incomplete clearing of the urine, so the patient will be admitted for further evaluation and treatment by the urologist.      ____________________________________________   FINAL CLINICAL IMPRESSION(S) / ED DIAGNOSES  Final diagnoses:  None     ED Discharge Orders     None        Note:  This document was prepared using Dragon voice recognition software and may include unintentional dictation errors.    Naaman Plummer, MD 01/03/21 1520

## 2021-01-03 NOTE — ED Notes (Signed)
Request made for transport to the floor ?

## 2021-01-03 NOTE — Consult Note (Signed)
Urology Consult  Requesting physician: Dr. Francine Graven  Reason for consultation: Gross hematuria  Chief Complaint: Blood in urine  History of Present Illness: Christopher [name] is a 87 y.o. male with a history of recurrent T1 high-grade urothelial carcinoma the bladder and CIS previously followed by me and currently being managed at [facility].  TURBT at [facility] 12/20/2020 with findings of multifocal increased tumor burden who underwent resection and debulking of larger tumors on the posterior bladder wall.  >5 tumors were identified in the bladder largest ~ 5 cm Pathology T1 urothelial carcinoma (high-grade) His Foley catheter was removed 2-3 days postop On chronic anticoagulation with Eliquis and states he restarted ~ 3 days postop Last night he developed significant gross hematuria passing multiple clots 2 syncopal episodes today and transported to [facility] by EMS Hemoglobin 8.9 (13.1 last week) Last Eliquis was yesterday 69 [place] three-way Foley catheter was placed and started on CBI Main complaint at present is spasmodic pain at end the penis for which he just received a B&O suppository  Past Medical History:  Diagnosis Date   A-fib ([place])    Anemia    Arthritis    BACK   Bladder cancer (HCC)    BPH (benign prostatic hyperplasia)    Cancer (HCC)    skin cancer on face carcinoma   ED (erectile dysfunction)    Rosanna Randy disease    HLD (hyperlipidemia)    Hypertension    Macular degeneration    Mollusca contagiosa    Osteopenia    Presence of permanent cardiac pacemaker    Sensorineural hearing loss    Squamous cell carcinoma of skin 08/25/2019   R cheek    Squamous cell carcinoma of skin 02/12/2020   L neck    Squamous cell carcinoma of skin 11/30/2020   Right elbow, EDC    Past Surgical History:  Procedure Laterality Date   CATARACT EXTRACTION Bilateral    CYSTOSCOPY WITH BIOPSY N/A 01/20/2020   Procedure: CYSTOSCOPY WITH BIOPSY;  Surgeon: Abbie Sons, MD;  Location: [facility]  ORS;  Service: Urology;  Laterality: N/A;   CYSTOSCOPY WITH FULGERATION N/A 01/20/2020   Procedure: CYSTOSCOPY WITH FULGERATION;  Surgeon: Abbie Sons, MD;  Location: [facility] ORS;  Service: Urology;  Laterality: N/A;   CYSTOSCOPY WITH FULGERATION N/A 04/19/2020   Procedure: [place] with clot evacuation;  Surgeon: Abbie Sons, MD;  Location: [facility] ORS;  Service: Urology;  Laterality: N/A;   HEMORRHOIDECTOMY WITH HEMORRHOID BANDING     KNEE ARTHROSCOPY Left    PACEMAKER INSERTION N/A 03/03/2015   Procedure: INSERTION DUAL LEAD PACEMAKER;  Surgeon: Isaias Cowman, MD;  Location: [facility] ORS;  Service: Cardiovascular;  Laterality: N/A;   PULSE GENERATOR IMPLANT N/A 10/18/2020   Procedure: UNILATERAL PULSE GENERATOR IMPLANT;  Surgeon: [name], MD;  Location: [facility] ORS;  Service: Neurosurgery;  Laterality: N/A;   SPINAL CORD STIMULATOR TRIAL N/A 10/11/2020   Procedure: PERCUTANEOUS SPINAL CORD STIMULATOR TRIAL;  Surgeon: [name], MD;  Location: [facility] ORS;  Service: Neurosurgery;  Laterality: N/A;   THORACIC LAMINECTOMY FOR SPINAL CORD STIMULATOR N/A 10/18/2020   Procedure: PERCUTANEOUS LEAD PLACEMENT;  Surgeon: [name], MD;  Location: [facility] ORS;  Service: Neurosurgery;  Laterality: N/A;   TONSILLECTOMY     TRANSURETHRAL RESECTION OF BLADDER TUMOR N/A 10/07/2019   Procedure: TRANSURETHRAL RESECTION OF BLADDER TUMOR (TURBT);  Surgeon: Abbie Sons, MD;  Location: [facility] ORS;  Service: Urology;  Laterality: N/A;   TRANSURETHRAL RESECTION OF BLADDER  TUMOR N/A 04/13/2020   Procedure: TRANSURETHRAL RESECTION OF BLADDER TUMOR (TURBT);  Surgeon: Abbie Sons, MD;  Location: [facility] ORS;  Service: Urology;  Laterality: N/A;    Home Medications:  Current Meds  Medication Sig   alfuzosin (UROXATRAL) 10 MG 24 hr tablet Take 1 tablet (10 mg total) by mouth daily with breakfast.   apixaban (ELIQUIS) 5 MG TABS tablet Take 5 mg by mouth 2 (two) times daily.   atorvastatin  (LIPITOR) 10 MG tablet Take 10 mg by mouth in the morning.   calcium carbonate (OSCAL) 1500 (600 Ca) MG TABS tablet Take 600 mg of elemental calcium by mouth in the morning.   Cholecalciferol (VITAMIN D3) 50 MCG (2000 UT) TABS Take 2,000 Units by mouth in the morning.   ferrous sulfate 325 (65 FE) MG tablet Take 325 mg by mouth in the morning and at bedtime.   finasteride (PROSCAR) 5 MG tablet TAKE 1 TABLET DAILY (Patient taking differently: Take 5 mg by mouth daily.)   metoprolol succinate (TOPROL-XL) 25 MG 24 hr tablet Take 25 mg by mouth in the morning. (Take with 50mg  tablet to equal 75mg  total)   metoprolol succinate (TOPROL-XL) 50 MG 24 hr tablet Take 50 mg by mouth in the morning. (Take with 25mg  tablet to equal 75mg  total)   Multiple Vitamin (MULTIVITAMIN WITH MINERALS) TABS tablet Take 1 tablet by mouth in the morning.   Multiple Vitamins-Minerals (PRESERVISION AREDS 2 PO) Take 1 tablet by mouth in the morning and at bedtime.   Omega-3 Fatty Acids (FISH OIL) 1000 MG CAPS Take 1,000 mg by mouth daily.   tamsulosin (FLOMAX) 0.4 MG CAPS capsule Take 0.4 mg by mouth daily.    Allergies:  Allergies  Allergen Reactions   Atenolol     Happened a long time ago and can't recall    Family History  Problem Relation Age of Onset   Diabetes Mother    Lung cancer Father    Cancer Paternal Uncle    Prostate cancer Neg Hx    Bladder Cancer Neg Hx    Kidney cancer Neg Hx     Social History:  reports that he has never smoked. He has never used smokeless tobacco. He reports current alcohol use of about 7.0 standard drinks of alcohol per week. He reports that he does not use drugs.  ROS: A complete review of systems was performed.  All systems are negative except for pertinent findings as noted.  Physical Exam:  Vital signs in last 24 hours: Temp:  [97.9 F (36.6 C)] 97.9 F (36.6 C) (06/27 1256) Pulse Rate:  [59-72] 62 (06/27 1600) Resp:  [15-18] 15 (06/27 1600) BP: (102-128)/(51-74)  102/74 (06/27 1600) SpO2:  [99 %-100 %] 100 % (06/27 1600) Weight:  [88.5 kg] 88.5 kg (06/27 1253) Constitutional:  Alert and oriented, No acute distress HEENT: [place] AT, moist mucus membranes.  Trachea midline, no masses Respiratory: Normal respiratory effort, lungs clear bilaterally GI: Abdomen is soft, nontender, nondistended, no abdominal masses GU: CBI effluent pink-tinged on moderate flow Skin: No rashes, bruises or suspicious lesions Lymph: No cervical or inguinal adenopathy Neurologic: Grossly intact, no focal deficits, moving all 4 extremities Psychiatric: Normal mood and affect   Laboratory Data:  Recent Labs    01/03/21 1257 01/03/21 1614  WBC 7.9 7.3  HGB 8.9* 7.9*  HCT 26.7* 23.6*   Recent Labs    01/03/21 1257  NA 132*  K 4.1  CL 99  CO2 26  GLUCOSE  133*  BUN 20  CREATININE 1.07  CALCIUM 8.6*   No results for input(s): LABPT, INR in the last 72 hours. No results for input(s): LABURIN in the last 72 hours. Results for orders placed or performed during the hospital encounter of 01/03/21  Resp Panel by RT-PCR (Flu A&B, Covid) Nasopharyngeal Swab     Status: None   Collection Time: 01/03/21  3:22 PM   Specimen: Nasopharyngeal Swab; Nasopharyngeal(NP) swabs in vial transport medium  Result Value Ref Range Status   SARS Coronavirus 2 by RT PCR NEGATIVE NEGATIVE Final    Comment: (NOTE) SARS-CoV-2 target nucleic acids are NOT DETECTED.  The SARS-CoV-2 RNA is generally detectable in upper respiratory specimens during the acute phase of infection. The lowest concentration of SARS-CoV-2 viral copies this assay can detect is 138 copies/mL. A negative result does not preclude SARS-Cov-2 infection and should not be used as the sole basis for treatment or other patient management decisions. A negative result may occur with  improper specimen collection/handling, submission of specimen other than nasopharyngeal swab, presence of viral mutation(s) within the areas  targeted by this assay, and inadequate number of viral copies(<138 copies/mL). A negative result must be combined with clinical observations, patient history, and epidemiological information. The expected result is Negative.  Fact Sheet for Patients:  EntrepreneurPulse.com.au  Fact Sheet for Healthcare Providers:  IncredibleEmployment.be  This test is no t yet approved or cleared by the [place] FDA and  has been authorized for detection and/or diagnosis of SARS-CoV-2 by FDA under an Emergency Use Authorization (EUA). This EUA will remain  in effect (meaning this test can be used) for the duration of the COVID-19 declaration under Section 564(b)(1) of the Act, 21 U.S.C.section 360bbb-3(b)(1), unless the authorization is terminated  or revoked sooner.       Influenza A by PCR NEGATIVE NEGATIVE Final   Influenza B by PCR NEGATIVE NEGATIVE Final    Comment: (NOTE) The Xpert Xpress SARS-CoV-2/FLU/RSV plus assay is intended as an aid in the diagnosis of influenza from Nasopharyngeal swab specimens and should not be used as a sole basis for treatment. Nasal washings and aspirates are unacceptable for Xpert Xpress SARS-CoV-2/FLU/RSV testing.  Fact Sheet for Patients: EntrepreneurPulse.com.au  Fact Sheet for Healthcare Providers: IncredibleEmployment.be  This test is not yet approved or cleared by the [place] FDA and has been authorized for detection and/or diagnosis of SARS-CoV-2 by FDA under an Emergency Use Authorization (EUA). This EUA will remain in effect (meaning this test can be used) for the duration of the COVID-19 declaration under Section 564(b)(1) of the Act, 21 U.S.C. section 360bbb-3(b)(1), unless the authorization is terminated or revoked.  Performed at [facility], [address], [place], [place] [address]      Impression/Assessment:   1.  Gross hematuria ~ 2  weeks status post TURBT/fulguration for multifocal bladder tumors with incomplete resection Presently on Eliquis and significant gross hematuria yesterday Presently on CBI with clear effluent moderate flow  Recommendation:  Continue CBI; manual irrigation as needed Follow hemoglobin and transfuse per hospitalist recommendation NPO after midnight in the event of continued hematuria and if needed will add on OR for cystoscopy with clot evacuation/fulguration Hold Eliquis   01/03/2021, 6:37 PM  John Giovanni,  MD

## 2021-01-03 NOTE — ED Notes (Signed)
Report called to Alea, RN verbalized understanding of report.  Patient is stable for transport to the admission foor/room

## 2021-01-03 NOTE — ED Triage Notes (Signed)
Pt to ER via EMS from home with c/o syncopal episode.  Pt was walking to [place] when he had syncopal episode, states happened again when they stood him to get to the EMS stretcher.  Pt states had a procedure a week ago to scrap the inside of his bladder and began passing clots last night. PT arrives A&O x 4, NAD noted.

## 2021-01-03 NOTE — H&P (Signed)
History and Physical    Christopher [name] MOL:[id number] DOB: 1927-09-19 DOA: 01/03/2021  PCP: [name], MD   Patient coming from: Home  I have personally briefly reviewed patient's old medical records in [place]  Chief Complaint: "I passed out"  HPI: Christopher [name] is a 87 y.o. male with medical history significant for urothelial cancer of the bladder, history of atrial fibrillation on chronic anticoagulation therapy, hypertension, dyslipidemia who presents to the ER via EMS for evaluation of 2 witnessed syncopal episodes. Patient had a cystoscopy with fulguration and cryosurgery on 12/20/20 for his known urothelial cancer of the bladder.  Per his daughter he was discharged home with a Foley catheter that was removed 2 days later without any issues besides frequent urination.  1 day prior to his presentation to the ER patient developed hematuria and had passed multiple clots at home.  He had 2 syncopal episodes, one while walking to the bathroom and another witnessed by EMS as they tried to get into the stretcher. He complains of feeling dizzy and lightheaded and complains of severe pain around the penile head. He denies having any chest pain, no shortness of breath, no nausea, no vomiting, no palpitations, no diaphoresis, no headache, no blurred vision, no focal deficits, no abdominal pain. Labs show sodium 132, potassium 4.1, chloride 99, bicarb 26, glucose 133, BUN 20, creatinine 1.07, calcium 8.6, white count 7.9, hemoglobin 8.9 compared to baseline of 13.1 a week ago, hematocrit 26.7, MCV 97.1, RDW 13.2, platelet count 227 Respiratory viral panel is negative Twelve-lead EKG reviewed by me shows sinus rhythm with PVCs.   ED Course: Patient is a 87 year old male with a history of urothelial cancer of the bladder status post recent cystoscopy with fulguration and cryosurgery.  He presents to the ER via EMS for evaluation of 2 witnessed syncopal episodes as well as gross  hematuria. CBI was initiated in the ER but patient continues to have gross hematuria.  Her He has had a significant drop in his H&H from 13.1g/dl a week ago to 8.9g/dl. He will be admitted to the hospital for further evaluation.    Review of Systems: As per HPI otherwise all other systems reviewed and negative.    Past Medical History:  Diagnosis Date   A-fib ([place])    Anemia    Arthritis    BACK   Bladder cancer (HCC)    BPH (benign prostatic hyperplasia)    Cancer (HCC)    skin cancer on face carcinoma   ED (erectile dysfunction)    Rosanna Randy disease    HLD (hyperlipidemia)    Hypertension    Macular degeneration    Mollusca contagiosa    Osteopenia    Presence of permanent cardiac pacemaker    Sensorineural hearing loss    Squamous cell carcinoma of skin 08/25/2019   R cheek    Squamous cell carcinoma of skin 02/12/2020   L neck    Squamous cell carcinoma of skin 11/30/2020   Right elbow, EDC    Past Surgical History:  Procedure Laterality Date   CATARACT EXTRACTION Bilateral    CYSTOSCOPY WITH BIOPSY N/A 01/20/2020   Procedure: CYSTOSCOPY WITH BIOPSY;  Surgeon: Abbie Sons, MD;  Location: [facility] ORS;  Service: Urology;  Laterality: N/A;   CYSTOSCOPY WITH FULGERATION N/A 01/20/2020   Procedure: CYSTOSCOPY WITH FULGERATION;  Surgeon: Abbie Sons, MD;  Location: [facility] ORS;  Service: Urology;  Laterality: N/A;   CYSTOSCOPY WITH FULGERATION N/A 04/19/2020  Procedure: [place] with clot evacuation;  Surgeon: Abbie Sons, MD;  Location: [facility] ORS;  Service: Urology;  Laterality: N/A;   HEMORRHOIDECTOMY WITH HEMORRHOID BANDING     KNEE ARTHROSCOPY Left    PACEMAKER INSERTION N/A 03/03/2015   Procedure: INSERTION DUAL LEAD PACEMAKER;  Surgeon: Isaias Cowman, MD;  Location: [facility] ORS;  Service: Cardiovascular;  Laterality: N/A;   PULSE GENERATOR IMPLANT N/A 10/18/2020   Procedure: UNILATERAL PULSE GENERATOR IMPLANT;  Surgeon: [name], MD;   Location: [facility] ORS;  Service: Neurosurgery;  Laterality: N/A;   SPINAL CORD STIMULATOR TRIAL N/A 10/11/2020   Procedure: PERCUTANEOUS SPINAL CORD STIMULATOR TRIAL;  Surgeon: [name], MD;  Location: [facility] ORS;  Service: Neurosurgery;  Laterality: N/A;   THORACIC LAMINECTOMY FOR SPINAL CORD STIMULATOR N/A 10/18/2020   Procedure: PERCUTANEOUS LEAD PLACEMENT;  Surgeon: [name], MD;  Location: [facility] ORS;  Service: Neurosurgery;  Laterality: N/A;   TONSILLECTOMY     TRANSURETHRAL RESECTION OF BLADDER TUMOR N/A 10/07/2019   Procedure: TRANSURETHRAL RESECTION OF BLADDER TUMOR (TURBT);  Surgeon: Abbie Sons, MD;  Location: [facility] ORS;  Service: Urology;  Laterality: N/A;   TRANSURETHRAL RESECTION OF BLADDER TUMOR N/A 04/13/2020   Procedure: TRANSURETHRAL RESECTION OF BLADDER TUMOR (TURBT);  Surgeon: Abbie Sons, MD;  Location: [facility] ORS;  Service: Urology;  Laterality: N/A;     reports that he has never smoked. He has never used smokeless tobacco. He reports current alcohol use of about 7.0 standard drinks of alcohol per week. He reports that he does not use drugs.  Allergies  Allergen Reactions   Atenolol     Happened a long time ago and can't recall    Family History  Problem Relation Age of Onset   Diabetes Mother    Lung cancer Father    Cancer Paternal Uncle    Prostate cancer Neg Hx    Bladder Cancer Neg Hx    Kidney cancer Neg Hx       Prior to Admission medications   Medication Sig Start Date End Date Taking? Authorizing Provider  alfuzosin (UROXATRAL) 10 MG 24 hr tablet Take 1 tablet (10 mg total) by mouth daily with breakfast. 08/21/19  Yes Stoioff, Ronda Fairly, MD  apixaban (ELIQUIS) 5 MG TABS tablet Take 5 mg by mouth 2 (two) times daily.   Yes [provider]  atorvastatin (LIPITOR) 10 MG tablet Take 10 mg by mouth in the morning. 11/18/14  Yes [provider]  calcium carbonate (OSCAL) 1500 (600 Ca) MG TABS tablet Take 600 mg of elemental calcium by  mouth in the morning.   Yes [provider]  Cholecalciferol (VITAMIN D3) 50 MCG (2000 UT) TABS Take 2,000 Units by mouth in the morning.   Yes [provider]  ferrous sulfate 325 (65 FE) MG tablet Take 325 mg by mouth in the morning and at bedtime.   Yes [provider]  finasteride (PROSCAR) 5 MG tablet TAKE 1 TABLET DAILY Patient taking differently: Take 5 mg by mouth daily. 12/02/20  Yes Stoioff, Ronda Fairly, MD  metoprolol succinate (TOPROL-XL) 25 MG 24 hr tablet Take 25 mg by mouth in the morning. (Take with 50mg  tablet to equal 75mg  total)   Yes [provider]  metoprolol succinate (TOPROL-XL) 50 MG 24 hr tablet Take 50 mg by mouth in the morning. (Take with 25mg  tablet to equal 75mg  total) 03/22/19  Yes [provider]  Multiple Vitamin (MULTIVITAMIN WITH MINERALS) TABS tablet Take 1 tablet  by mouth in the morning.   Yes [provider]  Multiple Vitamins-Minerals (PRESERVISION AREDS 2 PO) Take 1 tablet by mouth in the morning and at bedtime.   Yes [provider]  Omega-3 Fatty Acids (FISH OIL) 1000 MG CAPS Take 1,000 mg by mouth daily.   Yes [provider]  tamsulosin (FLOMAX) 0.4 MG CAPS capsule Take 0.4 mg by mouth daily.   Yes [provider]  traMADol (ULTRAM) 50 MG tablet Take 1 tablet (50 mg total) by mouth every 8 (eight) hours as needed for severe pain. Patient not taking: No sig reported 10/18/20 10/18/21  [name], MD    Physical Exam: Vitals:   01/03/21 1256 01/03/21 1500 01/03/21 1530 01/03/21 1600  BP: 122/63 (!) 118/51 (!) 128/51 102/74  Pulse: 70 (!) 59 72 62  Resp: 18 15 18 15   Temp: 97.9 F (36.6 C)     TempSrc: Oral     SpO2: 100% 100% 99% 100%  Weight:      Height:         Vitals:   01/03/21 1256 01/03/21 1500 01/03/21 1530 01/03/21 1600  BP: 122/63 (!) 118/51 (!) 128/51 102/74  Pulse: 70 (!) 59 72 62  Resp: 18 15 18 15   Temp: 97.9 F (36.6 C)     TempSrc: Oral     SpO2:  100% 100% 99% 100%  Weight:      Height:          Constitutional: Alert and oriented x 3 . Appears uncomfortable HEENT:      Head: Normocephalic and atraumatic.         Eyes: PERLA, EOMI, Conjunctivae pallor. Sclera is non-icteric.       Mouth/Throat: Mucous membranes are moist.       Neck: Supple with no signs of meningismus. Cardiovascular: Regular rate and rhythm. No murmurs, gallops, or rubs. 2+ symmetrical distal pulses are present . No JVD. No LE edema Respiratory: Respiratory effort normal .Lungs sounds clear bilaterally. No wheezes, crackles, or rhonchi.  Gastrointestinal: Soft, non tender, and non distended with positive bowel sounds.  Genitourinary: No CVA tenderness. Foley Catheter in place draining bloody urine Musculoskeletal: Nontender with normal range of motion in all extremities. No cyanosis, or erythema of extremities. Neurologic:  Face is symmetric. Moving all extremities. No gross focal neurologic deficits . Skin: Skin is warm, dry.  No rash or ulcers Psychiatric: Mood and affect are normal    Labs on Admission: I have personally reviewed following labs and imaging studies  CBC: Recent Labs  Lab 12/28/20 1342 01/03/21 1257 01/03/21 1614  WBC 5.7 7.9 7.3  NEUTROABS 3.9  --   --   HGB 13.1 8.9* 7.9*  HCT 39.3 26.7* 23.6*  MCV 97.8 97.1 97.1  PLT 238 227 762   Basic Metabolic Panel: Recent Labs  Lab 12/28/20 1342 01/03/21 1257  NA 137 132*  K 4.2 4.1  CL 102 99  CO2 27 26  GLUCOSE 112* 133*  BUN 14 20  CREATININE 1.13 1.07  CALCIUM 8.9 8.6*   GFR: Estimated Creatinine Clearance: 48.9 mL/min (by C-G formula based on SCr of 1.07 mg/dL). Liver Function Tests: No results for input(s): AST, ALT, ALKPHOS, BILITOT, PROT, ALBUMIN in the last 168 hours. No results for input(s): LIPASE, AMYLASE in the last 168 hours. No results for input(s): AMMONIA in the last 168 hours. Coagulation Profile: No results for input(s): INR, PROTIME in the last 168  hours. Cardiac Enzymes: No results for  input(s): CKTOTAL, CKMB, CKMBINDEX, TROPONINI in the last 168 hours. BNP (last 3 results) No results for input(s): PROBNP in the last 8760 hours. HbA1C: No results for input(s): HGBA1C in the last 72 hours. CBG: No results for input(s): GLUCAP in the last 168 hours. Lipid Profile: No results for input(s): CHOL, HDL, LDLCALC, TRIG, CHOLHDL, LDLDIRECT in the last 72 hours. Thyroid Function Tests: No results for input(s): TSH, T4TOTAL, FREET4, T3FREE, THYROIDAB in the last 72 hours. Anemia Panel: No results for input(s): VITAMINB12, FOLATE, FERRITIN, TIBC, IRON, RETICCTPCT in the last 72 hours. Urine analysis:    Component Value Date/Time   COLORURINE RED (A) 01/03/2021 1415   APPEARANCEUR TURBID (A) 01/03/2021 1415   APPEARANCEUR Cloudy (A) 03/26/2020 0905   LABSPEC 1.025 01/03/2021 1415   PHURINE  01/03/2021 1415    TEST NOT REPORTED DUE TO COLOR INTERFERENCE OF URINE PIGMENT   GLUCOSEU (A) 01/03/2021 1415    TEST NOT REPORTED DUE TO COLOR INTERFERENCE OF URINE PIGMENT   HGBUR (A) 01/03/2021 1415    TEST NOT REPORTED DUE TO COLOR INTERFERENCE OF URINE PIGMENT   BILIRUBINUR (A) 01/03/2021 1415    TEST NOT REPORTED DUE TO COLOR INTERFERENCE OF URINE PIGMENT   BILIRUBINUR Negative 03/26/2020 0905   KETONESUR (A) 01/03/2021 1415    TEST NOT REPORTED DUE TO COLOR INTERFERENCE OF URINE PIGMENT   PROTEINUR (A) 01/03/2021 1415    TEST NOT REPORTED DUE TO COLOR INTERFERENCE OF URINE PIGMENT   NITRITE (A) 01/03/2021 1415    TEST NOT REPORTED DUE TO COLOR INTERFERENCE OF URINE PIGMENT   LEUKOCYTESUR (A) 01/03/2021 1415    TEST NOT REPORTED DUE TO COLOR INTERFERENCE OF URINE PIGMENT    Radiological Exams on Admission: No results found.   Assessment/Plan Principal Problem:   Acute blood loss anemia Active Problems:   HTN (hypertension)   Benign prostatic hyperplasia with urinary frequency   Persistent atrial fibrillation (HCC)   Urothelial  carcinoma of bladder (HCC)   Gross hematuria     Acute blood loss anemia Secondary to gross hematuria from known bladder malignancy Continue CBI Check serial H&H and transfuse as needed Place patient on [place] suppository PRN for bladder spasm/penile pain Consult urology Hold apixaban    History of atrial fibrillation Hold apixaban due to acute blood loss Hold metoprolol due to relative hypotension    Hypertension Hold metoprolol due to relative hypotension    BPH Continue finasteride and Flomax   DVT prophylaxis:  SCD Code Status: full code  Family Communication: Greater than 50% of time was spent discussing patient's condition and plan of care with him and his daughter Burna Sis [facility]) at the bedside.  All questions and concerns have been addressed.  They verbalized understanding and agree with the plan. Disposition Plan: Back to previous home environment Consults called: Urology Status: At the time of admission, it appears that the appropriate admission status for this patient is inpatient. This is judged to be reasonable and necessary in order to provide the required intensity of service to ensure the patient's safety given the presenting symptoms, physical exam findings and initial radiographic and lab data in the context of their comorbid conditions. Patient requires inpatient status due to high intensity of service, high risk of further deterioration and high frequency of surveillance required.    Collier Bullock MD [facility]     01/03/2021, 4:58 PM

## 2021-01-04 DIAGNOSIS — R571 Hypovolemic shock: Secondary | ICD-10-CM

## 2021-01-04 DIAGNOSIS — N3001 Acute cystitis with hematuria: Secondary | ICD-10-CM

## 2021-01-04 DIAGNOSIS — R310 Gross hematuria: Secondary | ICD-10-CM | POA: Diagnosis not present

## 2021-01-04 DIAGNOSIS — I4819 Other persistent atrial fibrillation: Secondary | ICD-10-CM | POA: Diagnosis not present

## 2021-01-04 DIAGNOSIS — D62 Acute posthemorrhagic anemia: Secondary | ICD-10-CM | POA: Diagnosis not present

## 2021-01-04 LAB — BASIC METABOLIC PANEL
Anion gap: 4 — ABNORMAL LOW (ref 5–15)
BUN: 22 mg/dL (ref 8–23)
CO2: 28 mmol/L (ref 22–32)
Calcium: 8.4 mg/dL — ABNORMAL LOW (ref 8.9–10.3)
Chloride: 102 mmol/L (ref 98–111)
Creatinine, Ser: 1.49 mg/dL — ABNORMAL HIGH (ref 0.61–1.24)
GFR, Estimated: 44 mL/min — ABNORMAL LOW (ref >60)
Glucose, Bld: 145 mg/dL — ABNORMAL HIGH (ref 70–99)
Potassium: 5.7 mmol/L — ABNORMAL HIGH (ref 3.5–5.1)
Sodium: 134 mmol/L — ABNORMAL LOW (ref 135–145)

## 2021-01-04 LAB — PREPARE RBC (CROSSMATCH)

## 2021-01-04 LAB — HEMOGLOBIN AND HEMATOCRIT, BLOOD
HCT: 24.5 % — ABNORMAL LOW (ref 39.0–52.0)
HCT: 25.8 % — ABNORMAL LOW (ref 39.0–52.0)
HCT: 23.3 % — ABNORMAL LOW (ref 39.0–52.0)
HCT: 29 % — ABNORMAL LOW (ref 39.0–52.0)
Hemoglobin: 8.2 g/dL — ABNORMAL LOW (ref 13.0–17.0)
Hemoglobin: 8.5 g/dL — ABNORMAL LOW (ref 13.0–17.0)
Hemoglobin: 7.8 g/dL — ABNORMAL LOW (ref 13.0–17.0)
Hemoglobin: 9.5 g/dL — ABNORMAL LOW (ref 13.0–17.0)

## 2021-01-04 LAB — IRON AND TIBC
Iron: 19 ug/dL — ABNORMAL LOW (ref 45–182)
Saturation Ratios: 6 % — ABNORMAL LOW (ref 17.9–39.5)
TIBC: 333 ug/dL (ref 250–450)
UIBC: 314 ug/dL

## 2021-01-04 LAB — POTASSIUM: Potassium: 5.3 mmol/L — ABNORMAL HIGH (ref 3.5–5.1)

## 2021-01-04 LAB — VITAMIN B12: Vitamin B-12: 627 pg/mL (ref 180–914)

## 2021-01-04 MED ORDER — SODIUM CHLORIDE 0.9 % IV SOLN
300.0000 mg | Freq: Once | INTRAVENOUS | Status: AC
  Administered 2021-01-04: 300 mg via INTRAVENOUS
  Filled 2021-01-04: qty 15

## 2021-01-04 MED ORDER — SODIUM CHLORIDE 0.9 % IV SOLN: INTRAVENOUS | Status: DC

## 2021-01-04 MED ORDER — SODIUM CHLORIDE 0.9 % IV BOLUS
500.0000 mL | Freq: Once | INTRAVENOUS | Status: AC | PRN
Start: 2021-01-04 — End: 2021-01-04
  Administered 2021-01-04: 500 mL via INTRAVENOUS

## 2021-01-04 MED ORDER — HYDROMORPHONE HCL 1 MG/ML IJ SOLN
0.5000 mg | INTRAMUSCULAR | Status: DC | PRN
  Administered 2021-01-04 – 2021-01-05 (×11): 0.5 mg via INTRAVENOUS
  Filled 2021-01-04 (×10): qty 1

## 2021-01-04 MED ORDER — SODIUM CHLORIDE 0.9% IV SOLUTION: Freq: Once | INTRAVENOUS | Status: AC

## 2021-01-04 MED ORDER — SODIUM POLYSTYRENE SULFONATE 15 GM/60ML PO SUSP
30.0000 g | Freq: Once | ORAL | Status: AC
  Administered 2021-01-04: 30 g via ORAL
  Filled 2021-01-04: qty 120

## 2021-01-04 MED ORDER — FUROSEMIDE 10 MG/ML IJ SOLN
20.0000 mg | Freq: Once | INTRAMUSCULAR | Status: AC
  Administered 2021-01-04: 20 mg via INTRAVENOUS
  Filled 2021-01-04: qty 2

## 2021-01-04 MED ORDER — HYDROMORPHONE HCL 1 MG/ML IJ SOLN
1.0000 mg | Freq: Once | INTRAMUSCULAR | Status: AC
  Administered 2021-01-04: 1 mg via INTRAVENOUS
  Filled 2021-01-04: qty 1

## 2021-01-04 MED ORDER — HYDROMORPHONE HCL 1 MG/ML IJ SOLN
INTRAMUSCULAR | Status: AC
  Filled 2021-01-04: qty 1

## 2021-01-04 MED ORDER — CHLORHEXIDINE GLUCONATE CLOTH 2 % EX PADS
6.0000 | MEDICATED_PAD | Freq: Every day | CUTANEOUS | Status: DC
  Administered 2021-01-04 – 2021-01-07 (×3): 6 via TOPICAL

## 2021-01-04 NOTE — Progress Notes (Signed)
Critical care note:  Date of note: 01/04/2021  Subjective: Rapid response was called as the patient was lethargic, pale with persistent penile pain and questionable leaking with his CBI in addition to hypotension with a blood pressure that was down to 76/48.  Objective: Physical examination: Generally: Acutely ill elderly Caucasian male in no acute respiratory distress. Vital signs: Heart rate was 116 and respiratory 23 with temperature of 96 and normal pulse oximetry. Head - atraumatic, normocephalic.  Pupils - equal, round and reactive to light and accommodation. Extraocular movements are intact.  Positive pallor.  No scleral icterus.  Oropharynx - moist mucous membranes and tongue. No pharyngeal erythema or exudate.  Neck - supple. No JVD. Carotid pulses 2+ bilaterally. No carotid bruits. No palpable thyromegaly or lymphadenopathy. Cardiovascular - regular rate and rhythm. Normal S1 and S2. No murmurs, gallops or rubs.  Lungs - clear to auscultation bilaterally.  Abdomen - soft and nontender. Positive bowel sounds. No palpable organomegaly or masses.  Extremities - no pitting edema, clubbing or cyanosis.  Neuro - grossly non-focal. Skin - no rashes. GU exam revealed in CBI with significant mount of bleeding, status post irrigation.  Labs and notes were reviewed.  Assessment/plan: 1.  Hypotension and shock, possibly secondary to hematuria. - The patient was given wide-open normal saline bolus of 500 mL.  BP has improved to 107/56 and heart rate from 130 to 160. - He had an irrigation of his CBI with subsequent clearing of his urine after expulsion of significant amount of blood. - We will monitor H&H. - Urology is following with [place]. - We will closely monitor his blood pressure which is currently improving.  Authorized and performed by: Eugenie Norrie, MD Total critical care time: Approximately    30   minutes. Due to a high probability of clinically significant, life-threatening  deterioration, the patient required my highest level of preparedness to intervene emergently and I personally spent this critical care time directly and personally managing the patient.  This critical care time included obtaining a history, examining the patient, pulse oximetry, ordering and review of studies, arranging urgent treatment with development of management plan, evaluation of patient's response to treatment, frequent reassessment, and discussions with other providers. This critical care time was performed to assess and manage the high probability of imminent, life-threatening deterioration that could result in multiorgan failure.  It was exclusive of separately billable procedures and treating other patients and teaching time.  Please see MDM section and the rest of the note for further information on patient assessment and treatment.

## 2021-01-04 NOTE — Progress Notes (Cosign Needed)
Pt. Asleep and not able to take PO meds at this time.

## 2021-01-04 NOTE — Progress Notes (Signed)
O2 sats are dropping as low as 70% while sleeping. When patient is awake his O2 sats are 98%. Placed on 3 liters nasal cannula. Will continue to monitor.

## 2021-01-04 NOTE — Progress Notes (Signed)
Urology Consult Follow Up  Subjective: Rapid response called earlier this morning due to hypotension which responded to fluids.  Nursing staff irrigated ~ 120 cc of clot.  CBI on rapid inflow to keep effluent clear  Complaining of spasmodic penile pain  Hemoglobin this a.m. 8.9  Anti-infectives: Anti-infectives (From admission, onward)    None       Current Facility-Administered Medications  Medication Dose Route Frequency Provider Last Rate Last Admin   acetaminophen (TYLENOL) tablet 650 mg  650 mg Oral Q6H PRN Agbata, Tochukwu, MD       Or   acetaminophen (TYLENOL) suppository 650 mg  650 mg Rectal Q6H PRN Agbata, Tochukwu, MD       alfuzosin (UROXATRAL) 24 hr tablet 10 mg  10 mg Oral Q breakfast Agbata, Tochukwu, MD       atorvastatin (LIPITOR) tablet 10 mg  10 mg Oral Daily Agbata, Tochukwu, MD   10 mg at 01/03/21 2322   belladonna-opium (B&O) suppository 16.2-60mg   1 suppository Rectal Q6H PRN Agbata, Tochukwu, MD   1 suppository at 01/03/21 2352   calcium carbonate (OS-CAL - dosed in mg of elemental calcium) tablet 500 mg of elemental calcium  500 mg of elemental calcium Oral Q breakfast Agbata, Tochukwu, MD       Chlorhexidine Gluconate Cloth 2 % PADS 6 each  6 each Topical Daily Agbata, Tochukwu, MD       cholecalciferol (VITAMIN D3) tablet 2,000 Units  2,000 Units Oral q AM Agbata, Tochukwu, MD       ferrous sulfate tablet 325 mg  325 mg Oral Q breakfast Agbata, Tochukwu, MD       finasteride (PROSCAR) tablet 5 mg  5 mg Oral Daily Agbata, Tochukwu, MD   5 mg at 01/03/21 1923   morphine 2 MG/ML injection 1-2 mg  1-2 mg Intravenous Q4H PRN Mansy, Jan A, MD   1 mg at 01/04/21 1287   multivitamin with minerals tablet 1 tablet  1 tablet Oral q AM Agbata, Tochukwu, MD       ondansetron (ZOFRAN) tablet 4 mg  4 mg Oral Q6H PRN Agbata, Tochukwu, MD       Or   ondansetron (ZOFRAN) injection 4 mg  4 mg Intravenous Q6H PRN Agbata, Tochukwu, MD       sodium chloride irrigation 0.9 %  3,000 mL  3,000 mL Irrigation Continuous Naaman Plummer, MD 0 mL/hr at 01/03/21 1507 3,000 mL at 01/04/21 0645   tamsulosin (FLOMAX) capsule 0.4 mg  0.4 mg Oral Daily Agbata, Tochukwu, MD         Objective: Vital signs in last 24 hours: Temp:  [96 F (35.6 C)-98.1 F (36.7 C)] 97.6 F (36.4 C) (06/28 0711) Pulse Rate:  [59-122] 122 (06/28 0711) Resp:  [13-24] 18 (06/28 0711) BP: (102-192)/(51-117) 140/95 (06/28 0711) SpO2:  [96 %-100 %] 98 % (06/28 0711) Weight:  [88.5 kg-90 kg] 90 kg (06/27 2312)  Intake/Output from previous day: 06/27 0701 - 06/28 0700 In: -  Out: 86767 [MCNOB:[id number]] Intake/Output this shift: No intake/output data recorded.   Physical Exam: In moderate distress secondary to penile pain Abdomen soft, nontender CBI effluent clear on rapid inflow and when decreased becomes red  Lab Results:  Recent Labs    01/03/21 1257 01/03/21 1614 01/03/21 2321 01/04/21 0548  WBC 7.9 7.3  --   --   HGB 8.9* 7.9* 8.7* 8.2*  HCT 26.7* 23.6* 25.9* 24.5*  PLT 227 196  --   --  BMET Recent Labs    01/03/21 1257 01/04/21 0548  NA 132* 134*  K 4.1 5.7*  CL 99 102  CO2 26 28  GLUCOSE 133* 145*  BUN 20 22  CREATININE 1.07 1.49*  CALCIUM 8.6* 8.4*   PT/INR No results for input(s): LABPROT, INR in the last 72 hours. ABG No results for input(s): PHART, HCO3 in the last 72 hours.  Invalid input(s): PCO2, PO2   Assessment:  Gross hematuria status post TURBT [facility] 12/20/2020.  Resumed Eliquis 72 hours postop which has been discontinued.  Brisk CBI in flow to maintain clear effluent.  Penile pain most likely secondary to bladder spasms  Plan: Recommend cystoscopy under anesthesia with clot evacuation/fulguration.  We discussed potential risks including bleeding, infection, bladder injury as well as anesthetic risks.  He indicated all questions were answered and desires to proceed     LOS: 1 day    Abbie Sons 01/04/2021

## 2021-01-04 NOTE — Progress Notes (Signed)
Rapid follow up-  Patient resting with no complaints of pain and VSS. CBI running and charge nurse reports apropritate intake and output.

## 2021-01-04 NOTE — Progress Notes (Signed)
Urology  Cystoscopy/clot evacuation postponed secondary to hyperkalemia and tentatively scheduled tomorrow.  CBI effluent was clear on moderate flow which is improved from this morning.  I did irrigate the catheter which irrigated freely without clots.  The effluent was pink-tinged.  Recommendation: Continue CBI Manually irrigate as needed Tentatively scheduled for cystoscopy with clot evacuation 6/29 Will reassess in a.m.

## 2021-01-04 NOTE — Progress Notes (Signed)
0245 - Patient called out to get out of bed. Pt found to be pale, AMS, and unable to follow commands. Pt tachycardic and hypotensive. CBI running but had leakage around penis since arrival to the floor. Bed appeared to be soaked. Patient received several pain medications prior to this episode due to excruciating pain. Bladder manually irrigated, 90cc irrigated with 120cc returned of clots and bright red blood. Pts BP and HR improved after 500cc bolus and irrigation. Dr. Sidney Ace to bedside following episode.

## 2021-01-04 NOTE — Progress Notes (Addendum)
PROGRESS NOTE    Christopher [name]  IWP:[id number] DOB: 02-23-1928 DOA: 01/03/2021 PCP: [name], MD   Chief complaint.  Syncope. Brief Narrative:  Christopher [name] is a 87 y.o. male with medical history significant for urothelial cancer of the bladder, history of atrial fibrillation on chronic anticoagulation therapy, hypertension, dyslipidemia who presents to the ER via EMS for evaluation of 2 witnessed syncopal episodes. Patient recently had cystoscopy with fulguration and cryosurgery for bladder cancer on 6/13.  He had a significant gross hematuria with a large amount of blood clots.  Evaluated by urology, started CBI.   Assessment & Plan:   Principal Problem:   Acute blood loss anemia Active Problems:   HTN (hypertension)   Benign prostatic hyperplasia with urinary frequency   Persistent atrial fibrillation (HCC)   Urothelial carcinoma of bladder (HCC)   Gross hematuria  #1.  Acute blood loss anemia. Iron deficient anemia. Gross hematuria secondary to bladder malignancy. Patient has been evaluated by urology, initially scheduled for surgery to remove the blood clots today, due to hyperkalemia, surgery scheduled for tomorrow. Continue CBI. Hematuria seem to be improving. Will follow hemoglobin every 6 hours, transfuse as needed. Patient also has a severe iron deficiency, will give IV iron.  2.  Paroxysmal atrial fibrillation. Transient hypotension. Blood pressure is much better, continue hold Eliquis for acute blood loss anemia.  3.  Hypertension. Hold metoprolol.  #5.  Acute kidney injury. Hyponatremia. Hyperkalemia. Patient be giving Kayexalate today, started normal saline.  Hb dropped to 7.8. Due to active bleeding, will transfuse 1 unit PRBC    DVT prophylaxis: SCDs Code Status: full Family Communication: Wife at the bedside. Disposition Plan:    Status is: Inpatient  Remains inpatient appropriate because:Inpatient level of care appropriate  due to severity of illness  Dispo: The patient is from: Home              Anticipated d/c is to: Home              Patient currently is not medically stable to d/c.   Difficult to place patient No        I/O last 3 completed shifts: In: -  Out: 50539 [JQBHA:[id number]] Total I/O In: 18000 [Other:18000] Out: 21775 [Urine:21775]     Consultants:  Urology  Procedures:   Antimicrobials: None Subjective: Patient gross hematuria improving.  No abdominal pain.  Still has some bladder spasm. Denies any short of breath or cough. No chills or fever. No chest pain or palpitation. No headache dizziness.   Objective: Vitals:   01/04/21 0711 01/04/21 0730 01/04/21 0900 01/04/21 1100  BP: (!) 140/95 (!) 116/57 (!) 120/57 (!) 125/52  Pulse: (!) 122 (!) 118 86 81  Resp: 18 20 10 10   Temp: 97.6 F (36.4 C) 98.6 F (37 C) 97.7 F (36.5 C) 97.8 F (36.6 C)  TempSrc:   Oral Oral  SpO2: 98% 100% 96% 97%  Weight:      Height:        Intake/Output Summary (Last 24 hours) at 01/04/2021 1350 Last data filed at 01/04/2021 1330 Gross per 24 hour  Intake 18000 ml  Output 39025 ml  Net -21025 ml   Filed Weights   01/03/21 1253 01/03/21 2312  Weight: 88.5 kg 90 kg    Examination:  General exam: Appears calm and comfortable  Respiratory system: Clear to auscultation. Respiratory effort normal. Cardiovascular system: Irregular no JVD, murmurs, rubs, gallops or clicks. No pedal edema. Gastrointestinal system:  Abdomen is nondistended, soft and nontender. No organomegaly or masses felt. Normal bowel sounds heard. Central nervous system: Alert and oriented. No focal neurological deficits. Extremities: Symmetric 5 x 5 power. Skin: No rashes, lesions or ulcers Psychiatry: Judgement and insight appear normal. Mood & affect appropriate.     Data Reviewed: I have personally reviewed following labs and imaging studies  CBC: Recent Labs  Lab 01/03/21 1257 01/03/21 1614 01/03/21 2321  01/04/21 0548 01/04/21 1119  WBC 7.9 7.3  --   --   --   HGB 8.9* 7.9* 8.7* 8.2* 8.5*  HCT 26.7* 23.6* 25.9* 24.5* 25.8*  MCV 97.1 97.1  --   --   --   PLT 227 196  --   --   --    Basic Metabolic Panel: Recent Labs  Lab 01/03/21 1257 01/04/21 0548  NA 132* 134*  K 4.1 5.7*  CL 99 102  CO2 26 28  GLUCOSE 133* 145*  BUN 20 22  CREATININE 1.07 1.49*  CALCIUM 8.6* 8.4*   GFR: Estimated Creatinine Clearance: 35.1 mL/min (A) (by C-G formula based on SCr of 1.49 mg/dL (H)). Liver Function Tests: No results for input(s): AST, ALT, ALKPHOS, BILITOT, PROT, ALBUMIN in the last 168 hours. No results for input(s): LIPASE, AMYLASE in the last 168 hours. No results for input(s): AMMONIA in the last 168 hours. Coagulation Profile: No results for input(s): INR, PROTIME in the last 168 hours. Cardiac Enzymes: No results for input(s): CKTOTAL, CKMB, CKMBINDEX, TROPONINI in the last 168 hours. BNP (last 3 results) No results for input(s): PROBNP in the last 8760 hours. HbA1C: No results for input(s): HGBA1C in the last 72 hours. CBG: No results for input(s): GLUCAP in the last 168 hours. Lipid Profile: No results for input(s): CHOL, HDL, LDLCALC, TRIG, CHOLHDL, LDLDIRECT in the last 72 hours. Thyroid Function Tests: No results for input(s): TSH, T4TOTAL, FREET4, T3FREE, THYROIDAB in the last 72 hours. Anemia Panel: Recent Labs    01/04/21 0548  TIBC 333  IRON 19*   Sepsis Labs: No results for input(s): PROCALCITON, LATICACIDVEN in the last 168 hours.  Recent Results (from the past 240 hour(s))  Resp Panel by RT-PCR (Flu A&B, Covid) Nasopharyngeal Swab     Status: None   Collection Time: 01/03/21  3:22 PM   Specimen: Nasopharyngeal Swab; Nasopharyngeal(NP) swabs in vial transport medium  Result Value Ref Range Status   SARS Coronavirus 2 by RT PCR NEGATIVE NEGATIVE Final    Comment: (NOTE) SARS-CoV-2 target nucleic acids are NOT DETECTED.  The SARS-CoV-2 RNA is generally  detectable in upper respiratory specimens during the acute phase of infection. The lowest concentration of SARS-CoV-2 viral copies this assay can detect is 138 copies/mL. A negative result does not preclude SARS-Cov-2 infection and should not be used as the sole basis for treatment or other patient management decisions. A negative result may occur with  improper specimen collection/handling, submission of specimen other than nasopharyngeal swab, presence of viral mutation(s) within the areas targeted by this assay, and inadequate number of viral copies(<138 copies/mL). A negative result must be combined with clinical observations, patient history, and epidemiological information. The expected result is Negative.  Fact Sheet for Patients:  EntrepreneurPulse.com.au  Fact Sheet for Healthcare Providers:  IncredibleEmployment.be  This test is no t yet approved or cleared by the [place] FDA and  has been authorized for detection and/or diagnosis of SARS-CoV-2 by FDA under an Emergency Use Authorization (EUA). This EUA will remain  in  effect (meaning this test can be used) for the duration of the COVID-19 declaration under Section 564(b)(1) of the Act, 21 U.S.C.section 360bbb-3(b)(1), unless the authorization is terminated  or revoked sooner.       Influenza A by PCR NEGATIVE NEGATIVE Final   Influenza B by PCR NEGATIVE NEGATIVE Final    Comment: (NOTE) The Xpert Xpress SARS-CoV-2/FLU/RSV plus assay is intended as an aid in the diagnosis of influenza from Nasopharyngeal swab specimens and should not be used as a sole basis for treatment. Nasal washings and aspirates are unacceptable for Xpert Xpress SARS-CoV-2/FLU/RSV testing.  Fact Sheet for Patients: EntrepreneurPulse.com.au  Fact Sheet for Healthcare Providers: IncredibleEmployment.be  This test is not yet approved or cleared by the [place] FDA  and has been authorized for detection and/or diagnosis of SARS-CoV-2 by FDA under an Emergency Use Authorization (EUA). This EUA will remain in effect (meaning this test can be used) for the duration of the COVID-19 declaration under Section 564(b)(1) of the Act, 21 U.S.C. section 360bbb-3(b)(1), unless the authorization is terminated or revoked.  Performed at [facility], [address] [place], [place] [address]          Radiology Studies: No results found.      Scheduled Meds:  alfuzosin  10 mg Oral Q breakfast   atorvastatin  10 mg Oral Daily   calcium carbonate  500 mg of elemental calcium Oral Q breakfast   Chlorhexidine Gluconate Cloth  6 each Topical Daily   cholecalciferol  2,000 Units Oral q AM   ferrous sulfate  325 mg Oral Q breakfast   finasteride  5 mg Oral Daily   multivitamin with minerals  1 tablet Oral q AM   tamsulosin  0.4 mg Oral Daily   Continuous Infusions:  sodium chloride     iron sucrose     sodium chloride irrigation 0 mL (01/03/21 1507)     LOS: 1 day    Time spent: 32 minutes    Sharen Hones, MD [facility]   To contact the attending provider between 7A-7P or the covering provider during after hours 7P-7A, please log into the web site [email or web address] and access using universal [place] password for that web site. If you do not have the password, please call the hospital operator.  01/04/2021, 1:50 PM

## 2021-01-04 NOTE — Significant Event (Signed)
Rapid Response Event Note   Reason for Call :  Syncope AMS HR 130s  BP70/50s  Initial Focused Assessment:  Patient lethargic not following commands Patient is pale  Patient has CBI running with a large amount of wetness in the bed. Primary nurse noted patient was complaining of extreme pain prior to this episode.  Interventions:  -hand irrigated returning several quarter size clots and right blood.  - 500MLs IV of normal saline given  - Stat H&H ordered.  After Irrigation Patient alert and oriented. Following commans. VSS  Bp 107/56 HR 116 patient reporting no pain and that he felt much better.   Dr Sidney Ace at bedside to assess patient and verbal order for STAT H&H Plan of Care:  - Monitor CBI intake and output -Monitor HGB -Monitor Vital signs    Event Summary:   MD Notified: [facility] Call [place]  Gonzella Lex, RN

## 2021-01-05 ENCOUNTER — Inpatient Hospital Stay: Payer: Medicare HMO | Admitting: Anesthesiology

## 2021-01-05 ENCOUNTER — Encounter: Payer: Self-pay | Admitting: Internal Medicine

## 2021-01-05 ENCOUNTER — Inpatient Hospital Stay: Payer: Medicare HMO

## 2021-01-05 ENCOUNTER — Encounter
Admission: EM | Disposition: A | Discharge status: Home Health | Payer: Self-pay | Source: Home / Self Care | Attending: Internal Medicine

## 2021-01-05 DIAGNOSIS — N3289 Other specified disorders of bladder: Secondary | ICD-10-CM | POA: Diagnosis not present

## 2021-01-05 DIAGNOSIS — N179 Acute kidney failure, unspecified: Secondary | ICD-10-CM

## 2021-01-05 DIAGNOSIS — R310 Gross hematuria: Secondary | ICD-10-CM | POA: Diagnosis not present

## 2021-01-05 DIAGNOSIS — J9601 Acute respiratory failure with hypoxia: Secondary | ICD-10-CM | POA: Diagnosis not present

## 2021-01-05 DIAGNOSIS — C674 Malignant neoplasm of posterior wall of bladder: Secondary | ICD-10-CM | POA: Diagnosis not present

## 2021-01-05 DIAGNOSIS — D62 Acute posthemorrhagic anemia: Secondary | ICD-10-CM | POA: Diagnosis not present

## 2021-01-05 HISTORY — PX: CYSTOSCOPY WITH FULGERATION: SHX6638

## 2021-01-05 LAB — HEMOGLOBIN AND HEMATOCRIT, BLOOD
HCT: 27 % — ABNORMAL LOW (ref 39.0–52.0)
HCT: 25.6 % — ABNORMAL LOW (ref 39.0–52.0)
Hemoglobin: 9.1 g/dL — ABNORMAL LOW (ref 13.0–17.0)
Hemoglobin: 8.5 g/dL — ABNORMAL LOW (ref 13.0–17.0)

## 2021-01-05 LAB — BASIC METABOLIC PANEL
Anion gap: 9 (ref 5–15)
BUN: 31 mg/dL — ABNORMAL HIGH (ref 8–23)
CO2: 26 mmol/L (ref 22–32)
Calcium: 8.5 mg/dL — ABNORMAL LOW (ref 8.9–10.3)
Chloride: 100 mmol/L (ref 98–111)
Creatinine, Ser: 2.42 mg/dL — ABNORMAL HIGH (ref 0.61–1.24)
GFR, Estimated: 25 mL/min — ABNORMAL LOW (ref >60)
Glucose, Bld: 153 mg/dL — ABNORMAL HIGH (ref 70–99)
Potassium: 5.1 mmol/L (ref 3.5–5.1)
Sodium: 135 mmol/L (ref 135–145)

## 2021-01-05 SURGERY — CYSTOSCOPY, WITH BLADDER FULGURATION
Anesthesia: General

## 2021-01-05 MED ORDER — PROPOFOL 10 MG/ML IV BOLUS
INTRAVENOUS | Status: DC | PRN
  Administered 2021-01-05 (×4): 10 mg via INTRAVENOUS

## 2021-01-05 MED ORDER — KETAMINE HCL 50 MG/5ML IJ SOSY
PREFILLED_SYRINGE | INTRAMUSCULAR | Status: AC
  Filled 2021-01-05: qty 5

## 2021-01-05 MED ORDER — 0.9 % SODIUM CHLORIDE (POUR BTL) OPTIME
TOPICAL | Status: DC | PRN
  Administered 2021-01-05: 500 mL

## 2021-01-05 MED ORDER — CEFAZOLIN SODIUM-DEXTROSE 2-3 GM-%(50ML) IV SOLR
INTRAVENOUS | Status: DC | PRN
  Administered 2021-01-05: 2 g via INTRAVENOUS

## 2021-01-05 MED ORDER — KETAMINE HCL 10 MG/ML IJ SOLN
INTRAMUSCULAR | Status: DC | PRN
  Administered 2021-01-05 (×3): 5 mg via INTRAVENOUS

## 2021-01-05 MED ORDER — PROPOFOL 500 MG/50ML IV EMUL
INTRAVENOUS | Status: AC
  Filled 2021-01-05: qty 50

## 2021-01-05 MED ORDER — DEXMEDETOMIDINE (PRECEDEX) IN NS 20 MCG/5ML (4 MCG/ML) IV SYRINGE
PREFILLED_SYRINGE | INTRAVENOUS | Status: AC
  Filled 2021-01-05: qty 5

## 2021-01-05 MED ORDER — FENTANYL CITRATE (PF) 100 MCG/2ML IJ SOLN
INTRAMUSCULAR | Status: DC | PRN
  Administered 2021-01-05 (×2): 25 ug via INTRAVENOUS

## 2021-01-05 MED ORDER — SODIUM CHLORIDE 0.9 % IR SOLN
Status: DC | PRN
  Administered 2021-01-05: 24000 mL via INTRAVESICAL

## 2021-01-05 MED ORDER — FUROSEMIDE 10 MG/ML IJ SOLN: 40.0000 mg | Freq: Once | INTRAMUSCULAR | Status: DC

## 2021-01-05 MED ORDER — SODIUM CHLORIDE 0.9 % IV SOLN: INTRAVENOUS | Status: DC | PRN

## 2021-01-05 MED ORDER — FENTANYL CITRATE (PF) 100 MCG/2ML IJ SOLN
INTRAMUSCULAR | Status: AC
  Filled 2021-01-05: qty 2

## 2021-01-05 MED ORDER — DILTIAZEM HCL-DEXTROSE 125-5 MG/125ML-% IV SOLN (PREMIX)
5.0000 mg/h | INTRAVENOUS | Status: DC
  Administered 2021-01-05: 5 mg/h via INTRAVENOUS
  Filled 2021-01-05 (×2): qty 125

## 2021-01-05 MED ORDER — LIDOCAINE HCL URETHRAL/MUCOSAL 2 % EX GEL
CUTANEOUS | Status: AC
  Filled 2021-01-05: qty 10

## 2021-01-05 MED ORDER — FENTANYL CITRATE (PF) 100 MCG/2ML IJ SOLN
25.0000 ug | INTRAMUSCULAR | Status: DC | PRN
Start: 2021-01-05 — End: 2021-01-05

## 2021-01-05 MED ORDER — DEXMEDETOMIDINE (PRECEDEX) IN NS 20 MCG/5ML (4 MCG/ML) IV SYRINGE
PREFILLED_SYRINGE | INTRAVENOUS | Status: DC | PRN
  Administered 2021-01-05 (×5): 4 ug via INTRAVENOUS

## 2021-01-05 MED ORDER — ONDANSETRON HCL 4 MG/2ML IJ SOLN: 4.0000 mg | Freq: Once | INTRAMUSCULAR | Status: DC | PRN

## 2021-01-05 SURGICAL SUPPLY — 22 items
BAG DRAIN CYSTO-URO LG1000N (MISCELLANEOUS) ×2 IMPLANT
BAG URINE DRAIN 2000ML AR STRL (UROLOGICAL SUPPLIES) ×2 IMPLANT
CATH FOL 3WAY LX COUV 24X75 (CATHETERS) ×2 IMPLANT
CATH URET FLEX-TIP 2 LUMEN 10F (CATHETERS) ×2 IMPLANT
ELECT BIVAP BIPO 22/24 DONUT (ELECTROSURGICAL) ×2
ELECTRD BIVAP BIPO 22/24 DONUT (ELECTROSURGICAL) ×1 IMPLANT
GAUZE 4X4 16PLY ~~LOC~~+RFID DBL (SPONGE) ×2 IMPLANT
GLOVE SURG UNDER POLY LF SZ7.5 (GLOVE) ×2 IMPLANT
GOWN STRL REUS W/ TWL LRG LVL3 (GOWN DISPOSABLE) ×1 IMPLANT
GOWN STRL REUS W/ TWL XL LVL3 (GOWN DISPOSABLE) ×1 IMPLANT
GOWN STRL REUS W/TWL LRG LVL3 (GOWN DISPOSABLE) ×1
GOWN STRL REUS W/TWL XL LVL3 (GOWN DISPOSABLE) ×1
GUIDEWIRE STR DUAL SENSOR (WIRE) ×4 IMPLANT
IV NS IRRIG 3000ML ARTHROMATIC (IV SOLUTION) ×16 IMPLANT
LOOP CUT BIPOLAR 24F LRG (ELECTROSURGICAL) ×2 IMPLANT
MANIFOLD NEPTUNE II (INSTRUMENTS) ×2 IMPLANT
PACK CYSTO AR (MISCELLANEOUS) ×2 IMPLANT
SET CYSTO W/LG BORE CLAMP LF (SET/KITS/TRAYS/PACK) ×2 IMPLANT
SHEATH URETERAL 12FRX35CM (MISCELLANEOUS) ×2 IMPLANT
SYR TOOMEY IRRIG 70ML (MISCELLANEOUS) ×2
SYRINGE TOOMEY IRRIG 70ML (MISCELLANEOUS) ×1 IMPLANT
WATER STERILE IRR 1000ML POUR (IV SOLUTION) ×2 IMPLANT

## 2021-01-05 NOTE — Progress Notes (Addendum)
PROGRESS NOTE    Christopher [name]  KDX:[id number] DOB: 1928-01-23 DOA: 01/03/2021 PCP: [name], MD   CC: Syncope Brief Narrative:  Christopher [name] is a 87 y.o. male with medical history significant for urothelial cancer of the bladder, history of atrial fibrillation on chronic anticoagulation therapy, hypertension, dyslipidemia who presents to the ER via EMS for evaluation of 2 witnessed syncopal episodes. Patient recently had cystoscopy with fulguration and cryosurgery for bladder cancer on 6/13.  He had a significant gross hematuria with a large amount of blood clots.  Evaluated by urology, started CBI.   Assessment & Plan:   Principal Problem:   Acute blood loss anemia Active Problems:   HTN (hypertension)   Benign prostatic hyperplasia with urinary frequency   Persistent atrial fibrillation (HCC)   Urothelial carcinoma of bladder (HCC)   Gross hematuria    #1.  Acute blood loss anemia. Hypovolemic shock, POA Iron deficient anemia. Gross hematuria secondary to bladder malignancy. Patient had severe hypotension at admission, improved with IVF and PRBC.  Patient hemoglobin is better today after units transfusion, he also received IV iron.  Posttransfusion he received Lasix IV. Hemoglobin today is 9.1. Patient is scheduled for surgery today. Patient does not seem to have any urinary tract infection, no leukocytosis, no fever.  2.  Paroxysmal fibrillation with rapid ventricle response. Transient hypotension. Patient no longer has any hypotension.  Patient has history of paroxysmal atrial fibrillation.  Patient heart rate went up today this morning and he converted to atrial fibrillation.  Heart rate right now is running at 100-110.  Start diltiazem before surgery, rate can be adjusted during surgery based on heart rate.  This has been communicated with Dr. Bernardo Heater. Consult cardiology, order placed, Dr. Clayborn Bigness in procedure, will notify him when available  #3.   Acute hypoxemic respiratory failure. This appears to be secondary to mild volume overload, due to acute kidney injury and rapid ventricular response from atrial fibrillation. Patient be given 40 mg of IV Lasix. However, the effect of Lasix may be limited due to obstruction from blood clots in the bladder. Anticipating better renal function after surgery removing blood clots. However, patient has high risk for surgery due to multiple medical conditions including atrial fibrillation, hypoxemia, mild volume overload, advanced age. Discussed with patient daughter, there is no other options other than surgery.  #4.  Acute kidney injury. Hyponatremia Hyperkalemia. Renal function getting worse secondary to obstruction from blood clots in the bladder.  Surgery scheduled for today. Potassium level has normalized. Anticipating improving of renal function after successful surgery.    DVT prophylaxis: SCDs Code Status: full Family Communication: Updated over the phone. Disposition Plan:    Status is: Inpatient  Remains inpatient appropriate because:Inpatient level of care appropriate due to severity of illness  Dispo: The patient is from: Home              Anticipated d/c is to: SNF              Patient currently is not medically stable to d/c.   Difficult to place patient No        I/O last 3 completed shifts: In: 97673.4 [LPFXT:024; Other:49500; IV Piggyback:265.1] Out: 09735 [Urine:93425] No intake/output data recorded.     Consultants:  Urology, cardiology  Procedures: none  Antimicrobials: None   Subjective: Patient was placed on 3 L oxygen overnight.  He had a history of atrial fibrillation, his heart rate now is between 100-115.  He also  had some short of breath, no cough. Gross hematuria seems to be better. No fever or chills. No abdominal pain or nausea vomiting. He has some confusion this morning.  Objective: Vitals:   01/04/21 1816 01/04/21 1945 01/05/21  0420 01/05/21 0600  BP: (!) 166/71 (!) 174/76 (!) 115/39 138/88  Pulse: 77 73 95   Resp: 16 17 18    Temp: 99 F (37.2 C) 98.7 F (37.1 C) 98.5 F (36.9 C)   TempSrc:  Oral    SpO2: 98% 99% 96%   Weight:      Height:        Intake/Output Summary (Last 24 hours) at 01/05/2021 0809 Last data filed at 01/05/2021 0647 Gross per 24 hour  Intake 43979.11 ml  Output 73975 ml  Net -29995.89 ml   Filed Weights   01/03/21 1253 01/03/21 2312  Weight: 88.5 kg 90 kg    Examination:  General exam: Ill-appearing, confused. Respiratory system: Small amount of crackles in the base bilaterally.Marland Kitchen Respiratory effort normal. Cardiovascular system: Irregularly irregular, tachycardic, no JVD, murmurs, rubs, gallops or clicks. No pedal edema. Gastrointestinal system: Abdomen is nondistended, soft and nontender. No organomegaly or masses felt. Normal bowel sounds heard. Central nervous system: Drowsy, disoriented to time.. No focal neurological deficits. Extremities: Symmetric 5 x 5 power. Skin: No rashes, lesions or ulcers      Data Reviewed: I have personally reviewed following labs and imaging studies  CBC: Recent Labs  Lab 01/03/21 1257 01/03/21 1614 01/03/21 2321 01/04/21 0548 01/04/21 1119 01/04/21 1550 01/04/21 2202 01/05/21 0638  WBC 7.9 7.3  --   --   --   --   --   --   HGB 8.9* 7.9*   < > 8.2* 8.5* 7.8* 9.5* 9.1*  HCT 26.7* 23.6*   < > 24.5* 25.8* 23.3* 29.0* 27.0*  MCV 97.1 97.1  --   --   --   --   --   --   PLT 227 196  --   --   --   --   --   --    < > = values in this interval not displayed.   Basic Metabolic Panel: Recent Labs  Lab 01/03/21 1257 01/04/21 0548 01/04/21 1550 01/05/21 0638  NA 132* 134*  --  135  K 4.1 5.7* 5.3* 5.1  CL 99 102  --  100  CO2 26 28  --  26  GLUCOSE 133* 145*  --  153*  BUN 20 22  --  31*  CREATININE 1.07 1.49*  --  2.42*  CALCIUM 8.6* 8.4*  --  8.5*   GFR: Estimated Creatinine Clearance: 21.6 mL/min (A) (by C-G formula  based on SCr of 2.42 mg/dL (H)). Liver Function Tests: No results for input(s): AST, ALT, ALKPHOS, BILITOT, PROT, ALBUMIN in the last 168 hours. No results for input(s): LIPASE, AMYLASE in the last 168 hours. No results for input(s): AMMONIA in the last 168 hours. Coagulation Profile: No results for input(s): INR, PROTIME in the last 168 hours. Cardiac Enzymes: No results for input(s): CKTOTAL, CKMB, CKMBINDEX, TROPONINI in the last 168 hours. BNP (last 3 results) No results for input(s): PROBNP in the last 8760 hours. HbA1C: No results for input(s): HGBA1C in the last 72 hours. CBG: No results for input(s): GLUCAP in the last 168 hours. Lipid Profile: No results for input(s): CHOL, HDL, LDLCALC, TRIG, CHOLHDL, LDLDIRECT in the last 72 hours. Thyroid Function Tests: No results for input(s): TSH, T4TOTAL, FREET4, T3FREE,  THYROIDAB in the last 72 hours. Anemia Panel: Recent Labs    01/04/21 0548 01/04/21 0809  VITAMINB12  --  627  TIBC 333  --   IRON 19*  --    Sepsis Labs: No results for input(s): PROCALCITON, LATICACIDVEN in the last 168 hours.  Recent Results (from the past 240 hour(s))  Resp Panel by RT-PCR (Flu A&B, Covid) Nasopharyngeal Swab     Status: None   Collection Time: 01/03/21  3:22 PM   Specimen: Nasopharyngeal Swab; Nasopharyngeal(NP) swabs in vial transport medium  Result Value Ref Range Status   SARS Coronavirus 2 by RT PCR NEGATIVE NEGATIVE Final    Comment: (NOTE) SARS-CoV-2 target nucleic acids are NOT DETECTED.  The SARS-CoV-2 RNA is generally detectable in upper respiratory specimens during the acute phase of infection. The lowest concentration of SARS-CoV-2 viral copies this assay can detect is 138 copies/mL. A negative result does not preclude SARS-Cov-2 infection and should not be used as the sole basis for treatment or other patient management decisions. A negative result may occur with  improper specimen collection/handling, submission of  specimen other than nasopharyngeal swab, presence of viral mutation(s) within the areas targeted by this assay, and inadequate number of viral copies(<138 copies/mL). A negative result must be combined with clinical observations, patient history, and epidemiological information. The expected result is Negative.  Fact Sheet for Patients:  EntrepreneurPulse.com.au  Fact Sheet for Healthcare Providers:  IncredibleEmployment.be  This test is no t yet approved or cleared by the [place] FDA and  has been authorized for detection and/or diagnosis of SARS-CoV-2 by FDA under an Emergency Use Authorization (EUA). This EUA will remain  in effect (meaning this test can be used) for the duration of the COVID-19 declaration under Section 564(b)(1) of the Act, 21 U.S.C.section 360bbb-3(b)(1), unless the authorization is terminated  or revoked sooner.       Influenza A by PCR NEGATIVE NEGATIVE Final   Influenza B by PCR NEGATIVE NEGATIVE Final    Comment: (NOTE) The Xpert Xpress SARS-CoV-2/FLU/RSV plus assay is intended as an aid in the diagnosis of influenza from Nasopharyngeal swab specimens and should not be used as a sole basis for treatment. Nasal washings and aspirates are unacceptable for Xpert Xpress SARS-CoV-2/FLU/RSV testing.  Fact Sheet for Patients: EntrepreneurPulse.com.au  Fact Sheet for Healthcare Providers: IncredibleEmployment.be  This test is not yet approved or cleared by the [place] FDA and has been authorized for detection and/or diagnosis of SARS-CoV-2 by FDA under an Emergency Use Authorization (EUA). This EUA will remain in effect (meaning this test can be used) for the duration of the COVID-19 declaration under Section 564(b)(1) of the Act, 21 U.S.C. section 360bbb-3(b)(1), unless the authorization is terminated or revoked.  Performed at [facility], [address] [place], [place] [address]          Radiology Studies: DG OR UROLOGY CYSTO IMAGE ([place])  Result Date: 01/05/2021 There is no interpretation for this exam.  This order is for images obtained during a surgical procedure.  Please See "Surgeries" Tab for more information regarding the procedure.        Scheduled Meds:  alfuzosin  10 mg Oral Q breakfast   atorvastatin  10 mg Oral Daily   calcium carbonate  500 mg of elemental calcium Oral Q breakfast   Chlorhexidine Gluconate Cloth  6 each Topical Daily   cholecalciferol  2,000 Units Oral q AM   ferrous sulfate  325 mg Oral Q  breakfast   finasteride  5 mg Oral Daily   multivitamin with minerals  1 tablet Oral q AM   tamsulosin  0.4 mg Oral Daily   Continuous Infusions:  diltiazem (CARDIZEM) infusion     sodium chloride irrigation 0 mL (01/03/21 1507)     LOS: 2 days    Time spent: 36 minutes    Sharen Hones, MD [facility]   To contact the attending provider between 7A-7P or the covering provider during after hours 7P-7A, please log into the web site [email or web address] and access using universal [place] password for that web site. If you do not have the password, please call the hospital operator.  01/05/2021, 8:09 AM

## 2021-01-05 NOTE — Progress Notes (Signed)
Spinal Cord Stimulator turned off, patient controller sent to PACU.

## 2021-01-05 NOTE — Anesthesia Preprocedure Evaluation (Signed)
Anesthesia Evaluation  Patient identified by MRN, date of birth, ID band Patient awake    Reviewed: Allergy & Precautions, H&P , NPO status , Patient's Chart, lab work & pertinent test results, reviewed documented beta blocker date and time   Airway Mallampati: II  TM Distance: >3 FB Neck ROM: full    Dental  (+) Teeth Intact   Pulmonary neg pulmonary ROS,    Pulmonary exam normal        Cardiovascular Exercise Tolerance: Poor hypertension, On Medications Normal cardiovascular exam+ pacemaker  Rate:Normal     Neuro/Psych negative neurological ROS  negative psych ROS   GI/Hepatic negative GI ROS, Neg liver ROS,   Endo/Other  negative endocrine ROS  Renal/GU Renal disease  negative genitourinary   Musculoskeletal   Abdominal   Peds  Hematology  (+) Blood dyscrasia, anemia ,   Anesthesia Other Findings   Reproductive/Obstetrics negative OB ROS                             Anesthesia Physical Anesthesia Plan  ASA: 3  Anesthesia Plan: General LMA   Post-op Pain Management:    Induction:   PONV Risk Score and Plan: 3  Airway Management Planned:   Additional Equipment:   Intra-op Plan:   Post-operative Plan:   Informed Consent: I have reviewed the patients History and Physical, chart, labs and discussed the procedure including the risks, benefits and alternatives for the proposed anesthesia with the patient or authorized representative who has indicated his/her understanding and acceptance.       Plan Discussed with: CRNA  Anesthesia Plan Comments:         Anesthesia Quick Evaluation

## 2021-01-05 NOTE — Transfer of Care (Signed)
Immediate Anesthesia Transfer of Care Note  Patient: Christopher [name]  Procedure(s) Performed: [place]  Patient Location: PACU  Anesthesia Type:General  Level of Consciousness: drowsy  Airway & Oxygen Therapy: Patient Spontanous Breathing and Patient connected to face mask oxygen  Post-op Assessment: Report given to RN  Post vital signs: stable  Last Vitals:  Vitals Value Taken Time  BP 94/27 01/05/21 1130  Temp    Pulse 69 01/05/21 1131  Resp 16 01/05/21 1131  SpO2 94 % 01/05/21 1131  Vitals shown include unvalidated device data.  Last Pain:  Vitals:   01/05/21 0942  TempSrc: Temporal  PainSc: 0-No pain         Complications: No notable events documented.

## 2021-01-05 NOTE — Op Note (Signed)
Preoperative diagnosis:  Gross hematuria High-grade urothelial carcinoma bladder  Postoperative diagnosis:  Same  Procedure: Cystoscopy with clot evacuation Bladder fulguration  Surgeon: Abbie Sons, MD  Anesthesia: MAC  Complications: None  Intraoperative findings:  Urethra normal in caliber without stricture; prostate with mild lateral lobe enlargement Abundant thick LUTS in the bladder which was difficult to irrigate and 1 large clot required resection into smaller segments which could be irrigated from the bladder Ureteral orifices were identified and effluxing clear urine Left bladder wall diverticulum with some oozing at the neck; small amount of oozing posterior wall  EBL: Minimal  Specimens: None  Indication: Christopher [name] is a 87 y.o. male with a history of recurrent T1 high-grade urothelial carcinoma the bladder.  Status post TURBT at [facility] 12/20/2020 with findings of multifocal tumor burden who underwent resection and the bulking of the large tumors on the posterior bladder wall.  He resumed Eliquis ~ 3 days postop and presented to the ED 6/27 with a 24-hour history of gross hematuria with clots.  He has had continued bleeding on CBI and presents for cystoscopy under anesthesia with clot evacuation/fulguration.  After reviewing the management options for treatment, he elected to proceed with the above surgical procedure(s). We have discussed the potential benefits and risks of the procedure, side effects of the proposed treatment, the likelihood of the patient achieving the goals of the procedure, and any potential problems that might occur during the procedure or recuperation. Informed consent has been obtained.  Description of procedure:  The patient was taken to the operating room and general anesthesia was induced.  The patient was placed in the dorsal lithotomy position, prepped and draped in the usual sterile fashion, and preoperative antibiotics were  administered. A preoperative time-out was performed.   A 24 French resectoscope sheath with obturator was lubricated and passed per urethra.  The [place] percent scope with button electrode was then placed in the sheath where a large amount of clot was visualized.  Due to clot volume and organized clot it took extended period of time to irrigate from the bladder.  The 24 French resectoscope sheath was exchanged for a 26 French resectoscope sheath.  1 large clot was resected and the smaller segments which were then irrigated.  After complete clot evacuation panendoscopy was then performed with findings as described above.  The oozing area at the neck of the left wall diverticulum was fulgurated with a lot of electrode.  A few scattered oozing areas of the posterior wall were also fulgurated.  The tumor present in the bladder was not actively bleeding.  No bleeding from the bladder neck or prostatic urethra was noted.  With the bladder moderately full and irrigation at minimal flow no further oozing areas were noted.  The resectoscope was removed under direct vision.  A 24 French 3 -way Couvelaire catheter was then placed without difficulty and irrigated clear.  The effluent remained minimally pink-tinged on low-flow.  He was then transported to the PACU in stable condition.  Plan: CBI overnight If effluent clear to pink-tinged in a.m. on low-flow will DC CBI in a.m. and remove catheter early afternoon if no significant bleeding recurs   Abbie Sons, M.D.

## 2021-01-05 NOTE — Progress Notes (Signed)
Shel[place] RN spoke with Dr Andree Elk anesthesia, ok to d/c cardizem gtt at this time. Patient is in sinus rhythm with controlled rate.

## 2021-01-05 NOTE — Progress Notes (Addendum)
Patient had leakage around catheter around 0400. Manual irrigation attempted unsuccessfully. Minimal clots were irrigated but still leaking. Dr. Bernardo Heater called to see patient. Foley removed and replaced. Irrigated with approximately 1500cc by Dr. Bernardo Heater. CBI continued at moderate flow with pink-tinged effluent. Patient to have cystoscopy this morning. Report given to OR nurse and updated daughter, Magda Paganini about plan.

## 2021-01-05 NOTE — Progress Notes (Signed)
Urology  Called to see patient for clotted catheter which would not irrigate.  39 French Foley catheter was removed and a 24 [place] three-way Couvelaire catheter was placed without difficulty.  A moderate amount of clot was irrigated.  Placed back on CBI with pink-tinged effluent on moderate flow.  Plan: Was scheduled for cystoscopy with clot evacuation at 1145 this morning and recommend moving earlier this morning due to continued bleeding

## 2021-01-06 DIAGNOSIS — I4819 Other persistent atrial fibrillation: Secondary | ICD-10-CM | POA: Diagnosis not present

## 2021-01-06 DIAGNOSIS — D62 Acute posthemorrhagic anemia: Secondary | ICD-10-CM | POA: Diagnosis not present

## 2021-01-06 DIAGNOSIS — R310 Gross hematuria: Secondary | ICD-10-CM | POA: Diagnosis not present

## 2021-01-06 DIAGNOSIS — J9601 Acute respiratory failure with hypoxia: Secondary | ICD-10-CM | POA: Diagnosis not present

## 2021-01-06 LAB — PREPARE RBC (CROSSMATCH)

## 2021-01-06 LAB — CBC WITH DIFFERENTIAL/PLATELET
Abs Immature Granulocytes: 0.04 10*3/uL (ref 0.00–0.07)
Basophils Absolute: 0 10*3/uL (ref 0.0–0.1)
Basophils Relative: 0 %
Eosinophils Absolute: 0 10*3/uL (ref 0.0–0.5)
Eosinophils Relative: 0 %
HCT: 22.8 % — ABNORMAL LOW (ref 39.0–52.0)
Hemoglobin: 7.6 g/dL — ABNORMAL LOW (ref 13.0–17.0)
Immature Granulocytes: 1 %
Lymphocytes Relative: 6 %
Lymphs Abs: 0.4 10*3/uL — ABNORMAL LOW (ref 0.7–4.0)
MCH: 32.6 pg (ref 26.0–34.0)
MCHC: 33.3 g/dL (ref 30.0–36.0)
MCV: 97.9 fL (ref 80.0–100.0)
Monocytes Absolute: 1.1 10*3/uL — ABNORMAL HIGH (ref 0.1–1.0)
Monocytes Relative: 16 %
Neutro Abs: 5.2 10*3/uL (ref 1.7–7.7)
Neutrophils Relative %: 77 %
Platelets: 214 10*3/uL (ref 150–400)
RBC: 2.33 MIL/uL — ABNORMAL LOW (ref 4.22–5.81)
RDW: 14.7 % (ref 11.5–15.5)
WBC: 6.8 10*3/uL (ref 4.0–10.5)
nRBC: 2.5 % — ABNORMAL HIGH (ref 0.0–0.2)

## 2021-01-06 LAB — BASIC METABOLIC PANEL
Anion gap: 4 — ABNORMAL LOW (ref 5–15)
BUN: 43 mg/dL — ABNORMAL HIGH (ref 8–23)
CO2: 28 mmol/L (ref 22–32)
Calcium: 8 mg/dL — ABNORMAL LOW (ref 8.9–10.3)
Chloride: 104 mmol/L (ref 98–111)
Creatinine, Ser: 2 mg/dL — ABNORMAL HIGH (ref 0.61–1.24)
GFR, Estimated: 31 mL/min — ABNORMAL LOW (ref >60)
Glucose, Bld: 126 mg/dL — ABNORMAL HIGH (ref 70–99)
Potassium: 5.1 mmol/L (ref 3.5–5.1)
Sodium: 136 mmol/L (ref 135–145)

## 2021-01-06 LAB — HEMOGLOBIN AND HEMATOCRIT, BLOOD
HCT: 26.2 % — ABNORMAL LOW (ref 39.0–52.0)
Hemoglobin: 8.8 g/dL — ABNORMAL LOW (ref 13.0–17.0)

## 2021-01-06 LAB — MAGNESIUM: Magnesium: 2.2 mg/dL (ref 1.7–2.4)

## 2021-01-06 MED ORDER — DILTIAZEM HCL-DEXTROSE 125-5 MG/125ML-% IV SOLN (PREMIX)
5.0000 mg/h | INTRAVENOUS | Status: DC
  Administered 2021-01-06: 10 mg/h via INTRAVENOUS
  Administered 2021-01-06: 5 mg/h via INTRAVENOUS
  Administered 2021-01-06: 10 mg/h via INTRAVENOUS
  Administered 2021-01-07: 7.5 mg/h via INTRAVENOUS
  Filled 2021-01-06: qty 125

## 2021-01-06 MED ORDER — DIGOXIN 0.25 MG/ML IJ SOLN
0.2500 mg | Freq: Once | INTRAMUSCULAR | Status: AC
  Administered 2021-01-06: 0.25 mg via INTRAVENOUS
  Filled 2021-01-06: qty 2

## 2021-01-06 MED ORDER — SODIUM CHLORIDE 0.9% IV SOLUTION: Freq: Once | INTRAVENOUS | Status: AC

## 2021-01-06 NOTE — Consult Note (Signed)
Cardiology Consultation Note    Patient ID: Christopher [name], MRN: [id number], DOB/AGE: 1927/10/20 87 y.o. Admit date: 01/03/2021   Date of Consult: 01/06/2021 Primary Physician: [name], MD Primary Cardiologist: Dr. Ubaldo Glassing  Chief Complaint: weakness/syncope Reason for Consultation: afib Requesting MD: Dr. Roosevelt Locks  HPI: Christopher [name] is a 87 y.o. male with history of urethral carcinoma of the bladder, paroxysmal atrial fibrillation on chronic anticoagulation therapy, hypertension and hyperlipidemia who is status post cystoscopy with fulguration and cryosurgery on December 20, 2020.  He was discharged to home with a Foley that was removed 2 days later without any problems.  1 day prior to admission on January 03, 2021 he had hematuria and multiple clots per his bladder.  Had 2 syncopal episodes while upright.  He was brought to the emergency room where he was noted to be significantly hypotensive with gross hematuria.  Laboratories revealed creatinine of 1.07.  Hemoglobin was 8.9 down from 13.11-week prior.  Twelve-lead EKG showed sinus rhythm.  He was seen by urology and was taken off of Eliquis which he was on for his A. fib.  On 01/05/2021 underwent cystoscopy with clot evacuation and liberation.  He developed atrial fibrillation with rapid ventricular response and was placed on a diltiazem drip.  His hemodynamics remained stable.  He was somewhat confused.  Consultation called regarding A. fib with RVR.  Patient is currently on diltiazem drip.  He 1 dose of IV digoxin.  He is currently in atrial fibrillation with controlled ventricular response with ventricular rates between the mid 80s to low 100s.  He admits to some confusion but is alert and oriented to person and place.  Past Medical History:  Diagnosis Date   A-fib ([place])    Anemia    Arthritis    BACK   Bladder cancer (HCC)    BPH (benign prostatic hyperplasia)    Cancer (HCC)    skin cancer on face carcinoma   ED (erectile  dysfunction)    Rosanna Randy disease    HLD (hyperlipidemia)    Hypertension    Macular degeneration    Mollusca contagiosa    Osteopenia    Presence of permanent cardiac pacemaker    Sensorineural hearing loss    Squamous cell carcinoma of skin 08/25/2019   R cheek    Squamous cell carcinoma of skin 02/12/2020   L neck    Squamous cell carcinoma of skin 11/30/2020   Right elbow, EDC      Surgical History:  Past Surgical History:  Procedure Laterality Date   CATARACT EXTRACTION Bilateral    CYSTOSCOPY WITH BIOPSY N/A 01/20/2020   Procedure: CYSTOSCOPY WITH BIOPSY;  Surgeon: Abbie Sons, MD;  Location: [facility];  Service: Urology;  Laterality: N/A;   CYSTOSCOPY WITH FULGERATION N/A 01/20/2020   Procedure: CYSTOSCOPY WITH FULGERATION;  Surgeon: Abbie Sons, MD;  Location: [facility];  Service: Urology;  Laterality: N/A;   CYSTOSCOPY WITH FULGERATION N/A 04/19/2020   Procedure: [place] with clot evacuation;  Surgeon: Abbie Sons, MD;  Location: [facility];  Service: Urology;  Laterality: N/A;   CYSTOSCOPY WITH FULGERATION N/A 01/05/2021   Procedure: CYSTOSCOPY WITH FULGERATION;  Surgeon: Abbie Sons, MD;  Location: [facility];  Service: Urology;  Laterality: N/A;   HEMORRHOIDECTOMY WITH HEMORRHOID BANDING     KNEE ARTHROSCOPY Left    PACEMAKER INSERTION N/A 03/03/2015   Procedure: INSERTION DUAL LEAD PACEMAKER;  Surgeon: Isaias Cowman, MD;  Location: [facility];  Service: Cardiovascular;  Laterality: N/A;   PULSE GENERATOR IMPLANT N/A 10/18/2020   Procedure: UNILATERAL PULSE GENERATOR IMPLANT;  Surgeon: [name], MD;  Location: [facility];  Service: Neurosurgery;  Laterality: N/A;   SPINAL CORD STIMULATOR TRIAL N/A 10/11/2020   Procedure: PERCUTANEOUS SPINAL CORD STIMULATOR TRIAL;  Surgeon: [name], MD;  Location: [facility];  Service: Neurosurgery;  Laterality: N/A;   THORACIC LAMINECTOMY FOR SPINAL CORD STIMULATOR N/A 10/18/2020   Procedure:  PERCUTANEOUS LEAD PLACEMENT;  Surgeon: [name], MD;  Location: [facility];  Service: Neurosurgery;  Laterality: N/A;   TONSILLECTOMY     TRANSURETHRAL RESECTION OF BLADDER TUMOR N/A 10/07/2019   Procedure: TRANSURETHRAL RESECTION OF BLADDER TUMOR (TURBT);  Surgeon: Abbie Sons, MD;  Location: [facility];  Service: Urology;  Laterality: N/A;   TRANSURETHRAL RESECTION OF BLADDER TUMOR N/A 04/13/2020   Procedure: TRANSURETHRAL RESECTION OF BLADDER TUMOR (TURBT);  Surgeon: Abbie Sons, MD;  Location: [facility];  Service: Urology;  Laterality: N/A;     Home Meds: Prior to Admission medications   Medication Sig Start Date End Date Taking? Authorizing Provider  alfuzosin (UROXATRAL) 10 MG 24 hr tablet Take 1 tablet (10 mg total) by mouth daily with breakfast. 08/21/19  Yes Stoioff, Ronda Fairly, MD  apixaban (ELIQUIS) 5 MG TABS tablet Take 5 mg by mouth 2 (two) times daily.   Yes [provider]  atorvastatin (LIPITOR) 10 MG tablet Take 10 mg by mouth in the morning. 11/18/14  Yes [provider]  calcium carbonate (OSCAL) 1500 (600 Ca) MG TABS tablet Take 600 mg of elemental calcium by mouth in the morning.   Yes [provider]  Cholecalciferol (VITAMIN D3) 50 MCG (2000 UT) TABS Take 2,000 Units by mouth in the morning.   Yes [provider]  ferrous sulfate 325 (65 FE) MG tablet Take 325 mg by mouth in the morning and at bedtime.   Yes [provider]  finasteride (PROSCAR) 5 MG tablet TAKE 1 TABLET DAILY Patient taking differently: Take 5 mg by mouth daily. 12/02/20  Yes Stoioff, Ronda Fairly, MD  metoprolol succinate (TOPROL-XL) 25 MG 24 hr tablet Take 25 mg by mouth in the morning. (Take with 50mg  tablet to equal 75mg  total)   Yes [provider]  metoprolol succinate (TOPROL-XL) 50 MG 24 hr tablet Take 50 mg by mouth in the morning. (Take with 25mg  tablet to equal 75mg  total) 03/22/19  Yes [provider]  Multiple Vitamin (MULTIVITAMIN  WITH MINERALS) TABS tablet Take 1 tablet by mouth in the morning.   Yes [provider]  Multiple Vitamins-Minerals (PRESERVISION AREDS 2 PO) Take 1 tablet by mouth in the morning and at bedtime.   Yes [provider]  Omega-3 Fatty Acids (FISH OIL) 1000 MG CAPS Take 1,000 mg by mouth daily.   Yes [provider]  tamsulosin (FLOMAX) 0.4 MG CAPS capsule Take 0.4 mg by mouth daily.   Yes [provider]  traMADol (ULTRAM) 50 MG tablet Take 1 tablet (50 mg total) by mouth every 8 (eight) hours as needed for severe pain. Patient not taking: No sig reported 10/18/20 10/18/21  [name], MD    Inpatient Medications:   sodium chloride   Intravenous Once   alfuzosin  10 mg Oral Q breakfast   atorvastatin  10 mg Oral Daily   calcium carbonate  500 mg of elemental calcium Oral Q breakfast   Chlorhexidine Gluconate Cloth  6 each Topical Daily   cholecalciferol  2,000 Units Oral q AM   digoxin  0.25 mg Intravenous Once   ferrous sulfate  325 mg Oral Q breakfast   finasteride  5 mg Oral Daily   furosemide  40 mg Intravenous Once   multivitamin with minerals  1 tablet Oral q AM   tamsulosin  0.4 mg Oral Daily    diltiazem (CARDIZEM) infusion 10 mg/hr (01/06/21 0601)   sodium chloride irrigation 0 mL (01/03/21 1507)    Allergies:  Allergies  Allergen Reactions   Atenolol     Happened a long time ago and can't recall    Social History   Socioeconomic History   Marital status: Widowed    Spouse name: Not on file   Number of children: Not on file   Years of education: Not on file   Highest education level: Not on file  Occupational History   Not on file  Tobacco Use   Smoking status: Never   Smokeless tobacco: Never  Vaping Use   Vaping Use: Never used  Substance and Sexual Activity   Alcohol use: Yes    Alcohol/week: 7.0 standard drinks    Types: 7 Glasses of wine per week    Comment: DAILY    Drug use: No   Sexual activity: Yes    Birth  control/protection: None  Other Topics Concern   Not on file  Social History Narrative   Retd. Civil engineer, contracting; [place]; self. Never smoked; alcohol- glass wine/drink every night.     Social Determinants of Health   Financial Resource Strain: Not on file  Food Insecurity: Not on file  Transportation Needs: Not on file  Physical Activity: Not on file  Stress: Not on file  Social Connections: Not on file  Intimate Partner Violence: Not on file     Family History  Problem Relation Age of Onset   Diabetes Mother    Lung cancer Father    Cancer Paternal Uncle    Prostate cancer Neg Hx    Bladder Cancer Neg Hx    Kidney cancer Neg Hx      Review of Systems: A 12-system review of systems was performed and is negative except as noted in the HPI.  Labs: No results for input(s): CKTOTAL, CKMB, TROPONINI in the last 72 hours. Lab Results  Component Value Date   WBC 6.8 01/06/2021   HGB 7.6 (L) 01/06/2021   HCT 22.8 (L) 01/06/2021   MCV 97.9 01/06/2021   PLT 214 01/06/2021    Recent Labs  Lab 01/06/21 0601  NA 136  K 5.1  CL 104  CO2 28  BUN 43*  CREATININE 2.00*  CALCIUM 8.0*  GLUCOSE 126*   No results found for: CHOL, HDL, LDLCALC, TRIG No results found for: DDIMER  Radiology/Studies:  DG OR UROLOGY CYSTO IMAGE ([facility] ONLY)  Result Date: 01/05/2021 There is no interpretation for this exam.  This order is for images obtained during a surgical procedure.  Please See "Surgeries" Tab for more information regarding the procedure.    Wt Readings from Last 3 Encounters:  01/06/21 89.5 kg  12/28/20 88.5 kg  10/11/20 91.4 kg    EKG: Atrial fibrillation with controlled ventricular response  Physical Exam: Chronically ill-appearing male Blood pressure (!) 89/65, pulse (!) 143, temperature 97.9 F (36.6 C), resp. rate 17, height 5\' 11"  (1.803 m), weight 89.5 kg, SpO2 99 %. Body mass index is 27.52 kg/m. General: Well developed, well nourished, in no acute  distress. Head: Normocephalic, atraumatic, sclera  non-icteric, no xanthomas, nares are without discharge.  Neck: Negative for carotid bruits. JVD not elevated. Lungs: Clear bilaterally to auscultation without wheezes, rales, or rhonchi. Breathing is unlabored. Heart: Irregularly irregular Abdomen: Soft, non-tender, with mild suprapubic tenderness. Msk:  Strength and tone appear normal for age. Extremities: No clubbing or cyanosis.  2+ edema  Neuro: Alert and oriented X 3. No facial asymmetry. No focal deficit. Moves all extremities spontaneously. Psych:  Responds to questions appropriately with a normal affect.     Assessment and Plan  87 year old male with history of paroxysmal A. fib, history of bladder carcinoma who was admitted with hematuria and penile pain post cystoscopy.  Underwent urologic evaluation.  He was taken off of Eliquis.  Has a history of paroxysmal atrial fibrillation.  Was in sinus rhythm on admission but developed A. fib with RVR.  Currently with good rate control with IV diltiazem.  Blood pressure little soft but currently hemodynamically stable.  1.  A. fib-had A. fib redeveloped with RVR.  He is on Eliquis as an outpatient.  This was held due to his urologic procedures and hematuria.  Currently on a diltiazem drip.  We will continue to remain off Eliquis for now.  Will continue with Cardizem drip for the next couple hours following heart rate.  Consideration for converting to p.o. diltiazem.  He is on metoprolol succinate as an outpatient at low-dose.  2.  Bladder carcinoma-being followed by urology.  Appreciate their input.  Prior to discharge will need to rediscuss whether or not anticoagulation is appropriate.  3.  Confusion-appears fairly oriented this morning.  Will need to follow.  4.  Anemia-likely secondary to hematuria.  Will follow.  5.  Hypotension-secondary to bleeding most likely.  We will closely follow.  Ivin Booty MD 01/06/2021, 8:48  AM Pager: [phone]

## 2021-01-06 NOTE — Progress Notes (Signed)
PROGRESS NOTE    Christopher [name]  CWU:[id number] DOB: 1927/09/17 DOA: 01/03/2021 PCP: [name], MD   Chief complaint.  Syncope. Brief Narrative:  Christopher [name] is a 87 y.o. male with medical history significant for urothelial cancer of the bladder, history of atrial fibrillation on chronic anticoagulation therapy, hypertension, dyslipidemia who presents to the ER via EMS for evaluation of 2 witnessed syncopal episodes. Patient recently had cystoscopy with fulguration and cryosurgery for bladder cancer on 6/13.  He had a significant gross hematuria with a large amount of blood clots.  Evaluated by urology, started CBI. Cystoscopy and clot evacuation performed on 6/29. Developed rapid ventricular response with atrial fibrillation on 6/30.  Started on diltiazem drip.  Assessment & Plan:   Principal Problem:   Acute blood loss anemia Active Problems:   HTN (hypertension)   Benign prostatic hyperplasia with urinary frequency   Persistent atrial fibrillation (HCC)   Urothelial carcinoma of bladder ([place])   Gross hematuria   Acute renal failure (ARF) ([place])   Acute hypoxemic respiratory failure ([place])  #1.  Acute blood loss anemia. Hypovolemic shock, POA Iron deficient anemia. Gross hematuria secondary to bladder malignancy. Patient had cystoscopy and clot evacuation performed on 6/29.  Hemoglobin dropped again again to 7.6, will give another unit of PRBC.    2.  Paroxysmal fibrillation with rapid ventricle response. Transient hypotension. Patient had worsening tachycardia this morning, blood pressure is more stable.  He is started on diltiazem drip, I will give dose of digoxin IV.  Will not give a maintenance dose due to renal function. Discussed with Dr. Ubaldo Glassing, who will see the patient today. 10:40.  Heart rate has slowed down below 100 after given digoxin.  We will continue to follow.  #3.  Acute hypoxemic respiratory failure. Patient is still on 2 L oxygen, but no  longer feels short of breath.  We will just monitor for today.  #4.  Acute kidney injury. Hyponatremia Hyperkalemia. Renal function improving.  Potassium normalized.  CODE STATUS.  Discussed with the patient, called both daughters, clarified definition of resuscitation and intubation.  The daughter, Lattie Haw, [place], still want patient to be full code.  DVT prophylaxis: SCds Code Status: Full Family Communication: daughter updated Disposition Plan:    Status is: Inpatient  Remains inpatient appropriate because:Inpatient level of care appropriate due to severity of illness  Dispo: The patient is from: Home              Anticipated d/c is to: SNF              Patient currently is not medically stable to d/c.   Difficult to place patient No        I/O last 3 completed shifts: In: 38825.5 [I.V.:511.5; Blood:214; TUUEK:80034] Out: 91791 [TAVWP:79480] Total I/O In: -  Out: [place] [Urine:1050]     Consultants:  Urology, cardiology  Procedures: cystoscopy  Antimicrobials: none  Subjective: Patient was confused last night, agitated, try to pull out the IV.  He is better this morning. He developed rapid ventricle response from atrial fibrillation, started diltiazem drip. Currently he denies any short of breath or cough. He does not have any abdominal pain or nausea vomiting. No fever or chills.   Objective: Vitals:   01/06/21 0633 01/06/21 0700 01/06/21 0730 01/06/21 0739  BP: 107/73 92/63 102/62 (!) 89/65  Pulse: (!) 139 88 (!) 145 (!) 143  Resp:      Temp:      TempSrc:  SpO2:      Weight:      Height:        Intake/Output Summary (Last 24 hours) at 01/06/2021 0840 Last data filed at 01/06/2021 0747 Gross per 24 hour  Intake 19111.51 ml  Output 20590 ml  Net -1478.49 ml   Filed Weights   01/03/21 1253 01/03/21 2312 01/06/21 0429  Weight: 88.5 kg 90 kg 89.5 kg    Examination:  General exam: Appears calm and comfortable  Respiratory system: Clear to  auscultation. Respiratory effort normal. Cardiovascular system: Irregular and tachycardic.  No JVD, murmurs, rubs, gallops or clicks. No pedal edema. Gastrointestinal system: Abdomen is nondistended, soft and nontender. No organomegaly or masses felt. Normal bowel sounds heard. Central nervous system: Alert and oriented x2.  No focal neurological deficits. Extremities: Symmetric 5 x 5 power. Skin: No rashes, lesions or ulcers Psychiatry: Judgement and insight appear normal. Mood & affect appropriate.     Data Reviewed: I have personally reviewed following labs and imaging studies  CBC: Recent Labs  Lab 01/03/21 1257 01/03/21 1614 01/03/21 2321 01/04/21 1550 01/04/21 2202 01/05/21 0638 01/05/21 1745 01/06/21 0601  WBC 7.9 7.3  --   --   --   --   --  6.8  NEUTROABS  --   --   --   --   --   --   --  5.2  HGB 8.9* 7.9*   < > 7.8* 9.5* 9.1* 8.5* 7.6*  HCT 26.7* 23.6*   < > 23.3* 29.0* 27.0* 25.6* 22.8*  MCV 97.1 97.1  --   --   --   --   --  97.9  PLT 227 196  --   --   --   --   --  214   < > = values in this interval not displayed.   Basic Metabolic Panel: Recent Labs  Lab 01/03/21 1257 01/04/21 0548 01/04/21 1550 01/05/21 0638 01/06/21 0601  NA 132* 134*  --  135 136  K 4.1 5.7* 5.3* 5.1 5.1  CL 99 102  --  100 104  CO2 26 28  --  26 28  GLUCOSE 133* 145*  --  153* 126*  BUN 20 22  --  31* 43*  CREATININE 1.07 1.49*  --  2.42* 2.00*  CALCIUM 8.6* 8.4*  --  8.5* 8.0*  MG  --   --   --   --  2.2   GFR: Estimated Creatinine Clearance: 26.1 mL/min (A) (by C-G formula based on SCr of 2 mg/dL (H)). Liver Function Tests: No results for input(s): AST, ALT, ALKPHOS, BILITOT, PROT, ALBUMIN in the last 168 hours. No results for input(s): LIPASE, AMYLASE in the last 168 hours. No results for input(s): AMMONIA in the last 168 hours. Coagulation Profile: No results for input(s): INR, PROTIME in the last 168 hours. Cardiac Enzymes: No results for input(s): CKTOTAL, CKMB,  CKMBINDEX, TROPONINI in the last 168 hours. BNP (last 3 results) No results for input(s): PROBNP in the last 8760 hours. HbA1C: No results for input(s): HGBA1C in the last 72 hours. CBG: No results for input(s): GLUCAP in the last 168 hours. Lipid Profile: No results for input(s): CHOL, HDL, LDLCALC, TRIG, CHOLHDL, LDLDIRECT in the last 72 hours. Thyroid Function Tests: No results for input(s): TSH, T4TOTAL, FREET4, T3FREE, THYROIDAB in the last 72 hours. Anemia Panel: Recent Labs    01/04/21 0548 01/04/21 0809  VITAMINB12  --  627  TIBC 333  --  IRON 19*  --    Sepsis Labs: No results for input(s): PROCALCITON, LATICACIDVEN in the last 168 hours.  Recent Results (from the past 240 hour(s))  Resp Panel by RT-PCR (Flu A&B, Covid) Nasopharyngeal Swab     Status: None   Collection Time: 01/03/21  3:22 PM   Specimen: Nasopharyngeal Swab; Nasopharyngeal(NP) swabs in vial transport medium  Result Value Ref Range Status   SARS Coronavirus 2 by RT PCR NEGATIVE NEGATIVE Final    Comment: (NOTE) SARS-CoV-2 target nucleic acids are NOT DETECTED.  The SARS-CoV-2 RNA is generally detectable in upper respiratory specimens during the acute phase of infection. The lowest concentration of SARS-CoV-2 viral copies this assay can detect is 138 copies/mL. A negative result does not preclude SARS-Cov-2 infection and should not be used as the sole basis for treatment or other patient management decisions. A negative result may occur with  improper specimen collection/handling, submission of specimen other than nasopharyngeal swab, presence of viral mutation(s) within the areas targeted by this assay, and inadequate number of viral copies(<138 copies/mL). A negative result must be combined with clinical observations, patient history, and epidemiological information. The expected result is Negative.  Fact Sheet for Patients:  EntrepreneurPulse.com.au  Fact Sheet for  Healthcare Providers:  IncredibleEmployment.be  This test is no t yet approved or cleared by the [place] FDA and  has been authorized for detection and/or diagnosis of SARS-CoV-2 by FDA under an Emergency Use Authorization (EUA). This EUA will remain  in effect (meaning this test can be used) for the duration of the COVID-19 declaration under Section 564(b)(1) of the Act, 21 U.S.C.section 360bbb-3(b)(1), unless the authorization is terminated  or revoked sooner.       Influenza A by PCR NEGATIVE NEGATIVE Final   Influenza B by PCR NEGATIVE NEGATIVE Final    Comment: (NOTE) The Xpert Xpress SARS-CoV-2/FLU/RSV plus assay is intended as an aid in the diagnosis of influenza from Nasopharyngeal swab specimens and should not be used as a sole basis for treatment. Nasal washings and aspirates are unacceptable for Xpert Xpress SARS-CoV-2/FLU/RSV testing.  Fact Sheet for Patients: EntrepreneurPulse.com.au  Fact Sheet for Healthcare Providers: IncredibleEmployment.be  This test is not yet approved or cleared by the [place] FDA and has been authorized for detection and/or diagnosis of SARS-CoV-2 by FDA under an Emergency Use Authorization (EUA). This EUA will remain in effect (meaning this test can be used) for the duration of the COVID-19 declaration under Section 564(b)(1) of the Act, 21 U.S.C. section 360bbb-3(b)(1), unless the authorization is terminated or revoked.  Performed at [facility], [address] [place], [place] [address]          Radiology Studies: DG OR UROLOGY CYSTO IMAGE ([place])  Result Date: 01/05/2021 There is no interpretation for this exam.  This order is for images obtained during a surgical procedure.  Please See "Surgeries" Tab for more information regarding the procedure.        Scheduled Meds:  sodium chloride   Intravenous Once   alfuzosin  10 mg Oral Q breakfast    atorvastatin  10 mg Oral Daily   calcium carbonate  500 mg of elemental calcium Oral Q breakfast   Chlorhexidine Gluconate Cloth  6 each Topical Daily   cholecalciferol  2,000 Units Oral q AM   digoxin  0.25 mg Intravenous Once   ferrous sulfate  325 mg Oral Q breakfast   finasteride  5 mg Oral Daily   furosemide  40  mg Intravenous Once   multivitamin with minerals  1 tablet Oral q AM   tamsulosin  0.4 mg Oral Daily   Continuous Infusions:  diltiazem (CARDIZEM) infusion 10 mg/hr (01/06/21 0601)   sodium chloride irrigation 0 mL (01/03/21 1507)     LOS: 3 days    Time spent: 32 minutes    Sharen Hones, MD [facility]   To contact the attending provider between 7A-7P or the covering provider during after hours 7P-7A, please log into the web site [email or web address] and access using universal [place] password for that web site. If you do not have the password, please call the hospital operator.  01/06/2021, 8:40 AM

## 2021-01-06 NOTE — Progress Notes (Signed)
Patient voided 50 cc of pink tinged urine. Bladder scan performed 104 cc shown in bladder. Patient resting comfortably in bed. No reported concerns at this time. MD Stoioff made aware. No new orders at this time.

## 2021-01-06 NOTE — Anesthesia Postprocedure Evaluation (Signed)
Anesthesia Post Note  Patient: [name]  Procedure(s) Performed: [place]  Patient location during evaluation: PACU Anesthesia Type: General Level of consciousness: awake and alert Pain management: pain level controlled Vital Signs Assessment: post-procedure vital signs reviewed and stable Respiratory status: spontaneous breathing, nonlabored ventilation, respiratory function stable and patient connected to nasal cannula oxygen Cardiovascular status: blood pressure returned to baseline and stable Postop Assessment: no apparent nausea or vomiting Anesthetic complications: no   No notable events documented.   Last Vitals:  Vitals:   01/06/21 0830 01/06/21 0852  BP: (!) 113/57 101/62  Pulse: (!) 113 88  Resp:    Temp:    SpO2:      Last Pain:  Vitals:   01/06/21 0250  TempSrc:   PainSc: 0-No pain                 Molli Barrows

## 2021-01-06 NOTE — Progress Notes (Signed)
Pt is alert, not oriented to place or situation. VS stable, was sinus/a paced earlier in the shift, now afib RVR on dilt drip at 10/hr. BP good. Mittens in place. Managed to get them off himself earlier and ripped out his IV in his hand. Complained of pain multiple times, given dilaudid x2 and belladonna supp once. 3L [place]. CBI going with two bags to gravity. Foley in place. T&R tonight.

## 2021-01-06 NOTE — Progress Notes (Signed)
OT Cancellation Note  Patient Details Name: Christopher [name] MRN: [id number] DOB: October 16, 1927   Cancelled Treatment:    Reason Eval/Treat Not Completed: Medical issues which prohibited therapy. OT order received and chart reviewed. Patient started on diltiazem drip at 0523 today. Per therapy guidelines, pt to be held for 24 hours with new drip. OT will re-attempt when pt is medically able to actively participate in OT intervention.   Darleen Crocker, [place], OTR/L , CBIS ascom [phone]  01/06/21, 1:03 PM

## 2021-01-06 NOTE — Progress Notes (Signed)
Pts HR sustaining in 130s/ pt asymptomatic/ MD made aware/ per Dr . Ubaldo Glassing restart cardizem gtt/ gtt restarted/ will monitor.

## 2021-01-06 NOTE — Progress Notes (Signed)
Patient reassessed at the bedside this afternoon. Urine light pink on CBI, sediment present but no clots visualized. OK to discontinue Foley, order placed. Given his mental status, I have also ordered a follow-up bladder scan to be performed at least 4 hours after Foley removal after the patient urinates. Please notify urology of results.

## 2021-01-06 NOTE — Care Management Important Message (Signed)
Important Message  Patient Details  Name: NIRANJAN RUFENER MRN: [id number] Date of Birth: 1927/07/20   Medicare Important Message Given:  Yes     [name] 01/06/2021, 12:56 PM

## 2021-01-06 NOTE — Progress Notes (Signed)
Urology Inpatient Progress Note  Subjective: Mitts in place due to delirium/confusion. Creatinine down today, 2.00.  Hemoglobin and creatinine down today, 7.6 and 22.8, respectively. Foley catheter in place draining clear urine with a faint pink tinge with blood product sediment and no evident clots on slow drip CBI. He denies pain today.  Anti-infectives: Anti-infectives (From admission, onward)    None      Current Facility-Administered Medications  Medication Dose Route Frequency Provider Last Rate Last Admin   0.9 %  sodium chloride infusion (Manually program via Guardrails IV Fluids)   Intravenous Once Sharen Hones, MD       acetaminophen (TYLENOL) tablet 650 mg  650 mg Oral Q6H PRN Agbata, Tochukwu, MD       Or   acetaminophen (TYLENOL) suppository 650 mg  650 mg Rectal Q6H PRN Agbata, Tochukwu, MD       alfuzosin (UROXATRAL) 24 hr tablet 10 mg  10 mg Oral Q breakfast Agbata, Tochukwu, MD   10 mg at 01/06/21 0839   atorvastatin (LIPITOR) tablet 10 mg  10 mg Oral Daily Agbata, Tochukwu, MD   10 mg at 01/04/21 2016   belladonna-opium (B&O) suppository 16.2-60mg   1 suppository Rectal Q6H PRN Agbata, Tochukwu, MD   1 suppository at 01/06/21 0154   calcium carbonate (OS-CAL - dosed in mg of elemental calcium) tablet 500 mg of elemental calcium  500 mg of elemental calcium Oral Q breakfast Agbata, Tochukwu, MD   500 mg of elemental calcium at 01/06/21 7782   Chlorhexidine Gluconate Cloth 2 % PADS 6 each  6 each Topical Daily Agbata, Tochukwu, MD   6 each at 01/06/21 0840   cholecalciferol (VITAMIN D3) tablet 2,000 Units  2,000 Units Oral q AM Agbata, Tochukwu, MD   2,000 Units at 01/04/21 1041   digoxin (LANOXIN) 0.25 MG/ML injection 0.25 mg  0.25 mg Intravenous Once Sharen Hones, MD       diltiazem (CARDIZEM) 125 mg in dextrose 5% 125 mL (1 mg/mL) infusion  5-15 mg/hr Intravenous Titrated Mansy, Jan A, MD 10 mL/hr at 01/06/21 0601 10 mg/hr at 01/06/21 0601   ferrous sulfate tablet 325  mg  325 mg Oral Q breakfast Agbata, Tochukwu, MD   325 mg at 01/06/21 0839   finasteride (PROSCAR) tablet 5 mg  5 mg Oral Daily Agbata, Tochukwu, MD   5 mg at 01/06/21 4235   furosemide (LASIX) injection 40 mg  40 mg Intravenous Once Sharen Hones, MD       HYDROmorphone (DILAUDID) injection 0.5 mg  0.5 mg Intravenous Q2H PRN Sharen Hones, MD   0.5 mg at 01/05/21 2320   multivitamin with minerals tablet 1 tablet  1 tablet Oral q AM Agbata, Tochukwu, MD   1 tablet at 01/04/21 1042   ondansetron (ZOFRAN) tablet 4 mg  4 mg Oral Q6H PRN Agbata, Tochukwu, MD       Or   ondansetron (ZOFRAN) injection 4 mg  4 mg Intravenous Q6H PRN Agbata, Tochukwu, MD       sodium chloride irrigation 0.9 % 3,000 mL  3,000 mL Irrigation Continuous Naaman Plummer, MD 0 mL/hr at 01/03/21 1507 3,000 mL at 01/05/21 1523   tamsulosin (FLOMAX) capsule 0.4 mg  0.4 mg Oral Daily Agbata, Tochukwu, MD   0.4 mg at 01/06/21 0839   Objective: Vital signs in last 24 hours: Temp:  [97.4 F (36.3 C)-99.1 F (37.3 C)] 97.9 F (36.6 C) (06/30 0429) Pulse Rate:  [66-145] 143 (06/30 0739) Resp:  [  11-19] 17 (06/30 0429) BP: (89-132)/(40-82) 89/65 (06/30 0739) SpO2:  [92 %-100 %] 99 % (06/30 0429) Weight:  [89.5 kg] 89.5 kg (06/30 0429)  Intake/Output from previous day: 06/29 0701 - 06/30 0700 In: 19111.5 [I.V.:511.5] Out: 22040 [Urine:22040] Intake/Output this shift: Total I/O In: -  Out: 1050 [Urine:1050]  Physical Exam Vitals and nursing note reviewed.  Constitutional:      General: He is not in acute distress.    Appearance: He is not ill-appearing, toxic-appearing or diaphoretic.  HENT:     Head: Normocephalic and atraumatic.  Pulmonary:     Effort: Pulmonary effort is normal. No respiratory distress.  Skin:    General: Skin is warm and dry.  Neurological:     Mental Status: He is alert. He is disoriented.  Psychiatric:        Mood and Affect: Mood normal.        Behavior: Behavior normal.   Lab Results:   Recent Labs    01/03/21 1614 01/03/21 2321 01/05/21 1745 01/06/21 0601  WBC 7.3  --   --  6.8  HGB 7.9*   < > 8.5* 7.6*  HCT 23.6*   < > 25.6* 22.8*  PLT 196  --   --  214   < > = values in this interval not displayed.   BMET Recent Labs    01/05/21 0638 01/06/21 0601  NA 135 136  K 5.1 5.1  CL 100 104  CO2 26 28  GLUCOSE 153* 126*  BUN 31* 43*  CREATININE 2.42* 2.00*  CALCIUM 8.5* 8.0*   Assessment & Plan: 87 year old male with high-grade urothelial carcinoma of the bladder admitted with gross hematuria and clot retention, now POD 1 from cystoscopy with clot evacuation and fulguration with Dr. Bernardo Heater.  Urine is nearly clear on slow drip CBI.  I stopped CBI at the bedside this morning and we will continue to monitor.  If his urine remains light pink or lighter at midday, okay to discontinue CBI and Foley catheter.  H&H down today, will defer to primary team for transfusion as needed.  Debroah Loop, PA-C 01/06/2021

## 2021-01-07 ENCOUNTER — Inpatient Hospital Stay
Admit: 2021-01-07 | Discharge: 2021-01-07 | Disposition: A | Discharge status: Home / Self Care | Payer: Medicare HMO | Attending: Internal Medicine | Admitting: Internal Medicine

## 2021-01-07 DIAGNOSIS — C674 Malignant neoplasm of posterior wall of bladder: Secondary | ICD-10-CM | POA: Diagnosis not present

## 2021-01-07 DIAGNOSIS — J9601 Acute respiratory failure with hypoxia: Secondary | ICD-10-CM | POA: Diagnosis not present

## 2021-01-07 DIAGNOSIS — N3289 Other specified disorders of bladder: Secondary | ICD-10-CM | POA: Diagnosis not present

## 2021-01-07 DIAGNOSIS — N178 Other acute kidney failure: Secondary | ICD-10-CM

## 2021-01-07 DIAGNOSIS — D62 Acute posthemorrhagic anemia: Secondary | ICD-10-CM | POA: Diagnosis not present

## 2021-01-07 LAB — ECHOCARDIOGRAM COMPLETE
AR max vel: 1.66 cm2
AV Area VTI: 1.57 cm2
AV Area mean vel: 1.53 cm2
AV Mean grad: 11.7 mmHg
AV Peak grad: 21.6 mmHg
Ao pk vel: 2.32 m/s
Area-P 1/2: 3.54 cm2
Height: 71 in
S' Lateral: 2.03 cm
Weight: 3156.99 oz

## 2021-01-07 LAB — CBC WITH DIFFERENTIAL/PLATELET
Abs Immature Granulocytes: 0.07 10*3/uL (ref 0.00–0.07)
Basophils Absolute: 0 10*3/uL (ref 0.0–0.1)
Basophils Relative: 0 %
Eosinophils Absolute: 0.1 10*3/uL (ref 0.0–0.5)
Eosinophils Relative: 2 %
HCT: 23.3 % — ABNORMAL LOW (ref 39.0–52.0)
Hemoglobin: 7.9 g/dL — ABNORMAL LOW (ref 13.0–17.0)
Immature Granulocytes: 1 %
Lymphocytes Relative: 6 %
Lymphs Abs: 0.4 10*3/uL — ABNORMAL LOW (ref 0.7–4.0)
MCH: 32.2 pg (ref 26.0–34.0)
MCHC: 33.9 g/dL (ref 30.0–36.0)
MCV: 95.1 fL (ref 80.0–100.0)
Monocytes Absolute: 1.2 10*3/uL — ABNORMAL HIGH (ref 0.1–1.0)
Monocytes Relative: 20 %
Neutro Abs: 4.3 10*3/uL (ref 1.7–7.7)
Neutrophils Relative %: 71 %
Platelets: 193 10*3/uL (ref 150–400)
RBC: 2.45 MIL/uL — ABNORMAL LOW (ref 4.22–5.81)
RDW: 15 % (ref 11.5–15.5)
Smear Review: NORMAL
WBC: 6 10*3/uL (ref 4.0–10.5)
nRBC: 3.1 % — ABNORMAL HIGH (ref 0.0–0.2)

## 2021-01-07 LAB — TYPE AND SCREEN
ABO/RH(D): A POS
Antibody Screen: NEGATIVE
Unit division: 0
Unit division: 0

## 2021-01-07 LAB — BPAM RBC
Blood Product Expiration Date: 202207292359
Blood Product Expiration Date: 202208012359
ISSUE DATE / TIME: 202206281741
ISSUE DATE / TIME: 202206301124
Unit Type and Rh: 6200
Unit Type and Rh: 6200

## 2021-01-07 LAB — BASIC METABOLIC PANEL
Anion gap: 4 — ABNORMAL LOW (ref 5–15)
BUN: 39 mg/dL — ABNORMAL HIGH (ref 8–23)
CO2: 28 mmol/L (ref 22–32)
Calcium: 8.3 mg/dL — ABNORMAL LOW (ref 8.9–10.3)
Chloride: 104 mmol/L (ref 98–111)
Creatinine, Ser: 1.32 mg/dL — ABNORMAL HIGH (ref 0.61–1.24)
GFR, Estimated: 51 mL/min — ABNORMAL LOW (ref >60)
Glucose, Bld: 112 mg/dL — ABNORMAL HIGH (ref 70–99)
Potassium: 4.1 mmol/L (ref 3.5–5.1)
Sodium: 136 mmol/L (ref 135–145)

## 2021-01-07 LAB — MAGNESIUM: Magnesium: 2.4 mg/dL (ref 1.7–2.4)

## 2021-01-07 LAB — HEMOGLOBIN AND HEMATOCRIT, BLOOD
HCT: 25.6 % — ABNORMAL LOW (ref 39.0–52.0)
Hemoglobin: 8.4 g/dL — ABNORMAL LOW (ref 13.0–17.0)

## 2021-01-07 MED ORDER — FUROSEMIDE 10 MG/ML IJ SOLN
40.0000 mg | Freq: Once | INTRAMUSCULAR | Status: AC
  Administered 2021-01-07: 40 mg via INTRAVENOUS
  Filled 2021-01-07: qty 4

## 2021-01-07 MED ORDER — QUETIAPINE FUMARATE 25 MG PO TABS
25.0000 mg | ORAL_TABLET | Freq: Every day | ORAL | Status: DC
  Administered 2021-01-07: 25 mg via ORAL
  Filled 2021-01-07: qty 1

## 2021-01-07 MED ORDER — DILTIAZEM HCL 30 MG PO TABS
30.0000 mg | ORAL_TABLET | Freq: Four times a day (QID) | ORAL | Status: DC
  Administered 2021-01-07 – 2021-01-14 (×31): 30 mg via ORAL
  Filled 2021-01-07 (×29): qty 1

## 2021-01-07 NOTE — Progress Notes (Signed)
Urology Consult Follow Up  Subjective: Patient resting comfortably in bed.  He appears to be better orientated this morning as he states he had a rough night due to the hallucinations caused by a low blood count.  VSS afebrile.  Hemoglobin is 7.9 down from 8.8 yesterday.  Hematocrit 23.3 down from 26.2 yesterday.  Serum creatinine improving to 1.32 today from 2.00 yesterday.  Urine is clear yellow with a very mild pink hue in the pure wick tubing.  Anti-infectives: Anti-infectives (From admission, onward)    None       Current Facility-Administered Medications  Medication Dose Route Frequency Provider Last Rate Last Admin   acetaminophen (TYLENOL) tablet 650 mg  650 mg Oral Q6H PRN Agbata, Tochukwu, MD       Or   acetaminophen (TYLENOL) suppository 650 mg  650 mg Rectal Q6H PRN Agbata, Tochukwu, MD       alfuzosin (UROXATRAL) 24 hr tablet 10 mg  10 mg Oral Q breakfast Agbata, Tochukwu, MD   10 mg at 01/06/21 0938   atorvastatin (LIPITOR) tablet 10 mg  10 mg Oral Daily Agbata, Tochukwu, MD   10 mg at 01/06/21 2154   belladonna-opium (B&O) suppository 16.2-60mg   1 suppository Rectal Q6H PRN Agbata, Tochukwu, MD   1 suppository at 01/06/21 0154   calcium carbonate (OS-CAL - dosed in mg of elemental calcium) tablet 500 mg of elemental calcium  500 mg of elemental calcium Oral Q breakfast Agbata, Tochukwu, MD   500 mg of elemental calcium at 01/06/21 1829   Chlorhexidine Gluconate Cloth 2 % PADS 6 each  6 each Topical Daily Agbata, Tochukwu, MD   6 each at 01/06/21 0840   cholecalciferol (VITAMIN D3) tablet 2,000 Units  2,000 Units Oral q AM Agbata, Tochukwu, MD   2,000 Units at 01/06/21 0900   diltiazem (CARDIZEM) 125 mg in dextrose 5% 125 mL (1 mg/mL) infusion  5-15 mg/hr Intravenous Titrated Mansy, Jan A, MD 5 mL/hr at 01/07/21 9371 5 mg/hr at 01/07/21 6967   ferrous sulfate tablet 325 mg  325 mg Oral Q breakfast Agbata, Tochukwu, MD   325 mg at 01/06/21 0839   finasteride (PROSCAR)  tablet 5 mg  5 mg Oral Daily Agbata, Tochukwu, MD   5 mg at 01/06/21 8938   HYDROmorphone (DILAUDID) injection 0.5 mg  0.5 mg Intravenous Q2H PRN Sharen Hones, MD   0.5 mg at 01/05/21 2320   multivitamin with minerals tablet 1 tablet  1 tablet Oral q AM Agbata, Tochukwu, MD   1 tablet at 01/06/21 0900   ondansetron (ZOFRAN) tablet 4 mg  4 mg Oral Q6H PRN Agbata, Tochukwu, MD       Or   ondansetron (ZOFRAN) injection 4 mg  4 mg Intravenous Q6H PRN Agbata, Tochukwu, MD       tamsulosin (FLOMAX) capsule 0.4 mg  0.4 mg Oral Daily Agbata, Tochukwu, MD   0.4 mg at 01/06/21 0839     Objective: Vital signs in last 24 hours: Temp:  [97.9 F (36.6 C)-98.3 F (36.8 C)] 97.9 F (36.6 C) (07/01 0726) Pulse Rate:  [59-150] 59 (07/01 0726) Resp:  [16-20] 17 (07/01 0459) BP: (83-125)/(40-105) 122/44 (07/01 0726) SpO2:  [92 %-100 %] 100 % (07/01 0726) FiO2 (%):  [2 %] 2 % (07/01 0459)  Intake/Output from previous day: 06/30 0701 - 07/01 0700 In: 4254.6 [P.O.:480; I.V.:384.6; Blood:390] Out: 2400 [Urine:2400] Intake/Output this shift: No intake/output data recorded.   Physical Exam Constitutional:  Well nourished. Alert and  oriented, No acute distress. HEENT: [place] AT, moist mucus membranes.  Trachea midline Cardiovascular: No clubbing, cyanosis, or edema. Respiratory: Normal respiratory effort, no increased work of breathing. GU: No CVA tenderness.  No bladder fullness or masses.   Neurologic: Grossly intact, no focal deficits, moving all 4 extremities. Psychiatric: Normal mood and affect.   Lab Results:  Recent Labs    01/06/21 0601 01/06/21 1724 01/07/21 0607  WBC 6.8  --  6.0  HGB 7.6* 8.8* 7.9*  HCT 22.8* 26.2* 23.3*  PLT 214  --  193   BMET Recent Labs    01/06/21 0601 01/07/21 0607  NA 136 136  K 5.1 4.1  CL 104 104  CO2 28 28  GLUCOSE 126* 112*  BUN 43* 39*  CREATININE 2.00* 1.32*  CALCIUM 8.0* 8.3*   PT/INR No results for input(s): LABPROT, INR in the last 72  hours. ABG No results for input(s): PHART, HCO3 in the last 72 hours.  Invalid input(s): PCO2, PO2  Studies/Results: No results found.   Assessment and Plan: 87 year old male with high-grade urothelial carcinoma of the bladder admitted with gross hematuria and clot retention, now POD 2 from cystoscopy with clot evacuation and fulguration with Dr. Bernardo Heater.   Foley is out and urine is clear.  H&H down today, will defer to primary team for transfusion as needed.     LOS: 4 days    [facility] 01/07/2021

## 2021-01-07 NOTE — Progress Notes (Signed)
*  PRELIMINARY RESULTS* Echocardiogram 2D Echocardiogram has been performed.  Sherrie Sport 01/07/2021, 2:11 PM

## 2021-01-07 NOTE — Evaluation (Signed)
Occupational Therapy Evaluation Patient Details Name: Christopher [name] MRN: [id number] DOB: 21-May-1928 Today's Date: 01/07/2021    History of Present Illness Pt is a 87 yo male s/p cystoscopy with fulguration and cryotherapy 6/13/022, experienced two syncopal episodes and hematuria, after discharging home, returned to [place]. 01/05/2021 underwent cystoscopy with clot evacuation and liberation.  He developed atrial fibrillation with rapid ventricular response and was placed on a diltiazem drip.  PMH of afib, bladder cancer, HLD, HTN, cardiac pacemaker, spinal cord stimulator   Clinical Impression   Patient presenting with decreased I in self care, balance, functional mobility/transfers, endurance, and safety awareness. Patient reports being independent and living with grandson PTA. Pt reports being very active and caring for himself at baseline. He endorses loving yard work and prides himself on his yard looking nice. Pt with some increased confusion but pt very pleasant and agreeable. Pt with decreased coordination and motor planning for functional tasks this session with increased time to sequence and initiate. Pt stands from recliner chair with mod lifting assistance and stands for ~ 30 seconds with mod A and significant posterior bias. Pt fatigues quickly and returns to seated position. Pt is agreeable to sit in recliner chair for lunch.  Patient will benefit from acute OT to increase overall independence in the areas of ADLs, functional mobility, and safety awareness in order to safely discharge to next venue of care.    Follow Up Recommendations  SNF;Supervision/Assistance - 24 hour    Equipment Recommendations  Other (comment) (defer to next venue of care)       Precautions / Restrictions Precautions Precautions: Fall Restrictions Weight Bearing Restrictions: No      Mobility Bed Mobility Overal bed mobility: Needs Assistance Bed Mobility: Supine to Sit     Supine to sit: HOB  elevated;Min guard;Min assist     General bed mobility comments: seated in recliner chair upon entering the room    Transfers Overall transfer level: Needs assistance Equipment used: 1 person hand held assist Transfers: Sit to/from Stand Sit to Stand: Mod assist Stand pivot transfers: Min assist       General transfer comment: Pt needing mod lifting assistance to stand from recliner chair    Balance Overall balance assessment: Needs assistance Sitting-balance support: Feet supported Sitting balance-Leahy Scale: Fair   Postural control: Posterior lean   Standing balance-Leahy Scale: Poor Standing balance comment: reliant on RW                           ADL either performed or assessed with clinical judgement   ADL Overall ADL's : Needs assistance/impaired     Grooming: Wash/dry hands;Wash/dry face;Sitting;Set up               Lower Body Dressing: Minimal assistance;Moderate assistance                       Vision Patient Visual Report: No change from baseline              Pertinent Vitals/Pain Pain Assessment: No/denies pain     Hand Dominance Right   Extremity/Trunk Assessment Upper Extremity Assessment Upper Extremity Assessment: Generalized weakness   Lower Extremity Assessment Lower Extremity Assessment: Generalized weakness   Cervical / Trunk Assessment Cervical / Trunk Assessment: Normal   Communication Communication Communication: No difficulties   Cognition Arousal/Alertness: Awake/alert Behavior During Therapy: WFL for tasks assessed/performed Overall Cognitive Status: No family/caregiver present to determine baseline  cognitive functioning                                 General Comments: Pt O & O x4 but does endorse feeling confusion with some tasks this session.   General Comments       Exercises Other Exercises Other Exercises: Pt attempted pericare in standing, minA for balance assist.  Ultimately maxA to complete pericare   Shoulder Instructions      Home Living Family/patient expects to be discharged to:: Private residence Living Arrangements: Other relatives (grandson) Available Help at Discharge: Family Type of Home: House Home Access: Stairs to enter Technical brewer of Steps: 1 Entrance Stairs-Rails: None Home Layout: One level     Bathroom Shower/Tub: Tub/shower unit         Home Equipment: Environmental consultant - 2 wheels;Other (comment)   Additional Comments: Pt has a shoe horn he uses daily      Prior Functioning/Environment Level of Independence: Independent                 OT Problem List: Decreased strength;Decreased activity tolerance;Impaired balance (sitting and/or standing);Decreased safety awareness;Cardiopulmonary status limiting activity;Decreased knowledge of use of DME or AE;Decreased cognition      OT Treatment/Interventions: Self-care/ADL training;Therapeutic exercise;Energy conservation;DME and/or AE instruction;Manual therapy;Modalities;Patient/family education;Balance training;Therapeutic activities    OT Goals(Current goals can be found in the care plan section) Acute Rehab OT Goals Patient Stated Goal: to get stronger OT Goal Formulation: With patient Time For Goal Achievement: 01/21/21 Potential to Achieve Goals: Good ADL Goals Pt Will Perform Grooming: with supervision;standing Pt Will Perform Lower Body Dressing: with supervision;sit to/from stand Pt Will Transfer to Toilet: with supervision;ambulating Pt Will Perform Toileting - Clothing Manipulation and hygiene: with supervision;sit to/from stand  OT Frequency: Min 2X/week   Barriers to D/C:    none known at this time          AM-PAC OT "6 Clicks" Daily Activity     Outcome Measure Help from another person eating meals?: None Help from another person taking care of personal grooming?: A Little Help from another person toileting, which includes using toliet,  bedpan, or urinal?: A Lot Help from another person bathing (including washing, rinsing, drying)?: A Lot Help from another person to put on and taking off regular upper body clothing?: A Little Help from another person to put on and taking off regular lower body clothing?: A Lot 6 Click Score: 16   End of Session Equipment Utilized During Treatment: Oxygen (3L O2) Nurse Communication: Mobility status  Activity Tolerance: Patient tolerated treatment well Patient left: in bed;with bed alarm set;with call bell/phone within reach;with family/visitor present  OT Visit Diagnosis: Unsteadiness on feet (R26.81);Repeated falls (R29.6);Muscle weakness (generalized) (M62.81)                Time: 2683-4196 OT Time Calculation (min): 20 min Charges:  OT General Charges $OT Visit: 1 Visit OT Evaluation $OT Eval Moderate Complexity: 1 Mod OT Treatments $Therapeutic Activity: 8-22 mins  Darleen Crocker, MS, OTR/L , CBIS ascom [phone]  01/07/21, 3:13 PM

## 2021-01-07 NOTE — Evaluation (Signed)
Physical Therapy Evaluation Patient Details Name: Christopher [name] MRN: [id number] DOB: 08-30-1927 Today's Date: 01/07/2021   History of Present Illness  Pt is a 87 yo male s/p cystoscopy with fulguration and cryotherapy 6/13/022, experienced two syncopal episodes and hematuria, after discharging home, returned to [place]. 01/05/2021 underwent cystoscopy with clot evacuation and liberation.  He developed atrial fibrillation with rapid ventricular response and was placed on a diltiazem drip.  PMH of afib, bladder cancer, HLD, HTN, cardiac pacemaker, spinal cord stimulator   Clinical Impression  Patient alert, oriented to self, situation, time, but did display some occasional confusion during tasks, motor planning with RW. At baseline pt reported he is independent, lives with his grandson.   The patient demonstrated bed mobility with occasional light minA, mostly CGA with use of bed rails. Effortful for patient. He was able to sit and take his medicine with RN with supervision. Pt transferred several times from varying surfaces, min-modA due to posterior lean, and assist for RW. Pericare performed from [facility] ultimately with maxA.  Overall the patient demonstrated deficits (see "PT Problem List") that impede the patient's functional abilities, safety, and mobility and would benefit from skilled PT intervention. Recommendation is SNF due to current level of assistance needed and change from PLOF.     Follow Up Recommendations SNF;Supervision for mobility/OOB    Equipment Recommendations  3in1 (PT)    Recommendations for Other Services       Precautions / Restrictions Precautions Precautions: Fall Restrictions Weight Bearing Restrictions: No      Mobility  Bed Mobility Overal bed mobility: Needs Assistance Bed Mobility: Supine to Sit     Supine to sit: HOB elevated;Min guard;Min assist     General bed mobility comments: very effortful for patient, occasional minA for posterior lean     Transfers Overall transfer level: Needs assistance Equipment used: Rolling walker (2 wheeled);None Transfers: Sit to/from [employer] to Stand: Min assist;Mod assist Stand pivot transfers: Min assist       General transfer comment: able to stand pivot to [facility] with use of commode rails and minA. From [facility] and other surfaces, RW necessary, modA-minA needed due to posterior lean  Ambulation/Gait Ambulation/Gait assistance: Min assist Gait Distance (Feet): 5 Feet Assistive device: Rolling walker (2 wheeled)       General Gait Details: posterior lean, shuffled step, cues for sequencing, assist for RW  Stairs            Wheelchair Mobility    Modified Rankin (Stroke Patients Only)       Balance Overall balance assessment: Needs assistance Sitting-balance support: Feet supported Sitting balance-Leahy Scale: Fair       Standing balance-Leahy Scale: Poor Standing balance comment: reliant on RW and PT assist                             Pertinent Vitals/Pain Pain Assessment: No/denies pain    Home Living Family/patient expects to be discharged to:: Private residence Living Arrangements: Other relatives (grandson) Available Help at Discharge: Family Type of Home: House Home Access: Stairs to enter Entrance Stairs-Rails: None Entrance Stairs-Number of Steps: 1 Home Layout: One level Home Equipment: Environmental consultant - 2 wheels Additional Comments: denied any other falls in the last 6 months    Prior Function Level of Independence: Independent               Hand Dominance   Dominant Hand: Right  Extremity/Trunk Assessment   Upper Extremity Assessment Upper Extremity Assessment: Overall WFL for tasks assessed    Lower Extremity Assessment Lower Extremity Assessment: Generalized weakness    Cervical / Trunk Assessment Cervical / Trunk Assessment: Normal  Communication   Communication: No difficulties  Cognition  Arousal/Alertness: Awake/alert Behavior During Therapy: WFL for tasks assessed/performed Overall Cognitive Status: No family/caregiver present to determine baseline cognitive functioning                                 General Comments: pt oriented to self, place, situation, but did exhibit some confusion with tasks during mobility      General Comments      Exercises Other Exercises Other Exercises: Pt attempted pericare in standing, minA for balance assist. Ultimately maxA to complete pericare   Assessment/Plan    PT Assessment Patient needs continued PT services  PT Problem List Decreased strength;Decreased mobility;Decreased safety awareness;Decreased activity tolerance;Decreased balance;Decreased knowledge of use of DME       PT Treatment Interventions Therapeutic exercise;DME instruction;Gait training;Balance training;Stair training;Neuromuscular re-education;Functional mobility training;Patient/family education;Therapeutic activities    PT Goals (Current goals can be found in the Care Plan section)  Acute Rehab PT Goals Patient Stated Goal: to get stronger PT Goal Formulation: With patient Time For Goal Achievement: 01/21/21 Potential to Achieve Goals: Good    Frequency Min 2X/week   Barriers to discharge Decreased caregiver support      Co-evaluation               AM-PAC PT "6 Clicks" Mobility  Outcome Measure Help needed turning from your back to your side while in a flat bed without using bedrails?: A Little Help needed moving from lying on your back to sitting on the side of a flat bed without using bedrails?: A Little Help needed moving to and from a bed to a chair (including a wheelchair)?: A Little Help needed standing up from a chair using your arms (e.g., wheelchair or bedside chair)?: A Little Help needed to walk in hospital room?: A Lot Help needed climbing 3-5 steps with a railing? : A Lot 6 Click Score: 16    End of Session  Equipment Utilized During Treatment: Gait belt Activity Tolerance: Patient tolerated treatment well Patient left: in chair;with chair alarm set;with call bell/phone within reach Nurse Communication: Mobility status PT Visit Diagnosis: Other abnormalities of gait and mobility (R26.89);Difficulty in walking, not elsewhere classified (R26.2);Muscle weakness (generalized) (M62.81)    Time: 6962-9528 PT Time Calculation (min) (ACUTE ONLY): 39 min   Charges:   PT Evaluation $PT Eval Moderate Complexity: 1 Mod PT Treatments $Therapeutic Activity: 23-37 mins      Lieutenant Diego PT, DPT 10:12 AM,01/07/21

## 2021-01-07 NOTE — Progress Notes (Addendum)
Patient awoken confused and attempting to get out of bed. Patient pulling at all lines including his heart monitor, PIVs, and oxygen tubing. Patient pulled off external catheter and soiled bed. Patient reoriented, cleaned up and provided with new linen. Telemetry monitor and oxygen tubing placed back on patient. New external catheter applied. Bed in lowest position. Bed alarm on. Call bell within reach. Room door left open.    Update: Patient had another episode of disorientation after awakening. Patient pulled out both IVs, removed his telemetry monitor and external catheter. Patient repeatedly asking "what is going on". RN attempted to reorient patient to which patient replied "if you say so". Patient having difficulty remembering his location but does remember that he is receiving medical care. When asked about his location hr replied " I can tell that I am in a medical facility", this RN began to recall events that happened while he was here to which patient replied "I remember that now". Cardizem gtt paused while this RN reinserted PIV's. New PIV's inserted and wrapped. Cardizem gtt restarted. Telemetry monitor, oxygen tubing, and new external catheter placed back on patient. Patient had episode of incontinence therefore patient cleaned up and linen changed. Mitts placed to deter patient from removing lines and equipment; two finger clearance under mitts. Patient now resting in bed comfortably with eyes closed. Bed alarm on and bed in lowest position. Call bell within reach.   Update: Patient more oriented this AM. Patient oriented to person, place and some of situation. Patient resting in bed. Mitts removed. No reported concerns at this time. Call bell with reach. Bed in lowest position with alarm on.

## 2021-01-07 NOTE — Progress Notes (Signed)
   01/04/21 0300  Assess: MEWS Score  Temp (!) 96 F (35.6 C)  BP (!) 129/54  Pulse Rate 88  ECG Heart Rate 87  Resp 13  Assess: MEWS Score  MEWS Temp 1  MEWS Systolic 0  MEWS Pulse 0  MEWS RR 1  MEWS LOC 0  MEWS Score 2  MEWS Score Color Yellow  Assess: if the MEWS score is Yellow or Red  Were vital signs taken at a resting state? No  Focused Assessment Change from prior assessment (see assessment flowsheet)  Does the patient meet 2 or more of the SIRS criteria? No  MEWS guidelines implemented *See Row Information* Yes  Treat  MEWS Interventions Escalated (See documentation below)  Take Vital Signs  Increase Vital Sign Frequency  Yellow: Q 2hr X 2 then Q 4hr X 2, if remains yellow, continue Q 4hrs  Escalate  MEWS: Escalate Yellow: discuss with charge nurse/RN and consider discussing with provider and RRT  Notify: Charge Nurse/RN  Name of Charge Nurse/RN Notified Melissa, RN  Date Charge Nurse/RN Notified 01/04/21  Time Charge Nurse/RN Notified 0245  Notify: Provider  Provider Name/Title Mansy  Date Provider Notified 01/04/21  Time Provider Notified 0245  Notification Type Page  Notification Reason Change in status  Provider response En route  Date of Provider Response 01/04/21  Time of Provider Response 0300  Notify: Rapid Response  Name of Rapid Response RN Notified Mariah, RN  Date Rapid Response Notified 01/04/21  Time Rapid Response Notified 0245  Document  Patient Outcome Stabilized after interventions  Progress note created (see row info) Yes  Assess: SIRS CRITERIA  SIRS Temperature  1  SIRS Pulse 0  SIRS Respirations  0  SIRS WBC 0  SIRS Score Sum  1  Inserted for Alea Millirons RN

## 2021-01-07 NOTE — Progress Notes (Addendum)
PROGRESS NOTE    Christopher [name]  XBJ:[id number] DOB: 1927-11-02 DOA: 01/03/2021 PCP: [name], MD   Chief complaint.  Syncope. Brief Narrative:  Christopher [name] is a 87 y.o. male with medical history significant for urothelial cancer of the bladder, history of atrial fibrillation on chronic anticoagulation therapy, hypertension, dyslipidemia who presents to the ER via EMS for evaluation of 2 witnessed syncopal episodes. Patient recently had cystoscopy with fulguration and cryosurgery for bladder cancer on 6/13.  He had a significant gross hematuria with a large amount of blood clots.  Evaluated by urology, started CBI. Cystoscopy and clot evacuation performed on 6/29. Developed rapid ventricular response with atrial fibrillation on 6/30.  Started on diltiazem drip.   Assessment & Plan:   Principal Problem:   Acute blood loss anemia Active Problems:   HTN (hypertension)   Benign prostatic hyperplasia with urinary frequency   Persistent atrial fibrillation (HCC)   Urothelial carcinoma of bladder ([place])   Gross hematuria   Acute renal failure (ARF) ([place])   Acute hypoxemic respiratory failure ([place])  #1.  Acute blood loss anemia. Hypovolemic shock, POA Iron deficient anemia. Gross hematuria secondary to bladder malignancy. Status post clot evacuation on 6/29. Hemoglobin today 7.9, patient urine looks clear, no additional bleeding.  Patient has a mild volume overload, will give 40 mg IV Lasix.  We will follow hemoglobin closely.  Hold off transfusion for now.  2.  Paroxysmal fibrillation with rapid ventricle response. Transient hypotension. Appreciate cardiology consult, heart rate much better today.  Diltiazem IV infusion discontinued.  3.  Acute hypoxemic respiratory failure. Likely acute on chronic diastolic congestive heart failure. Patient had echocardiogram in 2016, ejection fraction at time was normal.  Repeat echocardiogram.  Patient still on 2 L oxygen,  crackles in the base, will give 40 mg IV Lasix.  4.  Acute kidney injury secondary to obstruction. Hyponatremia. Hyperkalemia. Renal function greatly improved.  Continue to follow.  Addendum: 3086 Patient is seen by OT, feels that patient has significant increased weakness. This is most likely due to patient multiple medical conditions. But will obtain CT head to r/o stroke. Will consider MRI if needed.   DVT prophylaxis: SCDs Code Status: Full Family Communication: Daughter updated.  Disposition Plan:    Status is: Inpatient  Remains inpatient appropriate because:Inpatient level of care appropriate due to severity of illness  Dispo: The patient is from: Home              Anticipated d/c is to: SNF              Patient currently is not medically stable to d/c.   Difficult to place patient No        I/O last 3 completed shifts: In: 19254.6 [P.O.:480; I.V.:384.6; Blood:390; Other:18000] Out: 14240 [Urine:14240] No intake/output data recorded.     Consultants:  Urology, cardiology  Procedures: Cystoscopy.  Antimicrobials: None  Subjective: Patient feels much better today, no gross hematuria from Foley catheter. Does not have any short of breath, however still on 2 L oxygen. No abdominal pain or nausea vomiting. No dysuria hematuria, Foley still in place. No chest pain palpitation. No headache or dizziness.  Objective: Vitals:   01/07/21 0000 01/07/21 0252 01/07/21 0459 01/07/21 0726  BP: (!) 115/46 (!) 119/47 (!) 123/50 (!) 122/44  Pulse: 69 61 66 (!) 59  Resp:  17 17   Temp:  98.2 F (36.8 C) 98.1 F (36.7 C) 97.9 F (36.6 C)  TempSrc:  Oral Oral Oral  SpO2:  97% 97% 100%  Weight:      Height:        Intake/Output Summary (Last 24 hours) at 01/07/2021 1013 Last data filed at 01/07/2021 0500 Gross per 24 hour  Intake 1134.62 ml  Output 1350 ml  Net -215.38 ml   Filed Weights   01/03/21 1253 01/03/21 2312 01/06/21 0429  Weight: 88.5 kg 90 kg 89.5  kg    Examination:  General exam: Appears calm and comfortable  Respiratory system: Significant crackles in the base. Respiratory effort normal. Cardiovascular system: Irregular.  No JVD, murmurs, rubs, gallops or clicks. No pedal edema. Gastrointestinal system: Abdomen is nondistended, soft and nontender. No organomegaly or masses felt. Normal bowel sounds heard. Central nervous system: Alert and oriented x2. No focal neurological deficits. Extremities: Symmetric 5 x 5 power. Skin: No rashes, lesions or ulcers Psychiatry: Mood & affect appropriate.     Data Reviewed: I have personally reviewed following labs and imaging studies  CBC: Recent Labs  Lab 01/03/21 1257 01/03/21 1614 01/03/21 2321 01/05/21 3614 01/05/21 1745 01/06/21 0601 01/06/21 1724 01/07/21 0607  WBC 7.9 7.3  --   --   --  6.8  --  6.0  NEUTROABS  --   --   --   --   --  5.2  --  4.3  HGB 8.9* 7.9*   < > 9.1* 8.5* 7.6* 8.8* 7.9*  HCT 26.7* 23.6*   < > 27.0* 25.6* 22.8* 26.2* 23.3*  MCV 97.1 97.1  --   --   --  97.9  --  95.1  PLT 227 196  --   --   --  214  --  193   < > = values in this interval not displayed.   Basic Metabolic Panel: Recent Labs  Lab 01/03/21 1257 01/04/21 0548 01/04/21 1550 01/05/21 0638 01/06/21 0601 01/07/21 0607  NA 132* 134*  --  135 136 136  K 4.1 5.7* 5.3* 5.1 5.1 4.1  CL 99 102  --  100 104 104  CO2 26 28  --  26 28 28   GLUCOSE 133* 145*  --  153* 126* 112*  BUN 20 22  --  31* 43* 39*  CREATININE 1.07 1.49*  --  2.42* 2.00* 1.32*  CALCIUM 8.6* 8.4*  --  8.5* 8.0* 8.3*  MG  --   --   --   --  2.2 2.4   GFR: Estimated Creatinine Clearance: 39.6 mL/min (A) (by C-G formula based on SCr of 1.32 mg/dL (H)). Liver Function Tests: No results for input(s): AST, ALT, ALKPHOS, BILITOT, PROT, ALBUMIN in the last 168 hours. No results for input(s): LIPASE, AMYLASE in the last 168 hours. No results for input(s): AMMONIA in the last 168 hours. Coagulation Profile: No results  for input(s): INR, PROTIME in the last 168 hours. Cardiac Enzymes: No results for input(s): CKTOTAL, CKMB, CKMBINDEX, TROPONINI in the last 168 hours. BNP (last 3 results) No results for input(s): PROBNP in the last 8760 hours. HbA1C: No results for input(s): HGBA1C in the last 72 hours. CBG: No results for input(s): GLUCAP in the last 168 hours. Lipid Profile: No results for input(s): CHOL, HDL, LDLCALC, TRIG, CHOLHDL, LDLDIRECT in the last 72 hours. Thyroid Function Tests: No results for input(s): TSH, T4TOTAL, FREET4, T3FREE, THYROIDAB in the last 72 hours. Anemia Panel: No results for input(s): VITAMINB12, FOLATE, FERRITIN, TIBC, IRON, RETICCTPCT in the last 72 hours. Sepsis Labs: No results for  input(s): PROCALCITON, LATICACIDVEN in the last 168 hours.  Recent Results (from the past 240 hour(s))  Resp Panel by RT-PCR (Flu A&B, Covid) Nasopharyngeal Swab     Status: None   Collection Time: 01/03/21  3:22 PM   Specimen: Nasopharyngeal Swab; Nasopharyngeal(NP) swabs in vial transport medium  Result Value Ref Range Status   SARS Coronavirus 2 by RT PCR NEGATIVE NEGATIVE Final    Comment: (NOTE) SARS-CoV-2 target nucleic acids are NOT DETECTED.  The SARS-CoV-2 RNA is generally detectable in upper respiratory specimens during the acute phase of infection. The lowest concentration of SARS-CoV-2 viral copies this assay can detect is 138 copies/mL. A negative result does not preclude SARS-Cov-2 infection and should not be used as the sole basis for treatment or other patient management decisions. A negative result may occur with  improper specimen collection/handling, submission of specimen other than nasopharyngeal swab, presence of viral mutation(s) within the areas targeted by this assay, and inadequate number of viral copies(<138 copies/mL). A negative result must be combined with clinical observations, patient history, and epidemiological information. The expected result is  Negative.  Fact Sheet for Patients:  EntrepreneurPulse.com.au  Fact Sheet for Healthcare Providers:  IncredibleEmployment.be  This test is no t yet approved or cleared by the [place] FDA and  has been authorized for detection and/or diagnosis of SARS-CoV-2 by FDA under an Emergency Use Authorization (EUA). This EUA will remain  in effect (meaning this test can be used) for the duration of the COVID-19 declaration under Section 564(b)(1) of the Act, 21 U.S.C.section 360bbb-3(b)(1), unless the authorization is terminated  or revoked sooner.       Influenza A by PCR NEGATIVE NEGATIVE Final   Influenza B by PCR NEGATIVE NEGATIVE Final    Comment: (NOTE) The Xpert Xpress SARS-CoV-2/FLU/RSV plus assay is intended as an aid in the diagnosis of influenza from Nasopharyngeal swab specimens and should not be used as a sole basis for treatment. Nasal washings and aspirates are unacceptable for Xpert Xpress SARS-CoV-2/FLU/RSV testing.  Fact Sheet for Patients: EntrepreneurPulse.com.au  Fact Sheet for Healthcare Providers: IncredibleEmployment.be  This test is not yet approved or cleared by the [place] FDA and has been authorized for detection and/or diagnosis of SARS-CoV-2 by FDA under an Emergency Use Authorization (EUA). This EUA will remain in effect (meaning this test can be used) for the duration of the COVID-19 declaration under Section 564(b)(1) of the Act, 21 U.S.C. section 360bbb-3(b)(1), unless the authorization is terminated or revoked.  Performed at [facility], [address], [place], [place] [address]          Radiology Studies: No results found.      Scheduled Meds:  alfuzosin  10 mg Oral Q breakfast   atorvastatin  10 mg Oral Daily   calcium carbonate  500 mg of elemental calcium Oral Q breakfast   Chlorhexidine Gluconate Cloth  6 each Topical Daily    cholecalciferol  2,000 Units Oral q AM   diltiazem  30 mg Oral Q6H   ferrous sulfate  325 mg Oral Q breakfast   finasteride  5 mg Oral Daily   furosemide  40 mg Intravenous Once   multivitamin with minerals  1 tablet Oral q AM   tamsulosin  0.4 mg Oral Daily   Continuous Infusions:   LOS: 4 days    Time spent: 32 minutes    Sharen Hones, MD [facility]   To contact the attending provider between 7A-7P or the covering provider  during after hours 7P-7A, please log into the web site [email or web address] and access using universal [place] password for that web site. If you do not have the password, please call the hospital operator.  01/07/2021, 10:13 AM

## 2021-01-07 NOTE — Progress Notes (Signed)
Patient Name: Christopher [name] Date of Encounter: 01/07/2021  Hospital Problem List     Principal Problem:   Acute blood loss anemia Active Problems:   HTN (hypertension)   Benign prostatic hyperplasia with urinary frequency   Persistent atrial fibrillation (HCC)   Urothelial carcinoma of bladder (HCC)   Gross hematuria   Acute renal failure (ARF) (HCC)   Acute hypoxemic respiratory failure [facility])    Patient Profile      87 y.o. male with history of urethral carcinoma of the bladder, paroxysmal atrial fibrillation on chronic anticoagulation therapy, hypertension and hyperlipidemia who is status post cystoscopy with fulguration and cryosurgery on December 20, 2020.  He was discharged to home with a Foley that was removed 2 days later without any problems.  1 day prior to admission on January 03, 2021 he had hematuria and multiple clots per his bladder.  Had 2 syncopal episodes while upright.  He was brought to the emergency room where he was noted to be significantly hypotensive with gross hematuria.  Laboratories revealed creatinine of 1.07.  Hemoglobin was 8.9 down from 13.11-week prior.  Twelve-lead EKG showed sinus rhythm.  He was seen by urology and was taken off of Eliquis which he was on for his A. fib.  On 01/05/2021 underwent cystoscopy with clot evacuation and liberation.  He developed atrial fibrillation with rapid ventricular response and was placed on a diltiazem drip.  His hemodynamics remained stable.  He was somewhat confused.  Consultation called regarding A. fib with RVR.  Patient is currently on diltiazem drip.  He 1 dose of IV digoxin.  He is currently in atrial fibrillation with controlled ventricular response with ventricular rates between the mid 80s to low 100s.  He admits to some confusion but is alert and oriented to person and place.  Subjective   Alert and oriented this morning. States he "had a bad night last night" Apears to be sun downing. Rate is in good control on 5  mg of dilt. iv  Inpatient Medications     alfuzosin  10 mg Oral Q breakfast   atorvastatin  10 mg Oral Daily   calcium carbonate  500 mg of elemental calcium Oral Q breakfast   Chlorhexidine Gluconate Cloth  6 each Topical Daily   cholecalciferol  2,000 Units Oral q AM   ferrous sulfate  325 mg Oral Q breakfast   finasteride  5 mg Oral Daily   multivitamin with minerals  1 tablet Oral q AM   tamsulosin  0.4 mg Oral Daily    Vital Signs    Vitals:   01/07/21 0000 01/07/21 0252 01/07/21 0459 01/07/21 0726  BP: (!) 115/46 (!) 119/47 (!) 123/50 (!) 122/44  Pulse: 69 61 66 (!) 59  Resp:  17 17   Temp:  98.2 F (36.8 C) 98.1 F (36.7 C) 97.9 F (36.6 C)  TempSrc:  Oral Oral Oral  SpO2:  97% 97% 100%  Weight:      Height:        Intake/Output Summary (Last 24 hours) at 01/07/2021 0826 Last data filed at 01/07/2021 0500 Gross per 24 hour  Intake 1254.62 ml  Output 1350 ml  Net -95.38 ml   Filed Weights   01/03/21 1253 01/03/21 2312 01/06/21 0429  Weight: 88.5 kg 90 kg 89.5 kg    Physical Exam    GEN: Well nourished, well developed, in no acute distress.  HEENT: normal.  Neck: Supple, no JVD, carotid bruits, or masses.  Cardiac: irr, irr Respiratory:  Respirations regular and unlabored, clear to auscultation bilaterally. GI: Soft, nontender, nondistended, BS + x 4. MS: no deformity or atrophy. Skin: warm and dry, no rash. Neuro:  Strength and sensation are intact. Psych: Normal affect.  Labs    CBC Recent Labs    01/06/21 0601 01/06/21 1724 01/07/21 0607  WBC 6.8  --  6.0  NEUTROABS 5.2  --  4.3  HGB 7.6* 8.8* 7.9*  HCT 22.8* 26.2* 23.3*  MCV 97.9  --  95.1  PLT 214  --  130   Basic Metabolic Panel Recent Labs    01/06/21 0601 01/07/21 0607  NA 136 136  K 5.1 4.1  CL 104 104  CO2 28 28  GLUCOSE 126* 112*  BUN 43* 39*  CREATININE 2.00* 1.32*  CALCIUM 8.0* 8.3*  MG 2.2 2.4   Liver Function Tests No results for input(s): AST, ALT, ALKPHOS,  BILITOT, PROT, ALBUMIN in the last 72 hours. No results for input(s): LIPASE, AMYLASE in the last 72 hours. Cardiac Enzymes No results for input(s): CKTOTAL, CKMB, CKMBINDEX, TROPONINI in the last 72 hours. BNP No results for input(s): BNP in the last 72 hours. D-Dimer No results for input(s): DDIMER in the last 72 hours. Hemoglobin A1C No results for input(s): HGBA1C in the last 72 hours. Fasting Lipid Panel No results for input(s): CHOL, HDL, LDLCALC, TRIG, CHOLHDL, LDLDIRECT in the last 72 hours. Thyroid Function Tests No results for input(s): TSH, T4TOTAL, T3FREE, THYROIDAB in the last 72 hours.  Invalid input(s): FREET3  Telemetry    Afib with controlled vr  ECG       Radiology    DG OR UROLOGY CYSTO IMAGE ([place])  Result Date: 01/05/2021 There is no interpretation for this exam.  This order is for images obtained during a surgical procedure.  Please See "Surgeries" Tab for more information regarding the procedure.    Assessment & Plan    87 year old male with history of paroxysmal A. fib, history of bladder carcinoma who was admitted with hematuria and penile pain post cystoscopy.  Underwent urologic evaluation.  He was taken off of Eliquis.  Has a history of paroxysmal atrial fibrillation.  Was in sinus rhythm on admission but developed A. fib with RVR.  Currently with good rate control with IV diltiazem.  Blood pressure little soft but currently hemodynamically stable.  1.  A. fib-had A. fib redeveloped with RVR.  He is on Eliquis as an outpatient.  This was held due to his urologic procedures and hematuria.  Currently on a diltiazem drip.  We will continue to remain off Eliquis for now.  Will try converting to po cardizem and d/c drip and follow rate.  He is on metoprolol succinate as an outpatient at low-dose.  2.  Bladder carcinoma-being followed by urology.  Appreciate their input.  Prior to discharge will need to rediscuss whether or not anticoagulation is  appropriate.  3.  Confusion-appears fairly oriented this morning.  Will need to follow.  4.  Anemia-likely secondary to hematuria.  Will follow.  5.  Hypotension-secondary to bleeding most likely.    Signed, Javier Docker Akayla Brass MD 01/07/2021, 8:26 AM  Pager: [phone]

## 2021-01-08 ENCOUNTER — Inpatient Hospital Stay: Payer: Medicare HMO

## 2021-01-08 DIAGNOSIS — R410 Disorientation, unspecified: Secondary | ICD-10-CM

## 2021-01-08 DIAGNOSIS — R310 Gross hematuria: Secondary | ICD-10-CM | POA: Diagnosis not present

## 2021-01-08 DIAGNOSIS — J9601 Acute respiratory failure with hypoxia: Secondary | ICD-10-CM | POA: Diagnosis not present

## 2021-01-08 DIAGNOSIS — D62 Acute posthemorrhagic anemia: Secondary | ICD-10-CM | POA: Diagnosis not present

## 2021-01-08 LAB — MAGNESIUM: Magnesium: 2.2 mg/dL (ref 1.7–2.4)

## 2021-01-08 LAB — CBC WITH DIFFERENTIAL/PLATELET
Abs Immature Granulocytes: 0.12 10*3/uL — ABNORMAL HIGH (ref 0.00–0.07)
Basophils Absolute: 0 10*3/uL (ref 0.0–0.1)
Basophils Relative: 0 %
Eosinophils Absolute: 0.2 10*3/uL (ref 0.0–0.5)
Eosinophils Relative: 2 %
HCT: 24.4 % — ABNORMAL LOW (ref 39.0–52.0)
Hemoglobin: 8 g/dL — ABNORMAL LOW (ref 13.0–17.0)
Immature Granulocytes: 2 %
Lymphocytes Relative: 6 %
Lymphs Abs: 0.5 10*3/uL — ABNORMAL LOW (ref 0.7–4.0)
MCH: 31.3 pg (ref 26.0–34.0)
MCHC: 32.8 g/dL (ref 30.0–36.0)
MCV: 95.3 fL (ref 80.0–100.0)
Monocytes Absolute: 1.6 10*3/uL — ABNORMAL HIGH (ref 0.1–1.0)
Monocytes Relative: 20 %
Neutro Abs: 5.9 10*3/uL (ref 1.7–7.7)
Neutrophils Relative %: 70 %
Platelets: 198 10*3/uL (ref 150–400)
RBC: 2.56 MIL/uL — ABNORMAL LOW (ref 4.22–5.81)
RDW: 15 % (ref 11.5–15.5)
WBC: 8.3 10*3/uL (ref 4.0–10.5)
nRBC: 1 % — ABNORMAL HIGH (ref 0.0–0.2)

## 2021-01-08 LAB — HEMOGLOBIN AND HEMATOCRIT, BLOOD
HCT: 26.9 % — ABNORMAL LOW (ref 39.0–52.0)
Hemoglobin: 8.7 g/dL — ABNORMAL LOW (ref 13.0–17.0)

## 2021-01-08 LAB — BASIC METABOLIC PANEL
Anion gap: 6 (ref 5–15)
BUN: 32 mg/dL — ABNORMAL HIGH (ref 8–23)
CO2: 30 mmol/L (ref 22–32)
Calcium: 8.4 mg/dL — ABNORMAL LOW (ref 8.9–10.3)
Chloride: 102 mmol/L (ref 98–111)
Creatinine, Ser: 1.1 mg/dL (ref 0.61–1.24)
GFR, Estimated: >60 mL/min (ref >60)
Glucose, Bld: 103 mg/dL — ABNORMAL HIGH (ref 70–99)
Potassium: 3.6 mmol/L (ref 3.5–5.1)
Sodium: 138 mmol/L (ref 135–145)

## 2021-01-08 MED ORDER — ENOXAPARIN SODIUM 40 MG/0.4ML IJ SOSY
40.0000 mg | PREFILLED_SYRINGE | INTRAMUSCULAR | Status: DC
  Filled 2021-01-08: qty 0.4

## 2021-01-08 MED ORDER — HALOPERIDOL LACTATE 5 MG/ML IJ SOLN
1.0000 mg | Freq: Four times a day (QID) | INTRAMUSCULAR | Status: DC | PRN
  Administered 2021-01-08: 1 mg via INTRAMUSCULAR
  Filled 2021-01-08: qty 1

## 2021-01-08 MED ORDER — LORAZEPAM 2 MG/ML IJ SOLN: 0.5000 mg | INTRAMUSCULAR | Status: DC | PRN

## 2021-01-08 MED ORDER — QUETIAPINE FUMARATE 25 MG PO TABS
50.0000 mg | ORAL_TABLET | Freq: Every day | ORAL | Status: DC
  Administered 2021-01-08: 50 mg via ORAL
  Filled 2021-01-08: qty 2

## 2021-01-08 NOTE — Progress Notes (Signed)
PROGRESS NOTE    EDIBERTO [name]  ZHY:[id number] DOB: 01-19-1928 DOA: 01/03/2021 PCP: [name], MD   Chief complaint.  Confusion and agitation. Brief Narrative:  Christopher [name] is a 87 y.o. male with medical history significant for urothelial cancer of the bladder, history of atrial fibrillation on chronic anticoagulation therapy, hypertension, dyslipidemia who presents to the ER via EMS for evaluation of 2 witnessed syncopal episodes. Patient recently had cystoscopy with fulguration and cryosurgery for bladder cancer on 6/13.  He had a significant gross hematuria with a large amount of blood clots.  Evaluated by urology, started CBI. Cystoscopy and clot evacuation performed on 6/29. Developed rapid ventricular response with atrial fibrillation on 6/30.  Started on diltiazem drip, then transitioned to oral diltiazem.      Assessment & Plan:   Principal Problem:   Acute blood loss anemia Active Problems:   HTN (hypertension)   Benign prostatic hyperplasia with urinary frequency   Persistent atrial fibrillation (HCC)   Urothelial carcinoma of bladder ([place])   Gross hematuria   Acute renal failure (ARF) ([place])   Acute hypoxemic respiratory failure ([place])  #1.  Delirium. Patient had a poor sleep at night, he had more confusion yesterday evening.  He was also agitated and confused this morning.  This is secondary to hospital environment, stress from surgery. He was placed on Seroquel 25 mg every evening, I will increase to 50 mg.  I will also obtain a CT scan.  Continue as needed Haldol.  #2.  Acute blood loss anemia. Hypovolemic shock, POA Iron deficient anemia. Gross hematuria secondary to bladder malignancy. Patient is status post PRBC transfusion and IV iron. He no longer has any active bleeding.  Hemoglobin 8.0 this morning, will continue to monitor.  3.  Acute hypoxemic respiratory failure. Acute on chronic diastolic congestive heart failure. Received IV Lasix  yesterday, condition seem to be better.  Continue wean off oxygen.  4.  Paroxysmal atrial fibrillation with rapid major response Transient hypotension. Heart rate is better controlled.  5. Acute kidney injury secondary to obstruction. Hyponatremia. Hyperkalemia. Condition has improved.  #6.  Generalized weakness. Due to multiple medical problems including rapid fibrillation, surgery.  Continue PT/OT.  Patient now will need nursing home placement. Based on record, patient also has poor p.o. intake as well as decreased urine output.  Asking nurse to perform a bladder scan make sure patient does not have additional urinary retention.   DVT prophylaxis: Lovenox started. Code Status: Full Family Communication: daughter updated Disposition Plan:    Status is: Inpatient  Remains inpatient appropriate because:Inpatient level of care appropriate due to severity of illness  Dispo: The patient is from: Home              Anticipated d/c is to: SNF              Patient currently is not medically stable to d/c.   Difficult to place patient No        I/O last 3 completed shifts: In: 126 [I.V.:126] Out: 1351 [Urine:1350; Stool:1] Total I/O In: 240 [P.O.:240] Out: 49 [Urine:50]     Consultants:  Urology, cardiology  Procedures: cystoscopy  Antimicrobials: none   Subjective: Patient was confused yesterday evening with sundowning.  He received Haldol, later received Ativan.  He did not sleep well last night. When I walk in the room, patient was naked, has some agitation.  But he seemed to know the date and place. He does not have any short  of breath or cough. He does not have abdominal pain or nausea vomiting. His urine seem to have cleared, no additional hematuria.  No dysuria hematuria. Based on record, patient has decreased urine output and poor p.o. intake.   Objective: Vitals:   01/07/21 1914 01/08/21 0437 01/08/21 0820 01/08/21 1139  BP: (!) 116/41 (!) 146/47 (!)  156/55 (!) 131/57  Pulse: 68 (!) 59 67 60  Resp: 18 18 18 18   Temp: 98.6 F (37 C) 97.9 F (36.6 C) 98.4 F (36.9 C) 98.3 F (36.8 C)  TempSrc: Oral Oral Axillary Oral  SpO2: 98% 98% 98% 100%  Weight:      Height:        Intake/Output Summary (Last 24 hours) at 01/08/2021 1220 Last data filed at 01/08/2021 1030 Gross per 24 hour  Intake 287.76 ml  Output 951 ml  Net -663.24 ml   Filed Weights   01/03/21 1253 01/03/21 2312 01/06/21 0429  Weight: 88.5 kg 90 kg 89.5 kg    Examination:  General exam: Appears calm and comfortable  Respiratory system: Clear to auscultation. Respiratory effort normal. Cardiovascular system: S1 & S2 heard, RRR. No JVD, murmurs, rubs, gallops or clicks. No pedal edema. Gastrointestinal system: Abdomen is nondistended, soft and nontender. No organomegaly or masses felt. Normal bowel sounds heard. Central nervous system: Alert and oriented. No focal neurological deficits. Extremities: Symmetric 5 x 5 power. Skin: No rashes, lesions or ulcers Psychiatry: Judgement and insight appear normal. Mood & affect appropriate.     Data Reviewed: I have personally reviewed following labs and imaging studies  CBC: Recent Labs  Lab 01/03/21 1257 01/03/21 1614 01/03/21 2321 01/06/21 0601 01/06/21 1724 01/07/21 0607 01/07/21 1702 01/08/21 0517  WBC 7.9 7.3  --  6.8  --  6.0  --  8.3  NEUTROABS  --   --   --  5.2  --  4.3  --  5.9  HGB 8.9* 7.9*   < > 7.6* 8.8* 7.9* 8.4* 8.0*  HCT 26.7* 23.6*   < > 22.8* 26.2* 23.3* 25.6* 24.4*  MCV 97.1 97.1  --  97.9  --  95.1  --  95.3  PLT 227 196  --  214  --  193  --  198   < > = values in this interval not displayed.   Basic Metabolic Panel: Recent Labs  Lab 01/04/21 0548 01/04/21 1550 01/05/21 0638 01/06/21 0601 01/07/21 0607 01/08/21 0517  NA 134*  --  135 136 136 138  K 5.7* 5.3* 5.1 5.1 4.1 3.6  CL 102  --  100 104 104 102  CO2 28  --  26 28 28 30   GLUCOSE 145*  --  153* 126* 112* 103*  BUN 22  --   31* 43* 39* 32*  CREATININE 1.49*  --  2.42* 2.00* 1.32* 1.10  CALCIUM 8.4*  --  8.5* 8.0* 8.3* 8.4*  MG  --   --   --  2.2 2.4 2.2   GFR: Estimated Creatinine Clearance: 47.5 mL/min (by C-G formula based on SCr of 1.1 mg/dL). Liver Function Tests: No results for input(s): AST, ALT, ALKPHOS, BILITOT, PROT, ALBUMIN in the last 168 hours. No results for input(s): LIPASE, AMYLASE in the last 168 hours. No results for input(s): AMMONIA in the last 168 hours. Coagulation Profile: No results for input(s): INR, PROTIME in the last 168 hours. Cardiac Enzymes: No results for input(s): CKTOTAL, CKMB, CKMBINDEX, TROPONINI in the last 168 hours. BNP (last 3  results) No results for input(s): PROBNP in the last 8760 hours. HbA1C: No results for input(s): HGBA1C in the last 72 hours. CBG: No results for input(s): GLUCAP in the last 168 hours. Lipid Profile: No results for input(s): CHOL, HDL, LDLCALC, TRIG, CHOLHDL, LDLDIRECT in the last 72 hours. Thyroid Function Tests: No results for input(s): TSH, T4TOTAL, FREET4, T3FREE, THYROIDAB in the last 72 hours. Anemia Panel: No results for input(s): VITAMINB12, FOLATE, FERRITIN, TIBC, IRON, RETICCTPCT in the last 72 hours. Sepsis Labs: No results for input(s): PROCALCITON, LATICACIDVEN in the last 168 hours.  Recent Results (from the past 240 hour(s))  Resp Panel by RT-PCR (Flu A&B, Covid) Nasopharyngeal Swab     Status: None   Collection Time: 01/03/21  3:22 PM   Specimen: Nasopharyngeal Swab; Nasopharyngeal(NP) swabs in vial transport medium  Result Value Ref Range Status   SARS Coronavirus 2 by RT PCR NEGATIVE NEGATIVE Final    Comment: (NOTE) SARS-CoV-2 target nucleic acids are NOT DETECTED.  The SARS-CoV-2 RNA is generally detectable in upper respiratory specimens during the acute phase of infection. The lowest concentration of SARS-CoV-2 viral copies this assay can detect is 138 copies/mL. A negative result does not preclude  SARS-Cov-2 infection and should not be used as the sole basis for treatment or other patient management decisions. A negative result may occur with  improper specimen collection/handling, submission of specimen other than nasopharyngeal swab, presence of viral mutation(s) within the areas targeted by this assay, and inadequate number of viral copies(<138 copies/mL). A negative result must be combined with clinical observations, patient history, and epidemiological information. The expected result is Negative.  Fact Sheet for Patients:  EntrepreneurPulse.com.au  Fact Sheet for Healthcare Providers:  IncredibleEmployment.be  This test is no t yet approved or cleared by the [place] FDA and  has been authorized for detection and/or diagnosis of SARS-CoV-2 by FDA under an Emergency Use Authorization (EUA). This EUA will remain  in effect (meaning this test can be used) for the duration of the COVID-19 declaration under Section 564(b)(1) of the Act, 21 U.S.C.section 360bbb-3(b)(1), unless the authorization is terminated  or revoked sooner.       Influenza A by PCR NEGATIVE NEGATIVE Final   Influenza B by PCR NEGATIVE NEGATIVE Final    Comment: (NOTE) The Xpert Xpress SARS-CoV-2/FLU/RSV plus assay is intended as an aid in the diagnosis of influenza from Nasopharyngeal swab specimens and should not be used as a sole basis for treatment. Nasal washings and aspirates are unacceptable for Xpert Xpress SARS-CoV-2/FLU/RSV testing.  Fact Sheet for Patients: EntrepreneurPulse.com.au  Fact Sheet for Healthcare Providers: IncredibleEmployment.be  This test is not yet approved or cleared by the [place] FDA and has been authorized for detection and/or diagnosis of SARS-CoV-2 by FDA under an Emergency Use Authorization (EUA). This EUA will remain in effect (meaning this test can be used) for the duration of  the COVID-19 declaration under Section 564(b)(1) of the Act, 21 U.S.C. section 360bbb-3(b)(1), unless the authorization is terminated or revoked.  Performed at [facility], [address], [place], [place] [address]          Radiology Studies: ECHOCARDIOGRAM COMPLETE  Result Date: 01/07/2021    ECHOCARDIOGRAM REPORT   Patient Name:   TIMOHTY RENBARGER Date of Exam: 01/07/2021 Medical Rec #:  [id number]         Height:       71.0 in Accession #:    [id number]  Weight:       197.3 lb Date of Birth:  Mar 25, 1928         BSA:          2.096 m Patient Age:    89 years          BP:           111/83 mmHg Patient Gender: M                 HR:           65 bpm. Exam Location:  [facility] Procedure: 2D Echo, Cardiac Doppler and Color Doppler Indications:     CHF-acute diastolic O17.51  History:         Patient has prior history of Echocardiogram examinations, most                  recent 03/01/2015. Pacemaker, Arrythmias:Atrial Fibrillation;                  Risk Factors:Dyslipidemia.  Sonographer:     Sherrie Sport RDCS (AE) Referring Phys:  [id number] Sharen Hones Diagnosing Phys: Bartholome Bill MD  Sonographer Comments: Technically challenging study due to limited acoustic windows, suboptimal apical window and suboptimal subcostal window. IMPRESSIONS  1. Left ventricular ejection fraction, by estimation, is 70 to 75%. The left ventricle has hyperdynamic function. The left ventricle has no regional wall motion abnormalities. There is mild left ventricular hypertrophy. Left ventricular diastolic parameters are consistent with Grade I diastolic dysfunction (impaired relaxation).  2. Right ventricular systolic function is normal. The right ventricular size is normal.  3. Right atrial size was mildly dilated.  4. The mitral valve is grossly normal. Trivial mitral valve regurgitation.  5. The aortic valve is grossly normal. Aortic valve regurgitation is trivial. Mild to moderate aortic valve sclerosis/calcification  is present, without any evidence of aortic stenosis. FINDINGS  Left Ventricle: Left ventricular ejection fraction, by estimation, is 70 to 75%. The left ventricle has hyperdynamic function. The left ventricle has no regional wall motion abnormalities. The left ventricular internal cavity size was normal in size. There is mild left ventricular hypertrophy. Left ventricular diastolic parameters are consistent with Grade I diastolic dysfunction (impaired relaxation). Right Ventricle: The right ventricular size is normal. No increase in right ventricular wall thickness. Right ventricular systolic function is normal. Left Atrium: Left atrial size was normal in size. Right Atrium: Right atrial size was mildly dilated. Pericardium: There is no evidence of pericardial effusion. Mitral Valve: The mitral valve is grossly normal. Trivial mitral valve regurgitation. Tricuspid Valve: The tricuspid valve is grossly normal. Tricuspid valve regurgitation is trivial. Aortic Valve: The aortic valve is grossly normal. Aortic valve regurgitation is trivial. Mild to moderate aortic valve sclerosis/calcification is present, without any evidence of aortic stenosis. Aortic valve mean gradient measures 11.7 mmHg. Aortic valve peak gradient measures 21.6 mmHg. Aortic valve area, by VTI measures 1.57 cm. Pulmonic Valve: The pulmonic valve was not well visualized. Pulmonic valve regurgitation is not visualized. Aorta: The aortic root is normal in size and structure. IAS/Shunts: The atrial septum is grossly normal.  LEFT VENTRICLE PLAX 2D LVIDd:         3.71 cm  Diastology LVIDs:         2.03 cm  LV e' medial:    5.33 cm/s LV PW:         1.34 cm  LV E/e' medial:  21.6 LV IVS:        1.45 cm  LV e' lateral:   9.68 cm/s LVOT diam:     2.00 cm  LV E/e' lateral: 11.9 LV SV:         63 LV SV Index:   30 LVOT Area:     3.14 cm  RIGHT VENTRICLE RV S prime:     16.40 cm/s TAPSE (M-mode): 4.8 cm LEFT ATRIUM              Index       RIGHT ATRIUM            Index LA diam:        3.60 cm  1.72 cm/m  RA Area:     27.40 cm LA Vol (A2C):   74.0 ml  35.30 ml/m RA Volume:   85.00 ml  40.54 ml/m LA Vol (A4C):   119.0 ml 56.76 ml/m LA Biplane Vol: 103.0 ml 49.13 ml/m  AORTIC VALVE                    PULMONIC VALVE AV Area (Vmax):    1.66 cm     PV Vmax:        0.93 m/s AV Area (Vmean):   1.53 cm     PV Peak grad:   3.5 mmHg AV Area (VTI):     1.57 cm     RVOT Peak grad: 3 mmHg AV Vmax:           232.33 cm/s AV Vmean:          161.333 cm/s AV VTI:            0.397 m AV Peak Grad:      21.6 mmHg AV Mean Grad:      11.7 mmHg LVOT Vmax:         123.00 cm/s LVOT Vmean:        78.500 cm/s LVOT VTI:          0.199 m LVOT/AV VTI ratio: 0.50  AORTA Ao Root diam: 3.50 cm MITRAL VALVE                TRICUSPID VALVE MV Area (PHT): 3.54 cm     TR Peak grad:   18.8 mmHg MV Decel Time: 214 msec     TR Vmax:        217.00 cm/s MV E velocity: 115.00 cm/s MV A velocity: 68.60 cm/s   SHUNTS MV E/A ratio:  1.68         Systemic VTI:  0.20 m                             Systemic Diam: 2.00 cm Bartholome Bill MD Electronically signed by Bartholome Bill MD Signature Date/Time: 01/07/2021/3:19:25 PM    Final         Scheduled Meds:  alfuzosin  10 mg Oral Q breakfast   atorvastatin  10 mg Oral Daily   calcium carbonate  500 mg of elemental calcium Oral Q breakfast   Chlorhexidine Gluconate Cloth  6 each Topical Daily   cholecalciferol  2,000 Units Oral q AM   diltiazem  30 mg Oral Q6H   ferrous sulfate  325 mg Oral Q breakfast   finasteride  5 mg Oral Daily   multivitamin with minerals  1 tablet Oral q AM   QUEtiapine  50 mg Oral QHS   tamsulosin  0.4 mg Oral Daily   Continuous Infusions:   LOS:  5 days    Time spent: 35 minutes    Sharen Hones, MD [facility]   To contact the attending provider between 7A-7P or the covering provider during after hours 7P-7A, please log into the web site [email or web address] and access using universal [place] password for that web  site. If you do not have the password, please call the hospital operator.  01/08/2021, 12:20 PM

## 2021-01-08 NOTE — TOC Initial Note (Signed)
Transition of Care [facility]) - Initial/Assessment Note    Patient Details  Name: Christopher [name] MRN: [id number] Date of Birth: July 08, 1928  [facility] of [facility] CM/SW Contact:    Alberteen Sam, LCSW Phone Number: 01/08/2021, 9:26 AM  Clinical Narrative:                  Due to patient notable intermittent confusion CSW called patient's daughter Christopher [name]. She is in agreement with PT/OT recommendation of SNF at time of discharge. She reports patient was previously living alone and active prior to hospital admission, and that his confusion and lack of independence with mobility is new. She also requested CSW reach out to her sister Lattie Haw. CSW has done so as well.   Both in agreement with CSW sending referrals to facilities in [place], [place] and [place].   CSW explained to daughters that due to it being weekend and a holiday, may be a delay in bed offer acceptance and delay in insurance auth once facility identified.   CSW has faxed out referrals, pending bed offers at this time.   Expected Discharge Plan: Skilled Nursing Facility Barriers to Discharge: Continued Medical Work up   Patient Goals and CMS Choice   CMS Medicare.gov Compare Post Acute Care list provided to:: Patient Represenative (must comment) (daughters) Choice offered to / list presented to : Adult Children  Expected Discharge Plan and Services Expected Discharge Plan: [place] Acute Care Choice: [place] Living arrangements for the past 2 months: Single Family Home                                      Prior Living Arrangements/Services Living arrangements for the past 2 months: Single Family Home Lives with:: Self Patient language and need for interpreter reviewed:: Yes        Need for Family Participation in Patient Care: Yes (Comment) Care giver support system in place?: Yes (comment)   Criminal Activity/Legal Involvement Pertinent to Current  Situation/Hospitalization: No - Comment as needed  Activities of Daily Living Home Assistive Devices/Equipment: None ADL Screening (condition at time of admission) Patient's cognitive ability adequate to safely complete daily activities?: Yes Is the patient deaf or have difficulty hearing?: Yes Does the patient have difficulty seeing, even when wearing glasses/contacts?: No Does the patient have difficulty concentrating, remembering, or making decisions?: No Patient able to express need for assistance with ADLs?: Yes Does the patient have difficulty dressing or bathing?: No Independently performs ADLs?: Yes (appropriate for developmental age) Does the patient have difficulty walking or climbing stairs?: No Weakness of Legs: None Weakness of Arms/Hands: None  Permission Sought/Granted   Permission granted to share information with : Yes, Verbal Permission Granted  Share Information with NAME: Christopher [name]  Permission granted to share info w AGENCY: SNFs  Permission granted to share info w Relationship: daughter  Permission granted to share info w Contact Information: [phone]  Emotional Assessment Appearance:: Appears stated age     Orientation: : Oriented to Self, Oriented to  Time, Oriented to Situation, Fluctuating Orientation (Suspected and/or reported Sundowners) Alcohol / Substance Use: Not Applicable Psych Involvement: No (comment)  Admission diagnosis:  Syncope and collapse [R55] Gross hematuria [R31.0] Patient Active Problem List   Diagnosis Date Noted   Acute renal failure (ARF) ([place]) 01/05/2021   Acute hypoxemic respiratory failure ([place]) 01/05/2021   Gross hematuria  01/03/2021   Acute blood loss anemia 01/03/2021   Iron deficiency anemia due to chronic blood loss 12/18/2019   Urothelial carcinoma of bladder ([place]) 10/29/2019   Chronic anticoagulation 07/15/2019   Lesion of peritoneum 06/11/2019   Imbalance 01/29/2019   Lumbar degenerative disc disease 12/04/2018    Primary osteoarthritis of left knee 04/11/2018   Persistent atrial fibrillation ([place]) 10/11/2016   Chronic right-sided low back pain without sciatica 06/14/2015   Dizziness 03/10/2015   Symptomatic sinus bradycardia 03/10/2015   Syncope and collapse 02/28/2015   Symptomatic bradycardia 02/28/2015   HTN (hypertension) 02/28/2015   Hypercholesteremia 02/28/2015   Benign prostatic hyperplasia with urinary frequency 02/28/2015   Alcohol use 02/28/2015   Other specified health status 02/28/2015   Asymptomatic proteinuria 01/04/2015   Chronic leukopenia 11/14/2013   Glucose intolerance (impaired glucose tolerance) 11/14/2013   High risk medication use 11/14/2013   Dupuytren contracture 09/29/2013   ED (erectile dysfunction) 09/29/2013   Rosanna Randy syndrome 09/29/2013   Macular degeneration 09/29/2013   Osteopenia 09/29/2013   SNHL (sensorineural hearing loss) 09/29/2013   PCP:  [name], MD Pharmacy:   [id number] - [place], [place] [address] [place] [address] Phone: [phone] Fax: [phone]  [facility] [place] [place] [facility] [place] [address] Phone: [phone] Fax: [phone]     Social Determinants of Health (SDOH) Interventions    Readmission Risk Interventions No flowsheet data found.

## 2021-01-08 NOTE — Progress Notes (Signed)
Patient Name: Christopher [name] Date of Encounter: 01/08/2021  Hospital Problem List     Principal Problem:   Acute blood loss anemia Active Problems:   HTN (hypertension)   Benign prostatic hyperplasia with urinary frequency   Persistent atrial fibrillation (HCC)   Urothelial carcinoma of bladder (HCC)   Gross hematuria   Acute renal failure (ARF) (HCC)   Acute hypoxemic respiratory failure [facility])    Patient Profile       87 y.o. male with history of urethral carcinoma of the bladder, paroxysmal atrial fibrillation on chronic anticoagulation therapy, hypertension and hyperlipidemia who is status post cystoscopy with fulguration and cryosurgery on December 20, 2020.  He was discharged to home with a Foley that was removed 2 days later without any problems.  1 day prior to admission on January 03, 2021 he had hematuria and multiple clots per his bladder.  Had 2 syncopal episodes while upright.  He was brought to the emergency room where he was noted to be significantly hypotensive with gross hematuria.  Laboratories revealed creatinine of 1.07.  Hemoglobin was 8.9 down from 13.11-week prior.  Twelve-lead EKG showed sinus rhythm.  He was seen by urology and was taken off of Eliquis which he was on for his A. fib.  On 01/05/2021 underwent cystoscopy with clot evacuation and liberation.  He developed atrial fibrillation with rapid ventricular response and was placed on a diltiazem drip.  His hemodynamics remained stable.  He was somewhat confused.  Consultation called regarding A. fib with RVR.  Patient is currently on diltiazem drip.  He 1 dose of IV digoxin.  He is currently in atrial fibrillation with controlled ventricular response with ventricular rates between the mid 80s to low 100s.    Subjective   Somewhat agitated and confused this morning although is able to correctly state his location, the date and was able to give other appropriate information.  Likely sundowning.  Inpatient Medications      alfuzosin  10 mg Oral Q breakfast   atorvastatin  10 mg Oral Daily   calcium carbonate  500 mg of elemental calcium Oral Q breakfast   Chlorhexidine Gluconate Cloth  6 each Topical Daily   cholecalciferol  2,000 Units Oral q AM   diltiazem  30 mg Oral Q6H   ferrous sulfate  325 mg Oral Q breakfast   finasteride  5 mg Oral Daily   multivitamin with minerals  1 tablet Oral q AM   QUEtiapine  25 mg Oral QHS   tamsulosin  0.4 mg Oral Daily    Vital Signs    Vitals:   01/07/21 1617 01/07/21 1914 01/08/21 0437 01/08/21 0820  BP: 106/84 (!) 116/41 (!) 146/47 (!) 156/55  Pulse: 75 68 (!) 59 67  Resp:  18 18 18   Temp: 99.5 F (37.5 C) 98.6 F (37 C) 97.9 F (36.6 C) 98.4 F (36.9 C)  TempSrc: Oral Oral Oral Axillary  SpO2: 96% 98% 98% 98%  Weight:      Height:        Intake/Output Summary (Last 24 hours) at 01/08/2021 0954 Last data filed at 01/07/2021 1600 Gross per 24 hour  Intake 47.76 ml  Output 901 ml  Net -853.24 ml   Filed Weights   01/03/21 1253 01/03/21 2312 01/06/21 0429  Weight: 88.5 kg 90 kg 89.5 kg    Physical Exam    GEN: Chronically ill-appearing male.Marland Kitchen  HEENT: normal.  Neck: Supple, no JVD, carotid bruits, or masses. Cardiac:  Irregular regular Respiratory:  Respirations regular and unlabored, clear to auscultation bilaterally. GI: Soft, nontender, nondistended, BS + x 4. MS: no deformity or atrophy. Skin: warm and dry, no rash. Neuro:  Strength and sensation are intact. Psych: Somewhat agitated.  Labs    CBC Recent Labs    01/07/21 0607 01/07/21 1702 01/08/21 0517  WBC 6.0  --  8.3  NEUTROABS 4.3  --  5.9  HGB 7.9* 8.4* 8.0*  HCT 23.3* 25.6* 24.4*  MCV 95.1  --  95.3  PLT 193  --  323   Basic Metabolic Panel Recent Labs    01/07/21 0607 01/08/21 0517  NA 136 138  K 4.1 3.6  CL 104 102  CO2 28 30  GLUCOSE 112* 103*  BUN 39* 32*  CREATININE 1.32* 1.10  CALCIUM 8.3* 8.4*  MG 2.4 2.2   Liver Function Tests No results for  input(s): AST, ALT, ALKPHOS, BILITOT, PROT, ALBUMIN in the last 72 hours. No results for input(s): LIPASE, AMYLASE in the last 72 hours. Cardiac Enzymes No results for input(s): CKTOTAL, CKMB, CKMBINDEX, TROPONINI in the last 72 hours. BNP No results for input(s): BNP in the last 72 hours. D-Dimer No results for input(s): DDIMER in the last 72 hours. Hemoglobin A1C No results for input(s): HGBA1C in the last 72 hours. Fasting Lipid Panel No results for input(s): CHOL, HDL, LDLCALC, TRIG, CHOLHDL, LDLDIRECT in the last 72 hours. Thyroid Function Tests No results for input(s): TSH, T4TOTAL, T3FREE, THYROIDAB in the last 72 hours.  Invalid input(s): FREET3  Telemetry    Atrial fibrillation with ventricular response 80-100.  Intermittently pacing.  ECG    On admission sinus rhythm  Radiology    DG OR UROLOGY CYSTO IMAGE ([place])  Result Date: 01/05/2021 There is no interpretation for this exam.  This order is for images obtained during a surgical procedure.  Please See "Surgeries" Tab for more information regarding the procedure.   ECHOCARDIOGRAM COMPLETE  Result Date: 01/07/2021    ECHOCARDIOGRAM REPORT   Patient Name:   CROCKETT RALLO Date of Exam: 01/07/2021 Medical Rec #:  [id number]         Height:       71.0 in Accession #:    [id number]        Weight:       197.3 lb Date of Birth:  1927/10/06         BSA:          2.096 m Patient Age:    41 years          BP:           111/83 mmHg Patient Gender: M                 HR:           65 bpm. Exam Location:  [facility] Procedure: 2D Echo, Cardiac Doppler and Color Doppler Indications:     CHF-acute diastolic G31.51  History:         Patient has prior history of Echocardiogram examinations, most                  recent 03/01/2015. Pacemaker, Arrythmias:Atrial Fibrillation;                  Risk Factors:Dyslipidemia.  Sonographer:     Sherrie Sport RDCS (AE) Referring Phys:  [id number] Sharen Hones Diagnosing Phys: Bartholome Bill MD  Sonographer  Comments: Technically challenging study due to limited acoustic  windows, suboptimal apical window and suboptimal subcostal window. IMPRESSIONS  1. Left ventricular ejection fraction, by estimation, is 70 to 75%. The left ventricle has hyperdynamic function. The left ventricle has no regional wall motion abnormalities. There is mild left ventricular hypertrophy. Left ventricular diastolic parameters are consistent with Grade I diastolic dysfunction (impaired relaxation).  2. Right ventricular systolic function is normal. The right ventricular size is normal.  3. Right atrial size was mildly dilated.  4. The mitral valve is grossly normal. Trivial mitral valve regurgitation.  5. The aortic valve is grossly normal. Aortic valve regurgitation is trivial. Mild to moderate aortic valve sclerosis/calcification is present, without any evidence of aortic stenosis. FINDINGS  Left Ventricle: Left ventricular ejection fraction, by estimation, is 70 to 75%. The left ventricle has hyperdynamic function. The left ventricle has no regional wall motion abnormalities. The left ventricular internal cavity size was normal in size. There is mild left ventricular hypertrophy. Left ventricular diastolic parameters are consistent with Grade I diastolic dysfunction (impaired relaxation). Right Ventricle: The right ventricular size is normal. No increase in right ventricular wall thickness. Right ventricular systolic function is normal. Left Atrium: Left atrial size was normal in size. Right Atrium: Right atrial size was mildly dilated. Pericardium: There is no evidence of pericardial effusion. Mitral Valve: The mitral valve is grossly normal. Trivial mitral valve regurgitation. Tricuspid Valve: The tricuspid valve is grossly normal. Tricuspid valve regurgitation is trivial. Aortic Valve: The aortic valve is grossly normal. Aortic valve regurgitation is trivial. Mild to moderate aortic valve sclerosis/calcification is present, without any  evidence of aortic stenosis. Aortic valve mean gradient measures 11.7 mmHg. Aortic valve peak gradient measures 21.6 mmHg. Aortic valve area, by VTI measures 1.57 cm. Pulmonic Valve: The pulmonic valve was not well visualized. Pulmonic valve regurgitation is not visualized. Aorta: The aortic root is normal in size and structure. IAS/Shunts: The atrial septum is grossly normal.  LEFT VENTRICLE PLAX 2D LVIDd:         3.71 cm  Diastology LVIDs:         2.03 cm  LV e' medial:    5.33 cm/s LV PW:         1.34 cm  LV E/e' medial:  21.6 LV IVS:        1.45 cm  LV e' lateral:   9.68 cm/s LVOT diam:     2.00 cm  LV E/e' lateral: 11.9 LV SV:         63 LV SV Index:   30 LVOT Area:     3.14 cm  RIGHT VENTRICLE RV S prime:     16.40 cm/s TAPSE (M-mode): 4.8 cm LEFT ATRIUM              Index       RIGHT ATRIUM           Index LA diam:        3.60 cm  1.72 cm/m  RA Area:     27.40 cm LA Vol (A2C):   74.0 ml  35.30 ml/m RA Volume:   85.00 ml  40.54 ml/m LA Vol (A4C):   119.0 ml 56.76 ml/m LA Biplane Vol: 103.0 ml 49.13 ml/m  AORTIC VALVE                    PULMONIC VALVE AV Area (Vmax):    1.66 cm     PV Vmax:        0.93 m/s AV Area (Vmean):  1.53 cm     PV Peak grad:   3.5 mmHg AV Area (VTI):     1.57 cm     RVOT Peak grad: 3 mmHg AV Vmax:           232.33 cm/s AV Vmean:          161.333 cm/s AV VTI:            0.397 m AV Peak Grad:      21.6 mmHg AV Mean Grad:      11.7 mmHg LVOT Vmax:         123.00 cm/s LVOT Vmean:        78.500 cm/s LVOT VTI:          0.199 m LVOT/AV VTI ratio: 0.50  AORTA Ao Root diam: 3.50 cm MITRAL VALVE                TRICUSPID VALVE MV Area (PHT): 3.54 cm     TR Peak grad:   18.8 mmHg MV Decel Time: 214 msec     TR Vmax:        217.00 cm/s MV E velocity: 115.00 cm/s MV A velocity: 68.60 cm/s   SHUNTS MV E/A ratio:  1.68         Systemic VTI:  0.20 m                             Systemic Diam: 2.00 cm Bartholome Bill MD Electronically signed by Bartholome Bill MD Signature Date/Time:  01/07/2021/3:19:25 PM    Final     Assessment & Plan    87 year old male with history of paroxysmal A. fib, history of bladder carcinoma who was admitted with hematuria and penile pain post cystoscopy.  Underwent urologic evaluation.  He was taken off of Eliquis.  Has a history of paroxysmal atrial fibrillation.  Was in sinus rhythm on admission but developed A. fib with RVR. Blood pressure little soft but currently hemodynamically stable.  1.  A. fib-had A. fib redeveloped with RVR.  He is on Eliquis as an outpatient.  This was held due to his urologic procedures and hematuria.  Was converted from Cardizem drip to 30 mg Cardizem p.o. every 6.  Rate is fairly stable on this.  Does increase somewhat during his agitated periods.  Would continue with metoprolol as well..  We will continue to remain off Eliquis for now.    2.  Bladder carcinoma-being followed by urology.  Appreciate their input.  Prior to discharge will need to rediscuss whether or not anticoagulation is appropriate.  3.  Confusion-agitated this morning.  He is however able to hold correctly state his location and date.  Some of this appears to be situational due to sundowning.  Is nonfocal.  4.  Anemia-likely secondary to hematuria.  Will follow.  5.  Hypotension-stable at present. Signed, Javier Docker Naidelyn Parrella MD 01/08/2021, 9:54 AM  Pager: [phone]

## 2021-01-08 NOTE — NC FL2 (Signed)
[place] LEVEL OF CARE SCREENING TOOL     IDENTIFICATION  Patient Name: Christopher [name] Birthdate: February 13, 1928 Sex: male Admission Date (Current Location): 01/03/2021  [facility] [place] Number:  Engineering geologist and Address:  [facility], [address], [place], [place] [address]      Provider Number: [id number]  Attending Physician Name and Address:  Sharen Hones, MD  Relative Name and Phone Number:  Magda Paganini (Daughter) [phone]    Current Level of Care: Hospital Recommended Level of Care: [place] Prior Approval Number:    Date Approved/Denied:   PASRR Number: [id number]  Discharge Plan: SNF    Current Diagnoses: Patient Active Problem List   Diagnosis Date Noted   Acute renal failure (ARF) ([place]) 01/05/2021   Acute hypoxemic respiratory failure ([place]) 01/05/2021   Gross hematuria 01/03/2021   Acute blood loss anemia 01/03/2021   Iron deficiency anemia due to chronic blood loss 12/18/2019   Urothelial carcinoma of bladder ([place]) 10/29/2019   Chronic anticoagulation 07/15/2019   Lesion of peritoneum 06/11/2019   Imbalance 01/29/2019   Lumbar degenerative disc disease 12/04/2018   Primary osteoarthritis of left knee 04/11/2018   Persistent atrial fibrillation ([place]) 10/11/2016   Chronic right-sided low back pain without sciatica 06/14/2015   Dizziness 03/10/2015   Symptomatic sinus bradycardia 03/10/2015   Syncope and collapse 02/28/2015   Symptomatic bradycardia 02/28/2015   HTN (hypertension) 02/28/2015   Hypercholesteremia 02/28/2015   Benign prostatic hyperplasia with urinary frequency 02/28/2015   Alcohol use 02/28/2015   Other specified health status 02/28/2015   Asymptomatic proteinuria 01/04/2015   Chronic leukopenia 11/14/2013   Glucose intolerance (impaired glucose tolerance) 11/14/2013   High risk medication use 11/14/2013   Dupuytren contracture 09/29/2013   ED (erectile dysfunction)  09/29/2013   Rosanna Randy syndrome 09/29/2013   Macular degeneration 09/29/2013   Osteopenia 09/29/2013   SNHL (sensorineural hearing loss) 09/29/2013    Orientation RESPIRATION BLADDER Height & Weight     Self, Time, Situation, Place (fluctuating orientation)  O2 (2L nasal cannula) Incontinent, External catheter Weight: 197 lb 5 oz (89.5 kg) Height:  5\' 11"  (180.3 cm)  BEHAVIORAL SYMPTOMS/MOOD NEUROLOGICAL BOWEL NUTRITION STATUS      Continent Diet (see discharge summary)  AMBULATORY STATUS COMMUNICATION OF NEEDS Skin   Extensive Assist Verbally Other (Comment) (closed incision penis)                       Personal Care Assistance Level of Assistance  Bathing, Feeding, Dressing, Total care Bathing Assistance: Maximum assistance Feeding assistance: Limited assistance (able to feed self, needs set up) Dressing Assistance: Limited assistance Total Care Assistance: Limited assistance   Functional Limitations Info  Sight, Hearing, Speech Sight Info: Adequate Hearing Info: Adequate Speech Info: Adequate    SPECIAL CARE FACTORS FREQUENCY  PT (By licensed PT), OT (By licensed OT)     PT Frequency: min 4x weekly OT Frequency: min 4x weekly            Contractures Contractures Info: Not present    Additional Factors Info  Code Status, Allergies Code Status Info: full Allergies Info: Atenolol           Current Medications (01/08/2021):  This is the current hospital active medication list Current Facility-Administered Medications  Medication Dose Route Frequency Provider Last Rate Last Admin   acetaminophen (TYLENOL) tablet 650 mg  650 mg Oral Q6H PRN Collier Bullock, MD  Or   acetaminophen (TYLENOL) suppository 650 mg  650 mg Rectal Q6H PRN Agbata, Tochukwu, MD       alfuzosin (UROXATRAL) 24 hr tablet 10 mg  10 mg Oral Q breakfast Agbata, Tochukwu, MD   10 mg at 01/08/21 1610   atorvastatin (LIPITOR) tablet 10 mg  10 mg Oral Daily Agbata, Tochukwu, MD   10 mg at  01/07/21 2133   belladonna-opium (B&O) suppository 16.2-60mg   1 suppository Rectal Q6H PRN Agbata, Tochukwu, MD   1 suppository at 01/06/21 0154   calcium carbonate (OS-CAL - dosed in mg of elemental calcium) tablet 500 mg of elemental calcium  500 mg of elemental calcium Oral Q breakfast Agbata, Tochukwu, MD   500 mg of elemental calcium at 01/08/21 0829   Chlorhexidine Gluconate Cloth 2 % PADS 6 each  6 each Topical Daily Agbata, Tochukwu, MD   6 each at 01/07/21 0913   cholecalciferol (VITAMIN D3) tablet 2,000 Units  2,000 Units Oral q AM Agbata, Tochukwu, MD   2,000 Units at 01/08/21 0835   diltiazem (CARDIZEM) tablet 30 mg  30 mg Oral Q6H Teodoro Spray, MD   30 mg at 01/08/21 9604   ferrous sulfate tablet 325 mg  325 mg Oral Q breakfast Agbata, Tochukwu, MD   325 mg at 01/08/21 0829   finasteride (PROSCAR) tablet 5 mg  5 mg Oral Daily Agbata, Tochukwu, MD   5 mg at 01/08/21 0829   haloperidol lactate (HALDOL) injection 1 mg  1 mg Intramuscular Q6H PRN Mansy, Jan A, MD   1 mg at 01/08/21 5409   HYDROmorphone (DILAUDID) injection 0.5 mg  0.5 mg Intravenous Q2H PRN Sharen Hones, MD   0.5 mg at 01/05/21 2320   LORazepam (ATIVAN) injection 0.5 mg  0.5 mg Intravenous Q4H PRN Mansy, Jan A, MD       multivitamin with minerals tablet 1 tablet  1 tablet Oral q AM Agbata, Tochukwu, MD   1 tablet at 01/08/21 0835   ondansetron (ZOFRAN) tablet 4 mg  4 mg Oral Q6H PRN Agbata, Tochukwu, MD       Or   ondansetron (ZOFRAN) injection 4 mg  4 mg Intravenous Q6H PRN Agbata, Tochukwu, MD       QUEtiapine (SEROQUEL) tablet 25 mg  25 mg Oral QHS Sharen Hones, MD   25 mg at 01/07/21 2134   tamsulosin (FLOMAX) capsule 0.4 mg  0.4 mg Oral Daily Agbata, Tochukwu, MD   0.4 mg at 01/08/21 8119     Discharge Medications: Please see discharge summary for a list of discharge medications.  Relevant Imaging Results:  Relevant Lab Results:   Additional Information SSN:[id number]  Alberteen Sam, LCSW

## 2021-01-09 DIAGNOSIS — R410 Disorientation, unspecified: Secondary | ICD-10-CM | POA: Diagnosis not present

## 2021-01-09 DIAGNOSIS — J9601 Acute respiratory failure with hypoxia: Secondary | ICD-10-CM | POA: Diagnosis not present

## 2021-01-09 DIAGNOSIS — D62 Acute posthemorrhagic anemia: Secondary | ICD-10-CM | POA: Diagnosis not present

## 2021-01-09 LAB — CBC WITH DIFFERENTIAL/PLATELET
Abs Immature Granulocytes: 0.16 10*3/uL — ABNORMAL HIGH (ref 0.00–0.07)
Basophils Absolute: 0 10*3/uL (ref 0.0–0.1)
Basophils Relative: 0 %
Eosinophils Absolute: 0.2 10*3/uL (ref 0.0–0.5)
Eosinophils Relative: 2 %
HCT: 23.2 % — ABNORMAL LOW (ref 39.0–52.0)
Hemoglobin: 7.7 g/dL — ABNORMAL LOW (ref 13.0–17.0)
Immature Granulocytes: 2 %
Lymphocytes Relative: 6 %
Lymphs Abs: 0.6 10*3/uL — ABNORMAL LOW (ref 0.7–4.0)
MCH: 31.7 pg (ref 26.0–34.0)
MCHC: 33.2 g/dL (ref 30.0–36.0)
MCV: 95.5 fL (ref 80.0–100.0)
Monocytes Absolute: 1.8 10*3/uL — ABNORMAL HIGH (ref 0.1–1.0)
Monocytes Relative: 17 %
Neutro Abs: 7.6 10*3/uL (ref 1.7–7.7)
Neutrophils Relative %: 73 %
Platelets: 201 10*3/uL (ref 150–400)
RBC: 2.43 MIL/uL — ABNORMAL LOW (ref 4.22–5.81)
RDW: 15 % (ref 11.5–15.5)
WBC: 10.3 10*3/uL (ref 4.0–10.5)
nRBC: 0.2 % (ref 0.0–0.2)

## 2021-01-09 LAB — BASIC METABOLIC PANEL
Anion gap: 6 (ref 5–15)
BUN: 25 mg/dL — ABNORMAL HIGH (ref 8–23)
CO2: 29 mmol/L (ref 22–32)
Calcium: 8.1 mg/dL — ABNORMAL LOW (ref 8.9–10.3)
Chloride: 102 mmol/L (ref 98–111)
Creatinine, Ser: 1.02 mg/dL (ref 0.61–1.24)
GFR, Estimated: >60 mL/min (ref >60)
Glucose, Bld: 96 mg/dL (ref 70–99)
Potassium: 3.5 mmol/L (ref 3.5–5.1)
Sodium: 137 mmol/L (ref 135–145)

## 2021-01-09 LAB — MAGNESIUM: Magnesium: 2.2 mg/dL (ref 1.7–2.4)

## 2021-01-09 LAB — PREPARE RBC (CROSSMATCH)

## 2021-01-09 MED ORDER — SODIUM CHLORIDE 0.9% IV SOLUTION: Freq: Once | INTRAVENOUS | Status: AC

## 2021-01-09 MED ORDER — CALCIUM CARBONATE ANTACID 500 MG PO CHEW: 400.0000 mg | CHEWABLE_TABLET | Freq: Two times a day (BID) | ORAL | Status: DC | PRN

## 2021-01-09 MED ORDER — FUROSEMIDE 10 MG/ML IJ SOLN
40.0000 mg | Freq: Once | INTRAMUSCULAR | Status: AC
  Administered 2021-01-09: 40 mg via INTRAVENOUS
  Filled 2021-01-09: qty 4

## 2021-01-09 MED ORDER — POTASSIUM CHLORIDE 20 MEQ PO PACK
40.0000 meq | PACK | Freq: Once | ORAL | Status: AC
  Administered 2021-01-09: 40 meq via ORAL
  Filled 2021-01-09 (×2): qty 2

## 2021-01-09 MED ORDER — QUETIAPINE FUMARATE 25 MG PO TABS
25.0000 mg | ORAL_TABLET | Freq: Every day | ORAL | Status: DC
  Administered 2021-01-09 – 2021-01-17 (×9): 25 mg via ORAL
  Filled 2021-01-09 (×9): qty 1

## 2021-01-09 NOTE — Progress Notes (Signed)
PROGRESS NOTE    Christopher [name]  FGH:[id number] DOB: [birth year] DOA: 01/03/2021 PCP: [name], MD   Chief complaint.  Confusion and agitation. Brief Narrative:  Christopher [name] is a 87 y.o. male with medical history significant for urothelial cancer of the bladder, history of atrial fibrillation on chronic anticoagulation therapy, hypertension, dyslipidemia who presents to the ER via EMS for evaluation of 2 witnessed syncopal episodes. Patient recently had cystoscopy with fulguration and cryosurgery for bladder cancer on 6/13.  He had a significant gross hematuria with a large amount of blood clots.  Evaluated by urology, started CBI. Cystoscopy and clot evacuation performed on 6/29. Developed rapid ventricular response with atrial fibrillation on 6/30.  Started on diltiazem drip, then transitioned to oral diltiazem.   Assessment & Plan:   Principal Problem:   Acute blood loss anemia Active Problems:   HTN (hypertension)   Benign prostatic hyperplasia with urinary frequency   Persistent atrial fibrillation (HCC)   Urothelial carcinoma of bladder (HCC)   Gross hematuria   Acute renal failure (ARF) ([place])   Acute hypoxemic respiratory failure ([place])   Delirium   1.  Delirium. Reviewed CT head, no acute changes, no significant atrophy. Patient was given increased dose of Seroquel last night, he slept through the whole night without any agitation.  This morning, he feels better.  I reduce the Seroquel to 25 mg again. Continue Haldol as needed.  #2.  Acute blood loss anemia. Hypovolemic shock, POA Iron deficient anemia. Gross hematuria secondary to bladder malignancy. Patient blood pressure has been normalized.  However, hemoglobin dropped down to 7.7 again today. I will give a unit of PRBC transfusion.  We will give 40 mg IV Lasix after transfusion.  3.  Acute hypoxemic respiratory failure. Acute on chronic diastolic congestive heart failure. Condition has  improved, continue wean off oxygen.  Given incentive spirometer today.  4.  Paroxysmal atrial fibrillation with rapid ventricular response. Transient hypotension. Condition has much improved, currently on oral diltiazem.  Appreciate cardiology consult.  5. Acute kidney injury secondary to obstruction. Hyponatremia. Hyperkalemia. Condition improved, potassium 3.5 today, will give oral 40 mEq potassium.  #6.  Generalized weakness Continue PT/OT.   DVT prophylaxis: SCDs (hold off Lovenox due to drop of hemoglobin). Code Status: Full Family Communication: Daughter updated Disposition Plan:    Status is: Inpatient  Remains inpatient appropriate because:IV treatments appropriate due to intensity of illness or inability to take PO and Inpatient level of care appropriate due to severity of illness  Dispo: The patient is from: Home              Anticipated d/c is to: SNF              Patient currently is not medically stable to d/c.   Difficult to place patient No        I/O last 3 completed shifts: In: 480 [P.O.:480] Out: 852 [Urine:851; Stool:1] No intake/output data recorded.     Consultants:  Cardiology  Procedures:   Antimicrobials: None  Subjective: Patient slept much better last night after giving Seroquel.  He still sleepy in the morning when I seen him.  He does not have any confusion or agitation anymore. Denies any short of breath or cough. No dysuria hematuria, urine still looks concentrated. No fever chills  He has a better appetite without nausea vomiting.  Last bowel movement 7/1.  Objective: Vitals:   01/08/21 1943 01/08/21 2000 01/09/21 0403 01/09/21 0804  BP: 127/63  111/64 120/66  Pulse: 82  72 82  Resp: (!) 21  16 17   Temp: 99.8 F (37.7 C)  98.2 F (36.8 C) 99.5 F (37.5 C)  TempSrc:      SpO2: 95% 97% 97% 98%  Weight:      Height:        Intake/Output Summary (Last 24 hours) at 01/09/2021 1038 Last data filed at 01/09/2021 0334 Gross  per 24 hour  Intake 240 ml  Output 802 ml  Net -562 ml   Filed Weights   01/03/21 1253 01/03/21 2312 01/06/21 0429  Weight: 88.5 kg 90 kg 89.5 kg    Examination:  General exam: Appears calm and comfortable  Respiratory system: Some mild crackles in the base.  Respiratory effort normal. Cardiovascular system: Irregular. No JVD, murmurs, rubs, gallops or clicks. No pedal edema. Gastrointestinal system: Abdomen is nondistended, soft and nontender. No organomegaly or masses felt. Normal bowel sounds heard. Central nervous system: Alert and oriented x3. No focal neurological deficits. Extremities: Symmetric 5 x 5 power. Skin: No rashes, lesions or ulcers Psychiatry: Judgement and insight appear normal. Mood & affect appropriate.     Data Reviewed: I have personally reviewed following labs and imaging studies  CBC: Recent Labs  Lab 01/03/21 1614 01/03/21 2321 01/06/21 0601 01/06/21 1724 01/07/21 0607 01/07/21 1702 01/08/21 0517 01/08/21 1708 01/09/21 0641  WBC 7.3  --  6.8  --  6.0  --  8.3  --  10.3  NEUTROABS  --   --  5.2  --  4.3  --  5.9  --  7.6  HGB 7.9*   < > 7.6*   < > 7.9* 8.4* 8.0* 8.7* 7.7*  HCT 23.6*   < > 22.8*   < > 23.3* 25.6* 24.4* 26.9* 23.2*  MCV 97.1  --  97.9  --  95.1  --  95.3  --  95.5  PLT 196  --  214  --  193  --  198  --  201   < > = values in this interval not displayed.   Basic Metabolic Panel: Recent Labs  Lab 01/05/21 0638 01/06/21 0601 01/07/21 0607 01/08/21 0517 01/09/21 0641  NA 135 136 136 138 137  K 5.1 5.1 4.1 3.6 3.5  CL 100 104 104 102 102  CO2 26 28 28 30 29   GLUCOSE 153* 126* 112* 103* 96  BUN 31* 43* 39* 32* 25*  CREATININE 2.42* 2.00* 1.32* 1.10 1.02  CALCIUM 8.5* 8.0* 8.3* 8.4* 8.1*  MG  --  2.2 2.4 2.2 2.2   GFR: Estimated Creatinine Clearance: 51.3 mL/min (by C-G formula based on SCr of 1.02 mg/dL). Liver Function Tests: No results for input(s): AST, ALT, ALKPHOS, BILITOT, PROT, ALBUMIN in the last 168  hours. No results for input(s): LIPASE, AMYLASE in the last 168 hours. No results for input(s): AMMONIA in the last 168 hours. Coagulation Profile: No results for input(s): INR, PROTIME in the last 168 hours. Cardiac Enzymes: No results for input(s): CKTOTAL, CKMB, CKMBINDEX, TROPONINI in the last 168 hours. BNP (last 3 results) No results for input(s): PROBNP in the last 8760 hours. HbA1C: No results for input(s): HGBA1C in the last 72 hours. CBG: No results for input(s): GLUCAP in the last 168 hours. Lipid Profile: No results for input(s): CHOL, HDL, LDLCALC, TRIG, CHOLHDL, LDLDIRECT in the last 72 hours. Thyroid Function Tests: No results for input(s): TSH, T4TOTAL, FREET4, T3FREE, THYROIDAB in the last 72 hours. Anemia Panel: No results  for input(s): VITAMINB12, FOLATE, FERRITIN, TIBC, IRON, RETICCTPCT in the last 72 hours. Sepsis Labs: No results for input(s): PROCALCITON, LATICACIDVEN in the last 168 hours.  Recent Results (from the past 240 hour(s))  Resp Panel by RT-PCR (Flu A&B, Covid) Nasopharyngeal Swab     Status: None   Collection Time: 01/03/21  3:22 PM   Specimen: Nasopharyngeal Swab; Nasopharyngeal(NP) swabs in vial transport medium  Result Value Ref Range Status   SARS Coronavirus 2 by RT PCR NEGATIVE NEGATIVE Final    Comment: (NOTE) SARS-CoV-2 target nucleic acids are NOT DETECTED.  The SARS-CoV-2 RNA is generally detectable in upper respiratory specimens during the acute phase of infection. The lowest concentration of SARS-CoV-2 viral copies this assay can detect is 138 copies/mL. A negative result does not preclude SARS-Cov-2 infection and should not be used as the sole basis for treatment or other patient management decisions. A negative result may occur with  improper specimen collection/handling, submission of specimen other than nasopharyngeal swab, presence of viral mutation(s) within the areas targeted by this assay, and inadequate number of  viral copies(<138 copies/mL). A negative result must be combined with clinical observations, patient history, and epidemiological information. The expected result is Negative.  Fact Sheet for Patients:  EntrepreneurPulse.com.au  Fact Sheet for Healthcare Providers:  IncredibleEmployment.be  This test is no t yet approved or cleared by the [place] FDA and  has been authorized for detection and/or diagnosis of SARS-CoV-2 by FDA under an Emergency Use Authorization (EUA). This EUA will remain  in effect (meaning this test can be used) for the duration of the COVID-19 declaration under Section 564(b)(1) of the Act, 21 U.S.C.section 360bbb-3(b)(1), unless the authorization is terminated  or revoked sooner.       Influenza A by PCR NEGATIVE NEGATIVE Final   Influenza B by PCR NEGATIVE NEGATIVE Final    Comment: (NOTE) The Xpert Xpress SARS-CoV-2/FLU/RSV plus assay is intended as an aid in the diagnosis of influenza from Nasopharyngeal swab specimens and should not be used as a sole basis for treatment. Nasal washings and aspirates are unacceptable for Xpert Xpress SARS-CoV-2/FLU/RSV testing.  Fact Sheet for Patients: EntrepreneurPulse.com.au  Fact Sheet for Healthcare Providers: IncredibleEmployment.be  This test is not yet approved or cleared by the [place] FDA and has been authorized for detection and/or diagnosis of SARS-CoV-2 by FDA under an Emergency Use Authorization (EUA). This EUA will remain in effect (meaning this test can be used) for the duration of the COVID-19 declaration under Section 564(b)(1) of the Act, 21 U.S.C. section 360bbb-3(b)(1), unless the authorization is terminated or revoked.  Performed at [facility], [address] [place], [place] [address]          Radiology Studies: CT HEAD WO CONTRAST  Result Date: 01/08/2021 CLINICAL DATA:  Delirium. EXAM:  CT HEAD WITHOUT CONTRAST TECHNIQUE: Contiguous axial images were obtained from the base of the skull through the vertex without intravenous contrast. COMPARISON:  February 07, 2019. FINDINGS: Brain: Mild chronic ischemic white matter disease is noted. No mass effect or midline shift is noted. Ventricular size is within normal limits. There is no evidence of mass lesion, hemorrhage or acute infarction. Vascular: No hyperdense vessel or unexpected calcification. Skull: Normal. Negative for fracture or focal lesion. Sinuses/Orbits: No acute finding. Other: None. IMPRESSION: No acute intracranial abnormality seen. Electronically Signed   By: Marijo Conception M.D.   On: 01/08/2021 14:31   ECHOCARDIOGRAM COMPLETE  Result Date: 01/07/2021    ECHOCARDIOGRAM REPORT  Patient Name:   Christopher [name] Date of Exam: 01/07/2021 Medical Rec #:  [id number]         Height:       71.0 in Accession #:    [id number]        Weight:       197.3 lb Date of Birth:  [birth year]         BSA:          2.096 m Patient Age:    89 years          BP:           111/83 mmHg Patient Gender: M                 HR:           65 bpm. Exam Location:  [facility] Procedure: 2D Echo, Cardiac Doppler and Color Doppler Indications:     CHF-acute diastolic O29.47  History:         Patient has prior history of Echocardiogram examinations, most                  recent 03/01/2015. Pacemaker, Arrythmias:Atrial Fibrillation;                  Risk Factors:Dyslipidemia.  Sonographer:     Sherrie Sport RDCS (AE) Referring Phys:  [id number] Sharen Hones Diagnosing Phys: Bartholome Bill MD  Sonographer Comments: Technically challenging study due to limited acoustic windows, suboptimal apical window and suboptimal subcostal window. IMPRESSIONS  1. Left ventricular ejection fraction, by estimation, is 70 to 75%. The left ventricle has hyperdynamic function. The left ventricle has no regional wall motion abnormalities. There is mild left ventricular hypertrophy. Left ventricular diastolic  parameters are consistent with Grade I diastolic dysfunction (impaired relaxation).  2. Right ventricular systolic function is normal. The right ventricular size is normal.  3. Right atrial size was mildly dilated.  4. The mitral valve is grossly normal. Trivial mitral valve regurgitation.  5. The aortic valve is grossly normal. Aortic valve regurgitation is trivial. Mild to moderate aortic valve sclerosis/calcification is present, without any evidence of aortic stenosis. FINDINGS  Left Ventricle: Left ventricular ejection fraction, by estimation, is 70 to 75%. The left ventricle has hyperdynamic function. The left ventricle has no regional wall motion abnormalities. The left ventricular internal cavity size was normal in size. There is mild left ventricular hypertrophy. Left ventricular diastolic parameters are consistent with Grade I diastolic dysfunction (impaired relaxation). Right Ventricle: The right ventricular size is normal. No increase in right ventricular wall thickness. Right ventricular systolic function is normal. Left Atrium: Left atrial size was normal in size. Right Atrium: Right atrial size was mildly dilated. Pericardium: There is no evidence of pericardial effusion. Mitral Valve: The mitral valve is grossly normal. Trivial mitral valve regurgitation. Tricuspid Valve: The tricuspid valve is grossly normal. Tricuspid valve regurgitation is trivial. Aortic Valve: The aortic valve is grossly normal. Aortic valve regurgitation is trivial. Mild to moderate aortic valve sclerosis/calcification is present, without any evidence of aortic stenosis. Aortic valve mean gradient measures 11.7 mmHg. Aortic valve peak gradient measures 21.6 mmHg. Aortic valve area, by VTI measures 1.57 cm. Pulmonic Valve: The pulmonic valve was not well visualized. Pulmonic valve regurgitation is not visualized. Aorta: The aortic root is normal in size and structure. IAS/Shunts: The atrial septum is grossly normal.  LEFT  VENTRICLE PLAX 2D LVIDd:         3.71 cm  Diastology LVIDs:  2.03 cm  LV e' medial:    5.33 cm/s LV PW:         1.34 cm  LV E/e' medial:  21.6 LV IVS:        1.45 cm  LV e' lateral:   9.68 cm/s LVOT diam:     2.00 cm  LV E/e' lateral: 11.9 LV SV:         63 LV SV Index:   30 LVOT Area:     3.14 cm  RIGHT VENTRICLE RV S prime:     16.40 cm/s TAPSE (M-mode): 4.8 cm LEFT ATRIUM              Index       RIGHT ATRIUM           Index LA diam:        3.60 cm  1.72 cm/m  RA Area:     27.40 cm LA Vol (A2C):   74.0 ml  35.30 ml/m RA Volume:   85.00 ml  40.54 ml/m LA Vol (A4C):   119.0 ml 56.76 ml/m LA Biplane Vol: 103.0 ml 49.13 ml/m  AORTIC VALVE                    PULMONIC VALVE AV Area (Vmax):    1.66 cm     PV Vmax:        0.93 m/s AV Area (Vmean):   1.53 cm     PV Peak grad:   3.5 mmHg AV Area (VTI):     1.57 cm     RVOT Peak grad: 3 mmHg AV Vmax:           232.33 cm/s AV Vmean:          161.333 cm/s AV VTI:            0.397 m AV Peak Grad:      21.6 mmHg AV Mean Grad:      11.7 mmHg LVOT Vmax:         123.00 cm/s LVOT Vmean:        78.500 cm/s LVOT VTI:          0.199 m LVOT/AV VTI ratio: 0.50  AORTA Ao Root diam: 3.50 cm MITRAL VALVE                TRICUSPID VALVE MV Area (PHT): 3.54 cm     TR Peak grad:   18.8 mmHg MV Decel Time: 214 msec     TR Vmax:        217.00 cm/s MV E velocity: 115.00 cm/s MV A velocity: 68.60 cm/s   SHUNTS MV E/A ratio:  1.68         Systemic VTI:  0.20 m                             Systemic Diam: 2.00 cm Bartholome Bill MD Electronically signed by Bartholome Bill MD Signature Date/Time: 01/07/2021/3:19:25 PM    Final         Scheduled Meds:  sodium chloride   Intravenous Once   alfuzosin  10 mg Oral Q breakfast   atorvastatin  10 mg Oral Daily   calcium carbonate  500 mg of elemental calcium Oral Q breakfast   cholecalciferol  2,000 Units Oral q AM   diltiazem  30 mg Oral Q6H   enoxaparin (LOVENOX) injection  40 mg Subcutaneous Q24H   ferrous sulfate  325 mg Oral Q  breakfast   finasteride  5 mg Oral Daily   furosemide  40 mg Intravenous Once   multivitamin with minerals  1 tablet Oral q AM   QUEtiapine  25 mg Oral QHS   tamsulosin  0.4 mg Oral Daily   Continuous Infusions:   LOS: 6 days    Time spent: 34 minutes    Sharen Hones, MD [facility]   To contact the attending provider between 7A-7P or the covering provider during after hours 7P-7A, please log into the web site [email or web address] and access using universal [place] password for that web site. If you do not have the password, please call the hospital operator.  01/09/2021, 10:38 AM

## 2021-01-09 NOTE — Progress Notes (Signed)
Patient Name: Christopher [name] Date of Encounter: 01/09/2021  Hospital Problem List     Principal Problem:   Acute blood loss anemia Active Problems:   HTN (hypertension)   Benign prostatic hyperplasia with urinary frequency   Persistent atrial fibrillation (HCC)   Urothelial carcinoma of bladder (HCC)   Gross hematuria   Acute renal failure (ARF) (HCC)   Acute hypoxemic respiratory failure [facility])   Delirium    Patient Profile         87 y.o. male with history of urethral carcinoma of the bladder, paroxysmal atrial fibrillation on chronic anticoagulation therapy, hypertension and hyperlipidemia who is status post cystoscopy with fulguration and cryosurgery on December 20, 2020.  He was discharged to home with a Foley that was removed 2 days later without any problems.  1 day prior to admission on January 03, 2021 he had hematuria and multiple clots per his bladder.  Had 2 syncopal episodes while upright.  He was brought to the emergency room where he was noted to be significantly hypotensive with gross hematuria.  Laboratories revealed creatinine of 1.07.  Hemoglobin was 8.9 down from 13.11-week prior.  Twelve-lead EKG showed sinus rhythm.  He was seen by urology and was taken off of Eliquis which he was on for his A. fib.  On 01/05/2021 underwent cystoscopy with clot evacuation and liberation.  He developed atrial fibrillation with rapid ventricular response and was placed on a diltiazem drip.  His hemodynamics remained stable.  He was somewhat confused.  Consultation called regarding A. fib with RVR.  Patient is currently on diltiazem drip.  He 1 dose of IV digoxin.  He is currently in atrial fibrillation with controlled ventricular response with ventricular rates between the mid 80s to low 100s.    Subjective   Slept better last night however somewhat groggy this morning but alert and oriented x3.  Feels weak and fatigued with lower extremity weakness.  Discussed patient's progress and  treatment plan with 2 daughters and patient.  Inpatient Medications     sodium chloride   Intravenous Once   alfuzosin  10 mg Oral Q breakfast   atorvastatin  10 mg Oral Daily   calcium carbonate  500 mg of elemental calcium Oral Q breakfast   cholecalciferol  2,000 Units Oral q AM   diltiazem  30 mg Oral Q6H   ferrous sulfate  325 mg Oral Q breakfast   finasteride  5 mg Oral Daily   furosemide  40 mg Intravenous Once   multivitamin with minerals  1 tablet Oral q AM   QUEtiapine  25 mg Oral QHS   tamsulosin  0.4 mg Oral Daily    Vital Signs    Vitals:   01/08/21 1943 01/08/21 2000 01/09/21 0403 01/09/21 0804  BP: 127/63  111/64 120/66  Pulse: 82  72 82  Resp: (!) 21  16 17   Temp: 99.8 F (37.7 C)  98.2 F (36.8 C) 99.5 F (37.5 C)  TempSrc:      SpO2: 95% 97% 97% 98%  Weight:      Height:        Intake/Output Summary (Last 24 hours) at 01/09/2021 1045 Last data filed at 01/09/2021 0334 Gross per 24 hour  Intake 240 ml  Output 802 ml  Net -562 ml   Filed Weights   01/03/21 1253 01/03/21 2312 01/06/21 0429  Weight: 88.5 kg 90 kg 89.5 kg    Physical Exam    GEN: Chronically ill-appearing HEENT:  normal.  Neck: Supple, no JVD, carotid bruits, or masses. Cardiac: Irregular irregular Respiratory:  Respirations regular and unlabored, clear to auscultation bilaterally. GI: Soft, nontender, nondistended, BS + x 4. MS: no deformity or atrophy. Skin: warm and dry, no rash. Neuro:  Strength and sensation are intact. Psych: Normal affect.  Labs    CBC Recent Labs    01/08/21 0517 01/08/21 1708 01/09/21 0641  WBC 8.3  --  10.3  NEUTROABS 5.9  --  7.6  HGB 8.0* 8.7* 7.7*  HCT 24.4* 26.9* 23.2*  MCV 95.3  --  95.5  PLT 198  --  622   Basic Metabolic Panel Recent Labs    01/08/21 0517 01/09/21 0641  NA 138 137  K 3.6 3.5  CL 102 102  CO2 30 29  GLUCOSE 103* 96  BUN 32* 25*  CREATININE 1.10 1.02  CALCIUM 8.4* 8.1*  MG 2.2 2.2   Liver Function  Tests No results for input(s): AST, ALT, ALKPHOS, BILITOT, PROT, ALBUMIN in the last 72 hours. No results for input(s): LIPASE, AMYLASE in the last 72 hours. Cardiac Enzymes No results for input(s): CKTOTAL, CKMB, CKMBINDEX, TROPONINI in the last 72 hours. BNP No results for input(s): BNP in the last 72 hours. D-Dimer No results for input(s): DDIMER in the last 72 hours. Hemoglobin A1C No results for input(s): HGBA1C in the last 72 hours. Fasting Lipid Panel No results for input(s): CHOL, HDL, LDLCALC, TRIG, CHOLHDL, LDLDIRECT in the last 72 hours. Thyroid Function Tests No results for input(s): TSH, T4TOTAL, T3FREE, THYROIDAB in the last 72 hours.  Invalid input(s): FREET3  Telemetry    A. fib with variable ventricular response  ECG    Atrial fibrillation with no ischemia  Radiology    CT HEAD WO CONTRAST  Result Date: 01/08/2021 CLINICAL DATA:  Delirium. EXAM: CT HEAD WITHOUT CONTRAST TECHNIQUE: Contiguous axial images were obtained from the base of the skull through the vertex without intravenous contrast. COMPARISON:  February 07, 2019. FINDINGS: Brain: Mild chronic ischemic white matter disease is noted. No mass effect or midline shift is noted. Ventricular size is within normal limits. There is no evidence of mass lesion, hemorrhage or acute infarction. Vascular: No hyperdense vessel or unexpected calcification. Skull: Normal. Negative for fracture or focal lesion. Sinuses/Orbits: No acute finding. Other: None. IMPRESSION: No acute intracranial abnormality seen. Electronically Signed   By: Marijo Conception M.D.   On: 01/08/2021 14:31   DG OR UROLOGY CYSTO IMAGE ([facility] ONLY)  Result Date: 01/05/2021 There is no interpretation for this exam.  This order is for images obtained during a surgical procedure.  Please See "Surgeries" Tab for more information regarding the procedure.   ECHOCARDIOGRAM COMPLETE  Result Date: 01/07/2021    ECHOCARDIOGRAM REPORT   Patient Name:   Christopher [name] Date of Exam: 01/07/2021 Medical Rec #:  [id number]         Height:       71.0 in Accession #:    [id number]        Weight:       197.3 lb Date of Birth:  09-18-1927         BSA:          2.096 m Patient Age:    21 years          BP:           111/83 mmHg Patient Gender: M  HR:           65 bpm. Exam Location:  [facility] Procedure: 2D Echo, Cardiac Doppler and Color Doppler Indications:     CHF-acute diastolic D32.20  History:         Patient has prior history of Echocardiogram examinations, most                  recent 03/01/2015. Pacemaker, Arrythmias:Atrial Fibrillation;                  Risk Factors:Dyslipidemia.  Sonographer:     Sherrie Sport RDCS (AE) Referring Phys:  [id number] Sharen Hones Diagnosing Phys: Bartholome Bill MD  Sonographer Comments: Technically challenging study due to limited acoustic windows, suboptimal apical window and suboptimal subcostal window. IMPRESSIONS  1. Left ventricular ejection fraction, by estimation, is 70 to 75%. The left ventricle has hyperdynamic function. The left ventricle has no regional wall motion abnormalities. There is mild left ventricular hypertrophy. Left ventricular diastolic parameters are consistent with Grade I diastolic dysfunction (impaired relaxation).  2. Right ventricular systolic function is normal. The right ventricular size is normal.  3. Right atrial size was mildly dilated.  4. The mitral valve is grossly normal. Trivial mitral valve regurgitation.  5. The aortic valve is grossly normal. Aortic valve regurgitation is trivial. Mild to moderate aortic valve sclerosis/calcification is present, without any evidence of aortic stenosis. FINDINGS  Left Ventricle: Left ventricular ejection fraction, by estimation, is 70 to 75%. The left ventricle has hyperdynamic function. The left ventricle has no regional wall motion abnormalities. The left ventricular internal cavity size was normal in size. There is mild left ventricular hypertrophy. Left  ventricular diastolic parameters are consistent with Grade I diastolic dysfunction (impaired relaxation). Right Ventricle: The right ventricular size is normal. No increase in right ventricular wall thickness. Right ventricular systolic function is normal. Left Atrium: Left atrial size was normal in size. Right Atrium: Right atrial size was mildly dilated. Pericardium: There is no evidence of pericardial effusion. Mitral Valve: The mitral valve is grossly normal. Trivial mitral valve regurgitation. Tricuspid Valve: The tricuspid valve is grossly normal. Tricuspid valve regurgitation is trivial. Aortic Valve: The aortic valve is grossly normal. Aortic valve regurgitation is trivial. Mild to moderate aortic valve sclerosis/calcification is present, without any evidence of aortic stenosis. Aortic valve mean gradient measures 11.7 mmHg. Aortic valve peak gradient measures 21.6 mmHg. Aortic valve area, by VTI measures 1.57 cm. Pulmonic Valve: The pulmonic valve was not well visualized. Pulmonic valve regurgitation is not visualized. Aorta: The aortic root is normal in size and structure. IAS/Shunts: The atrial septum is grossly normal.  LEFT VENTRICLE PLAX 2D LVIDd:         3.71 cm  Diastology LVIDs:         2.03 cm  LV e' medial:    5.33 cm/s LV PW:         1.34 cm  LV E/e' medial:  21.6 LV IVS:        1.45 cm  LV e' lateral:   9.68 cm/s LVOT diam:     2.00 cm  LV E/e' lateral: 11.9 LV SV:         63 LV SV Index:   30 LVOT Area:     3.14 cm  RIGHT VENTRICLE RV S prime:     16.40 cm/s TAPSE (M-mode): 4.8 cm LEFT ATRIUM              Index  RIGHT ATRIUM           Index LA diam:        3.60 cm  1.72 cm/m  RA Area:     27.40 cm LA Vol (A2C):   74.0 ml  35.30 ml/m RA Volume:   85.00 ml  40.54 ml/m LA Vol (A4C):   119.0 ml 56.76 ml/m LA Biplane Vol: 103.0 ml 49.13 ml/m  AORTIC VALVE                    PULMONIC VALVE AV Area (Vmax):    1.66 cm     PV Vmax:        0.93 m/s AV Area (Vmean):   1.53 cm     PV Peak  grad:   3.5 mmHg AV Area (VTI):     1.57 cm     RVOT Peak grad: 3 mmHg AV Vmax:           232.33 cm/s AV Vmean:          161.333 cm/s AV VTI:            0.397 m AV Peak Grad:      21.6 mmHg AV Mean Grad:      11.7 mmHg LVOT Vmax:         123.00 cm/s LVOT Vmean:        78.500 cm/s LVOT VTI:          0.199 m LVOT/AV VTI ratio: 0.50  AORTA Ao Root diam: 3.50 cm MITRAL VALVE                TRICUSPID VALVE MV Area (PHT): 3.54 cm     TR Peak grad:   18.8 mmHg MV Decel Time: 214 msec     TR Vmax:        217.00 cm/s MV E velocity: 115.00 cm/s MV A velocity: 68.60 cm/s   SHUNTS MV E/A ratio:  1.68         Systemic VTI:  0.20 m                             Systemic Diam: 2.00 cm Bartholome Bill MD Electronically signed by Bartholome Bill MD Signature Date/Time: 01/07/2021/3:19:25 PM    Final     Assessment & Plan    87 year old male with history of paroxysmal A. fib, history of bladder carcinoma who was admitted with hematuria and penile pain post cystoscopy.  Underwent urologic evaluation.  He was taken off of Eliquis.  Has a history of paroxysmal atrial fibrillation.  Was in sinus rhythm on admission but developed A. fib with RVR. Blood pressure little soft but currently hemodynamically stable.  1.  A. fib-had A. fib with variable ventricular response.  He is on Eliquis as an outpatient.  This was held due to his urologic procedures and hematuria.  Continues with 30 mg Cardizem p.o. every 6.  Rate is fairly stable on this.  Does increase somewhat during his agitated periods.  Would continue with metoprolol as well..  We will continue to remain off Eliquis for now given continued GI blood loss.  We will follow rate.  2.  Bladder carcinoma-being followed by urology.  Appreciate their input.  We will continue to hold Eliquis.  3.  Confusion-agitated this morning.  He is however able to hold correctly state his location and date.  Some of this appears to be situational due to  sundowning.  Is nonfocal.  4.  Anemia-likely  secondary to hematuria.  Being transfused 1 unit today.  5.  Hypotension-stable at present.  Signed, Javier Docker Rexanne Inocencio MD 01/09/2021, 10:45 AM  Pager: [phone]

## 2021-01-10 DIAGNOSIS — R410 Disorientation, unspecified: Secondary | ICD-10-CM | POA: Diagnosis not present

## 2021-01-10 DIAGNOSIS — J9601 Acute respiratory failure with hypoxia: Secondary | ICD-10-CM | POA: Diagnosis not present

## 2021-01-10 DIAGNOSIS — D62 Acute posthemorrhagic anemia: Secondary | ICD-10-CM | POA: Diagnosis not present

## 2021-01-10 DIAGNOSIS — N178 Other acute kidney failure: Secondary | ICD-10-CM | POA: Diagnosis not present

## 2021-01-10 LAB — BPAM RBC
Blood Product Expiration Date: 202208022359
ISSUE DATE / TIME: 202207031309
Unit Type and Rh: 6200

## 2021-01-10 LAB — CBC WITH DIFFERENTIAL/PLATELET
Abs Immature Granulocytes: 0.22 10*3/uL — ABNORMAL HIGH (ref 0.00–0.07)
Basophils Absolute: 0.1 10*3/uL (ref 0.0–0.1)
Basophils Relative: 0 %
Eosinophils Absolute: 0.3 10*3/uL (ref 0.0–0.5)
Eosinophils Relative: 2 %
HCT: 29 % — ABNORMAL LOW (ref 39.0–52.0)
Hemoglobin: 9.6 g/dL — ABNORMAL LOW (ref 13.0–17.0)
Immature Granulocytes: 2 %
Lymphocytes Relative: 4 %
Lymphs Abs: 0.5 10*3/uL — ABNORMAL LOW (ref 0.7–4.0)
MCH: 31.4 pg (ref 26.0–34.0)
MCHC: 33.1 g/dL (ref 30.0–36.0)
MCV: 94.8 fL (ref 80.0–100.0)
Monocytes Absolute: 2 10*3/uL — ABNORMAL HIGH (ref 0.1–1.0)
Monocytes Relative: 15 %
Neutro Abs: 10.6 10*3/uL — ABNORMAL HIGH (ref 1.7–7.7)
Neutrophils Relative %: 77 %
Platelets: 222 10*3/uL (ref 150–400)
RBC: 3.06 MIL/uL — ABNORMAL LOW (ref 4.22–5.81)
RDW: 15 % (ref 11.5–15.5)
WBC: 13.7 10*3/uL — ABNORMAL HIGH (ref 4.0–10.5)
nRBC: 0 % (ref 0.0–0.2)

## 2021-01-10 LAB — MAGNESIUM: Magnesium: 2.3 mg/dL (ref 1.7–2.4)

## 2021-01-10 LAB — BASIC METABOLIC PANEL
Anion gap: 7 (ref 5–15)
BUN: 24 mg/dL — ABNORMAL HIGH (ref 8–23)
CO2: 29 mmol/L (ref 22–32)
Calcium: 8 mg/dL — ABNORMAL LOW (ref 8.9–10.3)
Chloride: 103 mmol/L (ref 98–111)
Creatinine, Ser: 1.07 mg/dL (ref 0.61–1.24)
GFR, Estimated: >60 mL/min (ref >60)
Glucose, Bld: 110 mg/dL — ABNORMAL HIGH (ref 70–99)
Potassium: 3.8 mmol/L (ref 3.5–5.1)
Sodium: 139 mmol/L (ref 135–145)

## 2021-01-10 LAB — TYPE AND SCREEN
ABO/RH(D): A POS
Antibody Screen: NEGATIVE
Unit division: 0

## 2021-01-10 LAB — PHOSPHORUS: Phosphorus: 2.9 mg/dL (ref 2.5–4.6)

## 2021-01-10 MED ORDER — ENOXAPARIN SODIUM 40 MG/0.4ML IJ SOSY
40.0000 mg | PREFILLED_SYRINGE | INTRAMUSCULAR | Status: DC
  Administered 2021-01-10 – 2021-01-19 (×10): 40 mg via SUBCUTANEOUS
  Filled 2021-01-10 (×10): qty 0.4

## 2021-01-10 NOTE — Progress Notes (Signed)
Patient Name: Christopher [name] Date of Encounter: 01/10/2021  Hospital Problem List     Principal Problem:   Acute blood loss anemia Active Problems:   HTN (hypertension)   Benign prostatic hyperplasia with urinary frequency   Persistent atrial fibrillation (HCC)   Urothelial carcinoma of bladder (HCC)   Gross hematuria   Acute renal failure (ARF) (HCC)   Acute hypoxemic respiratory failure [facility])   Delirium    Patient Profile         87 y.o. male with history of urethral carcinoma of the bladder, paroxysmal atrial fibrillation on chronic anticoagulation therapy, hypertension and hyperlipidemia who is status post cystoscopy with fulguration and cryosurgery on December 20, 2020.  He was discharged to home with a Foley that was removed 2 days later without any problems.  1 day prior to admission on January 03, 2021 he had hematuria and multiple clots per his bladder.  Had 2 syncopal episodes while upright.  He was brought to the emergency room where he was noted to be significantly hypotensive with gross hematuria.  Laboratories revealed creatinine of 1.07.  Hemoglobin was 8.9 down from 13.11-week prior.  Twelve-lead EKG showed sinus rhythm.  He was seen by urology and was taken off of Eliquis which he was on for his A. fib.  On 01/05/2021 underwent cystoscopy with clot evacuation and liberation.  He developed atrial fibrillation with rapid ventricular response and was placed on a diltiazem drip.  His hemodynamics remained stable.  He was somewhat confused.  Consultation called regarding A. fib with RVR.  Patient is currently on diltiazem drip.  He 1 dose of IV digoxin.  He is currently in atrial fibrillation with controlled ventricular response with ventricular rates between the mid 80s to low 100s.    Subjective   Feeling better this morning.  Less confused.  Had a better night sleep.  Inpatient Medications     alfuzosin  10 mg Oral Q breakfast   atorvastatin  10 mg Oral Daily   calcium  carbonate  500 mg of elemental calcium Oral Q breakfast   cholecalciferol  2,000 Units Oral q AM   diltiazem  30 mg Oral Q6H   enoxaparin (LOVENOX) injection  40 mg Subcutaneous Q24H   ferrous sulfate  325 mg Oral Q breakfast   finasteride  5 mg Oral Daily   multivitamin with minerals  1 tablet Oral q AM   QUEtiapine  25 mg Oral QHS   tamsulosin  0.4 mg Oral Daily    Vital Signs    Vitals:   01/09/21 1957 01/10/21 0345 01/10/21 0712 01/10/21 1135  BP: (!) 127/50 (!) 130/56 (!) 119/53 (!) 135/56  Pulse: 92 69 96 72  Resp:   20   Temp: 98.2 F (36.8 C) 98.4 F (36.9 C) 98.1 F (36.7 C) 98.7 F (37.1 C)  TempSrc:   Oral Oral  SpO2: 98% 97% 98% 91%  Weight:      Height:        Intake/Output Summary (Last 24 hours) at 01/10/2021 1227 Last data filed at 01/10/2021 0534 Gross per 24 hour  Intake 815 ml  Output 1900 ml  Net -1085 ml   Filed Weights   01/03/21 1253 01/03/21 2312 01/06/21 0429  Weight: 88.5 kg 90 kg 89.5 kg    Physical Exam    GEN: Well nourished, well developed, in no acute distress.  HEENT: normal.  Neck: Supple, no JVD, carotid bruits, or masses. Cardiac: Irregular irregular Respiratory:  Respirations regular and unlabored, clear to auscultation bilaterally. GI: Soft, nontender, nondistended, BS + x 4. MS: no deformity or atrophy. Skin: warm and dry, no rash. Neuro:  Strength and sensation are intact. Psych: Normal affect.  Labs    CBC Recent Labs    01/09/21 0641 01/10/21 0602  WBC 10.3 13.7*  NEUTROABS 7.6 10.6*  HGB 7.7* 9.6*  HCT 23.2* 29.0*  MCV 95.5 94.8  PLT 201 195   Basic Metabolic Panel Recent Labs    01/09/21 0641 01/10/21 0602  NA 137 139  K 3.5 3.8  CL 102 103  CO2 29 29  GLUCOSE 96 110*  BUN 25* 24*  CREATININE 1.02 1.07  CALCIUM 8.1* 8.0*  MG 2.2 2.3  PHOS  --  2.9   Liver Function Tests No results for input(s): AST, ALT, ALKPHOS, BILITOT, PROT, ALBUMIN in the last 72 hours. No results for input(s): LIPASE,  AMYLASE in the last 72 hours. Cardiac Enzymes No results for input(s): CKTOTAL, CKMB, CKMBINDEX, TROPONINI in the last 72 hours. BNP No results for input(s): BNP in the last 72 hours. D-Dimer No results for input(s): DDIMER in the last 72 hours. Hemoglobin A1C No results for input(s): HGBA1C in the last 72 hours. Fasting Lipid Panel No results for input(s): CHOL, HDL, LDLCALC, TRIG, CHOLHDL, LDLDIRECT in the last 72 hours. Thyroid Function Tests No results for input(s): TSH, T4TOTAL, T3FREE, THYROIDAB in the last 72 hours.  Invalid input(s): FREET3  Telemetry    Atrial fibrillation with controlled ventricular response  ECG       Radiology    CT HEAD WO CONTRAST  Result Date: 01/08/2021 CLINICAL DATA:  Delirium. EXAM: CT HEAD WITHOUT CONTRAST TECHNIQUE: Contiguous axial images were obtained from the base of the skull through the vertex without intravenous contrast. COMPARISON:  February 07, 2019. FINDINGS: Brain: Mild chronic ischemic white matter disease is noted. No mass effect or midline shift is noted. Ventricular size is within normal limits. There is no evidence of mass lesion, hemorrhage or acute infarction. Vascular: No hyperdense vessel or unexpected calcification. Skull: Normal. Negative for fracture or focal lesion. Sinuses/Orbits: No acute finding. Other: None. IMPRESSION: No acute intracranial abnormality seen. Electronically Signed   By: Marijo Conception M.D.   On: 01/08/2021 14:31   DG OR UROLOGY CYSTO IMAGE ([facility] ONLY)  Result Date: 01/05/2021 There is no interpretation for this exam.  This order is for images obtained during a surgical procedure.  Please See "Surgeries" Tab for more information regarding the procedure.   ECHOCARDIOGRAM COMPLETE  Result Date: 01/07/2021    ECHOCARDIOGRAM REPORT   Patient Name:   Christopher [name] Date of Exam: 01/07/2021 Medical Rec #:  [id number]         Height:       71.0 in Accession #:    [id number]        Weight:       197.3 lb Date of  Birth:  [birth year]         BSA:          2.096 m Patient Age:    20 years          BP:           111/83 mmHg Patient Gender: M                 HR:           65 bpm. Exam Location:  [facility] Procedure: 2D Echo, Cardiac Doppler and  Color Doppler Indications:     CHF-acute diastolic R91.63  History:         Patient has prior history of Echocardiogram examinations, most                  recent 03/01/2015. Pacemaker, Arrythmias:Atrial Fibrillation;                  Risk Factors:Dyslipidemia.  Sonographer:     Sherrie Sport RDCS (AE) Referring Phys:  [id number] Sharen Hones Diagnosing Phys: Bartholome Bill MD  Sonographer Comments: Technically challenging study due to limited acoustic windows, suboptimal apical window and suboptimal subcostal window. IMPRESSIONS  1. Left ventricular ejection fraction, by estimation, is 70 to 75%. The left ventricle has hyperdynamic function. The left ventricle has no regional wall motion abnormalities. There is mild left ventricular hypertrophy. Left ventricular diastolic parameters are consistent with Grade I diastolic dysfunction (impaired relaxation).  2. Right ventricular systolic function is normal. The right ventricular size is normal.  3. Right atrial size was mildly dilated.  4. The mitral valve is grossly normal. Trivial mitral valve regurgitation.  5. The aortic valve is grossly normal. Aortic valve regurgitation is trivial. Mild to moderate aortic valve sclerosis/calcification is present, without any evidence of aortic stenosis. FINDINGS  Left Ventricle: Left ventricular ejection fraction, by estimation, is 70 to 75%. The left ventricle has hyperdynamic function. The left ventricle has no regional wall motion abnormalities. The left ventricular internal cavity size was normal in size. There is mild left ventricular hypertrophy. Left ventricular diastolic parameters are consistent with Grade I diastolic dysfunction (impaired relaxation). Right Ventricle: The right ventricular size is normal.  No increase in right ventricular wall thickness. Right ventricular systolic function is normal. Left Atrium: Left atrial size was normal in size. Right Atrium: Right atrial size was mildly dilated. Pericardium: There is no evidence of pericardial effusion. Mitral Valve: The mitral valve is grossly normal. Trivial mitral valve regurgitation. Tricuspid Valve: The tricuspid valve is grossly normal. Tricuspid valve regurgitation is trivial. Aortic Valve: The aortic valve is grossly normal. Aortic valve regurgitation is trivial. Mild to moderate aortic valve sclerosis/calcification is present, without any evidence of aortic stenosis. Aortic valve mean gradient measures 11.7 mmHg. Aortic valve peak gradient measures 21.6 mmHg. Aortic valve area, by VTI measures 1.57 cm. Pulmonic Valve: The pulmonic valve was not well visualized. Pulmonic valve regurgitation is not visualized. Aorta: The aortic root is normal in size and structure. IAS/Shunts: The atrial septum is grossly normal.  LEFT VENTRICLE PLAX 2D LVIDd:         3.71 cm  Diastology LVIDs:         2.03 cm  LV e' medial:    5.33 cm/s LV PW:         1.34 cm  LV E/e' medial:  21.6 LV IVS:        1.45 cm  LV e' lateral:   9.68 cm/s LVOT diam:     2.00 cm  LV E/e' lateral: 11.9 LV SV:         63 LV SV Index:   30 LVOT Area:     3.14 cm  RIGHT VENTRICLE RV S prime:     16.40 cm/s TAPSE (M-mode): 4.8 cm LEFT ATRIUM              Index       RIGHT ATRIUM           Index LA diam:  3.60 cm  1.72 cm/m  RA Area:     27.40 cm LA Vol (A2C):   74.0 ml  35.30 ml/m RA Volume:   85.00 ml  40.54 ml/m LA Vol (A4C):   119.0 ml 56.76 ml/m LA Biplane Vol: 103.0 ml 49.13 ml/m  AORTIC VALVE                    PULMONIC VALVE AV Area (Vmax):    1.66 cm     PV Vmax:        0.93 m/s AV Area (Vmean):   1.53 cm     PV Peak grad:   3.5 mmHg AV Area (VTI):     1.57 cm     RVOT Peak grad: 3 mmHg AV Vmax:           232.33 cm/s AV Vmean:          161.333 cm/s AV VTI:            0.397 m  AV Peak Grad:      21.6 mmHg AV Mean Grad:      11.7 mmHg LVOT Vmax:         123.00 cm/s LVOT Vmean:        78.500 cm/s LVOT VTI:          0.199 m LVOT/AV VTI ratio: 0.50  AORTA Ao Root diam: 3.50 cm MITRAL VALVE                TRICUSPID VALVE MV Area (PHT): 3.54 cm     TR Peak grad:   18.8 mmHg MV Decel Time: 214 msec     TR Vmax:        217.00 cm/s MV E velocity: 115.00 cm/s MV A velocity: 68.60 cm/s   SHUNTS MV E/A ratio:  1.68         Systemic VTI:  0.20 m                             Systemic Diam: 2.00 cm Bartholome Bill MD Electronically signed by Bartholome Bill MD Signature Date/Time: 01/07/2021/3:19:25 PM    Final     Assessment & Plan     87 year old male with history of paroxysmal A. fib, history of bladder carcinoma who was admitted with hematuria and penile pain post cystoscopy.  Underwent urologic evaluation.  He was taken off of Eliquis.  Has a history of paroxysmal atrial fibrillation.  Was in sinus rhythm on admission but developed A. fib with RVR. Blood pressure little soft but currently hemodynamically stable.  1.  A. fib-had A. fib with variable ventricular response.  He is on Eliquis as an outpatient.  This was held due to his urologic procedures and hematuria.  Continues with 30 mg Cardizem p.o. every 6.  Rate is fairly stable on this.  Does increase somewhat during his agitated periods.  Would continue with metoprolol as well..  We will continue to remain off Eliquis for now given continued GI blood loss.  Rate is better.  More alert today.  2.  Bladder carcinoma-being followed by urology.  Appreciate their input.  We will continue to hold Eliquis.  3.  Confusion-agitated this morning.  He is however able to hold correctly state his location and date.  Some of this appears to be situational due to sundowning.  Is nonfocal.  4.  Anemia-likely secondary to hematuria.  S/p transfusion  5.  Hypotension-stable  at present.  Signed, Javier Docker Kya Mayfield MD 01/10/2021, 12:27 PM  Pager: [phone]

## 2021-01-10 NOTE — Plan of Care (Signed)
  Problem: Education: Goal: Knowledge of General Education information will improve Description: Including pain rating scale, medication(s)/side effects and non-pharmacologic comfort measures 01/10/2021 1111 by Cristela Blue, RN Outcome: Progressing 01/10/2021 1111 by Cristela Blue, RN Outcome: Progressing   Problem: Health Behavior/Discharge Planning: Goal: Ability to manage health-related needs will improve 01/10/2021 1111 by Cristela Blue, RN Outcome: Progressing 01/10/2021 1111 by Cristela Blue, RN Outcome: Progressing   Problem: Clinical Measurements: Goal: Ability to maintain clinical measurements within normal limits will improve 01/10/2021 1111 by Cristela Blue, RN Outcome: Progressing 01/10/2021 1111 by Cristela Blue, RN Outcome: Progressing Goal: Will remain free from infection 01/10/2021 1111 by Cristela Blue, RN Outcome: Progressing 01/10/2021 1111 by Cristela Blue, RN Outcome: Progressing Goal: Diagnostic test results will improve 01/10/2021 1111 by Cristela Blue, RN Outcome: Progressing 01/10/2021 1111 by Cristela Blue, RN Outcome: Progressing Goal: Respiratory complications will improve 01/10/2021 1111 by Cristela Blue, RN Outcome: Progressing 01/10/2021 1111 by Cristela Blue, RN Outcome: Progressing Goal: Cardiovascular complication will be avoided 01/10/2021 1111 by Cristela Blue, RN Outcome: Progressing 01/10/2021 1111 by Cristela Blue, RN Outcome: Progressing   Problem: Activity: Goal: Risk for activity intolerance will decrease 01/10/2021 1111 by Cristela Blue, RN Outcome: Progressing 01/10/2021 1111 by Cristela Blue, RN Outcome: Progressing   Problem: Nutrition: Goal: Adequate nutrition will be maintained 01/10/2021 1111 by Cristela Blue, RN Outcome: Progressing 01/10/2021 1111 by Cristela Blue, RN Outcome: Progressing   Problem: Coping: Goal: Level of anxiety will decrease 01/10/2021 1111 by Cristela Blue, RN Outcome: Progressing 01/10/2021 1111 by  Cristela Blue, RN Outcome: Progressing   Problem: Elimination: Goal: Will not experience complications related to bowel motility 01/10/2021 1111 by Cristela Blue, RN Outcome: Progressing 01/10/2021 1111 by Cristela Blue, RN Outcome: Progressing Goal: Will not experience complications related to urinary retention 01/10/2021 1111 by Cristela Blue, RN Outcome: Progressing 01/10/2021 1111 by Cristela Blue, RN Outcome: Progressing   Problem: Pain Managment: Goal: General experience of comfort will improve 01/10/2021 1111 by Cristela Blue, RN Outcome: Progressing 01/10/2021 1111 by Cristela Blue, RN Outcome: Progressing   Problem: Safety: Goal: Ability to remain free from injury will improve 01/10/2021 1111 by Cristela Blue, RN Outcome: Progressing 01/10/2021 1111 by Cristela Blue, RN Outcome: Progressing   Problem: Skin Integrity: Goal: Risk for impaired skin integrity will decrease 01/10/2021 1111 by Cristela Blue, RN Outcome: Progressing 01/10/2021 1111 by Cristela Blue, RN Outcome: Progressing

## 2021-01-10 NOTE — Progress Notes (Signed)
PROGRESS NOTE    Christopher [name]  LKG:[id number] DOB: 01-28-1928 DOA: 01/03/2021 PCP: [name], MD   Chief complaint.  Confusion and agitation. Brief Narrative:  Christopher [name] is a 87 y.o. male with medical history significant for urothelial cancer of the bladder, history of atrial fibrillation on chronic anticoagulation therapy, hypertension, dyslipidemia who presents to the ER via EMS for evaluation of 2 witnessed syncopal episodes. Patient recently had cystoscopy with fulguration and cryosurgery for bladder cancer on 6/13.  He had a significant gross hematuria with a large amount of blood clots.  Evaluated by urology, started CBI. Cystoscopy and clot evacuation performed on 6/29. Developed rapid ventricular response with atrial fibrillation on 6/30.  Started on diltiazem drip, then transitioned to oral diltiazem.   Assessment & Plan:   Principal Problem:   Acute blood loss anemia Active Problems:   HTN (hypertension)   Benign prostatic hyperplasia with urinary frequency   Persistent atrial fibrillation (HCC)   Urothelial carcinoma of bladder (HCC)   Gross hematuria   Acute renal failure (ARF) (HCC)   Acute hypoxemic respiratory failure ([place])   Delirium  #1.  Delirium. Multifactorial, secondary to surgery, hospital environment, sleep deprivation.  He is given Seroquel, dose reduced to 25 mg every evening.  He still sleeping well. Condition seem to be improved.  #2.  Acute blood loss anemia. Hypovolemic shock, POA Iron deficient anemia. Gross hematuria secondary to bladder malignancy. Patient received IV iron, blood transfusion.  Status post blood clots evacuation.  Condition has improved.  Hemoglobin much better today after yesterday's transfusion.  3.  Acute hypoxemic respiratory failure. Acute on chronic diastolic congestive heart failure Condition essentially resolved, will wean off oxygen.  #4.  Paroxysmal atrial fibrillation with rapid ventricle  response. Transient hypotension. Condition had improved, heart rate much better. Cardiology is okay to hold off anticoagulation due to bladder bleeding.  5. Acute kidney injury secondary to obstruction. Hyponatremia. Hyperkalemia. Condition all improved.  6.  Generalized weakness. Continue PT/OT.  Pending nursing placement.  DVT prophylaxis: Discussed with daughter, will need to start prophylactic Lovenox, patient bleeding has stopped. Code Status: full Family Communication:  Disposition Plan:    Status is: Inpatient  Remains inpatient appropriate because:Inpatient level of care appropriate due to severity of illness  Dispo: The patient is from: Home              Anticipated d/c is to: SNF              Patient currently is medically stable to d/c.   Difficult to place patient No        I/O last 3 completed shifts: In: 1055 [P.O.:630; I.V.:50; Blood:375] Out: 2100 [Urine:2100] No intake/output data recorded.     Consultants:  Urology, Cardiology  Procedures: Cystoscopy.  Antimicrobials: None   Subjective: Patient slept well again last night, currently patient no longer has any confusion. Is still on 2 L oxygen, but does not feel short of breath. He has good appetite yesterday, he was fed by daughters.  No nausea vomiting abdominal pain. No chest pain palpitation. No fever or chills. No dysuria hematuria.  Objective: Vitals:   01/09/21 1630 01/09/21 1957 01/10/21 0345 01/10/21 0712  BP: (!) 129/55 (!) 127/50 (!) 130/56 (!) 119/53  Pulse: 81 92 69 96  Resp: 18   20  Temp: 98.7 F (37.1 C) 98.2 F (36.8 C) 98.4 F (36.9 C) 98.1 F (36.7 C)  TempSrc:    Oral  SpO2: 97% 98%  97% 98%  Weight:      Height:        Intake/Output Summary (Last 24 hours) at 01/10/2021 1027 Last data filed at 01/10/2021 0534 Gross per 24 hour  Intake 1055 ml  Output 1900 ml  Net -845 ml   Filed Weights   01/03/21 1253 01/03/21 2312 01/06/21 0429  Weight: 88.5 kg 90 kg  89.5 kg    Examination:  General exam: Appears calm and comfortable  Respiratory system: Clear to auscultation. Respiratory effort normal. Cardiovascular system: S1 & S2 heard, RRR. No JVD, murmurs, rubs, gallops or clicks. No pedal edema. Gastrointestinal system: Abdomen is nondistended, soft and nontender. No organomegaly or masses felt. Normal bowel sounds heard. Central nervous system: Alert and oriented x3. No focal neurological deficits. Extremities: Symmetric 5 x 5 power. Skin: No rashes, lesions or ulcers Psychiatry: Mood & affect appropriate.     Data Reviewed: I have personally reviewed following labs and imaging studies  CBC: Recent Labs  Lab 01/06/21 0601 01/06/21 1724 01/07/21 0607 01/07/21 1702 01/08/21 0517 01/08/21 1708 01/09/21 0641 01/10/21 0602  WBC 6.8  --  6.0  --  8.3  --  10.3 13.7*  NEUTROABS 5.2  --  4.3  --  5.9  --  7.6 10.6*  HGB 7.6*   < > 7.9* 8.4* 8.0* 8.7* 7.7* 9.6*  HCT 22.8*   < > 23.3* 25.6* 24.4* 26.9* 23.2* 29.0*  MCV 97.9  --  95.1  --  95.3  --  95.5 94.8  PLT 214  --  193  --  198  --  201 222   < > = values in this interval not displayed.   Basic Metabolic Panel: Recent Labs  Lab 01/06/21 0601 01/07/21 0607 01/08/21 0517 01/09/21 0641 01/10/21 0602  NA 136 136 138 137 139  K 5.1 4.1 3.6 3.5 3.8  CL 104 104 102 102 103  CO2 28 28 30 29 29   GLUCOSE 126* 112* 103* 96 110*  BUN 43* 39* 32* 25* 24*  CREATININE 2.00* 1.32* 1.10 1.02 1.07  CALCIUM 8.0* 8.3* 8.4* 8.1* 8.0*  MG 2.2 2.4 2.2 2.2 2.3  PHOS  --   --   --   --  2.9   GFR: Estimated Creatinine Clearance: 48.9 mL/min (by C-G formula based on SCr of 1.07 mg/dL). Liver Function Tests: No results for input(s): AST, ALT, ALKPHOS, BILITOT, PROT, ALBUMIN in the last 168 hours. No results for input(s): LIPASE, AMYLASE in the last 168 hours. No results for input(s): AMMONIA in the last 168 hours. Coagulation Profile: No results for input(s): INR, PROTIME in the last 168  hours. Cardiac Enzymes: No results for input(s): CKTOTAL, CKMB, CKMBINDEX, TROPONINI in the last 168 hours. BNP (last 3 results) No results for input(s): PROBNP in the last 8760 hours. HbA1C: No results for input(s): HGBA1C in the last 72 hours. CBG: No results for input(s): GLUCAP in the last 168 hours. Lipid Profile: No results for input(s): CHOL, HDL, LDLCALC, TRIG, CHOLHDL, LDLDIRECT in the last 72 hours. Thyroid Function Tests: No results for input(s): TSH, T4TOTAL, FREET4, T3FREE, THYROIDAB in the last 72 hours. Anemia Panel: No results for input(s): VITAMINB12, FOLATE, FERRITIN, TIBC, IRON, RETICCTPCT in the last 72 hours. Sepsis Labs: No results for input(s): PROCALCITON, LATICACIDVEN in the last 168 hours.  Recent Results (from the past 240 hour(s))  Resp Panel by RT-PCR (Flu A&B, Covid) Nasopharyngeal Swab     Status: None   Collection Time: 01/03/21  3:22 PM   Specimen: Nasopharyngeal Swab; Nasopharyngeal(NP) swabs in vial transport medium  Result Value Ref Range Status   SARS Coronavirus 2 by RT PCR NEGATIVE NEGATIVE Final    Comment: (NOTE) SARS-CoV-2 target nucleic acids are NOT DETECTED.  The SARS-CoV-2 RNA is generally detectable in upper respiratory specimens during the acute phase of infection. The lowest concentration of SARS-CoV-2 viral copies this assay can detect is 138 copies/mL. A negative result does not preclude SARS-Cov-2 infection and should not be used as the sole basis for treatment or other patient management decisions. A negative result may occur with  improper specimen collection/handling, submission of specimen other than nasopharyngeal swab, presence of viral mutation(s) within the areas targeted by this assay, and inadequate number of viral copies(<138 copies/mL). A negative result must be combined with clinical observations, patient history, and epidemiological information. The expected result is Negative.  Fact Sheet for Patients:   EntrepreneurPulse.com.au  Fact Sheet for Healthcare Providers:  IncredibleEmployment.be  This test is no t yet approved or cleared by the [place] FDA and  has been authorized for detection and/or diagnosis of SARS-CoV-2 by FDA under an Emergency Use Authorization (EUA). This EUA will remain  in effect (meaning this test can be used) for the duration of the COVID-19 declaration under Section 564(b)(1) of the Act, 21 U.S.C.section 360bbb-3(b)(1), unless the authorization is terminated  or revoked sooner.       Influenza A by PCR NEGATIVE NEGATIVE Final   Influenza B by PCR NEGATIVE NEGATIVE Final    Comment: (NOTE) The Xpert Xpress SARS-CoV-2/FLU/RSV plus assay is intended as an aid in the diagnosis of influenza from Nasopharyngeal swab specimens and should not be used as a sole basis for treatment. Nasal washings and aspirates are unacceptable for Xpert Xpress SARS-CoV-2/FLU/RSV testing.  Fact Sheet for Patients: EntrepreneurPulse.com.au  Fact Sheet for Healthcare Providers: IncredibleEmployment.be  This test is not yet approved or cleared by the [place] FDA and has been authorized for detection and/or diagnosis of SARS-CoV-2 by FDA under an Emergency Use Authorization (EUA). This EUA will remain in effect (meaning this test can be used) for the duration of the COVID-19 declaration under Section 564(b)(1) of the Act, 21 U.S.C. section 360bbb-3(b)(1), unless the authorization is terminated or revoked.  Performed at [facility], [address] [place], [place] [address]          Radiology Studies: CT HEAD WO CONTRAST  Result Date: 01/08/2021 CLINICAL DATA:  Delirium. EXAM: CT HEAD WITHOUT CONTRAST TECHNIQUE: Contiguous axial images were obtained from the base of the skull through the vertex without intravenous contrast. COMPARISON:  February 07, 2019. FINDINGS: Brain: Mild chronic  ischemic white matter disease is noted. No mass effect or midline shift is noted. Ventricular size is within normal limits. There is no evidence of mass lesion, hemorrhage or acute infarction. Vascular: No hyperdense vessel or unexpected calcification. Skull: Normal. Negative for fracture or focal lesion. Sinuses/Orbits: No acute finding. Other: None. IMPRESSION: No acute intracranial abnormality seen. Electronically Signed   By: Marijo Conception M.D.   On: 01/08/2021 14:31        Scheduled Meds:  alfuzosin  10 mg Oral Q breakfast   atorvastatin  10 mg Oral Daily   calcium carbonate  500 mg of elemental calcium Oral Q breakfast   cholecalciferol  2,000 Units Oral q AM   diltiazem  30 mg Oral Q6H   enoxaparin (LOVENOX) injection  40 mg Subcutaneous Q24H   ferrous sulfate  325 mg Oral Q breakfast   finasteride  5 mg Oral Daily   multivitamin with minerals  1 tablet Oral q AM   QUEtiapine  25 mg Oral QHS   tamsulosin  0.4 mg Oral Daily   Continuous Infusions:   LOS: 7 days    Time spent: 34 minutes    Sharen Hones, MD [facility]   To contact the attending provider between 7A-7P or the covering provider during after hours 7P-7A, please log into the web site [email or web address] and access using universal [place] password for that web site. If you do not have the password, please call the hospital operator.  01/10/2021, 10:27 AM

## 2021-01-10 NOTE — Care Management Important Message (Signed)
Important Message  Patient Details  Name: Christopher [name] MRN: [id number] Date of Birth: Nov 21, 1927   Medicare Important Message Given:  Yes     [name] 01/10/2021, 1:21 PM

## 2021-01-11 DIAGNOSIS — N178 Other acute kidney failure: Secondary | ICD-10-CM | POA: Diagnosis not present

## 2021-01-11 DIAGNOSIS — I4819 Other persistent atrial fibrillation: Secondary | ICD-10-CM | POA: Diagnosis not present

## 2021-01-11 DIAGNOSIS — R310 Gross hematuria: Secondary | ICD-10-CM | POA: Diagnosis not present

## 2021-01-11 DIAGNOSIS — D62 Acute posthemorrhagic anemia: Secondary | ICD-10-CM | POA: Diagnosis not present

## 2021-01-11 LAB — CBC WITH DIFFERENTIAL/PLATELET
Abs Immature Granulocytes: 0.19 10*3/uL — ABNORMAL HIGH (ref 0.00–0.07)
Basophils Absolute: 0 10*3/uL (ref 0.0–0.1)
Basophils Relative: 0 %
Eosinophils Absolute: 0.3 10*3/uL (ref 0.0–0.5)
Eosinophils Relative: 3 %
HCT: 31 % — ABNORMAL LOW (ref 39.0–52.0)
Hemoglobin: 9.9 g/dL — ABNORMAL LOW (ref 13.0–17.0)
Immature Granulocytes: 2 %
Lymphocytes Relative: 9 %
Lymphs Abs: 1 10*3/uL (ref 0.7–4.0)
MCH: 31 pg (ref 26.0–34.0)
MCHC: 31.9 g/dL (ref 30.0–36.0)
MCV: 97.2 fL (ref 80.0–100.0)
Monocytes Absolute: 1.4 10*3/uL — ABNORMAL HIGH (ref 0.1–1.0)
Monocytes Relative: 12 %
Neutro Abs: 8.7 10*3/uL — ABNORMAL HIGH (ref 1.7–7.7)
Neutrophils Relative %: 74 %
Platelets: 242 10*3/uL (ref 150–400)
RBC: 3.19 MIL/uL — ABNORMAL LOW (ref 4.22–5.81)
RDW: 14.8 % (ref 11.5–15.5)
WBC: 11.6 10*3/uL — ABNORMAL HIGH (ref 4.0–10.5)
nRBC: 0 % (ref 0.0–0.2)

## 2021-01-11 LAB — BASIC METABOLIC PANEL
Anion gap: 5 (ref 5–15)
BUN: 22 mg/dL (ref 8–23)
CO2: 31 mmol/L (ref 22–32)
Calcium: 8.1 mg/dL — ABNORMAL LOW (ref 8.9–10.3)
Chloride: 102 mmol/L (ref 98–111)
Creatinine, Ser: 1.07 mg/dL (ref 0.61–1.24)
GFR, Estimated: >60 mL/min (ref >60)
Glucose, Bld: 101 mg/dL — ABNORMAL HIGH (ref 70–99)
Potassium: 3.7 mmol/L (ref 3.5–5.1)
Sodium: 138 mmol/L (ref 135–145)

## 2021-01-11 LAB — PHOSPHORUS: Phosphorus: 2.8 mg/dL (ref 2.5–4.6)

## 2021-01-11 LAB — MAGNESIUM: Magnesium: 2.3 mg/dL (ref 1.7–2.4)

## 2021-01-11 MED ORDER — ACETAMINOPHEN 650 MG RE SUPP: 650.0000 mg | Freq: Four times a day (QID) | RECTAL | Status: DC | PRN

## 2021-01-11 MED ORDER — TORSEMIDE 20 MG PO TABS
40.0000 mg | ORAL_TABLET | Freq: Every day | ORAL | Status: DC
  Administered 2021-01-11 – 2021-01-13 (×3): 40 mg via ORAL
  Filled 2021-01-11 (×3): qty 2

## 2021-01-11 MED ORDER — ACETAMINOPHEN 325 MG PO TABS: 650.0000 mg | ORAL_TABLET | Freq: Four times a day (QID) | ORAL | Status: DC | PRN

## 2021-01-11 NOTE — TOC Progression Note (Signed)
Transition of Care [facility]) - Progression Note    Patient Details  Name: Christopher [name] MRN: [id number] Date of Birth: March 04, 1928  Transition of Care [facility]) CM/SW [place], [place] Phone Number: 01/11/2021, 1:20 PM  Clinical Narrative:     CSW provided bed offers to patient's daughters, they report they will review and let CSW know. CSW explained that facility will need to start [employer] once they decide, they expressed understanding to let CSW know as soon as possible.   Expected Discharge Plan: [place] Barriers to Discharge: Continued Medical Work up  Expected Discharge Plan and Services Expected Discharge Plan: [place] Choice: [employer] arrangements for the past 2 months: Single Family Home                                       Social Determinants of Health (SDOH) Interventions    Readmission Risk Interventions No flowsheet data found.

## 2021-01-11 NOTE — Progress Notes (Signed)
PROGRESS NOTE    Christopher [name]  QMG:[id number] DOB: 19-Mar-1928 DOA: 01/03/2021 PCP: [name], MD   Chief complaint.  Gross hematuria. Brief Narrative:  Christopher [name] is a 87 y.o. male with medical history significant for urothelial cancer of the bladder, history of atrial fibrillation on chronic anticoagulation therapy, hypertension, dyslipidemia who presents to the ER via EMS for evaluation of 2 witnessed syncopal episodes. Patient recently had cystoscopy with fulguration and cryosurgery for bladder cancer on 6/13.  He had a significant gross hematuria with a large amount of blood clots.  Evaluated by urology, started CBI. Cystoscopy and clot evacuation performed on 6/29. Developed rapid ventricular response with atrial fibrillation on 6/30.  Started on diltiazem drip, then transitioned to oral diltiazem.     Assessment & Plan:   Principal Problem:   Acute blood loss anemia Active Problems:   HTN (hypertension)   Benign prostatic hyperplasia with urinary frequency   Persistent atrial fibrillation (HCC)   Urothelial carcinoma of bladder (HCC)   Gross hematuria   Acute renal failure (ARF) (HCC)   Acute hypoxemic respiratory failure ([place])   Delirium  #1.  Delirium. Patient currently on 25 mg of Seroquel, he is sleeping better.  He no longer has any confusion.  2.  Acute blood loss anemia. Hypovolemic shock. Iron deficient anemia. Gross hematuria secondary to bladder malignancy. Patient is status post cystoscopy and clot evacuation.  He received IV iron, blood transfusion.  Hemoglobin has been much better.  #3.  Acute hypoxemic respiratory failure. Acute on chronic diastolic congestive heart failure. Patient condition is stable, he developed hypoxemia while walking, he still has some crackles in the base, I will start torsemide 40 mg daily. Appreciate cardiology consult.  4.  Paroxysmal atrial fibrillation with rapid ventricle response. Transient  hypotension. Condition had improved, currently patient heart rate is is better controlled with oral diltiazem. Per cardiology, will hold off anticoagulation due to recent bleeding.  #5.  Acute kidney injury secondary to obstruction. Hyponatremia  Hyperkalemia. Condition all resolved after cystoscopy.  6.  General weakness. Patient receiving PT/OT. Is currently pending nursing home placement.    DVT prophylaxis: Lovenox Code Status: full Family Communication: Daughter updated Disposition Plan:    Status is: Inpatient  Remains inpatient appropriate because:Inpatient level of care appropriate due to severity of illness  Dispo: The patient is from: Home              Anticipated d/c is to: SNF              Patient currently is medically stable to d/c.   Difficult to place patient No        I/O last 3 completed shifts: In: 390 [P.O.:390] Out: 1400 [Urine:1400] No intake/output data recorded.     Consultants:  Cardiology  Procedures: None  Antimicrobials: None   Subjective: Patient doing better, he is off the oxygen, however, he developed significant hypoxemia after transferring to the chair.  He is currently placed on 3 L oxygen after that. He slept much better night, no confusion or agitation. He denies any cough. No fever or chills. No dysuria hematuria.   Objective: Vitals:   01/10/21 2033 01/11/21 0346 01/11/21 0730 01/11/21 1139  BP: (!) 119/58 113/69 117/66 128/78  Pulse: 87 97 95 89  Resp:   20   Temp: 98.3 F (36.8 C) 98.5 F (36.9 C) 97.9 F (36.6 C) 98 F (36.7 C)  TempSrc: Oral  Oral Oral  SpO2: 94% 93%  94% 96%  Weight:      Height:        Intake/Output Summary (Last 24 hours) at 01/11/2021 1238 Last data filed at 01/10/2021 2242 Gross per 24 hour  Intake 240 ml  Output 400 ml  Net -160 ml   Filed Weights   01/03/21 1253 01/03/21 2312 01/06/21 0429  Weight: 88.5 kg 90 kg 89.5 kg    Examination:  General exam: Appears calm and  comfortable  Respiratory system: mild crackles in the base.  Respiratory effort normal. Cardiovascular system: Irregular.  No JVD, murmurs, rubs, gallops or clicks. No pedal edema. Gastrointestinal system: Abdomen is nondistended, soft and nontender. No organomegaly or masses felt. Normal bowel sounds heard. Central nervous system: Alert and oriented. No focal neurological deficits. Extremities: Symmetric 5 x 5 power. Skin: No rashes, lesions or ulcers Psychiatry: Judgement and insight appear normal. Mood & affect appropriate.     Data Reviewed: I have personally reviewed following labs and imaging studies  CBC: Recent Labs  Lab 01/07/21 0607 01/07/21 1702 01/08/21 0517 01/08/21 1708 01/09/21 0641 01/10/21 0602 01/11/21 0718  WBC 6.0  --  8.3  --  10.3 13.7* 11.6*  NEUTROABS 4.3  --  5.9  --  7.6 10.6* 8.7*  HGB 7.9*   < > 8.0* 8.7* 7.7* 9.6* 9.9*  HCT 23.3*   < > 24.4* 26.9* 23.2* 29.0* 31.0*  MCV 95.1  --  95.3  --  95.5 94.8 97.2  PLT 193  --  198  --  201 222 242   < > = values in this interval not displayed.   Basic Metabolic Panel: Recent Labs  Lab 01/07/21 0607 01/08/21 0517 01/09/21 0641 01/10/21 0602 01/11/21 0718  NA 136 138 137 139 138  K 4.1 3.6 3.5 3.8 3.7  CL 104 102 102 103 102  CO2 28 30 29 29 31   GLUCOSE 112* 103* 96 110* 101*  BUN 39* 32* 25* 24* 22  CREATININE 1.32* 1.10 1.02 1.07 1.07  CALCIUM 8.3* 8.4* 8.1* 8.0* 8.1*  MG 2.4 2.2 2.2 2.3 2.3  PHOS  --   --   --  2.9 2.8   GFR: Estimated Creatinine Clearance: 48.9 mL/min (by C-G formula based on SCr of 1.07 mg/dL). Liver Function Tests: No results for input(s): AST, ALT, ALKPHOS, BILITOT, PROT, ALBUMIN in the last 168 hours. No results for input(s): LIPASE, AMYLASE in the last 168 hours. No results for input(s): AMMONIA in the last 168 hours. Coagulation Profile: No results for input(s): INR, PROTIME in the last 168 hours. Cardiac Enzymes: No results for input(s): CKTOTAL, CKMB, CKMBINDEX,  TROPONINI in the last 168 hours. BNP (last 3 results) No results for input(s): PROBNP in the last 8760 hours. HbA1C: No results for input(s): HGBA1C in the last 72 hours. CBG: No results for input(s): GLUCAP in the last 168 hours. Lipid Profile: No results for input(s): CHOL, HDL, LDLCALC, TRIG, CHOLHDL, LDLDIRECT in the last 72 hours. Thyroid Function Tests: No results for input(s): TSH, T4TOTAL, FREET4, T3FREE, THYROIDAB in the last 72 hours. Anemia Panel: No results for input(s): VITAMINB12, FOLATE, FERRITIN, TIBC, IRON, RETICCTPCT in the last 72 hours. Sepsis Labs: No results for input(s): PROCALCITON, LATICACIDVEN in the last 168 hours.  Recent Results (from the past 240 hour(s))  Resp Panel by RT-PCR (Flu A&B, Covid) Nasopharyngeal Swab     Status: None   Collection Time: 01/03/21  3:22 PM   Specimen: Nasopharyngeal Swab; Nasopharyngeal(NP) swabs in vial transport medium  Result Value Ref Range Status   SARS Coronavirus 2 by RT PCR NEGATIVE NEGATIVE Final    Comment: (NOTE) SARS-CoV-2 target nucleic acids are NOT DETECTED.  The SARS-CoV-2 RNA is generally detectable in upper respiratory specimens during the acute phase of infection. The lowest concentration of SARS-CoV-2 viral copies this assay can detect is 138 copies/mL. A negative result does not preclude SARS-Cov-2 infection and should not be used as the sole basis for treatment or other patient management decisions. A negative result may occur with  improper specimen collection/handling, submission of specimen other than nasopharyngeal swab, presence of viral mutation(s) within the areas targeted by this assay, and inadequate number of viral copies(<138 copies/mL). A negative result must be combined with clinical observations, patient history, and epidemiological information. The expected result is Negative.  Fact Sheet for Patients:  EntrepreneurPulse.com.au  Fact Sheet for Healthcare Providers:   IncredibleEmployment.be  This test is no t yet approved or cleared by the [place] FDA and  has been authorized for detection and/or diagnosis of SARS-CoV-2 by FDA under an Emergency Use Authorization (EUA). This EUA will remain  in effect (meaning this test can be used) for the duration of the COVID-19 declaration under Section 564(b)(1) of the Act, 21 U.S.C.section 360bbb-3(b)(1), unless the authorization is terminated  or revoked sooner.       Influenza A by PCR NEGATIVE NEGATIVE Final   Influenza B by PCR NEGATIVE NEGATIVE Final    Comment: (NOTE) The Xpert Xpress SARS-CoV-2/FLU/RSV plus assay is intended as an aid in the diagnosis of influenza from Nasopharyngeal swab specimens and should not be used as a sole basis for treatment. Nasal washings and aspirates are unacceptable for Xpert Xpress SARS-CoV-2/FLU/RSV testing.  Fact Sheet for Patients: EntrepreneurPulse.com.au  Fact Sheet for Healthcare Providers: IncredibleEmployment.be  This test is not yet approved or cleared by the [place] FDA and has been authorized for detection and/or diagnosis of SARS-CoV-2 by FDA under an Emergency Use Authorization (EUA). This EUA will remain in effect (meaning this test can be used) for the duration of the COVID-19 declaration under Section 564(b)(1) of the Act, 21 U.S.C. section 360bbb-3(b)(1), unless the authorization is terminated or revoked.  Performed at [facility], [address], [place], [place] [address]          Radiology Studies: No results found.      Scheduled Meds:  alfuzosin  10 mg Oral Q breakfast   atorvastatin  10 mg Oral Daily   calcium carbonate  500 mg of elemental calcium Oral Q breakfast   cholecalciferol  2,000 Units Oral q AM   diltiazem  30 mg Oral Q6H   enoxaparin (LOVENOX) injection  40 mg Subcutaneous Q24H   ferrous sulfate  325 mg Oral Q breakfast    finasteride  5 mg Oral Daily   multivitamin with minerals  1 tablet Oral q AM   QUEtiapine  25 mg Oral QHS   tamsulosin  0.4 mg Oral Daily   torsemide  40 mg Oral Daily   Continuous Infusions:   LOS: 8 days    Time spent: 34 minutes    Sharen Hones, MD [facility]   To contact the attending provider between 7A-7P or the covering provider during after hours 7P-7A, please log into the web site [email or web address] and access using universal [place] password for that web site. If you do not have the password, please call the hospital operator.  01/11/2021, 12:38 PM

## 2021-01-11 NOTE — Progress Notes (Signed)
Occupational Therapy Treatment Patient Details Name: EDNA GROVER MRN: [id number] DOB: May 14, 1928 Today's Date: 01/11/2021    History of present illness Pt is a 87 yo male s/p cystoscopy with fulguration and cryotherapy 6/13/022, experienced two syncopal episodes and hematuria, after discharging home, returned to [facility] [facility]. 01/05/2021 underwent cystoscopy with clot evacuation and liberation.  He developed atrial fibrillation with rapid ventricular response and was placed on a diltiazem drip.  PMH of afib, bladder cancer, HLD, HTN, cardiac pacemaker, spinal cord stimulator   OT comments  Chart reviewed, pt greeted in room with two daughters present, agreeable to OT tx session. Pt reports worsening cough, reports MD is aware, is educated on importance of mobility during inpatient stay. Pt performs LB dressing with MOD A, UB dressing with MOD A, peri care with MOD A. Pt performs supine>seated at EOB with MIN A with HOB raised, STS with MIN A with RW, transfer to bed side chair with RW with MIN A. Upon seated in chair, pt SPO2 ranging from 88-91% with 3L O2. Pt is not endorsing SOB or discomfort. RN and MD notified. Pt is left in bedside chair, NAD, all needs met +chair alarm, call bell. OT will continue to follow while admitted.   Follow Up Recommendations  SNF;Supervision/Assistance - 24 hour    Equipment Recommendations  Other (comment)    Recommendations for Other Services      Precautions / Restrictions Precautions Precautions: Fall Restrictions Weight Bearing Restrictions: No       Mobility Bed Mobility Overal bed mobility: Needs Assistance Bed Mobility: Supine to Sit     Supine to sit: HOB elevated;Min assist          Transfers Overall transfer level: Needs assistance Equipment used: Rolling walker (2 wheeled) Transfers: Sit to/from [facility] Sit to Stand: Min assist Stand pivot transfers: Min assist            Balance Overall balance  assessment: Needs assistance Sitting-balance support: Feet supported Sitting balance-Leahy Scale: Fair   Postural control: Posterior lean Standing balance support: Bilateral upper extremity supported Standing balance-Leahy Scale: Fair                             ADL either performed or assessed with clinical judgement   ADL Overall ADL's : Needs assistance/impaired             Lower Body Bathing: Moderate assistance Lower Body Bathing Details (indicate cue type and reason): peri care Upper Body Dressing : Moderate assistance Upper Body Dressing Details (indicate cue type and reason): gown Lower Body Dressing: Moderate assistance Lower Body Dressing Details (indicate cue type and reason): socks             Functional mobility during ADLs: Minimal assistance;Rolling walker       Vision Patient Visual Report: No change from baseline     Perception     Praxis      Cognition Arousal/Alertness: Awake/alert Behavior During Therapy: WFL for tasks assessed/performed                                   General Comments: Pt A&O x4        Exercises     Shoulder Instructions       General Comments BP in supine: 126/50; seated at edge of bed: 115/64; Pt with 88-91% SPO2 when seated in recliner  following mobility with 3L 02. Pt does not endorse SOB or discomfort. Care team notified.    Pertinent Vitals/ Pain       Pain Assessment: No/denies pain   Progress Toward Goals  OT Goals(current goals can now be found in the care plan section)  Progress towards OT goals: Progressing toward goals  Acute Rehab OT Goals Patient Stated Goal: to get stronger OT Goal Formulation: With patient Time For Goal Achievement: 01/25/21 Potential to Achieve Goals: Good  Plan Discharge plan remains appropriate    Co-evaluation                 AM-PAC OT "6 Clicks" Daily Activity     Outcome Measure   Help from another person eating meals?:  None Help from another person taking care of personal grooming?: A Little   Help from another person bathing (including washing, rinsing, drying)?: A Lot Help from another person to put on and taking off regular upper body clothing?: A Little Help from another person to put on and taking off regular lower body clothing?: A Lot 6 Click Score: 14    End of Session Equipment Utilized During Treatment: Oxygen  OT Visit Diagnosis: Unsteadiness on feet (R26.81);Repeated falls (R29.6);Muscle weakness (generalized) (M62.81)   Activity Tolerance Patient tolerated treatment well   Patient Left in chair;with call bell/phone within reach;with chair alarm set   Nurse Communication Other (comment) (vital signs)        Time: 4287-6811 OT Time Calculation (min): 22 min  Charges: OT General Charges $OT Visit: 1 Visit OT Treatments $Self Care/Home Management : 8-22 mins Shanon Payor, OTD OTR/L  01/11/21, 12:40 PM

## 2021-01-11 NOTE — Progress Notes (Signed)
Patient Name: DEMICO [name] Date of Encounter: 01/11/2021  Hospital Problem List     Principal Problem:   Acute blood loss anemia Active Problems:   HTN (hypertension)   Benign prostatic hyperplasia with urinary frequency   Persistent atrial fibrillation (HCC)   Urothelial carcinoma of bladder (HCC)   Gross hematuria   Acute renal failure (ARF) (HCC)   Acute hypoxemic respiratory failure [facility])   Delirium    Patient Profile         87 y.o. male with history of urethral carcinoma of the bladder, paroxysmal atrial fibrillation on chronic anticoagulation therapy, hypertension and hyperlipidemia who is status post cystoscopy with fulguration and cryosurgery on December 20, 2020.  He was discharged to home with a Foley that was removed 2 days later without any problems.  1 day prior to admission on January 03, 2021 he had hematuria and multiple clots per his bladder.  Had 2 syncopal episodes while upright.  He was brought to the emergency room where he was noted to be significantly hypotensive with gross hematuria.  Laboratories revealed creatinine of 1.07.  Hemoglobin was 8.9 down from 13.11-week prior.  Twelve-lead EKG showed sinus rhythm.  He was seen by urology and was taken off of Eliquis which he was on for his A. fib.  On 01/05/2021 underwent cystoscopy with clot evacuation and liberation.  He developed atrial fibrillation with rapid ventricular response and was placed on a diltiazem drip.  His hemodynamics remained stable.  He was somewhat confused.  Consultation called regarding A. fib with RVR.  Patient is currently on diltiazem drip.  He 1 dose of IV digoxin.  He is currently in atrial fibrillation with controlled ventricular response with ventricular rates between the mid 80s to low 100s.    Subjective   Feels better  Inpatient Medications     alfuzosin  10 mg Oral Q breakfast   atorvastatin  10 mg Oral Daily   calcium carbonate  500 mg of elemental calcium Oral Q breakfast    cholecalciferol  2,000 Units Oral q AM   diltiazem  30 mg Oral Q6H   enoxaparin (LOVENOX) injection  40 mg Subcutaneous Q24H   ferrous sulfate  325 mg Oral Q breakfast   finasteride  5 mg Oral Daily   multivitamin with minerals  1 tablet Oral q AM   QUEtiapine  25 mg Oral QHS   tamsulosin  0.4 mg Oral Daily    Vital Signs    Vitals:   01/10/21 1650 01/10/21 2033 01/11/21 0346 01/11/21 0730  BP: 138/65 (!) 119/58 113/69 117/66  Pulse: 85 87 97 95  Resp:    20  Temp: 98.4 F (36.9 C) 98.3 F (36.8 C) 98.5 F (36.9 C) 97.9 F (36.6 C)  TempSrc: Oral Oral  Oral  SpO2: 96% 94% 93% 94%  Weight:      Height:        Intake/Output Summary (Last 24 hours) at 01/11/2021 1104 Last data filed at 01/10/2021 2242 Gross per 24 hour  Intake 240 ml  Output 400 ml  Net -160 ml   Filed Weights   01/03/21 1253 01/03/21 2312 01/06/21 0429  Weight: 88.5 kg 90 kg 89.5 kg    Physical Exam    GEN: Well nourished, well developed, in no acute distress.  HEENT: normal.  Neck: Supple, no JVD, carotid bruits, or masses. Cardiac: irr, irr Respiratory:  Respirations regular and unlabored, clear to auscultation bilaterally. GI: Soft, nontender, nondistended, BS +  x 4. MS: no deformity or atrophy. Skin: warm and dry, no rash. Neuro:  a and o to person and place.   Labs    CBC Recent Labs    01/10/21 0602 01/11/21 0718  WBC 13.7* 11.6*  NEUTROABS 10.6* 8.7*  HGB 9.6* 9.9*  HCT 29.0* 31.0*  MCV 94.8 97.2  PLT 222 202   Basic Metabolic Panel Recent Labs    01/10/21 0602 01/11/21 0718  NA 139 138  K 3.8 3.7  CL 103 102  CO2 29 31  GLUCOSE 110* 101*  BUN 24* 22  CREATININE 1.07 1.07  CALCIUM 8.0* 8.1*  MG 2.3 2.3  PHOS 2.9 2.8   Liver Function Tests No results for input(s): AST, ALT, ALKPHOS, BILITOT, PROT, ALBUMIN in the last 72 hours. No results for input(s): LIPASE, AMYLASE in the last 72 hours. Cardiac Enzymes No results for input(s): CKTOTAL, CKMB, CKMBINDEX, TROPONINI  in the last 72 hours. BNP No results for input(s): BNP in the last 72 hours. D-Dimer No results for input(s): DDIMER in the last 72 hours. Hemoglobin A1C No results for input(s): HGBA1C in the last 72 hours. Fasting Lipid Panel No results for input(s): CHOL, HDL, LDLCALC, TRIG, CHOLHDL, LDLDIRECT in the last 72 hours. Thyroid Function Tests No results for input(s): TSH, T4TOTAL, T3FREE, THYROIDAB in the last 72 hours.  Invalid input(s): FREET3  Telemetry    Afib with variable vr  ECG       Radiology    CT HEAD WO CONTRAST  Result Date: 01/08/2021 CLINICAL DATA:  Delirium. EXAM: CT HEAD WITHOUT CONTRAST TECHNIQUE: Contiguous axial images were obtained from the base of the skull through the vertex without intravenous contrast. COMPARISON:  February 07, 2019. FINDINGS: Brain: Mild chronic ischemic white matter disease is noted. No mass effect or midline shift is noted. Ventricular size is within normal limits. There is no evidence of mass lesion, hemorrhage or acute infarction. Vascular: No hyperdense vessel or unexpected calcification. Skull: Normal. Negative for fracture or focal lesion. Sinuses/Orbits: No acute finding. Other: None. IMPRESSION: No acute intracranial abnormality seen. Electronically Signed   By: Marijo Conception M.D.   On: 01/08/2021 14:31   DG OR UROLOGY CYSTO IMAGE ([facility] ONLY)  Result Date: 01/05/2021 There is no interpretation for this exam.  This order is for images obtained during a surgical procedure.  Please See "Surgeries" Tab for more information regarding the procedure.   ECHOCARDIOGRAM COMPLETE  Result Date: 01/07/2021    ECHOCARDIOGRAM REPORT   Patient Name:   Christopher [name] Date of Exam: 01/07/2021 Medical Rec #:  [id number]         Height:       71.0 in Accession #:    [id number]        Weight:       197.3 lb Date of Birth:  April 09, 1928         BSA:          2.096 m Patient Age:    62 years          BP:           111/83 mmHg Patient Gender: M                  HR:           65 bpm. Exam Location:  [facility] Procedure: 2D Echo, Cardiac Doppler and Color Doppler Indications:     CHF-acute diastolic Y07.37  History:  Patient has prior history of Echocardiogram examinations, most                  recent 03/01/2015. Pacemaker, Arrythmias:Atrial Fibrillation;                  Risk Factors:Dyslipidemia.  Sonographer:     Sherrie Sport RDCS (AE) Referring Phys:  [id number] Sharen Hones Diagnosing Phys: Bartholome Bill MD  Sonographer Comments: Technically challenging study due to limited acoustic windows, suboptimal apical window and suboptimal subcostal window. IMPRESSIONS  1. Left ventricular ejection fraction, by estimation, is 70 to 75%. The left ventricle has hyperdynamic function. The left ventricle has no regional wall motion abnormalities. There is mild left ventricular hypertrophy. Left ventricular diastolic parameters are consistent with Grade I diastolic dysfunction (impaired relaxation).  2. Right ventricular systolic function is normal. The right ventricular size is normal.  3. Right atrial size was mildly dilated.  4. The mitral valve is grossly normal. Trivial mitral valve regurgitation.  5. The aortic valve is grossly normal. Aortic valve regurgitation is trivial. Mild to moderate aortic valve sclerosis/calcification is present, without any evidence of aortic stenosis. FINDINGS  Left Ventricle: Left ventricular ejection fraction, by estimation, is 70 to 75%. The left ventricle has hyperdynamic function. The left ventricle has no regional wall motion abnormalities. The left ventricular internal cavity size was normal in size. There is mild left ventricular hypertrophy. Left ventricular diastolic parameters are consistent with Grade I diastolic dysfunction (impaired relaxation). Right Ventricle: The right ventricular size is normal. No increase in right ventricular wall thickness. Right ventricular systolic function is normal. Left Atrium: Left atrial size was normal in  size. Right Atrium: Right atrial size was mildly dilated. Pericardium: There is no evidence of pericardial effusion. Mitral Valve: The mitral valve is grossly normal. Trivial mitral valve regurgitation. Tricuspid Valve: The tricuspid valve is grossly normal. Tricuspid valve regurgitation is trivial. Aortic Valve: The aortic valve is grossly normal. Aortic valve regurgitation is trivial. Mild to moderate aortic valve sclerosis/calcification is present, without any evidence of aortic stenosis. Aortic valve mean gradient measures 11.7 mmHg. Aortic valve peak gradient measures 21.6 mmHg. Aortic valve area, by VTI measures 1.57 cm. Pulmonic Valve: The pulmonic valve was not well visualized. Pulmonic valve regurgitation is not visualized. Aorta: The aortic root is normal in size and structure. IAS/Shunts: The atrial septum is grossly normal.  LEFT VENTRICLE PLAX 2D LVIDd:         3.71 cm  Diastology LVIDs:         2.03 cm  LV e' medial:    5.33 cm/s LV PW:         1.34 cm  LV E/e' medial:  21.6 LV IVS:        1.45 cm  LV e' lateral:   9.68 cm/s LVOT diam:     2.00 cm  LV E/e' lateral: 11.9 LV SV:         63 LV SV Index:   30 LVOT Area:     3.14 cm  RIGHT VENTRICLE RV S prime:     16.40 cm/s TAPSE (M-mode): 4.8 cm LEFT ATRIUM              Index       RIGHT ATRIUM           Index LA diam:        3.60 cm  1.72 cm/m  RA Area:     27.40 cm LA Vol (A2C):   74.0  ml  35.30 ml/m RA Volume:   85.00 ml  40.54 ml/m LA Vol (A4C):   119.0 ml 56.76 ml/m LA Biplane Vol: 103.0 ml 49.13 ml/m  AORTIC VALVE                    PULMONIC VALVE AV Area (Vmax):    1.66 cm     PV Vmax:        0.93 m/s AV Area (Vmean):   1.53 cm     PV Peak grad:   3.5 mmHg AV Area (VTI):     1.57 cm     RVOT Peak grad: 3 mmHg AV Vmax:           232.33 cm/s AV Vmean:          161.333 cm/s AV VTI:            0.397 m AV Peak Grad:      21.6 mmHg AV Mean Grad:      11.7 mmHg LVOT Vmax:         123.00 cm/s LVOT Vmean:        78.500 cm/s LVOT VTI:           0.199 m LVOT/AV VTI ratio: 0.50  AORTA Ao Root diam: 3.50 cm MITRAL VALVE                TRICUSPID VALVE MV Area (PHT): 3.54 cm     TR Peak grad:   18.8 mmHg MV Decel Time: 214 msec     TR Vmax:        217.00 cm/s MV E velocity: 115.00 cm/s MV A velocity: 68.60 cm/s   SHUNTS MV E/A ratio:  1.68         Systemic VTI:  0.20 m                             Systemic Diam: 2.00 cm Bartholome Bill MD Electronically signed by Bartholome Bill MD Signature Date/Time: 01/07/2021/3:19:25 PM    Final     Assessment & Plan       87 year old male with history of paroxysmal A. fib, history of bladder carcinoma who was admitted with hematuria and penile pain post cystoscopy.  Underwent urologic evaluation.  He was taken off of Eliquis.  Has a history of paroxysmal atrial fibrillation.  Was in sinus rhythm on admission but developed A. fib with RVR. Blood pressure little soft but currently hemodynamically stable.  1.  A. fib-had A. fib with variable ventricular response.  He is on Eliquis as an outpatient.  This was held due to his urologic procedures and hematuria.  Continues with 30 mg Cardizem p.o. every 6.  Rate is fairly stable on this.  Does increase somewhat during his agitated periods.  Would continue with metoprolol as well..  We will continue to remain off Eliquis for now given continued GI blood loss.    2.  Bladder carcinoma-being followed by urology.  Appreciate their input.  We will continue to hold Eliquis.  3.  Confusion-agitated this morning.  He is however able to hold correctly state his location and date.  Some of this appears to be situational due to sundowning.  Is nonfocal.  4.  Anemia-likely secondary to hematuria.  S/p transfusion. HGB improved.   5.  Hypotension-stable at present.  Signed, Javier Docker Kelcy Baeten MD 01/11/2021, 11:04 AM  Pager: [phone]

## 2021-01-11 NOTE — Plan of Care (Signed)
  Problem: Education: Goal: Knowledge of General Education information will improve Description: Including pain rating scale, medication(s)/side effects and non-pharmacologic comfort measures 01/11/2021 0915 by Cristela Blue, RN Outcome: Progressing 01/11/2021 0915 by Cristela Blue, RN Outcome: Progressing   Problem: Health Behavior/Discharge Planning: Goal: Ability to manage health-related needs will improve 01/11/2021 0915 by Cristela Blue, RN Outcome: Progressing 01/11/2021 0915 by Cristela Blue, RN Outcome: Progressing   Problem: Clinical Measurements: Goal: Ability to maintain clinical measurements within normal limits will improve 01/11/2021 0915 by Cristela Blue, RN Outcome: Progressing 01/11/2021 0915 by Cristela Blue, RN Outcome: Progressing Goal: Will remain free from infection 01/11/2021 0915 by Cristela Blue, RN Outcome: Progressing 01/11/2021 0915 by Cristela Blue, RN Outcome: Progressing Goal: Diagnostic test results will improve 01/11/2021 0915 by Cristela Blue, RN Outcome: Progressing 01/11/2021 0915 by Cristela Blue, RN Outcome: Progressing Goal: Respiratory complications will improve 01/11/2021 0915 by Cristela Blue, RN Outcome: Progressing 01/11/2021 0915 by Cristela Blue, RN Outcome: Progressing Goal: Cardiovascular complication will be avoided 01/11/2021 0915 by Cristela Blue, RN Outcome: Progressing 01/11/2021 0915 by Cristela Blue, RN Outcome: Progressing   Problem: Activity: Goal: Risk for activity intolerance will decrease 01/11/2021 0915 by Cristela Blue, RN Outcome: Progressing 01/11/2021 0915 by Cristela Blue, RN Outcome: Progressing   Problem: Nutrition: Goal: Adequate nutrition will be maintained 01/11/2021 0915 by Cristela Blue, RN Outcome: Progressing 01/11/2021 0915 by Cristela Blue, RN Outcome: Progressing   Problem: Coping: Goal: Level of anxiety will decrease 01/11/2021 0915 by Cristela Blue, RN Outcome: Progressing 01/11/2021 0915 by  Cristela Blue, RN Outcome: Progressing   Problem: Elimination: Goal: Will not experience complications related to bowel motility 01/11/2021 0915 by Cristela Blue, RN Outcome: Progressing 01/11/2021 0915 by Cristela Blue, RN Outcome: Progressing Goal: Will not experience complications related to urinary retention 01/11/2021 0915 by Cristela Blue, RN Outcome: Progressing 01/11/2021 0915 by Cristela Blue, RN Outcome: Progressing   Problem: Pain Managment: Goal: General experience of comfort will improve 01/11/2021 0915 by Cristela Blue, RN Outcome: Progressing 01/11/2021 0915 by Cristela Blue, RN Outcome: Progressing   Problem: Safety: Goal: Ability to remain free from injury will improve 01/11/2021 0915 by Cristela Blue, RN Outcome: Progressing 01/11/2021 0915 by Cristela Blue, RN Outcome: Progressing   Problem: Skin Integrity: Goal: Risk for impaired skin integrity will decrease 01/11/2021 0915 by Cristela Blue, RN Outcome: Progressing 01/11/2021 0915 by Cristela Blue, RN Outcome: Progressing

## 2021-01-11 NOTE — Progress Notes (Signed)
Physical Therapy Treatment Patient Details Name: Christopher [name] MRN: [id number] DOB: 1928-06-24 Today's Date: 01/11/2021    History of Present Illness Pt is a 87 yo male s/p cystoscopy with fulguration and cryotherapy 6/13/022, experienced two syncopal episodes and hematuria, after discharging home, returned to [place]. 01/05/2021 underwent cystoscopy with clot evacuation and liberation.  He developed atrial fibrillation with rapid ventricular response and was placed on a diltiazem drip.  PMH of afib, bladder cancer, HLD, HTN, cardiac pacemaker, spinal cord stimulator    PT Comments    Pt was sitting in recliner still from earlier OT session. " I had PT already." Explained role of PT versus OT. Pt is very agreeable to PT session and is cooperative throughout. This session was greatly impacted by fatigue and cardio-pulmonary response to activity. He  tolerated standing 3 x from recliner with third trial to step pivot back to EOB. HR quickly raised into 150-160 bpm with only standing a few seconds, Pt endorses difficulty breathing and SOB. Prolonged sitting rest between standing trials. RN made aware of deconditioning/ concerns. He was in bed at conclusion of session with call bell in reach and bed alarm in place. Acute PT will continue to follow and progress as able per POC. Highly recommend DC to SNF when stable to address deficits while improving independence with ADLs.    Follow Up Recommendations  SNF;Supervision for mobility/OOB     Equipment Recommendations  3in1 (PT)       Precautions / Restrictions Precautions Precautions: Fall Restrictions Weight Bearing Restrictions: No    Mobility  Bed Mobility Overal bed mobility: Needs Assistance Bed Mobility: Sit to Supine     Supine to sit: HOB elevated;Min assist Sit to supine: Mod assist;HOB elevated   General bed mobility comments: Increased time to perform. mod assist progressing BLEs into bed.    Transfers Overall transfer  level: Needs assistance Equipment used: Rolling walker (2 wheeled) Transfers: Sit to/from Stand Sit to Stand: Min assist Stand pivot transfers: Min assist       General transfer comment: Pt performed STS 3 x throughout session from recliner. session greatly limited by pt fatigue and response to activity. Pt becomes SOB and endorses feeling poorly in static standing after ~ 5 sec. prolong rest to recover. HR in 150s (A-fib) RR quickly elevated to 40s. sao2 stable on 3 L at 90-93%. RN made aware of activity tolerance concerns. Did assist pt back to bed with stand step to EOB from recliner.  Ambulation/Gait    General Gait Details: Unsafe at this time 2/2 to activity tolerance and response to just static standing .      Balance Overall balance assessment: Needs assistance Sitting-balance support: Feet supported Sitting balance-Leahy Scale: Good Sitting balance - Comments: no LOB in sitting Postural control: Posterior lean Standing balance support: Bilateral upper extremity supported Standing balance-Leahy Scale: Fair Standing balance comment: heavy reliance on RW however steady in static standing. will assess dynamic balance in future sessions.       Cognition Arousal/Alertness: Awake/alert Behavior During Therapy: WFL for tasks assessed/performed Overall Cognitive Status: No family/caregiver present to determine baseline cognitive functioning         General Comments: Pt A&O x4         General Comments General comments (skin integrity, edema, etc.): BP in supine: 126/50; seated at edge of bed: 115/64; Pt with 88-91% SPO2 when seated in recliner following mobility with 3L 02. Pt does not endorse SOB or discomfort. Care team notified.  Pertinent Vitals/Pain Pain Assessment: 0-10 Pain Score: 4  Pain Location: LBP Pain Intervention(s): Limited activity within patient's tolerance;Monitored during session;Repositioned     PT Goals (current goals can now be found in the  care plan section) Acute Rehab PT Goals Patient Stated Goal: to get stronger Progress towards PT goals: Progressing toward goals    Frequency    Min 2X/week      PT Plan Current plan remains appropriate       AM-PAC PT "6 Clicks" Mobility   Outcome Measure  Help needed turning from your back to your side while in a flat bed without using bedrails?: A Little Help needed moving from lying on your back to sitting on the side of a flat bed without using bedrails?: A Little Help needed moving to and from a bed to a chair (including a wheelchair)?: A Little Help needed standing up from a chair using your arms (e.g., wheelchair or bedside chair)?: A Little Help needed to walk in hospital room?: A Lot Help needed climbing 3-5 steps with a railing? : A Lot 6 Click Score: 16    End of Session Equipment Utilized During Treatment: Oxygen (3 L) Activity Tolerance: Patient limited by fatigue Patient left: in bed;with call bell/phone within reach;with bed alarm set Nurse Communication: Mobility status PT Visit Diagnosis: Other abnormalities of gait and mobility (R26.89);Difficulty in walking, not elsewhere classified (R26.2);Muscle weakness (generalized) (M62.81)     Time: 3810-1751 PT Time Calculation (min) (ACUTE ONLY): 24 min  Charges:  $Therapeutic Activity: 23-37 mins                    Julaine Fusi PTA 01/11/21, 1:46 PM

## 2021-01-12 ENCOUNTER — Inpatient Hospital Stay: Payer: Medicare HMO

## 2021-01-12 DIAGNOSIS — D62 Acute posthemorrhagic anemia: Secondary | ICD-10-CM | POA: Diagnosis not present

## 2021-01-12 LAB — CBC WITH DIFFERENTIAL/PLATELET
Abs Immature Granulocytes: 0.15 10*3/uL — ABNORMAL HIGH (ref 0.00–0.07)
Basophils Absolute: 0 10*3/uL (ref 0.0–0.1)
Basophils Relative: 0 %
Eosinophils Absolute: 0.4 10*3/uL (ref 0.0–0.5)
Eosinophils Relative: 4 %
HCT: 30.6 % — ABNORMAL LOW (ref 39.0–52.0)
Hemoglobin: 9.9 g/dL — ABNORMAL LOW (ref 13.0–17.0)
Immature Granulocytes: 2 %
Lymphocytes Relative: 10 %
Lymphs Abs: 1.1 10*3/uL (ref 0.7–4.0)
MCH: 31.4 pg (ref 26.0–34.0)
MCHC: 32.4 g/dL (ref 30.0–36.0)
MCV: 97.1 fL (ref 80.0–100.0)
Monocytes Absolute: 1.2 10*3/uL — ABNORMAL HIGH (ref 0.1–1.0)
Monocytes Relative: 12 %
Neutro Abs: 7.4 10*3/uL (ref 1.7–7.7)
Neutrophils Relative %: 72 %
Platelets: 250 10*3/uL (ref 150–400)
RBC: 3.15 MIL/uL — ABNORMAL LOW (ref 4.22–5.81)
RDW: 14.8 % (ref 11.5–15.5)
WBC: 10.3 10*3/uL (ref 4.0–10.5)
nRBC: 0 % (ref 0.0–0.2)

## 2021-01-12 LAB — BASIC METABOLIC PANEL
Anion gap: 10 (ref 5–15)
BUN: 24 mg/dL — ABNORMAL HIGH (ref 8–23)
CO2: 29 mmol/L (ref 22–32)
Calcium: 7.9 mg/dL — ABNORMAL LOW (ref 8.9–10.3)
Chloride: 100 mmol/L (ref 98–111)
Creatinine, Ser: 1.05 mg/dL (ref 0.61–1.24)
GFR, Estimated: >60 mL/min (ref >60)
Glucose, Bld: 100 mg/dL — ABNORMAL HIGH (ref 70–99)
Potassium: 3.6 mmol/L (ref 3.5–5.1)
Sodium: 139 mmol/L (ref 135–145)

## 2021-01-12 LAB — MAGNESIUM: Magnesium: 2.4 mg/dL (ref 1.7–2.4)

## 2021-01-12 IMAGING — CR DG CHEST 2V
1 series · 3 of 3 positions shown · non-contrast
Comparison: [DATE]

CLINICAL DATA: Dyspnea

EXAM:
CHEST - 2 VIEW

[Series 1: dg chest 2 view · 0.14mm/px · 3 of 3 slices shown]
[im 1/3]
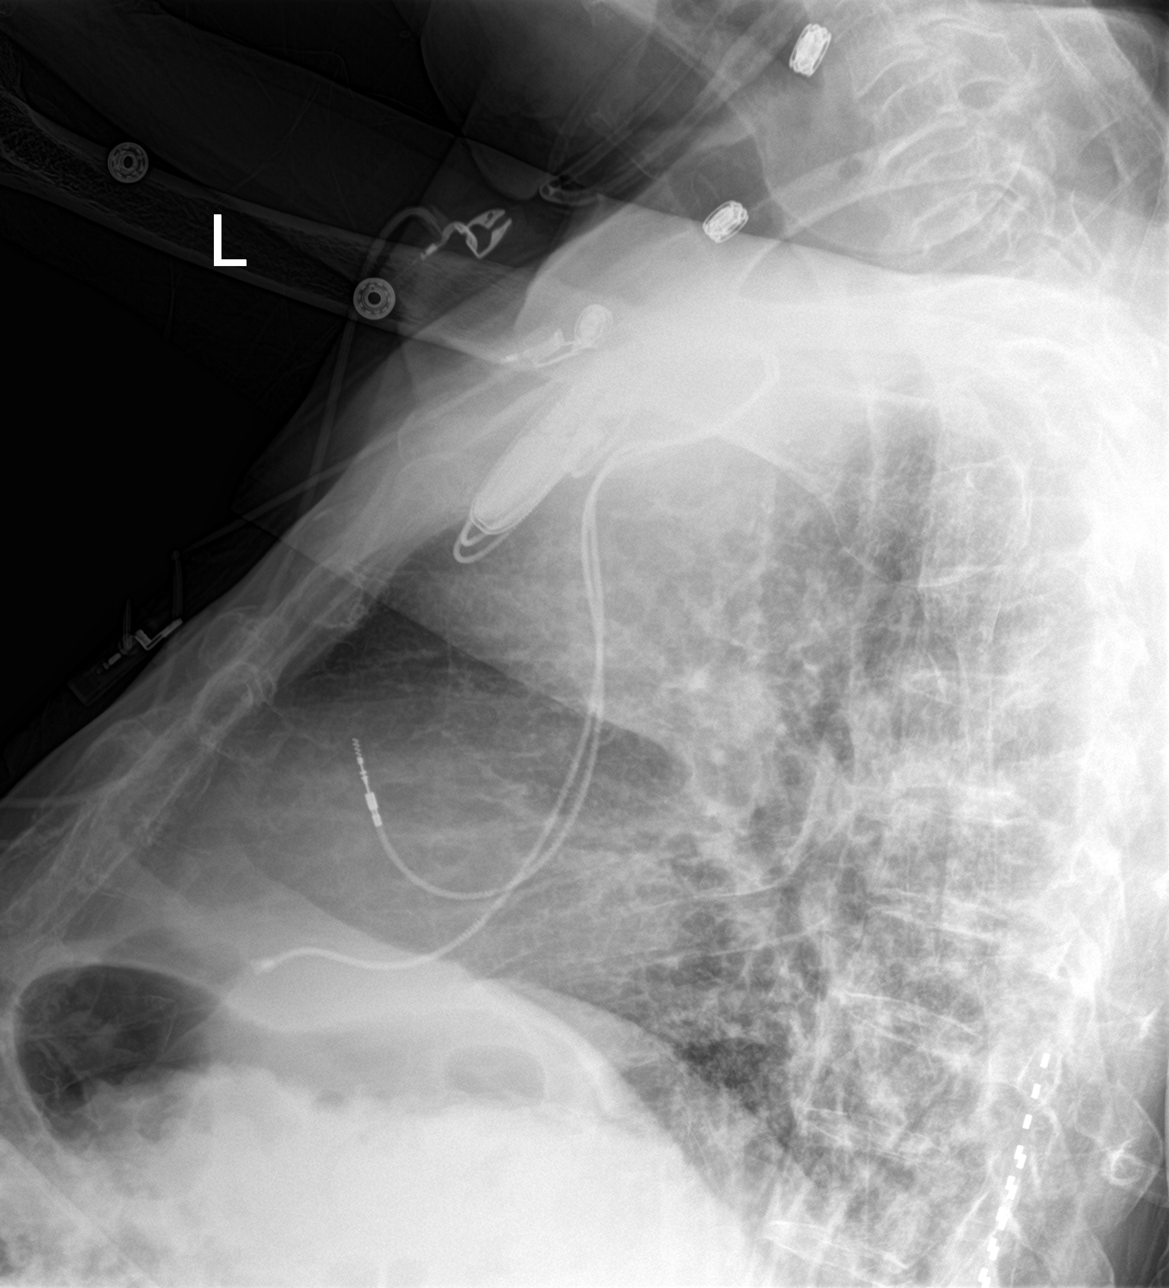
[im 2/3]
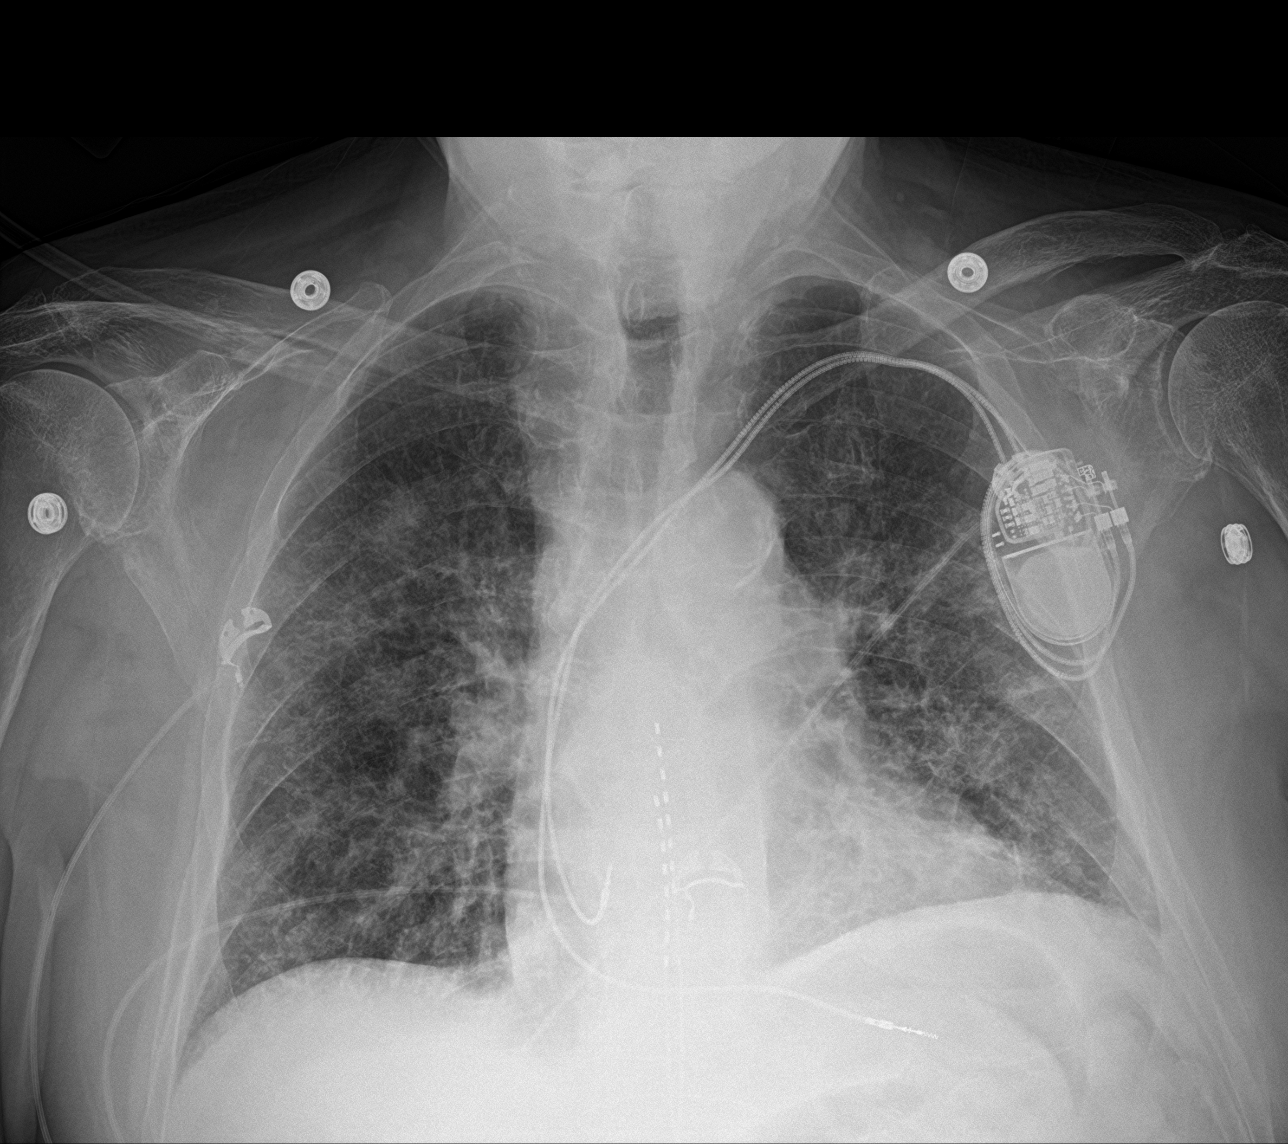
[im 3/3]
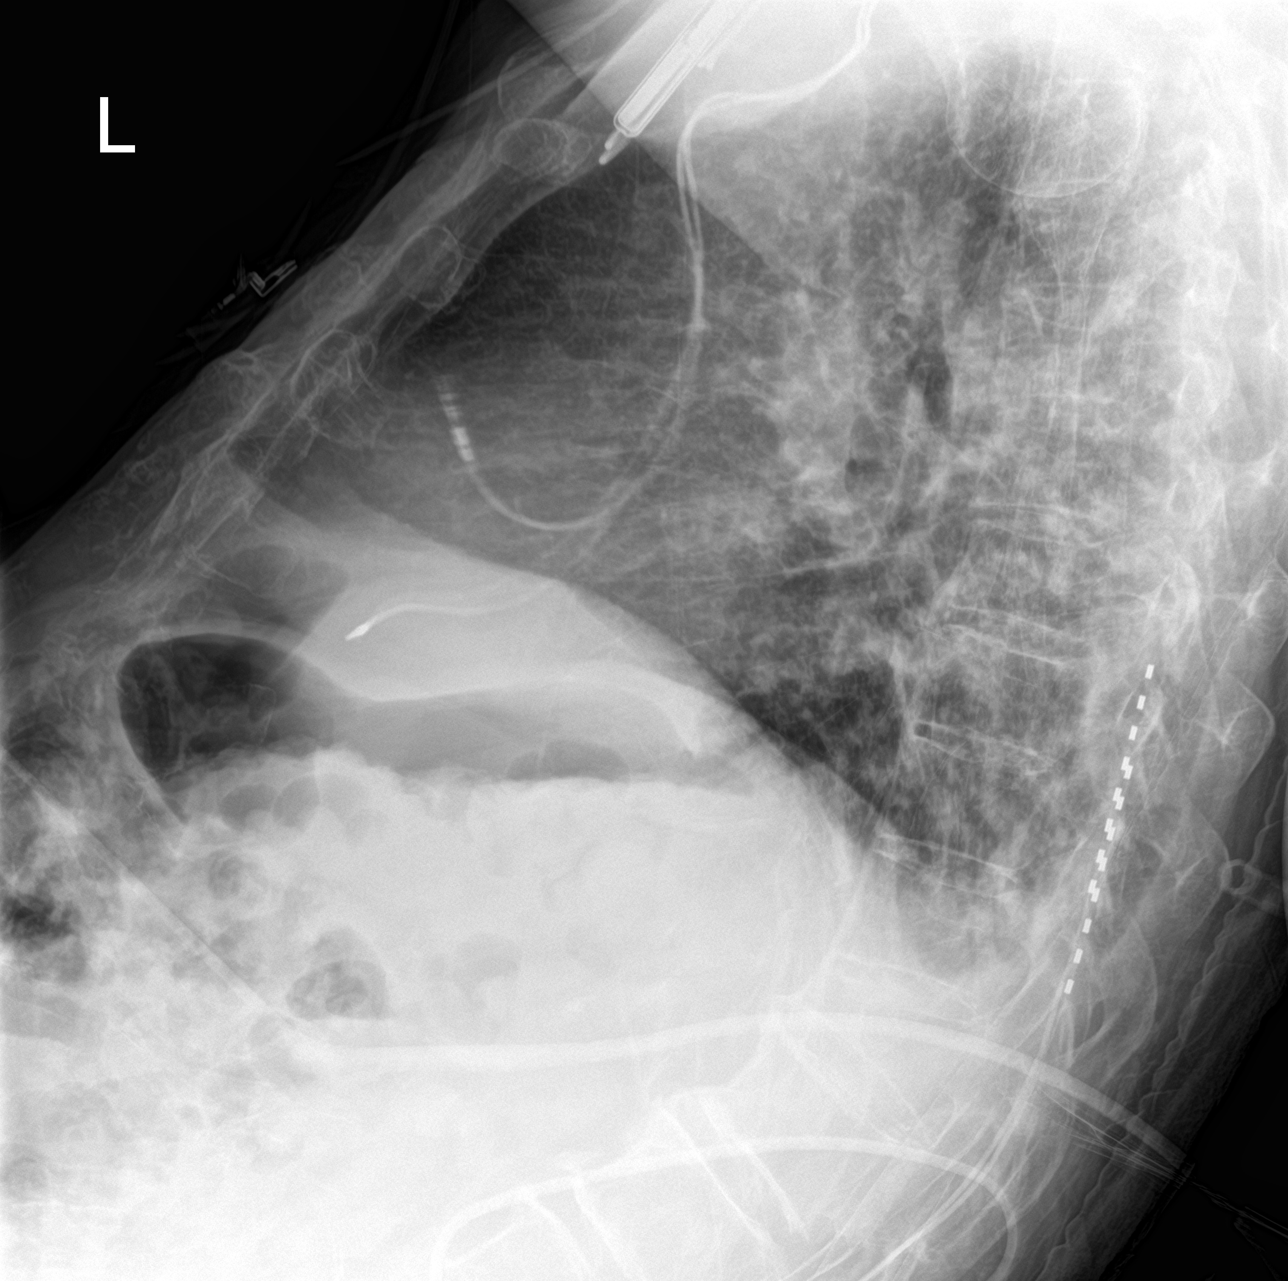

[3 of 3 positions shown; findings below may reference images not displayed]

FINDINGS: Left-sided pacing device with leads over the right atrium and right
ventricle. Borderline cardiomegaly with aortic atherosclerosis.
Coarse chronic interstitial disease. Patchy bilateral airspace
opacities suspicious for superimposed pneumonia. No pneumothorax.
Small pleural effusions on lateral view.
IMPRESSION: 1. Patchy bilateral opacities suspicious for acute airspace
disease/probable pneumonia superimposed on underlying chronic
interstitial changes.
2. Borderline cardiac size.  Small pleural effusions.

## 2021-01-12 NOTE — Progress Notes (Signed)
PT Cancellation Note  Patient Details Name: Christopher [name] MRN: [id number] DOB: 05/19/1928   Cancelled Treatment:     PT attempt. Pt requesting author return at a later time 2/2 to just finishing breakfast and requesting to rest. Acute PT will continue to follow and progress as able per POC.    Willette Pa 01/12/2021, 3:50 PM

## 2021-01-12 NOTE — Progress Notes (Signed)
Patient Name: Christopher [name] Date of Encounter: 01/12/2021  Hospital Problem List     Principal Problem:   Acute blood loss anemia Active Problems:   HTN (hypertension)   Benign prostatic hyperplasia with urinary frequency   Persistent atrial fibrillation (HCC)   Urothelial carcinoma of bladder (HCC)   Gross hematuria   Acute renal failure (ARF) (HCC)   Acute hypoxemic respiratory failure [facility])   Delirium    Patient Profile       87 y.o. male with history of urethral carcinoma of the bladder, paroxysmal atrial fibrillation on chronic anticoagulation therapy, hypertension and hyperlipidemia who is status post cystoscopy with fulguration and cryosurgery on December 20, 2020.  He was discharged to home with a Foley that was removed 2 days later without any problems.  1 day prior to admission on January 03, 2021 he had hematuria and multiple clots per his bladder.  Had 2 syncopal episodes while upright.  He was brought to the emergency room where he was noted to be significantly hypotensive with gross hematuria.  Laboratories revealed creatinine of 1.07.  Hemoglobin was 8.9 down from 13.11-week prior.  Twelve-lead EKG showed sinus rhythm.  He was seen by urology and was taken off of Eliquis which he was on for his A. fib.  On 01/05/2021 underwent cystoscopy with clot evacuation and liberation.  He developed atrial fibrillation with rapid ventricular response and was placed on a diltiazem drip.  His hemodynamics remained stable.  He was somewhat confused.  Consultation called regarding A. fib with RVR.  Patient is currently on diltiazem drip.  He 1 dose of IV digoxin.  He is currently in atrial fibrillation with controlled ventricular response with ventricular rates between the mid 80s to low 100s.    Subjective   Less confused.  Inpatient Medications     alfuzosin  10 mg Oral Q breakfast   atorvastatin  10 mg Oral Daily   calcium carbonate  500 mg of elemental calcium Oral Q breakfast    cholecalciferol  2,000 Units Oral q AM   diltiazem  30 mg Oral Q6H   enoxaparin (LOVENOX) injection  40 mg Subcutaneous Q24H   ferrous sulfate  325 mg Oral Q breakfast   finasteride  5 mg Oral Daily   multivitamin with minerals  1 tablet Oral q AM   QUEtiapine  25 mg Oral QHS   tamsulosin  0.4 mg Oral Daily   torsemide  40 mg Oral Daily    Vital Signs    Vitals:   01/12/21 0406 01/12/21 0731 01/12/21 1149 01/12/21 1535  BP: 117/74 127/64 (!) 108/58 112/65  Pulse: 87 72 94 75  Resp: 20 18 18    Temp: 98.1 F (36.7 C) 98.2 F (36.8 C) 98 F (36.7 C) 97.7 F (36.5 C)  TempSrc: Oral Oral Oral Oral  SpO2: 98% 99% 94% 99%  Weight:      Height:        Intake/Output Summary (Last 24 hours) at 01/12/2021 1631 Last data filed at 01/12/2021 1355 Gross per 24 hour  Intake 0 ml  Output 2600 ml  Net -2600 ml   Filed Weights   01/03/21 1253 01/03/21 2312 01/06/21 0429  Weight: 88.5 kg 90 kg 89.5 kg    Physical Exam    GEN: Chronically ill appearing HEENT: normal.  Neck: Supple, no JVD, carotid bruits, or masses. Cardiac: irr Respiratory:  Respirations regular and unlabored, clear to auscultation bilaterally. GI: Soft, nontender, nondistended, BS + x  4. MS: no deformity or atrophy. Skin: warm and dry, no rash. Neuro:  Strength and sensation are intact. Psych: Normal affect.  Labs    CBC Recent Labs    01/11/21 0718 01/12/21 0545  WBC 11.6* 10.3  NEUTROABS 8.7* 7.4  HGB 9.9* 9.9*  HCT 31.0* 30.6*  MCV 97.2 97.1  PLT 242 462   Basic Metabolic Panel Recent Labs    01/10/21 0602 01/11/21 0718 01/12/21 0545  NA 139 138 139  K 3.8 3.7 3.6  CL 103 102 100  CO2 29 31 29   GLUCOSE 110* 101* 100*  BUN 24* 22 24*  CREATININE 1.07 1.07 1.05  CALCIUM 8.0* 8.1* 7.9*  MG 2.3 2.3 2.4  PHOS 2.9 2.8  --    Liver Function Tests No results for input(s): AST, ALT, ALKPHOS, BILITOT, PROT, ALBUMIN in the last 72 hours. No results for input(s): LIPASE, AMYLASE in the last 72  hours. Cardiac Enzymes No results for input(s): CKTOTAL, CKMB, CKMBINDEX, TROPONINI in the last 72 hours. BNP No results for input(s): BNP in the last 72 hours. D-Dimer No results for input(s): DDIMER in the last 72 hours. Hemoglobin A1C No results for input(s): HGBA1C in the last 72 hours. Fasting Lipid Panel No results for input(s): CHOL, HDL, LDLCALC, TRIG, CHOLHDL, LDLDIRECT in the last 72 hours. Thyroid Function Tests No results for input(s): TSH, T4TOTAL, T3FREE, THYROIDAB in the last 72 hours.  Invalid input(s): FREET3  Telemetry    Afib with controlled vr  ECG    afib  Radiology    CT HEAD WO CONTRAST  Result Date: 01/08/2021 CLINICAL DATA:  Delirium. EXAM: CT HEAD WITHOUT CONTRAST TECHNIQUE: Contiguous axial images were obtained from the base of the skull through the vertex without intravenous contrast. COMPARISON:  February 07, 2019. FINDINGS: Brain: Mild chronic ischemic white matter disease is noted. No mass effect or midline shift is noted. Ventricular size is within normal limits. There is no evidence of mass lesion, hemorrhage or acute infarction. Vascular: No hyperdense vessel or unexpected calcification. Skull: Normal. Negative for fracture or focal lesion. Sinuses/Orbits: No acute finding. Other: None. IMPRESSION: No acute intracranial abnormality seen. Electronically Signed   By: Marijo Conception M.D.   On: 01/08/2021 14:31   DG OR UROLOGY CYSTO IMAGE ([facility] ONLY)  Result Date: 01/05/2021 There is no interpretation for this exam.  This order is for images obtained during a surgical procedure.  Please See "Surgeries" Tab for more information regarding the procedure.   ECHOCARDIOGRAM COMPLETE  Result Date: 01/07/2021    ECHOCARDIOGRAM REPORT   Patient Name:   Christopher [name] Date of Exam: 01/07/2021 Medical Rec #:  [id number]         Height:       71.0 in Accession #:    [id number]        Weight:       197.3 lb Date of Birth:  [birth year]         BSA:          2.096 m  Patient Age:    66 years          BP:           111/83 mmHg Patient Gender: M                 HR:           65 bpm. Exam Location:  [facility] Procedure: 2D Echo, Cardiac Doppler and Color Doppler Indications:  CHF-acute diastolic T06.26  History:         Patient has prior history of Echocardiogram examinations, most                  recent 03/01/2015. Pacemaker, Arrythmias:Atrial Fibrillation;                  Risk Factors:Dyslipidemia.  Sonographer:     Sherrie Sport RDCS (AE) Referring Phys:  [id number] Sharen Hones Diagnosing Phys: Bartholome Bill MD  Sonographer Comments: Technically challenging study due to limited acoustic windows, suboptimal apical window and suboptimal subcostal window. IMPRESSIONS  1. Left ventricular ejection fraction, by estimation, is 70 to 75%. The left ventricle has hyperdynamic function. The left ventricle has no regional wall motion abnormalities. There is mild left ventricular hypertrophy. Left ventricular diastolic parameters are consistent with Grade I diastolic dysfunction (impaired relaxation).  2. Right ventricular systolic function is normal. The right ventricular size is normal.  3. Right atrial size was mildly dilated.  4. The mitral valve is grossly normal. Trivial mitral valve regurgitation.  5. The aortic valve is grossly normal. Aortic valve regurgitation is trivial. Mild to moderate aortic valve sclerosis/calcification is present, without any evidence of aortic stenosis. FINDINGS  Left Ventricle: Left ventricular ejection fraction, by estimation, is 70 to 75%. The left ventricle has hyperdynamic function. The left ventricle has no regional wall motion abnormalities. The left ventricular internal cavity size was normal in size. There is mild left ventricular hypertrophy. Left ventricular diastolic parameters are consistent with Grade I diastolic dysfunction (impaired relaxation). Right Ventricle: The right ventricular size is normal. No increase in right ventricular wall thickness.  Right ventricular systolic function is normal. Left Atrium: Left atrial size was normal in size. Right Atrium: Right atrial size was mildly dilated. Pericardium: There is no evidence of pericardial effusion. Mitral Valve: The mitral valve is grossly normal. Trivial mitral valve regurgitation. Tricuspid Valve: The tricuspid valve is grossly normal. Tricuspid valve regurgitation is trivial. Aortic Valve: The aortic valve is grossly normal. Aortic valve regurgitation is trivial. Mild to moderate aortic valve sclerosis/calcification is present, without any evidence of aortic stenosis. Aortic valve mean gradient measures 11.7 mmHg. Aortic valve peak gradient measures 21.6 mmHg. Aortic valve area, by VTI measures 1.57 cm. Pulmonic Valve: The pulmonic valve was not well visualized. Pulmonic valve regurgitation is not visualized. Aorta: The aortic root is normal in size and structure. IAS/Shunts: The atrial septum is grossly normal.  LEFT VENTRICLE PLAX 2D LVIDd:         3.71 cm  Diastology LVIDs:         2.03 cm  LV e' medial:    5.33 cm/s LV PW:         1.34 cm  LV E/e' medial:  21.6 LV IVS:        1.45 cm  LV e' lateral:   9.68 cm/s LVOT diam:     2.00 cm  LV E/e' lateral: 11.9 LV SV:         63 LV SV Index:   30 LVOT Area:     3.14 cm  RIGHT VENTRICLE RV S prime:     16.40 cm/s TAPSE (M-mode): 4.8 cm LEFT ATRIUM              Index       RIGHT ATRIUM           Index LA diam:        3.60 cm  1.72 cm/m  RA  Area:     27.40 cm LA Vol (A2C):   74.0 ml  35.30 ml/m RA Volume:   85.00 ml  40.54 ml/m LA Vol (A4C):   119.0 ml 56.76 ml/m LA Biplane Vol: 103.0 ml 49.13 ml/m  AORTIC VALVE                    PULMONIC VALVE AV Area (Vmax):    1.66 cm     PV Vmax:        0.93 m/s AV Area (Vmean):   1.53 cm     PV Peak grad:   3.5 mmHg AV Area (VTI):     1.57 cm     RVOT Peak grad: 3 mmHg AV Vmax:           232.33 cm/s AV Vmean:          161.333 cm/s AV VTI:            0.397 m AV Peak Grad:      21.6 mmHg AV Mean Grad:       11.7 mmHg LVOT Vmax:         123.00 cm/s LVOT Vmean:        78.500 cm/s LVOT VTI:          0.199 m LVOT/AV VTI ratio: 0.50  AORTA Ao Root diam: 3.50 cm MITRAL VALVE                TRICUSPID VALVE MV Area (PHT): 3.54 cm     TR Peak grad:   18.8 mmHg MV Decel Time: 214 msec     TR Vmax:        217.00 cm/s MV E velocity: 115.00 cm/s MV A velocity: 68.60 cm/s   SHUNTS MV E/A ratio:  1.68         Systemic VTI:  0.20 m                             Systemic Diam: 2.00 cm Bartholome Bill MD Electronically signed by Bartholome Bill MD Signature Date/Time: 01/07/2021/3:19:25 PM    Final     Assessment & Plan     87 year old male with history of paroxysmal A. fib, history of bladder carcinoma who was admitted with hematuria and penile pain post cystoscopy.  Underwent urologic evaluation.  He was taken off of Eliquis.  Has a history of paroxysmal atrial fibrillation.  Was in sinus rhythm on admission but developed A. fib with RVR. Blood pressure little soft but currently hemodynamically stable.  1.  A. fib-had A. fib with variable ventricular response.  He is on Eliquis as an outpatient.  This was held due to his urologic procedures and hematuria.  Continues with 30 mg Cardizem p.o. every 6.  Rate is fairly stable on this.  Does increase somewhat during his agitated periods.  Would continue with metoprolol as well..  We will continue to remain off Eliquis for now given continued GI blood loss.  Rate is better.  More alert today.  2.  Bladder carcinoma-being followed by urology.  Appreciate their input.  We will continue to hold Eliquis.  3.  Confusion-agitated this morning.  He is however able to hold correctly state his location and date.    4.  Anemia-likely secondary to hematuria.  S/p transfusion  5.  Hypotension-stable at present  Signed, Chrissie Noa A. Clever Geraldo MD 01/12/2021, 4:31 PM  Pager: [phone]

## 2021-01-12 NOTE — TOC Progression Note (Signed)
Transition of Care [facility]) - Progression Note    Patient Details  Name: Christopher [name] MRN: [id number] Date of Birth: 03-Aug-1927  Transition of Care [facility]) CM/SW [place], [place] Phone Number: 01/12/2021, 9:27 AM  Clinical Narrative:     CSW spoke with patient's daughter who reports if no other bed offers, they would be agreeable to [facility] as they have visited available bed offers and would prefer [facility].   CSW has reached out to [place] for them to start insurance auth with [employer].   Pending insurance auth at this time.    Expected Discharge Plan: [place] Barriers to Discharge: Continued Medical Work up  Expected Discharge Plan and Services Expected Discharge Plan: [place] Choice: [place] arrangements for the past 2 months: Single Family Home                                       Social Determinants of Health (SDOH) Interventions    Readmission Risk Interventions No flowsheet data found.

## 2021-01-12 NOTE — Progress Notes (Addendum)
PROGRESS NOTE    Christopher [name]  VQM:[id number] DOB: 20-Oct-1927 DOA: 01/03/2021 PCP: [name], MD   Chief complaint.  Gross hematuria. Brief Narrative:  Christopher [name] is a 87 y.o. male with medical history significant for urothelial cancer of the bladder, history of atrial fibrillation on chronic anticoagulation therapy, hypertension, dyslipidemia who presents to the ER via EMS for evaluation of 2 witnessed syncopal episodes. Patient recently had cystoscopy with fulguration and cryosurgery for bladder cancer on 6/13.  He had a significant gross hematuria with a large amount of blood clots.  Evaluated by urology, started CBI. Cystoscopy and clot evacuation performed on 6/29. Developed rapid ventricular response with atrial fibrillation on 6/30.  Started on diltiazem drip, then transitioned to oral diltiazem.     Assessment & Plan:   Principal Problem:   Acute blood loss anemia Active Problems:   HTN (hypertension)   Benign prostatic hyperplasia with urinary frequency   Persistent atrial fibrillation (HCC)   Urothelial carcinoma of bladder (HCC)   Gross hematuria   Acute renal failure (ARF) (HCC)   Acute hypoxemic respiratory failure ([place])   Delirium  #1.  Delirium, resolved --cont seroquel  2.  Acute blood loss anemia. Hypovolemic shock. Iron deficient anemia. Gross hematuria secondary to bladder malignancy. Patient is status post cystoscopy and clot evacuation.   He received IV iron, 3u blood transfusion.   --monitor Hgb --cont to hold Eliquis  Acute hypoxic respiratory failure Worsening dyspnea --on 3L O2 --CXR, procal and BNP  Acute on chronic diastolic congestive heart failure. --cont torsemide 40 mg daily  4.  Paroxysmal atrial fibrillation with rapid ventricle response. Transient hypotension. Condition had improved, currently patient heart rate is is better controlled with oral diltiazem. Plan: --cont dilt 30 mg q6h --cont metop --cont to  hold Eliquis  #5.  Acute kidney injury secondary to obstruction. Hyponatremia  Hyperkalemia. Condition all resolved after cystoscopy.  6.  General weakness. --PT/OT --discharge to SNF   DVT prophylaxis: Lovenox Code Status: full Family Communication:  Disposition Plan:    Status is: Inpatient  Remains inpatient appropriate because:Inpatient level of care appropriate due to severity of illness  Dispo: The patient is from: Home              Anticipated d/c is to: SNF              Patient currently is medically stable to d/c.   Difficult to place patient No   I/O last 3 completed shifts: In: 0  Out: 2850 [Urine:2850] No intake/output data recorded.     Consultants:  Cardiology  Procedures: None  Antimicrobials: None   Subjective: Pt reported increased dyspnea and cough.  No hematuria.     Objective: Vitals:   01/12/21 0731 01/12/21 1149 01/12/21 1535 01/12/21 1704  BP: 127/64 (!) 108/58 112/65 122/62  Pulse: 72 94 75 62  Resp: 18 18  18   Temp: 98.2 F (36.8 C) 98 F (36.7 C) 97.7 F (36.5 C) 97.8 F (36.6 C)  TempSrc: Oral Oral Oral   SpO2: 99% 94% 99% 96%  Weight:      Height:        Intake/Output Summary (Last 24 hours) at 01/12/2021 1956 Last data filed at 01/12/2021 1840 Gross per 24 hour  Intake 0 ml  Output 1650 ml  Net -1650 ml   Filed Weights   01/03/21 1253 01/03/21 2312 01/06/21 0429  Weight: 88.5 kg 90 kg 89.5 kg    Examination:  Constitutional:  NAD, AAOx3 HEENT: conjunctivae and lids normal, EOMI CV: irregular, No cyanosis.   RESP: cough with deep breathing Extremities: No effusions, edema in BLE SKIN: warm, dry Neuro: II - XII grossly intact.   Psych: Normal mood and affect.  Appropriate judgement and reason   Data Reviewed: I have personally reviewed following labs and imaging studies  CBC: Recent Labs  Lab 01/08/21 0517 01/08/21 1708 01/09/21 0641 01/10/21 0602 01/11/21 0718 01/12/21 0545  WBC 8.3  --  10.3  13.7* 11.6* 10.3  NEUTROABS 5.9  --  7.6 10.6* 8.7* 7.4  HGB 8.0* 8.7* 7.7* 9.6* 9.9* 9.9*  HCT 24.4* 26.9* 23.2* 29.0* 31.0* 30.6*  MCV 95.3  --  95.5 94.8 97.2 97.1  PLT 198  --  201 222 242 782   Basic Metabolic Panel: Recent Labs  Lab 01/08/21 0517 01/09/21 0641 01/10/21 0602 01/11/21 0718 01/12/21 0545  NA 138 137 139 138 139  K 3.6 3.5 3.8 3.7 3.6  CL 102 102 103 102 100  CO2 30 29 29 31 29   GLUCOSE 103* 96 110* 101* 100*  BUN 32* 25* 24* 22 24*  CREATININE 1.10 1.02 1.07 1.07 1.05  CALCIUM 8.4* 8.1* 8.0* 8.1* 7.9*  MG 2.2 2.2 2.3 2.3 2.4  PHOS  --   --  2.9 2.8  --    GFR: Estimated Creatinine Clearance: 49.8 mL/min (by C-G formula based on SCr of 1.05 mg/dL). Liver Function Tests: No results for input(s): AST, ALT, ALKPHOS, BILITOT, PROT, ALBUMIN in the last 168 hours. No results for input(s): LIPASE, AMYLASE in the last 168 hours. No results for input(s): AMMONIA in the last 168 hours. Coagulation Profile: No results for input(s): INR, PROTIME in the last 168 hours. Cardiac Enzymes: No results for input(s): CKTOTAL, CKMB, CKMBINDEX, TROPONINI in the last 168 hours. BNP (last 3 results) No results for input(s): PROBNP in the last 8760 hours. HbA1C: No results for input(s): HGBA1C in the last 72 hours. CBG: No results for input(s): GLUCAP in the last 168 hours. Lipid Profile: No results for input(s): CHOL, HDL, LDLCALC, TRIG, CHOLHDL, LDLDIRECT in the last 72 hours. Thyroid Function Tests: No results for input(s): TSH, T4TOTAL, FREET4, T3FREE, THYROIDAB in the last 72 hours. Anemia Panel: No results for input(s): VITAMINB12, FOLATE, FERRITIN, TIBC, IRON, RETICCTPCT in the last 72 hours. Sepsis Labs: No results for input(s): PROCALCITON, LATICACIDVEN in the last 168 hours.  Recent Results (from the past 240 hour(s))  Resp Panel by RT-PCR (Flu A&B, Covid) Nasopharyngeal Swab     Status: None   Collection Time: 01/03/21  3:22 PM   Specimen: Nasopharyngeal  Swab; Nasopharyngeal(NP) swabs in vial transport medium  Result Value Ref Range Status   SARS Coronavirus 2 by RT PCR NEGATIVE NEGATIVE Final    Comment: (NOTE) SARS-CoV-2 target nucleic acids are NOT DETECTED.  The SARS-CoV-2 RNA is generally detectable in upper respiratory specimens during the acute phase of infection. The lowest concentration of SARS-CoV-2 viral copies this assay can detect is 138 copies/mL. A negative result does not preclude SARS-Cov-2 infection and should not be used as the sole basis for treatment or other patient management decisions. A negative result may occur with  improper specimen collection/handling, submission of specimen other than nasopharyngeal swab, presence of viral mutation(s) within the areas targeted by this assay, and inadequate number of viral copies(<138 copies/mL). A negative result must be combined with clinical observations, patient history, and epidemiological information. The expected result is Negative.  Fact Sheet for Patients:  EntrepreneurPulse.com.au  Fact Sheet for Healthcare Providers:  IncredibleEmployment.be  This test is no t yet approved or cleared by the [place] FDA and  has been authorized for detection and/or diagnosis of SARS-CoV-2 by FDA under an Emergency Use Authorization (EUA). This EUA will remain  in effect (meaning this test can be used) for the duration of the COVID-19 declaration under Section 564(b)(1) of the Act, 21 U.S.C.section 360bbb-3(b)(1), unless the authorization is terminated  or revoked sooner.       Influenza A by PCR NEGATIVE NEGATIVE Final   Influenza B by PCR NEGATIVE NEGATIVE Final    Comment: (NOTE) The Xpert Xpress SARS-CoV-2/FLU/RSV plus assay is intended as an aid in the diagnosis of influenza from Nasopharyngeal swab specimens and should not be used as a sole basis for treatment. Nasal washings and aspirates are unacceptable for Xpert Xpress  SARS-CoV-2/FLU/RSV testing.  Fact Sheet for Patients: EntrepreneurPulse.com.au  Fact Sheet for Healthcare Providers: IncredibleEmployment.be  This test is not yet approved or cleared by the [place] FDA and has been authorized for detection and/or diagnosis of SARS-CoV-2 by FDA under an Emergency Use Authorization (EUA). This EUA will remain in effect (meaning this test can be used) for the duration of the COVID-19 declaration under Section 564(b)(1) of the Act, 21 U.S.C. section 360bbb-3(b)(1), unless the authorization is terminated or revoked.  Performed at [facility], [address], [place], [place] [address]          Radiology Studies: No results found.      Scheduled Meds:  alfuzosin  10 mg Oral Q breakfast   atorvastatin  10 mg Oral Daily   calcium carbonate  500 mg of elemental calcium Oral Q breakfast   cholecalciferol  2,000 Units Oral q AM   diltiazem  30 mg Oral Q6H   enoxaparin (LOVENOX) injection  40 mg Subcutaneous Q24H   ferrous sulfate  325 mg Oral Q breakfast   finasteride  5 mg Oral Daily   multivitamin with minerals  1 tablet Oral q AM   QUEtiapine  25 mg Oral QHS   tamsulosin  0.4 mg Oral Daily   torsemide  40 mg Oral Daily   Continuous Infusions:   LOS: 9 days    Enzo Bi, MD [facility]   To contact the attending provider between 7A-7P or the covering provider during after hours 7P-7A, please log into the web site [email or web address] and access using universal [place] password for that web site. If you do not have the password, please call the hospital operator.  01/12/2021, 7:56 PM

## 2021-01-13 LAB — BASIC METABOLIC PANEL
Anion gap: 7 (ref 5–15)
BUN: 23 mg/dL (ref 8–23)
CO2: 32 mmol/L (ref 22–32)
Calcium: 8.2 mg/dL — ABNORMAL LOW (ref 8.9–10.3)
Chloride: 100 mmol/L (ref 98–111)
Creatinine, Ser: 1.04 mg/dL (ref 0.61–1.24)
GFR, Estimated: >60 mL/min (ref >60)
Glucose, Bld: 111 mg/dL — ABNORMAL HIGH (ref 70–99)
Potassium: 3.7 mmol/L (ref 3.5–5.1)
Sodium: 139 mmol/L (ref 135–145)

## 2021-01-13 LAB — CBC
HCT: 32.3 % — ABNORMAL LOW (ref 39.0–52.0)
Hemoglobin: 10.4 g/dL — ABNORMAL LOW (ref 13.0–17.0)
MCH: 30.7 pg (ref 26.0–34.0)
MCHC: 32.2 g/dL (ref 30.0–36.0)
MCV: 95.3 fL (ref 80.0–100.0)
Platelets: 278 10*3/uL (ref 150–400)
RBC: 3.39 MIL/uL — ABNORMAL LOW (ref 4.22–5.81)
RDW: 14.5 % (ref 11.5–15.5)
WBC: 9.4 10*3/uL (ref 4.0–10.5)
nRBC: 0 % (ref 0.0–0.2)

## 2021-01-13 LAB — PROCALCITONIN: Procalcitonin: <0.1 ng/mL

## 2021-01-13 LAB — MAGNESIUM: Magnesium: 2.2 mg/dL (ref 1.7–2.4)

## 2021-01-13 LAB — RESP PANEL BY RT-PCR (FLU A&B, COVID) ARPGX2
Influenza A by PCR: NEGATIVE
Influenza B by PCR: NEGATIVE
SARS Coronavirus 2 by RT PCR: NEGATIVE

## 2021-01-13 LAB — BRAIN NATRIURETIC PEPTIDE: B Natriuretic Peptide: 185.6 pg/mL — ABNORMAL HIGH (ref 0.0–100.0)

## 2021-01-13 MED ORDER — POLYETHYLENE GLYCOL 3350 17 G PO PACK: 17.0000 g | PACK | Freq: Two times a day (BID) | ORAL | Status: DC | PRN

## 2021-01-13 MED ORDER — FUROSEMIDE 10 MG/ML IJ SOLN
40.0000 mg | Freq: Once | INTRAMUSCULAR | Status: AC
  Administered 2021-01-13: 40 mg via INTRAVENOUS
  Filled 2021-01-13: qty 4

## 2021-01-13 MED ORDER — FUROSEMIDE 10 MG/ML IJ SOLN
60.0000 mg | Freq: Once | INTRAMUSCULAR | Status: AC
  Administered 2021-01-13: 60 mg via INTRAVENOUS
  Filled 2021-01-13: qty 6

## 2021-01-13 MED ORDER — DOCUSATE SODIUM 100 MG PO CAPS: 100.0000 mg | ORAL_CAPSULE | Freq: Two times a day (BID) | ORAL | Status: DC | PRN

## 2021-01-13 MED ORDER — ENSURE ENLIVE PO LIQD
237.0000 mL | Freq: Three times a day (TID) | ORAL | Status: DC
  Administered 2021-01-13 – 2021-01-20 (×17): 237 mL via ORAL

## 2021-01-13 NOTE — Progress Notes (Signed)
Physical Therapy Treatment Patient Details Name: Christopher [name] MRN: [id number] DOB: 05/26/1928 Today's Date: 01/13/2021    History of Present Illness Pt is a 87 yo male s/p cystoscopy with fulguration and cryotherapy 6/13/022, experienced two syncopal episodes and hematuria, after discharging home, returned to [place]. 01/05/2021 underwent cystoscopy with clot evacuation and liberation.  He developed atrial fibrillation with rapid ventricular response and was placed on a diltiazem drip.  PMH of afib, bladder cancer, HLD, HTN, cardiac pacemaker, spinal cord stimulator    PT Comments    In chair, ready for session.  Participated in exercises as described below.  HR 90's - 110's during seated ex.  Stood with min a x 1 to RW.  Quickly commented on how unsteady he was but appears steady.  After a few moments he stated he was getting dizzy and HR noted to be 147.  Assisted to sitting.  BP 127/55 P 62 on dynamap.  Dizziness resolves with sitting and he remains upright in chair.  Communicated with Dr. [name] and RN via private chat.   Follow Up Recommendations  SNF;Supervision for mobility/OOB     Equipment Recommendations  3in1 (PT)    Recommendations for Other Services       Precautions / Restrictions Precautions Precautions: Fall Precaution Comments: Watch HR response Restrictions Weight Bearing Restrictions: No    Mobility  Bed Mobility               General bed mobility comments: in chair before and after    Transfers Overall transfer level: Needs assistance Equipment used: Rolling walker (2 wheeled) Transfers: Sit to/from Stand Sit to Stand: Min assist         General transfer comment: HR up to 147 with standing and c/o dizziness  Ambulation/Gait             General Gait Details: Unsafe at this time 2/2 to activity tolerance and response to just static standing .   Stairs             Wheelchair Mobility    Modified Rankin (Stroke Patients  Only)       Balance Overall balance assessment: Needs assistance Sitting-balance support: Feet supported Sitting balance-Leahy Scale: Good     Standing balance support: Bilateral upper extremity supported Standing balance-Leahy Scale: Fair Standing balance comment: heavy reliance on RW however steady in static standing. will assess dynamic balance in future sessions.                            Cognition Arousal/Alertness: Awake/alert Behavior During Therapy: WFL for tasks assessed/performed Overall Cognitive Status: Within Functional Limits for tasks assessed                                        Exercises Other Exercises Other Exercises: seated AROM x 10    General Comments        Pertinent Vitals/Pain Pain Assessment: No/denies pain    Home Living                      Prior Function            PT Goals (current goals can now be found in the care plan section) Progress towards PT goals: Not progressing toward goals - comment    Frequency    Min 2X/week  PT Plan Current plan remains appropriate    Co-evaluation              AM-PAC PT "6 Clicks" Mobility   Outcome Measure  Help needed turning from your back to your side while in a flat bed without using bedrails?: A Little Help needed moving from lying on your back to sitting on the side of a flat bed without using bedrails?: A Little Help needed moving to and from a bed to a chair (including a wheelchair)?: A Little Help needed standing up from a chair using your arms (e.g., wheelchair or bedside chair)?: A Little Help needed to walk in hospital room?: A Lot Help needed climbing 3-5 steps with a railing? : A Lot 6 Click Score: 16    End of Session Equipment Utilized During Treatment: Gait belt;Oxygen Activity Tolerance: Treatment limited secondary to medical complications (Comment) Patient left: in chair;with call bell/phone within reach;with chair  alarm set   PT Visit Diagnosis: Other abnormalities of gait and mobility (R26.89);Difficulty in walking, not elsewhere classified (R26.2);Muscle weakness (generalized) (M62.81)     Time: 0802-2336 PT Time Calculation (min) (ACUTE ONLY): 18 min  Charges:  $Therapeutic Exercise: 8-22 mins                    Chesley Noon, PTA 01/13/21, 1:04 PM , 1:01 PM

## 2021-01-13 NOTE — Care Management Important Message (Signed)
Important Message  Patient Details  Name: Christopher [name] MRN: [id number] Date of Birth: 02-01-1928   Medicare Important Message Given:  Yes     [name] 01/13/2021, 3:00 PM

## 2021-01-13 NOTE — Evaluation (Signed)
Clinical/Bedside Swallow Evaluation Patient Details  Name: Christopher [name] MRN: [id number] Date of Birth: 1927-09-24  Today's Date: 01/13/2021 Time: SLP Start Time (ACUTE ONLY): 4098 SLP Stop Time (ACUTE ONLY): 1545 SLP Time Calculation (min) (ACUTE ONLY): 60 min  Past Medical History:  Past Medical History:  Diagnosis Date   A-fib ([place])    Anemia    Arthritis    BACK   Bladder cancer ([facility])    BPH (benign prostatic hyperplasia)    Cancer ([facility])    skin cancer on face carcinoma   ED (erectile dysfunction)    Rosanna Randy disease    HLD (hyperlipidemia)    Hypertension    Macular degeneration    Mollusca contagiosa    Osteopenia    Presence of permanent cardiac pacemaker    Sensorineural hearing loss    Squamous cell carcinoma of skin 08/25/2019   R cheek    Squamous cell carcinoma of skin 02/12/2020   L neck    Squamous cell carcinoma of skin 11/30/2020   Right elbow, EDC   Past Surgical History:  Past Surgical History:  Procedure Laterality Date   CATARACT EXTRACTION Bilateral    CYSTOSCOPY WITH BIOPSY N/A 01/20/2020   Procedure: CYSTOSCOPY WITH BIOPSY;  Surgeon: Abbie Sons, MD;  Location: [facility];  Service: Urology;  Laterality: N/A;   CYSTOSCOPY WITH FULGERATION N/A 01/20/2020   Procedure: CYSTOSCOPY WITH FULGERATION;  Surgeon: Abbie Sons, MD;  Location: [facility];  Service: Urology;  Laterality: N/A;   CYSTOSCOPY WITH FULGERATION N/A 04/19/2020   Procedure: [place] with clot evacuation;  Surgeon: Abbie Sons, MD;  Location: [facility];  Service: Urology;  Laterality: N/A;   CYSTOSCOPY WITH FULGERATION N/A 01/05/2021   Procedure: CYSTOSCOPY WITH FULGERATION;  Surgeon: Abbie Sons, MD;  Location: [facility];  Service: Urology;  Laterality: N/A;   HEMORRHOIDECTOMY WITH HEMORRHOID BANDING     KNEE ARTHROSCOPY Left    PACEMAKER INSERTION N/A 03/03/2015   Procedure: INSERTION DUAL LEAD PACEMAKER;  Surgeon: Isaias Cowman, MD;  Location:  [facility];  Service: Cardiovascular;  Laterality: N/A;   PULSE GENERATOR IMPLANT N/A 10/18/2020   Procedure: UNILATERAL PULSE GENERATOR IMPLANT;  Surgeon: [name], MD;  Location: [facility];  Service: Neurosurgery;  Laterality: N/A;   SPINAL CORD STIMULATOR TRIAL N/A 10/11/2020   Procedure: PERCUTANEOUS SPINAL CORD STIMULATOR TRIAL;  Surgeon: [name], MD;  Location: [facility];  Service: Neurosurgery;  Laterality: N/A;   THORACIC LAMINECTOMY FOR SPINAL CORD STIMULATOR N/A 10/18/2020   Procedure: PERCUTANEOUS LEAD PLACEMENT;  Surgeon: [name], MD;  Location: [facility];  Service: Neurosurgery;  Laterality: N/A;   TONSILLECTOMY     TRANSURETHRAL RESECTION OF BLADDER TUMOR N/A 10/07/2019   Procedure: TRANSURETHRAL RESECTION OF BLADDER TUMOR (TURBT);  Surgeon: Abbie Sons, MD;  Location: [facility];  Service: Urology;  Laterality: N/A;   TRANSURETHRAL RESECTION OF BLADDER TUMOR N/A 04/13/2020   Procedure: TRANSURETHRAL RESECTION OF BLADDER TUMOR (TURBT);  Surgeon: Abbie Sons, MD;  Location: [facility];  Service: Urology;  Laterality: N/A;   HPI:  Pt is a 87 y.o. male with medical history significant for urothelial cancer of the bladder, history of atrial fibrillation on chronic anticoagulation therapy, hypertension, dyslipidemia who presents to the ER via EMS for evaluation of 2 witnessed syncopal episodes.  Patient had a cystoscopy with fulguration and cryosurgery on 12/20/20 for his known urothelial cancer of the bladder.  Per his daughter he was discharged home with a Foley catheter  that was removed 2 days later without any issues besides frequent urination.  Patient then developed hematuria and had passed multiple clots at home.  He had 2 syncopal episodes, one while walking to the bathroom and another witnessed by EMS as they tried to get into the stretcher.  He complains of feeling dizzy and lightheaded and complains of severe pain around the penile head.  Per labs, pt had a significant drop  in his H&H from 13.1g/dl a week ago to 8.9g/dl.  During his Lengthy hospitalization, pt is now being followed by Cardiology; Lasix being give for increased fluid retention.  CXR on 01/12/2021: "Patchy bilateral opacities suspicious for acute airspace  disease/probable pneumonia superimposed on underlying chronic  interstitial changes.  2. Borderline cardiac size.  Small pleural effusions.".  Per MD notes: Acute on chronic diastolic congestive heart failure; Paroxysmal atrial fibrillation with rapid ventricle response; Delirium, cont Seroquel at night.  Head CT: No acute intracranial abnormality seen.   Assessment / Plan / Recommendation Clinical Impression  Pt appears to present w/ adequate oropharyngeal phase swallow function w/ No oropharyngeal phase dysphagia noted, No neuromuscular deficits noted. Pt consumed po trials w/ No overt, clinical s/s of aspiration during po trials. Pt appears at reduced risk for aspiration following general aspiration precautions and monitoring his Pulmonary status, Fatigue during meals. Also discussed other Behavioral factors and changes that should be considered during oral intake/meals.    During po trials, pt consumed all consistencies w/ no overt coughing, decline in vocal quality, or change in respiratory presentation during/post trials. NO Straws were used/recommended. Oral phase appeared [facility] w/ timely bolus management, mastication, and control of bolus propulsion for A-P transfer for swallowing. Oral clearing achieved w/ all trial consistencies. OM Exam appeared [facility] w/ no unilateral weakness noted. Speech Clear. Pt fed self w/ min setup and Positioning support. Highlighted the importance of doing/continuing his breathing exs w/ use of Incentive Spirometer and Flutter Valve to improve his Pulmonary status.   Recommend continue a Regular consistency diet w/ well-Cut meats, moistened foods; considering food consistencies to choose easy to masticate/eat food less effortful  for conservation of energy during meals. Thin liquids VIA CUP - NO STRAW USE. Recommend general aspiration precautions, Rest breaks during meals; Pills WHOLE in Puree for safer, easier swallowing as pt described some "difficulty" swallowing Larger pills. Much Education given to pt and Daughter on Pills in Puree; food consistencies and easy to eat options; general aspiration precautions; behavioral changes to make during eating/drinking and at mealtime. Thoroughly discussed the impact of factors including advanced age, being a Male, extended illness, weakness overall, Pulmonary and Cardiac issues, and a little hospital delirium at times on swallowing. NSG to reconsult if any new needs arise. MD and NSG updated. Pt and Daughter had no further questions. Handouts given. SLP Visit Diagnosis: Dysphagia, unspecified (R13.10)    Aspiration Risk   (reduced following general precautions in light of extended illness, weakness)    Diet Recommendation  Regular consistency diet w/ well-Cut meats, moistened foods; considering food consistencies to choose easy to masticate/eat food less effortful for conservation of energy during meals. Thin liquids VIA CUP - NO STRAW USE. Recommend general aspiration precautions, Rest breaks during meals.  Medication Administration: Whole meds with puree (for safer swallowing)    Other  Recommendations Recommended Consults:  (Dietician f/u for supplement support) Oral Care Recommendations: Oral care BID;Patient independent with oral care Other Recommendations:  (n/a)   Follow up Recommendations None  Frequency and Duration  (n/a)   (n/a)       Prognosis Prognosis for Safe Diet Advancement: Good Barriers to Reach Goals:  (none) Barriers/Prognosis Comment: extended illness this admit w/ Cardiopulmonary issues in addition      Swallow Study   General Date of Onset: 01/03/21 HPI: Pt is a 87 y.o. male with medical history significant for urothelial cancer of the  bladder, history of atrial fibrillation on chronic anticoagulation therapy, hypertension, dyslipidemia who presents to the ER via EMS for evaluation of 2 witnessed syncopal episodes.  Patient had a cystoscopy with fulguration and cryosurgery on 12/20/20 for his known urothelial cancer of the bladder.  Per his daughter he was discharged home with a Foley catheter that was removed 2 days later without any issues besides frequent urination.  Patient then developed hematuria and had passed multiple clots at home.  He had 2 syncopal episodes, one while walking to the bathroom and another witnessed by EMS as they tried to get into the stretcher.  He complains of feeling dizzy and lightheaded and complains of severe pain around the penile head.  Per labs, pt had a significant drop in his H&H from 13.1g/dl a week ago to 8.9g/dl.  During his Lengthy hospitalization, pt is now being followed by Cardiology; Lasix being give for increased fluid retention.  CXR on 01/12/2021: "Patchy bilateral opacities suspicious for acute airspace  disease/probable pneumonia superimposed on underlying chronic  interstitial changes.  2. Borderline cardiac size.  Small pleural effusions.".  Per MD notes: Acute on chronic diastolic congestive heart failure; Paroxysmal atrial fibrillation with rapid ventricle response; Delirium, cont Seroquel at night.  Head CT: No acute intracranial abnormality seen. Type of Study: Bedside Swallow Evaluation Previous Swallow Assessment: none Diet Prior to this Study: Regular;Thin liquids Temperature Spikes Noted: No (wbc 9.4) Respiratory Status: Nasal cannula (3L) History of Recent Intubation: No Behavior/Cognition: Alert;Cooperative;Pleasant mood Oral Cavity Assessment: Within Functional Limits Oral Care Completed by SLP: Recent completion by staff Oral Cavity - Dentition: Adequate natural dentition Vision: Functional for self-feeding Self-Feeding Abilities: Able to feed self;Needs set up (positioning  too) Patient Positioning: Upright in bed (needed min cues and help to sit fully forward for oral intake in bed) Baseline Vocal Quality: Normal Volitional Cough: Strong Volitional Swallow: Able to elicit    Oral/Motor/Sensory Function Overall Oral Motor/Sensory Function: Within functional limits   Ice Chips Ice chips: Within functional limits Presentation: Spoon (fed; 2 trials)   Thin Liquid Thin Liquid: Within functional limits Presentation: Cup;Self Fed (10+ trials) Other Comments: single, small sips encouraged -- slow down and No straws    Nectar Thick Nectar Thick Liquid: Not tested   Honey Thick Honey Thick Liquid: Not tested   Puree Puree: Within functional limits Presentation: Self Fed;Spoon (~3 ozs)   Solid     Solid: Within functional limits Presentation: Self Fed (4 trials)        Orinda Kenner, MS, CCC-SLP Speech Language Pathologist Rehab Services [phone] [facility] 01/13/2021,4:24 PM

## 2021-01-13 NOTE — Progress Notes (Signed)
PROGRESS NOTE    Christopher [name]  KGU:[id number] DOB: 26-Jan-1928 DOA: 01/03/2021 PCP: [name], MD   Chief complaint.  Gross hematuria. Brief Narrative:  Christopher [name] is a 87 y.o. male with medical history significant for urothelial cancer of the bladder, history of atrial fibrillation on chronic anticoagulation therapy, hypertension, dyslipidemia who presents to the ER via EMS for evaluation of 2 witnessed syncopal episodes. Patient recently had cystoscopy with fulguration and cryosurgery for bladder cancer on 6/13.  He had a significant gross hematuria with a large amount of blood clots.  Evaluated by urology, started CBI. Cystoscopy and clot evacuation performed on 6/29. Developed rapid ventricular response with atrial fibrillation on 6/30.  Started on diltiazem drip, then transitioned to oral diltiazem.     Assessment & Plan:   Principal Problem:   Acute blood loss anemia Active Problems:   HTN (hypertension)   Benign prostatic hyperplasia with urinary frequency   Persistent atrial fibrillation (HCC)   Urothelial carcinoma of bladder (HCC)   Gross hematuria   Acute renal failure (ARF) (HCC)   Acute hypoxemic respiratory failure ([name])   Delirium  #1.  Delirium, resolved --cont seroquel 25 mg nightly  2.  Acute blood loss anemia. Hypovolemic shock. Iron deficient anemia. Gross hematuria secondary to bladder malignancy. status post cystoscopy and clot evacuation.   He received IV iron, 3u blood transfusion.   --monitor Hgb --cont to hold Eliquis --cont iron suppl  Acute hypoxic respiratory failure Worsening dyspnea --on 3L O2 --CXR showed Patchy bilateral opacities.  BNP mildly elevated.  Procal neg, no fever or leukocytosis.  Repeat covid neg. Plan: --try more aggressive diuresis with IV lasix  --Continue supplemental O2 to keep sats >=92%, wean as tolerated  Acute on chronic diastolic congestive heart failure. --try more aggressive diuresis with  IV lasix  --Hold torsemide  4.  Paroxysmal atrial fibrillation with rapid ventricle response. --currently rate controlled --home Toprol on hold Plan: --cont dilt 30 mg q6h --cont to hold Eliquis  #5.  Acute kidney injury secondary to obstruction. Hyponatremia  Hyperkalemia. Condition all resolved after cystoscopy.  6.  General weakness. --PT/OT --discharge to SNF   DVT prophylaxis: Lovenox Code Status: full Family Communication: daughters updated at bedside today Disposition Plan:    Status is: Inpatient  Remains inpatient appropriate because:Inpatient level of care appropriate due to severity of illness  Dispo: The patient is from: Home              Anticipated d/c is to: SNF              Patient currently is not medically stable to d/c.    Difficult to place patient No   I/O last 3 completed shifts: In: 0  Out: 1950 [Urine:1950] Total I/O In: 68 [P.O.:480] Out: 150 [Urine:150]     Consultants:  Cardiology  Procedures: None  Antimicrobials: None   Subjective: Continues to have cough with deep breathing.  Family and RN also concerned about pt coughing with drinking liquids.     Objective: Vitals:   01/13/21 0504 01/13/21 0735 01/13/21 1109 01/13/21 1531  BP: 119/78 110/64 115/80 123/64  Pulse: 76 86 61 65  Resp: 20 19 18 18   Temp: 98.1 F (36.7 C) (!) 97.5 F (36.4 C) 97.9 F (36.6 C) 97.6 F (36.4 C)  TempSrc: Oral Axillary Oral Oral  SpO2: 96% 99% 91% 98%  Weight:      Height:        Intake/Output Summary (Last  24 hours) at 01/13/2021 1656 Last data filed at 01/13/2021 1531 Gross per 24 hour  Intake 480 ml  Output 700 ml  Net -220 ml   Filed Weights   01/03/21 1253 01/03/21 2312 01/06/21 0429  Weight: 88.5 kg 90 kg 89.5 kg    Examination:  Constitutional: NAD, AAOx3 HEENT: conjunctivae and lids normal, EOMI CV: No cyanosis.   RESP: cough with big breaths, on 3L Extremities: No effusions, edema in BLE SKIN: warm, dry Neuro:  II - XII grossly intact.   Psych: Normal mood and affect.  Appropriate judgement and reason   Data Reviewed: I have personally reviewed following labs and imaging studies  CBC: Recent Labs  Lab 01/08/21 0517 01/08/21 1708 01/09/21 0641 01/10/21 0602 01/11/21 0718 01/12/21 0545 01/13/21 0708  WBC 8.3  --  10.3 13.7* 11.6* 10.3 9.4  NEUTROABS 5.9  --  7.6 10.6* 8.7* 7.4  --   HGB 8.0*   < > 7.7* 9.6* 9.9* 9.9* 10.4*  HCT 24.4*   < > 23.2* 29.0* 31.0* 30.6* 32.3*  MCV 95.3  --  95.5 94.8 97.2 97.1 95.3  PLT 198  --  201 222 242 250 278   < > = values in this interval not displayed.   Basic Metabolic Panel: Recent Labs  Lab 01/09/21 0641 01/10/21 0602 01/11/21 0718 01/12/21 0545 01/13/21 0708  NA 137 139 138 139 139  K 3.5 3.8 3.7 3.6 3.7  CL 102 103 102 100 100  CO2 29 29 31 29  32  GLUCOSE 96 110* 101* 100* 111*  BUN 25* 24* 22 24* 23  CREATININE 1.02 1.07 1.07 1.05 1.04  CALCIUM 8.1* 8.0* 8.1* 7.9* 8.2*  MG 2.2 2.3 2.3 2.4 2.2  PHOS  --  2.9 2.8  --   --    GFR: Estimated Creatinine Clearance: 50.3 mL/min (by C-G formula based on SCr of 1.04 mg/dL). Liver Function Tests: No results for input(s): AST, ALT, ALKPHOS, BILITOT, PROT, ALBUMIN in the last 168 hours. No results for input(s): LIPASE, AMYLASE in the last 168 hours. No results for input(s): AMMONIA in the last 168 hours. Coagulation Profile: No results for input(s): INR, PROTIME in the last 168 hours. Cardiac Enzymes: No results for input(s): CKTOTAL, CKMB, CKMBINDEX, TROPONINI in the last 168 hours. BNP (last 3 results) No results for input(s): PROBNP in the last 8760 hours. HbA1C: No results for input(s): HGBA1C in the last 72 hours. CBG: No results for input(s): GLUCAP in the last 168 hours. Lipid Profile: No results for input(s): CHOL, HDL, LDLCALC, TRIG, CHOLHDL, LDLDIRECT in the last 72 hours. Thyroid Function Tests: No results for input(s): TSH, T4TOTAL, FREET4, T3FREE, THYROIDAB in the last 72  hours. Anemia Panel: No results for input(s): VITAMINB12, FOLATE, FERRITIN, TIBC, IRON, RETICCTPCT in the last 72 hours. Sepsis Labs: Recent Labs  Lab 01/13/21 0708  PROCALCITON <0.10    Recent Results (from the past 240 hour(s))  Resp Panel by RT-PCR (Flu A&B, Covid) Nasopharyngeal Swab     Status: None   Collection Time: 01/13/21  3:50 PM   Specimen: Nasopharyngeal Swab; Nasopharyngeal(NP) swabs in vial transport medium  Result Value Ref Range Status   SARS Coronavirus 2 by RT PCR NEGATIVE NEGATIVE Final    Comment: (NOTE) SARS-CoV-2 target nucleic acids are NOT DETECTED.  The SARS-CoV-2 RNA is generally detectable in upper respiratory specimens during the acute phase of infection. The lowest concentration of SARS-CoV-2 viral copies this assay can detect is 138 copies/mL. A negative  result does not preclude SARS-Cov-2 infection and should not be used as the sole basis for treatment or other patient management decisions. A negative result may occur with  improper specimen collection/handling, submission of specimen other than nasopharyngeal swab, presence of viral mutation(s) within the areas targeted by this assay, and inadequate number of viral copies(<138 copies/mL). A negative result must be combined with clinical observations, patient history, and epidemiological information. The expected result is Negative.  Fact Sheet for Patients:  EntrepreneurPulse.com.au  Fact Sheet for Healthcare Providers:  IncredibleEmployment.be  This test is no t yet approved or cleared by the [place] FDA and  has been authorized for detection and/or diagnosis of SARS-CoV-2 by FDA under an Emergency Use Authorization (EUA). This EUA will remain  in effect (meaning this test can be used) for the duration of the COVID-19 declaration under Section 564(b)(1) of the Act, 21 U.S.C.section 360bbb-3(b)(1), unless the authorization is terminated  or revoked  sooner.       Influenza A by PCR NEGATIVE NEGATIVE Final   Influenza B by PCR NEGATIVE NEGATIVE Final    Comment: (NOTE) The Xpert Xpress SARS-CoV-2/FLU/RSV plus assay is intended as an aid in the diagnosis of influenza from Nasopharyngeal swab specimens and should not be used as a sole basis for treatment. Nasal washings and aspirates are unacceptable for Xpert Xpress SARS-CoV-2/FLU/RSV testing.  Fact Sheet for Patients: EntrepreneurPulse.com.au  Fact Sheet for Healthcare Providers: IncredibleEmployment.be  This test is not yet approved or cleared by the [place] FDA and has been authorized for detection and/or diagnosis of SARS-CoV-2 by FDA under an Emergency Use Authorization (EUA). This EUA will remain in effect (meaning this test can be used) for the duration of the COVID-19 declaration under Section 564(b)(1) of the Act, 21 U.S.C. section 360bbb-3(b)(1), unless the authorization is terminated or revoked.  Performed at [facility], [address], [place], [place] [address]          Radiology Studies: DG Chest 2 View  Result Date: 01/12/2021 CLINICAL DATA:  Dyspnea EXAM: CHEST - 2 VIEW COMPARISON:  05/04/2020 FINDINGS: Left-sided pacing device with leads over the right atrium and right ventricle. Borderline cardiomegaly with aortic atherosclerosis. Coarse chronic interstitial disease. Patchy bilateral airspace opacities suspicious for superimposed pneumonia. No pneumothorax. Small pleural effusions on lateral view. IMPRESSION: 1. Patchy bilateral opacities suspicious for acute airspace disease/probable pneumonia superimposed on underlying chronic interstitial changes. 2. Borderline cardiac size.  Small pleural effusions. Electronically Signed   By: Donavan Foil M.D.   On: 01/12/2021 20:44        Scheduled Meds:  alfuzosin  10 mg Oral Q breakfast   atorvastatin  10 mg Oral Daily   calcium carbonate  500 mg of  elemental calcium Oral Q breakfast   cholecalciferol  2,000 Units Oral q AM   diltiazem  30 mg Oral Q6H   enoxaparin (LOVENOX) injection  40 mg Subcutaneous Q24H   feeding supplement  237 mL Oral TID BM   ferrous sulfate  325 mg Oral Q breakfast   finasteride  5 mg Oral Daily   multivitamin with minerals  1 tablet Oral q AM   QUEtiapine  25 mg Oral QHS   tamsulosin  0.4 mg Oral Daily   Continuous Infusions:   LOS: 10 days    Enzo Bi, MD [facility]   To contact the attending provider between 7A-7P or the covering provider during after hours 7P-7A, please log into the web site [email or web address] and access using  universal [place] password for that web site. If you do not have the password, please call the hospital operator.  01/13/2021, 4:56 PM

## 2021-01-13 NOTE — Progress Notes (Signed)
Occupational Therapy Treatment Patient Details Name: Christopher [name] MRN: [id number] DOB: [birth year] Today's Date: 01/13/2021    History of present illness Pt is a 87 yo male s/p cystoscopy with fulguration and cryotherapy 6/13/022, experienced two syncopal episodes and hematuria, after discharging home, returned to [place]. 01/05/2021 underwent cystoscopy with clot evacuation and liberation.  He developed atrial fibrillation with rapid ventricular response and was placed on a diltiazem drip.  PMH of afib, bladder cancer, HLD, HTN, cardiac pacemaker, spinal cord stimulator   OT comments  Pt seen for OT tx this date to f/u re: safety with ADLs/ADL mobility. Pt reports feeling better than he did during PT session earlier today. Pt with decreased instance of dizziness and while HR still elevated with even light activity, slightly less dramatic increase this session to upper 120s with transfers. RN notified. OT engages pt in sup to sit with MIN A. Pt demos G sitting balance EOB. Pt's bed noted to be wet. OT assists pt in CTS with RW with cues for hand placement and MIN A on initial trial. Linens changed and pt assisted with peri care in standing with RW with MOD A. Pt able to take ~4-5 steps to recliner and while seated, completes UB bathing and dressing with MIN A. Pt requires CGA on subsequent stand trial and CGA to step back to bed. Pt able to lift LE back to bed with SUPV. Pt left in bed with all needs met and in reach. Dtr present throughout session. While pt did progress this session with OT, still currently anticipating he will require STR f/u as he is still greatly deconditioned with limited standing tolerance and limited tolerance for HH distances to perform basic self care tasks such as toileting/bathing.    Follow Up Recommendations  SNF;Supervision/Assistance - 24 hour    Equipment Recommendations  Other (comment)    Recommendations for Other Services      Precautions / Restrictions  Precautions Precautions: Fall Precaution Comments: Watch HR response Restrictions Weight Bearing Restrictions: No       Mobility Bed Mobility Overal bed mobility: Needs Assistance Bed Mobility: Sit to Supine;Supine to Sit     Supine to sit: HOB elevated;Min assist Sit to supine: Supervision   General bed mobility comments: improved ability to assist with LEs back to bed    Transfers Overall transfer level: Needs assistance Equipment used: Rolling walker (2 wheeled) Transfers: Sit to/from Stand Sit to Stand: Min assist;Min guard         General transfer comment: with RW, increased time, cue for hand placement. Pt with improved HR to ~120s with light activity, and decreased c/o dizziness    Balance Overall balance assessment: Needs assistance Sitting-balance support: Feet supported Sitting balance-Leahy Scale: Good Sitting balance - Comments: no LOB in sitting Postural control: Posterior lean Standing balance support: Bilateral upper extremity supported Standing balance-Leahy Scale: Fair Standing balance comment: UE support on RW, able to alternate for ADLs.                           ADL either performed or assessed with clinical judgement   ADL Overall ADL's : Needs assistance/impaired         Upper Body Bathing: Minimal assistance;Sitting Upper Body Bathing Details (indicate cue type and reason): MIN A to wash hair in sitting Lower Body Bathing: Minimal assistance;Moderate assistance;Sit to/from stand Lower Body Bathing Details (indicate cue type and reason): peri care Upper Body Dressing : Set  up;Sitting Upper Body Dressing Details (indicate cue type and reason): to don gown to front side                 Functional mobility during ADLs: Min guard;Minimal assistance;Rolling walker (to take steps to/from recliner/bed)       Vision Patient Visual Report: No change from baseline     Perception     Praxis      Cognition  Arousal/Alertness: Awake/alert Behavior During Therapy: WFL for tasks assessed/performed Overall Cognitive Status: Within Functional Limits for tasks assessed                                          Exercises Other Exercises Other Exercises: seated postural extension with scap retraction x10 with 2-3 second hold and deep breath to improve lung function, encourage mobilization of secretions, expand surface aread for quality of breaths.   Shoulder Instructions       General Comments      Pertinent Vitals/ Pain       Pain Assessment: No/denies pain  Home Living                                          Prior Functioning/Environment              Frequency  Min 2X/week        Progress Toward Goals  OT Goals(current goals can now be found in the care plan section)  Progress towards OT goals: Progressing toward goals  Acute Rehab OT Goals Patient Stated Goal: to get stronger OT Goal Formulation: With patient Time For Goal Achievement: 01/25/21 Potential to Achieve Goals: Good  Plan Discharge plan remains appropriate    Co-evaluation                 AM-PAC OT "6 Clicks" Daily Activity     Outcome Measure   Help from another person eating meals?: None Help from another person taking care of personal grooming?: A Little Help from another person toileting, which includes using toliet, bedpan, or urinal?: A Lot Help from another person bathing (including washing, rinsing, drying)?: A Lot Help from another person to put on and taking off regular upper body clothing?: A Little Help from another person to put on and taking off regular lower body clothing?: A Lot 6 Click Score: 16    End of Session Equipment Utilized During Treatment: Oxygen  OT Visit Diagnosis: Unsteadiness on feet (R26.81);Repeated falls (R29.6);Muscle weakness (generalized) (M62.81)   Activity Tolerance Patient tolerated treatment well   Patient Left with  call bell/phone within reach;in bed;with bed alarm set;with family/visitor present (dtr present)   Nurse Communication Other (comment) (notified of HR and pt back to bed with male purewick at end of session)        Time: 2992-4268 OT Time Calculation (min): 54 min  Charges: OT General Charges $OT Visit: 1 Visit OT Treatments $Self Care/Home Management : 23-37 mins $Therapeutic Activity: 23-37 mins  Gerrianne Scale, [place], OTR/L ascom [phone] 01/13/21, 6:19 PM

## 2021-01-14 LAB — CBC
HCT: 31.4 % — ABNORMAL LOW (ref 39.0–52.0)
Hemoglobin: 10.1 g/dL — ABNORMAL LOW (ref 13.0–17.0)
MCH: 31.2 pg (ref 26.0–34.0)
MCHC: 32.2 g/dL (ref 30.0–36.0)
MCV: 96.9 fL (ref 80.0–100.0)
Platelets: 294 10*3/uL (ref 150–400)
RBC: 3.24 MIL/uL — ABNORMAL LOW (ref 4.22–5.81)
RDW: 14.5 % (ref 11.5–15.5)
WBC: 10 10*3/uL (ref 4.0–10.5)
nRBC: 0 % (ref 0.0–0.2)

## 2021-01-14 LAB — BASIC METABOLIC PANEL
Anion gap: 7 (ref 5–15)
BUN: 24 mg/dL — ABNORMAL HIGH (ref 8–23)
CO2: 32 mmol/L (ref 22–32)
Calcium: 8.3 mg/dL — ABNORMAL LOW (ref 8.9–10.3)
Chloride: 99 mmol/L (ref 98–111)
Creatinine, Ser: 1.06 mg/dL (ref 0.61–1.24)
GFR, Estimated: >60 mL/min (ref >60)
Glucose, Bld: 109 mg/dL — ABNORMAL HIGH (ref 70–99)
Potassium: 3.5 mmol/L (ref 3.5–5.1)
Sodium: 138 mmol/L (ref 135–145)

## 2021-01-14 LAB — ALBUMIN: Albumin: 2.2 g/dL — ABNORMAL LOW (ref 3.5–5.0)

## 2021-01-14 LAB — MAGNESIUM: Magnesium: 2.1 mg/dL (ref 1.7–2.4)

## 2021-01-14 MED ORDER — FUROSEMIDE 10 MG/ML IJ SOLN
60.0000 mg | Freq: Two times a day (BID) | INTRAMUSCULAR | Status: AC
  Administered 2021-01-14 (×2): 60 mg via INTRAVENOUS
  Filled 2021-01-14 (×2): qty 6

## 2021-01-14 MED ORDER — METOLAZONE 5 MG PO TABS
5.0000 mg | ORAL_TABLET | Freq: Once | ORAL | Status: AC
  Administered 2021-01-14: 5 mg via ORAL
  Filled 2021-01-14 (×2): qty 1

## 2021-01-14 MED ORDER — IPRATROPIUM-ALBUTEROL 0.5-2.5 (3) MG/3ML IN SOLN: 3.0000 mL | Freq: Three times a day (TID) | RESPIRATORY_TRACT | Status: DC

## 2021-01-14 MED ORDER — IPRATROPIUM-ALBUTEROL 0.5-2.5 (3) MG/3ML IN SOLN
3.0000 mL | Freq: Three times a day (TID) | RESPIRATORY_TRACT | Status: DC
  Administered 2021-01-14 – 2021-01-15 (×3): 3 mL via RESPIRATORY_TRACT
  Filled 2021-01-14 (×2): qty 3

## 2021-01-14 NOTE — Progress Notes (Addendum)
PT also mentioned past acquaintance with priest (Fr. Eddie Dibbles) but did not request him as of 7/8.  01/14/21 1335  Clinical Encounter Type  Visited With Patient  Visit Type Initial;Spiritual support;Social support  Referral From Other (Comment) (rounding)  Spiritual Encounters  Spiritual Needs Other (Comment) (social)  Chaplain Burris visited with Christopher [name] who was sitting up in bed. PT shared experiences since initial hospitalization and Chaplain Burris provided reflective listening. We discussed his Catholic faith and chaplain offered that her support extended to include PT's whole well-being, including emotions that arise due to changes in health. PT indicated good support from daughters and appreciated chaplain's visit.

## 2021-01-14 NOTE — Progress Notes (Signed)
Physical Therapy Treatment Patient Details Name: LARICO DIMOCK MRN: [id number] DOB: Nov 14, 1927 Today's Date: 01/14/2021    History of Present Illness Pt is a 87 yo male s/p cystoscopy with fulguration and cryotherapy 6/13/022, experienced two syncopal episodes and hematuria, after discharging home, returned to [place]. 01/05/2021 underwent cystoscopy with clot evacuation and liberation.  He developed atrial fibrillation with rapid ventricular response and was placed on a diltiazem drip.  PMH of afib, bladder cancer, HLD, HTN, cardiac pacemaker, spinal cord stimulator    PT Comments    Pt alert, motivated with PT. Pt notes primary limitation is SOB with exertion, currently on 3L O2 [place]. Improvement in bed mobility and transfers requiring min-guard for safety. RW for transfers bed <>chair and all standing activity with min-guard.  Heart rate remains elevated with minimal activity, sitting marching HR 110-150s, SpO2 98-100%. Standing activity HR 110-120 but pt noted sx of dizziness. Pt instructed to sit down and nursing listened to heart and notified medical team. All further activity abstained due to changes in symptoms and elevated HR. Skilled PT intervention is indicated to address deficits in function, mobility, and to return to PLOF as able.  Discharge recommendations remain SNF w/ supervision.   Follow Up Recommendations  SNF;Supervision for mobility/OOB     Equipment Recommendations  3in1 (PT)    Recommendations for Other Services       Precautions / Restrictions Precautions Precautions: Fall Precaution Comments: Watch HR with all activity Restrictions Weight Bearing Restrictions: No    Mobility  Bed Mobility Overal bed mobility: Needs Assistance Bed Mobility: Supine to Sit     Supine to sit: HOB elevated;Min guard          Transfers Overall transfer level: Needs assistance Equipment used: Rolling walker (2 wheeled) Transfers: Sit to/from Stand Sit to Stand: Min  guard         General transfer comment: Standing HR: 110s, SpO2: 98%, pt noted sx of dizziness, pt instructed to sit  Ambulation/Gait Ambulation/Gait assistance: Min guard Gait Distance (Feet): 2 Feet Assistive device: Rolling walker (2 wheeled)       General Gait Details: Bed > Chair w/ RW, no further activity 2ndary to decreased exercice tolerance w/ static stance and stepping and elevated HR   Stairs             Wheelchair Mobility    Modified Rankin (Stroke Patients Only)       Balance Overall balance assessment: Needs assistance Sitting-balance support: Feet supported Sitting balance-Leahy Scale: Good Sitting balance - Comments: cues for trunk ext   Standing balance support: Bilateral upper extremity supported Standing balance-Leahy Scale: Fair Standing balance comment: Requires BUE due to decreased exercise tolerance                            Cognition Arousal/Alertness: Awake/alert Behavior During Therapy: WFL for tasks assessed/performed Overall Cognitive Status: Within Functional Limits for tasks assessed                                 General Comments: pleasant, motivated      Exercises General Exercises - Lower Extremity Long Arc Quad: AROM;10 reps;Seated Other Exercises Other Exercises: seated hip abd, add, LAQ with pt education on minimizing contraction effort, cues for breathing    General Comments General comments (skin integrity, edema, etc.): Seated marches 10s HR 110s -150s, nursing listened to heart,  care team notified.      Pertinent Vitals/Pain Pain Assessment: No/denies pain Pain Intervention(s): Monitored during session    Home Living                      Prior Function            PT Goals (current goals can now be found in the care plan section) Acute Rehab PT Goals Patient Stated Goal: to get stronger PT Goal Formulation: With patient Time For Goal Achievement: 01/21/21 Potential  to Achieve Goals: Good Progress towards PT goals: Not progressing toward goals - comment (Decreased activity tolerance due to medical changes)    Frequency    Min 2X/week      PT Plan Current plan remains appropriate    Co-evaluation              AM-PAC PT "6 Clicks" Mobility   Outcome Measure  Help needed turning from your back to your side while in a flat bed without using bedrails?: A Little Help needed moving from lying on your back to sitting on the side of a flat bed without using bedrails?: A Little Help needed moving to and from a bed to a chair (including a wheelchair)?: A Little Help needed standing up from a chair using your arms (e.g., wheelchair or bedside chair)?: A Little Help needed to walk in hospital room?: A Lot Help needed climbing 3-5 steps with a railing? : A Lot 6 Click Score: 16    End of Session Equipment Utilized During Treatment: Gait belt;Oxygen Activity Tolerance: Treatment limited secondary to medical complications (Comment) (unstable heart rate, decreased exercise tolerance) Patient left: in chair;with call bell/phone within reach;with chair alarm set;with nursing/sitter in room (nursing) Nurse Communication: Mobility status;Other (comment) (dizziness with static stance) PT Visit Diagnosis: Other abnormalities of gait and mobility (R26.89);Difficulty in walking, not elsewhere classified (R26.2);Muscle weakness (generalized) (M62.81)     Time: 4967-5916 PT Time Calculation (min) (ACUTE ONLY): 28 min  Charges:  $Therapeutic Exercise: 23-37 mins                     [employer], SPT

## 2021-01-14 NOTE — Progress Notes (Signed)
Patient HR jumped up to 150-160 while working with PT.   Assessed patient- did complain of some dizziness and worsening SOB.  Otherwise, denies chest pain or chest tightness.    Paged Primary MD and Cardiology.   Both MDs aware- will check and possibly adjust medications

## 2021-01-14 NOTE — Progress Notes (Signed)
Mobility Specialist - Progress Note   01/14/21 1200  Mobility  Activity Sat and stood x 3  Range of Motion/Exercises Right leg;Left leg (SLR, ABD, Seated March)  Level of Assistance Minimal assist, patient does 75% or more  Geographical information systems officer  Distance Ambulated (ft) 0 ft  Mobility Response Tolerated well  Mobility performed by Mobility specialist  $Mobility charge 1 Mobility    Pt sitting in recliner upon arrival, family at bedside. Pt participated in seated therex: ankle pumps, straight leg raises, abduction, and seated march. HR ranging 67-108 throughout session with A-fib pattern. Pt performed STSx3 with minA and RW. Does voice dizziness and SOB with upright position. Utilizing 3L. O2 maintained in the upper 90s. PLB engaged for activity. BP measurement 124/61. Pt left in recliner with alarm set. Lunch arrival at exit, placed in reach.    Kathee Delton Mobility Specialist 01/14/21, 12:55 PM

## 2021-01-14 NOTE — Progress Notes (Signed)
PROGRESS NOTE    Christopher [name]  YWV:[id number] DOB: 1927/08/27 DOA: 01/03/2021 PCP: [name], MD   Chief complaint.  Gross hematuria. Brief Narrative:  Christopher [name] is a 87 y.o. male with medical history significant for urothelial cancer of the bladder, history of atrial fibrillation on chronic anticoagulation therapy, hypertension, dyslipidemia who presents to the ER via EMS for evaluation of 2 witnessed syncopal episodes. Patient recently had cystoscopy with fulguration and cryosurgery for bladder cancer on 6/13.  He had a significant gross hematuria with a large amount of blood clots.  Evaluated by urology, started CBI. Cystoscopy and clot evacuation performed on 6/29. Developed rapid ventricular response with atrial fibrillation on 6/30.  Started on diltiazem drip, then transitioned to oral diltiazem.     Assessment & Plan:   Principal Problem:   Acute blood loss anemia Active Problems:   HTN (hypertension)   Benign prostatic hyperplasia with urinary frequency   Persistent atrial fibrillation (HCC)   Urothelial carcinoma of bladder (HCC)   Gross hematuria   Acute renal failure (ARF) (HCC)   Acute hypoxemic respiratory failure ([place])   Delirium  #1.  Delirium, resolved --cont seroquel 25 mg nightly  2.  Acute blood loss anemia. Hypovolemic shock. Iron deficient anemia. Gross hematuria secondary to bladder malignancy. status post cystoscopy and clot evacuation.   He received IV iron, 3u blood transfusion.   --Hgb stable in 9-10's Plan: --cont to hold Eliquis --cont iron suppl  Acute hypoxic respiratory failure Worsening dyspnea --on 3L O2 --CXR showed Patchy bilateral opacities.  BNP mildly elevated.  Procal neg, no fever or leukocytosis to suggest PNA.  Repeat covid neg. Plan: --IV lasix 40 mg BID plus metolazone today --Strict I/O --DuoNeb TID --Incentive spirometry  --Continue supplemental O2 to keep sats >=92%, wean as tolerated  Acute on  chronic diastolic congestive heart failure. --more aggressive diuresis as above --Hold torsemide  Paroxysmal atrial fibrillation with rapid ventricle response. --currently rate controlled --home Toprol was not started on admission.  Dilt ordered by cardio. Plan: --cont dilt 30 mg q6h --cont to hold Eliquis --cont to hold home Toprol since BP soft   Acute kidney injury secondary to obstruction. Hyponatremia  Hyperkalemia. Condition all resolved after cystoscopy.  General weakness. --PT/OT --discharge to SNF   DVT prophylaxis: Lovenox Code Status: full Family Communication: daughter updated at bedside today Disposition Plan:    Status is: Inpatient  Remains inpatient appropriate because:Inpatient level of care appropriate due to severity of illness  Dispo: The patient is from: Home              Anticipated d/c is to: SNF              Patient currently is not medically stable to d/c.  IV lasix    Difficult to place patient No   I/O last 3 completed shifts: In: 870 [P.O.:870] Out: 1950 [Urine:1950] Total I/O In: -  Out: 100 [Urine:100]     Consultants:  Cardiology  Procedures: None  Antimicrobials: None   Subjective: Pt complained of dyspnea on minimum exertion, but not at rest.  HR went up with just standing up.  Pt couldn't tell me whether he had good urine output.   Objective: Vitals:   01/14/21 0937 01/14/21 1111 01/14/21 1323 01/14/21 1647  BP:  (!) 116/52  127/69  Pulse:  69  76  Resp:  18  16  Temp:  97.8 F (36.6 C)  (!) 97.5 F (36.4 C)  TempSrc:  Oral  Oral  SpO2:  100% 96% 100%  Weight: 81.2 kg     Height:        Intake/Output Summary (Last 24 hours) at 01/14/2021 1724 Last data filed at 01/14/2021 1316 Gross per 24 hour  Intake 390 ml  Output 1600 ml  Net -1210 ml   Filed Weights   01/03/21 2312 01/06/21 0429 01/14/21 0937  Weight: 90 kg 89.5 kg 81.2 kg    Examination:  Constitutional: NAD, AAOx3 HEENT: conjunctivae and  lids normal, EOMI CV: No cyanosis.   RESP: some mild crackles, better air movement, on 3L Extremities: No effusions, edema in BLE SKIN: warm, dry Neuro: II - XII grossly intact.   Psych: Normal mood and affect.  Appropriate judgement and reason   Data Reviewed: I have personally reviewed following labs and imaging studies  CBC: Recent Labs  Lab 01/08/21 0517 01/08/21 1708 01/09/21 0641 01/10/21 0602 01/11/21 0718 01/12/21 0545 01/13/21 0708 01/14/21 0522  WBC 8.3  --  10.3 13.7* 11.6* 10.3 9.4 10.0  NEUTROABS 5.9  --  7.6 10.6* 8.7* 7.4  --   --   HGB 8.0*   < > 7.7* 9.6* 9.9* 9.9* 10.4* 10.1*  HCT 24.4*   < > 23.2* 29.0* 31.0* 30.6* 32.3* 31.4*  MCV 95.3  --  95.5 94.8 97.2 97.1 95.3 96.9  PLT 198  --  201 222 242 250 278 294   < > = values in this interval not displayed.   Basic Metabolic Panel: Recent Labs  Lab 01/10/21 0602 01/11/21 0718 01/12/21 0545 01/13/21 0708 01/14/21 0522  NA 139 138 139 139 138  K 3.8 3.7 3.6 3.7 3.5  CL 103 102 100 100 99  CO2 29 31 29  32 32  GLUCOSE 110* 101* 100* 111* 109*  BUN 24* 22 24* 23 24*  CREATININE 1.07 1.07 1.05 1.04 1.06  CALCIUM 8.0* 8.1* 7.9* 8.2* 8.3*  MG 2.3 2.3 2.4 2.2 2.1  PHOS 2.9 2.8  --   --   --    GFR: Estimated Creatinine Clearance: 49.3 mL/min (by C-G formula based on SCr of 1.06 mg/dL). Liver Function Tests: Recent Labs  Lab 01/14/21 0522  ALBUMIN 2.2*   No results for input(s): LIPASE, AMYLASE in the last 168 hours. No results for input(s): AMMONIA in the last 168 hours. Coagulation Profile: No results for input(s): INR, PROTIME in the last 168 hours. Cardiac Enzymes: No results for input(s): CKTOTAL, CKMB, CKMBINDEX, TROPONINI in the last 168 hours. BNP (last 3 results) No results for input(s): PROBNP in the last 8760 hours. HbA1C: No results for input(s): HGBA1C in the last 72 hours. CBG: No results for input(s): GLUCAP in the last 168 hours. Lipid Profile: No results for input(s): CHOL,  HDL, LDLCALC, TRIG, CHOLHDL, LDLDIRECT in the last 72 hours. Thyroid Function Tests: No results for input(s): TSH, T4TOTAL, FREET4, T3FREE, THYROIDAB in the last 72 hours. Anemia Panel: No results for input(s): VITAMINB12, FOLATE, FERRITIN, TIBC, IRON, RETICCTPCT in the last 72 hours. Sepsis Labs: Recent Labs  Lab 01/13/21 0708  PROCALCITON <0.10    Recent Results (from the past 240 hour(s))  Resp Panel by RT-PCR (Flu A&B, Covid) Nasopharyngeal Swab     Status: None   Collection Time: 01/13/21  3:50 PM   Specimen: Nasopharyngeal Swab; Nasopharyngeal(NP) swabs in vial transport medium  Result Value Ref Range Status   SARS Coronavirus 2 by RT PCR NEGATIVE NEGATIVE Final    Comment: (NOTE) SARS-CoV-2 target nucleic acids  are NOT DETECTED.  The SARS-CoV-2 RNA is generally detectable in upper respiratory specimens during the acute phase of infection. The lowest concentration of SARS-CoV-2 viral copies this assay can detect is 138 copies/mL. A negative result does not preclude SARS-Cov-2 infection and should not be used as the sole basis for treatment or other patient management decisions. A negative result may occur with  improper specimen collection/handling, submission of specimen other than nasopharyngeal swab, presence of viral mutation(s) within the areas targeted by this assay, and inadequate number of viral copies(<138 copies/mL). A negative result must be combined with clinical observations, patient history, and epidemiological information. The expected result is Negative.  Fact Sheet for Patients:  EntrepreneurPulse.com.au  Fact Sheet for Healthcare Providers:  IncredibleEmployment.be  This test is no t yet approved or cleared by the [place] FDA and  has been authorized for detection and/or diagnosis of SARS-CoV-2 by FDA under an Emergency Use Authorization (EUA). This EUA will remain  in effect (meaning this test can be used)  for the duration of the COVID-19 declaration under Section 564(b)(1) of the Act, 21 U.S.C.section 360bbb-3(b)(1), unless the authorization is terminated  or revoked sooner.       Influenza A by PCR NEGATIVE NEGATIVE Final   Influenza B by PCR NEGATIVE NEGATIVE Final    Comment: (NOTE) The Xpert Xpress SARS-CoV-2/FLU/RSV plus assay is intended as an aid in the diagnosis of influenza from Nasopharyngeal swab specimens and should not be used as a sole basis for treatment. Nasal washings and aspirates are unacceptable for Xpert Xpress SARS-CoV-2/FLU/RSV testing.  Fact Sheet for Patients: EntrepreneurPulse.com.au  Fact Sheet for Healthcare Providers: IncredibleEmployment.be  This test is not yet approved or cleared by the [place] FDA and has been authorized for detection and/or diagnosis of SARS-CoV-2 by FDA under an Emergency Use Authorization (EUA). This EUA will remain in effect (meaning this test can be used) for the duration of the COVID-19 declaration under Section 564(b)(1) of the Act, 21 U.S.C. section 360bbb-3(b)(1), unless the authorization is terminated or revoked.  Performed at [facility], [address] [place], [place] [address]          Radiology Studies: DG Chest 2 View  Result Date: 01/12/2021 CLINICAL DATA:  Dyspnea EXAM: CHEST - 2 VIEW COMPARISON:  05/04/2020 FINDINGS: Left-sided pacing device with leads over the right atrium and right ventricle. Borderline cardiomegaly with aortic atherosclerosis. Coarse chronic interstitial disease. Patchy bilateral airspace opacities suspicious for superimposed pneumonia. No pneumothorax. Small pleural effusions on lateral view. IMPRESSION: 1. Patchy bilateral opacities suspicious for acute airspace disease/probable pneumonia superimposed on underlying chronic interstitial changes. 2. Borderline cardiac size.  Small pleural effusions. Electronically Signed   By: Donavan Foil M.D.   On: 01/12/2021 20:44        Scheduled Meds:  alfuzosin  10 mg Oral Q breakfast   atorvastatin  10 mg Oral Daily   calcium carbonate  500 mg of elemental calcium Oral Q breakfast   cholecalciferol  2,000 Units Oral q AM   diltiazem  30 mg Oral Q6H   enoxaparin (LOVENOX) injection  40 mg Subcutaneous Q24H   feeding supplement  237 mL Oral TID BM   ferrous sulfate  325 mg Oral Q breakfast   finasteride  5 mg Oral Daily   furosemide  60 mg Intravenous BID   ipratropium-albuterol  3 mL Nebulization TID   multivitamin with minerals  1 tablet Oral q AM   QUEtiapine  25 mg Oral QHS  tamsulosin  0.4 mg Oral Daily   Continuous Infusions:   LOS: 11 days    Enzo Bi, MD [facility]   To contact the attending provider between 7A-7P or the covering provider during after hours 7P-7A, please log into the web site [email or web address] and access using universal [place] password for that web site. If you do not have the password, please call the hospital operator.  01/14/2021, 5:24 PM

## 2021-01-14 NOTE — Progress Notes (Signed)
Speech Language Pathology Treatment: Dysphagia  Patient Details Name: Christopher [name] MRN: [id number] DOB: 09/28/1927 Today's Date: 01/14/2021 Time: 1400-1430 SLP Time Calculation (min) (ACUTE ONLY): 30 min  Assessment / Plan / Recommendation Clinical Impression  Pt seen for ongoing toleration of diet and education re: general swallowing/breathing, diet consistency rec'd, and general aspiration precautions including recommendation for safer swallowing of Pills by use of a PUREE -- for all Pill swallowing. Pt has a Baseline of declined Respiratory status currently in light of illness and reports difficulty swallowing "Large" pills at Baseline. .  Discussed w/ pt his current diet consistency including use of Cut meats, moistened cooked foods for ease of mastication, conservation of energy, and overall intake. Pt denied any overt s/s of aspiration w/ oral intake at recent meals. Pt consumed trials of thin liquids via bottle/cup -- no straws. No overt clinical s/s of aspiration noted during/post intake. Talked w/ pt about reducing risks for aspiration by following general aspiration precautions to include Small sips Slowly, No straws, and Sit fully Upright w/ all oral intake. Recommended reducing distractions including Talking at meals and use of Rest Breaks to maintain calm breathing during oral intake - discussed the role of breathing when swallowing and challenges to the Timing of both during oral intake. Also discussed food consistencies and options, preparation, and use of condiments to soften foods also. Encouragement given for ALL Pills to be swallowed using a Puree for safer clearing of the oropharynx.  The above was discussed w/ pt/family member yesterday at evaluation, NSG. Handouts given to pt/family on aspiration precautions, general precautions, and Pill swallowing option discussed.     Recommend continue a Regular/mech soft diet (Cut/chopped meats w/ gravies to moisten meats/foods) w/ Thin  liquids via Cup; aspiration precautions; Reflux precautions; Pills in a Puree for safer swallowing. ST services will sign off at this time as pt appears at his Baseline w/ oropharyngeal phase swallowing; Education completed w/ pt, family (and w/ Dtr at evaluation visit). NSG to reconsult if new needs arise while admitted. Pt and NSG agreed.      HPI HPI: Pt is a 87 y.o. male with medical history significant for urothelial cancer of the bladder, history of atrial fibrillation on chronic anticoagulation therapy, hypertension, dyslipidemia who presents to the ER via EMS for evaluation of 2 witnessed syncopal episodes.  Patient had a cystoscopy with fulguration and cryosurgery on 12/20/20 for his known urothelial cancer of the bladder.  Per his daughter he was discharged home with a Foley catheter that was removed 2 days later without any issues besides frequent urination.  Patient then developed hematuria and had passed multiple clots at home.  He had 2 syncopal episodes, one while walking to the bathroom and another witnessed by EMS as they tried to get into the stretcher.  He complains of feeling dizzy and lightheaded and complains of severe pain around the penile head.  Per labs, pt had a significant drop in his H&H from 13.1g/dl a week ago to 8.9g/dl.  During his Lengthy hospitalization, pt is now being followed by Cardiology; Lasix being give for increased fluid retention.  CXR on 01/12/2021: "Patchy bilateral opacities suspicious for acute airspace  disease/probable pneumonia superimposed on underlying chronic  interstitial changes.  2. Borderline cardiac size.  Small pleural effusions.".  Per MD notes: Acute on chronic diastolic congestive heart failure; Paroxysmal atrial fibrillation with rapid ventricle response; Delirium, cont Seroquel at night.  Head CT: No acute intracranial abnormality seen.  SLP Plan  All goals met       Recommendations  Diet recommendations: Regular;Thin liquid (more  mech soft meats, moistened foods) Liquids provided via: Cup;No straw Medication Administration: Whole meds with puree (for safer swallowing) Supervision: Patient able to self feed Compensations: Minimize environmental distractions;Slow rate;Small sips/bites;Follow solids with liquid Postural Changes and/or Swallow Maneuvers: Out of bed for meals;Seated upright 90 degrees;Upright 30-60 min after meal (general Reflux precautions)                General recommendations:  (Dietician f/u for drink supplement) Oral Care Recommendations: Oral care BID;Patient independent with oral care Follow up Recommendations: None SLP Visit Diagnosis: Dysphagia, unspecified (R13.10) Plan: All goals met       GO                 Orinda Kenner, MS, CCC-SLP Speech Language Pathologist Rehab Services [phone]  [facility] 01/14/2021, 2:31 PM

## 2021-01-15 LAB — BASIC METABOLIC PANEL
Anion gap: 8 (ref 5–15)
BUN: 28 mg/dL — ABNORMAL HIGH (ref 8–23)
CO2: 35 mmol/L — ABNORMAL HIGH (ref 22–32)
Calcium: 8.1 mg/dL — ABNORMAL LOW (ref 8.9–10.3)
Chloride: 94 mmol/L — ABNORMAL LOW (ref 98–111)
Creatinine, Ser: 1.11 mg/dL (ref 0.61–1.24)
GFR, Estimated: >60 mL/min (ref >60)
Glucose, Bld: 122 mg/dL — ABNORMAL HIGH (ref 70–99)
Potassium: 3.3 mmol/L — ABNORMAL LOW (ref 3.5–5.1)
Sodium: 137 mmol/L (ref 135–145)

## 2021-01-15 LAB — CBC
HCT: 32.4 % — ABNORMAL LOW (ref 39.0–52.0)
Hemoglobin: 10.5 g/dL — ABNORMAL LOW (ref 13.0–17.0)
MCH: 30.9 pg (ref 26.0–34.0)
MCHC: 32.4 g/dL (ref 30.0–36.0)
MCV: 95.3 fL (ref 80.0–100.0)
Platelets: 305 10*3/uL (ref 150–400)
RBC: 3.4 MIL/uL — ABNORMAL LOW (ref 4.22–5.81)
RDW: 14.6 % (ref 11.5–15.5)
WBC: 8.7 10*3/uL (ref 4.0–10.5)
nRBC: 0 % (ref 0.0–0.2)

## 2021-01-15 LAB — MAGNESIUM: Magnesium: 2.3 mg/dL (ref 1.7–2.4)

## 2021-01-15 MED ORDER — IPRATROPIUM-ALBUTEROL 0.5-2.5 (3) MG/3ML IN SOLN
3.0000 mL | Freq: Two times a day (BID) | RESPIRATORY_TRACT | Status: DC
  Administered 2021-01-15 – 2021-01-16 (×2): 3 mL via RESPIRATORY_TRACT
  Filled 2021-01-15 (×2): qty 3

## 2021-01-15 MED ORDER — POTASSIUM CHLORIDE CRYS ER 20 MEQ PO TBCR
40.0000 meq | EXTENDED_RELEASE_TABLET | Freq: Once | ORAL | Status: AC
  Administered 2021-01-15: 40 meq via ORAL
  Filled 2021-01-15: qty 2

## 2021-01-15 MED ORDER — METOLAZONE 5 MG PO TABS
5.0000 mg | ORAL_TABLET | Freq: Once | ORAL | Status: AC
  Administered 2021-01-15: 5 mg via ORAL
  Filled 2021-01-15: qty 1

## 2021-01-15 MED ORDER — FUROSEMIDE 10 MG/ML IJ SOLN
60.0000 mg | Freq: Two times a day (BID) | INTRAMUSCULAR | Status: AC
  Administered 2021-01-15 (×2): 60 mg via INTRAVENOUS
  Filled 2021-01-15 (×2): qty 6

## 2021-01-15 MED ORDER — DILTIAZEM HCL 30 MG PO TABS
30.0000 mg | ORAL_TABLET | Freq: Four times a day (QID) | ORAL | Status: DC
  Administered 2021-01-15 – 2021-01-16 (×5): 30 mg via ORAL
  Filled 2021-01-15 (×5): qty 1

## 2021-01-15 MED ORDER — SODIUM CHLORIDE 0.9 % IV BOLUS
500.0000 mL | Freq: Once | INTRAVENOUS | Status: AC
  Administered 2021-01-15: 500 mL via INTRAVENOUS

## 2021-01-15 MED ORDER — SODIUM CHLORIDE 0.9 % IV BOLUS
250.0000 mL | Freq: Once | INTRAVENOUS | Status: AC
  Administered 2021-01-15: 250 mL via INTRAVENOUS

## 2021-01-15 MED ORDER — DILTIAZEM HCL-DEXTROSE 125-5 MG/125ML-% IV SOLN (PREMIX)
5.0000 mg/h | INTRAVENOUS | Status: DC
Start: 2021-01-15 — End: 2021-01-15
  Administered 2021-01-15: 5 mg/h via INTRAVENOUS
  Filled 2021-01-15: qty 125

## 2021-01-15 NOTE — Progress Notes (Signed)
Patient heart rate currently in 80-90s. Resting comfortably. Stopped Cardizem drip. Dr. [name] aware and in agreement.

## 2021-01-15 NOTE — Progress Notes (Signed)
PROGRESS NOTE    Christopher [name]  AST:[id number] DOB: May 22, 1928 DOA: 01/03/2021 PCP: [name], MD   Chief complaint.  Gross hematuria. Brief Narrative:  Christopher [name] is a 87 y.o. male with medical history significant for urothelial cancer of the bladder, history of atrial fibrillation on chronic anticoagulation therapy, hypertension, dyslipidemia who presents to the ER via EMS for evaluation of 2 witnessed syncopal episodes. Patient recently had cystoscopy with fulguration and cryosurgery for bladder cancer on 6/13.  He had a significant gross hematuria with a large amount of blood clots.  Evaluated by urology, started CBI. Cystoscopy and clot evacuation performed on 6/29. Developed rapid ventricular response with atrial fibrillation on 6/30.  Started on diltiazem drip, then transitioned to oral diltiazem.     Assessment & Plan:   Principal Problem:   Acute blood loss anemia Active Problems:   HTN (hypertension)   Benign prostatic hyperplasia with urinary frequency   Persistent atrial fibrillation (HCC)   Urothelial carcinoma of bladder (HCC)   Gross hematuria   Acute renal failure (ARF) (HCC)   Acute hypoxemic respiratory failure ([place])   Delirium  #1.  Delirium, resolved --cont seroquel 25 mg nightly  2.  Acute blood loss anemia. Hypovolemic shock. Iron deficient anemia. Gross hematuria secondary to bladder malignancy. status post cystoscopy and clot evacuation.   He received IV iron, 3u blood transfusion.   --Hgb stable in 9-10's Plan: --cont to hold Eliquis --cont iron suppl  Acute hypoxic respiratory failure Worsening dyspnea --on 3L O2 --CXR showed Patchy bilateral opacities.  BNP mildly elevated.  Procal neg, no fever or leukocytosis to suggest PNA.  Repeat covid neg. --started on more aggressive diuresis. Plan: --repeat IV lasix 40 mg BID plus metolazone today --Strict I/O --DuoNeb TID --Incentive spirometry  --Continue supplemental O2 to  keep sats >=92%, wean as tolerated  Acute on chronic diastolic congestive heart failure. --more aggressive diuresis as above  Paroxysmal atrial fibrillation with rapid ventricle response. --currently rate controlled --home Toprol was not started on admission.  Dilt ordered by cardio. Plan: --cont dilt 30 mg q6h --cont to hold Eliquis --cont to hold home Toprol since BP soft   Acute kidney injury secondary to obstruction. Hyponatremia  Hyperkalemia. Condition all resolved after cystoscopy.  General weakness. --PT/OT --discharge to SNF  BPH --cont Proscar and Flomax  Hypoalbumin --albumin 2.2 --Ensure --pt encourage to consume more protein   DVT prophylaxis: Lovenox Code Status: full Family Communication: daughter updated at bedside today Disposition Plan:    Status is: Inpatient  Remains inpatient appropriate because:Inpatient level of care appropriate due to severity of illness  Dispo: The patient is from: Home              Anticipated d/c is to: SNF              Patient currently is medically stable to d/c.  Waiting on insurance auth.    Difficult to place patient No   I/O last 3 completed shifts: In: 1051.2 [P.O.:300; I.V.:1.2; IV Piggyback:750] Out: 1875 [LGXQJ:1941] Total I/O In: 480 [P.O.:480] Out: 1200 [Urine:1200]     Consultants:  Cardiology  Procedures: None  Antimicrobials: None   Subjective: Pt had elevated HR overnight, and was placed on dilt gtt.  Rate controlled this morning, pt transitioned back to oral dilt.  Pt reported doing better.  Eating more.   Objective: Vitals:   01/15/21 0734 01/15/21 0759 01/15/21 1144 01/15/21 1556  BP: 111/65  (!) 112/58 (!) 129/40  Pulse: 77  (!) 59 (!) 58  Resp: 18  18 18   Temp: (!) 97.5 F (36.4 C)  97.7 F (36.5 C) 98.1 F (36.7 C)  TempSrc:    Oral  SpO2: 100% 100% 98% 98%  Weight:      Height:        Intake/Output Summary (Last 24 hours) at 01/15/2021 1726 Last data filed at  01/15/2021 1700 Gross per 24 hour  Intake 1381.18 ml  Output 2275 ml  Net -893.82 ml   Filed Weights   01/03/21 2312 01/06/21 0429 01/14/21 0937  Weight: 90 kg 89.5 kg 81.2 kg    Examination:  Constitutional: NAD, AAOx3 HEENT: conjunctivae and lids normal, EOMI CV: No cyanosis.   RESP: normal respiratory effort, on RA Extremities: No effusions, edema in BLE SKIN: warm, dry Neuro: II - XII grossly intact.   Psych: Normal mood and affect.  Appropriate judgement and reason   Data Reviewed: I have personally reviewed following labs and imaging studies  CBC: Recent Labs  Lab 01/09/21 0641 01/10/21 0602 01/11/21 0718 01/12/21 0545 01/13/21 0708 01/14/21 0522 01/15/21 0559  WBC 10.3 13.7* 11.6* 10.3 9.4 10.0 8.7  NEUTROABS 7.6 10.6* 8.7* 7.4  --   --   --   HGB 7.7* 9.6* 9.9* 9.9* 10.4* 10.1* 10.5*  HCT 23.2* 29.0* 31.0* 30.6* 32.3* 31.4* 32.4*  MCV 95.5 94.8 97.2 97.1 95.3 96.9 95.3  PLT 201 222 242 250 278 294 734   Basic Metabolic Panel: Recent Labs  Lab 01/10/21 0602 01/11/21 0718 01/12/21 0545 01/13/21 0708 01/14/21 0522 01/15/21 0559  NA 139 138 139 139 138 137  K 3.8 3.7 3.6 3.7 3.5 3.3*  CL 103 102 100 100 99 94*  CO2 29 31 29  32 32 35*  GLUCOSE 110* 101* 100* 111* 109* 122*  BUN 24* 22 24* 23 24* 28*  CREATININE 1.07 1.07 1.05 1.04 1.06 1.11  CALCIUM 8.0* 8.1* 7.9* 8.2* 8.3* 8.1*  MG 2.3 2.3 2.4 2.2 2.1 2.3  PHOS 2.9 2.8  --   --   --   --    GFR: Estimated Creatinine Clearance: 47.1 mL/min (by C-G formula based on SCr of 1.11 mg/dL). Liver Function Tests: Recent Labs  Lab 01/14/21 0522  ALBUMIN 2.2*   No results for input(s): LIPASE, AMYLASE in the last 168 hours. No results for input(s): AMMONIA in the last 168 hours. Coagulation Profile: No results for input(s): INR, PROTIME in the last 168 hours. Cardiac Enzymes: No results for input(s): CKTOTAL, CKMB, CKMBINDEX, TROPONINI in the last 168 hours. BNP (last 3 results) No results for  input(s): PROBNP in the last 8760 hours. HbA1C: No results for input(s): HGBA1C in the last 72 hours. CBG: No results for input(s): GLUCAP in the last 168 hours. Lipid Profile: No results for input(s): CHOL, HDL, LDLCALC, TRIG, CHOLHDL, LDLDIRECT in the last 72 hours. Thyroid Function Tests: No results for input(s): TSH, T4TOTAL, FREET4, T3FREE, THYROIDAB in the last 72 hours. Anemia Panel: No results for input(s): VITAMINB12, FOLATE, FERRITIN, TIBC, IRON, RETICCTPCT in the last 72 hours. Sepsis Labs: Recent Labs  Lab 01/13/21 0708  PROCALCITON <0.10    Recent Results (from the past 240 hour(s))  Resp Panel by RT-PCR (Flu A&B, Covid) Nasopharyngeal Swab     Status: None   Collection Time: 01/13/21  3:50 PM   Specimen: Nasopharyngeal Swab; Nasopharyngeal(NP) swabs in vial transport medium  Result Value Ref Range Status   SARS Coronavirus 2 by RT PCR NEGATIVE  NEGATIVE Final    Comment: (NOTE) SARS-CoV-2 target nucleic acids are NOT DETECTED.  The SARS-CoV-2 RNA is generally detectable in upper respiratory specimens during the acute phase of infection. The lowest concentration of SARS-CoV-2 viral copies this assay can detect is 138 copies/mL. A negative result does not preclude SARS-Cov-2 infection and should not be used as the sole basis for treatment or other patient management decisions. A negative result may occur with  improper specimen collection/handling, submission of specimen other than nasopharyngeal swab, presence of viral mutation(s) within the areas targeted by this assay, and inadequate number of viral copies(<138 copies/mL). A negative result must be combined with clinical observations, patient history, and epidemiological information. The expected result is Negative.  Fact Sheet for Patients:  EntrepreneurPulse.com.au  Fact Sheet for Healthcare Providers:  IncredibleEmployment.be  This test is no t yet approved or cleared  by the [place] FDA and  has been authorized for detection and/or diagnosis of SARS-CoV-2 by FDA under an Emergency Use Authorization (EUA). This EUA will remain  in effect (meaning this test can be used) for the duration of the COVID-19 declaration under Section 564(b)(1) of the Act, 21 U.S.C.section 360bbb-3(b)(1), unless the authorization is terminated  or revoked sooner.       Influenza A by PCR NEGATIVE NEGATIVE Final   Influenza B by PCR NEGATIVE NEGATIVE Final    Comment: (NOTE) The Xpert Xpress SARS-CoV-2/FLU/RSV plus assay is intended as an aid in the diagnosis of influenza from Nasopharyngeal swab specimens and should not be used as a sole basis for treatment. Nasal washings and aspirates are unacceptable for Xpert Xpress SARS-CoV-2/FLU/RSV testing.  Fact Sheet for Patients: EntrepreneurPulse.com.au  Fact Sheet for Healthcare Providers: IncredibleEmployment.be  This test is not yet approved or cleared by the [place] FDA and has been authorized for detection and/or diagnosis of SARS-CoV-2 by FDA under an Emergency Use Authorization (EUA). This EUA will remain in effect (meaning this test can be used) for the duration of the COVID-19 declaration under Section 564(b)(1) of the Act, 21 U.S.C. section 360bbb-3(b)(1), unless the authorization is terminated or revoked.  Performed at [facility], [address], [place],  [id number]          Radiology Studies: No results found.      Scheduled Meds:  alfuzosin  10 mg Oral Q breakfast   atorvastatin  10 mg Oral Daily   calcium carbonate  500 mg of elemental calcium Oral Q breakfast   cholecalciferol  2,000 Units Oral q AM   diltiazem  30 mg Oral Q6H   enoxaparin (LOVENOX) injection  40 mg Subcutaneous Q24H   feeding supplement  237 mL Oral TID BM   ferrous sulfate  325 mg Oral Q breakfast   finasteride  5 mg Oral Daily   ipratropium-albuterol  3  mL Nebulization BID   multivitamin with minerals  1 tablet Oral q AM   QUEtiapine  25 mg Oral QHS   tamsulosin  0.4 mg Oral Daily   Continuous Infusions:   LOS: 12 days    Enzo Bi, MD [facility]   To contact the attending provider between 7A-7P or the covering provider during after hours 7P-7A, please log into the web site [email or web address] and access using universal [place] password for that web site. If you do not have the password, please call the hospital operator.  01/15/2021, 5:26 PM

## 2021-01-15 NOTE — Progress Notes (Signed)
Patient resting in chair with family at bedside.  Heart rate running currently in the 130s. Patient without complaints.  Dr. [name] made aware. New orders received for Cardizem PO and given to patient at this time.

## 2021-01-15 NOTE — Progress Notes (Signed)
RNCM reached out to [employer] in attempt to find out if insurance authorization had been obtained. Unable to reach anyone by phone.

## 2021-01-16 ENCOUNTER — Inpatient Hospital Stay: Payer: Medicare HMO

## 2021-01-16 LAB — CBC
HCT: 34.5 % — ABNORMAL LOW (ref 39.0–52.0)
Hemoglobin: 11.2 g/dL — ABNORMAL LOW (ref 13.0–17.0)
MCH: 30.4 pg (ref 26.0–34.0)
MCHC: 32.5 g/dL (ref 30.0–36.0)
MCV: 93.8 fL (ref 80.0–100.0)
Platelets: 310 10*3/uL (ref 150–400)
RBC: 3.68 MIL/uL — ABNORMAL LOW (ref 4.22–5.81)
RDW: 14.5 % (ref 11.5–15.5)
WBC: 8.6 10*3/uL (ref 4.0–10.5)
nRBC: 0 % (ref 0.0–0.2)

## 2021-01-16 LAB — BASIC METABOLIC PANEL
Anion gap: 6 (ref 5–15)
BUN: 33 mg/dL — ABNORMAL HIGH (ref 8–23)
CO2: 36 mmol/L — ABNORMAL HIGH (ref 22–32)
Calcium: 8.6 mg/dL — ABNORMAL LOW (ref 8.9–10.3)
Chloride: 96 mmol/L — ABNORMAL LOW (ref 98–111)
Creatinine, Ser: 1.25 mg/dL — ABNORMAL HIGH (ref 0.61–1.24)
GFR, Estimated: 55 mL/min — ABNORMAL LOW (ref >60)
Glucose, Bld: 118 mg/dL — ABNORMAL HIGH (ref 70–99)
Potassium: 3.7 mmol/L (ref 3.5–5.1)
Sodium: 138 mmol/L (ref 135–145)

## 2021-01-16 LAB — MAGNESIUM: Magnesium: 2.3 mg/dL (ref 1.7–2.4)

## 2021-01-16 IMAGING — DX DG CHEST 1V PORT
1 series · 1 of 1 positions shown · non-contrast
Comparison: [DATE]

CLINICAL DATA: Dyspnea.

EXAM:
PORTABLE CHEST 1 VIEW

[chest ap]
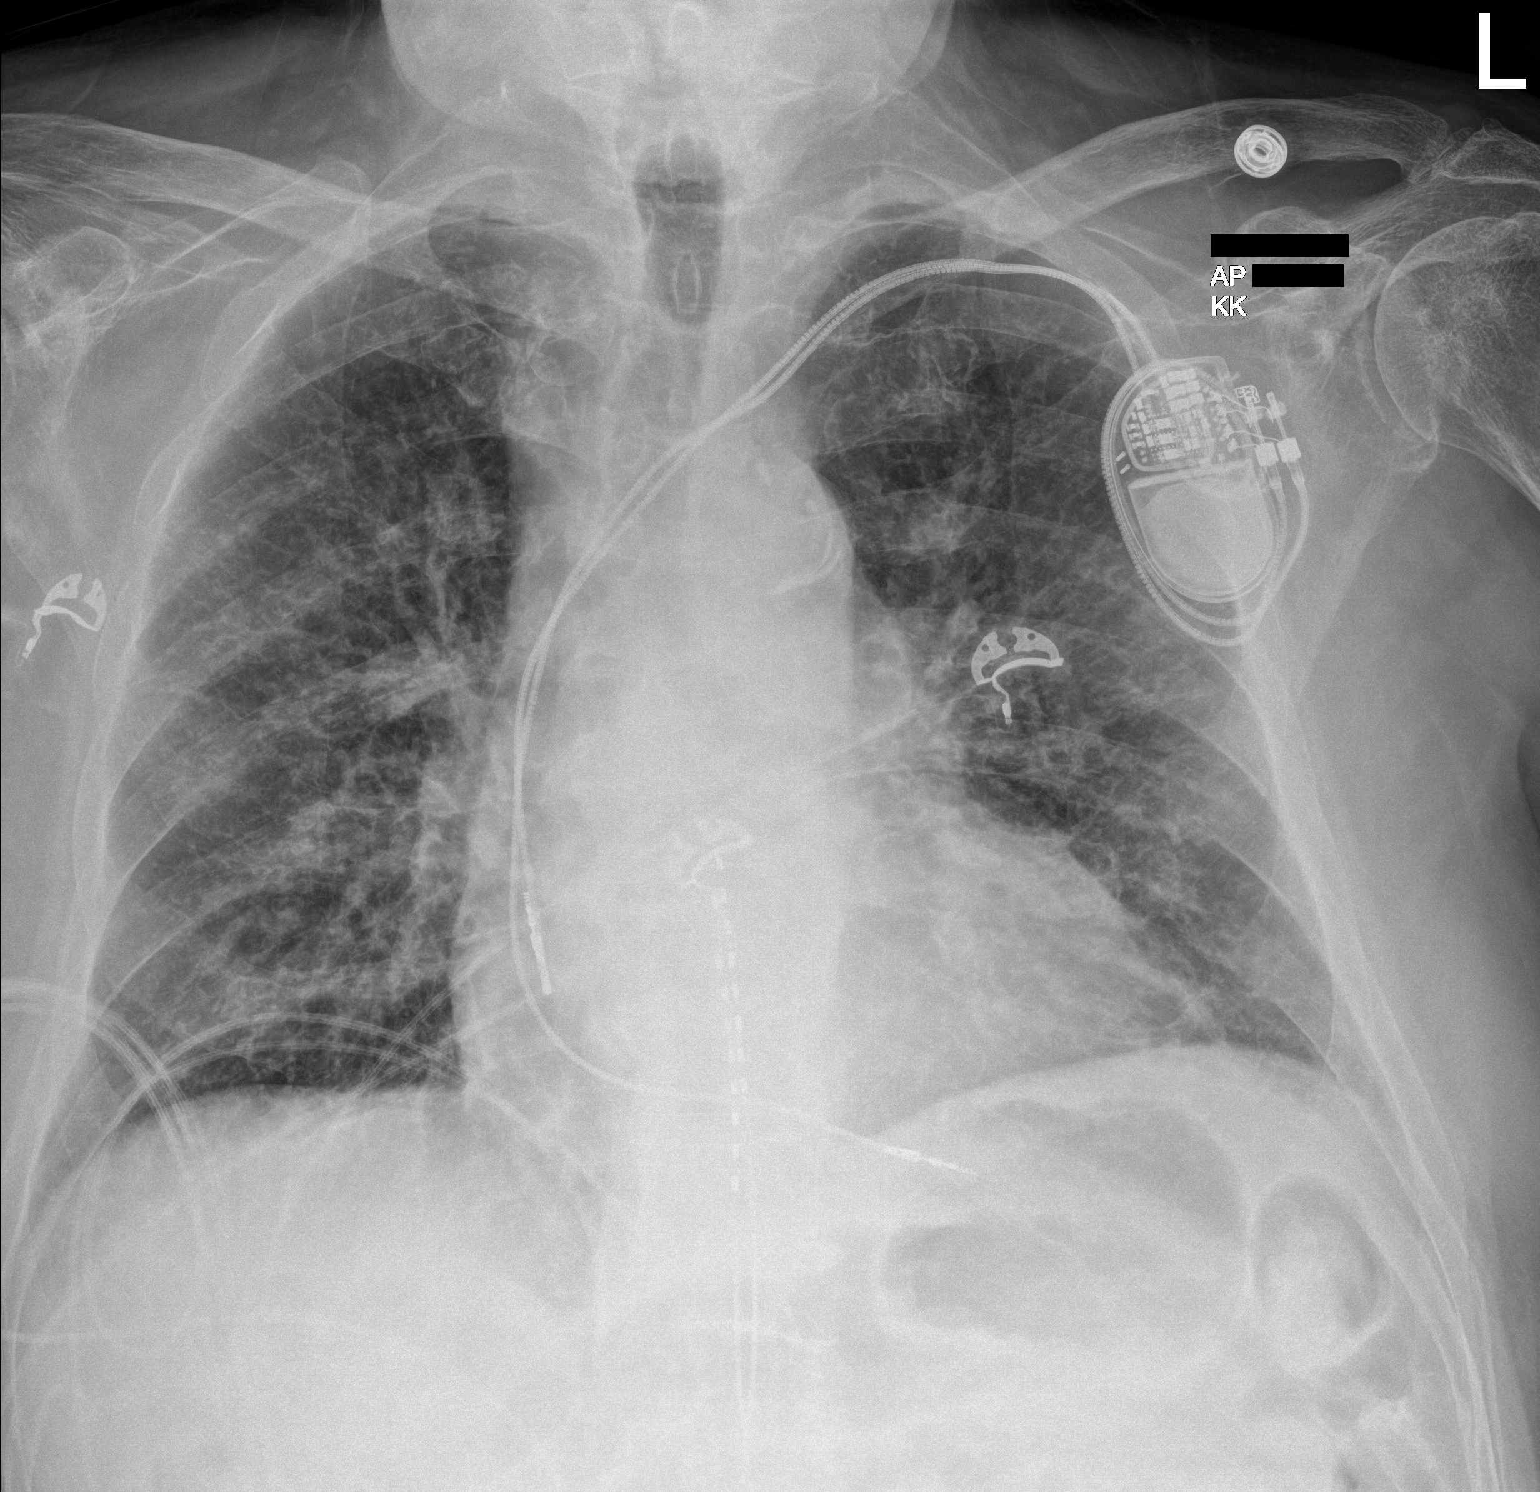

[1 of 1 positions shown; findings below may reference images not displayed]

FINDINGS: Decreased inspiration with no significant change in borderline
enlargement of the cardiac silhouette. Stable left subclavian
bipolar pacemaker leads and neural stimulator leads. Stable diffuse
prominence of the interstitial markings with normal vascularity. No
pleural fluid. Unremarkable bones.
IMPRESSION: 1. No acute abnormality.
2. Stable cardiomegaly and chronic interstitial lung disease.

## 2021-01-16 MED ORDER — METOPROLOL TARTRATE 50 MG PO TABS
50.0000 mg | ORAL_TABLET | Freq: Two times a day (BID) | ORAL | Status: DC
  Administered 2021-01-16 – 2021-01-20 (×9): 50 mg via ORAL
  Filled 2021-01-16 (×9): qty 1

## 2021-01-16 MED ORDER — IPRATROPIUM-ALBUTEROL 0.5-2.5 (3) MG/3ML IN SOLN: 3.0000 mL | Freq: Four times a day (QID) | RESPIRATORY_TRACT | Status: DC | PRN

## 2021-01-16 MED ORDER — DILTIAZEM HCL ER COATED BEADS 120 MG PO CP24
240.0000 mg | ORAL_CAPSULE | Freq: Every day | ORAL | Status: DC
  Administered 2021-01-16 – 2021-01-20 (×5): 240 mg via ORAL
  Filled 2021-01-16 (×5): qty 2

## 2021-01-16 NOTE — Progress Notes (Signed)
[facility] Encounter Note  Patient: Christopher [name] / Admit Date: 01/03/2021 / Date of Encounter: 01/16/2021, 12:25 PM   Subjective: Patient has had new onset atrial fibrillation with rapid ventricular rate at 130 bpm and more concerning causing some palpitations and needing further control.  No other cardiac symptoms at this time  Review of Systems: Positive for: Shortness of breath Negative for: Vision change, hearing change, syncope, dizziness, nausea, vomiting,diarrhea, bloody stool, stomach pain, cough, congestion, diaphoresis, urinary frequency, urinary pain,skin lesions, skin rashes Others previously listed  Objective: Telemetry: Atrial fibrillation with rapid ventricular rate Physical Exam: Blood pressure 117/83, pulse (!) 139, temperature 98.4 F (36.9 C), temperature source Oral, resp. rate 18, height 5\' 11"  (1.803 m), weight 81.2 kg, SpO2 93 %. Body mass index is 24.98 kg/m. General: Well developed, well nourished, in no acute distress. Head: Normocephalic, atraumatic, sclera non-icteric, no xanthomas, nares are without discharge. Neck: No apparent masses Lungs: Normal respirations with few wheezes, no rhonchi, no rales , no crackles   Heart: Irregularrate and rhythm, normal S1 S2, no murmur, no rub, no gallop, PMI is normal size and placement, carotid upstroke normal without bruit, jugular venous pressure normal Abdomen: Soft, non-tender, non-distended with normoactive bowel sounds. No hepatosplenomegaly. Abdominal aorta is normal size without bruit Extremities: No edema, no clubbing, no cyanosis, no ulcers,  Peripheral: 2+ radial, 2+ femoral, 2+ dorsal pedal pulses Neuro: [name] and oriented. Moves all extremities spontaneously. Psych:  Responds to questions appropriately with a normal affect.   Intake/Output Summary (Last 24 hours) at 01/16/2021 1225 Last data filed at 01/16/2021 1147 Gross per 24 hour  Intake 600 ml  Output 1600 ml  Net -1000 ml     Inpatient Medications:   alfuzosin  10 mg Oral Q breakfast   atorvastatin  10 mg Oral Daily   calcium carbonate  500 mg of elemental calcium Oral Q breakfast   cholecalciferol  2,000 Units Oral q AM   diltiazem  240 mg Oral Daily   enoxaparin (LOVENOX) injection  40 mg Subcutaneous Q24H   feeding supplement  237 mL Oral TID BM   ferrous sulfate  325 mg Oral Q breakfast   finasteride  5 mg Oral Daily   metoprolol tartrate  50 mg Oral BID   multivitamin with minerals  1 tablet Oral q AM   QUEtiapine  25 mg Oral QHS   tamsulosin  0.4 mg Oral Daily   Infusions:   Labs: Recent Labs    01/15/21 0559 01/16/21 0458  NA 137 138  K 3.3* 3.7  CL 94* 96*  CO2 35* 36*  GLUCOSE 122* 118*  BUN 28* 33*  CREATININE 1.11 1.25*  CALCIUM 8.1* 8.6*  MG 2.3 2.3   Recent Labs    01/14/21 0522  ALBUMIN 2.2*   Recent Labs    01/15/21 0559 01/16/21 0458  WBC 8.7 8.6  HGB 10.5* 11.2*  HCT 32.4* 34.5*  MCV 95.3 93.8  PLT 305 310   No results for input(s): CKTOTAL, CKMB, TROPONINI in the last 72 hours. Invalid input(s): POCBNP No results for input(s): HGBA1C in the last 72 hours.   Weights: Filed Weights   01/03/21 2312 01/06/21 0429 01/14/21 0937  Weight: 90 kg 89.5 kg 81.2 kg     Radiology/Studies:  DG Chest 2 View  Result Date: 01/12/2021 CLINICAL DATA:  Dyspnea EXAM: CHEST - 2 VIEW COMPARISON:  05/04/2020 FINDINGS: Left-sided pacing device with leads over the right atrium and right ventricle. Borderline  cardiomegaly with aortic atherosclerosis. Coarse chronic interstitial disease. Patchy bilateral airspace opacities suspicious for superimposed pneumonia. No pneumothorax. Small pleural effusions on lateral view. IMPRESSION: 1. Patchy bilateral opacities suspicious for acute airspace disease/probable pneumonia superimposed on underlying chronic interstitial changes. 2. Borderline cardiac size.  Small pleural effusions. Electronically Signed   By: Donavan Foil M.D.   On:  01/12/2021 20:44   CT HEAD WO CONTRAST  Result Date: 01/08/2021 CLINICAL DATA:  Delirium. EXAM: CT HEAD WITHOUT CONTRAST TECHNIQUE: Contiguous axial images were obtained from the base of the skull through the vertex without intravenous contrast. COMPARISON:  February 07, 2019. FINDINGS: Brain: Mild chronic ischemic white matter disease is noted. No mass effect or midline shift is noted. Ventricular size is within normal limits. There is no evidence of mass lesion, hemorrhage or acute infarction. Vascular: No hyperdense vessel or unexpected calcification. Skull: Normal. Negative for fracture or focal lesion. Sinuses/Orbits: No acute finding. Other: None. IMPRESSION: No acute intracranial abnormality seen. Electronically Signed   By: Marijo Conception M.D.   On: 01/08/2021 14:31   DG OR UROLOGY CYSTO IMAGE ([facility] ONLY)  Result Date: 01/05/2021 There is no interpretation for this exam.  This order is for images obtained during a surgical procedure.  Please See "Surgeries" Tab for more information regarding the procedure.   ECHOCARDIOGRAM COMPLETE  Result Date: 01/07/2021    ECHOCARDIOGRAM REPORT   Patient Name:   Christopher [name] Date of Exam: 01/07/2021 Medical Rec #:  [id number]         Height:       71.0 in Accession #:    [id number]        Weight:       197.3 lb Date of Birth:  08-23-1927         BSA:          2.096 m Patient Age:    42 years          BP:           111/83 mmHg Patient Gender: M                 HR:           65 bpm. Exam Location:  [facility] Procedure: 2D Echo, Cardiac Doppler and Color Doppler Indications:     CHF-acute diastolic U93.23  History:         Patient has prior history of Echocardiogram examinations, most                  recent 03/01/2015. Pacemaker, Arrythmias:Atrial Fibrillation;                  Risk Factors:Dyslipidemia.  Sonographer:     Sherrie Sport RDCS (AE) Referring Phys:  [id number] Sharen Hones Diagnosing Phys: Bartholome Bill MD  Sonographer Comments: Technically challenging study due to  limited acoustic windows, suboptimal apical window and suboptimal subcostal window. IMPRESSIONS  1. Left ventricular ejection fraction, by estimation, is 70 to 75%. The left ventricle has hyperdynamic function. The left ventricle has no regional wall motion abnormalities. There is mild left ventricular hypertrophy. Left ventricular diastolic parameters are consistent with Grade I diastolic dysfunction (impaired relaxation).  2. Right ventricular systolic function is normal. The right ventricular size is normal.  3. Right atrial size was mildly dilated.  4. The mitral valve is grossly normal. Trivial mitral valve regurgitation.  5. The aortic valve is grossly normal. Aortic valve regurgitation is trivial. Mild to moderate aortic valve sclerosis/calcification  is present, without any evidence of aortic stenosis. FINDINGS  Left Ventricle: Left ventricular ejection fraction, by estimation, is 70 to 75%. The left ventricle has hyperdynamic function. The left ventricle has no regional wall motion abnormalities. The left ventricular internal cavity size was normal in size. There is mild left ventricular hypertrophy. Left ventricular diastolic parameters are consistent with Grade I diastolic dysfunction (impaired relaxation). Right Ventricle: The right ventricular size is normal. No increase in right ventricular wall thickness. Right ventricular systolic function is normal. Left Atrium: Left atrial size was normal in size. Right Atrium: Right atrial size was mildly dilated. Pericardium: There is no evidence of pericardial effusion. Mitral Valve: The mitral valve is grossly normal. Trivial mitral valve regurgitation. Tricuspid Valve: The tricuspid valve is grossly normal. Tricuspid valve regurgitation is trivial. Aortic Valve: The aortic valve is grossly normal. Aortic valve regurgitation is trivial. Mild to moderate aortic valve sclerosis/calcification is present, without any evidence of aortic stenosis. Aortic valve mean  gradient measures 11.7 mmHg. Aortic valve peak gradient measures 21.6 mmHg. Aortic valve area, by VTI measures 1.57 cm. Pulmonic Valve: The pulmonic valve was not well visualized. Pulmonic valve regurgitation is not visualized. Aorta: The aortic root is normal in size and structure. IAS/Shunts: The atrial septum is grossly normal.  LEFT VENTRICLE PLAX 2D LVIDd:         3.71 cm  Diastology LVIDs:         2.03 cm  LV e' medial:    5.33 cm/s LV PW:         1.34 cm  LV E/e' medial:  21.6 LV IVS:        1.45 cm  LV e' lateral:   9.68 cm/s LVOT diam:     2.00 cm  LV E/e' lateral: 11.9 LV SV:         63 LV SV Index:   30 LVOT Area:     3.14 cm  RIGHT VENTRICLE RV S prime:     16.40 cm/s TAPSE (M-mode): 4.8 cm LEFT ATRIUM              Index       RIGHT ATRIUM           Index LA diam:        3.60 cm  1.72 cm/m  RA Area:     27.40 cm LA Vol (A2C):   74.0 ml  35.30 ml/m RA Volume:   85.00 ml  40.54 ml/m LA Vol (A4C):   119.0 ml 56.76 ml/m LA Biplane Vol: 103.0 ml 49.13 ml/m  AORTIC VALVE                    PULMONIC VALVE AV Area (Vmax):    1.66 cm     PV Vmax:        0.93 m/s AV Area (Vmean):   1.53 cm     PV Peak grad:   3.5 mmHg AV Area (VTI):     1.57 cm     RVOT Peak grad: 3 mmHg AV Vmax:           232.33 cm/s AV Vmean:          161.333 cm/s AV VTI:            0.397 m AV Peak Grad:      21.6 mmHg AV Mean Grad:      11.7 mmHg LVOT Vmax:         123.00 cm/s LVOT Vmean:  78.500 cm/s LVOT VTI:          0.199 m LVOT/AV VTI ratio: 0.50  AORTA Ao Root diam: 3.50 cm MITRAL VALVE                TRICUSPID VALVE MV Area (PHT): 3.54 cm     TR Peak grad:   18.8 mmHg MV Decel Time: 214 msec     TR Vmax:        217.00 cm/s MV E velocity: 115.00 cm/s MV A velocity: 68.60 cm/s   SHUNTS MV E/A ratio:  1.68         Systemic VTI:  0.20 m                             Systemic Diam: 2.00 cm Bartholome Bill MD Electronically signed by Bartholome Bill MD Signature Date/Time: 01/07/2021/3:19:25 PM    Final      Assessment and  Recommendation  87 y.o. male with multiple medical problems and acute paroxysmal nonvalvular atrial fibrillation with rapid ventricular rate now needing further medication management and heart rate control.  Patient previously was on metoprolol at home and will need adjustments 1.  Reinstatement of beta-blocker for heart rate control in addition to long-acting diltiazem for goal heart rate below 100 bpm 2.  No further cardiac diagnostics necessary at this time Signed, Serafina Royals M.D. FACC

## 2021-01-16 NOTE — Progress Notes (Signed)
PROGRESS NOTE    Christopher [name]  OJJ:[id number] DOB: 1928/05/25 DOA: 01/03/2021 PCP: [name], MD   Chief complaint.  Gross hematuria. Brief Narrative:  Christopher [name] is a 87 y.o. male with medical history significant for urothelial cancer of the bladder, history of atrial fibrillation on chronic anticoagulation therapy, hypertension, dyslipidemia who presents to the ER via EMS for evaluation of 2 witnessed syncopal episodes. Patient recently had cystoscopy with fulguration and cryosurgery for bladder cancer on 6/13.  He had a significant gross hematuria with a large amount of blood clots.  Evaluated by urology, started CBI. Cystoscopy and clot evacuation performed on 6/29. Developed rapid ventricular response with atrial fibrillation on 6/30.  Started on diltiazem drip, then transitioned to oral diltiazem.    Assessment & Plan:   Principal Problem:   Acute blood loss anemia Active Problems:   HTN (hypertension)   Benign prostatic hyperplasia with urinary frequency   Persistent atrial fibrillation (HCC)   Urothelial carcinoma of bladder (HCC)   Gross hematuria   Acute renal failure (ARF) (HCC)   Acute hypoxemic respiratory failure ([place])   Delirium  #1.  Delirium, resolved --cont seroquel 25 mg nightly  2.  Acute blood loss anemia. Hypovolemic shock. Iron deficient anemia. Gross hematuria secondary to bladder malignancy. status post cystoscopy and clot evacuation.   He received IV iron, 3u blood transfusion.   --Hgb stable in 9-10's Plan: --cont to hold Eliquis --cont iron suppl  Acute hypoxic respiratory failure Dyspnea, improved --on 3L O2, now weaned down to RA --CXR showed Patchy bilateral opacities.  BNP mildly elevated.  Procal neg, no fever or leukocytosis to suggest PNA.  Repeat covid neg. --started on more aggressive diuresis with IV lasix 40 mg BID plus metolazone  Plan: --hold diuresis today due to slight Cr bump --Strict I/O --Incentive  spirometry   Acute on chronic diastolic congestive heart failure. --hold diuresis today due to slight Cr bump --likely resume torsemide at discharge  Paroxysmal atrial fibrillation with rapid ventricle response. --home Toprol was not started on admission.  Dilt ordered by cardio. --rate increased to 130's overnight and persistent Plan: --switch to cardizem 240 mg daily, per cardio --resume Lopressor 50 mg BID, per cardio --cont to hold Eliquis  Acute kidney injury secondary to obstruction. Hyponatremia  Hyperkalemia. Condition all resolved after cystoscopy.  General weakness. --PT/OT --discharge to SNF  BPH --cont Proscar and Flomax  Hypoalbumin --albumin 2.2 --Ensure TID --pt encouraged to consume more protein   DVT prophylaxis: Lovenox Code Status: full Family Communication: daughter updated at bedside today Disposition Plan:    Status is: Inpatient  Remains inpatient appropriate because:Inpatient level of care appropriate due to severity of illness  Dispo: The patient is from: Home              Anticipated d/c is to: SNF              Patient currently is not medically stable to d/c.  Pt went back to Afib w RVR    Difficult to place patient No   I/O last 3 completed shifts: In: 1621.2 [P.O.:870; I.V.:1.2; IV Piggyback:750] Out: 2650 [Urine:2650] Total I/O In: 120 [P.O.:120] Out: 78 [Urine:450]     Consultants:  Cardiology  Procedures: None  Antimicrobials: None   Subjective: HR has been in 130's since overnight.  Pt was asymptomatic.  Cardiology contacted.  Still voiding well, still no more hematuria.   Objective: Vitals:   01/16/21 0907 01/16/21 1127 01/16/21 1310 01/16/21  1311  BP: 106/68 117/83 115/71   Pulse: (!) 129 (!) 139  (!) 136  Resp: 19 18    Temp: 97.9 F (36.6 C) 98.4 F (36.9 C)    TempSrc:  Oral    SpO2: 91% 93%    Weight:      Height:        Intake/Output Summary (Last 24 hours) at 01/16/2021 1426 Last data  filed at 01/16/2021 1313 Gross per 24 hour  Intake 600 ml  Output 1700 ml  Net -1100 ml   Filed Weights   01/03/21 2312 01/06/21 0429 01/14/21 0937  Weight: 90 kg 89.5 kg 81.2 kg    Examination:  Constitutional: NAD, AAOx3 HEENT: conjunctivae and lids normal, EOMI CV: No cyanosis.   RESP: normal respiratory effort, on RA Extremities: No effusions, edema in BLE SKIN: warm, dry Neuro: II - XII grossly intact.   Psych: Normal mood and affect.  Appropriate judgement and reason   Data Reviewed: I have personally reviewed following labs and imaging studies  CBC: Recent Labs  Lab 01/10/21 0602 01/11/21 0718 01/12/21 0545 01/13/21 0708 01/14/21 0522 01/15/21 0559 01/16/21 0458  WBC 13.7* 11.6* 10.3 9.4 10.0 8.7 8.6  NEUTROABS 10.6* 8.7* 7.4  --   --   --   --   HGB 9.6* 9.9* 9.9* 10.4* 10.1* 10.5* 11.2*  HCT 29.0* 31.0* 30.6* 32.3* 31.4* 32.4* 34.5*  MCV 94.8 97.2 97.1 95.3 96.9 95.3 93.8  PLT 222 242 250 278 294 305 916   Basic Metabolic Panel: Recent Labs  Lab 01/10/21 0602 01/11/21 0718 01/12/21 0545 01/13/21 0708 01/14/21 0522 01/15/21 0559 01/16/21 0458  NA 139 138 139 139 138 137 138  K 3.8 3.7 3.6 3.7 3.5 3.3* 3.7  CL 103 102 100 100 99 94* 96*  CO2 29 31 29  32 32 35* 36*  GLUCOSE 110* 101* 100* 111* 109* 122* 118*  BUN 24* 22 24* 23 24* 28* 33*  CREATININE 1.07 1.07 1.05 1.04 1.06 1.11 1.25*  CALCIUM 8.0* 8.1* 7.9* 8.2* 8.3* 8.1* 8.6*  MG 2.3 2.3 2.4 2.2 2.1 2.3 2.3  PHOS 2.9 2.8  --   --   --   --   --    GFR: Estimated Creatinine Clearance: 41.8 mL/min (A) (by C-G formula based on SCr of 1.25 mg/dL (H)). Liver Function Tests: Recent Labs  Lab 01/14/21 0522  ALBUMIN 2.2*   No results for input(s): LIPASE, AMYLASE in the last 168 hours. No results for input(s): AMMONIA in the last 168 hours. Coagulation Profile: No results for input(s): INR, PROTIME in the last 168 hours. Cardiac Enzymes: No results for input(s): CKTOTAL, CKMB, CKMBINDEX,  TROPONINI in the last 168 hours. BNP (last 3 results) No results for input(s): PROBNP in the last 8760 hours. HbA1C: No results for input(s): HGBA1C in the last 72 hours. CBG: No results for input(s): GLUCAP in the last 168 hours. Lipid Profile: No results for input(s): CHOL, HDL, LDLCALC, TRIG, CHOLHDL, LDLDIRECT in the last 72 hours. Thyroid Function Tests: No results for input(s): TSH, T4TOTAL, FREET4, T3FREE, THYROIDAB in the last 72 hours. Anemia Panel: No results for input(s): VITAMINB12, FOLATE, FERRITIN, TIBC, IRON, RETICCTPCT in the last 72 hours. Sepsis Labs: Recent Labs  Lab 01/13/21 0708  PROCALCITON <0.10    Recent Results (from the past 240 hour(s))  Resp Panel by RT-PCR (Flu A&B, Covid) Nasopharyngeal Swab     Status: None   Collection Time: 01/13/21  3:50 PM   Specimen:  Nasopharyngeal Swab; Nasopharyngeal(NP) swabs in vial transport medium  Result Value Ref Range Status   SARS Coronavirus 2 by RT PCR NEGATIVE NEGATIVE Final    Comment: (NOTE) SARS-CoV-2 target nucleic acids are NOT DETECTED.  The SARS-CoV-2 RNA is generally detectable in upper respiratory specimens during the acute phase of infection. The lowest concentration of SARS-CoV-2 viral copies this assay can detect is 138 copies/mL. A negative result does not preclude SARS-Cov-2 infection and should not be used as the sole basis for treatment or other patient management decisions. A negative result may occur with  improper specimen collection/handling, submission of specimen other than nasopharyngeal swab, presence of viral mutation(s) within the areas targeted by this assay, and inadequate number of viral copies(<138 copies/mL). A negative result must be combined with clinical observations, patient history, and epidemiological information. The expected result is Negative.  Fact Sheet for Patients:  EntrepreneurPulse.com.au  Fact Sheet for Healthcare Providers:   IncredibleEmployment.be  This test is no t yet approved or cleared by the [place] FDA and  has been authorized for detection and/or diagnosis of SARS-CoV-2 by FDA under an Emergency Use Authorization (EUA). This EUA will remain  in effect (meaning this test can be used) for the duration of the COVID-19 declaration under Section 564(b)(1) of the Act, 21 U.S.C.section 360bbb-3(b)(1), unless the authorization is terminated  or revoked sooner.       Influenza A by PCR NEGATIVE NEGATIVE Final   Influenza B by PCR NEGATIVE NEGATIVE Final    Comment: (NOTE) The Xpert Xpress SARS-CoV-2/FLU/RSV plus assay is intended as an aid in the diagnosis of influenza from Nasopharyngeal swab specimens and should not be used as a sole basis for treatment. Nasal washings and aspirates are unacceptable for Xpert Xpress SARS-CoV-2/FLU/RSV testing.  Fact Sheet for Patients: EntrepreneurPulse.com.au  Fact Sheet for Healthcare Providers: IncredibleEmployment.be  This test is not yet approved or cleared by the [place] FDA and has been authorized for detection and/or diagnosis of SARS-CoV-2 by FDA under an Emergency Use Authorization (EUA). This EUA will remain in effect (meaning this test can be used) for the duration of the COVID-19 declaration under Section 564(b)(1) of the Act, 21 U.S.C. section 360bbb-3(b)(1), unless the authorization is terminated or revoked.  Performed at [facility], [address], [place], [place] [address]          Radiology Studies: No results found.      Scheduled Meds:  alfuzosin  10 mg Oral Q breakfast   atorvastatin  10 mg Oral Daily   calcium carbonate  500 mg of elemental calcium Oral Q breakfast   cholecalciferol  2,000 Units Oral q AM   diltiazem  240 mg Oral Daily   enoxaparin (LOVENOX) injection  40 mg Subcutaneous Q24H   feeding supplement  237 mL Oral TID BM   ferrous  sulfate  325 mg Oral Q breakfast   finasteride  5 mg Oral Daily   metoprolol tartrate  50 mg Oral BID   multivitamin with minerals  1 tablet Oral q AM   QUEtiapine  25 mg Oral QHS   tamsulosin  0.4 mg Oral Daily   Continuous Infusions:   LOS: 13 days    Enzo Bi, MD [facility]   To contact the attending provider between 7A-7P or the covering provider during after hours 7P-7A, please log into the web site [email or web address] and access using universal [place] password for that web site. If you do not have the password, please call  the hospital operator.  01/16/2021, 2:26 PM

## 2021-01-16 NOTE — Progress Notes (Signed)
   01/16/21 0737  Assess: MEWS Score  Temp 97.9 F (36.6 C)  BP 111/77  Pulse Rate (!) 129  Resp 19  SpO2 93 %  O2 Device Room Air  Assess: MEWS Score  MEWS Temp 0  MEWS Systolic 0  MEWS Pulse 2  MEWS RR 0  MEWS LOC 0  MEWS Score 2  MEWS Score Color Yellow  Assess: if the MEWS score is Yellow or Red  Were vital signs taken at a resting state? Yes  Focused Assessment No change from prior assessment (MD and Cardio aware)  Does the patient meet 2 or more of the SIRS criteria? No  Does the patient have a confirmed or suspected source of infection? No  MEWS guidelines implemented *See Row Information* Yes  Treat  MEWS Interventions Administered scheduled meds/treatments  Pain Scale 0-10  Pain Score Asleep  Patients Stated Pain Goal 0  Take Vital Signs  Increase Vital Sign Frequency  Yellow: Q 2hr X 2 then Q 4hr X 2, if remains yellow, continue Q 4hrs  Escalate  MEWS: Escalate Yellow: discuss with charge nurse/RN and consider discussing with provider and RRT  Notify: Charge Nurse/RN  Name of Charge Nurse/RN Notified Britt Bolognese, RN  Date Charge Nurse/RN Notified 01/16/21  Time Charge Nurse/RN Notified 0753  Notify: Provider  Provider Name/Title Dr. [name]  Date Provider Notified 01/16/21  Time Provider Notified [phone]  Notification Type Page (secure chat)  Notification Reason Other (Comment) (increased HR (MD and cardio already aware))  Document  Patient Outcome Other (Comment) (MD said as long as pt is asymptomatic we will just monitor closely. Pt continues to be asymptomatic)  Progress note created (see row info) Yes  Assess: SIRS CRITERIA  SIRS Temperature  0  SIRS Pulse 1  SIRS Respirations  0  SIRS WBC 0  SIRS Score Sum  1

## 2021-01-17 LAB — BASIC METABOLIC PANEL
Anion gap: 5 (ref 5–15)
BUN: 39 mg/dL — ABNORMAL HIGH (ref 8–23)
CO2: 36 mmol/L — ABNORMAL HIGH (ref 22–32)
Calcium: 8.9 mg/dL (ref 8.9–10.3)
Chloride: 95 mmol/L — ABNORMAL LOW (ref 98–111)
Creatinine, Ser: 1.28 mg/dL — ABNORMAL HIGH (ref 0.61–1.24)
GFR, Estimated: 53 mL/min — ABNORMAL LOW (ref >60)
Glucose, Bld: 123 mg/dL — ABNORMAL HIGH (ref 70–99)
Potassium: 3.4 mmol/L — ABNORMAL LOW (ref 3.5–5.1)
Sodium: 136 mmol/L (ref 135–145)

## 2021-01-17 LAB — CBC
HCT: 32.8 % — ABNORMAL LOW (ref 39.0–52.0)
Hemoglobin: 10.6 g/dL — ABNORMAL LOW (ref 13.0–17.0)
MCH: 31.1 pg (ref 26.0–34.0)
MCHC: 32.3 g/dL (ref 30.0–36.0)
MCV: 96.2 fL (ref 80.0–100.0)
Platelets: 333 10*3/uL (ref 150–400)
RBC: 3.41 MIL/uL — ABNORMAL LOW (ref 4.22–5.81)
RDW: 14.5 % (ref 11.5–15.5)
WBC: 7.8 10*3/uL (ref 4.0–10.5)
nRBC: 0 % (ref 0.0–0.2)

## 2021-01-17 LAB — MAGNESIUM: Magnesium: 2.4 mg/dL (ref 1.7–2.4)

## 2021-01-17 NOTE — Progress Notes (Signed)
Occupational Therapy Treatment Patient Details Name: Christopher [name] MRN: [id number] DOB: [birth year] Today's Date: 01/17/2021    History of present illness Pt is a 87 yo male s/p cystoscopy with fulguration and cryotherapy 6/13/022, experienced two syncopal episodes and hematuria, after discharging home, returned to [facility]. 01/05/2021 underwent cystoscopy with clot evacuation and liberation.  He developed atrial fibrillation with rapid ventricular response and was placed on a diltiazem drip.  PMH of afib, bladder cancer, HLD, HTN, cardiac pacemaker, spinal cord stimulator   OT comments  Pt seen for OT treatment on this date. Upon arrival to room, pt awake and seated upright in chair. Pt reporting no pain and agreeable to tx. Pt engaged in seated UE/LE therapy exercises (see below) and encouraged to continue independently while seated in recliner; pt verbalized understanding. Pt currently presents with decreased strength, balance, and activity tolerance, and requires MIN GUARD to adjust socks via seated figure-4 position, MIN GUARD for stand pivot transfers to/from [facility], and MAX A for toilet hygiene. HR 60-75 and SpO2 >95% throughout session. Of note, pt dizzy with all standing activity; seated BP 99/51 (MAP 65), unable to obtain standing BP due to severity of sx, however seated BP immediately after standing 89/52 (MAP 63), seated BP after rest break 92/50 (MAP 64). Pt left in recliner in no acute distress with family present. Pt is making good progress toward goals and continues to benefit from skilled OT services to maximize return to PLOF and minimize risk of future falls, injury, caregiver burden, and readmission. Will continue to follow POC. Discharge recommendation remains appropriate.     Follow Up Recommendations  SNF;Supervision/Assistance - 24 hour    Equipment Recommendations  Other (comment) (defer to next venue of care)       Precautions / Restrictions Precautions Precautions:  Fall Precaution Comments: Monitor BP, HR w/ all activity Restrictions Weight Bearing Restrictions: No       Mobility Bed Mobility Overal bed mobility: Needs Assistance Bed Mobility: Supine to Sit     Supine to sit: Supervision     General bed mobility comments: not assessed as pt in recliner at beginning/end of session    Transfers Overall transfer level: Needs assistance Equipment used: Rolling walker (2 wheeled) Transfers: Sit to/from [facility] Sit to Stand: Min guard Stand pivot transfers: Min guard       General transfer comment: dizzy upon standing    Balance Overall balance assessment: Needs assistance Sitting-balance support: No upper extremity supported;Feet supported Sitting balance-Leahy Scale: Good Sitting balance - Comments: good sitting balance while sitting on BSC   Standing balance support: Bilateral upper extremity supported;During functional activity Standing balance-Leahy Scale: Fair Standing balance comment: MIN GUARD and b/l UE support on RW                           ADL either performed or assessed with clinical judgement   ADL Overall ADL's : Needs assistance/impaired                     Lower Body Dressing: Min guard Lower Body Dressing Details (indicate cue type and reason): to adjust socks via figure-4 position Toilet Transfer: Min guard;Stand-pivot;BSC   Toileting- Clothing Manipulation and Hygiene: Maximal assistance;Sit to/from stand Toileting - Clothing Manipulation Details (indicate cue type and reason): MAX A for posterior peri-care     Functional mobility during ADLs: Min guard;Rolling walker (to take small shuffled steps to/from [facility])  Cognition Arousal/Alertness: Awake/alert Behavior During Therapy: WFL for tasks assessed/performed Overall Cognitive Status: Within Functional Limits for tasks assessed                                 General Comments: pleasant,  motivated        Exercises General Exercises - Upper Extremity Shoulder Flexion: AROM;Strengthening;10 reps;Seated Shoulder Horizontal ABduction: AROM;Strengthening;10 reps;Seated General Exercises - Lower Extremity Ankle Circles/Pumps: AROM;Both;10 reps;Seated Long Arc Quad: AROM;Both;20 reps;Seated Heel Slides: AROM;Both;10 reps;Seated Hip Flexion/Marching: AROM;Seated;Both;10 reps Other Exercises Other Exercises: x10 reps of scapula retraction      General Comments Lightheadedness w/ all standing activity: Seated BP 99/51 (MAP 65), HR 67, SpO2 97%. Unable to obtain standing BP due to severity of sx, however BP immediately after standing 89/52 (MAP 63). BP seated after rest break 92/50 (MAP 64)    Pertinent Vitals/ Pain       Pain Assessment: No/denies pain         Frequency  Min 2X/week        Progress Toward Goals  OT Goals(current goals can now be found in the care plan section)  Progress towards OT goals: Progressing toward goals  Acute Rehab OT Goals Patient Stated Goal: to get stronger OT Goal Formulation: With patient Time For Goal Achievement: 01/25/21 Potential to Achieve Goals: Good  Plan Discharge plan remains appropriate;Frequency remains appropriate       AM-PAC OT "6 Clicks" Daily Activity     Outcome Measure   Help from another person eating meals?: None Help from another person taking care of personal grooming?: A Little Help from another person toileting, which includes using toliet, bedpan, or urinal?: A Lot Help from another person bathing (including washing, rinsing, drying)?: A Lot Help from another person to put on and taking off regular upper body clothing?: A Little Help from another person to put on and taking off regular lower body clothing?: A Lot 6 Click Score: 16    End of Session Equipment Utilized During Treatment: Gait belt;Rolling walker  OT Visit Diagnosis: Unsteadiness on feet (R26.81);Repeated falls (R29.6);Muscle  weakness (generalized) (M62.81)   Activity Tolerance Patient tolerated treatment well   Patient Left in chair;with call bell/phone within reach;with chair alarm set;with family/visitor present   Nurse Communication Other (comment) (BP)        Time: 6387-5643 OT Time Calculation (min): 44 min  Charges: OT General Charges $OT Visit: 1 Visit OT Treatments $Self Care/Home Management : 23-37 mins $Therapeutic Activity: 8-22 mins  Fredirick Maudlin, OTR/L [place]

## 2021-01-17 NOTE — Care Management Important Message (Signed)
Important Message  Patient Details  Name: SHAYN MADOLE MRN: [id number] Date of Birth: 1927-09-18   Medicare Important Message Given:  Yes     [name] 01/17/2021, 11:46 AM

## 2021-01-17 NOTE — TOC Progression Note (Signed)
Transition of Care [facility]) - Progression Note    Patient Details  Name: Christopher [name] MRN: [id number] Date of Birth: Nov 19, 1927  Transition of Care [facility]) CM/SW [place], [place] Phone Number: 01/17/2021, 10:41 AM  Clinical Narrative:     CSW heard from [facility] that they are having plumbing issues and are now not accepting new patients.   CSW spoke with patient's daughter Magda Paganini who is agreeable for CSW to reach out to [facility] to accept bed offer.   CSW spoke with Clyze at [facility], they are able to accept patient for private room and have started his insurance auth.   Expected Discharge Plan: [place] Barriers to Discharge: Continued Medical Work up  Expected Discharge Plan and Services Expected Discharge Plan: [place] Choice: [place] arrangements for the past 2 months: Single Family Home                                       Social Determinants of Health (SDOH) Interventions    Readmission Risk Interventions No flowsheet data found.

## 2021-01-17 NOTE — Progress Notes (Signed)
PROGRESS NOTE    Christopher [name]  CVE:[id number] DOB: 1928/03/29 DOA: 01/03/2021 PCP: [name], MD   Chief complaint.  Gross hematuria. Brief Narrative:  Christopher [name] is a 87 y.o. male with medical history significant for urothelial cancer of the bladder, history of atrial fibrillation on chronic anticoagulation therapy, hypertension, dyslipidemia who presents to the ER via EMS for evaluation of 2 witnessed syncopal episodes. Patient recently had cystoscopy with fulguration and cryosurgery for bladder cancer on 6/13.  He had a significant gross hematuria with a large amount of blood clots.  Evaluated by urology, started CBI. Cystoscopy and clot evacuation performed on 6/29. Developed rapid ventricular response with atrial fibrillation on 6/30.  Started on diltiazem drip, then transitioned to oral diltiazem.    Assessment & Plan:   Principal Problem:   Acute blood loss anemia Active Problems:   HTN (hypertension)   Benign prostatic hyperplasia with urinary frequency   Persistent atrial fibrillation (HCC)   Urothelial carcinoma of bladder (HCC)   Gross hematuria   Acute renal failure (ARF) (HCC)   Acute hypoxemic respiratory failure ([place])   Delirium  #1.  Delirium, resolved --cont seroquel 25 mg nightly  2.  Acute blood loss anemia. Hypovolemic shock. Iron deficient anemia. Gross hematuria secondary to bladder malignancy. status post cystoscopy and clot evacuation.   He received IV iron, 3u blood transfusion.   --Hgb stable in 9-10's Plan: --cont to hold Eliquis --cont iron suppl  Acute hypoxic respiratory failure Dyspnea, improved --on 3L O2, now weaned down to RA --CXR showed Patchy bilateral opacities.  BNP mildly elevated.  Procal neg, no fever or leukocytosis to suggest PNA.  Repeat covid neg. --started on more aggressive diuresis with IV lasix 40 mg BID plus metolazone  Plan: --Hold diuresis today --Strict I/O --Incentive spirometry   Acute on  chronic diastolic congestive heart failure. --hold diuresis today due to slight Cr bump --likely resume torsemide at discharge  Paroxysmal atrial fibrillation with rapid ventricle response. --home Toprol was not started on admission.  Dilt ordered by cardio. --rate increased to 130's overnight and persistent Plan: --cont cardizem 240 mg daily --cont Lopressor 50 mg BID --cont to hold Eliquis  Acute kidney injury secondary to obstruction. Hyponatremia  Hyperkalemia. Condition all resolved after cystoscopy.  General weakness. --PT/OT --discharge to SNF  BPH --cont Proscar and Flomax  Hypoalbumin --albumin 2.2 --Ensure TID --pt encouraged to consume more protein   DVT prophylaxis: Lovenox Code Status: full Family Communication:  Disposition Plan:    Status is: Inpatient  Remains inpatient appropriate because:Inpatient level of care appropriate due to severity of illness  Dispo: The patient is from: Home              Anticipated d/c is to: SNF              Patient currently is medically stable to d/c.  Whenever bed available.    Difficult to place patient No   I/O last 3 completed shifts: In: 840 [P.O.:840] Out: 1600 [Urine:1600] Total I/O In: 600 [P.O.:600] Out: 38 [Urine:275]     Consultants:  Cardiology  Procedures: None  Antimicrobials: None   Subjective: Pt reported feeling tired from PT workup earlier.  Eating better.   HR controlled and BP stable on increased rate control agents.   Objective: Vitals:   01/17/21 0411 01/17/21 0746 01/17/21 1100 01/17/21 1558  BP: 118/60 (!) 106/53 (!) 111/59 (!) 116/58  Pulse: 67 69 67 65  Resp: 18 18 18  18  Temp: 97.7 F (36.5 C) 97.9 F (36.6 C) 98.4 F (36.9 C) 98.4 F (36.9 C)  TempSrc:  Oral    SpO2: 92% 94% 95% 97%  Weight:      Height:        Intake/Output Summary (Last 24 hours) at 01/17/2021 1641 Last data filed at 01/17/2021 1555 Gross per 24 hour  Intake 840 ml  Output 525 ml  Net  315 ml   Filed Weights   01/03/21 2312 01/06/21 0429 01/14/21 0937  Weight: 90 kg 89.5 kg 81.2 kg    Examination:  Constitutional: NAD, AAOx3 HEENT: conjunctivae and lids normal, EOMI CV: No cyanosis.   RESP: normal respiratory effort, on RA Extremities: No effusions, edema in BLE SKIN: warm, dry Neuro: II - XII grossly intact.   Psych: Normal mood and affect.  Appropriate judgement and reason   Data Reviewed: I have personally reviewed following labs and imaging studies  CBC: Recent Labs  Lab 01/11/21 0718 01/12/21 0545 01/13/21 0708 01/14/21 0522 01/15/21 0559 01/16/21 0458 01/17/21 0542  WBC 11.6* 10.3 9.4 10.0 8.7 8.6 7.8  NEUTROABS 8.7* 7.4  --   --   --   --   --   HGB 9.9* 9.9* 10.4* 10.1* 10.5* 11.2* 10.6*  HCT 31.0* 30.6* 32.3* 31.4* 32.4* 34.5* 32.8*  MCV 97.2 97.1 95.3 96.9 95.3 93.8 96.2  PLT 242 250 278 294 305 310 735   Basic Metabolic Panel: Recent Labs  Lab 01/11/21 0718 01/12/21 0545 01/13/21 0708 01/14/21 0522 01/15/21 0559 01/16/21 0458 01/17/21 0542  NA 138   < > 139 138 137 138 136  K 3.7   < > 3.7 3.5 3.3* 3.7 3.4*  CL 102   < > 100 99 94* 96* 95*  CO2 31   < > 32 32 35* 36* 36*  GLUCOSE 101*   < > 111* 109* 122* 118* 123*  BUN 22   < > 23 24* 28* 33* 39*  CREATININE 1.07   < > 1.04 1.06 1.11 1.25* 1.28*  CALCIUM 8.1*   < > 8.2* 8.3* 8.1* 8.6* 8.9  MG 2.3   < > 2.2 2.1 2.3 2.3 2.4  PHOS 2.8  --   --   --   --   --   --    < > = values in this interval not displayed.   GFR: Estimated Creatinine Clearance: 40.9 mL/min (A) (by C-G formula based on SCr of 1.28 mg/dL (H)). Liver Function Tests: Recent Labs  Lab 01/14/21 0522  ALBUMIN 2.2*   No results for input(s): LIPASE, AMYLASE in the last 168 hours. No results for input(s): AMMONIA in the last 168 hours. Coagulation Profile: No results for input(s): INR, PROTIME in the last 168 hours. Cardiac Enzymes: No results for input(s): CKTOTAL, CKMB, CKMBINDEX, TROPONINI in the last  168 hours. BNP (last 3 results) No results for input(s): PROBNP in the last 8760 hours. HbA1C: No results for input(s): HGBA1C in the last 72 hours. CBG: No results for input(s): GLUCAP in the last 168 hours. Lipid Profile: No results for input(s): CHOL, HDL, LDLCALC, TRIG, CHOLHDL, LDLDIRECT in the last 72 hours. Thyroid Function Tests: No results for input(s): TSH, T4TOTAL, FREET4, T3FREE, THYROIDAB in the last 72 hours. Anemia Panel: No results for input(s): VITAMINB12, FOLATE, FERRITIN, TIBC, IRON, RETICCTPCT in the last 72 hours. Sepsis Labs: Recent Labs  Lab 01/13/21 0708  PROCALCITON <0.10    Recent Results (from the past 240 hour(s))  Resp Panel by RT-PCR (Flu A&B, Covid) Nasopharyngeal Swab     Status: None   Collection Time: 01/13/21  3:50 PM   Specimen: Nasopharyngeal Swab; Nasopharyngeal(NP) swabs in vial transport medium  Result Value Ref Range Status   SARS Coronavirus 2 by RT PCR NEGATIVE NEGATIVE Final    Comment: (NOTE) SARS-CoV-2 target nucleic acids are NOT DETECTED.  The SARS-CoV-2 RNA is generally detectable in upper respiratory specimens during the acute phase of infection. The lowest concentration of SARS-CoV-2 viral copies this assay can detect is 138 copies/mL. A negative result does not preclude SARS-Cov-2 infection and should not be used as the sole basis for treatment or other patient management decisions. A negative result may occur with  improper specimen collection/handling, submission of specimen other than nasopharyngeal swab, presence of viral mutation(s) within the areas targeted by this assay, and inadequate number of viral copies(<138 copies/mL). A negative result must be combined with clinical observations, patient history, and epidemiological information. The expected result is Negative.  Fact Sheet for Patients:  EntrepreneurPulse.com.au  Fact Sheet for Healthcare Providers:   IncredibleEmployment.be  This test is no t yet approved or cleared by the [place] FDA and  has been authorized for detection and/or diagnosis of SARS-CoV-2 by FDA under an Emergency Use Authorization (EUA). This EUA will remain  in effect (meaning this test can be used) for the duration of the COVID-19 declaration under Section 564(b)(1) of the Act, 21 U.S.C.section 360bbb-3(b)(1), unless the authorization is terminated  or revoked sooner.       Influenza A by PCR NEGATIVE NEGATIVE Final   Influenza B by PCR NEGATIVE NEGATIVE Final    Comment: (NOTE) The Xpert Xpress SARS-CoV-2/FLU/RSV plus assay is intended as an aid in the diagnosis of influenza from Nasopharyngeal swab specimens and should not be used as a sole basis for treatment. Nasal washings and aspirates are unacceptable for Xpert Xpress SARS-CoV-2/FLU/RSV testing.  Fact Sheet for Patients: EntrepreneurPulse.com.au  Fact Sheet for Healthcare Providers: IncredibleEmployment.be  This test is not yet approved or cleared by the [place] FDA and has been authorized for detection and/or diagnosis of SARS-CoV-2 by FDA under an Emergency Use Authorization (EUA). This EUA will remain in effect (meaning this test can be used) for the duration of the COVID-19 declaration under Section 564(b)(1) of the Act, 21 U.S.C. section 360bbb-3(b)(1), unless the authorization is terminated or revoked.  Performed at [facility], [address] [place], [place] [address]          Radiology Studies: [facility] Chest [place] 1 View  Result Date: 01/16/2021 CLINICAL DATA:  Dyspnea. EXAM: PORTABLE CHEST 1 VIEW COMPARISON:  01/12/2021 FINDINGS: Decreased inspiration with no significant change in borderline enlargement of the cardiac silhouette. Stable left subclavian bipolar pacemaker leads and neural stimulator leads. Stable diffuse prominence of the interstitial markings  with normal vascularity. No pleural fluid. Unremarkable bones. IMPRESSION: 1. No acute abnormality. 2. Stable cardiomegaly and chronic interstitial lung disease. Electronically Signed   By: Claudie Revering M.D.   On: 01/16/2021 17:22        Scheduled Meds:  alfuzosin  10 mg Oral Q breakfast   atorvastatin  10 mg Oral Daily   calcium carbonate  500 mg of elemental calcium Oral Q breakfast   cholecalciferol  2,000 Units Oral q AM   diltiazem  240 mg Oral Daily   enoxaparin (LOVENOX) injection  40 mg Subcutaneous Q24H   feeding supplement  237 mL Oral TID BM   ferrous  sulfate  325 mg Oral Q breakfast   finasteride  5 mg Oral Daily   metoprolol tartrate  50 mg Oral BID   multivitamin with minerals  1 tablet Oral q AM   QUEtiapine  25 mg Oral QHS   tamsulosin  0.4 mg Oral Daily   Continuous Infusions:   LOS: 14 days    Enzo Bi, MD [facility]   To contact the attending provider between 7A-7P or the covering provider during after hours 7P-7A, please log into the web site [email or web address] and access using universal [place] password for that web site. If you do not have the password, please call the hospital operator.  01/17/2021, 4:41 PM

## 2021-01-17 NOTE — Progress Notes (Addendum)
Physical Therapy Treatment Patient Details Name: Christopher [name] MRN: [id number] DOB: 02-Jan-1928 Today's Date: 01/17/2021    History of Present Illness Pt is a 87 yo male s/p cystoscopy with fulguration and cryotherapy 6/13/022, experienced two syncopal episodes and hematuria, after discharging home, returned to [facility]. 01/05/2021 underwent cystoscopy with clot evacuation and liberation.  He developed atrial fibrillation with rapid ventricular response and was placed on a diltiazem drip.  PMH of afib, bladder cancer, HLD, HTN, cardiac pacemaker, spinal cord stimulator    PT Comments    Pt alert, motivated for PT. Supervision for bed mobility, and min-guard, RW for transfers, and ambulation for safety. Pt noted lightheadedness up standing BP: 99/59, HR 68, SpO2: 88-90%. Attempted there ex sitting EOB with steady HR 68-72, but standing continued to induce symptoms of orthostatic hypotension BP: 99/52 second standing attempt, but patient unable to maintain standing >15 seconds to allow BP reading. Continued to therapeutic exercises in seated position to continue improving ROM and strength with no further symptoms. Primary limitations continue to be decreased activity tolerance with all standing mobility. Skilled PT intervention is indicated to address deficits in function, mobility, and to return to PLOF as able.  Current d/c recommendations remain SNF w/ supervision for OOB activity.    Follow Up Recommendations  SNF;Supervision for mobility/OOB     Equipment Recommendations  3in1 (PT)    Recommendations for Other Services       Precautions / Restrictions Precautions Precautions: Fall Precaution Comments: Monitor BP, HR w/ all activity Restrictions Weight Bearing Restrictions: No    Mobility  Bed Mobility Overal bed mobility: Needs Assistance Bed Mobility: Supine to Sit     Supine to sit: Supervision     General bed mobility comments: Modified Borg 5/10 w/ supine > sit     Transfers Overall transfer level: Needs assistance Equipment used: Rolling walker (2 wheeled) Transfers: Sit to/from Stand Sit to Stand: Min guard         General transfer comment: Lightheaded, sx do not subside  Ambulation/Gait Ambulation/Gait assistance: Min guard Gait Distance (Feet): 2 Feet Assistive device: Rolling walker (2 wheeled)       General Gait Details: Bed > Chair w/ RW, no further activity 2ndary to decreased exercice tolerance w/ static stance   Stairs             Wheelchair Mobility    Modified Rankin (Stroke Patients Only)       Balance Overall balance assessment: Needs assistance Sitting-balance support: Feet supported Sitting balance-Leahy Scale: Good     Standing balance support: Bilateral upper extremity supported Standing balance-Leahy Scale: Fair Standing balance comment: Requires BUE for safety                            Cognition Arousal/Alertness: Awake/alert Behavior During Therapy: WFL for tasks assessed/performed Overall Cognitive Status: Within Functional Limits for tasks assessed                                 General Comments: pleasant, motivated      Exercises General Exercises - Lower Extremity Ankle Circles/Pumps: AROM;Both;10 reps Long Arc Quad: AROM;Seated;Both;15 reps Hip Flexion/Marching: AROM;Seated;Both;10 reps    General Comments General comments (skin integrity, edema, etc.): Lightheadedness w/ all standing activity: (Seated) HR 68, BP 99/59, unable to access standing BP due to severity of sx; (seated, post-session) BP 109/47  Pertinent Vitals/Pain Pain Assessment: No/denies pain    Home Living                      Prior Function            PT Goals (current goals can now be found in the care plan section) Acute Rehab PT Goals Patient Stated Goal: to get stronger PT Goal Formulation: With patient Time For Goal Achievement: 01/21/21 Potential to Achieve  Goals: Good Progress towards PT goals: Not progressing toward goals - comment (Decreased activity tolerance w/ standing activity)    Frequency    Min 2X/week      PT Plan Current plan remains appropriate    Co-evaluation              AM-PAC PT "6 Clicks" Mobility   Outcome Measure  Help needed turning from your back to your side while in a flat bed without using bedrails?: A Little Help needed moving from lying on your back to sitting on the side of a flat bed without using bedrails?: A Little Help needed moving to and from a bed to a chair (including a wheelchair)?: A Little Help needed standing up from a chair using your arms (e.g., wheelchair or bedside chair)?: A Little Help needed to walk in hospital room?: A Lot Help needed climbing 3-5 steps with a railing? : A Lot 6 Click Score: 16    End of Session Equipment Utilized During Treatment: Gait belt Activity Tolerance: Treatment limited secondary to medical complications (Comment);Patient tolerated treatment well (Sx of lightheadedness) Patient left: in chair;with call bell/phone within reach;with chair alarm set Nurse Communication: Mobility status PT Visit Diagnosis: Other abnormalities of gait and mobility (R26.89);Difficulty in walking, not elsewhere classified (R26.2);Muscle weakness (generalized) (M62.81)     Time: 8338-2505 PT Time Calculation (min) (ACUTE ONLY): 33 min  Charges:                        [employer], SPT

## 2021-01-17 NOTE — Progress Notes (Signed)
[facility] Encounter Note  Patient: Christopher [name] / Admit Date: 01/03/2021 / Date of Encounter: 01/17/2021, 7:47 AM   Subjective: Patient feels well today with no evidence of significant cardiovascular symptoms.  Overall the patient has had a significant improvements in heart rate control of what appears to be atrial flutter at this time.  The patient has had a heart rate of 86 bpm at rest.  There is no evidence of significant congestive heart failure or anginal symptoms Review of Systems: Positive for: Shortness of breath Negative for: Vision change, hearing change, syncope, dizziness, nausea, vomiting,diarrhea, bloody stool, stomach pain, cough, congestion, diaphoresis, urinary frequency, urinary pain,skin lesions, skin rashes Others previously listed  Objective: Telemetry: Atrial flutter with controlled ventricular rate Physical Exam: Blood pressure 118/60, pulse 67, temperature 97.7 F (36.5 C), resp. rate 18, height 5\' 11"  (1.803 m), weight 81.2 kg, SpO2 92 %. Body mass index is 24.98 kg/m. General: Well developed, well nourished, in no acute distress. Head: Normocephalic, atraumatic, sclera non-icteric, no xanthomas, nares are without discharge. Neck: No apparent masses Lungs: Normal respirations with few wheezes, no rhonchi, no rales , no crackles   Heart: Irregularrate and rhythm, normal S1 S2, no murmur, no rub, no gallop, PMI is normal size and placement, carotid upstroke normal without bruit, jugular venous pressure normal Abdomen: Soft, non-tender, non-distended with normoactive bowel sounds. No hepatosplenomegaly. Abdominal aorta is normal size without bruit Extremities: No edema, no clubbing, no cyanosis, no ulcers,  Peripheral: 2+ radial, 2+ femoral, 2+ dorsal pedal pulses Neuro: [name] and oriented. Moves all extremities spontaneously. Psych:  Responds to questions appropriately with a normal affect.   Intake/Output Summary (Last 24 hours) at  01/17/2021 0747 Last data filed at 01/17/2021 0718 Gross per 24 hour  Intake 600 ml  Output 825 ml  Net -225 ml     Inpatient Medications:   alfuzosin  10 mg Oral Q breakfast   atorvastatin  10 mg Oral Daily   calcium carbonate  500 mg of elemental calcium Oral Q breakfast   cholecalciferol  2,000 Units Oral q AM   diltiazem  240 mg Oral Daily   enoxaparin (LOVENOX) injection  40 mg Subcutaneous Q24H   feeding supplement  237 mL Oral TID BM   ferrous sulfate  325 mg Oral Q breakfast   finasteride  5 mg Oral Daily   metoprolol tartrate  50 mg Oral BID   multivitamin with minerals  1 tablet Oral q AM   QUEtiapine  25 mg Oral QHS   tamsulosin  0.4 mg Oral Daily   Infusions:   Labs: Recent Labs    01/16/21 0458 01/17/21 0542  NA 138 136  K 3.7 3.4*  CL 96* 95*  CO2 36* 36*  GLUCOSE 118* 123*  BUN 33* 39*  CREATININE 1.25* 1.28*  CALCIUM 8.6* 8.9  MG 2.3 2.4    No results for input(s): AST, ALT, ALKPHOS, BILITOT, PROT, ALBUMIN in the last 72 hours.  Recent Labs    01/16/21 0458 01/17/21 0542  WBC 8.6 7.8  HGB 11.2* 10.6*  HCT 34.5* 32.8*  MCV 93.8 96.2  PLT 310 333    No results for input(s): CKTOTAL, CKMB, TROPONINI in the last 72 hours. Invalid input(s): POCBNP No results for input(s): HGBA1C in the last 72 hours.   Weights: Filed Weights   01/03/21 2312 01/06/21 0429 01/14/21 0937  Weight: 90 kg 89.5 kg 81.2 kg     Radiology/Studies:  DG Chest 2 View  Result Date: 01/12/2021 CLINICAL DATA:  Dyspnea EXAM: CHEST - 2 VIEW COMPARISON:  05/04/2020 FINDINGS: Left-sided pacing device with leads over the right atrium and right ventricle. Borderline cardiomegaly with aortic atherosclerosis. Coarse chronic interstitial disease. Patchy bilateral airspace opacities suspicious for superimposed pneumonia. No pneumothorax. Small pleural effusions on lateral view. IMPRESSION: 1. Patchy bilateral opacities suspicious for acute airspace disease/probable pneumonia  superimposed on underlying chronic interstitial changes. 2. Borderline cardiac size.  Small pleural effusions. Electronically Signed   By: Donavan Foil M.D.   On: 01/12/2021 20:44   CT HEAD WO CONTRAST  Result Date: 01/08/2021 CLINICAL DATA:  Delirium. EXAM: CT HEAD WITHOUT CONTRAST TECHNIQUE: Contiguous axial images were obtained from the base of the skull through the vertex without intravenous contrast. COMPARISON:  February 07, 2019. FINDINGS: Brain: Mild chronic ischemic white matter disease is noted. No mass effect or midline shift is noted. Ventricular size is within normal limits. There is no evidence of mass lesion, hemorrhage or acute infarction. Vascular: No hyperdense vessel or unexpected calcification. Skull: Normal. Negative for fracture or focal lesion. Sinuses/Orbits: No acute finding. Other: None. IMPRESSION: No acute intracranial abnormality seen. Electronically Signed   By: Marijo Conception M.D.   On: 01/08/2021 14:31   DG Chest Port 1 View  Result Date: 01/16/2021 CLINICAL DATA:  Dyspnea. EXAM: PORTABLE CHEST 1 VIEW COMPARISON:  01/12/2021 FINDINGS: Decreased inspiration with no significant change in borderline enlargement of the cardiac silhouette. Stable left subclavian bipolar pacemaker leads and neural stimulator leads. Stable diffuse prominence of the interstitial markings with normal vascularity. No pleural fluid. Unremarkable bones. IMPRESSION: 1. No acute abnormality. 2. Stable cardiomegaly and chronic interstitial lung disease. Electronically Signed   By: Claudie Revering M.D.   On: 01/16/2021 17:22   DG OR UROLOGY CYSTO IMAGE ([facility] ONLY)  Result Date: 01/05/2021 There is no interpretation for this exam.  This order is for images obtained during a surgical procedure.  Please See "Surgeries" Tab for more information regarding the procedure.   ECHOCARDIOGRAM COMPLETE  Result Date: 01/07/2021    ECHOCARDIOGRAM REPORT   Patient Name:   Christopher [name] Date of Exam: 01/07/2021 Medical  Rec #:  [id number]         Height:       71.0 in Accession #:    [id number]        Weight:       197.3 lb Date of Birth:  September 07, 1927         BSA:          2.096 m Patient Age:    89 years          BP:           111/83 mmHg Patient Gender: M                 HR:           65 bpm. Exam Location:  [facility] Procedure: 2D Echo, Cardiac Doppler and Color Doppler Indications:     CHF-acute diastolic E83.15  History:         Patient has prior history of Echocardiogram examinations, most                  recent 03/01/2015. Pacemaker, Arrythmias:Atrial Fibrillation;                  Risk Factors:Dyslipidemia.  Sonographer:     Sherrie Sport RDCS (AE) Referring Phys:  [id number] Sharen Hones Diagnosing Phys: Bartholome Bill MD  Sonographer Comments: Technically challenging study due to limited acoustic windows, suboptimal apical window and suboptimal subcostal window. IMPRESSIONS  1. Left ventricular ejection fraction, by estimation, is 70 to 75%. The left ventricle has hyperdynamic function. The left ventricle has no regional wall motion abnormalities. There is mild left ventricular hypertrophy. Left ventricular diastolic parameters are consistent with Grade I diastolic dysfunction (impaired relaxation).  2. Right ventricular systolic function is normal. The right ventricular size is normal.  3. Right atrial size was mildly dilated.  4. The mitral valve is grossly normal. Trivial mitral valve regurgitation.  5. The aortic valve is grossly normal. Aortic valve regurgitation is trivial. Mild to moderate aortic valve sclerosis/calcification is present, without any evidence of aortic stenosis. FINDINGS  Left Ventricle: Left ventricular ejection fraction, by estimation, is 70 to 75%. The left ventricle has hyperdynamic function. The left ventricle has no regional wall motion abnormalities. The left ventricular internal cavity size was normal in size. There is mild left ventricular hypertrophy. Left ventricular diastolic parameters are consistent  with Grade I diastolic dysfunction (impaired relaxation). Right Ventricle: The right ventricular size is normal. No increase in right ventricular wall thickness. Right ventricular systolic function is normal. Left Atrium: Left atrial size was normal in size. Right Atrium: Right atrial size was mildly dilated. Pericardium: There is no evidence of pericardial effusion. Mitral Valve: The mitral valve is grossly normal. Trivial mitral valve regurgitation. Tricuspid Valve: The tricuspid valve is grossly normal. Tricuspid valve regurgitation is trivial. Aortic Valve: The aortic valve is grossly normal. Aortic valve regurgitation is trivial. Mild to moderate aortic valve sclerosis/calcification is present, without any evidence of aortic stenosis. Aortic valve mean gradient measures 11.7 mmHg. Aortic valve peak gradient measures 21.6 mmHg. Aortic valve area, by VTI measures 1.57 cm. Pulmonic Valve: The pulmonic valve was not well visualized. Pulmonic valve regurgitation is not visualized. Aorta: The aortic root is normal in size and structure. IAS/Shunts: The atrial septum is grossly normal.  LEFT VENTRICLE PLAX 2D LVIDd:         3.71 cm  Diastology LVIDs:         2.03 cm  LV e' medial:    5.33 cm/s LV PW:         1.34 cm  LV E/e' medial:  21.6 LV IVS:        1.45 cm  LV e' lateral:   9.68 cm/s LVOT diam:     2.00 cm  LV E/e' lateral: 11.9 LV SV:         63 LV SV Index:   30 LVOT Area:     3.14 cm  RIGHT VENTRICLE RV S prime:     16.40 cm/s TAPSE (M-mode): 4.8 cm LEFT ATRIUM              Index       RIGHT ATRIUM           Index LA diam:        3.60 cm  1.72 cm/m  RA Area:     27.40 cm LA Vol (A2C):   74.0 ml  35.30 ml/m RA Volume:   85.00 ml  40.54 ml/m LA Vol (A4C):   119.0 ml 56.76 ml/m LA Biplane Vol: 103.0 ml 49.13 ml/m  AORTIC VALVE                    PULMONIC VALVE AV Area (Vmax):    1.66 cm     PV Vmax:  0.93 m/s AV Area (Vmean):   1.53 cm     PV Peak grad:   3.5 mmHg AV Area (VTI):     1.57 cm      RVOT Peak grad: 3 mmHg AV Vmax:           232.33 cm/s AV Vmean:          161.333 cm/s AV VTI:            0.397 m AV Peak Grad:      21.6 mmHg AV Mean Grad:      11.7 mmHg LVOT Vmax:         123.00 cm/s LVOT Vmean:        78.500 cm/s LVOT VTI:          0.199 m LVOT/AV VTI ratio: 0.50  AORTA Ao Root diam: 3.50 cm MITRAL VALVE                TRICUSPID VALVE MV Area (PHT): 3.54 cm     TR Peak grad:   18.8 mmHg MV Decel Time: 214 msec     TR Vmax:        217.00 cm/s MV E velocity: 115.00 cm/s MV A velocity: 68.60 cm/s   SHUNTS MV E/A ratio:  1.68         Systemic VTI:  0.20 m                             Systemic Diam: 2.00 cm Bartholome Bill MD Electronically signed by Bartholome Bill MD Signature Date/Time: 01/07/2021/3:19:25 PM    Final      Assessment and Recommendation  87 y.o. male with multiple medical problems and acute paroxysmal nonvalvular atrial fibrillation with rapid ventricular rate n now controlled with appropriate medication management and significantly improved with no evidence of congestive heart failure or acute coronary syndrome  1.  Continuation of current dosages of oral beta-blocker and diltiazem for outpatient control of atrial fibrillation and/or atrial flutter 2.  No further cardiac diagnostics necessary at this time 3.  No addition of anticoagulation at this time due to significant hematuria and side effects of bladder cancer 4.  If patient ambulating well with no further significant symptoms from the cardiovascular standpoint okay for discharged home with follow-up next week Signed, Serafina Royals M.D. FACC

## 2021-01-18 LAB — BASIC METABOLIC PANEL
Anion gap: 7 (ref 5–15)
BUN: 40 mg/dL — ABNORMAL HIGH (ref 8–23)
CO2: 35 mmol/L — ABNORMAL HIGH (ref 22–32)
Calcium: 9 mg/dL (ref 8.9–10.3)
Chloride: 95 mmol/L — ABNORMAL LOW (ref 98–111)
Creatinine, Ser: 1.14 mg/dL (ref 0.61–1.24)
GFR, Estimated: >60 mL/min (ref >60)
Glucose, Bld: 111 mg/dL — ABNORMAL HIGH (ref 70–99)
Potassium: 3.1 mmol/L — ABNORMAL LOW (ref 3.5–5.1)
Sodium: 137 mmol/L (ref 135–145)

## 2021-01-18 LAB — CBC
HCT: 32.3 % — ABNORMAL LOW (ref 39.0–52.0)
Hemoglobin: 10.4 g/dL — ABNORMAL LOW (ref 13.0–17.0)
MCH: 30.5 pg (ref 26.0–34.0)
MCHC: 32.2 g/dL (ref 30.0–36.0)
MCV: 94.7 fL (ref 80.0–100.0)
Platelets: 345 10*3/uL (ref 150–400)
RBC: 3.41 MIL/uL — ABNORMAL LOW (ref 4.22–5.81)
RDW: 14.5 % (ref 11.5–15.5)
WBC: 7.3 10*3/uL (ref 4.0–10.5)
nRBC: 0 % (ref 0.0–0.2)

## 2021-01-18 LAB — MAGNESIUM: Magnesium: 2.3 mg/dL (ref 1.7–2.4)

## 2021-01-18 MED ORDER — TORSEMIDE 20 MG PO TABS
40.0000 mg | ORAL_TABLET | Freq: Every day | ORAL | Status: DC
  Administered 2021-01-18: 40 mg via ORAL
  Filled 2021-01-18: qty 2

## 2021-01-18 MED ORDER — POTASSIUM CHLORIDE CRYS ER 20 MEQ PO TBCR
40.0000 meq | EXTENDED_RELEASE_TABLET | Freq: Once | ORAL | Status: AC
  Administered 2021-01-18: 40 meq via ORAL
  Filled 2021-01-18: qty 2

## 2021-01-18 NOTE — Progress Notes (Signed)
PROGRESS NOTE    Christopher [name]  CWC:[id number] DOB: 10/16/1927 DOA: 01/03/2021 PCP: [name], MD   Chief complaint.  Gross hematuria. Brief Narrative:  Christopher [name] is a 87 y.o. male with medical history significant for urothelial cancer of the bladder, history of atrial fibrillation on chronic anticoagulation therapy, hypertension, dyslipidemia who presents to the ER via EMS for evaluation of 2 witnessed syncopal episodes. Patient recently had cystoscopy with fulguration and cryosurgery for bladder cancer on 6/13.  He had a significant gross hematuria with a large amount of blood clots.  Evaluated by urology, started CBI. Cystoscopy and clot evacuation performed on 6/29. Developed rapid ventricular response with atrial fibrillation on 6/30.  Started on diltiazem drip, then transitioned to oral diltiazem.    Assessment & Plan:   Principal Problem:   Acute blood loss anemia Active Problems:   HTN (hypertension)   Benign prostatic hyperplasia with urinary frequency   Persistent atrial fibrillation (HCC)   Urothelial carcinoma of bladder (HCC)   Gross hematuria   Acute renal failure (ARF) (HCC)   Acute hypoxemic respiratory failure ([place])   Delirium   # Acute blood loss anemia. # Hypovolemic shock. # Iron deficient anemia. # Gross hematuria secondary to bladder malignancy. status post cystoscopy and clot evacuation.   He received IV iron, 3u blood transfusion.   --Hgb stable in 9-10's Plan: --cont to hold Eliquis --cont iron suppl  Acute hypoxic respiratory failure Dyspnea, resolved --on 3L O2, now weaned down to RA --CXR showed Patchy bilateral opacities.  BNP mildly elevated.  Procal neg, no fever or leukocytosis to suggest PNA.  Repeat covid neg. --started on more aggressive diuresis with IV lasix 60 mg BID plus metolazone from 7/7 to 01/15/21, with improvement in dyspnea and weaned down to RA. Plan: --Hold diuresis for now due to low BP --Incentive  spirometry   Acute on chronic diastolic congestive heart failure. --started on more aggressive diuresis with IV lasix 60 mg BID plus metolazone from 7/7 to 01/15/21, with improvement in dyspnea and weaned down to RA. Plan: --Hold diuresis for now due to low BP  Paroxysmal atrial fibrillation with rapid ventricle response. --home Toprol was not started on admission.  Dilt ordered by cardio. --rate increased to 130's overnight, cardizem increased and metop resumed on 7/10. Plan: --cont cardizem 240 mg daily --cont Lopressor 50 mg BID --cont to hold Eliquis  Acute kidney injury secondary to obstruction. Hyponatremia  Hyperkalemia. Condition all resolved after cystoscopy.  General weakness. --PT/OT --discharge to SNF  BPH --cont Proscar and alfuzosin  --d/c Flomax today since pt is on alfuzosin, per pharm rec  Hypoalbumin --albumin 2.2 --Ensure TID --pt encouraged to consume more protein  Delirium, resolved --started on seroquel 25 mg nightly which improved sleep and delirium --d/c seroqeul today to help with orthostasis, per pharm rec.  Intermittent orthostasis --likely due in large part to deconditioning and borderline low BP while sitting in the setting of rate control agents.  Also has low albumin. Plan: --PT/OT for physical conditioning --Hold diuretic for now --avoid IVF --d/c Seroquel and Flomax today (both can contribute to orthostasis) --improve protein intake  --try compression stocking   DVT prophylaxis: Lovenox Code Status: full Family Communication: son-in-law updated at bedside today Disposition Plan:    Status is: Inpatient  Remains inpatient appropriate because:Inpatient level of care appropriate due to severity of illness  Dispo: The patient is from: Home  Anticipated d/c is to: SNF              Patient currently is medically stable to d/c.  Whenever bed available.    Difficult to place patient No   I/O last 3 completed  shifts: In: 55 [P.O.:1320] Out: 49 [Urine:1075] Total I/O In: -  Out: 32 [Urine:550]     Consultants:  Cardiology  Procedures: None  Antimicrobials: None   Subjective: Pt had orthostatic BP drop while working with OT, from 100's sitting to 70's upon standing.  Pt reported dizziness.  Later, pt worked with PT, and was able to walk and BP remained stable.   Objective: Vitals:   01/18/21 0418 01/18/21 0725 01/18/21 1141 01/18/21 1631  BP: 109/62 110/63 125/63 (!) 120/57  Pulse: 66 66 68 (!) 56  Resp: 18   16  Temp: 97.6 F (36.4 C) 97.7 F (36.5 C) (!) 97.3 F (36.3 C) 97.8 F (36.6 C)  TempSrc:  Oral Oral   SpO2: 95% 96% 100% 97%  Weight:      Height:        Intake/Output Summary (Last 24 hours) at 01/18/2021 1654 Last data filed at 01/18/2021 1600 Gross per 24 hour  Intake 480 ml  Output 1100 ml  Net -620 ml   Filed Weights   01/03/21 2312 01/06/21 0429 01/14/21 0937  Weight: 90 kg 89.5 kg 81.2 kg    Examination:  Constitutional: NAD, AAOx3 HEENT: conjunctivae and lids normal, EOMI CV: No cyanosis.   RESP: normal respiratory effort, on RA Extremities: No effusions, edema in BLE SKIN: warm, dry Neuro: II - XII grossly intact.   Psych: Normal mood and affect.  Appropriate judgement and reason   Data Reviewed: I have personally reviewed following labs and imaging studies  CBC: Recent Labs  Lab 01/12/21 0545 01/13/21 0708 01/14/21 0522 01/15/21 0559 01/16/21 0458 01/17/21 0542 01/18/21 0730  WBC 10.3   < > 10.0 8.7 8.6 7.8 7.3  NEUTROABS 7.4  --   --   --   --   --   --   HGB 9.9*   < > 10.1* 10.5* 11.2* 10.6* 10.4*  HCT 30.6*   < > 31.4* 32.4* 34.5* 32.8* 32.3*  MCV 97.1   < > 96.9 95.3 93.8 96.2 94.7  PLT 250   < > 294 305 310 333 345   < > = values in this interval not displayed.   Basic Metabolic Panel: Recent Labs  Lab 01/14/21 0522 01/15/21 0559 01/16/21 0458 01/17/21 0542 01/18/21 0730  NA 138 137 138 136 137  K 3.5  3.3* 3.7 3.4* 3.1*  CL 99 94* 96* 95* 95*  CO2 32 35* 36* 36* 35*  GLUCOSE 109* 122* 118* 123* 111*  BUN 24* 28* 33* 39* 40*  CREATININE 1.06 1.11 1.25* 1.28* 1.14  CALCIUM 8.3* 8.1* 8.6* 8.9 9.0  MG 2.1 2.3 2.3 2.4 2.3   GFR: Estimated Creatinine Clearance: 45.9 mL/min (by C-G formula based on SCr of 1.14 mg/dL). Liver Function Tests: Recent Labs  Lab 01/14/21 0522  ALBUMIN 2.2*   No results for input(s): LIPASE, AMYLASE in the last 168 hours. No results for input(s): AMMONIA in the last 168 hours. Coagulation Profile: No results for input(s): INR, PROTIME in the last 168 hours. Cardiac Enzymes: No results for input(s): CKTOTAL, CKMB, CKMBINDEX, TROPONINI in the last 168 hours. BNP (last 3 results) No results for input(s): PROBNP in the last 8760 hours. HbA1C: No results for input(s): HGBA1C  in the last 72 hours. CBG: No results for input(s): GLUCAP in the last 168 hours. Lipid Profile: No results for input(s): CHOL, HDL, LDLCALC, TRIG, CHOLHDL, LDLDIRECT in the last 72 hours. Thyroid Function Tests: No results for input(s): TSH, T4TOTAL, FREET4, T3FREE, THYROIDAB in the last 72 hours. Anemia Panel: No results for input(s): VITAMINB12, FOLATE, FERRITIN, TIBC, IRON, RETICCTPCT in the last 72 hours. Sepsis Labs: Recent Labs  Lab 01/13/21 0708  PROCALCITON <0.10    Recent Results (from the past 240 hour(s))  Resp Panel by RT-PCR (Flu A&B, Covid) Nasopharyngeal Swab     Status: None   Collection Time: 01/13/21  3:50 PM   Specimen: Nasopharyngeal Swab; Nasopharyngeal(NP) swabs in vial transport medium  Result Value Ref Range Status   SARS Coronavirus 2 by RT PCR NEGATIVE NEGATIVE Final    Comment: (NOTE) SARS-CoV-2 target nucleic acids are NOT DETECTED.  The SARS-CoV-2 RNA is generally detectable in upper respiratory specimens during the acute phase of infection. The lowest concentration of SARS-CoV-2 viral copies this assay can detect is 138 copies/mL. A negative  result does not preclude SARS-Cov-2 infection and should not be used as the sole basis for treatment or other patient management decisions. A negative result may occur with  improper specimen collection/handling, submission of specimen other than nasopharyngeal swab, presence of viral mutation(s) within the areas targeted by this assay, and inadequate number of viral copies(<138 copies/mL). A negative result must be combined with clinical observations, patient history, and epidemiological information. The expected result is Negative.  Fact Sheet for Patients:  EntrepreneurPulse.com.au  Fact Sheet for Healthcare Providers:  IncredibleEmployment.be  This test is no t yet approved or cleared by the [place] FDA and  has been authorized for detection and/or diagnosis of SARS-CoV-2 by FDA under an Emergency Use Authorization (EUA). This EUA will remain  in effect (meaning this test can be used) for the duration of the COVID-19 declaration under Section 564(b)(1) of the Act, 21 U.S.C.section 360bbb-3(b)(1), unless the authorization is terminated  or revoked sooner.       Influenza A by PCR NEGATIVE NEGATIVE Final   Influenza B by PCR NEGATIVE NEGATIVE Final    Comment: (NOTE) The Xpert Xpress SARS-CoV-2/FLU/RSV plus assay is intended as an aid in the diagnosis of influenza from Nasopharyngeal swab specimens and should not be used as a sole basis for treatment. Nasal washings and aspirates are unacceptable for Xpert Xpress SARS-CoV-2/FLU/RSV testing.  Fact Sheet for Patients: EntrepreneurPulse.com.au  Fact Sheet for Healthcare Providers: IncredibleEmployment.be  This test is not yet approved or cleared by the [place] FDA and has been authorized for detection and/or diagnosis of SARS-CoV-2 by FDA under an Emergency Use Authorization (EUA). This EUA will remain in effect (meaning this test can be used)  for the duration of the COVID-19 declaration under Section 564(b)(1) of the Act, 21 U.S.C. section 360bbb-3(b)(1), unless the authorization is terminated or revoked.  Performed at [facility], [address], [place], [place] [address]          Radiology Studies: No results found.      Scheduled Meds:  alfuzosin  10 mg Oral Q breakfast   atorvastatin  10 mg Oral Daily   calcium carbonate  500 mg of elemental calcium Oral Q breakfast   cholecalciferol  2,000 Units Oral q AM   diltiazem  240 mg Oral Daily   enoxaparin (LOVENOX) injection  40 mg Subcutaneous Q24H   feeding supplement  237 mL Oral TID BM  ferrous sulfate  325 mg Oral Q breakfast   finasteride  5 mg Oral Daily   metoprolol tartrate  50 mg Oral BID   multivitamin with minerals  1 tablet Oral q AM   Continuous Infusions:   LOS: 15 days    Enzo Bi, MD [facility]   To contact the attending provider between 7A-7P or the covering provider during after hours 7P-7A, please log into the web site [email or web address] and access using universal [place] password for that web site. If you do not have the password, please call the hospital operator.  01/18/2021, 4:54 PM

## 2021-01-18 NOTE — Progress Notes (Signed)
[facility] Encounter Note  Patient: Christopher [name] / Admit Date: 01/03/2021 / Date of Encounter: 01/18/2021, 8:42 AM   Subjective: Patient feels well today with no evidence of significant cardiovascular symptoms.  Overall the patient has had a significant improvements in heart rate control of what appears to be atrial flutter at this time.  The patient has had a heart rate of 86 bpm at rest.  There is no evidence of significant congestive heart failure or anginal symptoms Review of Systems: Positive for: Shortness of breath Negative for: Vision change, hearing change, syncope, dizziness, nausea, vomiting,diarrhea, bloody stool, stomach pain, cough, congestion, diaphoresis, urinary frequency, urinary pain,skin lesions, skin rashes Others previously listed  Objective: Telemetry: Atrial flutter with controlled ventricular rate Physical Exam: Blood pressure 110/63, pulse 66, temperature 97.7 F (36.5 C), temperature source Oral, resp. rate 18, height 5\' 11"  (1.803 m), weight 81.2 kg, SpO2 96 %. Body mass index is 24.98 kg/m. General: Well developed, well nourished, in no acute distress. Head: Normocephalic, atraumatic, sclera non-icteric, no xanthomas, nares are without discharge. Neck: No apparent masses Lungs: Normal respirations with few wheezes, no rhonchi, no rales , no crackles   Heart: Irregularrate and rhythm, normal S1 S2, no murmur, no rub, no gallop, PMI is normal size and placement, carotid upstroke normal without bruit, jugular venous pressure normal Abdomen: Soft, non-tender, non-distended with normoactive bowel sounds. No hepatosplenomegaly. Abdominal aorta is normal size without bruit Extremities: No edema, no clubbing, no cyanosis, no ulcers,  Peripheral: 2+ radial, 2+ femoral, 2+ dorsal pedal pulses Neuro: [name] and oriented. Moves all extremities spontaneously. Psych:  Responds to questions appropriately with a normal affect.   Intake/Output  Summary (Last 24 hours) at 01/18/2021 0842 Last data filed at 01/18/2021 0730 Gross per 24 hour  Intake 1080 ml  Output 850 ml  Net 230 ml     Inpatient Medications:   alfuzosin  10 mg Oral Q breakfast   atorvastatin  10 mg Oral Daily   calcium carbonate  500 mg of elemental calcium Oral Q breakfast   cholecalciferol  2,000 Units Oral q AM   diltiazem  240 mg Oral Daily   enoxaparin (LOVENOX) injection  40 mg Subcutaneous Q24H   feeding supplement  237 mL Oral TID BM   ferrous sulfate  325 mg Oral Q breakfast   finasteride  5 mg Oral Daily   metoprolol tartrate  50 mg Oral BID   multivitamin with minerals  1 tablet Oral q AM   QUEtiapine  25 mg Oral QHS   tamsulosin  0.4 mg Oral Daily   Infusions:   Labs: Recent Labs    01/17/21 0542 01/18/21 0730  NA 136 137  K 3.4* 3.1*  CL 95* 95*  CO2 36* 35*  GLUCOSE 123* 111*  BUN 39* 40*  CREATININE 1.28* 1.14  CALCIUM 8.9 9.0  MG 2.4 2.3    No results for input(s): AST, ALT, ALKPHOS, BILITOT, PROT, ALBUMIN in the last 72 hours.  Recent Labs    01/17/21 0542 01/18/21 0730  WBC 7.8 7.3  HGB 10.6* 10.4*  HCT 32.8* 32.3*  MCV 96.2 94.7  PLT 333 345    No results for input(s): CKTOTAL, CKMB, TROPONINI in the last 72 hours. Invalid input(s): POCBNP No results for input(s): HGBA1C in the last 72 hours.   Weights: Filed Weights   01/03/21 2312 01/06/21 0429 01/14/21 0937  Weight: 90 kg 89.5 kg 81.2 kg     Radiology/Studies:  DG  Chest 2 View  Result Date: 01/12/2021 CLINICAL DATA:  Dyspnea EXAM: CHEST - 2 VIEW COMPARISON:  05/04/2020 FINDINGS: Left-sided pacing device with leads over the right atrium and right ventricle. Borderline cardiomegaly with aortic atherosclerosis. Coarse chronic interstitial disease. Patchy bilateral airspace opacities suspicious for superimposed pneumonia. No pneumothorax. Small pleural effusions on lateral view. IMPRESSION: 1. Patchy bilateral opacities suspicious for acute airspace  disease/probable pneumonia superimposed on underlying chronic interstitial changes. 2. Borderline cardiac size.  Small pleural effusions. Electronically Signed   By: Donavan Foil M.D.   On: 01/12/2021 20:44   CT HEAD WO CONTRAST  Result Date: 01/08/2021 CLINICAL DATA:  Delirium. EXAM: CT HEAD WITHOUT CONTRAST TECHNIQUE: Contiguous axial images were obtained from the base of the skull through the vertex without intravenous contrast. COMPARISON:  February 07, 2019. FINDINGS: Brain: Mild chronic ischemic white matter disease is noted. No mass effect or midline shift is noted. Ventricular size is within normal limits. There is no evidence of mass lesion, hemorrhage or acute infarction. Vascular: No hyperdense vessel or unexpected calcification. Skull: Normal. Negative for fracture or focal lesion. Sinuses/Orbits: No acute finding. Other: None. IMPRESSION: No acute intracranial abnormality seen. Electronically Signed   By: Marijo Conception M.D.   On: 01/08/2021 14:31   DG Chest Port 1 View  Result Date: 01/16/2021 CLINICAL DATA:  Dyspnea. EXAM: PORTABLE CHEST 1 VIEW COMPARISON:  01/12/2021 FINDINGS: Decreased inspiration with no significant change in borderline enlargement of the cardiac silhouette. Stable left subclavian bipolar pacemaker leads and neural stimulator leads. Stable diffuse prominence of the interstitial markings with normal vascularity. No pleural fluid. Unremarkable bones. IMPRESSION: 1. No acute abnormality. 2. Stable cardiomegaly and chronic interstitial lung disease. Electronically Signed   By: Claudie Revering M.D.   On: 01/16/2021 17:22   DG OR UROLOGY CYSTO IMAGE ([facility] ONLY)  Result Date: 01/05/2021 There is no interpretation for this exam.  This order is for images obtained during a surgical procedure.  Please See "Surgeries" Tab for more information regarding the procedure.   ECHOCARDIOGRAM COMPLETE  Result Date: 01/07/2021    ECHOCARDIOGRAM REPORT   Patient Name:   Christopher [name] Date  of Exam: 01/07/2021 Medical Rec #:  [id number]         Height:       71.0 in Accession #:    [id number]        Weight:       197.3 lb Date of Birth:  July 08, 1928         BSA:          2.096 m Patient Age:    87 years          BP:           111/83 mmHg Patient Gender: M                 HR:           65 bpm. Exam Location:  [facility] Procedure: 2D Echo, Cardiac Doppler and Color Doppler Indications:     CHF-acute diastolic A25.05  History:         Patient has prior history of Echocardiogram examinations, most                  recent 03/01/2015. Pacemaker, Arrythmias:Atrial Fibrillation;                  Risk Factors:Dyslipidemia.  Sonographer:     Sherrie Sport RDCS (AE) Referring Phys:  [id number] Sharen Hones Diagnosing  Phys: Bartholome Bill MD  Sonographer Comments: Technically challenging study due to limited acoustic windows, suboptimal apical window and suboptimal subcostal window. IMPRESSIONS  1. Left ventricular ejection fraction, by estimation, is 70 to 75%. The left ventricle has hyperdynamic function. The left ventricle has no regional wall motion abnormalities. There is mild left ventricular hypertrophy. Left ventricular diastolic parameters are consistent with Grade I diastolic dysfunction (impaired relaxation).  2. Right ventricular systolic function is normal. The right ventricular size is normal.  3. Right atrial size was mildly dilated.  4. The mitral valve is grossly normal. Trivial mitral valve regurgitation.  5. The aortic valve is grossly normal. Aortic valve regurgitation is trivial. Mild to moderate aortic valve sclerosis/calcification is present, without any evidence of aortic stenosis. FINDINGS  Left Ventricle: Left ventricular ejection fraction, by estimation, is 70 to 75%. The left ventricle has hyperdynamic function. The left ventricle has no regional wall motion abnormalities. The left ventricular internal cavity size was normal in size. There is mild left ventricular hypertrophy. Left ventricular diastolic  parameters are consistent with Grade I diastolic dysfunction (impaired relaxation). Right Ventricle: The right ventricular size is normal. No increase in right ventricular wall thickness. Right ventricular systolic function is normal. Left Atrium: Left atrial size was normal in size. Right Atrium: Right atrial size was mildly dilated. Pericardium: There is no evidence of pericardial effusion. Mitral Valve: The mitral valve is grossly normal. Trivial mitral valve regurgitation. Tricuspid Valve: The tricuspid valve is grossly normal. Tricuspid valve regurgitation is trivial. Aortic Valve: The aortic valve is grossly normal. Aortic valve regurgitation is trivial. Mild to moderate aortic valve sclerosis/calcification is present, without any evidence of aortic stenosis. Aortic valve mean gradient measures 11.7 mmHg. Aortic valve peak gradient measures 21.6 mmHg. Aortic valve area, by VTI measures 1.57 cm. Pulmonic Valve: The pulmonic valve was not well visualized. Pulmonic valve regurgitation is not visualized. Aorta: The aortic root is normal in size and structure. IAS/Shunts: The atrial septum is grossly normal.  LEFT VENTRICLE PLAX 2D LVIDd:         3.71 cm  Diastology LVIDs:         2.03 cm  LV e' medial:    5.33 cm/s LV PW:         1.34 cm  LV E/e' medial:  21.6 LV IVS:        1.45 cm  LV e' lateral:   9.68 cm/s LVOT diam:     2.00 cm  LV E/e' lateral: 11.9 LV SV:         63 LV SV Index:   30 LVOT Area:     3.14 cm  RIGHT VENTRICLE RV S prime:     16.40 cm/s TAPSE (M-mode): 4.8 cm LEFT ATRIUM              Index       RIGHT ATRIUM           Index LA diam:        3.60 cm  1.72 cm/m  RA Area:     27.40 cm LA Vol (A2C):   74.0 ml  35.30 ml/m RA Volume:   85.00 ml  40.54 ml/m LA Vol (A4C):   119.0 ml 56.76 ml/m LA Biplane Vol: 103.0 ml 49.13 ml/m  AORTIC VALVE                    PULMONIC VALVE AV Area (Vmax):    1.66 cm     PV  Vmax:        0.93 m/s AV Area (Vmean):   1.53 cm     PV Peak grad:   3.5 mmHg AV Area  (VTI):     1.57 cm     RVOT Peak grad: 3 mmHg AV Vmax:           232.33 cm/s AV Vmean:          161.333 cm/s AV VTI:            0.397 m AV Peak Grad:      21.6 mmHg AV Mean Grad:      11.7 mmHg LVOT Vmax:         123.00 cm/s LVOT Vmean:        78.500 cm/s LVOT VTI:          0.199 m LVOT/AV VTI ratio: 0.50  AORTA Ao Root diam: 3.50 cm MITRAL VALVE                TRICUSPID VALVE MV Area (PHT): 3.54 cm     TR Peak grad:   18.8 mmHg MV Decel Time: 214 msec     TR Vmax:        217.00 cm/s MV E velocity: 115.00 cm/s MV A velocity: 68.60 cm/s   SHUNTS MV E/A ratio:  1.68         Systemic VTI:  0.20 m                             Systemic Diam: 2.00 cm Bartholome Bill MD Electronically signed by Bartholome Bill MD Signature Date/Time: 01/07/2021/3:19:25 PM    Final      Assessment and Recommendation  87 y.o. male with multiple medical problems and acute paroxysmal nonvalvular atrial fibrillation with rapid ventricular rate n now controlled with appropriate medication management and significantly improved with no evidence of congestive heart failure or acute coronary syndrome  1.  Continuation of current dosages of oral beta-blocker and diltiazem for outpatient control of atrial fibrillation and/or atrial flutter 2.  No further cardiac diagnostics necessary at this time 3.  No addition of anticoagulation at this time due to significant hematuria and side effects of bladder cancer 4.  If patient ambulating well with no further significant symptoms from the cardiovascular standpoint okay for discharged home with follow-up next week Signed, Serafina Royals M.D. FACC

## 2021-01-18 NOTE — Evaluation (Signed)
Occupational Therapy Re-Evaluation Patient Details Name: Christopher [name] MRN: [id number] DOB: 21-Oct-1927 Today's Date: 01/18/2021    History of Present Illness Pt is a 87 yo male s/p cystoscopy with fulguration and cryotherapy 6/13/022, experienced two syncopal episodes and hematuria, after discharging home, returned to [place]. 01/05/2021 underwent cystoscopy with clot evacuation and liberation.  He developed atrial fibrillation with rapid ventricular response and was placed on a diltiazem drip.  PMH of afib, bladder cancer, HLD, HTN, cardiac pacemaker, spinal cord stimulator   Clinical Impression   Upon entering the room, pt seated in recliner chair with son in law present in the room. Pt very pleasant and agreeable to therapeutic intervention. Pt standing from recliner chair with min guard and reports feeling dizzy and returns to chair. BP check with results of 106/58 and pt standing a second time long enough to take BP in standing with results of 78/43. Pt is symptomatic and reports vision being fuzzy and returns to chair. RN and MD notified. Pt able to perform 2 sets of 5 chair push ups with supervision for safety and HR increased to 125 bpm. Pt requests to remain in recliner chair. Pt making steady progress towards OT goals and continues to benefit from OT intervention to address functional deficits. Continued recommendation for short term rehab before returning home.     Follow Up Recommendations  SNF;Supervision/Assistance - 24 hour    Equipment Recommendations  Other (comment) (defer to next venue of care)       Precautions / Restrictions Precautions Precautions: Fall Precaution Comments: Monitor BP, HR Restrictions Weight Bearing Restrictions: No      Mobility Bed Mobility Overal bed mobility: Needs Assistance Bed Mobility: Supine to Sit     Supine to sit: Supervision Sit to supine: Supervision   General bed mobility comments: Pt seated in recliner chair     Transfers Overall transfer level: Needs assistance Equipment used: Rolling walker (2 wheeled) Transfers: Sit to/from Stand Sit to Stand: Min guard Stand pivot transfers: Min guard       General transfer comment: CGA for safety    Balance Overall balance assessment: Needs assistance Sitting-balance support: No upper extremity supported;Feet supported Sitting balance-Leahy Scale: Good Sitting balance - Comments: good sitting balance while sitting on BSC   Standing balance support: Bilateral upper extremity supported;During functional activity Standing balance-Leahy Scale: [place] Patient Visual Report: No change from baseline              Pertinent Vitals/Pain Pain Assessment: No/denies pain Pain Score: 0-No pain           Communication     Cognition Arousal/Alertness: Awake/alert Behavior During Therapy: WFL for tasks assessed/performed Overall Cognitive Status: Within Functional Limits for tasks assessed                                 General Comments: pleasant, motivated              OT Goals(Current goals can be found in the care plan section) Acute Rehab OT Goals Patient Stated Goal: get better so I can get home OT Goal Formulation: With patient Time For Goal Achievement: 01/25/21 Potential to Achieve Goals: Good  OT Frequency: Min 2X/week    AM-PAC OT "6  Clicks" Daily Activity     Outcome Measure Help from another person eating meals?: None Help from another person taking care of personal grooming?: A Little Help from another person toileting, which includes using toliet, bedpan, or urinal?: A Little Help from another person bathing (including washing, rinsing, drying)?: A Little Help from another person to put on and taking off regular upper body clothing?: A Little Help from another person to put on and taking off regular lower body clothing?: A Little 6 Click Score: 19   End  of Session Nurse Communication: Other (comment) (BP)  Activity Tolerance: Patient tolerated treatment well Patient left: in chair;with call bell/phone within reach;with chair alarm set;with family/visitor present  OT Visit Diagnosis: Unsteadiness on feet (R26.81);Repeated falls (R29.6);Muscle weakness (generalized) (M62.81)                Time: 1164-3539 OT Time Calculation (min): 25 min Charges:  OT General Charges $OT Visit: 1 Visit OT Evaluation $OT Re-eval: 1 Re-eval OT Treatments $Therapeutic Activity: 23-37 mins  Darleen Crocker, MS, OTR/L , CBIS ascom [phone]  01/18/21, 4:46 PM

## 2021-01-18 NOTE — Progress Notes (Signed)
Physical Therapy Treatment Patient Details Name: Christopher [name] MRN: [id number] DOB: October 20, 1927 Today's Date: 01/18/2021    History of Present Illness Pt is a 87 yo male s/p cystoscopy with fulguration and cryotherapy 6/13/022, experienced two syncopal episodes and hematuria, after discharging home, returned to [place]. 01/05/2021 underwent cystoscopy with clot evacuation and liberation.  He developed atrial fibrillation with rapid ventricular response and was placed on a diltiazem drip.  PMH of afib, bladder cancer, HLD, HTN, cardiac pacemaker, spinal cord stimulator    PT Comments    Pt was long sitting in bed with supportive family in room. He agrees to session and is extremely motivated. BP at rest 106/55. Upon sitting EOB BP 94/54. Pt was asymptomatic. Performed several exercises while seated EOB prior to standing and ambulating with RW to doorway of room and back. He tolerated ambulation well however does endorse fatigue. Peak HR during session 74 BPM. BP after ambulation 90/50. Overall pt is progressing from a PT standpoint. Continued recommendation for DC to SNF to progress pt to PLOF. Will closely follow and progress as able per pt tolerance. Will benefit from rehab for amount of therapy he would receive versus [place] services.     Follow Up Recommendations  SNF;Supervision for mobility/OOB     Equipment Recommendations  3in1 (PT)       Precautions / Restrictions Precautions Precautions: Fall Precaution Comments: Monitor BP, HR Restrictions Weight Bearing Restrictions: No    Mobility  Bed Mobility Overal bed mobility: Needs Assistance Bed Mobility: Supine to Sit     Supine to sit: Supervision Sit to supine: Supervision   General bed mobility comments: BP at rest in bed; 106/55. Sitting EOB 94/54. Pt reports no symptoms. HR stable between 61-74 during session.    Transfers Overall transfer level: Needs assistance Equipment used: Rolling walker (2 wheeled) Transfers:  Sit to/from Stand Sit to Stand: Min guard         General transfer comment: CGA for safety. Much improved strength from previously observed  Ambulation/Gait Ambulation/Gait assistance: Min guard Gait Distance (Feet): 25 Feet Assistive device: Rolling walker (2 wheeled) Gait Pattern/deviations: WFL(Within Functional Limits) Gait velocity: decrease   General Gait Details: Pt was able to ambulate 25 ft with RW. NO LOB however distance limited by fatigue. HR elevated to 74bpm at max      Balance Overall balance assessment: Needs assistance Sitting-balance support: No upper extremity supported;Feet supported Sitting balance-Leahy Scale: Good     Standing balance support: Bilateral upper extremity supported;During functional activity Standing balance-Leahy Scale: Fair       Cognition Arousal/Alertness: Awake/alert Behavior During Therapy: WFL for tasks assessed/performed Overall Cognitive Status: Within Functional Limits for tasks assessed      General Comments: pleasant, motivated             Pertinent Vitals/Pain Pain Assessment: No/denies pain Pain Score: 0-No pain     PT Goals (current goals can now be found in the care plan section) Acute Rehab PT Goals Patient Stated Goal: get better so I can get home Progress towards PT goals: Progressing toward goals    Frequency    Min 2X/week      PT Plan Current plan remains appropriate       AM-PAC PT "6 Clicks" Mobility   Outcome Measure  Help needed turning from your back to your side while in a flat bed without using bedrails?: A Little Help needed moving from lying on your back to sitting on the side of a  flat bed without using bedrails?: A Little Help needed moving to and from a bed to a chair (including a wheelchair)?: A Little Help needed standing up from a chair using your arms (e.g., wheelchair or bedside chair)?: A Little Help needed to walk in hospital room?: A Lot Help needed climbing 3-5 steps  with a railing? : A Lot 6 Click Score: 16    End of Session Equipment Utilized During Treatment: Gait belt Activity Tolerance: Patient tolerated treatment well;Patient limited by fatigue;Other (comment) (close cardiac monitor/ BP monitoring) Patient left: in bed;with call bell/phone within reach;with bed alarm set;with family/visitor present Nurse Communication: Mobility status PT Visit Diagnosis: Other abnormalities of gait and mobility (R26.89);Difficulty in walking, not elsewhere classified (R26.2);Muscle weakness (generalized) (M62.81)     Time: 3729-0211 PT Time Calculation (min) (ACUTE ONLY): 27 min  Charges:  $Gait Training: 8-22 mins $Therapeutic Activity: 8-22 mins                     Julaine Fusi PTA 01/18/21, 4:36 PM

## 2021-01-19 DIAGNOSIS — R531 Weakness: Secondary | ICD-10-CM

## 2021-01-19 LAB — CBC
HCT: 33.4 % — ABNORMAL LOW (ref 39.0–52.0)
Hemoglobin: 10.6 g/dL — ABNORMAL LOW (ref 13.0–17.0)
MCH: 31 pg (ref 26.0–34.0)
MCHC: 31.7 g/dL (ref 30.0–36.0)
MCV: 97.7 fL (ref 80.0–100.0)
Platelets: 363 10*3/uL (ref 150–400)
RBC: 3.42 MIL/uL — ABNORMAL LOW (ref 4.22–5.81)
RDW: 14.6 % (ref 11.5–15.5)
WBC: 7.6 10*3/uL (ref 4.0–10.5)
nRBC: 0 % (ref 0.0–0.2)

## 2021-01-19 LAB — BASIC METABOLIC PANEL
Anion gap: 9 (ref 5–15)
BUN: 43 mg/dL — ABNORMAL HIGH (ref 8–23)
CO2: 34 mmol/L — ABNORMAL HIGH (ref 22–32)
Calcium: 9 mg/dL (ref 8.9–10.3)
Chloride: 95 mmol/L — ABNORMAL LOW (ref 98–111)
Creatinine, Ser: 1.22 mg/dL (ref 0.61–1.24)
GFR, Estimated: 56 mL/min — ABNORMAL LOW (ref >60)
Glucose, Bld: 110 mg/dL — ABNORMAL HIGH (ref 70–99)
Potassium: 3.7 mmol/L (ref 3.5–5.1)
Sodium: 138 mmol/L (ref 135–145)

## 2021-01-19 LAB — MAGNESIUM: Magnesium: 2.1 mg/dL (ref 1.7–2.4)

## 2021-01-19 MED ORDER — DOCUSATE SODIUM 100 MG PO CAPS
200.0000 mg | ORAL_CAPSULE | Freq: Two times a day (BID) | ORAL | Status: DC | PRN
  Administered 2021-01-19: 200 mg via ORAL
  Filled 2021-01-19: qty 2

## 2021-01-19 MED ORDER — MELATONIN 5 MG PO TABS
5.0000 mg | ORAL_TABLET | Freq: Every day | ORAL | Status: DC
  Administered 2021-01-19: 5 mg via ORAL
  Filled 2021-01-19: qty 1

## 2021-01-19 NOTE — Progress Notes (Signed)
PROGRESS NOTE    Christopher [name]  IRJ:[id number] DOB: 02-03-1928 DOA: 01/03/2021 PCP: [name], MD  Assessment & Plan:   Principal Problem:   Acute blood loss anemia Active Problems:   HTN (hypertension)   Benign prostatic hyperplasia with urinary frequency   Persistent atrial fibrillation (HCC)   Urothelial carcinoma of bladder (HCC)   Gross hematuria   Acute renal failure (ARF) (HCC)   Acute hypoxemic respiratory failure (HCC)   Delirium  Hypovolemic shock: likely secondary to gross hematuria secondary to bladder cancer. S/p cystoscopy & clot evacuation as per urology. S/p 3 units of pRBCs transfused. Continue to monitor H&H. Continue to hold eliquis. Shock has resolved   Acute blood loss anemia: s/p 3 units of pRBCs transfused. Secondary to above. Continue to monitor H&H  Acute hypoxic respiratory failure: resolved   Acute on chronic diastolic CHF exacerbation: s/p IV lasix given. Monitor I/Os and daily weights. Cardio following   PAF: w/ intermittent RVR. Continue on metoprolol, cardizem. Continue to hold eliquis. Continue on tele. Cardio following  AKI: likely secondary to obstruction from clot in bladder. Resolved  Generalized weakness: PT/OT recs SNF initially and today recs HH   BPH: continue on home dose of proscar, alfuzosin  Hypoalbuminemia: continue w/ nutritional support   Delirium: resolved   Intermittent orthostasis: likely secondary to deconditioning & taking metoprolol, cardizem. Continue w/ compression stocking   DVT prophylaxis: lovenox  Code Status:  full  Family Communication:  Disposition Plan: likely d/c back home   Level of care: Progressive Cardiac  Status is: Inpatient  Remains inpatient appropriate because:Unsafe d/c plan, IV treatments appropriate due to intensity of illness or inability to take PO, and Inpatient level of care appropriate due to severity of illness  Dispo: The patient is from: Home              Anticipated  d/c is to: Home              Patient currently is not medically stable to d/c.   Difficult to place patient : unclear        Consultants:  Cardio   Procedures:   Antimicrobials:    Subjective: Pt c/o fatigue   Objective: Vitals:   01/18/21 1631 01/18/21 2040 01/19/21 0350 01/19/21 0759  BP: (!) 120/57 (!) 107/57 (!) 103/56 102/60  Pulse: (!) 56 63 66 61  Resp: 16 18 18 17   Temp: 97.8 F (36.6 C) 98.3 F (36.8 C) 97.7 F (36.5 C) 97.6 F (36.4 C)  TempSrc:  Oral Oral Oral  SpO2: 97% 96% 96% 93%  Weight:      Height:        Intake/Output Summary (Last 24 hours) at 01/19/2021 0844 Last data filed at 01/19/2021 0756 Gross per 24 hour  Intake 120 ml  Output 1175 ml  Net -1055 ml   Filed Weights   01/03/21 2312 01/06/21 0429 01/14/21 0937  Weight: 90 kg 89.5 kg 81.2 kg    Examination:  General exam: Appears calm and comfortable  Respiratory system: diminished breath sounds b/l  Cardiovascular system: irregularly irregular. No rubs, gallops or clicks.  Gastrointestinal system: Abdomen is nondistended, soft and nontender. Normal bowel sounds heard. Central nervous system: Alert and oriented. Moves all extremities Psychiatry: Judgement and insight appear normal. Flat mood and affect    Data Reviewed: I have personally reviewed following labs and imaging studies  CBC: Recent Labs  Lab 01/15/21 0559 01/16/21 0458 01/17/21 0542 01/18/21 0730 01/19/21 3016  WBC 8.7 8.6 7.8 7.3 7.6  HGB 10.5* 11.2* 10.6* 10.4* 10.6*  HCT 32.4* 34.5* 32.8* 32.3* 33.4*  MCV 95.3 93.8 96.2 94.7 97.7  PLT 305 310 333 345 099   Basic Metabolic Panel: Recent Labs  Lab 01/15/21 0559 01/16/21 0458 01/17/21 0542 01/18/21 0730 01/19/21 0648  NA 137 138 136 137 138  K 3.3* 3.7 3.4* 3.1* 3.7  CL 94* 96* 95* 95* 95*  CO2 35* 36* 36* 35* 34*  GLUCOSE 122* 118* 123* 111* 110*  BUN 28* 33* 39* 40* 43*  CREATININE 1.11 1.25* 1.28* 1.14 1.22  CALCIUM 8.1* 8.6* 8.9 9.0 9.0   MG 2.3 2.3 2.4 2.3 2.1   GFR: Estimated Creatinine Clearance: 42.9 mL/min (by C-G formula based on SCr of 1.22 mg/dL). Liver Function Tests: Recent Labs  Lab 01/14/21 0522  ALBUMIN 2.2*   No results for input(s): LIPASE, AMYLASE in the last 168 hours. No results for input(s): AMMONIA in the last 168 hours. Coagulation Profile: No results for input(s): INR, PROTIME in the last 168 hours. Cardiac Enzymes: No results for input(s): CKTOTAL, CKMB, CKMBINDEX, TROPONINI in the last 168 hours. BNP (last 3 results) No results for input(s): PROBNP in the last 8760 hours. HbA1C: No results for input(s): HGBA1C in the last 72 hours. CBG: No results for input(s): GLUCAP in the last 168 hours. Lipid Profile: No results for input(s): CHOL, HDL, LDLCALC, TRIG, CHOLHDL, LDLDIRECT in the last 72 hours. Thyroid Function Tests: No results for input(s): TSH, T4TOTAL, FREET4, T3FREE, THYROIDAB in the last 72 hours. Anemia Panel: No results for input(s): VITAMINB12, FOLATE, FERRITIN, TIBC, IRON, RETICCTPCT in the last 72 hours. Sepsis Labs: Recent Labs  Lab 01/13/21 0708  PROCALCITON <0.10    Recent Results (from the past 240 hour(s))  Resp Panel by RT-PCR (Flu A&B, Covid) Nasopharyngeal Swab     Status: None   Collection Time: 01/13/21  3:50 PM   Specimen: Nasopharyngeal Swab; Nasopharyngeal(NP) swabs in vial transport medium  Result Value Ref Range Status   SARS Coronavirus 2 by RT PCR NEGATIVE NEGATIVE Final    Comment: (NOTE) SARS-CoV-2 target nucleic acids are NOT DETECTED.  The SARS-CoV-2 RNA is generally detectable in upper respiratory specimens during the acute phase of infection. The lowest concentration of SARS-CoV-2 viral copies this assay can detect is 138 copies/mL. A negative result does not preclude SARS-Cov-2 infection and should not be used as the sole basis for treatment or other patient management decisions. A negative result may occur with  improper specimen  collection/handling, submission of specimen other than nasopharyngeal swab, presence of viral mutation(s) within the areas targeted by this assay, and inadequate number of viral copies(<138 copies/mL). A negative result must be combined with clinical observations, patient history, and epidemiological information. The expected result is Negative.  Fact Sheet for Patients:  EntrepreneurPulse.com.au  Fact Sheet for Healthcare Providers:  IncredibleEmployment.be  This test is no t yet approved or cleared by the [place] FDA and  has been authorized for detection and/or diagnosis of SARS-CoV-2 by FDA under an Emergency Use Authorization (EUA). This EUA will remain  in effect (meaning this test can be used) for the duration of the COVID-19 declaration under Section 564(b)(1) of the Act, 21 U.S.C.section 360bbb-3(b)(1), unless the authorization is terminated  or revoked sooner.       Influenza A by PCR NEGATIVE NEGATIVE Final   Influenza B by PCR NEGATIVE NEGATIVE Final    Comment: (NOTE) The Xpert Xpress SARS-CoV-2/FLU/RSV plus assay is  intended as an aid in the diagnosis of influenza from Nasopharyngeal swab specimens and should not be used as a sole basis for treatment. Nasal washings and aspirates are unacceptable for Xpert Xpress SARS-CoV-2/FLU/RSV testing.  Fact Sheet for Patients: EntrepreneurPulse.com.au  Fact Sheet for Healthcare Providers: IncredibleEmployment.be  This test is not yet approved or cleared by the [place] FDA and has been authorized for detection and/or diagnosis of SARS-CoV-2 by FDA under an Emergency Use Authorization (EUA). This EUA will remain in effect (meaning this test can be used) for the duration of the COVID-19 declaration under Section 564(b)(1) of the Act, 21 U.S.C. section 360bbb-3(b)(1), unless the authorization is terminated or revoked.  Performed at [facility], [address], [place], [place] [address]          Radiology Studies: No results found.      Scheduled Meds:  alfuzosin  10 mg Oral Q breakfast   atorvastatin  10 mg Oral Daily   calcium carbonate  500 mg of elemental calcium Oral Q breakfast   cholecalciferol  2,000 Units Oral q AM   diltiazem  240 mg Oral Daily   enoxaparin (LOVENOX) injection  40 mg Subcutaneous Q24H   feeding supplement  237 mL Oral TID BM   ferrous sulfate  325 mg Oral Q breakfast   finasteride  5 mg Oral Daily   metoprolol tartrate  50 mg Oral BID   multivitamin with minerals  1 tablet Oral q AM   Continuous Infusions:   LOS: 16 days    Time spent: 33 mins    Wyvonnia Dusky, MD [facility] Pager [phone]  If 7PM-7AM, please contact night-coverage 01/19/2021, 8:44 AM

## 2021-01-19 NOTE — Progress Notes (Addendum)
Physical Therapy Treatment Patient Details Name: Christopher [name] MRN: [id number] DOB: Mar 12, 1928 Today's Date: 01/19/2021    History of Present Illness Pt is a 87 yo male s/p cystoscopy with fulguration and cryotherapy 6/13/022, experienced two syncopal episodes and hematuria, after discharging home, returned to [place]. 01/05/2021 underwent cystoscopy with clot evacuation and liberation.  He developed atrial fibrillation with rapid ventricular response and was placed on a diltiazem drip.  PMH of afib, bladder cancer, HLD, HTN, cardiac pacemaker, spinal cord stimulator    PT Comments    Pt was long sitting in bed with supportive son in law in room. He agrees to PT session and was cooperative and motivated throughout. On Rm ait throughout session with sao2 >96%. HR was around 70-80 bpm in bed at rest. HR was in A fib throughout most of session with peak HR at 140 bpm during ambulation 63ft in hallway. Quickly resolves to 90s with seated rest. Pt did not show any signs/symptoms of orthostatic hypotension. Lengthy discussion with patient/family members/ OT about DC disposition. Acute PT changing recs from SNF at DC to home with HHPT. Pt has a very supportive family and demonstrates safe enough abilities to successfully go home with assistance's. Will attempt stairs next session and continue to follow pt per current POC. He will benefit from HHPT to continue to progressing strength/balance/ and overall activity tolerance.     Follow Up Recommendations  Home health PT;Supervision for mobility/OOB     Equipment Recommendations  3in1 (PT)       Precautions / Restrictions Precautions Precautions: Fall Precaution Comments: Monitor BP, HR Restrictions Weight Bearing Restrictions: No    Mobility  Bed Mobility Overal bed mobility: Needs Assistance Bed Mobility: Supine to Sit     Supine to sit: Supervision Sit to supine: Supervision        Transfers Overall transfer level: Needs  assistance Equipment used: Rolling walker (2 wheeled) Transfers: Sit to/from Stand Sit to Stand: Supervision            Ambulation/Gait Ambulation/Gait assistance: Min guard;Supervision Gait Distance (Feet): 75 Feet Assistive device: Rolling walker (2 wheeled) Gait Pattern/deviations: WFL(Within Functional Limits) Gait velocity: decrease   General Gait Details: Pt was able to ambulate  1x 8ft and 1 x 75 ft with RW. HR was in A fib. Peak HR 140bpm but adveraged ~ 80-90 BPM       Balance Overall balance assessment: Needs assistance Sitting-balance support: No upper extremity supported;Feet supported Sitting balance-Leahy Scale: Good     Standing balance support: Bilateral upper extremity supported;During functional activity Standing balance-Leahy Scale: Good Standing balance comment: Pt was able to stand and ambulate with RW without LOB. does tend to ambulate with narrow BOS.          Cognition Arousal/Alertness: Awake/alert Behavior During Therapy: WFL for tasks assessed/performed Overall Cognitive Status: Within Functional Limits for tasks assessed        General Comments: pleasant, motivated             Pertinent Vitals/Pain Pain Assessment: No/denies pain     PT Goals (current goals can now be found in the care plan section) Acute Rehab PT Goals Patient Stated Goal: get better so I can get home Progress towards PT goals: Progressing toward goals    Frequency    Min 2X/week      PT Plan Discharge plan needs to be updated       AM-PAC PT "6 Clicks" Mobility   Outcome Measure  Help needed  turning from your back to your side while in a flat bed without using bedrails?: A Little Help needed moving from lying on your back to sitting on the side of a flat bed without using bedrails?: A Little Help needed moving to and from a bed to a chair (including a wheelchair)?: A Little Help needed standing up from a chair using your arms (e.g., wheelchair or  bedside chair)?: A Little Help needed to walk in hospital room?: A Little Help needed climbing 3-5 steps with a railing? : A Little 6 Click Score: 18    End of Session Equipment Utilized During Treatment: Gait belt Activity Tolerance: Patient tolerated treatment well Patient left: in chair;with call bell/phone within reach;with chair alarm set;with family/visitor present Nurse Communication: Mobility status PT Visit Diagnosis: Other abnormalities of gait and mobility (R26.89);Difficulty in walking, not elsewhere classified (R26.2);Muscle weakness (generalized) (M62.81)     Time: 4720-7218 PT Time Calculation (min) (ACUTE ONLY): 38 min  Charges:  $Gait Training: 8-22 mins $Therapeutic Activity: 23-37 mins                    Julaine Fusi PTA 01/19/21, 1:50 PM

## 2021-01-19 NOTE — Progress Notes (Signed)
Occupational Therapy Treatment Patient Details Name: Christopher [name] MRN: [id number] DOB: 12-Apr-1928 Today's Date: 01/19/2021    History of present illness Pt is a 87 yo male s/p cystoscopy with fulguration and cryotherapy 6/13/022, experienced two syncopal episodes and hematuria, after discharging [facility] [place] 01/05/2021 underwent cystoscopy with clot evacuation and liberation.  He developed atrial fibrillation with rapid ventricular response and was placed on a diltiazem drip.  PMH of afib, bladder cancer, HLD, HTN, cardiac pacemaker, spinal cord stimulator   OT comments  Pt seen for OT tx this date to f/u re: safety with ADLs/ADL mobility. OT engages pt in STS with SUPV with RW and fxl mobility to restroom with only one cue for safety. Pt demos good control for STS from elevated commode. OT engages pt in bathing tasks and pt able to stand for peri care with cue of grab bar with CGA/SETUP. Pt requires MIN A for posterior LB bathing. Pt able to complete UB bathing and dressing in sitting with SETUP, and requires MIN A to thread socks. For standing clothing mgt over hips, pt only requires CGA/SUPV for stabilizing. No c/o dizziness with standing ADLs/fxl mobility this date. Returned to chair with chair alarm. Family member present throughout. Will continue to follow. Pt has progressed with OT goals and family reports they will coordinate home supervision, as such, OT updated recommendation to home with home health.    Follow Up Recommendations  Home health OT;Supervision/Assistance - 24 hour (pt to d/c home with dtr and spouse)    Equipment Recommendations  Other (comment)    Recommendations for Other Services      Precautions / Restrictions Precautions Precautions: Fall Precaution Comments: Monitor BP, HR Restrictions Weight Bearing Restrictions: No       Mobility Bed Mobility               General bed mobility comments: up to chair pre/post     Transfers Overall transfer level: Needs assistance Equipment used: Rolling walker (2 wheeled) Transfers: Sit to/from Stand Sit to Stand: Supervision         General transfer comment: one cue for hand placement and pt able to carryover    Balance Overall balance assessment: Needs assistance Sitting-balance support: No upper extremity supported;Feet supported Sitting balance-Leahy Scale: Good Sitting balance - Comments: G static   Standing balance support: Bilateral upper extremity supported;During functional activity Standing balance-Leahy Scale: Good Standing balance comment: requires UE support, but able to perform fxl mobiltiy w/o physical assist this date, decreased c/o dizziness.                           ADL either performed or assessed with clinical judgement   ADL Overall ADL's : Needs assistance/impaired     Grooming: Wash/dry hands;Wash/dry face;Standing Grooming Details (indicate cue type and reason): with LRAD, sink side with SUPV Upper Body Bathing: Set up;Sitting   Lower Body Bathing: Min guard;Supervison/ safety;Sit to/from stand Lower Body Bathing Details (indicate cue type and reason): with LRAD for peri area Upper Body Dressing : Set up;Sitting   Lower Body Dressing: Min guard;Sit to/from stand Lower Body Dressing Details (indicate cue type and reason): to simulate clothing mgt over hips Toilet Transfer: Supervision/safety;BSC;Grab bars Toilet Transfer Details (indicate cue type and reason): BSC placed over toilet to raise, RW for fxl mobility to/from restroom, one cue for hand placement and pt able to complete commode transfer w/o physical assist Toileting- Clothing Manipulation and Hygiene:  Min guard;Sit to/from stand Toileting - Clothing Manipulation Details (indicate cue type and reason): to manage urinal for standing voiding     Functional mobility during ADLs: Min guard;Rolling walker (to/from restroom)       Vision Patient Visual  Report: No change from baseline     Perception     Praxis      Cognition Arousal/Alertness: Awake/alert Behavior During Therapy: WFL for tasks assessed/performed Overall Cognitive Status: Within Functional Limits for tasks assessed                                 General Comments: pleasant, motivated        Exercises Other Exercises Other Exercises: OT engages pt in fxl mobility to/from restroom and bathing/dressing and toileting tasks   Shoulder Instructions       General Comments no dizziness/light headdedness with coming to standing reported today.    Pertinent Vitals/ Pain       Pain Assessment: No/denies pain  Home Living                                          Prior Functioning/Environment              Frequency  Min 2X/week        Progress Toward Goals  OT Goals(current goals can now be found in the care plan section)  Progress towards OT goals: Progressing toward goals  Acute Rehab OT Goals Patient Stated Goal: get better so I can get home OT Goal Formulation: With patient Time For Goal Achievement: 01/25/21 Potential to Achieve Goals: Good  Plan Discharge plan remains appropriate;Frequency remains appropriate    Co-evaluation                 AM-PAC OT "6 Clicks" Daily Activity     Outcome Measure   Help from another person eating meals?: None Help from another person taking care of personal grooming?: A Little Help from another person toileting, which includes using toliet, bedpan, or urinal?: A Little Help from another person bathing (including washing, rinsing, drying)?: A Little Help from another person to put on and taking off regular upper body clothing?: None Help from another person to put on and taking off regular lower body clothing?: A Little 6 Click Score: 20    End of Session Equipment Utilized During Treatment: Gait belt;Rolling walker  OT Visit Diagnosis: Unsteadiness on feet  (R26.81);Repeated falls (R29.6);Muscle weakness (generalized) (M62.81)   Activity Tolerance Patient tolerated treatment well   Patient Left in chair;with call bell/phone within reach;with chair alarm set;with family/visitor present   Nurse Communication          Time: 5852-7782 OT Time Calculation (min): 39 min  Charges: OT General Charges $OT Visit: 1 Visit OT Treatments $Self Care/Home Management : 23-37 mins $Therapeutic Activity: 8-22 mins  Gerrianne Scale, Price, OTR/L ascom [phone] 01/19/21, 6:53 PM

## 2021-01-19 NOTE — TOC Progression Note (Addendum)
Transition of Care [facility]) - Progression Note    Patient Details  Name: EKIN PILAR MRN: [id number] Date of Birth: 1928/03/09  Transition of Care [facility]) CM/SW [place], [place] Phone Number: 01/19/2021, 2:37 PM  Clinical Narrative:     Update: [facility] has accepted for home health services.   CSW notes recommendations are now for home health services. CSW spoke with patient's daughter Magda Paganini in agreement with this and no preference of [place] agency.   Magda Paganini does report needing 3in1 to be delivered by [facility] to hospital room.   CSW has reached out to [place] with [facility] for [facility] PT and RN, pending acceptance at this time.   At time of discharge patient will go to daughter Leslie's home :  [place], [place]  Will need EMS transport at time of discharge, DME orders for 3in1 and orders for Home Health PT and RN.   Expected Discharge Plan: [place] Barriers to Discharge: Continued Medical Work up  Expected Discharge Plan and Services Expected Discharge Plan: [place] Choice: [place] arrangements for the past 2 months: Single Family Home                 DME Arranged: 3-N-1 DME Agency: [facility]       HH Arranged: PT, RN [place] Agency: [place] Date [place]: 01/19/21 Time [place]: 1436 Representative spoke with at [place]: [place] ([place]) Interventions    Readmission Risk Interventions No flowsheet data found.

## 2021-01-20 DIAGNOSIS — I480 Paroxysmal atrial fibrillation: Secondary | ICD-10-CM

## 2021-01-20 LAB — BASIC METABOLIC PANEL
Anion gap: 9 (ref 5–15)
BUN: 46 mg/dL — ABNORMAL HIGH (ref 8–23)
CO2: 33 mmol/L — ABNORMAL HIGH (ref 22–32)
Calcium: 9.1 mg/dL (ref 8.9–10.3)
Chloride: 96 mmol/L — ABNORMAL LOW (ref 98–111)
Creatinine, Ser: 1.08 mg/dL (ref 0.61–1.24)
GFR, Estimated: >60 mL/min (ref >60)
Glucose, Bld: 125 mg/dL — ABNORMAL HIGH (ref 70–99)
Potassium: 3.3 mmol/L — ABNORMAL LOW (ref 3.5–5.1)
Sodium: 138 mmol/L (ref 135–145)

## 2021-01-20 LAB — CBC
HCT: 33.5 % — ABNORMAL LOW (ref 39.0–52.0)
Hemoglobin: 10.6 g/dL — ABNORMAL LOW (ref 13.0–17.0)
MCH: 30.3 pg (ref 26.0–34.0)
MCHC: 31.6 g/dL (ref 30.0–36.0)
MCV: 95.7 fL (ref 80.0–100.0)
Platelets: 387 10*3/uL (ref 150–400)
RBC: 3.5 MIL/uL — ABNORMAL LOW (ref 4.22–5.81)
RDW: 14.7 % (ref 11.5–15.5)
WBC: 6.6 10*3/uL (ref 4.0–10.5)
nRBC: 0 % (ref 0.0–0.2)

## 2021-01-20 LAB — MAGNESIUM: Magnesium: 2.3 mg/dL (ref 1.7–2.4)

## 2021-01-20 MED ORDER — POTASSIUM CHLORIDE CRYS ER 20 MEQ PO TBCR
40.0000 meq | EXTENDED_RELEASE_TABLET | Freq: Once | ORAL | Status: AC
  Administered 2021-01-20: 40 meq via ORAL
  Filled 2021-01-20: qty 2

## 2021-01-20 MED ORDER — METOPROLOL TARTRATE 50 MG PO TABS: 50.0000 mg | ORAL_TABLET | Freq: Two times a day (BID) | ORAL | 0 refills | Status: AC

## 2021-01-20 MED ORDER — DILTIAZEM HCL ER COATED BEADS 240 MG PO CP24: 240.0000 mg | ORAL_CAPSULE | Freq: Every day | ORAL | 0 refills | Status: AC

## 2021-01-20 NOTE — TOC Transition Note (Signed)
Transition of Care [facility]) - CM/SW Discharge Note   Patient Details  Name: Christopher [name] MRN: [id number] Date of Birth: 1928-06-30  Transition of [facility]) CM/SW Contact:  Alberteen Sam, LCSW Phone Number: 01/20/2021, 1:09 PM   Clinical Narrative:     Patient to discharge home with home health services. Patient is set up with [facility] for PT and RN, Clarise Cruz with [facility] informed of discharge today. Patient has 3in1 that was delivered to room by [facility]. Daughter Magda Paganini aware of dc today.   No other discharge needs identified at this time.    Final next level of care: [place] Barriers to Discharge: No Barriers Identified   Patient Goals and CMS Choice Patient states their goals for this hospitalization and ongoing recovery are:: to go home CMS Medicare.gov Compare Post Acute Care list provided to:: Patient Represenative (must comment) (daughter) Choice offered to / list presented to : Adult Children  Discharge Placement                Patient to be transferred to facility by: family Name of family member notified: Magda Paganini Patient and family notified of of transfer: 01/20/21  Discharge Plan and Services     Post Acute Care Choice: Home Health          DME Arranged: 3-N-1 DME Agency: [facility] Date DME Agency Contacted: 01/20/21 Time DME Agency Contacted: 14 Representative spoke with at DME Agency: [place]: PT, RN [facility] Agency: [place] Date [place]: 01/20/21 Time [place]: 37 Representative spoke with at [place]: [place] ([place]) Interventions     Readmission Risk Interventions No flowsheet data found.

## 2021-01-20 NOTE — Plan of Care (Signed)
  Problem: Education: Goal: Knowledge of General Education information will improve Description: Including pain rating scale, medication(s)/side effects and non-pharmacologic comfort measures Outcome: Progressing   Problem: Health Behavior/Discharge Planning: Goal: Ability to manage health-related needs will improve Outcome: Progressing   Problem: Clinical Measurements: Goal: Respiratory complications will improve Outcome: Progressing   Problem: Clinical Measurements: Goal: Cardiovascular complication will be avoided Outcome: Progressing   Problem: Activity: Goal: Risk for activity intolerance will decrease Outcome: Progressing   Problem: Elimination: Goal: Will not experience complications related to bowel motility Outcome: Progressing Goal: Will not experience complications related to urinary retention Outcome: Progressing   Problem: Elimination: Goal: Will not experience complications related to urinary retention Outcome: Progressing   Problem: Safety: Goal: Ability to remain free from injury will improve Outcome: Progressing

## 2021-01-20 NOTE — Discharge Summary (Addendum)
Physician Discharge Summary  Christopher [name] DVV:[id number] DOB: [birth year] DOA: 01/03/2021  PCP: [name], MD  Admit date: 01/03/2021 Discharge date: 01/20/2021  Admitted From: home  Disposition:  home w/ home health   Recommendations for Outpatient Follow-up:  Follow up with PCP in 1-2 weeks F/u w/ urology in 1 week F/u w/ oncology in 1 week F/u w/ cardio, Dr. Ubaldo Glassing, in 1-2 weeks  Home Health: yes Equipment/Devices:  Discharge Condition: stable CODE STATUS: full  Diet recommendation: Heart Healthy  Brief/Interim Summary: HPI was taken from Dr. Francine Graven: Christopher [name] is a 87 y.o. male with medical history significant for urothelial cancer of the bladder, history of atrial fibrillation on chronic anticoagulation therapy, hypertension, dyslipidemia who presents to the ER via EMS for evaluation of 2 witnessed syncopal episodes. Patient had a cystoscopy with fulguration and cryosurgery on 12/20/20 for his known urothelial cancer of the bladder.  Per his daughter he was discharged home with a Foley catheter that was removed 2 days later without any issues besides frequent urination.  1 day prior to his presentation to the ER patient developed hematuria and had passed multiple clots at home.  He had 2 syncopal episodes, one while walking to the bathroom and another witnessed by EMS as they tried to get into the stretcher. He complains of feeling dizzy and lightheaded and complains of severe pain around the penile head. He denies having any chest pain, no shortness of breath, no nausea, no vomiting, no palpitations, no diaphoresis, no headache, no blurred vision, no focal deficits, no abdominal pain. Labs show sodium 132, potassium 4.1, chloride 99, bicarb 26, glucose 133, BUN 20, creatinine 1.07, calcium 8.6, white count 7.9, hemoglobin 8.9 compared to baseline of 13.1 a week ago, hematocrit 26.7, MCV 97.1, RDW 13.2, platelet count 227 Respiratory viral panel is  negative Twelve-lead EKG reviewed by me shows sinus rhythm with PVCs.     ED Course: Patient is a 87 year old male with a history of urothelial cancer of the bladder status post recent cystoscopy with fulguration and cryosurgery.  He presents to the ER via EMS for evaluation of 2 witnessed syncopal episodes as well as gross hematuria. CBI was initiated in the ER but patient continues to have gross hematuria.  Her He has had a significant drop in his H&H from 13.1g/dl a week ago to 8.9g/dl. He will be admitted to the hospital for further evaluation.   As per Dr. [name]: Christopher [name] is a 87 y.o. male with medical history significant for urothelial cancer of the bladder, history of atrial fibrillation on chronic anticoagulation therapy, hypertension, dyslipidemia who presents to the ER via EMS for evaluation of 2 witnessed syncopal episodes. Patient recently had cystoscopy with fulguration and cryosurgery for bladder cancer on 6/13.  He had a significant gross hematuria with a large amount of blood clots.  Evaluated by urology, started CBI. Cystoscopy and clot evacuation performed on 6/29. Developed rapid ventricular response with atrial fibrillation on 6/30.  Started on diltiazem drip, then transitioned to oral diltiazem.   As per Dr. Jimmye Norman 7/13-7/14/22: The hypovolemic shock had resolved by the time I saw the pt. Pt's was taken off eliquis secondary to gross hematuria and bladder clot that was removed by urology. Pt and pt's family both understand that the pt is at increased risk for CVA w/ hx of PAF and not being on anticoagulation. PT/OT initially recommended SNF and then changed their recommendations to [facility]. Home health was set up by CM  prior to d/c. For more information, please see previous progress/consult notes.   Discharge Diagnoses:  Principal Problem:   Acute blood loss anemia Active Problems:   HTN (hypertension)   Benign prostatic hyperplasia with urinary frequency   Persistent  atrial fibrillation (HCC)   Urothelial carcinoma of bladder (HCC)   Gross hematuria   Acute renal failure (ARF) (HCC)   Acute hypoxemic respiratory failure (HCC)   Delirium   Generalized weakness  Hypovolemic shock: likely secondary to gross hematuria secondary to bladder cancer. S/p cystoscopy & clot evacuation as per urology. S/p 3 units of pRBCs transfused. Continue to monitor H&H. Continue to hold eliquis. Shock has resolved    Acute blood loss anemia: s/p 3 units of pRBCs transfused. Secondary to above. Continue to monitor H&H   Acute hypoxic respiratory failure: resolved   Acute on chronic diastolic CHF exacerbation: s/p IV lasix given. Monitor I/Os and daily weights. Cardio following   PAF: w/ intermittent RVR. Continue on metoprolol, cardizem. Continue to hold eliquis. Continue on tele. Cardio following   AKI: likely secondary to obstruction from clot in bladder. Resolved   Generalized weakness: PT/OT recs SNF initially but now recs [facility]    BPH: continue on home dose of proscar, alfuzosin   Hypoalbuminemia: continue w/ nutritional support   Delirium: resolved   Intermittent orthostasis: likely secondary to deconditioning & taking metoprolol, cardizem. Continue w/ compression stocking   Discharge Instructions  Discharge Instructions     Diet - low sodium heart healthy   Complete by: As directed    Discharge instructions   Complete by: As directed    F/u w/ urology and oncology in 1 week. F/u w/ cardio, Dr. Ubaldo Glassing, in 1-2 weeks. F/u w/ PCP in 1-2 weeks   Increase activity slowly   Complete by: As directed    No wound care   Complete by: As directed       Allergies as of 01/20/2021       Reactions   Atenolol    Happened a long time ago and can't recall        Medication List     STOP taking these medications    apixaban 5 MG Tabs tablet Commonly known as: ELIQUIS   metoprolol succinate 25 MG 24 hr tablet Commonly known as: TOPROL-XL   metoprolol  succinate 50 MG 24 hr tablet Commonly known as: TOPROL-XL   traMADol 50 MG tablet Commonly known as: Ultram       TAKE these medications    alfuzosin 10 MG 24 hr tablet Commonly known as: UROXATRAL Take 1 tablet (10 mg total) by mouth daily with breakfast.   atorvastatin 10 MG tablet Commonly known as: LIPITOR Take 10 mg by mouth in the morning.   calcium carbonate 1500 (600 Ca) MG Tabs tablet Commonly known as: OSCAL Take 600 mg of elemental calcium by mouth in the morning.   diltiazem 240 MG 24 hr capsule Commonly known as: CARDIZEM CD Take 1 capsule (240 mg total) by mouth daily. Start taking on: January 21, 2021   ferrous sulfate 325 (65 FE) MG tablet Take 325 mg by mouth in the morning and at bedtime.   Fish Oil 1000 MG Caps Take 1,000 mg by mouth daily.   metoprolol tartrate 50 MG tablet Commonly known as: LOPRESSOR Take 1 tablet (50 mg total) by mouth 2 (two) times daily.   multivitamin with minerals Tabs tablet Take 1 tablet by mouth in the morning.   PRESERVISION AREDS 2  PO Take 1 tablet by mouth in the morning and at bedtime.   tamsulosin 0.4 MG Caps capsule Commonly known as: FLOMAX Take 0.4 mg by mouth daily.   Vitamin D3 50 MCG (2000 UT) Tabs Take 2,000 Units by mouth in the morning.       ASK your doctor about these medications    finasteride 5 MG tablet Commonly known as: PROSCAR TAKE 1 TABLET DAILY               Durable Medical Equipment  (From admission, onward)           Start     Ordered   01/19/21 1441  For home use only DME 3 n 1  Once        01/19/21 1441            Contact information for after-discharge care     Destination     [facility] .   Service: Skilled Nursing Contact information: [place] [address] [phone]                    Allergies  Allergen Reactions   Atenolol     Happened a long time ago and can't recall     Consultations: Cardio Urology    Procedures/Studies: DG Chest 2 View  Result Date: 01/12/2021 CLINICAL DATA:  Dyspnea EXAM: CHEST - 2 VIEW COMPARISON:  05/04/2020 FINDINGS: Left-sided pacing device with leads over the right atrium and right ventricle. Borderline cardiomegaly with aortic atherosclerosis. Coarse chronic interstitial disease. Patchy bilateral airspace opacities suspicious for superimposed pneumonia. No pneumothorax. Small pleural effusions on lateral view. IMPRESSION: 1. Patchy bilateral opacities suspicious for acute airspace disease/probable pneumonia superimposed on underlying chronic interstitial changes. 2. Borderline cardiac size.  Small pleural effusions. Electronically Signed   By: Donavan Foil M.D.   On: 01/12/2021 20:44   CT HEAD WO CONTRAST  Result Date: 01/08/2021 CLINICAL DATA:  Delirium. EXAM: CT HEAD WITHOUT CONTRAST TECHNIQUE: Contiguous axial images were obtained from the base of the skull through the vertex without intravenous contrast. COMPARISON:  February 07, 2019. FINDINGS: Brain: Mild chronic ischemic white matter disease is noted. No mass effect or midline shift is noted. Ventricular size is within normal limits. There is no evidence of mass lesion, hemorrhage or acute infarction. Vascular: No hyperdense vessel or unexpected calcification. Skull: Normal. Negative for fracture or focal lesion. Sinuses/Orbits: No acute finding. Other: None. IMPRESSION: No acute intracranial abnormality seen. Electronically Signed   By: Marijo Conception M.D.   On: 01/08/2021 14:31   DG Chest Port 1 View  Result Date: 01/16/2021 CLINICAL DATA:  Dyspnea. EXAM: PORTABLE CHEST 1 VIEW COMPARISON:  01/12/2021 FINDINGS: Decreased inspiration with no significant change in borderline enlargement of the cardiac silhouette. Stable left subclavian bipolar pacemaker leads and neural stimulator leads. Stable diffuse prominence of the interstitial markings with normal vascularity. No pleural  fluid. Unremarkable bones. IMPRESSION: 1. No acute abnormality. 2. Stable cardiomegaly and chronic interstitial lung disease. Electronically Signed   By: Claudie Revering M.D.   On: 01/16/2021 17:22   DG OR UROLOGY CYSTO IMAGE ([facility] ONLY)  Result Date: 01/05/2021 There is no interpretation for this exam.  This order is for images obtained during a surgical procedure.  Please See "Surgeries" Tab for more information regarding the procedure.   ECHOCARDIOGRAM COMPLETE  Result Date: 01/07/2021    ECHOCARDIOGRAM REPORT   Patient Name:   Christopher [name] Date of  Exam: 01/07/2021 Medical Rec #:  [id number]         Height:       71.0 in Accession #:    [id number]        Weight:       197.3 lb Date of Birth:  1928-02-09         BSA:          2.096 m Patient Age:    62 years          BP:           111/83 mmHg Patient Gender: M                 HR:           65 bpm. Exam Location:  [facility] Procedure: 2D Echo, Cardiac Doppler and Color Doppler Indications:     CHF-acute diastolic A56.97  History:         Patient has prior history of Echocardiogram examinations, most                  recent 03/01/2015. Pacemaker, Arrythmias:Atrial Fibrillation;                  Risk Factors:Dyslipidemia.  Sonographer:     Sherrie Sport RDCS (AE) Referring Phys:  [id number] Sharen Hones Diagnosing Phys: Bartholome Bill MD  Sonographer Comments: Technically challenging study due to limited acoustic windows, suboptimal apical window and suboptimal subcostal window. IMPRESSIONS  1. Left ventricular ejection fraction, by estimation, is 70 to 75%. The left ventricle has hyperdynamic function. The left ventricle has no regional wall motion abnormalities. There is mild left ventricular hypertrophy. Left ventricular diastolic parameters are consistent with Grade I diastolic dysfunction (impaired relaxation).  2. Right ventricular systolic function is normal. The right ventricular size is normal.  3. Right atrial size was mildly dilated.  4. The mitral valve is grossly  normal. Trivial mitral valve regurgitation.  5. The aortic valve is grossly normal. Aortic valve regurgitation is trivial. Mild to moderate aortic valve sclerosis/calcification is present, without any evidence of aortic stenosis. FINDINGS  Left Ventricle: Left ventricular ejection fraction, by estimation, is 70 to 75%. The left ventricle has hyperdynamic function. The left ventricle has no regional wall motion abnormalities. The left ventricular internal cavity size was normal in size. There is mild left ventricular hypertrophy. Left ventricular diastolic parameters are consistent with Grade I diastolic dysfunction (impaired relaxation). Right Ventricle: The right ventricular size is normal. No increase in right ventricular wall thickness. Right ventricular systolic function is normal. Left Atrium: Left atrial size was normal in size. Right Atrium: Right atrial size was mildly dilated. Pericardium: There is no evidence of pericardial effusion. Mitral Valve: The mitral valve is grossly normal. Trivial mitral valve regurgitation. Tricuspid Valve: The tricuspid valve is grossly normal. Tricuspid valve regurgitation is trivial. Aortic Valve: The aortic valve is grossly normal. Aortic valve regurgitation is trivial. Mild to moderate aortic valve sclerosis/calcification is present, without any evidence of aortic stenosis. Aortic valve mean gradient measures 11.7 mmHg. Aortic valve peak gradient measures 21.6 mmHg. Aortic valve area, by VTI measures 1.57 cm. Pulmonic Valve: The pulmonic valve was not well visualized. Pulmonic valve regurgitation is not visualized. Aorta: The aortic root is normal in size and structure. IAS/Shunts: The atrial septum is grossly normal.  LEFT VENTRICLE PLAX 2D LVIDd:         3.71 cm  Diastology LVIDs:         2.03 cm  LV  e' medial:    5.33 cm/s LV PW:         1.34 cm  LV E/e' medial:  21.6 LV IVS:        1.45 cm  LV e' lateral:   9.68 cm/s LVOT diam:     2.00 cm  LV E/e' lateral: 11.9 LV SV:          63 LV SV Index:   30 LVOT Area:     3.14 cm  RIGHT VENTRICLE RV S prime:     16.40 cm/s TAPSE (M-mode): 4.8 cm LEFT ATRIUM              Index       RIGHT ATRIUM           Index LA diam:        3.60 cm  1.72 cm/m  RA Area:     27.40 cm LA Vol (A2C):   74.0 ml  35.30 ml/m RA Volume:   85.00 ml  40.54 ml/m LA Vol (A4C):   119.0 ml 56.76 ml/m LA Biplane Vol: 103.0 ml 49.13 ml/m  AORTIC VALVE                    PULMONIC VALVE AV Area (Vmax):    1.66 cm     PV Vmax:        0.93 m/s AV Area (Vmean):   1.53 cm     PV Peak grad:   3.5 mmHg AV Area (VTI):     1.57 cm     RVOT Peak grad: 3 mmHg AV Vmax:           232.33 cm/s AV Vmean:          161.333 cm/s AV VTI:            0.397 m AV Peak Grad:      21.6 mmHg AV Mean Grad:      11.7 mmHg LVOT Vmax:         123.00 cm/s LVOT Vmean:        78.500 cm/s LVOT VTI:          0.199 m LVOT/AV VTI ratio: 0.50  AORTA Ao Root diam: 3.50 cm MITRAL VALVE                TRICUSPID VALVE MV Area (PHT): 3.54 cm     TR Peak grad:   18.8 mmHg MV Decel Time: 214 msec     TR Vmax:        217.00 cm/s MV E velocity: 115.00 cm/s MV A velocity: 68.60 cm/s   SHUNTS MV E/A ratio:  1.68         Systemic VTI:  0.20 m                             Systemic Diam: 2.00 cm Bartholome Bill MD Electronically signed by Bartholome Bill MD Signature Date/Time: 01/07/2021/3:19:25 PM    Final    (Echo, Carotid, EGD, Colonoscopy, ERCP)    Subjective: Pt c/o fatigue   Discharge Exam: Vitals:   01/20/21 1013 01/20/21 1156  BP:  (!) 118/59  Pulse: 93 66  Resp:  19  Temp:  97.8 F (36.6 C)  SpO2:  96%   Vitals:   01/20/21 0429 01/20/21 0729 01/20/21 1013 01/20/21 1156  BP: (!) 106/55 128/66  (!) 118/59  Pulse: 63 65 93 66  Resp: 20 18  19  Temp: 97.6 F (36.4 C) 97.6 F (36.4 C)  97.8 F (36.6 C)  TempSrc:      SpO2: 94% 94%  96%  Weight:      Height:        General: Pt is [name], awake, not in acute distress Cardiovascular: S1/S2 +, no rubs, no gallops Respiratory: CTA  bilaterally, no wheezing, no rhonchi Abdominal: Soft, NT, ND, bowel sounds + Extremities:  no cyanosis    The results of significant diagnostics from this hospitalization (including imaging, microbiology, ancillary and laboratory) are listed below for reference.     Microbiology: Recent Results (from the past 240 hour(s))  Resp Panel by RT-PCR (Flu A&B, Covid) Nasopharyngeal Swab     Status: None   Collection Time: 01/13/21  3:50 PM   Specimen: Nasopharyngeal Swab; Nasopharyngeal(NP) swabs in vial transport medium  Result Value Ref Range Status   SARS Coronavirus 2 by RT PCR NEGATIVE NEGATIVE Final    Comment: (NOTE) SARS-CoV-2 target nucleic acids are NOT DETECTED.  The SARS-CoV-2 RNA is generally detectable in upper respiratory specimens during the acute phase of infection. The lowest concentration of SARS-CoV-2 viral copies this assay can detect is 138 copies/mL. A negative result does not preclude SARS-Cov-2 infection and should not be used as the sole basis for treatment or other patient management decisions. A negative result may occur with  improper specimen collection/handling, submission of specimen other than nasopharyngeal swab, presence of viral mutation(s) within the areas targeted by this assay, and inadequate number of viral copies(<138 copies/mL). A negative result must be combined with clinical observations, patient history, and epidemiological information. The expected result is Negative.  Fact Sheet for Patients:  EntrepreneurPulse.com.au  Fact Sheet for Healthcare Providers:  IncredibleEmployment.be  This test is no t yet approved or cleared by the [place] FDA and  has been authorized for detection and/or diagnosis of SARS-CoV-2 by FDA under an Emergency Use Authorization (EUA). This EUA will remain  in effect (meaning this test can be used) for the duration of the COVID-19 declaration under Section 564(b)(1) of  the Act, 21 U.S.C.section 360bbb-3(b)(1), unless the authorization is terminated  or revoked sooner.       Influenza A by PCR NEGATIVE NEGATIVE Final   Influenza B by PCR NEGATIVE NEGATIVE Final    Comment: (NOTE) The Xpert Xpress SARS-CoV-2/FLU/RSV plus assay is intended as an aid in the diagnosis of influenza from Nasopharyngeal swab specimens and should not be used as a sole basis for treatment. Nasal washings and aspirates are unacceptable for Xpert Xpress SARS-CoV-2/FLU/RSV testing.  Fact Sheet for Patients: EntrepreneurPulse.com.au  Fact Sheet for Healthcare Providers: IncredibleEmployment.be  This test is not yet approved or cleared by the [place] FDA and has been authorized for detection and/or diagnosis of SARS-CoV-2 by FDA under an Emergency Use Authorization (EUA). This EUA will remain in effect (meaning this test can be used) for the duration of the COVID-19 declaration under Section 564(b)(1) of the Act, 21 U.S.C. section 360bbb-3(b)(1), unless the authorization is terminated or revoked.  Performed at [facility], [address] [place], [place] [address]      Labs: BNP (last 3 results) Recent Labs    01/13/21 0707  BNP 604.5*   Basic Metabolic Panel: Recent Labs  Lab 01/16/21 0458 01/17/21 0542 01/18/21 0730 01/19/21 0648 01/20/21 0621  NA 138 136 137 138 138  K 3.7 3.4* 3.1* 3.7 3.3*  CL 96* 95* 95* 95* 96*  CO2 36* 36*  35* 34* 33*  GLUCOSE 118* 123* 111* 110* 125*  BUN 33* 39* 40* 43* 46*  CREATININE 1.25* 1.28* 1.14 1.22 1.08  CALCIUM 8.6* 8.9 9.0 9.0 9.1  MG 2.3 2.4 2.3 2.1 2.3   Liver Function Tests: Recent Labs  Lab 01/14/21 0522  ALBUMIN 2.2*   No results for input(s): LIPASE, AMYLASE in the last 168 hours. No results for input(s): AMMONIA in the last 168 hours. CBC: Recent Labs  Lab 01/16/21 0458 01/17/21 0542 01/18/21 0730 01/19/21 0648 01/20/21 0621  WBC 8.6 7.8 7.3  7.6 6.6  HGB 11.2* 10.6* 10.4* 10.6* 10.6*  HCT 34.5* 32.8* 32.3* 33.4* 33.5*  MCV 93.8 96.2 94.7 97.7 95.7  PLT 310 333 345 363 387   Cardiac Enzymes: No results for input(s): CKTOTAL, CKMB, CKMBINDEX, TROPONINI in the last 168 hours. BNP: Invalid input(s): POCBNP CBG: No results for input(s): GLUCAP in the last 168 hours. D-Dimer No results for input(s): DDIMER in the last 72 hours. Hgb A1c No results for input(s): HGBA1C in the last 72 hours. Lipid Profile No results for input(s): CHOL, HDL, LDLCALC, TRIG, CHOLHDL, LDLDIRECT in the last 72 hours. Thyroid function studies No results for input(s): TSH, T4TOTAL, T3FREE, THYROIDAB in the last 72 hours.  Invalid input(s): FREET3 Anemia work up No results for input(s): VITAMINB12, FOLATE, FERRITIN, TIBC, IRON, RETICCTPCT in the last 72 hours. Urinalysis    Component Value Date/Time   COLORURINE RED (A) 01/03/2021 1415   APPEARANCEUR TURBID (A) 01/03/2021 1415   APPEARANCEUR Cloudy (A) 03/26/2020 0905   LABSPEC 1.025 01/03/2021 1415   PHURINE  01/03/2021 1415    TEST NOT REPORTED DUE TO COLOR INTERFERENCE OF URINE PIGMENT   GLUCOSEU (A) 01/03/2021 1415    TEST NOT REPORTED DUE TO COLOR INTERFERENCE OF URINE PIGMENT   HGBUR (A) 01/03/2021 1415    TEST NOT REPORTED DUE TO COLOR INTERFERENCE OF URINE PIGMENT   BILIRUBINUR (A) 01/03/2021 1415    TEST NOT REPORTED DUE TO COLOR INTERFERENCE OF URINE PIGMENT   BILIRUBINUR Negative 03/26/2020 0905   KETONESUR (A) 01/03/2021 1415    TEST NOT REPORTED DUE TO COLOR INTERFERENCE OF URINE PIGMENT   PROTEINUR (A) 01/03/2021 1415    TEST NOT REPORTED DUE TO COLOR INTERFERENCE OF URINE PIGMENT   NITRITE (A) 01/03/2021 1415    TEST NOT REPORTED DUE TO COLOR INTERFERENCE OF URINE PIGMENT   LEUKOCYTESUR (A) 01/03/2021 1415    TEST NOT REPORTED DUE TO COLOR INTERFERENCE OF URINE PIGMENT   Sepsis Labs Invalid input(s): PROCALCITONIN,  WBC,  LACTICIDVEN Microbiology Recent Results (from the  past 240 hour(s))  Resp Panel by RT-PCR (Flu A&B, Covid) Nasopharyngeal Swab     Status: None   Collection Time: 01/13/21  3:50 PM   Specimen: Nasopharyngeal Swab; Nasopharyngeal(NP) swabs in vial transport medium  Result Value Ref Range Status   SARS Coronavirus 2 by RT PCR NEGATIVE NEGATIVE Final    Comment: (NOTE) SARS-CoV-2 target nucleic acids are NOT DETECTED.  The SARS-CoV-2 RNA is generally detectable in upper respiratory specimens during the acute phase of infection. The lowest concentration of SARS-CoV-2 viral copies this assay can detect is 138 copies/mL. A negative result does not preclude SARS-Cov-2 infection and should not be used as the sole basis for treatment or other patient management decisions. A negative result may occur with  improper specimen collection/handling, submission of specimen other than nasopharyngeal swab, presence of viral mutation(s) within the areas targeted by this assay, and inadequate number of viral  copies(<138 copies/mL). A negative result must be combined with clinical observations, patient history, and epidemiological information. The expected result is Negative.  Fact Sheet for Patients:  EntrepreneurPulse.com.au  Fact Sheet for Healthcare Providers:  IncredibleEmployment.be  This test is no t yet approved or cleared by the [place] FDA and  has been authorized for detection and/or diagnosis of SARS-CoV-2 by FDA under an Emergency Use Authorization (EUA). This EUA will remain  in effect (meaning this test can be used) for the duration of the COVID-19 declaration under Section 564(b)(1) of the Act, 21 U.S.C.section 360bbb-3(b)(1), unless the authorization is terminated  or revoked sooner.       Influenza A by PCR NEGATIVE NEGATIVE Final   Influenza B by PCR NEGATIVE NEGATIVE Final    Comment: (NOTE) The Xpert Xpress SARS-CoV-2/FLU/RSV plus assay is intended as an aid in the diagnosis of  influenza from Nasopharyngeal swab specimens and should not be used as a sole basis for treatment. Nasal washings and aspirates are unacceptable for Xpert Xpress SARS-CoV-2/FLU/RSV testing.  Fact Sheet for Patients: EntrepreneurPulse.com.au  Fact Sheet for Healthcare Providers: IncredibleEmployment.be  This test is not yet approved or cleared by the [place] FDA and has been authorized for detection and/or diagnosis of SARS-CoV-2 by FDA under an Emergency Use Authorization (EUA). This EUA will remain in effect (meaning this test can be used) for the duration of the COVID-19 declaration under Section 564(b)(1) of the Act, 21 U.S.C. section 360bbb-3(b)(1), unless the authorization is terminated or revoked.  Performed at [facility], [address] [place], [place] [address]      Time coordinating discharge: Over 30 minutes  SIGNED:   Wyvonnia Dusky, MD  [facility] 01/20/2021, 12:42 PM Pager   If 7PM-7AM, please contact night-coverage

## 2021-01-20 NOTE — Care Management Important Message (Signed)
Important Message  Patient Details  Name: Christopher [name] MRN: [id number] Date of Birth: 1927-09-15   Medicare Important Message Given:  Yes     [name] 01/20/2021, 1:06 PM

## 2021-02-17 ENCOUNTER — Ambulatory Visit: Payer: Medicare HMO | Admitting: Dermatology

## 2021-03-16 ENCOUNTER — Other Ambulatory Visit: Payer: Self-pay

## 2021-03-16 ENCOUNTER — Ambulatory Visit: Payer: Medicare HMO | Admitting: Dermatology

## 2021-03-16 DIAGNOSIS — Z85828 Personal history of other malignant neoplasm of skin: Secondary | ICD-10-CM | POA: Diagnosis not present

## 2021-03-16 DIAGNOSIS — L820 Inflamed seborrheic keratosis: Secondary | ICD-10-CM

## 2021-03-16 DIAGNOSIS — C44622 Squamous cell carcinoma of skin of right upper limb, including shoulder: Secondary | ICD-10-CM

## 2021-03-16 DIAGNOSIS — L578 Other skin changes due to chronic exposure to nonionizing radiation: Secondary | ICD-10-CM

## 2021-03-16 DIAGNOSIS — L570 Actinic keratosis: Secondary | ICD-10-CM | POA: Diagnosis not present

## 2021-03-16 DIAGNOSIS — C44329 Squamous cell carcinoma of skin of other parts of face: Secondary | ICD-10-CM | POA: Diagnosis not present

## 2021-03-16 DIAGNOSIS — D492 Neoplasm of unspecified behavior of bone, soft tissue, and skin: Secondary | ICD-10-CM

## 2021-03-16 NOTE — Patient Instructions (Addendum)
If you have any questions or concerns for your doctor, please call our main line at [phone] and press option 4 to reach your doctor's medical assistant. If no one answers, please leave a voicemail as directed and we will return your call as soon as possible. Messages left after 4 pm will be answered the following business day.   You may also send us a message via MyChart. We typically respond to MyChart messages within 1-2 business days.  For prescription refills, please ask your pharmacy to contact our office. Our fax number is [phone].  If you have an urgent issue when the clinic is closed that cannot wait until the next business day, you can page your doctor at the number below.    Please note that while we do our best to be available for urgent issues outside of office hours, we are not available 24/7.   If you have an urgent issue and are unable to reach us, you may choose to seek medical care at your doctor's office, retail clinic, urgent care center, or emergency room.  If you have a medical emergency, please immediately call 911 or go to the emergency department.  Pager Numbers  - Dr. Kowalski: [phone]  - Dr. Moye: [phone]  - Dr. Stewart: [phone]  In the event of inclement weather, please call our main line at [phone] for an update on the status of any delays or closures.  Dermatology Medication Tips: Please keep the boxes that topical medications come in in order to help keep track of the instructions about where and how to use these. Pharmacies typically print the medication instructions only on the boxes and not directly on the medication tubes.   If your medication is too expensive, please contact our office at [phone] option 4 or send us a message through MyChart.   We are unable to tell what your co-pay for medications will be in advance as this is different depending on your insurance coverage. However, we may be able to find a substitute  medication at lower cost or fill out paperwork to get insurance to cover a needed medication.   If a prior authorization is required to get your medication covered by your insurance company, please allow us 1-2 business days to complete this process.  Drug prices often vary depending on where the prescription is filled and some pharmacies may offer cheaper prices.  The website www.goodrx.com contains coupons for medications through different pharmacies. The prices here do not account for what the cost may be with help from insurance (it may be cheaper with your insurance), but the website can give you the price if you did not use any insurance.  - You can print the associated coupon and take it with your prescription to the pharmacy.  - You may also stop by our office during regular business hours and pick up a GoodRx coupon card.  - If you need your prescription sent electronically to a different pharmacy, notify our office through [facility] MyChart or by phone at [phone] option 4.   Wound Care Instructions  Cleanse wound gently with soap and water once a day then pat dry with clean gauze. Apply a thing coat of Petrolatum (petroleum jelly, "Vaseline") over the wound (unless you have an allergy to this). We recommend that you use a new, sterile tube of Vaseline. Do not pick or remove scabs. Do not remove the yellow or white "healing tissue" from the base of the wound.  Cover the   wound with fresh, clean, nonstick gauze and secure with paper tape. You may use Band-Aids in place of gauze and tape if the would is small enough, but would recommend trimming much of the tape off as there is often too much. Sometimes Band-Aids can irritate the skin.  You should call the office for your biopsy report after 1 week if you have not already been contacted.  If you experience any problems, such as abnormal amounts of bleeding, swelling, significant bruising, significant pain, or evidence of infection,  please call the office immediately.  FOR ADULT SURGERY PATIENTS: If you need something for pain relief you may take 1 extra strength Tylenol (acetaminophen) AND 2 Ibuprofen (200mg each) together every 4 hours as needed for pain. (do not take these if you are allergic to them or if you have a reason you should not take them.) Typically, you may only need pain medication for 1 to 3 days.    

## 2021-03-16 NOTE — Progress Notes (Signed)
Follow-Up Visit   Subjective  Christopher [name] is a 87 y.o. male who presents for the following: Actinic Keratosis (Hands, face, scalp, 36mf/u), ISKs (Back, 617m/u), and check spots (R hand, bil elbows).  The following portions of the chart were reviewed this encounter and updated as appropriate:   Tobacco  Allergies  Meds  Problems  Med Hx  Surg Hx  Fam Hx     Review of Systems:  No other skin or systemic complaints except as noted in HPI or Assessment and Plan.  Objective  Well appearing patient in no apparent distress; mood and affect are within normal limits.  A focused examination was performed including face, scalp, hands, back. Relevant physical exam findings are noted in the Assessment and Plan.  bil hands x 16 (16) Pink scaly macules   L scapula x 1, L elbow x 1Total = 2 (2) Erythematous keratotic or waxy stuck-on papule or plaque.   Right Elbow Well healed scar with no evidence of recurrence, no lymphadenopathy.   R base of thumb 0.7cm hyperkeratotic pap  R preauricular 1.0cm pink plaque   Assessment & Plan   Actinic Damage - chronic, secondary to cumulative UV radiation exposure/sun exposure over time - diffuse scaly erythematous macules with underlying dyspigmentation - Recommend daily broad spectrum sunscreen SPF 30+ to sun-exposed areas, reapply every 2 hours as needed.  - Recommend staying in the shade or wearing long sleeves, sun glasses (UVA+UVB protection) and wide brim hats (4-inch brim around the entire circumference of the hat). - Call for new or changing lesions.  AK (actinic keratosis) (16) bil hands x 16  Destruction of lesion - bil hands x 16 Complexity: simple   Destruction method: cryotherapy   Informed consent: discussed and consent obtained   Timeout:  patient name, date of birth, surgical site, and procedure verified Lesion destroyed using liquid nitrogen: Yes   Region frozen until ice ball extended beyond lesion: Yes    Outcome: patient tolerated procedure well with no complications   Post-procedure details: wound care instructions given    Inflamed seborrheic keratosis L scapula x 1, L elbow x 1Total = 2  Destruction of lesion - L scapula x 1, L elbow x 1Total = 2 Complexity: simple   Destruction method: cryotherapy   Informed consent: discussed and consent obtained   Timeout:  patient name, date of birth, surgical site, and procedure verified Lesion destroyed using liquid nitrogen: Yes   Region frozen until ice ball extended beyond lesion: Yes   Outcome: patient tolerated procedure well with no complications   Post-procedure details: wound care instructions given    History of SCC (squamous cell carcinoma) of skin Right Elbow  Clear. Observe for recurrence. Call clinic for new or changing lesions.  Recommend regular skin exams, daily broad-spectrum spf 30+ sunscreen use, and photoprotection.    Neoplasm of skin (2) R base of thumb  Epidermal / dermal shaving  Lesion diameter (cm):  0.7 Informed consent: discussed and consent obtained   Timeout: patient name, date of birth, surgical site, and procedure verified   Procedure prep:  Patient was prepped and draped in usual sterile fashion Prep type:  Isopropyl alcohol Anesthesia: the lesion was anesthetized in a standard fashion   Anesthetic:  1% lidocaine w/ epinephrine 1-100,000 buffered w/ 8.4% NaHCO3 Instrument used: flexible razor blade   Hemostasis achieved with: pressure, aluminum chloride and electrodesiccation   Outcome: patient tolerated procedure well   Post-procedure details: sterile dressing applied and  wound care instructions given   Dressing type: bandage and petrolatum    Destruction of lesion Complexity: extensive   Destruction method: electrodesiccation and curettage   Informed consent: discussed and consent obtained   Timeout:  patient name, date of birth, surgical site, and procedure verified Procedure prep:  Patient  was prepped and draped in usual sterile fashion Prep type:  Isopropyl alcohol Anesthesia: the lesion was anesthetized in a standard fashion   Anesthetic:  1% lidocaine w/ epinephrine 1-100,000 buffered w/ 8.4% NaHCO3 Curettage performed in three different directions: Yes   Electrodesiccation performed over the curetted area: Yes   Lesion length (cm):  0.7 Lesion width (cm):  0.7 Margin per side (cm):  0.2 Final wound size (cm):  1.1 Hemostasis achieved with:  pressure, aluminum chloride and electrodesiccation Outcome: patient tolerated procedure well with no complications   Post-procedure details: sterile dressing applied and wound care instructions given   Dressing type: bandage and petrolatum    Specimen 1 - Surgical pathology Differential Diagnosis: D48.5 R/O SCC  Check Margins: No 0.7cm hyperkeratotic pap EDC  R preauricular  Skin / nail biopsy Type of biopsy: tangential   Informed consent: discussed and consent obtained   Timeout: patient name, date of birth, surgical site, and procedure verified   Procedure prep:  Patient was prepped and draped in usual sterile fashion Prep type:  Isopropyl alcohol Anesthesia: the lesion was anesthetized in a standard fashion   Anesthetic:  1% lidocaine w/ epinephrine 1-100,000 buffered w/ 8.4% NaHCO3 Instrument used: flexible razor blade   Outcome: patient tolerated procedure well   Post-procedure details: sterile dressing applied and wound care instructions given   Dressing type: bandage and bacitracin    Specimen 2 - Surgical pathology Differential Diagnosis: D48.5 R/O BCC  Check Margins: No 1.0cm pink plaque  Return in about 2 months (around 05/16/2021) for AK f/u.  I, Othelia Pulling, RMA, am acting as scribe for Sarina Ser, MD . Documentation: I have reviewed the above documentation for accuracy and completeness, and I agree with the above.  Sarina Ser, MD

## 2021-03-17 ENCOUNTER — Encounter: Payer: Self-pay | Admitting: Dermatology

## 2021-03-23 ENCOUNTER — Telehealth: Payer: Self-pay

## 2021-03-23 NOTE — Telephone Encounter (Signed)
-----   Message from Ralene Bathe, MD sent at 03/18/2021  7:00 PM EDT ----- Diagnosis 1. Skin , right base of thumb WELL DIFFERENTIATED SQUAMOUS CELL CARCINOMA, CLOSE TO MARGIN 2. Skin , right preauricular POORLY DIFFERENTIATED SQUAMOUS CELL CARCINOMA, BASE INVOLVED  1- Cancer - SCC Already treated Recheck next visit 2- Cancer - SCC Schedule surgery

## 2021-03-23 NOTE — Telephone Encounter (Signed)
Advised patient's daughter of results. He was recently diagnosed with bladder cancer and is in the process of scheduling his treatment dates for that. She will have him call back when those dates are scheduled to schedule an appointment for surgery here/hd

## 2021-03-30 ENCOUNTER — Inpatient Hospital Stay: Payer: Medicare HMO | Attending: Internal Medicine

## 2021-03-30 ENCOUNTER — Inpatient Hospital Stay: Payer: Medicare HMO

## 2021-03-30 ENCOUNTER — Other Ambulatory Visit: Payer: Self-pay

## 2021-03-30 ENCOUNTER — Inpatient Hospital Stay: Payer: Medicare HMO | Admitting: Internal Medicine

## 2021-03-30 ENCOUNTER — Encounter: Payer: Self-pay | Admitting: Internal Medicine

## 2021-03-30 DIAGNOSIS — Z801 Family history of malignant neoplasm of trachea, bronchus and lung: Secondary | ICD-10-CM | POA: Diagnosis not present

## 2021-03-30 DIAGNOSIS — D500 Iron deficiency anemia secondary to blood loss (chronic): Secondary | ICD-10-CM | POA: Insufficient documentation

## 2021-03-30 DIAGNOSIS — I4891 Unspecified atrial fibrillation: Secondary | ICD-10-CM | POA: Diagnosis not present

## 2021-03-30 DIAGNOSIS — C679 Malignant neoplasm of bladder, unspecified: Secondary | ICD-10-CM | POA: Insufficient documentation

## 2021-03-30 DIAGNOSIS — I10 Essential (primary) hypertension: Secondary | ICD-10-CM | POA: Insufficient documentation

## 2021-03-30 LAB — CBC WITH DIFFERENTIAL/PLATELET
Abs Immature Granulocytes: 0.01 10*3/uL (ref 0.00–0.07)
Basophils Absolute: 0 10*3/uL (ref 0.0–0.1)
Basophils Relative: 1 %
Eosinophils Absolute: 0.2 10*3/uL (ref 0.0–0.5)
Eosinophils Relative: 2 %
HCT: 37.9 % — ABNORMAL LOW (ref 39.0–52.0)
Hemoglobin: 12.4 g/dL — ABNORMAL LOW (ref 13.0–17.0)
Immature Granulocytes: 0 %
Lymphocytes Relative: 14 %
Lymphs Abs: 0.9 10*3/uL (ref 0.7–4.0)
MCH: 30.4 pg (ref 26.0–34.0)
MCHC: 32.7 g/dL (ref 30.0–36.0)
MCV: 92.9 fL (ref 80.0–100.0)
Monocytes Absolute: 1 10*3/uL (ref 0.1–1.0)
Monocytes Relative: 17 %
Neutro Abs: 4.1 10*3/uL (ref 1.7–7.7)
Neutrophils Relative %: 66 %
Platelets: 225 10*3/uL (ref 150–400)
RBC: 4.08 MIL/uL — ABNORMAL LOW (ref 4.22–5.81)
RDW: 14.7 % (ref 11.5–15.5)
WBC: 6.1 10*3/uL (ref 4.0–10.5)
nRBC: 0 % (ref 0.0–0.2)

## 2021-03-30 LAB — BASIC METABOLIC PANEL
Anion gap: 7 (ref 5–15)
BUN: 20 mg/dL (ref 8–23)
CO2: 27 mmol/L (ref 22–32)
Calcium: 9.1 mg/dL (ref 8.9–10.3)
Chloride: 104 mmol/L (ref 98–111)
Creatinine, Ser: 1.08 mg/dL (ref 0.61–1.24)
GFR, Estimated: >60 mL/min (ref >60)
Glucose, Bld: 122 mg/dL — ABNORMAL HIGH (ref 70–99)
Potassium: 4.2 mmol/L (ref 3.5–5.1)
Sodium: 138 mmol/L (ref 135–145)

## 2021-03-30 MED ORDER — SODIUM CHLORIDE 0.9 % IV SOLN
Freq: Once | INTRAVENOUS | Status: AC
  Filled 2021-03-30: qty 250

## 2021-03-30 MED ORDER — IRON SUCROSE 20 MG/ML IV SOLN
200.0000 mg | Freq: Once | INTRAVENOUS | Status: AC
  Administered 2021-03-30: 200 mg via INTRAVENOUS
  Filled 2021-03-30: qty 10

## 2021-03-30 MED ORDER — SODIUM CHLORIDE 0.9 % IV SOLN: 200.0000 mg | Freq: Once | INTRAVENOUS | Status: DC

## 2021-03-30 NOTE — Patient Instructions (Signed)

## 2021-03-30 NOTE — Progress Notes (Signed)
[facility] NOTE  Patient Care Team: [name], MD as PCP - General (Infectious Diseases)  CHIEF COMPLAINTS/PURPOSE OF CONSULTATION: Peritoneal thickening/IDA  Oncology History Overview Note  # NOV 2020- [incidental/hematuria work-up]-subtle peritoneal thickening/plaque-like lesions; March 2021-CT scan progressive omental/peritoneal thickening  #Microscopic hematuria- sec to BPH [cystoscopy/CT urogram negative; Dr.Stoiff]; March 2021 CT scan-new lesion in the bladder diverticulum  # MAY-June 2021- severe IDA sec to hemauria [hb 8.5; Ferritin- ]  # on eliquis ? A.fib/pacemaker;   Urothelial carcinoma of bladder ([place])  10/29/2019 Initial Diagnosis   Urothelial carcinoma of bladder ([place])      HISTORY OF PRESENTING ILLNESS: pt ambulating independently.  Alone.  Christopher [name] 87 y.o.  male with a history of peritoneal/omental thickening of unclear etiology; also recurrent bladder malignancy/hematuria  Patient was admitted to hospital in July 2022- because of hemorraghic shock s/p PRBC transfusion 3 units.  Patient had hemorrhage from his hematuria.  Bladder tumor.  Patient is currently awaiting to start radiation for possible muscle invasive bladder cancer; no chemotherapy given marginal benefit/risk of side effects.  Awaiting catheter placement.  Review of Systems  Constitutional:  Negative for chills, diaphoresis, fever and weight loss.  HENT:  Negative for nosebleeds and sore throat.   Eyes:  Negative for double vision.  Respiratory:  Negative for cough, hemoptysis, sputum production and wheezing.   Cardiovascular:  Negative for chest pain, palpitations, orthopnea and leg swelling.  Gastrointestinal:  Positive for constipation. Negative for abdominal pain, blood in stool, diarrhea, heartburn, melena, nausea and vomiting.  Genitourinary:  Positive for hematuria. Negative for dysuria, frequency and urgency.  Musculoskeletal:  Negative for back  pain and joint pain.  Skin: Negative.  Negative for itching and rash.  Neurological:  Negative for dizziness, tingling, focal weakness, weakness and headaches.  Endo/Heme/Allergies:  Does not bruise/bleed easily.  Psychiatric/Behavioral:  Negative for depression. The patient is not nervous/anxious and does not have insomnia.     MEDICAL HISTORY:  Past Medical History:  Diagnosis Date   A-fib ([place])    Anemia    Arthritis    BACK   Bladder cancer ([facility])    BPH (benign prostatic hyperplasia)    Cancer ([facility])    skin cancer on face carcinoma   ED (erectile dysfunction)    Rosanna Randy disease    HLD (hyperlipidemia)    Hypertension    Macular degeneration    Mollusca contagiosa    Osteopenia    Presence of permanent cardiac pacemaker    Sensorineural hearing loss    Squamous cell carcinoma of skin 08/25/2019   R cheek    Squamous cell carcinoma of skin 02/12/2020   L neck    Squamous cell carcinoma of skin 11/30/2020   Right elbow, EDC   Squamous cell carcinoma of skin 03/16/2021   right base of thumb EDC   Squamous cell carcinoma of skin 03/16/2021   right preauricular - needs surgery    SURGICAL HISTORY: Past Surgical History:  Procedure Laterality Date   CATARACT EXTRACTION Bilateral    CYSTOSCOPY WITH BIOPSY N/A 01/20/2020   Procedure: CYSTOSCOPY WITH BIOPSY;  Surgeon: Abbie Sons, MD;  Location: [facility];  Service: Urology;  Laterality: N/A;   CYSTOSCOPY WITH FULGERATION N/A 01/20/2020   Procedure: CYSTOSCOPY WITH FULGERATION;  Surgeon: Abbie Sons, MD;  Location: [facility];  Service: Urology;  Laterality: N/A;   CYSTOSCOPY WITH FULGERATION N/A 04/19/2020   Procedure: [place] with clot evacuation;  Surgeon: Bernardo Heater,  Ronda Fairly, MD;  Location: [facility];  Service: Urology;  Laterality: N/A;   CYSTOSCOPY WITH FULGERATION N/A 01/05/2021   Procedure: CYSTOSCOPY WITH FULGERATION;  Surgeon: Abbie Sons, MD;  Location: [facility];  Service: Urology;   Laterality: N/A;   HEMORRHOIDECTOMY WITH HEMORRHOID BANDING     KNEE ARTHROSCOPY Left    PACEMAKER INSERTION N/A 03/03/2015   Procedure: INSERTION DUAL LEAD PACEMAKER;  Surgeon: Isaias Cowman, MD;  Location: [facility];  Service: Cardiovascular;  Laterality: N/A;   PULSE GENERATOR IMPLANT N/A 10/18/2020   Procedure: UNILATERAL PULSE GENERATOR IMPLANT;  Surgeon: [name], MD;  Location: [facility];  Service: Neurosurgery;  Laterality: N/A;   SPINAL CORD STIMULATOR TRIAL N/A 10/11/2020   Procedure: PERCUTANEOUS SPINAL CORD STIMULATOR TRIAL;  Surgeon: [name], MD;  Location: [facility];  Service: Neurosurgery;  Laterality: N/A;   THORACIC LAMINECTOMY FOR SPINAL CORD STIMULATOR N/A 10/18/2020   Procedure: PERCUTANEOUS LEAD PLACEMENT;  Surgeon: [name], MD;  Location: [facility];  Service: Neurosurgery;  Laterality: N/A;   TONSILLECTOMY     TRANSURETHRAL RESECTION OF BLADDER TUMOR N/A 10/07/2019   Procedure: TRANSURETHRAL RESECTION OF BLADDER TUMOR (TURBT);  Surgeon: Abbie Sons, MD;  Location: [facility];  Service: Urology;  Laterality: N/A;   TRANSURETHRAL RESECTION OF BLADDER TUMOR N/A 04/13/2020   Procedure: TRANSURETHRAL RESECTION OF BLADDER TUMOR (TURBT);  Surgeon: Abbie Sons, MD;  Location: [facility];  Service: Urology;  Laterality: N/A;    SOCIAL HISTORY: Social History   Socioeconomic History   Marital status: Widowed    Spouse name: Not on file   Number of children: Not on file   Years of education: Not on file   Highest education level: Not on file  Occupational History   Not on file  Tobacco Use   Smoking status: Never   Smokeless tobacco: Never  Vaping Use   Vaping Use: Never used  Substance and Sexual Activity   Alcohol use: Yes    Alcohol/week: 7.0 standard drinks    Types: 7 Glasses of wine per week    Comment: DAILY    Drug use: No   Sexual activity: Yes    Birth control/protection: None  Other Topics Concern   Not on file  Social History Narrative    Retd. Civil engineer, contracting; [place]; self. Never smoked; alcohol- glass wine/drink every night.     Social Determinants of Health   Financial Resource Strain: Not on file  Food Insecurity: Not on file  Transportation Needs: Not on file  Physical Activity: Not on file  Stress: Not on file  Social Connections: Not on file  Intimate Partner Violence: Not on file    FAMILY HISTORY: Family History  Problem Relation Age of Onset   Diabetes Mother    Lung cancer Father    Cancer Paternal Uncle    Prostate cancer Neg Hx    Bladder Cancer Neg Hx    Kidney cancer Neg Hx     ALLERGIES:  is allergic to atenolol.  MEDICATIONS:  Current Outpatient Medications  Medication Sig Dispense Refill   alfuzosin (UROXATRAL) 10 MG 24 hr tablet Take 1 tablet (10 mg total) by mouth daily with breakfast. 90 tablet 3   atorvastatin (LIPITOR) 10 MG tablet Take 10 mg by mouth in the morning.     calcium carbonate (OSCAL) 1500 (600 Ca) MG TABS tablet Take 600 mg of elemental calcium by mouth in the morning.     Cholecalciferol (VITAMIN D3) 50 MCG (2000 UT)  TABS Take 2,000 Units by mouth in the morning.     diltiazem (CARDIZEM CD) 240 MG 24 hr capsule Take 1 capsule (240 mg total) by mouth daily. 30 capsule 0   ferrous sulfate 325 (65 FE) MG tablet Take 325 mg by mouth in the morning and at bedtime.     finasteride (PROSCAR) 5 MG tablet TAKE 1 TABLET DAILY (Patient taking differently: Take 5 mg by mouth daily.) 90 tablet 3   metoprolol tartrate (LOPRESSOR) 50 MG tablet Take 1 tablet (50 mg total) by mouth 2 (two) times daily. 60 tablet 0   Multiple Vitamin (MULTIVITAMIN WITH MINERALS) TABS tablet Take 1 tablet by mouth in the morning.     Multiple Vitamins-Minerals (PRESERVISION AREDS 2 PO) Take 1 tablet by mouth in the morning and at bedtime.     Omega-3 Fatty Acids (FISH OIL) 1000 MG CAPS Take 1,000 mg by mouth daily.     tamsulosin (FLOMAX) 0.4 MG CAPS capsule Take 0.4 mg by mouth daily.     No current  facility-administered medications for this visit.   Facility-Administered Medications Ordered in Other Visits  Medication Dose Route Frequency Provider Last Rate Last Admin   0.9 %  sodium chloride infusion   Intravenous Once Charlaine Dalton R, MD       iron sucrose (VENOFER) injection 200 mg  200 mg Intravenous Once Cammie Sickle, MD          .  PHYSICAL EXAMINATION: ECOG PERFORMANCE STATUS: 0 - Asymptomatic  Vitals:   03/30/21 1320  BP: 111/60  Pulse: 72  Resp: 16  Temp: 98.7 F (37.1 C)  SpO2: 97%    Filed Weights   03/30/21 1320  Weight: 190 lb (86.2 kg)     Physical Exam HENT:     Head: Normocephalic and atraumatic.     Mouth/Throat:     Pharynx: No oropharyngeal exudate.  Eyes:     Pupils: Pupils are equal, round, and reactive to light.  Cardiovascular:     Rate and Rhythm: Normal rate and regular rhythm.  Pulmonary:     Effort: No respiratory distress.     Breath sounds: No wheezing.  Abdominal:     General: Bowel sounds are normal. There is no distension.     Palpations: Abdomen is soft. There is no mass.     Tenderness: There is no abdominal tenderness. There is no guarding or rebound.  Musculoskeletal:        General: No tenderness. Normal range of motion.     Cervical back: Normal range of motion and neck supple.  Skin:    General: Skin is warm.  Neurological:     Mental Status: He is alert and oriented to person, place, and time.  Psychiatric:        Mood and Affect: Affect normal.      LABORATORY DATA:  I have reviewed the data as listed Lab Results  Component Value Date   WBC 6.1 03/30/2021   HGB 12.4 (L) 03/30/2021   HCT 37.9 (L) 03/30/2021   MCV 92.9 03/30/2021   PLT 225 03/30/2021   Recent Labs    01/14/21 0522 01/15/21 0559 01/19/21 0648 01/20/21 0621 03/30/21 1251  NA 138   < > 138 138 138  K 3.5   < > 3.7 3.3* 4.2  CL 99   < > 95* 96* 104  CO2 32   < > 34* 33* 27  GLUCOSE 109*   < > 110* 125*  122*  BUN  24*   < > 43* 46* 20  CREATININE 1.06   < > 1.22 1.08 1.08  CALCIUM 8.3*   < > 9.0 9.1 9.1  GFRNONAA >60   < > 56* >60 >60  ALBUMIN 2.2*  --   --   --   --    < > = values in this interval not displayed.    RADIOGRAPHIC STUDIES: I have personally reviewed the radiological images as listed and agreed with the findings in the report. No results found.  ASSESSMENT & PLAN:   Iron deficiency anemia due to chronic blood loss #Iron deficiency anemia secondary to chronic blood loss/hematuria [see below]- S/p  IV Venofer; Today Hb-12.3. proceed with venofer today.    # JUNE 2022- CT [facility]- Multiple recurrent high-grade-suspicious for muscle invasive urothelial malignancy; possible extravascular extension of the tumor [biopsy-[facility] negative for muscle invasion].  Patient awaiting to start radiation at [facility] tomorrow.  We will get a Foley catheter.    At tumor board, we discussed the fact that there is a peritoneal nodule that may or may not be significant and we discussed the feasibility of biopsy. Another alternative is PET scan and this came back negative. Pt had attempted TURBT, not aggressively resected.  # A.fib- off eliquis [Dr.Fath]-which is very reasonable given patient's recent hemorrhagic shock/July 2022.  # Omental/peritoneal thickening-unclear etiology; follow-up CT June 2022 [[facility]]-negative for any progression.  Monitor for now.  # DISPOSITION: # proceed with venofer infusion today # follow up in 4 months  MD; labs; cbc/bmp; iron studies/ ferritin-;Possible Venofer-Dr.B   All questions were answered. The patient knows to call the clinic with any problems, questions or concerns.    Cammie Sickle, MD 03/30/2021 1:52 PM

## 2021-03-30 NOTE — Progress Notes (Signed)
Pt is having incontinence issues related to bladder cancer and is having catheter implanted tomorrow. Pt states he is to start radiation treatment soon.  Pt reports he has recently moved in with his daughter in [place].  Pharmacy has changed to [facility] in [place]. Medication record updated.

## 2021-03-30 NOTE — Assessment & Plan Note (Signed)
#  Iron deficiency anemia secondary to chronic blood loss/hematuria [see below]- S/p  IV Venofer; Today Hb-12.3. proceed with venofer today.    # JUNE 2022- CT [facility]- Multiple recurrent high-grade-suspicious for muscle invasive urothelial malignancy; possible extravascular extension of the tumor [biopsy-[facility] negative for muscle invasion].  Patient awaiting to start radiation at [facility] tomorrow.  We will get a Foley catheter.    At tumor board, we discussed the fact that there is a peritoneal nodule that may or may not be significant and we discussed the feasibility of biopsy. Another alternative is PET scan and this came back negative. Pt had attempted TURBT, not aggressively resected.  # A.fib- off eliquis [Dr.Fath]-which is very reasonable given patient's recent hemorrhagic shock/July 2022.  # Omental/peritoneal thickening-unclear etiology; follow-up CT June 2022 [[facility]]-negative for any progression.  Monitor for now.  # DISPOSITION: # proceed with venofer infusion today # follow up in 4 months  MD; labs; cbc/bmp; iron studies/ ferritin-;Possible Venofer-Dr.B

## 2021-05-18 ENCOUNTER — Ambulatory Visit: Payer: Medicare HMO | Admitting: Dermatology

## 2021-06-22 ENCOUNTER — Other Ambulatory Visit: Payer: Self-pay

## 2021-06-22 ENCOUNTER — Ambulatory Visit (INDEPENDENT_AMBULATORY_CARE_PROVIDER_SITE_OTHER): Payer: Medicare HMO | Admitting: Dermatology

## 2021-06-22 DIAGNOSIS — L578 Other skin changes due to chronic exposure to nonionizing radiation: Secondary | ICD-10-CM

## 2021-06-22 DIAGNOSIS — L570 Actinic keratosis: Secondary | ICD-10-CM | POA: Diagnosis not present

## 2021-06-22 DIAGNOSIS — L820 Inflamed seborrheic keratosis: Secondary | ICD-10-CM | POA: Diagnosis not present

## 2021-06-22 MED ORDER — FLUOROURACIL 5 % EX CREA
TOPICAL_CREAM | Freq: Two times a day (BID) | CUTANEOUS | 1 refills | Status: DC
Start: 2021-06-22 — End: 2021-10-05

## 2021-06-22 NOTE — Progress Notes (Signed)
Follow-Up Visit   Subjective  Christopher [name] is a 87 y.o. male who presents for the following: Actinic Keratosis (Bil hands, 54m f/u). The patient has spots, moles and lesions to be evaluated, some may be new or changing and the patient has concerns that these could be cancer.   The following portions of the chart were reviewed this encounter and updated as appropriate:   Tobacco   Allergies   Meds   Problems   Med Hx   Surg Hx   Fam Hx      Review of Systems:  No other skin or systemic complaints except as noted in HPI or Assessment and Plan.  Objective  Well appearing patient in no apparent distress; mood and affect are within normal limits.  A focused examination was performed including face, arms, hands, scalp, ears. Relevant physical exam findings are noted in the Assessment and Plan.  face, scalp, ears, hands x 17 (17) Pink scaly macules   bil arms x 8, Total = 8 Erythematous keratotic or waxy stuck-on papule or plaque.    Assessment & Plan   Actinic Damage - Severe, confluent actinic changes with pre-cancerous actinic keratoses  - Severe, chronic, not at goal, secondary to cumulative UV radiation exposure over time - diffuse scaly erythematous macules and papules with underlying dyspigmentation - Discussed Prescription "Field Treatment" for Severe, Chronic Confluent Actinic Changes with Pre-Cancerous Actinic Keratoses Field treatment involves treatment of an entire area of skin that has confluent Actinic Changes (Sun/ Ultraviolet light damage) and PreCancerous Actinic Keratoses by method of PhotoDynamic Therapy (PDT) and/or prescription Topical Chemotherapy agents such as 5-fluorouracil, 5-fluorouracil/calcipotriene, and/or imiquimod.  The purpose is to decrease the number of clinically evident and subclinical PreCancerous lesions to prevent progression to development of skin cancer by chemically destroying early precancer changes that may or may not be visible.  It has been  shown to reduce the risk of developing skin cancer in the treated area. As a result of treatment, redness, scaling, crusting, and open sores may occur during treatment course. One or more than one of these methods may be used and may have to be used several times to control, suppress and eliminate the PreCancerous changes. Discussed treatment course, expected reaction, and possible side effects. - Recommend daily broad spectrum sunscreen SPF 30+ to sun-exposed areas, reapply every 2 hours as needed.  - Staying in the shade or wearing long sleeves, sun glasses (UVA+UVB protection) and wide brim hats (4-inch brim around the entire circumference of the hat) are also recommended. - Call for new or changing lesions.  Start 5-fluorouracil/calcipotriene cream twice a day for 7 days to affected areas including backs of hands. Prescription sent to [facility]. Patient provided with contact information for pharmacy and advised the pharmacy will mail the prescription to their home. Patient provided with handout reviewing treatment course and side effects and advised to call or message [place] on MyChart with any concerns.   AK (actinic keratosis) (17) face, scalp, ears, hands x 17  Destruction of lesion - face, scalp, ears, hands x 17 Complexity: simple   Destruction method: cryotherapy   Informed consent: discussed and consent obtained   Timeout:  patient name, date of birth, surgical site, and procedure verified Lesion destroyed using liquid nitrogen: Yes   Region frozen until ice ball extended beyond lesion: Yes   Outcome: patient tolerated procedure well with no complications   Post-procedure details: wound care instructions given    fluorouracil (EFUDEX) 5 % cream -  face, scalp, ears, hands x 17 Apply topically 2 (two) times daily. Bid for 7 days to backs of hands, thin coat  Inflamed seborrheic keratosis bil arms x 8, Total = 8  Destruction of lesion - bil arms x 8, Total = 8 Complexity: simple    Destruction method: cryotherapy   Informed consent: discussed and consent obtained   Timeout:  patient name, date of birth, surgical site, and procedure verified Lesion destroyed using liquid nitrogen: Yes   Region frozen until ice ball extended beyond lesion: Yes   Outcome: patient tolerated procedure well with no complications   Post-procedure details: wound care instructions given     Return in about 10 weeks (around 08/31/2021) for AK f/u.  I, Othelia Pulling, RMA, am acting as scribe for Sarina Ser, MD . Documentation: I have reviewed the above documentation for accuracy and completeness, and I agree with the above.  Sarina Ser, MD

## 2021-06-22 NOTE — Patient Instructions (Addendum)
If You Need Anything After Your Visit ° °If you have any questions or concerns for your doctor, please call our main line at [phone] and press option 4 to reach your doctor's medical assistant. If no one answers, please leave a voicemail as directed and we will return your call as soon as possible. Messages left after 4 pm will be answered the following business day.  ° °You may also send us a message via MyChart. We typically respond to MyChart messages within 1-2 business days. ° °For prescription refills, please ask your pharmacy to contact our office. Our fax number is [phone]. ° °If you have an urgent issue when the clinic is closed that cannot wait until the next business day, you can page your doctor at the number below.   ° °Please note that while we do our best to be available for urgent issues outside of office hours, we are not available 24/7.  ° °If you have an urgent issue and are unable to reach us, you may choose to seek medical care at your doctor's office, retail clinic, urgent care center, or emergency room. ° °If you have a medical emergency, please immediately call 911 or go to the emergency department. ° °Pager Numbers ° °- Dr. Kowalski: [phone] ° °- Dr. Moye: [phone] ° °- Dr. Stewart: [phone] ° °In the event of inclement weather, please call our main line at [phone] for an update on the status of any delays or closures. ° °Dermatology Medication Tips: °Please keep the boxes that topical medications come in in order to help keep track of the instructions about where and how to use these. Pharmacies typically print the medication instructions only on the boxes and not directly on the medication tubes.  ° °If your medication is too expensive, please contact our office at [phone] option 4 or send us a message through MyChart.  ° °We are unable to tell what your co-pay for medications will be in advance as this is different depending on your insurance coverage.  However, we may be able to find a substitute medication at lower cost or fill out paperwork to get insurance to cover a needed medication.  ° °If a prior authorization is required to get your medication covered by your insurance company, please allow us 1-2 business days to complete this process. ° °Drug prices often vary depending on where the prescription is filled and some pharmacies may offer cheaper prices. ° °The website www.goodrx.com contains coupons for medications through different pharmacies. The prices here do not account for what the cost may be with help from insurance (it may be cheaper with your insurance), but the website can give you the price if you did not use any insurance.  °- You can print the associated coupon and take it with your prescription to the pharmacy.  °- You may also stop by our office during regular business hours and pick up a GoodRx coupon card.  °- If you need your prescription sent electronically to a different pharmacy, notify our office through [facility] MyChart or by phone at [phone] option 4. ° ° ° ° °Si Usted Necesita Algo Después de Su Visita ° °También puede enviarnos un mensaje a través de MyChart. Por lo general respondemos a los mensajes de MyChart en el transcurso de 1 a 2 días hábiles. ° °Para renovar recetas, por favor pida a su farmacia que se ponga en contacto con nuestra oficina. Nuestro número de fax es el [phone]. ° °Si tiene   un asunto urgente cuando la clnica est cerrada y que no puede esperar hasta el siguiente da hbil, puede llamar/localizar a su doctor(a) al nmero que aparece a continuacin.   Por favor, tenga en cuenta que aunque hacemos todo lo posible para estar disponibles para asuntos urgentes fuera del horario de [place], no estamos disponibles las 24 horas del da, los 7 das de la [place].   Si tiene un problema urgente y no puede comunicarse con nosotros, puede optar por buscar atencin mdica  en el consultorio de su  doctor(a), en una clnica privada, en un centro de atencin urgente o en una sala de emergencias.  Si tiene Engineering geologist, por favor llame inmediatamente al 911 o vaya a la sala de emergencias.  Nmeros de bper  - Dr. Nehemiah Massed: [phone]  - Dra. Moye: [phone]  - Dra. Nicole Kindred: [phone]  En caso de inclemencias del [place], por favor llame a Johnsie Kindred principal al [phone] para una actualizacin sobre el [place] de cualquier retraso o cierre.  Consejos para la medicacin en dermatologa: Por favor, guarde las cajas en las que vienen los medicamentos de uso tpico para ayudarle a seguir las instrucciones sobre dnde y cmo usarlos. Las farmacias generalmente imprimen las instrucciones del medicamento slo en las cajas y no directamente en los tubos del [place].   Si su medicamento es muy caro, por favor, pngase en contacto con Zigmund Daniel llamando al [phone] y presione la opcin 4 o envenos un mensaje a travs de Pharmacist, community.   No podemos decirle cul ser su copago por los medicamentos por adelantado ya que esto es diferente dependiendo de la cobertura de su seguro. Sin embargo, es posible que podamos encontrar un medicamento sustituto a Electrical engineer un formulario para que el seguro cubra el medicamento que se considera necesario.   Si se requiere una autorizacin previa para que su compaa de seguros [place] su medicamento, por favor permtanos de 1 a 2 das hbiles para completar este proceso.  Los precios de los medicamentos varan con frecuencia dependiendo del Environmental consultant de dnde se surte la receta y alguna farmacias pueden ofrecer precios ms baratos.  El sitio web www.goodrx.com tiene cupones para medicamentos de Airline pilot. Los precios aqu no tienen en cuenta lo que podra costar con la ayuda del seguro (puede ser ms barato con su seguro), pero el sitio web puede darle el precio si no utiliz Research scientist (physical sciences).  - Puede imprimir el cupn  correspondiente y llevarlo con su receta a la farmacia.  - Tambin puede pasar por nuestra oficina durante el horario de atencin regular y Charity fundraiser una tarjeta de cupones de GoodRx.  - Si necesita que su receta se enve electrnicamente a una farmacia diferente, informe a nuestra oficina a travs de MyChart de [place] o por telfono llamando al [phone] y presione la opcin 4.   In January, start 5-fluorouracil/calcipotriene cream twice a day for 7 days to affected areas including backs of hands. Prescription sent to [facility]. Patient provided with contact information for pharmacy and advised the pharmacy will mail the prescription to their home. Patient provided with handout reviewing treatment course and side effects and advised to call or message [place] on MyChart with any concerns.

## 2021-06-27 ENCOUNTER — Encounter: Payer: Self-pay | Admitting: Dermatology

## 2021-07-21 ENCOUNTER — Other Ambulatory Visit: Payer: Self-pay | Admitting: *Deleted

## 2021-07-21 DIAGNOSIS — D500 Iron deficiency anemia secondary to blood loss (chronic): Secondary | ICD-10-CM

## 2021-07-27 ENCOUNTER — Other Ambulatory Visit: Payer: Self-pay

## 2021-07-27 ENCOUNTER — Inpatient Hospital Stay: Payer: Medicare HMO | Attending: Internal Medicine

## 2021-07-27 ENCOUNTER — Inpatient Hospital Stay: Payer: Medicare HMO

## 2021-07-27 ENCOUNTER — Inpatient Hospital Stay: Payer: Medicare HMO | Admitting: Internal Medicine

## 2021-07-27 ENCOUNTER — Encounter: Payer: Self-pay | Admitting: Internal Medicine

## 2021-07-27 DIAGNOSIS — Z801 Family history of malignant neoplasm of trachea, bronchus and lung: Secondary | ICD-10-CM | POA: Insufficient documentation

## 2021-07-27 DIAGNOSIS — D500 Iron deficiency anemia secondary to blood loss (chronic): Secondary | ICD-10-CM | POA: Diagnosis not present

## 2021-07-27 DIAGNOSIS — C679 Malignant neoplasm of bladder, unspecified: Secondary | ICD-10-CM | POA: Diagnosis present

## 2021-07-27 DIAGNOSIS — I10 Essential (primary) hypertension: Secondary | ICD-10-CM | POA: Diagnosis not present

## 2021-07-27 DIAGNOSIS — I4891 Unspecified atrial fibrillation: Secondary | ICD-10-CM | POA: Diagnosis not present

## 2021-07-27 LAB — CBC WITH DIFFERENTIAL/PLATELET
Abs Immature Granulocytes: 0.02 10*3/uL (ref 0.00–0.07)
Basophils Absolute: 0 10*3/uL (ref 0.0–0.1)
Basophils Relative: 1 %
Eosinophils Absolute: 0.1 10*3/uL (ref 0.0–0.5)
Eosinophils Relative: 2 %
HCT: 42.4 % (ref 39.0–52.0)
Hemoglobin: 13.7 g/dL (ref 13.0–17.0)
Immature Granulocytes: 0 %
Lymphocytes Relative: 15 %
Lymphs Abs: 0.8 10*3/uL (ref 0.7–4.0)
MCH: 31.4 pg (ref 26.0–34.0)
MCHC: 32.3 g/dL (ref 30.0–36.0)
MCV: 97 fL (ref 80.0–100.0)
Monocytes Absolute: 0.6 10*3/uL (ref 0.1–1.0)
Monocytes Relative: 13 %
Neutro Abs: 3.4 10*3/uL (ref 1.7–7.7)
Neutrophils Relative %: 69 %
Platelets: 225 10*3/uL (ref 150–400)
RBC: 4.37 MIL/uL (ref 4.22–5.81)
RDW: 15.3 % (ref 11.5–15.5)
WBC: 4.9 10*3/uL (ref 4.0–10.5)
nRBC: 0 % (ref 0.0–0.2)

## 2021-07-27 LAB — IRON AND TIBC
Iron: 92 ug/dL (ref 45–182)
Saturation Ratios: 32 % (ref 17.9–39.5)
TIBC: 291 ug/dL (ref 250–450)
UIBC: 199 ug/dL

## 2021-07-27 LAB — BASIC METABOLIC PANEL WITH GFR
Anion gap: 8 (ref 5–15)
BUN: 15 mg/dL (ref 8–23)
CO2: 29 mmol/L (ref 22–32)
Calcium: 9.3 mg/dL (ref 8.9–10.3)
Chloride: 102 mmol/L (ref 98–111)
Creatinine, Ser: 0.91 mg/dL (ref 0.61–1.24)
GFR, Estimated: >60 mL/min
Glucose, Bld: 102 mg/dL — ABNORMAL HIGH (ref 70–99)
Potassium: 4.1 mmol/L (ref 3.5–5.1)
Sodium: 139 mmol/L (ref 135–145)

## 2021-07-27 LAB — FERRITIN: Ferritin: 96 ng/mL (ref 24–336)

## 2021-07-27 NOTE — Progress Notes (Signed)
[facility] NOTE  Patient Care Team: [name], MD as PCP - General (Infectious Diseases)  CHIEF COMPLAINTS/PURPOSE OF CONSULTATION: Peritoneal thickening/IDA  Oncology History Overview Note  # NOV 2020- [incidental/hematuria work-up]-subtle peritoneal thickening/plaque-like lesions; March 2021-CT scan progressive omental/peritoneal thickening  #Microscopic hematuria- sec to BPH [cystoscopy/CT urogram negative; Dr.Stoiff]; March 2021 CT scan-new lesion in the bladder diverticulum  # MAY-June 2021- severe IDA sec to hemauria [hb 8.5; Ferritin- ]  #July 2022-hemorrhagic shock/bladder cancer-s/p radiation [facility]; no chemotherapy.  # on eliquis ? A.fib/pacemaker;   Urothelial carcinoma of bladder ([place])  10/29/2019 Initial Diagnosis   Urothelial carcinoma of bladder ([employer])      HISTORY OF PRESENTING ILLNESS: pt ambulating with a rolling walker accompanied by his daughter  Christopher [name] 88 y.o.  male with a history of peritoneal/omental thickening of unclear etiology; also recurrent bladder malignancy/hematuria-currently s/p radiation at [facility].  Patient denies any blood in stools.  Denies any abdominal pain nausea vomiting.  Otherwise appetite is good.  Review of Systems  Constitutional:  Negative for chills, diaphoresis, fever and weight loss.  HENT:  Negative for nosebleeds and sore throat.   Eyes:  Negative for double vision.  Respiratory:  Negative for cough, hemoptysis, sputum production and wheezing.   Cardiovascular:  Negative for chest pain, palpitations, orthopnea and leg swelling.  Gastrointestinal:  Positive for constipation. Negative for abdominal pain, blood in stool, diarrhea, heartburn, melena, nausea and vomiting.  Genitourinary:  Positive for hematuria. Negative for dysuria, frequency and urgency.  Musculoskeletal:  Negative for back pain and joint pain.  Skin: Negative.  Negative for itching and rash.  Neurological:  Negative for  dizziness, tingling, focal weakness, weakness and headaches.  Endo/Heme/Allergies:  Does not bruise/bleed easily.  Psychiatric/Behavioral:  Negative for depression. The patient is not nervous/anxious and does not have insomnia.     MEDICAL HISTORY:  Past Medical History:  Diagnosis Date   A-fib ([place])    Anemia    Arthritis    BACK   Bladder cancer (H[facility])    BPH (benign prostatic hyperplasia)    Cancer (HCC)    skin cancer on face carcinoma   ED (erectile dysfunction)    Rosanna Randy disease    HLD (hyperlipidemia)    Hypertension    Macular degeneration    Mollusca contagiosa    Osteopenia    Presence of permanent cardiac pacemaker    Sensorineural hearing loss    Squamous cell carcinoma of skin 08/25/2019   R cheek    Squamous cell carcinoma of skin 02/12/2020   L neck    Squamous cell carcinoma of skin 11/30/2020   Right elbow, [facility]   Squamous cell carcinoma of skin 03/16/2021   right base of thumb [facility]   Squamous cell carcinoma of skin 03/16/2021   right preauricular - needs surgery    SURGICAL HISTORY: Past Surgical History:  Procedure Laterality Date   CATARACT EXTRACTION Bilateral    CYSTOSCOPY WITH BIOPSY N/A 01/20/2020   Procedure: CYSTOSCOPY WITH BIOPSY;  Surgeon: Abbie Sons, MD;  Location: [facility] ORS;  Service: Urology;  Laterality: N/A;   CYSTOSCOPY WITH FULGERATION N/A 01/20/2020   Procedure: CYSTOSCOPY WITH FULGERATION;  Surgeon: Abbie Sons, MD;  Location: [facility] OR[facility];  Service: Urology;  Laterality: N/A;   CYSTOSCOPY WITH FULGERATION N/A 04/19/2020   Procedure: [place] with clot evacuation;  Surgeon: Abbie Sons, MD;  Location: [facility];  Service: Urology;  Laterality: N/A;   CYSTOSCOPY WITH  FULGERATION N/A 01/05/2021   Procedure: CYSTOSCOPY WITH FULGERATION;  Surgeon: Abbie Sons, MD;  Location: [facility] OR[facility];  Service: Urology;  Laterality: N/A;   HEMORRHOIDECTOMY WITH HEMORRHOID BANDING     KNEE ARTHROSCOPY Left    PACEMAKER  INSERTION N/A 03/03/2015   Procedure: INSERTION DUAL LEAD PACEMAKER;  Surgeon: Isaias Cowman, MD;  Location: [facility] OR[facility];  Service: Cardiovascular;  Laterality: N/A;   PULSE GENERATOR IMPLANT N/A 10/18/2020   Procedure: UNILATERAL PULSE GENERATOR IMPLANT;  Surgeon: [name], MD;  Location: [facility];  Service: Neurosurgery;  Laterality: N/A;   SPINAL CORD STIMULATOR TRIAL N/A 10/11/2020   Procedure: PERCUTANEOUS SPINAL CORD STIMULATOR TRIAL;  Surgeon: [name], MD;  Location: [facility];  Service: Neurosurgery;  Laterality: N/A;   THORACIC LAMINECTOMY FOR SPINAL CORD STIMULATOR N/A 10/18/2020   Procedure: PERCUTANEOUS LEAD PLACEMENT;  Surgeon: [name], MD;  Location: [facility];  Service: Neurosurgery;  Laterality: N/A;   TONSILLECTOMY     TRANSURETHRAL RESECTION OF BLADDER TUMOR N/A 10/07/2019   Procedure: TRANSURETHRAL RESECTION OF BLADDER TUMOR (TURBT);  Surgeon: Abbie Sons, MD;  Location: [facility];  Service: Urology;  Laterality: N/A;   TRANSURETHRAL RESECTION OF BLADDER TUMOR N/A 04/13/2020   Procedure: TRANSURETHRAL RESECTION OF BLADDER TUMOR (TURBT);  Surgeon: Abbie Sons, MD;  Location: [facility];  Service: Urology;  Laterality: N/A;    SOCIAL HISTORY: Social History   Socioeconomic History   Marital status: Widowed    Spouse name: Not on file   Number of children: Not on file   Years of education: Not on file   Highest education level: Not on file  Occupational History   Not on file  Tobacco Use   Smoking status: Never   Smokeless tobacco: Never  Vaping Use   Vaping Use: Never used  Substance and Sexual Activity   Alcohol use: Yes    Alcohol/week: 7.0 standard drinks    Types: 7 Glasses of wine per week    Comment: DAILY    Drug use: No   Sexual activity: Yes    Birth control/protection: None  Other Topics Concern   Not on file  Social History Narrative   Retd. Civil engineer, contracting; [place]; self. Never smoked; alcohol- glass wine/drink every night.      Social Determinants of Health   Financial Resource Strain: Not on file  Food Insecurity: Not on file  Transportation Needs: Not on file  Physical Activity: Not on file  Stress: Not on file  Social Connections: Not on file  Intimate Partner Violence: Not on file    FAMILY HISTORY: Family History  Problem Relation Age of Onset   Diabetes Mother    Lung cancer Father    Cancer Paternal Uncle    Prostate cancer Neg Hx    Bladder Cancer Neg Hx    Kidney cancer Neg Hx     ALLERGIES:  is allergic to atenolol.  MEDICATIONS:  Current Outpatient Medications  Medication Sig Dispense Refill   alfuzosin (UROXATRAL) 10 MG 24 hr tablet Take 1 tablet (10 mg total) by mouth daily with breakfast. 90 tablet 3   atorvastatin (LIPITOR) 10 MG tablet Take 10 mg by mouth in the morning.     calcium carbonate (OSCAL) 1500 (600 Ca) MG TABS tablet Take 600 mg of elemental calcium by mouth in the morning.     Cholecalciferol (VITAMIN D3) 50 MCG (2000 UT) TABS Take 2,000 Units by mouth in the morning.     ferrous sulfate 325 (65  FE) MG tablet Take 325 mg by mouth in the morning and at bedtime.     finasteride (PROSCAR) 5 MG tablet TAKE 1 TABLET DAILY (Patient taking differently: Take 5 mg by mouth daily.) 90 tablet 3   fluorouracil (EFUDEX) 5 % cream Apply topically 2 (two) times daily. Bid for 7 days to backs of hands, thin coat 15 g 1   Multiple Vitamin (MULTIVITAMIN WITH MINERALS) TABS tablet Take 1 tablet by mouth in the morning.     Multiple Vitamins-Minerals (PRESERVISION AREDS 2 PO) Take 1 tablet by mouth in the morning and at bedtime.     Omega-3 Fatty Acids (FISH OIL) 1000 MG CAPS Take 1,000 mg by mouth daily.     tamsulosin (FLOMAX) 0.4 MG CAPS capsule Take 0.4 mg by mouth daily.     diltiazem (CARDIZEM CD) 240 MG 24 hr capsule Take 1 capsule (240 mg total) by mouth daily. 30 capsule 0   metoprolol tartrate (LOPRESSOR) 50 MG tablet Take 1 tablet (50 mg total) by mouth 2 (two) times daily. 60  tablet 0   No current facility-administered medications for this visit.      Marland Kitchen  PHYSICAL EXAMINATION: ECOG PERFORMANCE STATUS: 0 - Asymptomatic  Vitals:   07/27/21 1056  BP: 136/89  Pulse: (!) 128  Temp: (!) 97.2 F (36.2 C)  SpO2: 99%    Filed Weights   07/27/21 1056  Weight: 169 lb 11.2 oz (77 kg)     Physical Exam HENT:     Head: Normocephalic and atraumatic.     Mouth/Throat:     Pharynx: No oropharyngeal exudate.  Eyes:     Pupils: Pupils are equal, round, and reactive to light.  Cardiovascular:     Rate and Rhythm: Normal rate and regular rhythm.  Pulmonary:     Effort: No respiratory distress.     Breath sounds: No wheezing.  Abdominal:     General: Bowel sounds are normal. There is no distension.     Palpations: Abdomen is soft. There is no mass.     Tenderness: There is no abdominal tenderness. There is no guarding or rebound.  Musculoskeletal:        General: No tenderness. Normal range of motion.     Cervical back: Normal range of motion and neck supple.  Skin:    General: Skin is warm.  Neurological:     Mental Status: He is alert and oriented to person, place, and time.  Psychiatric:        Mood and Affect: Affect normal.      LABORATORY DATA:  I have reviewed the data as listed Lab Results  Component Value Date   WBC 4.9 07/27/2021   HGB 13.7 07/27/2021   HCT 42.4 07/27/2021   MCV 97.0 07/27/2021   PLT 225 07/27/2021   Recent Labs    01/14/21 0522 01/15/21 0559 01/20/21 0621 03/30/21 1251 07/27/21 1036  NA 138   < > 138 138 139  K 3.5   < > 3.3* 4.2 4.1  CL 99   < > 96* 104 102  CO2 32   < > 33* 27 29  GLUCOSE 109*   < > 125* 122* 102*  BUN 24*   < > 46* 20 15  CREATININE 1.06   < > 1.08 1.08 0.91  CALCIUM 8.3*   < > 9.1 9.1 9.3  GFRNONAA >60   < > >60 >60 >60  ALBUMIN 2.2*  --   --   --   --    < > =  values in this interval not displayed.    RADIOGRAPHIC STUDIES: I have personally reviewed the radiological images as  listed and agreed with the findings in the report. No results found.  ASSESSMENT & PLAN:   Iron deficiency anemia due to chronic blood loss #Iron deficiency anemia secondary to chronic blood loss/hematuria [see below]- S/p  IV Venofer; Today Hb-13.4.HOLD with venofer today.   # JUNE 2022- CT [facility]- Multiple recurrent high-grade-suspicious for muscle invasive urothelial malignancy; possible extravascular extension of the tumor [biopsy-[facility] negative for muscle invasion].  S/p  radiation at [facility].  Defer to [facility] for further management.  # A.fib- currently off eliquis [Dr.Fath]-given patient's recent hemorrhagic shock/July 2022; however, ok to -restart Eliquis on hematology [place]. No urinary bleeding.  Patient will reach out to urology/Dr. Ubaldo Glassing.  # Omental/peritoneal thickening-unclear etiology; follow-up CT June 2022 [[facility]]-negative for any progression.  Monitor for now.  # DISPOSITION: # HOLD venofer infusion today # follow up in 6 months  MD; labs; cbc/bmp; iron studies/ ferritin-;Possible Venofer-Dr.B    All questions were answered. The patient knows to call the clinic with any problems, questions or concerns.    Cammie Sickle, MD 07/27/2021 11:27 AM

## 2021-07-27 NOTE — Addendum Note (Signed)
Addended by: Vanice Sarah on: 07/27/2021 11:32 AM   Modules accepted: Orders

## 2021-07-27 NOTE — Assessment & Plan Note (Addendum)
#  Iron deficiency anemia secondary to chronic blood loss/hematuria [see below]- S/p  IV Venofer; Today Hb-13.4.HOLD with venofer today.   # JUNE 2022- CT [facility]- Multiple recurrent high-grade-suspicious for muscle invasive urothelial malignancy; possible extravascular extension of the tumor [biopsy-[facility] negative for muscle invasion].  S/p  radiation at [facility].  Defer to [facility] for further management.  # A.fib- currently off eliquis [Dr.Fath]-given patient's recent hemorrhagic shock/July 2022; however, ok to -restart Eliquis on hematology [place]. No urinary bleeding.  Patient will reach out to urology/Dr. Ubaldo Glassing.  # Omental/peritoneal thickening-unclear etiology; follow-up CT June 2022 [[facility]]-negative for any progression.  Monitor for now.  # DISPOSITION: # HOLD venofer infusion today # follow up in 6 months  MD; labs; cbc/bmp; iron studies/ ferritin-;Possible Venofer-Dr.B

## 2021-07-27 NOTE — Progress Notes (Signed)
Pt concerned with frequent urination, only able to release a small amount at a time.  Also, concerned with his significant weight lose.  Pt wanting to know if he could get his radiation tx here?

## 2021-08-31 ENCOUNTER — Ambulatory Visit: Payer: Medicare HMO | Admitting: Dermatology

## 2021-10-05 ENCOUNTER — Telehealth: Payer: Self-pay

## 2021-10-05 ENCOUNTER — Other Ambulatory Visit: Payer: Self-pay

## 2021-10-05 ENCOUNTER — Ambulatory Visit: Payer: Medicare HMO | Admitting: Dermatology

## 2021-10-05 ENCOUNTER — Encounter: Payer: Self-pay | Admitting: Dermatology

## 2021-10-05 DIAGNOSIS — C4492 Squamous cell carcinoma of skin, unspecified: Secondary | ICD-10-CM

## 2021-10-05 DIAGNOSIS — L578 Other skin changes due to chronic exposure to nonionizing radiation: Secondary | ICD-10-CM | POA: Diagnosis not present

## 2021-10-05 DIAGNOSIS — D492 Neoplasm of unspecified behavior of bone, soft tissue, and skin: Secondary | ICD-10-CM

## 2021-10-05 DIAGNOSIS — L820 Inflamed seborrheic keratosis: Secondary | ICD-10-CM

## 2021-10-05 DIAGNOSIS — C44622 Squamous cell carcinoma of skin of right upper limb, including shoulder: Secondary | ICD-10-CM | POA: Diagnosis not present

## 2021-10-05 DIAGNOSIS — C44329 Squamous cell carcinoma of skin of other parts of face: Secondary | ICD-10-CM

## 2021-10-05 DIAGNOSIS — L570 Actinic keratosis: Secondary | ICD-10-CM

## 2021-10-05 NOTE — Patient Instructions (Addendum)
Cryotherapy Aftercare  Wash gently with soap and water everyday.   Apply Vaseline and Band-Aid daily until healed.    Wound Care Instructions  Cleanse wound gently with soap and water once a day then pat dry with clean gauze. Apply a thing coat of Petrolatum (petroleum jelly, "Vaseline") over the wound (unless you have an allergy to this). We recommend that you use a new, sterile tube of Vaseline. Do not pick or remove scabs. Do not remove the yellow or white "healing tissue" from the base of the wound.  Cover the wound with fresh, clean, nonstick gauze and secure with paper tape. You may use Band-Aids in place of gauze and tape if the would is small enough, but would recommend trimming much of the tape off as there is often too much. Sometimes Band-Aids can irritate the skin.  You should call the office for your biopsy report after 1 week if you have not already been contacted.  If you experience any problems, such as abnormal amounts of bleeding, swelling, significant bruising, significant pain, or evidence of infection, please call the office immediately.  FOR ADULT SURGERY PATIENTS: If you need something for pain relief you may take 1 extra strength Tylenol (acetaminophen) AND 2 Ibuprofen (200mg each) together every 4 hours as needed for pain. (do not take these if you are allergic to them or if you have a reason you should not take them.) Typically, you may only need pain medication for 1 to 3 days.        If You Need Anything After Your Visit  If you have any questions or concerns for your doctor, please call our main line at [phone] and press option 4 to reach your doctor's medical assistant. If no one answers, please leave a voicemail as directed and we will return your call as soon as possible. Messages left after 4 pm will be answered the following business day.   You may also send us a message via MyChart. We typically respond to MyChart messages within 1-2 business  days.  For prescription refills, please ask your pharmacy to contact our office. Our fax number is [phone].  If you have an urgent issue when the clinic is closed that cannot wait until the next business day, you can page your doctor at the number below.    Please note that while we do our best to be available for urgent issues outside of office hours, we are not available 24/7.   If you have an urgent issue and are unable to reach us, you may choose to seek medical care at your doctor's office, retail clinic, urgent care center, or emergency room.  If you have a medical emergency, please immediately call 911 or go to the emergency department.  Pager Numbers  - Dr. Kowalski: [phone]  - Dr. Moye: [phone]  - Dr. Stewart: [phone]  In the event of inclement weather, please call our main line at [phone] for an update on the status of any delays or closures.  Dermatology Medication Tips: Please keep the boxes that topical medications come in in order to help keep track of the instructions about where and how to use these. Pharmacies typically print the medication instructions only on the boxes and not directly on the medication tubes.   If your medication is too expensive, please contact our office at [phone] option 4 or send us a message through MyChart.   We are unable to tell what your co-pay for medications will be   in advance as this is different depending on your insurance coverage. However, we may be able to find a substitute medication at lower cost or fill out paperwork to get insurance to cover a needed medication.   If a prior authorization is required to get your medication covered by your insurance company, please allow us 1-2 business days to complete this process.  Drug prices often vary depending on where the prescription is filled and some pharmacies may offer cheaper prices.  The website www.goodrx.com contains coupons for medications through  different pharmacies. The prices here do not account for what the cost may be with help from insurance (it may be cheaper with your insurance), but the website can give you the price if you did not use any insurance.  - You can print the associated coupon and take it with your prescription to the pharmacy.  - You may also stop by our office during regular business hours and pick up a GoodRx coupon card.  - If you need your prescription sent electronically to a different pharmacy, notify our office through [place] MyChart or by phone at [phone] option 4.     Si Usted Necesita Algo Despus de Su Visita  Tambin puede enviarnos un mensaje a travs de MyChart. Por lo general respondemos a los mensajes de MyChart en el transcurso de 1 a 2 das hbiles.  Para renovar recetas, por favor pida a su farmacia que se ponga en contacto con nuestra oficina. Nuestro nmero de fax es el [phone].  Si tiene un asunto urgente cuando la clnica est cerrada y que no puede esperar hasta el siguiente da hbil, puede llamar/localizar a su doctor(a) al nmero que aparece a continuacin.   Por favor, tenga en cuenta que aunque hacemos todo lo posible para estar disponibles para asuntos urgentes fuera del horario de oficina, no estamos disponibles las 24 horas del da, los 7 das de la semana.   Si tiene un problema urgente y no puede comunicarse con nosotros, puede optar por buscar atencin mdica  en el consultorio de su doctor(a), en una clnica privada, en un centro de atencin urgente o en una sala de emergencias.  Si tiene una emergencia mdica, por favor llame inmediatamente al 911 o vaya a la sala de emergencias.  Nmeros de bper  - Dr. Kowalski: [phone]  - Dra. Moye: [phone]  - Dra. Stewart: [phone]  En caso de inclemencias del tiempo, por favor llame a nuestra lnea principal al [phone] para una actualizacin sobre el estado de cualquier retraso o cierre.  Consejos  para la medicacin en dermatologa: Por favor, guarde las cajas en las que vienen los medicamentos de uso tpico para ayudarle a seguir las instrucciones sobre dnde y cmo usarlos. Las farmacias generalmente imprimen las instrucciones del medicamento slo en las cajas y no directamente en los tubos del medicamento.   Si su medicamento es muy caro, por favor, pngase en contacto con nuestra oficina llamando al [phone] y presione la opcin 4 o envenos un mensaje a travs de MyChart.   No podemos decirle cul ser su copago por los medicamentos por adelantado ya que esto es diferente dependiendo de la cobertura de su seguro. Sin embargo, es posible que podamos encontrar un medicamento sustituto a menor costo o llenar un formulario para que el seguro cubra el medicamento que se considera necesario.   Si se requiere una autorizacin previa para que su compaa de seguros cubra su medicamento, por favor permtanos de 1 a   2 das hbiles para completar este proceso.  Los precios de los medicamentos varan con frecuencia dependiendo del lugar de dnde se surte la receta y alguna farmacias pueden ofrecer precios ms baratos.  El sitio web www.goodrx.com tiene cupones para medicamentos de diferentes farmacias. Los precios aqu no tienen en cuenta lo que podra costar con la ayuda del seguro (puede ser ms barato con su seguro), pero el sitio web puede darle el precio si no utiliz ningn seguro.  - Puede imprimir el cupn correspondiente y llevarlo con su receta a la farmacia.  - Tambin puede pasar por nuestra oficina durante el horario de atencin regular y recoger una tarjeta de cupones de GoodRx.  - Si necesita que su receta se enve electrnicamente a una farmacia diferente, informe a nuestra oficina a travs de MyChart de [place] o por telfono llamando al [phone] y presione la opcin 4.  

## 2021-10-05 NOTE — Telephone Encounter (Signed)
Referral of SCC on the right preauricular sent/emailed to [facility]. aw ?

## 2021-10-05 NOTE — Progress Notes (Signed)
? ?Follow-Up Visit ?  ?Subjective  ?Christopher [name] is a 88 y.o. male who presents for the following: Actinic Keratosis (3 months f/u on Aks.  ). ?The patient has spots, moles and lesions to be evaluated, some may be new or changing and the patient has concerns that these could be cancer.  ?Daughter Magda Paganini with patient who contributes to history. ? ?The following portions of the chart were reviewed this encounter and updated as appropriate:  ? Tobacco  Allergies  Meds  Problems  Med Hx  Surg Hx  Fam Hx   ?  ?Review of Systems:  No other skin or systemic complaints except as noted in HPI or Assessment and Plan. ? ?Objective  ?Well appearing patient in no apparent distress; mood and affect are within normal limits. ? ?A focused examination was performed including face,neck,scalp,arms. Relevant physical exam findings are noted in the Assessment and Plan. ? ?right preauricular ?1.7 cm pink plaque - site of SCC biopsy,  ?no lymphadenopathy of neck ? ?right dorsum hand ?0.7 cm hyperkeratotic papule  ? ? ? ? ? ? ?face, scalp x 16 ?Erythematous thin papules/macules with gritty scale.  ? ?face x 3 ?Stuck-on, waxy, tan-brown papules  ? ?Assessment & Plan  ?Squamous cell carcinoma of skin ?right preauricular ?Biopsy proven POORLY DIFFERENTIATED SQUAMOUS CELL CARCINOMA, BASE INVOLVED- needs surgery excision removal ? ?Discussed phone call message from 03/23/2021: ?Advised patient's daughter Lattie Haw of results. He was recently diagnosed with bladder cancer and is in the process of scheduling his treatment dates for that. She will have him call back when those dates are scheduled to schedule an appointment for surgery here.  ? ?Counseling: ?Discussed with patient and daughter Magda Paganini) that we never got a return call from pt or family and pt has not been scheduled for surgery.  Discussed this Poorly Differentiated SCC needs surgery. ?We will schedule patient for surgery in our office earliest appointment we have is May 16 at  9:30, we will also send a referral for [facility] to [facility] surgery center to see if they can see this patient any earlier. ? ?Neoplasm of skin - R/O SCC ?right dorsum hand ?Epidermal / dermal shaving ? ?Lesion diameter (cm):  0.7 ?Informed consent: discussed and consent obtained   ?Timeout: patient name, date of birth, surgical site, and procedure verified   ?Procedure prep:  Patient was prepped and draped in usual sterile fashion ?Prep type:  Isopropyl alcohol ?Anesthesia: the lesion was anesthetized in a standard fashion   ?Anesthetic:  1% lidocaine w/ epinephrine 1-100,000 buffered w/ 8.4% NaHCO3 ?Hemostasis achieved with: pressure, aluminum chloride and electrodesiccation   ?Outcome: patient tolerated procedure well   ?Post-procedure details: sterile dressing applied and wound care instructions given   ?Dressing type: bandage and petrolatum   ? ?Destruction of lesion ? ?Destruction method: electrodesiccation and curettage   ?Informed consent: discussed and consent obtained   ?Timeout:  patient name, date of birth, surgical site, and procedure verified ?Anesthesia: the lesion was anesthetized in a standard fashion   ?Anesthetic:  1% lidocaine w/ epinephrine 1-100,000 buffered w/ 8.4% NaHCO3 ?Curettage performed in three different directions: Yes   ?Electrodesiccation performed over the curetted area: Yes   ?Curettage cycles:  3 ?Lesion length (cm):  0.7 ?Lesion width (cm):  0.7 ?Margin per side (cm):  0.2 ?Final wound size (cm):  1.1 ?Hemostasis achieved with:  electrodesiccation ?Outcome: patient tolerated procedure well with no complications   ?Post-procedure details: sterile dressing applied and wound care instructions given   ?  Dressing type: petrolatum   ? ?Specimen 1 - Surgical pathology ?Differential Diagnosis: R/O SCC ?Check Margins: No ? ?AK (actinic keratosis) ?face, scalp x 16 ?Actinic keratoses are precancerous spots that appear secondary to cumulative UV radiation exposure/sun exposure over time. They are  chronic with expected duration over 1 year. A portion of actinic keratoses will progress to squamous cell carcinoma of the skin. It is not possible to reliably predict which spots will progress to skin cancer and so treatment is recommended to prevent development of skin cancer. ? ?Recommend daily broad spectrum sunscreen SPF 30+ to sun-exposed areas, reapply every 2 hours as needed.  ?Recommend staying in the shade or wearing long sleeves, sun glasses (UVA+UVB protection) and wide brim hats (4-inch brim around the entire circumference of the hat). ?Call for new or changing lesions.  ? ?Destruction of lesion - face, scalp x 16 ?Complexity: simple   ?Destruction method: cryotherapy   ?Informed consent: discussed and consent obtained   ?Timeout:  patient name, date of birth, surgical site, and procedure verified ?Lesion destroyed using liquid nitrogen: Yes   ?Region frozen until ice ball extended beyond lesion: Yes   ?Outcome: patient tolerated procedure well with no complications   ?Post-procedure details: wound care instructions given   ? ?Inflamed seborrheic keratosis ?face x 3 ?Reassured benign age-related growth.  Recommend observation.  Discussed cryotherapy if spot(s) become irritated or inflamed.  ? ?Destruction of lesion - face x 3 ?Complexity: simple   ?Destruction method: cryotherapy   ?Informed consent: discussed and consent obtained   ?Timeout:  patient name, date of birth, surgical site, and procedure verified ?Lesion destroyed using liquid nitrogen: Yes   ?Region frozen until ice ball extended beyond lesion: Yes   ?Outcome: patient tolerated procedure well with no complications   ?Post-procedure details: wound care instructions given   ? ?Actinic Damage - severe with hx of multiple skin cancers ?- chronic, secondary to cumulative UV radiation exposure/sun exposure over time ?- diffuse scaly erythematous macules with underlying dyspigmentation ?- Recommend daily broad spectrum sunscreen SPF 30+ to  sun-exposed areas, reapply every 2 hours as needed.  ?- Recommend staying in the shade or wearing long sleeves, sun glasses (UVA+UVB protection) and wide brim hats (4-inch brim around the entire circumference of the hat). ?- Call for new or changing lesions. ? ?Return in about 3 months (around 01/05/2022) for AKs, SCC. ? ?I, Marye Round, CMA, am acting as scribe for Sarina Ser, MD .  ?Documentation: I have reviewed the above documentation for accuracy and completeness, and I agree with the above. ? ?Sarina Ser, MD ? ?

## 2021-10-06 ENCOUNTER — Encounter: Payer: Self-pay | Admitting: Dermatology

## 2021-10-06 ENCOUNTER — Telehealth: Payer: Self-pay

## 2021-10-06 NOTE — Telephone Encounter (Signed)
-----   Message from Ralene Bathe, MD sent at 10/06/2021  5:23 PM EDT ----- ?Diagnosis ?Skin , right dorsum hand ?WELL DIFFERENTIATED SQUAMOUS CELL CARCINOMA, BASE INVOLVED ? ?Cancer - SCC ?Already treated ?Recheck next visit ?

## 2021-10-06 NOTE — Telephone Encounter (Signed)
Advised patient's daughter of results/hd 

## 2021-10-10 ENCOUNTER — Telehealth: Payer: Self-pay

## 2021-10-10 NOTE — Telephone Encounter (Signed)
Called spoke with pt daughter Magda Paganini, pt is scheduled at the skin surgery ctr May 11 with Dr Winifred Olive, pt daughter would like to know when she he schedule to see Dr Raliegh Ip again? ? ?Surgery appt here May 16 will be canceled due to skin surgery ctr appt May 11 to take care of the Skin cancer  ?

## 2021-11-14 ENCOUNTER — Other Ambulatory Visit: Payer: Self-pay | Admitting: Urology

## 2021-11-16 ENCOUNTER — Ambulatory Visit: Payer: Medicare HMO | Admitting: Dermatology

## 2021-11-22 ENCOUNTER — Ambulatory Visit: Payer: Medicare HMO | Admitting: Dermatology

## 2021-11-22 ENCOUNTER — Encounter: Payer: Medicare HMO | Admitting: Dermatology

## 2021-11-22 DIAGNOSIS — C44320 Squamous cell carcinoma of skin of unspecified parts of face: Secondary | ICD-10-CM

## 2021-11-22 DIAGNOSIS — L820 Inflamed seborrheic keratosis: Secondary | ICD-10-CM

## 2021-11-22 DIAGNOSIS — C4492 Squamous cell carcinoma of skin, unspecified: Secondary | ICD-10-CM

## 2021-11-22 DIAGNOSIS — L578 Other skin changes due to chronic exposure to nonionizing radiation: Secondary | ICD-10-CM

## 2021-11-22 DIAGNOSIS — D492 Neoplasm of unspecified behavior of bone, soft tissue, and skin: Secondary | ICD-10-CM

## 2021-11-22 DIAGNOSIS — L570 Actinic keratosis: Secondary | ICD-10-CM

## 2021-11-22 DIAGNOSIS — C44329 Squamous cell carcinoma of skin of other parts of face: Secondary | ICD-10-CM | POA: Diagnosis not present

## 2021-11-22 NOTE — Patient Instructions (Addendum)
If You Need Anything After Your Visit ? ?If you have any questions or concerns for your doctor, please call our main line at [phone] and press option 4 to reach your doctor's medical assistant. If no one answers, please leave a voicemail as directed and we will return your call as soon as possible. Messages left after 4 pm will be answered the following business day.  ? ?You may also send us a message via MyChart. We typically respond to MyChart messages within 1-2 business days. ? ?For prescription refills, please ask your pharmacy to contact our office. Our fax number is [phone]. ? ?If you have an urgent issue when the clinic is closed that cannot wait until the next business day, you can page your doctor at the number below.   ? ?Please note that while we do our best to be available for urgent issues outside of office hours, we are not available 24/7.  ? ?If you have an urgent issue and are unable to reach us, you may choose to seek medical care at your doctor's office, retail clinic, urgent care center, or emergency room. ? ?If you have a medical emergency, please immediately call 911 or go to the emergency department. ? ?Pager Numbers ? ?- Dr. Kowalski: [phone] ? ?- Dr. Moye: [phone] ? ?- Dr. Stewart: [phone] ? ?In the event of inclement weather, please call our main line at [phone] for an update on the status of any delays or closures. ? ?Dermatology Medication Tips: ?Please keep the boxes that topical medications come in in order to help keep track of the instructions about where and how to use these. Pharmacies typically print the medication instructions only on the boxes and not directly on the medication tubes.  ? ?If your medication is too expensive, please contact our office at [phone] option 4 or send us a message through MyChart.  ? ?We are unable to tell what your co-pay for medications will be in advance as this is different depending on your insurance coverage.  However, we may be able to find a substitute medication at lower cost or fill out paperwork to get insurance to cover a needed medication.  ? ?If a prior authorization is required to get your medication covered by your insurance company, please allow us 1-2 business days to complete this process. ? ?Drug prices often vary depending on where the prescription is filled and some pharmacies may offer cheaper prices. ? ?The website www.goodrx.com contains coupons for medications through different pharmacies. The prices here do not account for what the cost may be with help from insurance (it may be cheaper with your insurance), but the website can give you the price if you did not use any insurance.  ?- You can print the associated coupon and take it with your prescription to the pharmacy.  ?- You may also stop by our office during regular business hours and pick up a GoodRx coupon card.  ?- If you need your prescription sent electronically to a different pharmacy, notify our office through [facility] MyChart or by phone at [phone] option 4. ? ? ? ? ?Si Usted Necesita Algo Despu?s de Su Visita ? ?Tambi?n puede enviarnos un mensaje a trav?s de MyChart. Por lo general respondemos a los mensajes de MyChart en el transcurso de 1 a 2 d?as h?biles. ? ?Para renovar recetas, por favor pida a su farmacia que se ponga en contacto con nuestra oficina. Nuestro n?mero de fax es el [phone]. ? ?Si tiene   un asunto urgente cuando la cl?nica est? cerrada y que no puede esperar hasta el siguiente d?a h?bil, puede llamar/localizar a su doctor(a) al n?mero que aparece a continuaci?n.  ? ?Por favor, tenga en cuenta que aunque hacemos todo lo posible para estar disponibles para asuntos urgentes fuera del horario de oficina, no estamos disponibles las 24 horas del d?a, los 7 d?as de la semana.  ? ?Si tiene un problema urgente y no puede comunicarse con nosotros, puede optar por buscar atenci?n m?dica  en el consultorio de su  doctor(a), en una cl?nica privada, en un centro de atenci?n urgente o en una sala de emergencias. ? ?Si tiene Engineer, maintenance (IT) m?dica, por favor llame inmediatamente al 911 o vaya a la sala de emergencias. ? ?N?meros de b?per ? ?- Dr. Nehemiah Massed: [phone] ? ?- Dra. Moye: [phone] ? ?- Dra. Nicole Kindred: [phone] ? ?En caso de inclemencias del tiempo, por favor llame a nuestra l?nea principal al [phone] para una actualizaci?n sobre el estado de cualquier retraso o cierre. ? ?Consejos para la medicaci?n en dermatolog?a: ?Por favor, guarde las cajas en las que vienen los medicamentos de uso t?pico para ayudarle a seguir las instrucciones sobre d?nde y c?mo usarlos. Las farmacias generalmente imprimen las instrucciones del medicamento s?lo en las cajas y no directamente en los tubos del [place].  ? ?Si su medicamento es muy caro, por favor, p?ngase en contacto con Zigmund Daniel llamando al [phone] y presione la opci?n 4 o env?enos un mensaje a trav?s de MyChart.  ? ?No podemos decirle cu?l ser? su copago por los medicamentos por adelantado ya que esto es diferente dependiendo de la cobertura de su seguro. Sin embargo, es posible que podamos encontrar un medicamento sustituto a Electrical engineer un formulario para que el seguro cubra el medicamento que se considera necesario.  ? ?Si se requiere Ardelia Mems autorizaci?n previa para que su compa??a de seguros [place] su medicamento, por favor perm?tanos de 1 a 2 d?as h?biles para completar este proceso. ? ?Los precios de los medicamentos var?an con frecuencia dependiendo del Environmental consultant de d?nde se surte la receta y alguna farmacias pueden ofrecer precios m?s baratos. ? ?El sitio web www.goodrx.com tiene cupones para medicamentos de Airline pilot. Los precios aqu? no tienen en cuenta lo que podr?a costar con la ayuda del seguro (puede ser m?s barato con su seguro), pero el sitio web puede darle el precio si no utiliz? ning?n seguro.  ?- Puede imprimir el cup?n  correspondiente y llevarlo con su receta a la farmacia.  ?- Tambi?n puede pasar por nuestra oficina durante el horario de atenci?n regular y recoger una tarjeta de cupones de GoodRx.  ?- Si necesita que su receta se env?e electr?nicamente a Chiropodist, informe a nuestra oficina a trav?s de MyChart de [place] o por tel?fono llamando al [phone] y presione la opci?n 4.  ? ? ? ? ?If You Need Anything After Your Visit ? ?If you have any questions or concerns for your doctor, please call our main line at [phone] and press option 4 to reach your doctor's medical assistant. If no one answers, please leave a voicemail as directed and we will return your call as soon as possible. Messages left after 4 pm will be answered the following business day.  ? ?You may also send [place] a message via MyChart. We typically respond to MyChart messages within 1-2 business days. ? ?For prescription refills, please ask your pharmacy to contact our office. Our fax number is  [phone]. ? ?If you have an urgent issue when the clinic is closed that cannot wait until the next business day, you can page your doctor at the number below.   ? ?Please note that while we do our best to be available for urgent issues outside of office hours, we are not available 24/7.  ? ?If you have an urgent issue and are unable to reach [place], you may choose to seek medical care at your doctor's office, retail clinic, urgent care center, or emergency room. ? ?If you have a medical emergency, please immediately call 911 or go to the emergency department. ? ?Pager Numbers ? ?- Dr. Nehemiah Massed: [phone] ? ?- Dr. Laurence Ferrari: [phone] ? ?- Dr. Nicole Kindred: [phone] ? ?In the event of inclement weather, please call our main line at [phone] for an update on the status of any delays or closures. ? ?Dermatology Medication Tips: ?Please keep the boxes that topical medications come in in order to help keep track of the instructions about where and how  to use these. Pharmacies typically print the medication instructions only on the boxes and not directly on the medication tubes.  ? ?If your medication is too expensive, please contact our office at [phone]

## 2021-11-22 NOTE — Progress Notes (Signed)
? ?Follow-Up Visit ?  ?Subjective  ?Christopher [name] is a 88 y.o. male who presents for the following: Skin Problem (Check spots on his face ). ?Patient had Mohs surgery at [facility] in [facility] 11/17/2021 at the right preauricular- biopsy proven SCC, pathologist requested pt come here to have spots some new spos on his face checked.  ? ?Daughter Magda Paganini with patient contributing to history  ? ?The following portions of the chart were reviewed this encounter and updated as appropriate:  ? Tobacco  Allergies  Meds  Problems  Med Hx  Surg Hx  Fam Hx   ?  ?Review of Systems:  No other skin or systemic complaints except as noted in HPI or Assessment and Plan. ? ?Objective  ?Well appearing patient in no apparent distress; mood and affect are within normal limits. ? ?A focused examination was performed including face. Relevant physical exam findings are noted in the Assessment and Plan. ? ?right below left ear lobe ?Erythematous thin papules/macules with gritty scale.  ? ? ? ? ?left cheek ?Stuck-on, waxy, tan-brown papule --Discussed benign etiology and prognosis.  ? ? ? ? ?right inf preauricular anterior ?1.1 cm crusted papule  ? ? ? ? ?right inf preauricular posterior ?1.2 cm crusted papule  ? ? ? ? ?right preauricular ?Scar healing with sutures ? ?Assessment & Plan  ?AK (actinic keratosis) ?right below left ear lobe ?See photo ?Actinic keratoses are precancerous spots that appear secondary to cumulative UV radiation exposure/sun exposure over time. They are chronic with expected duration over 1 year. A portion of actinic keratoses will progress to squamous cell carcinoma of the skin. It is not possible to reliably predict which spots will progress to skin cancer and so treatment is recommended to prevent development of skin cancer. ? ?Recommend daily broad spectrum sunscreen SPF 30+ to sun-exposed areas, reapply every 2 hours as needed.  ?Recommend staying in the shade or wearing long  sleeves, sun glasses (UVA+UVB protection) and wide brim hats (4-inch brim around the entire circumference of the hat). ?Call for new or changing lesions.  ? ?Destruction of lesion - right below left ear lobe ?Complexity: simple   ?Destruction method: cryotherapy   ?Informed consent: discussed and consent obtained   ?Timeout:  patient name, date of birth, surgical site, and procedure verified ?Lesion destroyed using liquid nitrogen: Yes   ?Region frozen until ice ball extended beyond lesion: Yes   ?Outcome: patient tolerated procedure well with no complications   ?Post-procedure details: wound care instructions given   ? ?Inflamed seborrheic keratosis ?left cheek ?See photo ?Destruction of lesion - left cheek ?Complexity: simple   ?Destruction method: cryotherapy   ?Informed consent: discussed and consent obtained   ?Timeout:  patient name, date of birth, surgical site, and procedure verified ?Lesion destroyed using liquid nitrogen: Yes   ?Region frozen until ice ball extended beyond lesion: Yes   ?Outcome: patient tolerated procedure well with no complications   ?Post-procedure details: wound care instructions given   ? ?Neoplasm of skin (2) ?right inf preauricular anterior ?Epidermal / dermal shaving ? ?Lesion diameter (cm):  1.1 ?Informed consent: discussed and consent obtained   ?Timeout: patient name, date of birth, surgical site, and procedure verified   ?Procedure prep:  Patient was prepped and draped in usual sterile fashion ?Prep type:  Isopropyl alcohol ?Anesthesia: the lesion was anesthetized in a standard fashion   ?Anesthetic:  1% lidocaine w/ epinephrine 1-100,000 buffered w/ 8.4% NaHCO3 ?Hemostasis achieved with: pressure, aluminum  chloride and electrodesiccation   ?Outcome: patient tolerated procedure well   ?Post-procedure details: sterile dressing applied and wound care instructions given   ?Dressing type: bandage and petrolatum   ? ?Destruction of lesion ? ?Destruction method: electrodesiccation  and curettage   ?Informed consent: discussed and consent obtained   ?Timeout:  patient name, date of birth, surgical site, and procedure verified ?Anesthesia: the lesion was anesthetized in a standard fashion   ?Anesthetic:  1% lidocaine w/ epinephrine 1-100,000 buffered w/ 8.4% NaHCO3 ?Curettage performed in three different directions: Yes   ?Electrodesiccation performed over the curetted area: Yes   ?Curettage cycles:  3 ?Lesion length (cm):  1.1 ?Lesion width (cm):  1.1 ?Margin per side (cm):  0.2 ?Final wound size (cm):  1.5 ?Hemostasis achieved with:  electrodesiccation ?Outcome: patient tolerated procedure well with no complications   ?Post-procedure details: sterile dressing applied and wound care instructions given   ?Dressing type: petrolatum   ? ?Specimen 1 - Surgical pathology ?Differential Diagnosis: R/IO SCC ? ?Check Margins: No ? ?right inf preauricular posterior ?Epidermal / dermal shaving ? ?Lesion diameter (cm):  1.2 ?Informed consent: discussed and consent obtained   ?Timeout: patient name, date of birth, surgical site, and procedure verified   ?Procedure prep:  Patient was prepped and draped in usual sterile fashion ?Prep type:  Isopropyl alcohol ?Anesthesia: the lesion was anesthetized in a standard fashion   ?Anesthetic:  1% lidocaine w/ epinephrine 1-100,000 buffered w/ 8.4% NaHCO3 ?Hemostasis achieved with: pressure, aluminum chloride and electrodesiccation   ?Outcome: patient tolerated procedure well   ?Post-procedure details: sterile dressing applied and wound care instructions given   ?Dressing type: bandage and petrolatum   ? ?Destruction of lesion ? ?Destruction method: electrodesiccation and curettage   ?Informed consent: discussed and consent obtained   ?Timeout:  patient name, date of birth, surgical site, and procedure verified ?Anesthesia: the lesion was anesthetized in a standard fashion   ?Anesthetic:  1% lidocaine w/ epinephrine 1-100,000 buffered w/ 8.4% NaHCO3 ?Curettage  performed in three different directions: Yes   ?Electrodesiccation performed over the curetted area: Yes   ?Curettage cycles:  3 ?Lesion length (cm):  1.2 ?Lesion width (cm):  1.2 ?Margin per side (cm):  0.2 ?Final wound size (cm):  1.6 ?Hemostasis achieved with:  electrodesiccation ?Outcome: patient tolerated procedure well with no complications   ?Post-procedure details: sterile dressing applied and wound care instructions given   ?Dressing type: petrolatum   ? ?Specimen 2 - Surgical pathology ?Differential Diagnosis: R/O SCC ? ?Check Margins: No ? ?Poorly differentiated squamous cell carcinoma of skin -  ?S/P MOHS ?right preauricular ?Biopsy proven SCC- Scar from previous Mohs surgery treatment 11/17/2021 ?Keep scheduled appointment at Skin surgery center in [place]  ?                        ?Actinic Damage ?- chronic, secondary to cumulative UV radiation exposure/sun exposure over time ?- diffuse scaly erythematous macules with underlying dyspigmentation ?- Recommend daily broad spectrum sunscreen SPF 30+ to sun-exposed areas, reapply every 2 hours as needed.  ?- Recommend staying in the shade or wearing long sleeves, sun glasses (UVA+UVB protection) and wide brim hats (4-inch brim around the entire circumference of the hat). ?- Call for new or changing lesions. ?                         ?Return in about 5 weeks (around 12/27/2021) for recheck face hx of  skin cancer . ? ?I, Marye Round, CMA, am acting as scribe for Sarina Ser, MD .  ?Documentation: I have reviewed the above documentation for accuracy and completeness, and I agree with the above. ? ?Sarina Ser, MD ? ?

## 2021-11-23 ENCOUNTER — Encounter: Payer: Self-pay | Admitting: Dermatology

## 2021-11-23 ENCOUNTER — Telehealth: Payer: Self-pay

## 2021-11-23 NOTE — Telephone Encounter (Signed)
Advised pt's daughter of bx results/sh ?

## 2021-11-23 NOTE — Telephone Encounter (Signed)
-----   Message from Ralene Bathe, MD sent at 11/23/2021  5:17 PM EDT ----- ?Diagnosis ?1. Skin (M), right inf preauricular anterior ?WELL DIFFERENTIATED SQUAMOUS CELL CARCINOMA, BASE INVOLVED ?2. Skin (M), right inf preauricular posterior ?WELL DIFFERENTIATED SQUAMOUS CELL CARCINOMA, BASE INVOLVED ? ?1&2 - both Cancer - SCC ?Both already treated ?Recheck next visit ?

## 2021-12-06 ENCOUNTER — Telehealth: Payer: Self-pay

## 2021-12-06 NOTE — Telephone Encounter (Signed)
Updating MOHs from progress notes and photos.

## 2021-12-29 ENCOUNTER — Ambulatory Visit: Payer: Medicare HMO | Admitting: Dermatology

## 2021-12-29 DIAGNOSIS — L820 Inflamed seborrheic keratosis: Secondary | ICD-10-CM | POA: Diagnosis not present

## 2021-12-29 DIAGNOSIS — Z85828 Personal history of other malignant neoplasm of skin: Secondary | ICD-10-CM | POA: Diagnosis not present

## 2021-12-29 DIAGNOSIS — L570 Actinic keratosis: Secondary | ICD-10-CM

## 2021-12-29 DIAGNOSIS — L578 Other skin changes due to chronic exposure to nonionizing radiation: Secondary | ICD-10-CM

## 2021-12-29 NOTE — Patient Instructions (Addendum)
Cryotherapy Aftercare  Wash gently with soap and water everyday.   Apply Vaseline and Band-Aid daily until healed.     Due to recent changes in healthcare laws, you may see results of your pathology and/or laboratory studies on MyChart before the doctors have had a chance to review them. We understand that in some cases there may be results that are confusing or concerning to you. Please understand that not all results are received at the same time and often the doctors may need to interpret multiple results in order to provide you with the best plan of care or course of treatment. Therefore, we ask that you please give us 2 business days to thoroughly review all your results before contacting the office for clarification. Should we see a critical lab result, you will be contacted sooner.   If You Need Anything After Your Visit  If you have any questions or concerns for your doctor, please call our main line at [phone] and press option 4 to reach your doctor's medical assistant. If no one answers, please leave a voicemail as directed and we will return your call as soon as possible. Messages left after 4 pm will be answered the following business day.   You may also send us a message via MyChart. We typically respond to MyChart messages within 1-2 business days.  For prescription refills, please ask your pharmacy to contact our office. Our fax number is [phone].  If you have an urgent issue when the clinic is closed that cannot wait until the next business day, you can page your doctor at the number below.    Please note that while we do our best to be available for urgent issues outside of office hours, we are not available 24/7.   If you have an urgent issue and are unable to reach us, you may choose to seek medical care at your doctor's office, retail clinic, urgent care center, or emergency room.  If you have a medical emergency, please immediately call 911 or go to the  emergency department.  Pager Numbers  - Dr. Kowalski: [phone]  - Dr. Moye: [phone]  - Dr. Stewart: [phone]  In the event of inclement weather, please call our main line at [phone] for an update on the status of any delays or closures.  Dermatology Medication Tips: Please keep the boxes that topical medications come in in order to help keep track of the instructions about where and how to use these. Pharmacies typically print the medication instructions only on the boxes and not directly on the medication tubes.   If your medication is too expensive, please contact our office at [phone] option 4 or send us a message through MyChart.   We are unable to tell what your co-pay for medications will be in advance as this is different depending on your insurance coverage. However, we may be able to find a substitute medication at lower cost or fill out paperwork to get insurance to cover a needed medication.   If a prior authorization is required to get your medication covered by your insurance company, please allow us 1-2 business days to complete this process.  Drug prices often vary depending on where the prescription is filled and some pharmacies may offer cheaper prices.  The website www.goodrx.com contains coupons for medications through different pharmacies. The prices here do not account for what the cost may be with help from insurance (it may be cheaper with your insurance), but the website can   give you the price if you did not use any insurance.  - You can print the associated coupon and take it with your prescription to the pharmacy.  - You may also stop by our office during regular business hours and pick up a GoodRx coupon card.  - If you need your prescription sent electronically to a different pharmacy, notify our office through [facility] MyChart or by phone at [phone] option 4.     Si Usted Necesita Algo Despus de Su Visita  Tambin puede  enviarnos un mensaje a travs de MyChart. Por lo general respondemos a los mensajes de MyChart en el transcurso de 1 a 2 das hbiles.  Para renovar recetas, por favor pida a su farmacia que se ponga en contacto con nuestra oficina. Nuestro nmero de fax es el [phone].  Si tiene un asunto urgente cuando la clnica est cerrada y que no puede esperar hasta el siguiente da hbil, puede llamar/localizar a su doctor(a) al nmero que aparece a continuacin.   Por favor, tenga en cuenta que aunque hacemos todo lo posible para estar disponibles para asuntos urgentes fuera del horario de oficina, no estamos disponibles las 24 horas del da, los 7 das de la semana.   Si tiene un problema urgente y no puede comunicarse con nosotros, puede optar por buscar atencin mdica  en el consultorio de su doctor(a), en una clnica privada, en un centro de atencin urgente o en una sala de emergencias.  Si tiene una emergencia mdica, por favor llame inmediatamente al 911 o vaya a la sala de emergencias.  Nmeros de bper  - Dr. Kowalski: [phone]  - Dra. Moye: [phone]  - Dra. Stewart: [phone]  En caso de inclemencias del tiempo, por favor llame a nuestra lnea principal al [phone] para una actualizacin sobre el estado de cualquier retraso o cierre.  Consejos para la medicacin en dermatologa: Por favor, guarde las cajas en las que vienen los medicamentos de uso tpico para ayudarle a seguir las instrucciones sobre dnde y cmo usarlos. Las farmacias generalmente imprimen las instrucciones del medicamento slo en las cajas y no directamente en los tubos del medicamento.   Si su medicamento es muy caro, por favor, pngase en contacto con nuestra oficina llamando al [phone] y presione la opcin 4 o envenos un mensaje a travs de MyChart.   No podemos decirle cul ser su copago por los medicamentos por adelantado ya que esto es diferente dependiendo de la cobertura de su seguro.  Sin embargo, es posible que podamos encontrar un medicamento sustituto a menor costo o llenar un formulario para que el seguro cubra el medicamento que se considera necesario.   Si se requiere una autorizacin previa para que su compaa de seguros cubra su medicamento, por favor permtanos de 1 a 2 das hbiles para completar este proceso.  Los precios de los medicamentos varan con frecuencia dependiendo del lugar de dnde se surte la receta y alguna farmacias pueden ofrecer precios ms baratos.  El sitio web www.goodrx.com tiene cupones para medicamentos de diferentes farmacias. Los precios aqu no tienen en cuenta lo que podra costar con la ayuda del seguro (puede ser ms barato con su seguro), pero el sitio web puede darle el precio si no utiliz ningn seguro.  - Puede imprimir el cupn correspondiente y llevarlo con su receta a la farmacia.  - Tambin puede pasar por nuestra oficina durante el horario de atencin regular y recoger una tarjeta de cupones de GoodRx.  -   Si necesita que su receta se enve electrnicamente a una farmacia diferente, informe a nuestra oficina a travs de MyChart de [facility] o por telfono llamando al [phone] y presione la opcin 4.  

## 2021-12-29 NOTE — Progress Notes (Unsigned)
   Follow-Up Visit   Subjective  Christopher [name] is a 88 y.o. male who presents for the following: Actinic Keratosis (R below L ear lobe, recheck) and hx of SCCs (R inf preauricular ant, R inf preauricular post).  Patient accompanied by daughter who contributes to history.  The following portions of the chart were reviewed this encounter and updated as appropriate:       Review of Systems:  No other skin or systemic complaints except as noted in HPI or Assessment and Plan.  Objective  Well appearing patient in no apparent distress; mood and affect are within normal limits.  A focused examination was performed including face, ears. Relevant physical exam findings are noted in the Assessment and Plan.  L infra auricular,  L neck, L ear, post neck, R post auricular, face x 21 (21) Pink scaly macules post neck, L ear, L neck, R post auricular, R face Residual scaly macule L infra auricular  post neck x 2 (2) Stuck on waxy paps with erythema  R inferior pre auricular anterior, R inferior preauricular posterior Well healed scars with no evidence of recurrence, no lymphadenopathy.     Assessment & Plan  AK (actinic keratosis) (21) L infra auricular,  L neck, L ear, post neck, R post auricular, face x 21  Destruction of lesion - L infra auricular,  L neck, L ear, post neck, R post auricular, face x 21 Complexity: simple   Destruction method: cryotherapy   Informed consent: discussed and consent obtained   Timeout:  patient name, date of birth, surgical site, and procedure verified Lesion destroyed using liquid nitrogen: Yes   Region frozen until ice ball extended beyond lesion: Yes   Outcome: patient tolerated procedure well with no complications   Post-procedure details: wound care instructions given    Inflamed seborrheic keratosis (2) post neck x 2  Symptomatic, irritating, patient would like treated.   Destruction of lesion - post neck x 2 Complexity: simple    Destruction method: cryotherapy   Informed consent: discussed and consent obtained   Timeout:  patient name, date of birth, surgical site, and procedure verified Lesion destroyed using liquid nitrogen: Yes   Region frozen until ice ball extended beyond lesion: Yes   Outcome: patient tolerated procedure well with no complications   Post-procedure details: wound care instructions given    History of SCC (squamous cell carcinoma) of skin R inferior pre auricular anterior, R inferior preauricular posterior  Clear. Observe for recurrence. Call clinic for new or changing lesions.  Recommend regular skin exams, daily broad-spectrum spf 30+ sunscreen use, and photoprotection.     Return in about 4 months (around 04/30/2022) for AK f/u, Hx of SCC , UBSE.  I, Darris Carachure, RMA, am acting as scribe for Sarina Ser, MD .

## 2021-12-30 ENCOUNTER — Encounter: Payer: Self-pay | Admitting: Dermatology

## 2022-01-20 ENCOUNTER — Other Ambulatory Visit: Payer: Self-pay | Admitting: Infectious Diseases

## 2022-01-20 DIAGNOSIS — C679 Malignant neoplasm of bladder, unspecified: Secondary | ICD-10-CM

## 2022-01-20 DIAGNOSIS — R1032 Left lower quadrant pain: Secondary | ICD-10-CM

## 2022-01-24 ENCOUNTER — Ambulatory Visit: Payer: Medicare HMO | Admitting: Internal Medicine

## 2022-01-24 ENCOUNTER — Ambulatory Visit: Payer: Medicare HMO

## 2022-01-24 ENCOUNTER — Ambulatory Visit
Admission: RE | Admit: 2022-01-24 | Discharge: 2022-01-24 | Disposition: A | Discharge status: Home / Self Care | Payer: Medicare HMO | Source: Ambulatory Visit | Attending: Infectious Diseases | Admitting: Infectious Diseases

## 2022-01-24 ENCOUNTER — Other Ambulatory Visit: Payer: Medicare HMO

## 2022-01-24 DIAGNOSIS — R1032 Left lower quadrant pain: Secondary | ICD-10-CM | POA: Diagnosis present

## 2022-01-24 DIAGNOSIS — C679 Malignant neoplasm of bladder, unspecified: Secondary | ICD-10-CM | POA: Diagnosis present

## 2022-01-24 LAB — POCT I-STAT CREATININE: Creatinine, Ser: 1 mg/dL (ref 0.61–1.24)

## 2022-01-24 MED ORDER — IOHEXOL 300 MG/ML  SOLN
100.0000 mL | Freq: Once | INTRAMUSCULAR | Status: AC | PRN
  Administered 2022-01-24: 100 mL via INTRAVENOUS

## 2022-01-27 ENCOUNTER — Telehealth: Payer: Self-pay | Admitting: Internal Medicine

## 2022-01-27 NOTE — Telephone Encounter (Signed)
Pt daughter called in to req PET scan for father, states that his lymph nodes are getting larger.

## 2022-01-27 NOTE — Telephone Encounter (Signed)
LVM for pt daughter with most recent communication in regards to appt req .Marland KitchenMarland KitchenKJ

## 2022-01-31 ENCOUNTER — Ambulatory Visit: Payer: Medicare HMO | Admitting: Dermatology

## 2022-02-08 ENCOUNTER — Inpatient Hospital Stay: Payer: Medicare HMO | Admitting: Internal Medicine

## 2022-02-08 ENCOUNTER — Encounter: Payer: Self-pay | Admitting: Internal Medicine

## 2022-02-08 ENCOUNTER — Inpatient Hospital Stay: Payer: Medicare HMO | Attending: Oncology

## 2022-02-08 VITALS — BP 123/63 | HR 61 | Temp 96.6°F | Ht 71.0 in | Wt 167.4 lb

## 2022-02-08 DIAGNOSIS — N400 Enlarged prostate without lower urinary tract symptoms: Secondary | ICD-10-CM | POA: Diagnosis not present

## 2022-02-08 DIAGNOSIS — C678 Malignant neoplasm of overlapping sites of bladder: Secondary | ICD-10-CM | POA: Insufficient documentation

## 2022-02-08 DIAGNOSIS — E785 Hyperlipidemia, unspecified: Secondary | ICD-10-CM | POA: Insufficient documentation

## 2022-02-08 DIAGNOSIS — K669 Disorder of peritoneum, unspecified: Secondary | ICD-10-CM

## 2022-02-08 DIAGNOSIS — K5900 Constipation, unspecified: Secondary | ICD-10-CM | POA: Insufficient documentation

## 2022-02-08 DIAGNOSIS — C7951 Secondary malignant neoplasm of bone: Secondary | ICD-10-CM | POA: Diagnosis not present

## 2022-02-08 DIAGNOSIS — D500 Iron deficiency anemia secondary to blood loss (chronic): Secondary | ICD-10-CM | POA: Diagnosis not present

## 2022-02-08 DIAGNOSIS — M545 Low back pain, unspecified: Secondary | ICD-10-CM | POA: Insufficient documentation

## 2022-02-08 DIAGNOSIS — Z923 Personal history of irradiation: Secondary | ICD-10-CM | POA: Insufficient documentation

## 2022-02-08 DIAGNOSIS — G893 Neoplasm related pain (acute) (chronic): Secondary | ICD-10-CM | POA: Insufficient documentation

## 2022-02-08 DIAGNOSIS — I119 Hypertensive heart disease without heart failure: Secondary | ICD-10-CM | POA: Diagnosis not present

## 2022-02-08 DIAGNOSIS — Z801 Family history of malignant neoplasm of trachea, bronchus and lung: Secondary | ICD-10-CM | POA: Insufficient documentation

## 2022-02-08 DIAGNOSIS — I700 Atherosclerosis of aorta: Secondary | ICD-10-CM | POA: Insufficient documentation

## 2022-02-08 DIAGNOSIS — C786 Secondary malignant neoplasm of retroperitoneum and peritoneum: Secondary | ICD-10-CM | POA: Insufficient documentation

## 2022-02-08 LAB — BASIC METABOLIC PANEL
Anion gap: 4 — ABNORMAL LOW (ref 5–15)
BUN: 18 mg/dL (ref 8–23)
CO2: 33 mmol/L — ABNORMAL HIGH (ref 22–32)
Calcium: 9.7 mg/dL (ref 8.9–10.3)
Chloride: 102 mmol/L (ref 98–111)
Creatinine, Ser: 1.02 mg/dL (ref 0.61–1.24)
GFR, Estimated: >60 mL/min (ref >60)
Glucose, Bld: 100 mg/dL — ABNORMAL HIGH (ref 70–99)
Potassium: 4.5 mmol/L (ref 3.5–5.1)
Sodium: 139 mmol/L (ref 135–145)

## 2022-02-08 LAB — CBC WITH DIFFERENTIAL/PLATELET
Abs Immature Granulocytes: 0.01 10*3/uL (ref 0.00–0.07)
Basophils Absolute: 0 10*3/uL (ref 0.0–0.1)
Basophils Relative: 0 %
Eosinophils Absolute: 0.2 10*3/uL (ref 0.0–0.5)
Eosinophils Relative: 4 %
HCT: 39 % (ref 39.0–52.0)
Hemoglobin: 12.8 g/dL — ABNORMAL LOW (ref 13.0–17.0)
Immature Granulocytes: 0 %
Lymphocytes Relative: 16 %
Lymphs Abs: 0.7 10*3/uL (ref 0.7–4.0)
MCH: 31.5 pg (ref 26.0–34.0)
MCHC: 32.8 g/dL (ref 30.0–36.0)
MCV: 96.1 fL (ref 80.0–100.0)
Monocytes Absolute: 0.8 10*3/uL (ref 0.1–1.0)
Monocytes Relative: 17 %
Neutro Abs: 2.9 10*3/uL (ref 1.7–7.7)
Neutrophils Relative %: 63 %
Platelets: 192 10*3/uL (ref 150–400)
RBC: 4.06 MIL/uL — ABNORMAL LOW (ref 4.22–5.81)
RDW: 14.8 % (ref 11.5–15.5)
WBC: 4.6 10*3/uL (ref 4.0–10.5)
nRBC: 0 % (ref 0.0–0.2)

## 2022-02-08 LAB — IRON AND TIBC
Iron: 64 ug/dL (ref 45–182)
Saturation Ratios: 23 % (ref 17.9–39.5)
TIBC: 284 ug/dL (ref 250–450)
UIBC: 220 ug/dL

## 2022-02-08 LAB — FERRITIN: Ferritin: 186 ng/mL (ref 24–336)

## 2022-02-08 NOTE — Progress Notes (Unsigned)
C/o stomach pain, constipation, pcp recommends he ask for PET scan. Had CT Scan 01/24/22.

## 2022-02-08 NOTE — Progress Notes (Unsigned)
[facility] NOTE  Patient Care Team: [name], MD as PCP - General (Infectious Diseases) Cammie Sickle, MD as Consulting Physician (Oncology)  CHIEF COMPLAINTS/PURPOSE OF CONSULTATION: Peritoneal thickening/IDA  Oncology History Overview Note  # NOV 2020- [incidental/hematuria work-up]-subtle peritoneal thickening/plaque-like lesions; March 2021-CT scan progressive omental/peritoneal thickening  #Microscopic hematuria- sec to BPH [cystoscopy/CT urogram negative; Dr.Stoiff]; March 2021 CT scan-new lesion in the bladder diverticulum  # MAY-June 2021- severe IDA sec to hemauria [hb 8.5; Ferritin- ]  #July 2022-hemorrhagic shock/bladder cancer-s/p radiation [facility]; no chemotherapy.  # on eliquis ? A.fib/pacemaker;   Urothelial carcinoma of bladder ([place])  10/29/2019 Initial Diagnosis   Urothelial carcinoma of bladder ([place])      HISTORY OF PRESENTING ILLNESS: pt ambulating with a rolling walker accompanied by his daughter  Christopher [name] 88 y.o.  male with a history of peritoneal/omental thickening of unclear etiology; also recurrent bladder malignancy/hematuria-currently s/p radiation at [facility].  Patient complains of abdominal pain for a scale of 10.  Intermittent.  However progress over the last 6 months to 1 year.  Intermittent constipation /alternating with diarrhea.    Patient denies any blood in stools.  Appetite is fair.  Positive for weight loss.  Review of Systems  Constitutional:  Negative for chills, diaphoresis, fever and weight loss.  HENT:  Negative for nosebleeds and sore throat.   Eyes:  Negative for double vision.  Respiratory:  Negative for cough, hemoptysis, sputum production and wheezing.   Cardiovascular:  Negative for chest pain, palpitations, orthopnea and leg swelling.  Gastrointestinal:  Positive for constipation. Negative for abdominal pain, blood in stool, diarrhea, heartburn, melena, nausea and vomiting.   Genitourinary:  Positive for hematuria. Negative for dysuria, frequency and urgency.  Musculoskeletal:  Negative for back pain and joint pain.  Skin: Negative.  Negative for itching and rash.  Neurological:  Negative for dizziness, tingling, focal weakness, weakness and headaches.  Endo/Heme/Allergies:  Does not bruise/bleed easily.  Psychiatric/Behavioral:  Negative for depression. The patient is not nervous/anxious and does not have insomnia.      MEDICAL HISTORY:  Past Medical History:  Diagnosis Date   A-fib ([place])    Anemia    Arthritis    BACK   Bladder cancer ([facility])    BPH (benign prostatic hyperplasia)    Cancer ([facility])    skin cancer on face carcinoma   ED (erectile dysfunction)    Rosanna Randy disease    HLD (hyperlipidemia)    Hypertension    Macular degeneration    Mollusca contagiosa    Osteopenia    Presence of permanent cardiac pacemaker    Sensorineural hearing loss    Squamous cell carcinoma of skin 08/25/2019   R cheek    Squamous cell carcinoma of skin 02/12/2020   L neck    Squamous cell carcinoma of skin 11/30/2020   Right elbow, [facility]   Squamous cell carcinoma of skin 03/16/2021   right base of thumb [facility]   Squamous cell carcinoma of skin 03/16/2021   right preauricular - MOHs 11/17/21   Squamous cell carcinoma of skin 10/05/2021   Right dorsum hand - EDC   Squamous cell carcinoma of skin 11/22/2021   right inf preauricular anterior, [facility]C   Squamous cell carcinoma of skin 11/22/2021   right inf preauricular posterior, EDC    SURGICAL HISTORY: Past Surgical History:  Procedure Laterality Date   CATARACT EXTRACTION Bilateral    CYSTOSCOPY WITH BIOPSY N/A 01/20/2020   Procedure: CYSTOSCOPY  WITH BIOPSY;  Surgeon: Abbie Sons, MD;  Location: [facility];  Service: Urology;  Laterality: N/A;   CYSTOSCOPY WITH FULGERATION N/A 01/20/2020   Procedure: CYSTOSCOPY WITH FULGERATION;  Surgeon: Abbie Sons, MD;  Location: [facility];  Service: Urology;   Laterality: N/A;   CYSTOSCOPY WITH FULGERATION N/A 04/19/2020   Procedure: Calvin with clot evacuation;  Surgeon: Abbie Sons, MD;  Location: [facility];  Service: Urology;  Laterality: N/A;   CYSTOSCOPY WITH FULGERATION N/A 01/05/2021   Procedure: CYSTOSCOPY WITH FULGERATION;  Surgeon: Abbie Sons, MD;  Location: [facility];  Service: Urology;  Laterality: N/A;   HEMORRHOIDECTOMY WITH HEMORRHOID BANDING     KNEE ARTHROSCOPY Left    PACEMAKER INSERTION N/A 03/03/2015   Procedure: INSERTION DUAL LEAD PACEMAKER;  Surgeon: Isaias Cowman, MD;  Location: [facility];  Service: Cardiovascular;  Laterality: N/A;   PULSE GENERATOR IMPLANT N/A 10/18/2020   Procedure: UNILATERAL PULSE GENERATOR IMPLANT;  Surgeon: [name], MD;  Location: [facility];  Service: Neurosurgery;  Laterality: N/A;   SPINAL CORD STIMULATOR TRIAL N/A 10/11/2020   Procedure: PERCUTANEOUS SPINAL CORD STIMULATOR TRIAL;  Surgeon: [name], MD;  Location: [facility];  Service: Neurosurgery;  Laterality: N/A;   THORACIC LAMINECTOMY FOR SPINAL CORD STIMULATOR N/A 10/18/2020   Procedure: PERCUTANEOUS LEAD PLACEMENT;  Surgeon: [name], MD;  Location: [facility];  Service: Neurosurgery;  Laterality: N/A;   TONSILLECTOMY     TRANSURETHRAL RESECTION OF BLADDER TUMOR N/A 10/07/2019   Procedure: TRANSURETHRAL RESECTION OF BLADDER TUMOR (TURBT);  Surgeon: Abbie Sons, MD;  Location: [facility];  Service: Urology;  Laterality: N/A;   TRANSURETHRAL RESECTION OF BLADDER TUMOR N/A 04/13/2020   Procedure: TRANSURETHRAL RESECTION OF BLADDER TUMOR (TURBT);  Surgeon: Abbie Sons, MD;  Location: [facility];  Service: Urology;  Laterality: N/A;    SOCIAL HISTORY: Social History   Socioeconomic History   Marital status: Widowed    Spouse name: Not on file   Number of children: Not on file   Years of education: Not on file   Highest education level: Not on file  Occupational History   Not on file  Tobacco Use    Smoking status: Never   Smokeless tobacco: Never  Vaping Use   Vaping Use: Never used  Substance and Sexual Activity   Alcohol use: Yes    Alcohol/week: 7.0 standard drinks of alcohol    Types: 7 Glasses of wine per week    Comment: DAILY    Drug use: No   Sexual activity: Yes    Birth control/protection: None  Other Topics Concern   Not on file  Social History Narrative   Retd. Civil engineer, contracting; [place]; self. Never smoked; alcohol- glass wine/drink every night.     Social Determinants of Health   Financial Resource Strain: Not on file  Food Insecurity: Not on file  Transportation Needs: Not on file  Physical Activity: Not on file  Stress: Not on file  Social Connections: Not on file  Intimate Partner Violence: Not on file    FAMILY HISTORY: Family History  Problem Relation Age of Onset   Diabetes Mother    Lung cancer Father    Cancer Paternal Uncle    Prostate cancer Neg Hx    Bladder Cancer Neg Hx    Kidney cancer Neg Hx     ALLERGIES:  is allergic to atenolol.  MEDICATIONS:  Current Outpatient Medications  Medication Sig Dispense Refill   alfuzosin (UROXATRAL) 10  MG 24 hr tablet Take 1 tablet (10 mg total) by mouth daily with breakfast. 90 tablet 3   atorvastatin (LIPITOR) 10 MG tablet Take 10 mg by mouth in the morning.     calcium carbonate (OSCAL) 1500 (600 Ca) MG TABS tablet Take 600 mg of elemental calcium by mouth in the morning.     Cholecalciferol (VITAMIN D3) 50 MCG (2000 UT) TABS Take 2,000 Units by mouth in the morning.     ferrous sulfate 325 (65 FE) MG tablet Take 325 mg by mouth in the morning and at bedtime.     finasteride (PROSCAR) 5 MG tablet TAKE 1 TABLET DAILY (Patient taking differently: Take 5 mg by mouth daily.) 90 tablet 3   Multiple Vitamin (MULTIVITAMIN WITH MINERALS) TABS tablet Take 1 tablet by mouth in the morning.     Multiple Vitamins-Minerals (PRESERVISION AREDS 2 PO) Take 1 tablet by mouth in the morning and at bedtime.      Omega-3 Fatty Acids (FISH OIL) 1000 MG CAPS Take 1,000 mg by mouth daily.     tamsulosin (FLOMAX) 0.4 MG CAPS capsule Take 0.4 mg by mouth daily.     diltiazem (CARDIZEM CD) 240 MG 24 hr capsule Take 1 capsule (240 mg total) by mouth daily. 30 capsule 0   metoprolol tartrate (LOPRESSOR) 50 MG tablet Take 1 tablet (50 mg total) by mouth 2 (two) times daily. 60 tablet 0   No current facility-administered medications for this visit.      Marland Kitchen  PHYSICAL EXAMINATION: ECOG PERFORMANCE STATUS: 0 - Asymptomatic  Vitals:   02/08/22 1507  BP: 123/63  Pulse: 61  Temp: (!) 96.6 F (35.9 C)  SpO2: 98%    Filed Weights   02/08/22 1507  Weight: 167 lb 6.4 oz (75.9 kg)     Physical Exam HENT:     Head: Normocephalic and atraumatic.     Mouth/Throat:     Pharynx: No oropharyngeal exudate.  Eyes:     Pupils: Pupils are equal, round, and reactive to light.  Cardiovascular:     Rate and Rhythm: Normal rate and regular rhythm.  Pulmonary:     Effort: No respiratory distress.     Breath sounds: No wheezing.  Abdominal:     General: Bowel sounds are normal. There is no distension.     Palpations: Abdomen is soft. There is no mass.     Tenderness: There is no abdominal tenderness. There is no guarding or rebound.  Musculoskeletal:        General: No tenderness. Normal range of motion.     Cervical back: Normal range of motion and neck supple.  Skin:    General: Skin is warm.  Neurological:     Mental Status: He is alert and oriented to person, place, and time.  Psychiatric:        Mood and Affect: Affect normal.       LABORATORY DATA:  I have reviewed the data as listed Lab Results  Component Value Date   WBC 4.6 02/08/2022   HGB 12.8 (L) 02/08/2022   HCT 39.0 02/08/2022   MCV 96.1 02/08/2022   PLT 192 02/08/2022   Recent Labs    03/30/21 1251 07/27/21 1036 01/24/22 1046 02/08/22 1503  NA 138 139  --  139  K 4.2 4.1  --  4.5  CL 104 102  --  102  CO2 27 29  --   33*  GLUCOSE 122* 102*  --  100*  BUN 20 15  --  18  CREATININE 1.08 0.91 1.00 1.02  CALCIUM 9.1 9.3  --  9.7  GFRNONAA >60 >60  --  >60    RADIOGRAPHIC STUDIES: I have personally reviewed the radiological images as listed and agreed with the findings in the report. CT ABDOMEN PELVIS W CONTRAST  Result Date: 01/25/2022 CLINICAL DATA:  History of bladder cancer. Worsening right-sided lower and mid abdominal pain times several months. * Tracking Code: BO *. EXAM: CT ABDOMEN AND PELVIS WITH CONTRAST TECHNIQUE: Multidetector CT imaging of the abdomen and pelvis was performed using the standard protocol following bolus administration of intravenous contrast. RADIATION DOSE REDUCTION: This exam was performed according to the departmental dose-optimization program which includes automated exposure control, adjustment of the mA and/or kV according to patient size and/or use of iterative reconstruction technique. CONTRAST:  170m OMNIPAQUE IOHEXOL 300 MG/ML  SOLN COMPARISON:  CT March 16, 2020. FINDINGS: Lower chest: No acute abnormality. Coronary artery calcifications. Partially visualized intracardiac leads. Symmetric distal esophageal wall thickening. Hepatobiliary: No suspicious hepatic lesion. Gallbladder is unremarkable. No biliary ductal dilation. Pancreas: No pancreatic ductal dilation or evidence of acute inflammation. Spleen: No splenomegaly or focal splenic lesion. Adrenals/Urinary Tract: Bilateral adrenal glands appear normal. No hydronephrosis. Kidneys demonstrate symmetric enhancement and excretion of contrast material. Mild irregular left-sided urinary bladder wall thickening of a minimally distended urinary bladder. Stone layering dependently in a left-sided urinary bladder diverticulum with urothelial hyperenhancement. Stomach/Bowel: Radiopaque enteric contrast material traverses the splenic flexure. Small hiatal hernia otherwise the stomach is unremarkable for degree of distension. No  pathologic dilation of small or large bowel. Moderate volume of formed stool throughout the colon suggestive of constipation. Left-sided colonic diverticulosis without findings of acute diverticulitis. Vascular/Lymphatic: Aortic atherosclerosis without aneurysmal dilation. Newly enlarged retroperitoneal lymph nodes/nodal tissue at the level of the renal hila measuring up to 13 mm in short axis on image 44/2. Reproductive: Dystrophic prostatic calcifications. Other: Trace abdominopelvic free fluid and mesenteric stranding. Similar vague appearing omental nodularity for instance on image 49/2. Musculoskeletal: Spinal stimulating generator in the right posterior gluteal subcutaneous soft tissues with leads extending into the thorax. Multilevel degenerative changes spine. Similar appearance of the T11 vertebral body compression deformity. Demineralization of bone. Bridging anterior osteophytes/syndesmophytes. IMPRESSION: 1. Mild irregular left-sided wall thickening of an incompletely distended urinary bladder. Stone layering dependently in the left-sided urinary bladder diverticulum with urothelial hyperenhancement of the diverticulum, possibly reflecting diverticulitis. Given poor urinary bladder distension and bladder wall irregularity consider further evaluation with cystoscopy. 2. New enlarged retroperitoneal lymph nodes/nodal tissue is nonspecific but would be concerning for nodal disease involvement in the setting of neoplasm. 3. Similar vague soft tissue nodularity in the omentum and trace abdominopelvic fluid/mesenteric stranding. 4. Symmetric distal esophageal wall thickening may reflect esophagitis. 5. Moderate volume of formed stool throughout the colon suggestive of constipation. 6. Colonic diverticulosis without findings of acute diverticulitis. 7.  Aortic Atherosclerosis (ICD10-I70.0). Electronically Signed   By: JDahlia BailiffM.D.   On: 01/25/2022 12:00    ASSESSMENT & PLAN:   Iron deficiency anemia  due to chronic blood loss #Iron deficiency anemia secondary to chronic blood loss/hematuria [see below]- S/p  IV Venofer; Today Hb-13.4.HOLD with venofer today.    # JUNE 2022- CT [facility]- Multiple recurrent high-grade-suspicious for muscle invasive urothelial malignancy; possible extravascular extension of the tumor [biopsy-[facility] negative for muscle invasion].  [facility]  S/p cystoscopy- NEG; awaiting repeat CT scan in sep 2023.  # Worsening abdominal  pain-4/10; worse with eating; on prn with tylenol;  with intermittent constipation on mirlax.  CT scan JULY 2023- . Mild irregular left-sided wall thickening of an incompletely distended urinary bladder. Stone layering dependently in the left-sided urinary bladder diverticulum with urothelial hyperenhancement of the diverticulum, possibly reflecting diverticulitis.  New enlarged retroperitoneal lymph nodes/nodal tissue-nonspecific/metastatic disease. Similar vague soft tissue nodularity in the omentum and trace abdominopelvic fluid/mesenteric stranding.  Recommend PET scan for further evaluation.  Consider tumor markers next visit/labs.    # Omental/peritoneal thickening-unclear etiology; follow-up CT June 2022 [[facility]]-negative for any progression.  See above..  # DISPOSITION: # PET scan ASAP # 1-2 days later; MD; no labs- venofer-Dr.B    All questions were answered. The patient knows to call the clinic with any problems, questions or concerns.    Cammie Sickle, MD 02/09/2022 4:45 PM

## 2022-02-08 NOTE — Assessment & Plan Note (Signed)
#  Iron deficiency anemia secondary to chronic blood loss/hematuria [see below]- S/p  IV Venofer; Today Hb-13.4.HOLD with venofer today.    # JUNE 2022- CT [facility]- Multiple recurrent high-grade-suspicious for muscle invasive urothelial malignancy; possible extravascular extension of the tumor [biopsy-[facility] negative for muscle invasion].  S/p  radiation at [facility].  S/p cystoscopy- NEG; awaiting repeat CT scan in sep 2023.  # Worsening abdominal pain-4/10; worse with eating; on prn with tylenol;  with intermittent constipation on mirlax.   IMPRESSION: 1. Mild irregular left-sided wall thickening of an incompletely distended urinary bladder. Stone layering dependently in the left-sided urinary bladder diverticulum with urothelial hyperenhancement of the diverticulum, possibly reflecting diverticulitis. Given poor urinary bladder distension and bladder wall irregularity consider further evaluation with cystoscopy. 2. New enlarged retroperitoneal lymph nodes/nodal tissue is nonspecific but would be concerning for nodal disease involvement in the setting of neoplasm. 3. Similar vague soft tissue nodularity in the omentum and trace abdominopelvic fluid/mesenteric stranding. 4. Symmetric distal esophageal wall thickening may reflect esophagitis. 5. Moderate volume of formed stool throughout the colon suggestive of constipation. 6. Colonic diverticulosis without findings of acute diverticulitis. 7.  Aortic Atherosclerosis (ICD10-I70.0).   Electronically Signed   By: Dahlia Bailiff M.D.   On: 01/25/2022 12:00   # A.fib- currently off eliquis [Dr.Fath]-given patient's recent hemorrhagic shock/July 2022; however, ok to -restart Eliquis on hematology [place]. No urinary bleeding.  Patient will reach out to urology/Dr. Ubaldo Glassing.  # Omental/peritoneal thickening-unclear etiology; follow-up CT June 2022 [[facility]]-negative for any progression.  Monitor for now.  # DISPOSITION: # PET scan ASAP # 1-2 days  later; MD; no labs- venofer-Dr.B

## 2022-02-09 ENCOUNTER — Encounter: Payer: Self-pay | Admitting: Internal Medicine

## 2022-02-16 ENCOUNTER — Ambulatory Visit
Admission: RE | Admit: 2022-02-16 | Discharge: 2022-02-16 | Disposition: A | Discharge status: Home / Self Care | Payer: Medicare HMO | Source: Ambulatory Visit | Attending: Internal Medicine | Admitting: Internal Medicine

## 2022-02-16 DIAGNOSIS — I700 Atherosclerosis of aorta: Secondary | ICD-10-CM | POA: Diagnosis not present

## 2022-02-16 DIAGNOSIS — K669 Disorder of peritoneum, unspecified: Secondary | ICD-10-CM | POA: Insufficient documentation

## 2022-02-16 DIAGNOSIS — I2510 Atherosclerotic heart disease of native coronary artery without angina pectoris: Secondary | ICD-10-CM | POA: Insufficient documentation

## 2022-02-16 DIAGNOSIS — N323 Diverticulum of bladder: Secondary | ICD-10-CM | POA: Insufficient documentation

## 2022-02-16 DIAGNOSIS — R911 Solitary pulmonary nodule: Secondary | ICD-10-CM | POA: Insufficient documentation

## 2022-02-16 DIAGNOSIS — C786 Secondary malignant neoplasm of retroperitoneum and peritoneum: Secondary | ICD-10-CM | POA: Insufficient documentation

## 2022-02-16 LAB — GLUCOSE, CAPILLARY: Glucose-Capillary: 119 mg/dL — ABNORMAL HIGH (ref 70–99)

## 2022-02-16 MED ORDER — FLUDEOXYGLUCOSE F - 18 (FDG) INJECTION
8.7000 | Freq: Once | INTRAVENOUS | Status: AC | PRN
  Administered 2022-02-16: 8.74 via INTRAVENOUS

## 2022-02-17 ENCOUNTER — Encounter: Payer: Self-pay | Admitting: Internal Medicine

## 2022-02-17 ENCOUNTER — Telehealth: Payer: Self-pay

## 2022-02-17 ENCOUNTER — Inpatient Hospital Stay: Payer: Medicare HMO | Admitting: Internal Medicine

## 2022-02-17 DIAGNOSIS — C678 Malignant neoplasm of overlapping sites of bladder: Secondary | ICD-10-CM | POA: Diagnosis not present

## 2022-02-17 MED ORDER — TRAMADOL HCL 50 MG PO TABS: 50.0000 mg | ORAL_TABLET | Freq: Two times a day (BID) | ORAL | 0 refills | Status: DC | PRN

## 2022-02-17 NOTE — Telephone Encounter (Signed)
ASAP Lymph node biopsy request faxed to specialty scheduling.

## 2022-02-17 NOTE — Progress Notes (Signed)
Christopher [name]  Patient Care Team: [name], MD as PCP - General (Infectious Diseases) Cammie Sickle, MD as Consulting Physician (Oncology)  CHIEF COMPLAINTS/PURPOSE OF CONSULTATION: Bladder cancer  Oncology History Overview [name]  # NOV 2020- [incidental/hematuria work-up]-subtle peritoneal thickening/plaque-like lesions; March 2021-CT scan progressive omental/peritoneal thickening  #Microscopic hematuria- sec to BPH [cystoscopy/CT urogram negative; Dr.Stoiff]; March 2021 CT scan-new lesion in the bladder diverticulum  # MAY-June 2021- severe IDA sec to hemauria [hb 8.5; Ferritin- ]  #July 2022-hemorrhagic shock/bladder cancer-s/p radiation [facility]; no chemotherapy.[Dr.Neilsen]  # AUG 11th, 2023- PET scan- Hypermetabolic metastatic osseous lesions, left supraclavicular lymph nodes, right upper lobe nodule and abdominal retroperitoneal adenopathy-  # on eliquis ? A.fib/pacemaker;   Urothelial carcinoma of bladder ([place])  10/29/2019 Initial Diagnosis   Urothelial carcinoma of bladder (HCC)   Cancer of overlapping sites of bladder ([place])  02/17/2022 Initial Diagnosis   Cancer of overlapping sites of bladder ([place])   02/17/2022 Cancer Staging   Staging form: Urinary Bladder, AJCC 8th Edition - Clinical: Stage Unknown (cTX, cN2, cM1) - Signed by Cammie Sickle, MD on 02/17/2022      HISTORY OF PRESENTING ILLNESS: pt ambulating with a rolling walker accompanied by his daughter  Christopher [name] 88 y.o.  male with a history of peritoneal/omental thickening of unclear etiology; also recurrent bladder malignancy/hematuria-currently s/p radiation at [facility] here to review the results of his PET scan.  Complains of right chest wall pain.  Progress weight loss.  Poor appetite.  Complains of low back pain.  Takes Tylenol for pain control.  No history of abdominal pain intermittently.  Constipation alternating with diarrhea.  Patient denies any blood  in stools.   Review of Systems  Constitutional:  Positive for malaise/fatigue and weight loss. Negative for chills, diaphoresis and fever.  HENT:  Negative for nosebleeds and sore throat.   Eyes:  Negative for double vision.  Respiratory:  Negative for cough, hemoptysis, sputum production and wheezing.   Cardiovascular:  Negative for chest pain, palpitations, orthopnea and leg swelling.  Gastrointestinal:  Positive for constipation. Negative for abdominal pain, blood in stool, diarrhea, heartburn, melena, nausea and vomiting.  Genitourinary:  Positive for hematuria. Negative for dysuria, frequency and urgency.  Musculoskeletal:  Positive for back pain and joint pain.  Skin: Negative.  Negative for itching and rash.  Neurological:  Negative for dizziness, tingling, focal weakness, weakness and headaches.  Endo/Heme/Allergies:  Does not bruise/bleed easily.  Psychiatric/Behavioral:  Negative for depression. The patient is not nervous/anxious and does not have insomnia.      MEDICAL HISTORY:  Past Medical History:  Diagnosis Date   A-fib ([place])    Anemia    Arthritis    BACK   Bladder cancer (HCC)    BPH (benign prostatic hyperplasia)    Cancer (HCC)    skin cancer on face carcinoma   ED (erectile dysfunction)    Rosanna Randy disease    HLD (hyperlipidemia)    Hypertension    Macular degeneration    Mollusca contagiosa    Osteopenia    Presence of permanent cardiac pacemaker    Sensorineural hearing loss    Squamous cell carcinoma of skin 08/25/2019   R cheek    Squamous cell carcinoma of skin 02/12/2020   L neck    Squamous cell carcinoma of skin 11/30/2020   Right elbow, [facility]   Squamous cell carcinoma of skin 03/16/2021   right base of thumb [facility]   Squamous  cell carcinoma of skin 03/16/2021   right preauricular - [facility] 11/17/21   Squamous cell carcinoma of skin 10/05/2021   Right dorsum hand - [facility]   Squamous cell carcinoma of skin 11/22/2021   right inf preauricular anterior,  [facility]   Squamous cell carcinoma of skin 11/22/2021   right inf preauricular posterior, [facility]    SURGICAL HISTORY: Past Surgical History:  Procedure Laterality Date   CATARACT EXTRACTION Bilateral    CYSTOSCOPY WITH BIOPSY N/A 01/20/2020   Procedure: CYSTOSCOPY WITH BIOPSY;  Surgeon: Abbie Sons, MD;  Location: [facility];  Service: Urology;  Laterality: N/A;   CYSTOSCOPY WITH FULGERATION N/A 01/20/2020   Procedure: CYSTOSCOPY WITH FULGERATION;  Surgeon: Abbie Sons, MD;  Location: [facility];  Service: Urology;  Laterality: N/A;   CYSTOSCOPY WITH FULGERATION N/A 04/19/2020   Procedure: [place] with clot evacuation;  Surgeon: Abbie Sons, MD;  Location: [facility];  Service: Urology;  Laterality: N/A;   CYSTOSCOPY WITH FULGERATION N/A 01/05/2021   Procedure: CYSTOSCOPY WITH FULGERATION;  Surgeon: Abbie Sons, MD;  Location: [facility];  Service: Urology;  Laterality: N/A;   HEMORRHOIDECTOMY WITH HEMORRHOID BANDING     KNEE ARTHROSCOPY Left    PACEMAKER INSERTION N/A 03/03/2015   Procedure: INSERTION DUAL LEAD PACEMAKER;  Surgeon: Isaias Cowman, MD;  Location: [facility];  Service: Cardiovascular;  Laterality: N/A;   PULSE GENERATOR IMPLANT N/A 10/18/2020   Procedure: UNILATERAL PULSE GENERATOR IMPLANT;  Surgeon: [name], MD;  Location: [facility];  Service: Neurosurgery;  Laterality: N/A;   SPINAL CORD STIMULATOR TRIAL N/A 10/11/2020   Procedure: PERCUTANEOUS SPINAL CORD STIMULATOR TRIAL;  Surgeon: [name], MD;  Location: [facility];  Service: Neurosurgery;  Laterality: N/A;   THORACIC LAMINECTOMY FOR SPINAL CORD STIMULATOR N/A 10/18/2020   Procedure: PERCUTANEOUS LEAD PLACEMENT;  Surgeon: [name], MD;  Location: [facility];  Service: Neurosurgery;  Laterality: N/A;   TONSILLECTOMY     TRANSURETHRAL RESECTION OF BLADDER TUMOR N/A 10/07/2019   Procedure: TRANSURETHRAL RESECTION OF BLADDER TUMOR (TURBT);  Surgeon: Abbie Sons, MD;  Location: [facility];   Service: Urology;  Laterality: N/A;   TRANSURETHRAL RESECTION OF BLADDER TUMOR N/A 04/13/2020   Procedure: TRANSURETHRAL RESECTION OF BLADDER TUMOR (TURBT);  Surgeon: Abbie Sons, MD;  Location: [facility];  Service: Urology;  Laterality: N/A;    SOCIAL HISTORY: Social History   Socioeconomic History   Marital status: Widowed    Spouse name: Not on file   Number of children: Not on file   Years of education: Not on file   Highest education level: Not on file  Occupational History   Not on file  Tobacco Use   Smoking status: Never   Smokeless tobacco: Never  Vaping Use   Vaping Use: Never used  Substance and Sexual Activity   Alcohol use: Yes    Alcohol/week: 7.0 standard drinks of alcohol    Types: 7 Glasses of wine per week    Comment: DAILY    Drug use: No   Sexual activity: Yes    Birth control/protection: None  Other Topics Concern   Not on file  Social History Narrative   Retd. Civil engineer, contracting; [place]; self. Never smoked; alcohol- glass wine/drink every night.     Social Determinants of Health   Financial Resource Strain: Not on file  Food Insecurity: Not on file  Transportation Needs: Not on file  Physical Activity: Not on file  Stress: Not on file  Social Connections:  Not on file  Intimate Partner Violence: Not on file    FAMILY HISTORY: Family History  Problem Relation Age of Onset   Diabetes Mother    Lung cancer Father    Cancer Paternal Uncle    Prostate cancer Neg Hx    Bladder Cancer Neg Hx    Kidney cancer Neg Hx     ALLERGIES:  is allergic to atenolol.  MEDICATIONS:  Current Outpatient Medications  Medication Sig Dispense Refill   alfuzosin (UROXATRAL) 10 MG 24 hr tablet Take 1 tablet (10 mg total) by mouth daily with breakfast. 90 tablet 3   atorvastatin (LIPITOR) 10 MG tablet Take 10 mg by mouth in the morning.     calcium carbonate (OSCAL) 1500 (600 Ca) MG TABS tablet Take 600 mg of elemental calcium by mouth in the morning.      Cholecalciferol (VITAMIN D3) 50 MCG (2000 UT) TABS Take 2,000 Units by mouth in the morning.     diltiazem (CARDIZEM CD) 240 MG 24 hr capsule Take 1 capsule (240 mg total) by mouth daily. 30 capsule 0   ferrous sulfate 325 (65 FE) MG tablet Take 325 mg by mouth in the morning and at bedtime.     finasteride (PROSCAR) 5 MG tablet TAKE 1 TABLET DAILY (Patient taking differently: Take 5 mg by mouth daily.) 90 tablet 3   metoprolol tartrate (LOPRESSOR) 50 MG tablet Take 1 tablet (50 mg total) by mouth 2 (two) times daily. 60 tablet 0   Multiple Vitamin (MULTIVITAMIN WITH MINERALS) TABS tablet Take 1 tablet by mouth in the morning.     Multiple Vitamins-Minerals (PRESERVISION AREDS 2 PO) Take 1 tablet by mouth in the morning and at bedtime.     Omega-3 Fatty Acids (FISH OIL) 1000 MG CAPS Take 1,000 mg by mouth daily.     tamsulosin (FLOMAX) 0.4 MG CAPS capsule Take 0.4 mg by mouth daily.     traMADol (ULTRAM) 50 MG tablet Take 1 tablet (50 mg total) by mouth every 12 (twelve) hours as needed. 45 tablet 0   No current facility-administered medications for this visit.      Marland Kitchen  PHYSICAL EXAMINATION: ECOG PERFORMANCE STATUS: 0 - Asymptomatic  Vitals:   02/17/22 1300  BP: (!) 95/52  Pulse: 60  Resp: 16  Temp: 97.9 F (36.6 C)  SpO2: 93%    Filed Weights   02/17/22 1300  Weight: 164 lb 12.8 oz (74.8 kg)     Physical Exam HENT:     Head: Normocephalic and atraumatic.     Mouth/Throat:     Pharynx: No oropharyngeal exudate.  Eyes:     Pupils: Pupils are equal, round, and reactive to light.  Cardiovascular:     Rate and Rhythm: Normal rate and regular rhythm.  Pulmonary:     Effort: No respiratory distress.     Breath sounds: No wheezing.  Abdominal:     General: Bowel sounds are normal. There is no distension.     Palpations: Abdomen is soft. There is no mass.     Tenderness: There is no abdominal tenderness. There is no guarding or rebound.  Musculoskeletal:        General: No  tenderness. Normal range of motion.     Cervical back: Normal range of motion and neck supple.  Skin:    General: Skin is warm.  Neurological:     Mental Status: He is alert and oriented to person, place, and time.  Psychiatric:  Mood and Affect: Affect normal.       LABORATORY DATA:  I have reviewed the data as listed Lab Results  Component Value Date   WBC 4.6 02/08/2022   HGB 12.8 (L) 02/08/2022   HCT 39.0 02/08/2022   MCV 96.1 02/08/2022   PLT 192 02/08/2022   Recent Labs    03/30/21 1251 07/27/21 1036 01/24/22 1046 02/08/22 1503  NA 138 139  --  139  K 4.2 4.1  --  4.5  CL 104 102  --  102  CO2 27 29  --  33*  GLUCOSE 122* 102*  --  100*  BUN 20 15  --  18  CREATININE 1.08 0.91 1.00 1.02  CALCIUM 9.1 9.3  --  9.7  GFRNONAA >60 >60  --  >60    RADIOGRAPHIC STUDIES: I have personally reviewed the radiological images as listed and agreed with the findings in the report. NM PET Image Restag (PS) Skull Base To Thigh  Result Date: 02/16/2022 CLINICAL DATA:  Initial treatment strategy for peritoneal carcinomatosis. EXAM: NUCLEAR MEDICINE PET SKULL BASE TO THIGH TECHNIQUE: 8.7 mCi F-18 FDG was injected intravenously. Full-ring PET imaging was performed from the skull base to thigh after the radiotracer. CT data was obtained and used for attenuation correction and anatomic localization. Fasting blood glucose: 119 mg/dl COMPARISON:  CT abdomen pelvis 01/24/2022. FINDINGS: Mediastinal blood pool activity: SUV max 1.7 Liver activity: SUV max NA NECK: No hypermetabolic adenopathy. Incidental CT findings: None. CHEST: Hypermetabolic left supraclavicular lymph nodes measure up to 7 mm (2/72), SUV max 5.8. 8 mm peripheral right upper lobe nodule (2/85), SUV max 3.9. Possible hypermetabolic right hilar lymph node, SUV max 2.7. Comparison with CT is challenging without IV contrast. No hypermetabolic mediastinal or axillary lymph nodes. Incidental CT findings: Atherosclerotic  calcification of the aorta, aortic valve and coronary arteries. Heart is enlarged. No pericardial or pleural effusion. There may be mild coarsening of the pulmonary markings. ABDOMEN/PELVIS: Periaortic lymph nodes measure up to 1.3 cm in the left periaortic station (2/156), SUV max 5.5. No abnormal hypermetabolism in the liver, adrenal glands, spleen or pancreas. No abnormal peritoneal hypermetabolism. Incidental CT findings: Liver, gallbladder, adrenal glands, kidneys, spleen, pancreas, stomach and bowel are grossly unremarkable. Atherosclerotic calcification of the aorta. Left posterolateral bladder diverticulum contains a tiny stone. Mild perivesical haziness. Previously questioned omental thickening is not well visualized. SKELETON: Multiple hypermetabolic lesions are seen in the visualized osseous structures. Index hypermetabolism in the L1 vertebral body, SUV max 10.3, corresponds to a 1.7 cm lytic lesion. Incidental CT findings: Degenerative changes in the spine. Spinal stimulator wires in place. IMPRESSION: 1. Hypermetabolic metastatic osseous lesions, left supraclavicular lymph nodes, right upper lobe nodule and abdominal retroperitoneal adenopathy, possibly related to the patient's known diagnosis of bladder cancer. Melanoma not excluded. 2. Omental thickening seen on 01/24/2022 is no longer appreciated. 3. Left posterolateral bladder diverticulum with a tiny stone. Mild perivesical haziness, possibly treatment related or due to cystitis. 4. Aortic atherosclerosis (ICD10-I70.0). Coronary artery calcification. Electronically Signed   By: Lorin Picket M.D.   On: 02/16/2022 14:32   CT ABDOMEN PELVIS W CONTRAST  Result Date: 01/25/2022 CLINICAL DATA:  History of bladder cancer. Worsening right-sided lower and mid abdominal pain times several months. * Tracking Code: BO *. EXAM: CT ABDOMEN AND PELVIS WITH CONTRAST TECHNIQUE: Multidetector CT imaging of the abdomen and pelvis was performed using the  standard protocol following bolus administration of intravenous contrast. RADIATION DOSE REDUCTION: This exam was  performed according to the departmental dose-optimization program which includes automated exposure control, adjustment of the mA and/or kV according to patient size and/or use of iterative reconstruction technique. CONTRAST:  113m OMNIPAQUE IOHEXOL 300 MG/ML  SOLN COMPARISON:  CT March 16, 2020. FINDINGS: Lower chest: No acute abnormality. Coronary artery calcifications. Partially visualized intracardiac leads. Symmetric distal esophageal wall thickening. Hepatobiliary: No suspicious hepatic lesion. Gallbladder is unremarkable. No biliary ductal dilation. Pancreas: No pancreatic ductal dilation or evidence of acute inflammation. Spleen: No splenomegaly or focal splenic lesion. Adrenals/Urinary Tract: Bilateral adrenal glands appear normal. No hydronephrosis. Kidneys demonstrate symmetric enhancement and excretion of contrast material. Mild irregular left-sided urinary bladder wall thickening of a minimally distended urinary bladder. Stone layering dependently in a left-sided urinary bladder diverticulum with urothelial hyperenhancement. Stomach/Bowel: Radiopaque enteric contrast material traverses the splenic flexure. Small hiatal hernia otherwise the stomach is unremarkable for degree of distension. No pathologic dilation of small or large bowel. Moderate volume of formed stool throughout the colon suggestive of constipation. Left-sided colonic diverticulosis without findings of acute diverticulitis. Vascular/Lymphatic: Aortic atherosclerosis without aneurysmal dilation. Newly enlarged retroperitoneal lymph nodes/nodal tissue at the level of the renal hila measuring up to 13 mm in short axis on image 44/2. Reproductive: Dystrophic prostatic calcifications. Other: Trace abdominopelvic free fluid and mesenteric stranding. Similar vague appearing omental nodularity for instance on image 49/2.  Musculoskeletal: Spinal stimulating generator in the right posterior gluteal subcutaneous soft tissues with leads extending into the thorax. Multilevel degenerative changes spine. Similar appearance of the T11 vertebral body compression deformity. Demineralization of bone. Bridging anterior osteophytes/syndesmophytes. IMPRESSION: 1. Mild irregular left-sided wall thickening of an incompletely distended urinary bladder. Stone layering dependently in the left-sided urinary bladder diverticulum with urothelial hyperenhancement of the diverticulum, possibly reflecting diverticulitis. Given poor urinary bladder distension and bladder wall irregularity consider further evaluation with cystoscopy. 2. New enlarged retroperitoneal lymph nodes/nodal tissue is nonspecific but would be concerning for nodal disease involvement in the setting of neoplasm. 3. Similar vague soft tissue nodularity in the omentum and trace abdominopelvic fluid/mesenteric stranding. 4. Symmetric distal esophageal wall thickening may reflect esophagitis. 5. Moderate volume of formed stool throughout the colon suggestive of constipation. 6. Colonic diverticulosis without findings of acute diverticulitis. 7.  Aortic Atherosclerosis (ICD10-I70.0). Electronically Signed   By: JDahlia BailiffM.D.   On: 01/25/2022 12:00    ASSESSMENT & PLAN:   Cancer of overlapping sites of bladder ([facility]  JUNE 2022- CT [facility]- Multiple recurrent high-grade-suspicious for muscle invasive urothelial malignancy; possible extravascular extension of the tumor [biopsy-[facility] negative for muscle invasion].  [facility]  S/p cystoscopy- NEG. AUG 11th, 2023- PET scan- Hypermetabolic metastatic osseous lesions, left supraclavicular lymph nodes, right upper lobe nodule and abdominal retroperitoneal adenopathy-highly concerning for malignancy.  Recommend a biopsy for further evaluation/treatment plan.  [facility]  [facility].  #Discussed with the daughters that given the multiple "metastatic lesions"-suggestive of stage IV disease.  Recommend systemic therapy-recommended therapy if candidate.  I do not think patient given his age a candidate for any systemic chemotherapy.   # Lumbar back pain-likely secondary to metastatic lesions-recommend evaluation with radiation oncology for palliative radiation.  Also recommend tramadol for ongoing pain-50 mg every 12 hours as needed.   #Iron deficiency anemia secondary to chronic blood loss/hematuria [see below]- S/p  IV Venofer; Today Hb-12; July 2023 iron studies-no significant deficiency.  HOLD venofer for now.   # Worsening  abdominal pain-4/10; worse with eating; on prn with tylenol;  with intermittent constipation on mirlax.  Unclear etiology.  No obvious evidence of malignancy causing the pain.  # Omental/peritoneal thickening-unclear etiology; follow-up CT June 2022 [[facility]]-negative for any progression; August 2023 PET scan-no omental disease.  # DISPOSITION: # Lymph node Bx- ASAP # referral to Dr.Chrysal re: pain control ASAP # follow up in ~ 2 week-- MD; labs- cbc/cmp;-Dr.B  # I reviewed the blood work- with the patient in detail; also reviewed the imaging independently [as summarized above]; and with the patient in detail.   # 40 minutes face-to-face with the patient discussing the above plan of care; more than 50% of time spent on prognosis/ natural history; counseling and coordination.      All questions were answered. The patient knows to call the clinic with any problems, questions or concerns.    Cammie Sickle, MD 02/17/2022 5:58 PM

## 2022-02-17 NOTE — Progress Notes (Signed)
Patient reports no appetite with a 3 lb wt loss.  Worsening low abdominal pain that is improved with Tylenol.  Takes Tylenol 500 mg 2 tabs PRN, usually scheduled in morning and evening with additional dose mid day, with last dose this morning around 10:00.  Exertional SOBr, O2 93% in office check.  Episodes of right side low rib pain.  BP in office today 95/52, HR 60

## 2022-02-17 NOTE — Assessment & Plan Note (Addendum)
JUNE 2022- CT [facility]- Multiple recurrent high-grade-suspicious for muscle invasive urothelial malignancy; possible extravascular extension of the tumor [biopsy-[facility] negative for muscle invasion].  S/p  radiation at [facility].  S/p cystoscopy- NEG. AUG 11th, 2023- PET scan- Hypermetabolic metastatic osseous lesions, left supraclavicular lymph nodes, right upper lobe nodule and abdominal retroperitoneal adenopathy-highly concerning for malignancy.  Recommend a biopsy for further evaluation/treatment plan.  I have reached out to patient's Dr. Ricky Ala at [facility] urologist regarding an update plan of care.  #Discussed with the daughters that given the multiple "metastatic lesions"-suggestive of stage IV disease.  Recommend systemic therapy-recommended therapy if candidate.  I do not think patient given his age a candidate for any systemic chemotherapy.   # Lumbar back pain-likely secondary to metastatic lesions-recommend evaluation with radiation oncology for palliative radiation.  Also recommend tramadol for ongoing pain-50 mg every 12 hours as needed.   #Iron deficiency anemia secondary to chronic blood loss/hematuria [see below]- S/p  IV Venofer; Today Hb-12; July 2023 iron studies-no significant deficiency.  HOLD venofer for now.   # Worsening abdominal pain-4/10; worse with eating; on prn with tylenol;  with intermittent constipation on mirlax.  Unclear etiology.  No obvious evidence of malignancy causing the pain.  # Omental/peritoneal thickening-unclear etiology; follow-up CT June 2022 [[facility]]-negative for any progression; August 2023 PET scan-no omental disease.  # DISPOSITION: # Lymph node Bx- ASAP # referral to Dr.Chrysal re: pain control ASAP # follow up in ~ 2 week-- MD; labs- cbc/cmp;-Dr.B  # I reviewed the blood work- with the patient in detail; also reviewed the imaging independently [as summarized above]; and with the patient in detail.   # 40 minutes face-to-face with the patient discussing the  above plan of care; more than 50% of time spent on prognosis/ natural history; counseling and coordination.  Addendum: Discussed with Dr. Ricky Ala, [facility] of the above plan.  In agreement.

## 2022-02-20 ENCOUNTER — Encounter: Payer: Self-pay | Admitting: Internal Medicine

## 2022-02-20 NOTE — Telephone Encounter (Signed)
Appt 02/23/22 at 1:30 pm arrvial for 2 pm appt. Daughter confirmed.

## 2022-02-21 ENCOUNTER — Other Ambulatory Visit: Payer: Self-pay | Admitting: Nurse Practitioner

## 2022-02-21 ENCOUNTER — Telehealth: Payer: Self-pay | Admitting: Internal Medicine

## 2022-02-21 ENCOUNTER — Other Ambulatory Visit: Payer: Self-pay | Admitting: Internal Medicine

## 2022-02-21 MED ORDER — HYDROCODONE-ACETAMINOPHEN 5-325 MG PO TABS: 1.0000 | ORAL_TABLET | Freq: Three times a day (TID) | ORAL | 0 refills | Status: DC | PRN

## 2022-02-21 MED ORDER — PREDNISONE 20 MG PO TABS: ORAL_TABLET | ORAL | 0 refills | Status: DC

## 2022-02-21 NOTE — Progress Notes (Signed)
Unable to send narcotic prescription d/t imprivata error.

## 2022-02-21 NOTE — Telephone Encounter (Signed)
Called in a prescription for steroids/hydrocodone given his ongoing pain.  Pulmonary Gelcosal tomorrow- 8/16. GB

## 2022-02-22 ENCOUNTER — Inpatient Hospital Stay: Payer: Medicare HMO

## 2022-02-22 ENCOUNTER — Encounter: Payer: Self-pay | Admitting: Radiation Oncology

## 2022-02-22 ENCOUNTER — Inpatient Hospital Stay (HOSPITAL_BASED_OUTPATIENT_CLINIC_OR_DEPARTMENT_OTHER): Payer: Medicare HMO | Admitting: Hospice and Palliative Medicine

## 2022-02-22 ENCOUNTER — Other Ambulatory Visit: Payer: Self-pay

## 2022-02-22 ENCOUNTER — Ambulatory Visit
Admission: RE | Admit: 2022-02-22 | Discharge: 2022-02-22 | Disposition: A | Discharge status: Home / Self Care | Payer: Medicare HMO | Source: Ambulatory Visit | Attending: Radiation Oncology | Admitting: Radiation Oncology

## 2022-02-22 VITALS — BP 107/57 | HR 60 | Temp 97.4°F | Resp 16 | Ht 71.0 in | Wt 164.0 lb

## 2022-02-22 VITALS — BP 159/66

## 2022-02-22 VITALS — BP 83/46 | HR 60 | Temp 97.0°F | Resp 18

## 2022-02-22 DIAGNOSIS — Z79899 Other long term (current) drug therapy: Secondary | ICD-10-CM | POA: Insufficient documentation

## 2022-02-22 DIAGNOSIS — R109 Unspecified abdominal pain: Secondary | ICD-10-CM | POA: Diagnosis not present

## 2022-02-22 DIAGNOSIS — Z801 Family history of malignant neoplasm of trachea, bronchus and lung: Secondary | ICD-10-CM | POA: Insufficient documentation

## 2022-02-22 DIAGNOSIS — R262 Difficulty in walking, not elsewhere classified: Secondary | ICD-10-CM | POA: Diagnosis not present

## 2022-02-22 DIAGNOSIS — G893 Neoplasm related pain (acute) (chronic): Secondary | ICD-10-CM

## 2022-02-22 DIAGNOSIS — R319 Hematuria, unspecified: Secondary | ICD-10-CM

## 2022-02-22 DIAGNOSIS — C7951 Secondary malignant neoplasm of bone: Secondary | ICD-10-CM | POA: Diagnosis present

## 2022-02-22 DIAGNOSIS — N400 Enlarged prostate without lower urinary tract symptoms: Secondary | ICD-10-CM | POA: Insufficient documentation

## 2022-02-22 DIAGNOSIS — C678 Malignant neoplasm of overlapping sites of bladder: Secondary | ICD-10-CM

## 2022-02-22 DIAGNOSIS — N529 Male erectile dysfunction, unspecified: Secondary | ICD-10-CM | POA: Insufficient documentation

## 2022-02-22 DIAGNOSIS — I10 Essential (primary) hypertension: Secondary | ICD-10-CM | POA: Insufficient documentation

## 2022-02-22 DIAGNOSIS — Z809 Family history of malignant neoplasm, unspecified: Secondary | ICD-10-CM | POA: Diagnosis not present

## 2022-02-22 DIAGNOSIS — C679 Malignant neoplasm of bladder, unspecified: Secondary | ICD-10-CM | POA: Diagnosis not present

## 2022-02-22 DIAGNOSIS — Z85828 Personal history of other malignant neoplasm of skin: Secondary | ICD-10-CM | POA: Insufficient documentation

## 2022-02-22 DIAGNOSIS — Z7952 Long term (current) use of systemic steroids: Secondary | ICD-10-CM | POA: Diagnosis not present

## 2022-02-22 DIAGNOSIS — E785 Hyperlipidemia, unspecified: Secondary | ICD-10-CM | POA: Diagnosis not present

## 2022-02-22 DIAGNOSIS — Z923 Personal history of irradiation: Secondary | ICD-10-CM | POA: Insufficient documentation

## 2022-02-22 DIAGNOSIS — I4891 Unspecified atrial fibrillation: Secondary | ICD-10-CM | POA: Insufficient documentation

## 2022-02-22 LAB — CBC WITH DIFFERENTIAL/PLATELET
Abs Immature Granulocytes: 0.02 10*3/uL (ref 0.00–0.07)
Basophils Absolute: 0 10*3/uL (ref 0.0–0.1)
Basophils Relative: 0 %
Eosinophils Absolute: 0.1 10*3/uL (ref 0.0–0.5)
Eosinophils Relative: 1 %
HCT: 37.2 % — ABNORMAL LOW (ref 39.0–52.0)
Hemoglobin: 12.2 g/dL — ABNORMAL LOW (ref 13.0–17.0)
Immature Granulocytes: 0 %
Lymphocytes Relative: 11 %
Lymphs Abs: 0.7 10*3/uL (ref 0.7–4.0)
MCH: 31.7 pg (ref 26.0–34.0)
MCHC: 32.8 g/dL (ref 30.0–36.0)
MCV: 96.6 fL (ref 80.0–100.0)
Monocytes Absolute: 0.9 10*3/uL (ref 0.1–1.0)
Monocytes Relative: 13 %
Neutro Abs: 5 10*3/uL (ref 1.7–7.7)
Neutrophils Relative %: 75 %
Platelets: 242 10*3/uL (ref 150–400)
RBC: 3.85 MIL/uL — ABNORMAL LOW (ref 4.22–5.81)
RDW: 15.8 % — ABNORMAL HIGH (ref 11.5–15.5)
WBC: 6.8 10*3/uL (ref 4.0–10.5)
nRBC: 0 % (ref 0.0–0.2)

## 2022-02-22 LAB — URINALYSIS, COMPLETE (UACMP) WITH MICROSCOPIC
Bilirubin Urine: NEGATIVE
Glucose, UA: NEGATIVE mg/dL
Ketones, ur: NEGATIVE mg/dL
Nitrite: NEGATIVE
Protein, ur: 30 mg/dL — AB
RBC / HPF: >50 RBC/hpf — ABNORMAL HIGH (ref 0–5)
Specific Gravity, Urine: 1.021 (ref 1.005–1.030)
pH: 5 (ref 5.0–8.0)

## 2022-02-22 LAB — COMPREHENSIVE METABOLIC PANEL
ALT: 17 U/L (ref 0–44)
AST: 18 U/L (ref 15–41)
Albumin: 3.3 g/dL — ABNORMAL LOW (ref 3.5–5.0)
Alkaline Phosphatase: 79 U/L (ref 38–126)
Anion gap: 6 (ref 5–15)
BUN: 21 mg/dL (ref 8–23)
CO2: 31 mmol/L (ref 22–32)
Calcium: 9.7 mg/dL (ref 8.9–10.3)
Chloride: 100 mmol/L (ref 98–111)
Creatinine, Ser: 0.84 mg/dL (ref 0.61–1.24)
GFR, Estimated: >60 mL/min (ref >60)
Glucose, Bld: 111 mg/dL — ABNORMAL HIGH (ref 70–99)
Potassium: 4 mmol/L (ref 3.5–5.1)
Sodium: 137 mmol/L (ref 135–145)
Total Bilirubin: 1.2 mg/dL (ref 0.3–1.2)
Total Protein: 7.3 g/dL (ref 6.5–8.1)

## 2022-02-22 MED ORDER — HYDROCODONE-ACETAMINOPHEN 5-325 MG PO TABS
1.0000 | ORAL_TABLET | Freq: Four times a day (QID) | ORAL | 0 refills | Status: DC | PRN
Start: 2022-02-22 — End: 2022-03-16

## 2022-02-22 MED ORDER — SODIUM CHLORIDE 0.9 % IV SOLN
INTRAVENOUS | Status: DC
  Filled 2022-02-22: qty 250

## 2022-02-22 NOTE — Progress Notes (Signed)
Symptom Management [place] at [facility] [phone] [phone]  Patient Care Team: [name], MD as PCP - General (Infectious Diseases) Cammie Sickle, MD as Consulting Physician (Oncology)   NAME OF PATIENT: Christopher [name]  [id number]  [birth year]   DATE OF VISIT: 02/22/22  REASON FOR CONSULT: Christopher [name] is a 88 y.o. male with multiple medical problems including history of peritoneal/omental thickening of unclear etiology also with recurrent high-grade bladder cancer status post XRT at [facility].  PET scan February 17, 2022 revealed hypermetabolic metastatic osseous lesions, left supraclavicular lymph node involvement, right upper lobe, and hypermetabolic abdominal retroperitoneal adenopathy.  Patient was not felt to be a candidate for systemic chemotherapy given his age.  He had persistent lumbar back pain likely secondary to metastatic lesions.  He was referred to XRT for that.  INTERVAL HISTORY: Patient was requested to be added on today for evaluation in [facility] after seeing rad onc.  Patient was complaining of dizziness and weakness.  Patient reports a recent fall as he became dizzy while trying to stand and ambulate to the restroom.  He has been more weak and is now using a walker to ambulate.  He denies sustaining any injury related to falling.  Appetite is "fair".  Denies any neurologic complaints. Denies recent fevers or illnesses. Denies any easy bleeding or bruising.  Denies chest pain. Denies any nausea, vomiting, area.  Has had constipation.  Denies urinary complaints. Patient offers no further specific complaints today.  PAST MEDICAL HISTORY: Past Medical History:  Diagnosis Date   A-fib ([place])    Anemia    Arthritis    BACK   Bladder cancer (HCC)    BPH (benign prostatic hyperplasia)    Cancer (HCC)    skin cancer on face carcinoma   ED (erectile dysfunction)    Rosanna Randy disease    HLD  (hyperlipidemia)    Hypertension    Macular degeneration    Mollusca contagiosa    Osteopenia    Presence of permanent cardiac pacemaker    Sensorineural hearing loss    Squamous cell carcinoma of skin 08/25/2019   R cheek    Squamous cell carcinoma of skin 02/12/2020   L neck    Squamous cell carcinoma of skin 11/30/2020   Right elbow, [facility]   Squamous cell carcinoma of skin 03/16/2021   right base of thumb [facility]   Squamous cell carcinoma of skin 03/16/2021   right preauricular - [facility] 11/17/21   Squamous cell carcinoma of skin 10/05/2021   Right dorsum hand - [facility]   Squamous cell carcinoma of skin 11/22/2021   right inf preauricular anterior, [facility]   Squamous cell carcinoma of skin 11/22/2021   right inf preauricular posterior, [facility]    PAST SURGICAL HISTORY:  Past Surgical History:  Procedure Laterality Date   CATARACT EXTRACTION Bilateral    CYSTOSCOPY WITH BIOPSY N/A 01/20/2020   Procedure: CYSTOSCOPY WITH BIOPSY;  Surgeon: Abbie Sons, MD;  Location: [facility] ORS;  Service: Urology;  Laterality: N/A;   CYSTOSCOPY WITH FULGERATION N/A 01/20/2020   Procedure: CYSTOSCOPY WITH FULGERATION;  Surgeon: Abbie Sons, MD;  Location: [facility] OR[facility];  Service: Urology;  Laterality: N/A;   CYSTOSCOPY WITH FULGERATION N/A 04/19/2020   Procedure: [place] with clot evacuation;  Surgeon: Abbie Sons, MD;  Location: [facility] OR[facility];  Service: Urology;  Laterality: N/A;   CYSTOSCOPY WITH FULGERATION N/A 01/05/2021   Procedure: CYSTOSCOPY WITH FULGERATION;  Surgeon:  Stoioff, Ronda Fairly, MD;  Location: [facility] OR[facility];  Service: Urology;  Laterality: N/A;   HEMORRHOIDECTOMY WITH HEMORRHOID BANDING     KNEE ARTHROSCOPY Left    PACEMAKER INSERTION N/A 03/03/2015   Procedure: INSERTION DUAL LEAD PACEMAKER;  Surgeon: Isaias Cowman, MD;  Location: [facility] OR[facility];  Service: Cardiovascular;  Laterality: N/A;   PULSE GENERATOR IMPLANT N/A 10/18/2020   Procedure: UNILATERAL PULSE GENERATOR IMPLANT;   Surgeon: [name], MD;  Location: [facility] OR[facility];  Service: Neurosurgery;  Laterality: N/A;   SPINAL CORD STIMULATOR TRIAL N/A 10/11/2020   Procedure: PERCUTANEOUS SPINAL CORD STIMULATOR TRIAL;  Surgeon: [name], MD;  Location: [facility];  Service: Neurosurgery;  Laterality: N/A;   THORACIC LAMINECTOMY FOR SPINAL CORD STIMULATOR N/A 10/18/2020   Procedure: PERCUTANEOUS LEAD PLACEMENT;  Surgeon: [name], MD;  Location: [facility];  Service: Neurosurgery;  Laterality: N/A;   TONSILLECTOMY     TRANSURETHRAL RESECTION OF BLADDER TUMOR N/A 10/07/2019   Procedure: TRANSURETHRAL RESECTION OF BLADDER TUMOR (TURBT);  Surgeon: Abbie Sons, MD;  Location: [facility];  Service: Urology;  Laterality: N/A;   TRANSURETHRAL RESECTION OF BLADDER TUMOR N/A 04/13/2020   Procedure: TRANSURETHRAL RESECTION OF BLADDER TUMOR (TURBT);  Surgeon: Abbie Sons, MD;  Location: [facility] OR[facility];  Service: Urology;  Laterality: N/A;    HEMATOLOGY/ONCOLOGY HISTORY:  Oncology History Overview Note  # NOV 2020- [incidental/hematuria work-up]-subtle peritoneal thickening/plaque-like lesions; March 2021-CT scan progressive omental/peritoneal thickening  #Microscopic hematuria- sec to BPH [cystoscopy/CT urogram negative; Dr.Stoiff]; March 2021 CT scan-new lesion in the bladder diverticulum  # MAY-June 2021- severe IDA sec to hemauria [hb 8.5; Ferritin- ]  #July 2022-hemorrhagic shock/bladder cancer-s/p radiation [facility]; no chemotherapy.[Dr.Neilsen]  # AUG 11th, 2023- PET scan- Hypermetabolic metastatic osseous lesions, left supraclavicular lymph nodes, right upper lobe nodule and abdominal retroperitoneal adenopathy-  # on eliquis ? A.fib/pacemaker;   Urothelial carcinoma of bladder ([place])  10/29/2019 Initial Diagnosis   Urothelial carcinoma of bladder ([place])   Cancer of overlapping sites of bladder ([place])  02/17/2022 Initial Diagnosis   Cancer of overlapping sites of bladder ([place])   02/17/2022 Cancer Staging   Staging form:  Urinary Bladder, AJCC 8th Edition - Clinical: Stage Unknown (cTX, cN2, cM1) - Signed by Cammie Sickle, MD on 02/17/2022     ALLERGIES:  is allergic to atenolol.  MEDICATIONS:  Current Outpatient Medications  Medication Sig Dispense Refill   alfuzosin (UROXATRAL) 10 MG 24 hr tablet Take 1 tablet (10 mg total) by mouth daily with breakfast. (Patient not taking: Reported on 02/22/2022) 90 tablet 3   atorvastatin (LIPITOR) 10 MG tablet Take 10 mg by mouth in the morning.     calcium carbonate (OSCAL) 1500 (600 Ca) MG TABS tablet Take 600 mg of elemental calcium by mouth in the morning.     Cholecalciferol (VITAMIN D3) 50 MCG (2000 UT) TABS Take 2,000 Units by mouth in the morning.     diltiazem (CARDIZEM CD) 240 MG 24 hr capsule Take 1 capsule (240 mg total) by mouth daily. 30 capsule 0   ferrous sulfate 325 (65 FE) MG tablet Take 325 mg by mouth in the morning and at bedtime.     finasteride (PROSCAR) 5 MG tablet TAKE 1 TABLET DAILY (Patient taking differently: Take 5 mg by mouth daily.) 90 tablet 3   HYDROcodone-acetaminophen (NORCO/VICODIN) 5-325 MG tablet Take 1 tablet by mouth every 8 (eight) hours as needed for moderate pain. 45 tablet 0   metoprolol tartrate (LOPRESSOR) 50 MG tablet Take 1 tablet (  50 mg total) by mouth 2 (two) times daily. 60 tablet 0   Multiple Vitamin (MULTIVITAMIN WITH MINERALS) TABS tablet Take 1 tablet by mouth in the morning.     Multiple Vitamins-Minerals (PRESERVISION AREDS 2 PO) Take 1 tablet by mouth in the morning and at bedtime.     Omega-3 Fatty Acids (FISH OIL) 1000 MG CAPS Take 1,000 mg by mouth daily.     predniSONE (DELTASONE) 20 MG tablet Take 2 pills a day for 1 week; and then 1 pill a day for 1 week.  Taking the morning with food. 40 tablet 0   tamsulosin (FLOMAX) 0.4 MG CAPS capsule Take 0.4 mg by mouth daily.     traMADol (ULTRAM) 50 MG tablet Take 1 tablet (50 mg total) by mouth every 12 (twelve) hours as needed. (Patient not taking: Reported  on 02/22/2022) 45 tablet 0   No current facility-administered medications for this visit.    VITAL SIGNS: BP (!) 83/46   Pulse 60   Temp (!) 97 F (36.1 C) (Tympanic)   Resp 18   SpO2 100%  There were no vitals filed for this visit.  Estimated body mass index is 22.87 kg/m as calculated from the following:   Height as of an earlier encounter on 02/22/22: '5\' 11"'$  (1.803 m).   Weight as of an earlier encounter on 02/22/22: 164 lb (74.4 kg).  LABS: CBC:    Component Value Date/Time   WBC 6.8 02/22/2022 1155   HGB 12.2 (L) 02/22/2022 1155   HGB 13.2 01/13/2020 1412   HCT 37.2 (L) 02/22/2022 1155   HCT 39.5 01/13/2020 1412   PLT 242 02/22/2022 1155   PLT 222 01/13/2020 1412   MCV 96.6 02/22/2022 1155   MCV 90 01/13/2020 1412   NEUTROABS 5.0 02/22/2022 1155   NEUTROABS 3.6 01/13/2020 1412   LYMPHSABS 0.7 02/22/2022 1155   LYMPHSABS 1.1 01/13/2020 1412   MONOABS 0.9 02/22/2022 1155   EOSABS 0.1 02/22/2022 1155   EOSABS 0.1 01/13/2020 1412   BASOSABS 0.0 02/22/2022 1155   BASOSABS 0.0 01/13/2020 1412   Comprehensive Metabolic Panel:    Component Value Date/Time   NA 137 02/22/2022 1155   NA 141 01/13/2020 1412   K 4.0 02/22/2022 1155   K 4.4 08/10/2011 1443   CL 100 02/22/2022 1155   CO2 31 02/22/2022 1155   BUN 21 02/22/2022 1155   BUN 18 01/13/2020 1412   CREATININE 0.84 02/22/2022 1155   GLUCOSE 111 (H) 02/22/2022 1155   CALCIUM 9.7 02/22/2022 1155   AST 18 02/22/2022 1155   ALT 17 02/22/2022 1155   ALKPHOS 79 02/22/2022 1155   BILITOT 1.2 02/22/2022 1155   PROT 7.3 02/22/2022 1155   ALBUMIN 3.3 (L) 02/22/2022 1155    RADIOGRAPHIC STUDIES: NM PET Image Restag (PS) Skull Base To Thigh  Result Date: 02/16/2022 CLINICAL DATA:  Initial treatment strategy for peritoneal carcinomatosis. EXAM: NUCLEAR MEDICINE PET SKULL BASE TO THIGH TECHNIQUE: 8.7 mCi F-18 FDG was injected intravenously. Full-ring PET imaging was performed from the skull base to thigh after the  radiotracer. CT data was obtained and used for attenuation correction and anatomic localization. Fasting blood glucose: 119 mg/dl COMPARISON:  CT abdomen pelvis 01/24/2022. FINDINGS: Mediastinal blood pool activity: SUV max 1.7 Liver activity: SUV max NA NECK: No hypermetabolic adenopathy. Incidental CT findings: None. CHEST: Hypermetabolic left supraclavicular lymph nodes measure up to 7 mm (2/72), SUV max 5.8. 8 mm peripheral right upper lobe nodule (2/85), SUV max 3.9.  Possible hypermetabolic right hilar lymph node, SUV max 2.7. Comparison with CT is challenging without IV contrast. No hypermetabolic mediastinal or axillary lymph nodes. Incidental CT findings: Atherosclerotic calcification of the aorta, aortic valve and coronary arteries. Heart is enlarged. No pericardial or pleural effusion. There may be mild coarsening of the pulmonary markings. ABDOMEN/PELVIS: Periaortic lymph nodes measure up to 1.3 cm in the left periaortic station (2/156), SUV max 5.5. No abnormal hypermetabolism in the liver, adrenal glands, spleen or pancreas. No abnormal peritoneal hypermetabolism. Incidental CT findings: Liver, gallbladder, adrenal glands, kidneys, spleen, pancreas, stomach and bowel are grossly unremarkable. Atherosclerotic calcification of the aorta. Left posterolateral bladder diverticulum contains a tiny stone. Mild perivesical haziness. Previously questioned omental thickening is not well visualized. SKELETON: Multiple hypermetabolic lesions are seen in the visualized osseous structures. Index hypermetabolism in the L1 vertebral body, SUV max 10.3, corresponds to a 1.7 cm lytic lesion. Incidental CT findings: Degenerative changes in the spine. Spinal stimulator wires in place. IMPRESSION: 1. Hypermetabolic metastatic osseous lesions, left supraclavicular lymph nodes, right upper lobe nodule and abdominal retroperitoneal adenopathy, possibly related to the patient's known diagnosis of bladder cancer. Melanoma not  excluded. 2. Omental thickening seen on 01/24/2022 is no longer appreciated. 3. Left posterolateral bladder diverticulum with a tiny stone. Mild perivesical haziness, possibly treatment related or due to cystitis. 4. Aortic atherosclerosis (ICD10-I70.0). Coronary artery calcification. Electronically Signed   By: Lorin Picket M.D.   On: 02/16/2022 14:32   CT ABDOMEN PELVIS W CONTRAST  Result Date: 01/25/2022 CLINICAL DATA:  History of bladder cancer. Worsening right-sided lower and mid abdominal pain times several months. * Tracking Code: BO *. EXAM: CT ABDOMEN AND PELVIS WITH CONTRAST TECHNIQUE: Multidetector CT imaging of the abdomen and pelvis was performed using the standard protocol following bolus administration of intravenous contrast. RADIATION DOSE REDUCTION: This exam was performed according to the departmental dose-optimization program which includes automated exposure control, adjustment of the mA and/or kV according to patient size and/or use of iterative reconstruction technique. CONTRAST:  169m OMNIPAQUE IOHEXOL 300 MG/ML  SOLN COMPARISON:  CT March 16, 2020. FINDINGS: Lower chest: No acute abnormality. Coronary artery calcifications. Partially visualized intracardiac leads. Symmetric distal esophageal wall thickening. Hepatobiliary: No suspicious hepatic lesion. Gallbladder is unremarkable. No biliary ductal dilation. Pancreas: No pancreatic ductal dilation or evidence of acute inflammation. Spleen: No splenomegaly or focal splenic lesion. Adrenals/Urinary Tract: Bilateral adrenal glands appear normal. No hydronephrosis. Kidneys demonstrate symmetric enhancement and excretion of contrast material. Mild irregular left-sided urinary bladder wall thickening of a minimally distended urinary bladder. Stone layering dependently in a left-sided urinary bladder diverticulum with urothelial hyperenhancement. Stomach/Bowel: Radiopaque enteric contrast material traverses the splenic flexure. Small  hiatal hernia otherwise the stomach is unremarkable for degree of distension. No pathologic dilation of small or large bowel. Moderate volume of formed stool throughout the colon suggestive of constipation. Left-sided colonic diverticulosis without findings of acute diverticulitis. Vascular/Lymphatic: Aortic atherosclerosis without aneurysmal dilation. Newly enlarged retroperitoneal lymph nodes/nodal tissue at the level of the renal hila measuring up to 13 mm in short axis on image 44/2. Reproductive: Dystrophic prostatic calcifications. Other: Trace abdominopelvic free fluid and mesenteric stranding. Similar vague appearing omental nodularity for instance on image 49/2. Musculoskeletal: Spinal stimulating generator in the right posterior gluteal subcutaneous soft tissues with leads extending into the thorax. Multilevel degenerative changes spine. Similar appearance of the T11 vertebral body compression deformity. Demineralization of bone. Bridging anterior osteophytes/syndesmophytes. IMPRESSION: 1. Mild irregular left-sided wall thickening of an incompletely distended urinary  bladder. Stone layering dependently in the left-sided urinary bladder diverticulum with urothelial hyperenhancement of the diverticulum, possibly reflecting diverticulitis. Given poor urinary bladder distension and bladder wall irregularity consider further evaluation with cystoscopy. 2. New enlarged retroperitoneal lymph nodes/nodal tissue is nonspecific but would be concerning for nodal disease involvement in the setting of neoplasm. 3. Similar vague soft tissue nodularity in the omentum and trace abdominopelvic fluid/mesenteric stranding. 4. Symmetric distal esophageal wall thickening may reflect esophagitis. 5. Moderate volume of formed stool throughout the colon suggestive of constipation. 6. Colonic diverticulosis without findings of acute diverticulitis. 7.  Aortic Atherosclerosis (ICD10-I70.0). Electronically Signed   By: Dahlia Bailiff M.D.   On: 01/25/2022 12:00    PERFORMANCE STATUS (ECOG) : 3 - Symptomatic, >50% confined to bed  Review of Systems Unless otherwise noted, a complete review of systems is negative.  Physical Exam General: NAD Cardiovascular: regular rate and rhythm Pulmonary: clear ant fields Abdomen: soft, nontender, + bowel sounds GU: no suprapubic tenderness Extremities: no edema, no joint deformities Skin: no rashes Neurological: Weakness but otherwise nonfocal  IMPRESSION/PLAN: Weakness/dizziness -suspect component of dehydration as patient reports that he is drinking minimal fluids.  We will proceed with IV fluids today.  Discussed fall precautions.  We will send referral for rehab screening and community palliative care.  We will also send nutrition referral due to poor oral intake.  Neoplasm related pain-patient is pending XRT.  Start Norco 5-325 milligrams every 6 hours as needed for pain #45.  Discussed liberalizing bowel regimen to prevent opioid-induced constipation.  I would like patient to return next week for repeat labs/[facility]/possible fluids.  Patient can RTC sooner if needed.  Patient expressed understanding and was in agreement with this plan. He also understands that He can call clinic at any time with any questions, concerns, or complaints.   Thank you for allowing me to participate in the care of this very pleasant patient.   Time Total: 25 minutes  Visit consisted of counseling and education dealing with the complex and emotionally intense issues of symptom management in the setting of serious illness.Greater than 50%  of this time was spent counseling and coordinating care related to the above assessment and plan.  Signed by: Altha Harm, PhD, NP-C

## 2022-02-22 NOTE — Consult Note (Signed)
NEW PATIENT EVALUATION  Name: Christopher [name]  MRN: [id number]  Date:   02/22/2022     DOB: [birth year]   This 88 y.o. male patient presents to the clinic for initial evaluation of stage IV bladder cancer with metastatic disease to lumbar spine.  REFERRING PHYSICIAN: [name], MD  CHIEF COMPLAINT:  Chief Complaint  Patient presents with   Bladder Cancer    DIAGNOSIS: The encounter diagnosis was Cancer of overlapping sites of bladder [facility]).   PREVIOUS INVESTIGATIONS:  PET CT scan reviewed Pathology reports reviewed Clinical notes reviewed  HPI: Patient is a 88 year old male diagnosed in November 2020 with plaque-like lesions in the peritoneum.  He was noted on to have microscopic hematuria in March 2021 and was diagnosed with a new lesion in the bladder diverticulum.  He underwent radiation therapy at [facility] for bladder cancer July 2022.  In August 2023 PET scan demonstrated hypermetabolic metastatic osseous lesions as well as involve the left supraclavicular lymph nodes right upper lobe and abdominal retroperitoneal adenopathy.  His peritoneal and omental thickening is present but not hypermetabolic.  He is scheduled for lymph node biopsy.  He continues to have low back pain although it has been present for years.  Recent PET CT scan again shows osseous metastatic disease with L1 involvement with my interpretation close impingement on the spinal cord.  He is having difficulty ambulating.  He can he continues to have worsening abdominal pain  PLANNED TREATMENT REGIMEN: Palliative radiation therapy to lumbar spine  PAST MEDICAL HISTORY:  has a past medical history of A-fib ([place]), Anemia, Arthritis, Bladder cancer ([place]), BPH (benign prostatic hyperplasia), Cancer ([place]), ED (erectile dysfunction), Rosanna Randy disease, HLD (hyperlipidemia), Hypertension, Macular degeneration, Mollusca contagiosa, Osteopenia, Presence of permanent cardiac pacemaker, Sensorineural hearing loss, Squamous  cell carcinoma of skin (08/25/2019), Squamous cell carcinoma of skin (02/12/2020), Squamous cell carcinoma of skin (11/30/2020), Squamous cell carcinoma of skin (03/16/2021), Squamous cell carcinoma of skin (03/16/2021), Squamous cell carcinoma of skin (10/05/2021), Squamous cell carcinoma of skin (11/22/2021), and Squamous cell carcinoma of skin (11/22/2021).    PAST SURGICAL HISTORY:  Past Surgical History:  Procedure Laterality Date   CATARACT EXTRACTION Bilateral    CYSTOSCOPY WITH BIOPSY N/A 01/20/2020   Procedure: CYSTOSCOPY WITH BIOPSY;  Surgeon: Abbie Sons, MD;  Location: [facility] ORS;  Service: Urology;  Laterality: N/A;   CYSTOSCOPY WITH FULGERATION N/A 01/20/2020   Procedure: CYSTOSCOPY WITH FULGERATION;  Surgeon: Abbie Sons, MD;  Location: [facility] ORS;  Service: Urology;  Laterality: N/A;   CYSTOSCOPY WITH FULGERATION N/A 04/19/2020   Procedure: [place] with clot evacuation;  Surgeon: Abbie Sons, MD;  Location: [facility] ORS;  Service: Urology;  Laterality: N/A;   CYSTOSCOPY WITH FULGERATION N/A 01/05/2021   Procedure: CYSTOSCOPY WITH FULGERATION;  Surgeon: Abbie Sons, MD;  Location: [facility] ORS;  Service: Urology;  Laterality: N/A;   HEMORRHOIDECTOMY WITH HEMORRHOID BANDING     KNEE ARTHROSCOPY Left    PACEMAKER INSERTION N/A 03/03/2015   Procedure: INSERTION DUAL LEAD PACEMAKER;  Surgeon: Isaias Cowman, MD;  Location: [facility] ORS;  Service: Cardiovascular;  Laterality: N/A;   PULSE GENERATOR IMPLANT N/A 10/18/2020   Procedure: UNILATERAL PULSE GENERATOR IMPLANT;  Surgeon: [name], MD;  Location: [facility] OR[facility];  Service: Neurosurgery;  Laterality: N/A;   SPINAL CORD STIMULATOR TRIAL N/A 10/11/2020   Procedure: PERCUTANEOUS SPINAL CORD STIMULATOR TRIAL;  Surgeon: [name], MD;  Location: [facility] OR[facility];  Service: Neurosurgery;  Laterality: N/A;   THORACIC LAMINECTOMY FOR  SPINAL CORD STIMULATOR N/A 10/18/2020   Procedure: PERCUTANEOUS LEAD PLACEMENT;  Surgeon:  [name], MD;  Location: [facility] OR[facility];  Service: Neurosurgery;  Laterality: N/A;   TONSILLECTOMY     TRANSURETHRAL RESECTION OF BLADDER TUMOR N/A 10/07/2019   Procedure: TRANSURETHRAL RESECTION OF BLADDER TUMOR (TURBT);  Surgeon: Abbie Sons, MD;  Location: [facility] OR[facility];  Service: Urology;  Laterality: N/A;   TRANSURETHRAL RESECTION OF BLADDER TUMOR N/A 04/13/2020   Procedure: TRANSURETHRAL RESECTION OF BLADDER TUMOR (TURBT);  Surgeon: Abbie Sons, MD;  Location: [facility] ORS;  Service: Urology;  Laterality: N/A;    FAMILY HISTORY: family history includes Cancer in his paternal uncle; Diabetes in his mother; Lung cancer in his father.  SOCIAL HISTORY:  reports that he has never smoked. He has never used smokeless tobacco. He reports current alcohol use of about 7.0 standard drinks of alcohol per week. He reports that he does not use drugs.  ALLERGIES: Atenolol  MEDICATIONS:  Current Outpatient Medications  Medication Sig Dispense Refill   atorvastatin (LIPITOR) 10 MG tablet Take 10 mg by mouth in the morning.     calcium carbonate (OSCAL) 1500 (600 Ca) MG TABS tablet Take 600 mg of elemental calcium by mouth in the morning.     Cholecalciferol (VITAMIN D3) 50 MCG (2000 UT) TABS Take 2,000 Units by mouth in the morning.     diltiazem (CARDIZEM CD) 240 MG 24 hr capsule Take 1 capsule (240 mg total) by mouth daily. 30 capsule 0   ferrous sulfate 325 (65 FE) MG tablet Take 325 mg by mouth in the morning and at bedtime.     finasteride (PROSCAR) 5 MG tablet TAKE 1 TABLET DAILY (Patient taking differently: Take 5 mg by mouth daily.) 90 tablet 3   HYDROcodone-acetaminophen (NORCO/VICODIN) 5-325 MG tablet Take 1 tablet by mouth every 8 (eight) hours as needed for moderate pain. 45 tablet 0   metoprolol tartrate (LOPRESSOR) 50 MG tablet Take 1 tablet (50 mg total) by mouth 2 (two) times daily. 60 tablet 0   Multiple Vitamin (MULTIVITAMIN WITH MINERALS) TABS tablet Take 1 tablet by mouth in the  morning.     Multiple Vitamins-Minerals (PRESERVISION AREDS 2 PO) Take 1 tablet by mouth in the morning and at bedtime.     Omega-3 Fatty Acids (FISH OIL) 1000 MG CAPS Take 1,000 mg by mouth daily.     predniSONE (DELTASONE) 20 MG tablet Take 2 pills a day for 1 week; and then 1 pill a day for 1 week.  Taking the morning with food. 40 tablet 0   tamsulosin (FLOMAX) 0.4 MG CAPS capsule Take 0.4 mg by mouth daily.     alfuzosin (UROXATRAL) 10 MG 24 hr tablet Take 1 tablet (10 mg total) by mouth daily with breakfast. (Patient not taking: Reported on 02/22/2022) 90 tablet 3   No current facility-administered medications for this encounter.    ECOG PERFORMANCE STATUS:  2 - Symptomatic, <50% confined to bed  REVIEW OF SYSTEMS: Patient has multiple medical comorbidities including A-fib anemia bladder cancer has a pacemaker. Patient denies any weight loss, fatigue, weakness, fever, chills or night sweats. Patient denies any loss of vision, blurred vision. Patient denies any ringing  of the ears or hearing loss. No irregular heartbeat. Patient denies heart murmur or history of fainting. Patient denies any chest pain or pain radiating to her upper extremities. Patient denies any shortness of breath, difficulty breathing at night, cough or hemoptysis. Patient denies any swelling in the lower  legs. Patient denies any nausea vomiting, vomiting of blood, or coffee ground material in the vomitus. Patient denies any stomach pain. Patient states has had normal bowel movements no significant constipation or diarrhea. Patient denies any dysuria, hematuria or significant nocturia. Patient denies any problems walking, swelling in the joints or loss of balance. Patient denies any skin changes, loss of hair or loss of weight. Patient denies any excessive worrying or anxiety or significant depression. Patient denies any problems with insomnia. Patient denies excessive thirst, polyuria, polydipsia. Patient denies any swollen  glands, patient denies easy bruising or easy bleeding. Patient denies any recent infections, allergies or URI. Patient "s visual fields have not changed significantly in recent time.   PHYSICAL EXAM: BP (!) 107/57 (BP Location: Left Arm, Patient Position: Sitting, Cuff Size: Normal)   Pulse 60   Temp (!) 97.4 F (36.3 C) (Tympanic)   Resp 16   Ht '5\' 11"'$  (1.803 m) Comment: stated ht  Wt 164 lb (74.4 kg)   BMI 22.87 kg/m  Elderly wheelchair-bound male somewhat frail in NAD.  Does have some decreased strength in his lower extremities bilaterally no focal neurologic deficits are noted.  Deep palpation of the spine does not elicit pain.  Well-developed well-nourished patient in NAD. HEENT reveals PERLA, EOMI, discs not visualized.  Oral cavity is clear. No oral mucosal lesions are identified. Neck is clear without evidence of cervical or supraclavicular adenopathy. Lungs are clear to A&P. Cardiac examination is essentially unremarkable with regular rate and rhythm without murmur rub or thrill. Abdomen is benign with no organomegaly or masses noted. Motor sensory and DTR levels are equal and symmetric in the upper and lower extremities. Cranial nerves II through XII are grossly intact. Proprioception is intact. No peripheral adenopathy or edema is identified. No motor or sensory levels are noted. Crude visual fields are within normal range.  LABORATORY DATA: Path reports reviewed    RADIOLOGY RESULTS: PET CT scan reviewed compatible with above-stated findings   IMPRESSION: Metastatic involvement of L1 with possible impingement on the spinal cord in 88 year old male with stage IV bladder cancer  PLAN: This time I area of most concern is L1 lesion with close impingement on the spinal cord.  We will plan on delivering 30 Gray in 10 fractions.  Risks and benefits of treatment including possible skin reaction fatigue slight chance of diarrhea all were discussed in detail with the patient and his family.   I have personally set up and ordered CT simulation for tomorrow and will start his treatments by first thing next week.  I would like to take this opportunity to thank you for allowing me to participate in the care of your patient.Noreene Filbert, MD

## 2022-02-22 NOTE — Telephone Encounter (Signed)
Patient was evaluated by [facility] today.

## 2022-02-22 NOTE — Progress Notes (Signed)
[place] add-on. Pt presents for dizziness for the last 2-3 days. He reports that he fell yesterday, but denies any injuries. He states that his urine is red, but attributes this to not drinking enough water. He says that if drinks enough water, his urine is usually light yellow.

## 2022-02-22 NOTE — Telephone Encounter (Signed)
Not able to E-scribe on 8/15 and pharmacy can't accept faxed rx.  Pending for MD to try e-scribing today.

## 2022-02-22 NOTE — Telephone Encounter (Signed)
Not able to E-scribe on 8/15 and pharmacy can't accept faxed rx.   Refill request pended for MD to try e-scribing today.

## 2022-02-22 NOTE — Telephone Encounter (Signed)
Dr. B spoke to Pete Glatter, NP and he will send rx

## 2022-02-23 ENCOUNTER — Ambulatory Visit
Admission: RE | Admit: 2022-02-23 | Discharge: 2022-02-23 | Disposition: A | Discharge status: Home / Self Care | Payer: Medicare HMO | Source: Ambulatory Visit | Attending: Internal Medicine | Admitting: Internal Medicine

## 2022-02-23 ENCOUNTER — Ambulatory Visit
Admission: RE | Admit: 2022-02-23 | Discharge: 2022-02-23 | Disposition: A | Discharge status: Home / Self Care | Payer: Medicare HMO | Source: Ambulatory Visit | Attending: Radiation Oncology | Admitting: Radiation Oncology

## 2022-02-23 DIAGNOSIS — Z510 Encounter for antineoplastic radiation therapy: Secondary | ICD-10-CM | POA: Diagnosis not present

## 2022-02-23 DIAGNOSIS — C678 Malignant neoplasm of overlapping sites of bladder: Secondary | ICD-10-CM | POA: Insufficient documentation

## 2022-02-23 DIAGNOSIS — C770 Secondary and unspecified malignant neoplasm of lymph nodes of head, face and neck: Secondary | ICD-10-CM | POA: Diagnosis not present

## 2022-02-23 DIAGNOSIS — C7951 Secondary malignant neoplasm of bone: Secondary | ICD-10-CM | POA: Insufficient documentation

## 2022-02-23 LAB — URINE CULTURE: Culture: 50000 — AB

## 2022-02-24 ENCOUNTER — Encounter: Payer: Self-pay | Admitting: *Deleted

## 2022-02-24 ENCOUNTER — Other Ambulatory Visit: Payer: Self-pay | Admitting: *Deleted

## 2022-02-24 ENCOUNTER — Encounter: Payer: Self-pay | Admitting: Internal Medicine

## 2022-02-24 DIAGNOSIS — C678 Malignant neoplasm of overlapping sites of bladder: Secondary | ICD-10-CM

## 2022-02-24 DIAGNOSIS — Z510 Encounter for antineoplastic radiation therapy: Secondary | ICD-10-CM | POA: Diagnosis not present

## 2022-02-27 ENCOUNTER — Inpatient Hospital Stay: Payer: Medicare HMO

## 2022-02-27 ENCOUNTER — Inpatient Hospital Stay (HOSPITAL_BASED_OUTPATIENT_CLINIC_OR_DEPARTMENT_OTHER): Payer: Medicare HMO | Admitting: Hospice and Palliative Medicine

## 2022-02-27 ENCOUNTER — Other Ambulatory Visit: Payer: Self-pay

## 2022-02-27 ENCOUNTER — Ambulatory Visit
Admission: RE | Admit: 2022-02-27 | Discharge: 2022-02-27 | Disposition: A | Discharge status: Home / Self Care | Payer: Medicare HMO | Source: Ambulatory Visit | Attending: Radiation Oncology | Admitting: Radiation Oncology

## 2022-02-27 VITALS — BP 107/59 | HR 66 | Temp 97.0°F | Resp 20 | Ht 71.0 in | Wt 160.0 lb

## 2022-02-27 VITALS — BP 116/58 | HR 61

## 2022-02-27 DIAGNOSIS — C678 Malignant neoplasm of overlapping sites of bladder: Secondary | ICD-10-CM

## 2022-02-27 DIAGNOSIS — I951 Orthostatic hypotension: Secondary | ICD-10-CM

## 2022-02-27 DIAGNOSIS — Z510 Encounter for antineoplastic radiation therapy: Secondary | ICD-10-CM | POA: Diagnosis not present

## 2022-02-27 LAB — CBC WITH DIFFERENTIAL/PLATELET
Abs Immature Granulocytes: 0.08 10*3/uL — ABNORMAL HIGH (ref 0.00–0.07)
Basophils Absolute: 0 10*3/uL (ref 0.0–0.1)
Basophils Relative: 0 %
Eosinophils Absolute: 0 10*3/uL (ref 0.0–0.5)
Eosinophils Relative: 0 %
HCT: 39.3 % (ref 39.0–52.0)
Hemoglobin: 13.2 g/dL (ref 13.0–17.0)
Immature Granulocytes: 1 %
Lymphocytes Relative: 10 %
Lymphs Abs: 1 10*3/uL (ref 0.7–4.0)
MCH: 31.9 pg (ref 26.0–34.0)
MCHC: 33.6 g/dL (ref 30.0–36.0)
MCV: 94.9 fL (ref 80.0–100.0)
Monocytes Absolute: 1.3 10*3/uL — ABNORMAL HIGH (ref 0.1–1.0)
Monocytes Relative: 12 %
Neutro Abs: 8.2 10*3/uL — ABNORMAL HIGH (ref 1.7–7.7)
Neutrophils Relative %: 77 %
Platelets: 281 10*3/uL (ref 150–400)
RBC: 4.14 MIL/uL — ABNORMAL LOW (ref 4.22–5.81)
RDW: 15.7 % — ABNORMAL HIGH (ref 11.5–15.5)
WBC: 10.6 10*3/uL — ABNORMAL HIGH (ref 4.0–10.5)
nRBC: 0 % (ref 0.0–0.2)

## 2022-02-27 LAB — RAD ONC ARIA SESSION SUMMARY
Course Elapsed Days: 0
Plan Fractions Treated to Date: 1
Plan Prescribed Dose Per Fraction: 3 Gy
Plan Total Fractions Prescribed: 10
Plan Total Prescribed Dose: 30 Gy
Reference Point Dosage Given to Date: 3 Gy
Reference Point Session Dosage Given: 3 Gy
Session Number: 1

## 2022-02-27 LAB — COMPREHENSIVE METABOLIC PANEL
ALT: 50 U/L — ABNORMAL HIGH (ref 0–44)
AST: 40 U/L (ref 15–41)
Albumin: 3.3 g/dL — ABNORMAL LOW (ref 3.5–5.0)
Alkaline Phosphatase: 86 U/L (ref 38–126)
Anion gap: 6 (ref 5–15)
BUN: 27 mg/dL — ABNORMAL HIGH (ref 8–23)
CO2: 32 mmol/L (ref 22–32)
Calcium: 9.8 mg/dL (ref 8.9–10.3)
Chloride: 100 mmol/L (ref 98–111)
Creatinine, Ser: 0.97 mg/dL (ref 0.61–1.24)
GFR, Estimated: >60 mL/min (ref >60)
Glucose, Bld: 99 mg/dL (ref 70–99)
Potassium: 4 mmol/L (ref 3.5–5.1)
Sodium: 138 mmol/L (ref 135–145)
Total Bilirubin: 1 mg/dL (ref 0.3–1.2)
Total Protein: 7.3 g/dL (ref 6.5–8.1)

## 2022-02-27 MED ORDER — SODIUM CHLORIDE 0.9 % IV SOLN
INTRAVENOUS | Status: DC
  Filled 2022-02-27: qty 250

## 2022-02-27 NOTE — Progress Notes (Signed)
Symptom Management [place] at [facility] [phone]) [phone] [phone]  Patient Care Team: [name], MD as PCP - General (Infectious Diseases) Christopher Sickle, MD as Consulting Physician (Oncology)   NAME OF PATIENT: Christopher [name]  [id number]  Jul 05, 1928   DATE OF VISIT: 02/27/22  REASON FOR CONSULT: Christopher [name] is a 88 y.o. male with multiple medical problems including history of peritoneal/omental thickening of unclear etiology also with recurrent high-grade bladder cancer status post XRT at [name].  PET scan February 17, 2022 revealed hypermetabolic metastatic osseous lesions, left supraclavicular lymph node involvement, right upper lobe, and hypermetabolic abdominal retroperitoneal adenopathy.  Patient was not felt to be a candidate for systemic chemotherapy given his age.  He had persistent lumbar back pain likely secondary to metastatic lesions.  He was referred to XRT for that.  INTERVAL HISTORY: Patient seen in [name] on 02/22/2022 with weakness and dizziness probably stemming from dehydration.  He received IV fluids.  Patient was requested to have follow-up with his cardiologist to consider dose reduction in antihypertensives.  Patient was an add-on today and states that he still feels weak and has positional dizziness/unsteadiness.  He denies recent falls.  He reports that he knows he should be drinking more fluids.  He denies headaches or visual changes.  He does not feel dizzy when sitting.  Denies any neurologic complaints. Denies recent fevers or illnesses. Denies any easy bleeding or bruising.  Denies chest pain. Denies any nausea, vomiting, area.  Has had constipation.  Denies urinary complaints. Patient offers no further specific complaints today.  PAST MEDICAL HISTORY: Past Medical History:  Diagnosis Date   A-fib ([place])    Anemia    Arthritis    BACK   Bladder cancer (HCC)    BPH (benign prostatic  hyperplasia)    Cancer (HCC)    skin cancer on face carcinoma   ED (erectile dysfunction)    Rosanna Randy disease    HLD (hyperlipidemia)    Hypertension    Macular degeneration    Mollusca contagiosa    Osteopenia    Presence of permanent cardiac pacemaker    Sensorineural hearing loss    Squamous cell carcinoma of skin 08/25/2019   R cheek    Squamous cell carcinoma of skin 02/12/2020   L neck    Squamous cell carcinoma of skin 11/30/2020   Right elbow, [facility]   Squamous cell carcinoma of skin 03/16/2021   right base of thumb [facility]   Squamous cell carcinoma of skin 03/16/2021   right preauricular - [facility] 11/17/21   Squamous cell carcinoma of skin 10/05/2021   Right dorsum hand - [facility]   Squamous cell carcinoma of skin 11/22/2021   right inf preauricular anterior, [facility]   Squamous cell carcinoma of skin 11/22/2021   right inf preauricular posterior, [facility]    PAST SURGICAL HISTORY:  Past Surgical History:  Procedure Laterality Date   CATARACT EXTRACTION Bilateral    CYSTOSCOPY WITH BIOPSY N/A 01/20/2020   Procedure: CYSTOSCOPY WITH BIOPSY;  Surgeon: Abbie Sons, MD;  Location: [facility];  Service: Urology;  Laterality: N/A;   CYSTOSCOPY WITH FULGERATION N/A 01/20/2020   Procedure: CYSTOSCOPY WITH FULGERATION;  Surgeon: Abbie Sons, MD;  Location: [facility];  Service: Urology;  Laterality: N/A;   CYSTOSCOPY WITH FULGERATION N/A 04/19/2020   Procedure: [place] with clot evacuation;  Surgeon: Abbie Sons, MD;  Location: [facility];  Service: Urology;  Laterality: N/A;  CYSTOSCOPY WITH FULGERATION N/A 01/05/2021   Procedure: CYSTOSCOPY WITH FULGERATION;  Surgeon: Abbie Sons, MD;  Location: [facility];  Service: Urology;  Laterality: N/A;   HEMORRHOIDECTOMY WITH HEMORRHOID BANDING     KNEE ARTHROSCOPY Left    PACEMAKER INSERTION N/A 03/03/2015   Procedure: INSERTION DUAL LEAD PACEMAKER;  Surgeon: Isaias Cowman, MD;  Location: [facility];  Service:  Cardiovascular;  Laterality: N/A;   PULSE GENERATOR IMPLANT N/A 10/18/2020   Procedure: UNILATERAL PULSE GENERATOR IMPLANT;  Surgeon: [name], MD;  Location: [facility];  Service: Neurosurgery;  Laterality: N/A;   SPINAL CORD STIMULATOR TRIAL N/A 10/11/2020   Procedure: PERCUTANEOUS SPINAL CORD STIMULATOR TRIAL;  Surgeon: [name], MD;  Location: [facility];  Service: Neurosurgery;  Laterality: N/A;   THORACIC LAMINECTOMY FOR SPINAL CORD STIMULATOR N/A 10/18/2020   Procedure: PERCUTANEOUS LEAD PLACEMENT;  Surgeon: [name], MD;  Location: [facility];  Service: Neurosurgery;  Laterality: N/A;   TONSILLECTOMY     TRANSURETHRAL RESECTION OF BLADDER TUMOR N/A 10/07/2019   Procedure: TRANSURETHRAL RESECTION OF BLADDER TUMOR (TURBT);  Surgeon: Abbie Sons, MD;  Location: [facility];  Service: Urology;  Laterality: N/A;   TRANSURETHRAL RESECTION OF BLADDER TUMOR N/A 04/13/2020   Procedure: TRANSURETHRAL RESECTION OF BLADDER TUMOR (TURBT);  Surgeon: Abbie Sons, MD;  Location: [facility];  Service: Urology;  Laterality: N/A;    HEMATOLOGY/ONCOLOGY HISTORY:  Oncology History Overview Note  # NOV 2020- [incidental/hematuria work-up]-subtle peritoneal thickening/plaque-like lesions; March 2021-CT scan progressive omental/peritoneal thickening  #Microscopic hematuria- sec to BPH [cystoscopy/CT urogram negative; [name]]; March 2021 CT scan-new lesion in the bladder diverticulum  # MAY-June 2021- severe IDA sec to hemauria [hb 8.5; Ferritin- ]  #July 2022-hemorrhagic shock/bladder cancer-s/p radiation [name]; no chemotherapy.[[name]]  # AUG 11th, 2023- PET scan- Hypermetabolic metastatic osseous lesions, left supraclavicular lymph nodes, right upper lobe nodule and abdominal retroperitoneal adenopathy-  # on eliquis ? A.fib/pacemaker;   Urothelial carcinoma of bladder ([place])  10/29/2019 Initial Diagnosis   Urothelial carcinoma of bladder ([place])   Cancer of overlapping sites of bladder ([place])   02/17/2022 Initial Diagnosis   Cancer of overlapping sites of bladder ([place])   02/17/2022 Cancer Staging   Staging form: Urinary Bladder, AJCC 8th Edition - Clinical: Stage Unknown (cTX, cN2, cM1) - Signed by Christopher Sickle, MD on 02/17/2022     ALLERGIES:  is allergic to atenolol.  MEDICATIONS:  Current Outpatient Medications  Medication Sig Dispense Refill   alfuzosin (UROXATRAL) 10 MG 24 hr tablet Take 1 tablet (10 mg total) by mouth daily with breakfast. (Patient not taking: Reported on 02/22/2022) 90 tablet 3   atorvastatin (LIPITOR) 10 MG tablet Take 10 mg by mouth in the morning.     calcium carbonate (OSCAL) 1500 (600 Ca) MG TABS tablet Take 600 mg of elemental calcium by mouth in the morning.     Cholecalciferol (VITAMIN D3) 50 MCG (2000 UT) TABS Take 2,000 Units by mouth in the morning.     diltiazem (CARDIZEM CD) 240 MG 24 hr capsule Take 1 capsule (240 mg total) by mouth daily. 30 capsule 0   ferrous sulfate 325 (65 FE) MG tablet Take 325 mg by mouth in the morning and at bedtime.     finasteride (PROSCAR) 5 MG tablet TAKE 1 TABLET DAILY (Patient taking differently: Take 5 mg by mouth daily.) 90 tablet 3   HYDROcodone-acetaminophen (NORCO/VICODIN) 5-325 MG tablet Take 1 tablet by mouth every 6 (six) hours as needed for moderate pain. [place]  tablet 0   metoprolol tartrate (LOPRESSOR) 50 MG tablet Take 1 tablet (50 mg total) by mouth 2 (two) times daily. 60 tablet 0   Multiple Vitamin (MULTIVITAMIN WITH MINERALS) TABS tablet Take 1 tablet by mouth in the morning.     Multiple Vitamins-Minerals (PRESERVISION AREDS 2 PO) Take 1 tablet by mouth in the morning and at bedtime.     Omega-3 Fatty Acids (FISH OIL) 1000 MG CAPS Take 1,000 mg by mouth daily.     predniSONE (DELTASONE) 20 MG tablet Take 2 pills a day for 1 week; and then 1 pill a day for 1 week.  Taking the morning with food. 40 tablet 0   tamsulosin (FLOMAX) 0.4 MG CAPS capsule Take 0.4 mg by mouth daily.     No current  facility-administered medications for this visit.   Facility-Administered Medications Ordered in Other Visits  Medication Dose Route Frequency Provider Last Rate Last Admin   0.9 %  sodium chloride infusion   Intravenous Continuous Jerae Izard, Kirt Boys, NP        VITAL SIGNS: BP (!) 107/59 (Patient Position: Sitting) Comment: sitting  Pulse 66   Temp (!) 97 F (36.1 C) (Tympanic)   Resp 20   Ht '5\' 11"'$  (1.803 m)   Wt 160 lb (72.6 kg)   BMI 22.32 kg/m  Filed Weights   02/27/22 1145  Weight: 160 lb (72.6 kg)    Estimated body mass index is 22.32 kg/m as calculated from the following:   Height as of this encounter: '5\' 11"'$  (1.803 m).   Weight as of this encounter: 160 lb (72.6 kg).  LABS: CBC:    Component Value Date/Time   WBC 10.6 (H) 02/27/2022 1153   HGB 13.2 02/27/2022 1153   HGB 13.2 01/13/2020 1412   HCT 39.3 02/27/2022 1153   HCT 39.5 01/13/2020 1412   PLT 281 02/27/2022 1153   PLT 222 01/13/2020 1412   MCV 94.9 02/27/2022 1153   MCV 90 01/13/2020 1412   NEUTROABS 8.2 (H) 02/27/2022 1153   NEUTROABS 3.6 01/13/2020 1412   LYMPHSABS 1.0 02/27/2022 1153   LYMPHSABS 1.1 01/13/2020 1412   MONOABS 1.3 (H) 02/27/2022 1153   EOSABS 0.0 02/27/2022 1153   EOSABS 0.1 01/13/2020 1412   BASOSABS 0.0 02/27/2022 1153   BASOSABS 0.0 01/13/2020 1412   Comprehensive Metabolic Panel:    Component Value Date/Time   NA 137 02/22/2022 1155   NA 141 01/13/2020 1412   K 4.0 02/22/2022 1155   K 4.4 08/10/2011 1443   CL 100 02/22/2022 1155   CO2 31 02/22/2022 1155   BUN 21 02/22/2022 1155   BUN 18 01/13/2020 1412   CREATININE 0.84 02/22/2022 1155   GLUCOSE 111 (H) 02/22/2022 1155   CALCIUM 9.7 02/22/2022 1155   AST 18 02/22/2022 1155   ALT 17 02/22/2022 1155   ALKPHOS 79 02/22/2022 1155   BILITOT 1.2 02/22/2022 1155   PROT 7.3 02/22/2022 1155   ALBUMIN 3.3 (L) 02/22/2022 1155    RADIOGRAPHIC STUDIES: [place] CORE BIOPSY (LYMPH NODES)  Result Date: 02/23/2022 INDICATION:  Lymphadenopathy EXAM: Ultrasound-guided core needle biopsy of left supraclavicular lymph node MEDICATIONS: None. ANESTHESIA/SEDATION: Local analgesia COMPLICATIONS: None immediate. PROCEDURE: Informed written consent was obtained from the patient after a thorough discussion of the procedural risks, benefits and alternatives. All questions were addressed. Maximal Sterile Barrier Technique was utilized including caps, mask, sterile gowns, sterile gloves, sterile drape, hand hygiene and skin antiseptic. A timeout was performed prior to the initiation  of the procedure. The patient was placed supine on the exam table. Ultrasound of the left supraclavicular region demonstrated abnormally enlarged hypoechoic lymph node, consistent with recent PET-CT imaging. Skin entry site was marked, and the overlying skin was prepped draped in the standard sterile fashion. Local analgesia was obtained with 1% lidocaine. Using ultrasound guidance, core needle biopsy was performed of the left supraclavicular lymph node using an 18 gauge core biopsy device x6 total passes. Specimens were submitted in saline to pathology for further handling. Limited postprocedure imaging demonstrated no hematoma. A clean dressing was placed after manual hemostasis. The patient tolerated the procedure well without immediate complication. IMPRESSION: Successful ultrasound-guided core needle biopsy of left supraclavicular lymph node. Electronically Signed   By: Albin Felling M.D.   On: 02/23/2022 15:17   NM PET Image Restag (PS) Skull Base To Thigh  Result Date: 02/16/2022 CLINICAL DATA:  Initial treatment strategy for peritoneal carcinomatosis. EXAM: NUCLEAR MEDICINE PET SKULL BASE TO THIGH TECHNIQUE: 8.7 mCi F-18 FDG was injected intravenously. Full-ring PET imaging was performed from the skull base to thigh after the radiotracer. CT data was obtained and used for attenuation correction and anatomic localization. Fasting blood glucose: 119 mg/dl  COMPARISON:  CT abdomen pelvis 01/24/2022. FINDINGS: Mediastinal blood pool activity: SUV max 1.7 Liver activity: SUV max NA NECK: No hypermetabolic adenopathy. Incidental CT findings: None. CHEST: Hypermetabolic left supraclavicular lymph nodes measure up to 7 mm (2/72), SUV max 5.8. 8 mm peripheral right upper lobe nodule (2/85), SUV max 3.9. Possible hypermetabolic right hilar lymph node, SUV max 2.7. Comparison with CT is challenging without IV contrast. No hypermetabolic mediastinal or axillary lymph nodes. Incidental CT findings: Atherosclerotic calcification of the aorta, aortic valve and coronary arteries. Heart is enlarged. No pericardial or pleural effusion. There may be mild coarsening of the pulmonary markings. ABDOMEN/PELVIS: Periaortic lymph nodes measure up to 1.3 cm in the left periaortic station (2/156), SUV max 5.5. No abnormal hypermetabolism in the liver, adrenal glands, spleen or pancreas. No abnormal peritoneal hypermetabolism. Incidental CT findings: Liver, gallbladder, adrenal glands, kidneys, spleen, pancreas, stomach and bowel are grossly unremarkable. Atherosclerotic calcification of the aorta. Left posterolateral bladder diverticulum contains a tiny stone. Mild perivesical haziness. Previously questioned omental thickening is not well visualized. SKELETON: Multiple hypermetabolic lesions are seen in the visualized osseous structures. Index hypermetabolism in the L1 vertebral body, SUV max 10.3, corresponds to a 1.7 cm lytic lesion. Incidental CT findings: Degenerative changes in the spine. Spinal stimulator wires in place. IMPRESSION: 1. Hypermetabolic metastatic osseous lesions, left supraclavicular lymph nodes, right upper lobe nodule and abdominal retroperitoneal adenopathy, possibly related to the patient's known diagnosis of bladder cancer. Melanoma not excluded. 2. Omental thickening seen on 01/24/2022 is no longer appreciated. 3. Left posterolateral bladder diverticulum with a tiny  stone. Mild perivesical haziness, possibly treatment related or due to cystitis. 4. Aortic atherosclerosis (ICD10-I70.0). Coronary artery calcification. Electronically Signed   By: Lorin Picket M.D.   On: 02/16/2022 14:32    PERFORMANCE STATUS (ECOG) : 3 - Symptomatic, >50% confined to bed  Review of Systems Unless otherwise noted, a complete review of systems is negative.  Physical Exam General: NAD Cardiovascular: regular rate and rhythm Pulmonary: clear ant fields Abdomen: soft, nontender, + bowel sounds GU: no suprapubic tenderness Extremities: no edema, no joint deformities Skin: no rashes Neurological: Weakness but otherwise nonfocal  IMPRESSION/PLAN: Weakness/dizziness -BP low normal with heart rate in 60s.  I again suspect there is a component of dehydration given recent history  of orthostasis.  Patient currently denies dizziness but states that it is with rapid position change.  Discussed slow and deliberate position changes and fall precautions.  We will proceed with IV fluids today.  Patient sees cardiology (Dr. Ubaldo Glassing) next week to discuss heart meds.  Neoplasm related pain-patient is on XRT.  Stable.  Continue Norco.    RTC later this week as previously scheduled  Patient expressed understanding and was in agreement with this plan. He also understands that He can call clinic at any time with any questions, concerns, or complaints.   Thank you for allowing me to participate in the care of this very pleasant patient.   Time Total: 15 minutes  Visit consisted of counseling and education dealing with the complex and emotionally intense issues of symptom management in the setting of serious illness.Greater than 50%  of this time was spent counseling and coordinating care related to the above assessment and plan.  Signed by: Altha Harm, PhD, NP-C

## 2022-02-28 ENCOUNTER — Encounter: Payer: Self-pay | Admitting: Internal Medicine

## 2022-02-28 ENCOUNTER — Ambulatory Visit
Admission: RE | Admit: 2022-02-28 | Discharge: 2022-02-28 | Disposition: A | Discharge status: Home / Self Care | Payer: Medicare HMO | Source: Ambulatory Visit | Attending: Radiation Oncology | Admitting: Radiation Oncology

## 2022-02-28 ENCOUNTER — Other Ambulatory Visit: Payer: Self-pay

## 2022-02-28 DIAGNOSIS — Z510 Encounter for antineoplastic radiation therapy: Secondary | ICD-10-CM | POA: Diagnosis not present

## 2022-02-28 LAB — RAD ONC ARIA SESSION SUMMARY
Course Elapsed Days: 1
Plan Fractions Treated to Date: 2
Plan Prescribed Dose Per Fraction: 3 Gy
Plan Total Fractions Prescribed: 10
Plan Total Prescribed Dose: 30 Gy
Reference Point Dosage Given to Date: 6 Gy
Reference Point Session Dosage Given: 3 Gy
Session Number: 2

## 2022-02-28 LAB — SURGICAL PATHOLOGY

## 2022-03-01 ENCOUNTER — Inpatient Hospital Stay: Payer: Medicare HMO

## 2022-03-01 ENCOUNTER — Encounter: Payer: Self-pay | Admitting: Internal Medicine

## 2022-03-01 ENCOUNTER — Ambulatory Visit
Admission: RE | Admit: 2022-03-01 | Discharge: 2022-03-01 | Disposition: A | Discharge status: Home / Self Care | Payer: Medicare HMO | Source: Ambulatory Visit | Attending: Radiation Oncology | Admitting: Radiation Oncology

## 2022-03-01 ENCOUNTER — Other Ambulatory Visit: Payer: Self-pay

## 2022-03-01 VITALS — BP 140/97 | HR 60 | Temp 97.2°F | Resp 20

## 2022-03-01 DIAGNOSIS — C678 Malignant neoplasm of overlapping sites of bladder: Secondary | ICD-10-CM | POA: Diagnosis not present

## 2022-03-01 DIAGNOSIS — E860 Dehydration: Secondary | ICD-10-CM

## 2022-03-01 DIAGNOSIS — I951 Orthostatic hypotension: Secondary | ICD-10-CM

## 2022-03-01 DIAGNOSIS — Z510 Encounter for antineoplastic radiation therapy: Secondary | ICD-10-CM | POA: Diagnosis not present

## 2022-03-01 LAB — RAD ONC ARIA SESSION SUMMARY
Course Elapsed Days: 2
Plan Fractions Treated to Date: 3
Plan Prescribed Dose Per Fraction: 3 Gy
Plan Total Fractions Prescribed: 10
Plan Total Prescribed Dose: 30 Gy
Reference Point Dosage Given to Date: 9 Gy
Reference Point Session Dosage Given: 3 Gy
Session Number: 3

## 2022-03-01 LAB — CBC WITH DIFFERENTIAL/PLATELET
Abs Immature Granulocytes: 0.07 10*3/uL (ref 0.00–0.07)
Basophils Absolute: 0 10*3/uL (ref 0.0–0.1)
Basophils Relative: 0 %
Eosinophils Absolute: 0 10*3/uL (ref 0.0–0.5)
Eosinophils Relative: 0 %
HCT: 40.3 % (ref 39.0–52.0)
Hemoglobin: 13.3 g/dL (ref 13.0–17.0)
Immature Granulocytes: 1 %
Lymphocytes Relative: 4 %
Lymphs Abs: 0.5 10*3/uL — ABNORMAL LOW (ref 0.7–4.0)
MCH: 31.6 pg (ref 26.0–34.0)
MCHC: 33 g/dL (ref 30.0–36.0)
MCV: 95.7 fL (ref 80.0–100.0)
Monocytes Absolute: 0.8 10*3/uL (ref 0.1–1.0)
Monocytes Relative: 8 %
Neutro Abs: 9.7 10*3/uL — ABNORMAL HIGH (ref 1.7–7.7)
Neutrophils Relative %: 87 %
Platelets: 274 10*3/uL (ref 150–400)
RBC: 4.21 MIL/uL — ABNORMAL LOW (ref 4.22–5.81)
RDW: 16 % — ABNORMAL HIGH (ref 11.5–15.5)
WBC: 11.2 10*3/uL — ABNORMAL HIGH (ref 4.0–10.5)
nRBC: 0 % (ref 0.0–0.2)

## 2022-03-01 LAB — BASIC METABOLIC PANEL
Anion gap: 8 (ref 5–15)
BUN: 30 mg/dL — ABNORMAL HIGH (ref 8–23)
CO2: 30 mmol/L (ref 22–32)
Calcium: 9.8 mg/dL (ref 8.9–10.3)
Chloride: 99 mmol/L (ref 98–111)
Creatinine, Ser: 0.91 mg/dL (ref 0.61–1.24)
GFR, Estimated: >60 mL/min (ref >60)
Glucose, Bld: 122 mg/dL — ABNORMAL HIGH (ref 70–99)
Potassium: 3.6 mmol/L (ref 3.5–5.1)
Sodium: 137 mmol/L (ref 135–145)

## 2022-03-01 MED ORDER — SODIUM CHLORIDE 0.9 % IV SOLN
INTRAVENOUS | Status: DC
  Filled 2022-03-01 (×2): qty 250

## 2022-03-02 ENCOUNTER — Other Ambulatory Visit: Payer: Self-pay | Admitting: Internal Medicine

## 2022-03-02 ENCOUNTER — Other Ambulatory Visit: Payer: Self-pay

## 2022-03-02 ENCOUNTER — Ambulatory Visit
Admission: RE | Admit: 2022-03-02 | Discharge: 2022-03-02 | Disposition: A | Discharge status: Home / Self Care | Payer: Medicare HMO | Source: Ambulatory Visit | Attending: Radiation Oncology | Admitting: Radiation Oncology

## 2022-03-02 DIAGNOSIS — Z510 Encounter for antineoplastic radiation therapy: Secondary | ICD-10-CM | POA: Diagnosis not present

## 2022-03-02 LAB — RAD ONC ARIA SESSION SUMMARY
Course Elapsed Days: 3
Plan Fractions Treated to Date: 4
Plan Prescribed Dose Per Fraction: 3 Gy
Plan Total Fractions Prescribed: 10
Plan Total Prescribed Dose: 30 Gy
Reference Point Dosage Given to Date: 12 Gy
Reference Point Session Dosage Given: 3 Gy
Session Number: 4

## 2022-03-02 NOTE — Progress Notes (Signed)
START ON PATHWAY REGIMEN - Bladder     A cycle is every 21 days:     Enfortumab vedotin-ejfv      Pembrolizumab   **Always confirm dose/schedule in your pharmacy ordering system**  Patient Characteristics: Advanced/Metastatic Disease, First Line, No Prior Platinum-Based Therapy OR Prior Platinum-Based Therapy and Relapse > 12 Months, Poor Renal Function (CrCl < 60 mL/min), Initial Immunotherapy-based Treatment Preferred Therapeutic Status: Advanced/Metastatic Disease Line of Therapy: First Line Prior Therapy and Relapse Status: No Prior Platinum-Based Therapy Renal Function: Poor Renal Function (CrCl < 60 mL/min) Intent of Therapy: Non-Curative / Palliative Intent, Discussed with Patient 

## 2022-03-02 NOTE — Progress Notes (Signed)
Patient on plan of care prior to pathways. 

## 2022-03-03 ENCOUNTER — Inpatient Hospital Stay: Payer: Medicare HMO

## 2022-03-03 ENCOUNTER — Other Ambulatory Visit: Payer: Self-pay

## 2022-03-03 ENCOUNTER — Inpatient Hospital Stay (HOSPITAL_BASED_OUTPATIENT_CLINIC_OR_DEPARTMENT_OTHER): Payer: Medicare HMO | Admitting: Hospice and Palliative Medicine

## 2022-03-03 ENCOUNTER — Ambulatory Visit
Admission: RE | Admit: 2022-03-03 | Discharge: 2022-03-03 | Disposition: A | Discharge status: Home / Self Care | Payer: Medicare HMO | Source: Ambulatory Visit | Attending: Radiation Oncology | Admitting: Radiation Oncology

## 2022-03-03 ENCOUNTER — Encounter: Payer: Self-pay | Admitting: Hospice and Palliative Medicine

## 2022-03-03 VITALS — BP 122/64 | HR 59 | Temp 95.9°F | Resp 20 | Ht 71.0 in | Wt 162.0 lb

## 2022-03-03 DIAGNOSIS — C678 Malignant neoplasm of overlapping sites of bladder: Secondary | ICD-10-CM | POA: Diagnosis not present

## 2022-03-03 DIAGNOSIS — E860 Dehydration: Secondary | ICD-10-CM

## 2022-03-03 DIAGNOSIS — I951 Orthostatic hypotension: Secondary | ICD-10-CM

## 2022-03-03 DIAGNOSIS — Z510 Encounter for antineoplastic radiation therapy: Secondary | ICD-10-CM | POA: Diagnosis not present

## 2022-03-03 LAB — CBC WITH DIFFERENTIAL/PLATELET
Abs Immature Granulocytes: 0.05 10*3/uL (ref 0.00–0.07)
Basophils Absolute: 0 10*3/uL (ref 0.0–0.1)
Basophils Relative: 0 %
Eosinophils Absolute: 0 10*3/uL (ref 0.0–0.5)
Eosinophils Relative: 0 %
HCT: 38.8 % — ABNORMAL LOW (ref 39.0–52.0)
Hemoglobin: 12.9 g/dL — ABNORMAL LOW (ref 13.0–17.0)
Immature Granulocytes: 1 %
Lymphocytes Relative: 9 %
Lymphs Abs: 0.9 10*3/uL (ref 0.7–4.0)
MCH: 31.5 pg (ref 26.0–34.0)
MCHC: 33.2 g/dL (ref 30.0–36.0)
MCV: 94.9 fL (ref 80.0–100.0)
Monocytes Absolute: 1.1 10*3/uL — ABNORMAL HIGH (ref 0.1–1.0)
Monocytes Relative: 11 %
Neutro Abs: 7.7 10*3/uL (ref 1.7–7.7)
Neutrophils Relative %: 79 %
Platelets: 256 10*3/uL (ref 150–400)
RBC: 4.09 MIL/uL — ABNORMAL LOW (ref 4.22–5.81)
RDW: 15.8 % — ABNORMAL HIGH (ref 11.5–15.5)
WBC: 9.7 10*3/uL (ref 4.0–10.5)
nRBC: 0 % (ref 0.0–0.2)

## 2022-03-03 LAB — RAD ONC ARIA SESSION SUMMARY
Course Elapsed Days: 4
Plan Fractions Treated to Date: 5
Plan Prescribed Dose Per Fraction: 3 Gy
Plan Total Fractions Prescribed: 10
Plan Total Prescribed Dose: 30 Gy
Reference Point Dosage Given to Date: 15 Gy
Reference Point Session Dosage Given: 3 Gy
Session Number: 5

## 2022-03-03 LAB — COMPREHENSIVE METABOLIC PANEL
ALT: 62 U/L — ABNORMAL HIGH (ref 0–44)
AST: 44 U/L — ABNORMAL HIGH (ref 15–41)
Albumin: 3.2 g/dL — ABNORMAL LOW (ref 3.5–5.0)
Alkaline Phosphatase: 83 U/L (ref 38–126)
Anion gap: 6 (ref 5–15)
BUN: 25 mg/dL — ABNORMAL HIGH (ref 8–23)
CO2: 30 mmol/L (ref 22–32)
Calcium: 9.1 mg/dL (ref 8.9–10.3)
Chloride: 100 mmol/L (ref 98–111)
Creatinine, Ser: 0.89 mg/dL (ref 0.61–1.24)
GFR, Estimated: >60 mL/min (ref >60)
Glucose, Bld: 95 mg/dL (ref 70–99)
Potassium: 3.6 mmol/L (ref 3.5–5.1)
Sodium: 136 mmol/L (ref 135–145)
Total Bilirubin: 1.1 mg/dL (ref 0.3–1.2)
Total Protein: 6.8 g/dL (ref 6.5–8.1)

## 2022-03-03 NOTE — Progress Notes (Signed)
Symptom Management [place] at [facility] Telephone:([phone]) [phone] Fax:(336) [phone]  Patient Care Team: [name], MD as PCP - General (Infectious Diseases) Cammie Sickle, MD as Consulting Physician (Oncology)   NAME OF PATIENT: Christopher [name]  [id number]  03-10-1928   DATE OF VISIT: 03/03/22  REASON FOR CONSULT: Christopher [name] is a 88 y.o. male with multiple medical problems including history of peritoneal/omental thickening of unclear etiology also with recurrent high-grade bladder cancer status post XRT at [facility].  PET scan February 17, 2022 revealed hypermetabolic metastatic osseous lesions, left supraclavicular lymph node involvement, right upper lobe, and hypermetabolic abdominal retroperitoneal adenopathy.  Patient was not felt to be a candidate for systemic chemotherapy given his age.  He had persistent lumbar back pain likely secondary to metastatic lesions.  He was referred to XRT for that.  INTERVAL HISTORY: Patient has been seen recently in [facility] on 8/16 and 8/21 for dizziness with orthostasis and poor oral intake.  He received IV fluids.  Today, patient returns to [facility] for follow-up.  Patient reports that he is feeling improved.  He denies any dizziness or unsteadiness.  No symptomatic complaints at present.  Denies any neurologic complaints. Denies recent fevers or illnesses. Denies any easy bleeding or bruising.  Denies chest pain. Denies any nausea, vomiting, area.  Has had constipation.  Denies urinary complaints. Patient offers no further specific complaints today.  PAST MEDICAL HISTORY: Past Medical History:  Diagnosis Date   A-fib ([place])    Anemia    Arthritis    BACK   Bladder cancer (HCC)    BPH (benign prostatic hyperplasia)    Cancer (HCC)    skin cancer on face carcinoma   ED (erectile dysfunction)    Rosanna Randy disease    HLD (hyperlipidemia)    Hypertension    Macular degeneration    [place]  contagiosa    Osteopenia    Presence of permanent cardiac pacemaker    Sensorineural hearing loss    Squamous cell carcinoma of skin 08/25/2019   R cheek    Squamous cell carcinoma of skin 02/12/2020   L neck    Squamous cell carcinoma of skin 11/30/2020   Right elbow, [facility]   Squamous cell carcinoma of skin 03/16/2021   right base of thumb [facility]   Squamous cell carcinoma of skin 03/16/2021   right preauricular - [facility] 11/17/21   Squamous cell carcinoma of skin 10/05/2021   Right dorsum hand - [facility]   Squamous cell carcinoma of skin 11/22/2021   right inf preauricular anterior, [facility]   Squamous cell carcinoma of skin 11/22/2021   right inf preauricular posterior, [facility]    PAST SURGICAL HISTORY:  Past Surgical History:  Procedure Laterality Date   CATARACT EXTRACTION Bilateral    CYSTOSCOPY WITH BIOPSY N/A 01/20/2020   Procedure: CYSTOSCOPY WITH BIOPSY;  Surgeon: Abbie Sons, MD;  Location: [facility] ORS;  Service: Urology;  Laterality: N/A;   CYSTOSCOPY WITH FULGERATION N/A 01/20/2020   Procedure: CYSTOSCOPY WITH FULGERATION;  Surgeon: Abbie Sons, MD;  Location: [facility] ORS;  Service: Urology;  Laterality: N/A;   CYSTOSCOPY WITH FULGERATION N/A 04/19/2020   Procedure: [place] with clot evacuation;  Surgeon: Abbie Sons, MD;  Location: [facility] OR[facility];  Service: Urology;  Laterality: N/A;   CYSTOSCOPY WITH FULGERATION N/A 01/05/2021   Procedure: CYSTOSCOPY WITH FULGERATION;  Surgeon: Abbie Sons, MD;  Location: [facility] OR[facility];  Service: Urology;  Laterality: N/A;   HEMORRHOIDECTOMY  WITH HEMORRHOID BANDING     KNEE ARTHROSCOPY Left    PACEMAKER INSERTION N/A 03/03/2015   Procedure: INSERTION DUAL LEAD PACEMAKER;  Surgeon: Isaias Cowman, MD;  Location: [facility] ORS;  Service: Cardiovascular;  Laterality: N/A;   PULSE GENERATOR IMPLANT N/A 10/18/2020   Procedure: UNILATERAL PULSE GENERATOR IMPLANT;  Surgeon: [name], MD;  Location: [facility] OR[facility];  Service: Neurosurgery;   Laterality: N/A;   SPINAL CORD STIMULATOR TRIAL N/A 10/11/2020   Procedure: PERCUTANEOUS SPINAL CORD STIMULATOR TRIAL;  Surgeon: [name], MD;  Location: [facility];  Service: Neurosurgery;  Laterality: N/A;   THORACIC LAMINECTOMY FOR SPINAL CORD STIMULATOR N/A 10/18/2020   Procedure: PERCUTANEOUS LEAD PLACEMENT;  Surgeon: [name], MD;  Location: [facility];  Service: Neurosurgery;  Laterality: N/A;   TONSILLECTOMY     TRANSURETHRAL RESECTION OF BLADDER TUMOR N/A 10/07/2019   Procedure: TRANSURETHRAL RESECTION OF BLADDER TUMOR (TURBT);  Surgeon: Abbie Sons, MD;  Location: [facility];  Service: Urology;  Laterality: N/A;   TRANSURETHRAL RESECTION OF BLADDER TUMOR N/A 04/13/2020   Procedure: TRANSURETHRAL RESECTION OF BLADDER TUMOR (TURBT);  Surgeon: Abbie Sons, MD;  Location: [facility] OR[facility];  Service: Urology;  Laterality: N/A;    HEMATOLOGY/ONCOLOGY HISTORY:  Oncology History Overview Note  # NOV 2020- [incidental/hematuria work-up]-subtle peritoneal thickening/plaque-like lesions; March 2021-CT scan progressive omental/peritoneal thickening  #Microscopic hematuria- sec to BPH [cystoscopy/CT urogram negative; Dr.Stoiff]; March 2021 CT scan-new lesion in the bladder diverticulum  # MAY-June 2021- severe IDA sec to hemauria [hb 8.5; Ferritin- ]  #July 2022-hemorrhagic shock/bladder cancer-s/p radiation [facility]; no chemotherapy.[Dr.Neilsen]  # AUG 11th, 2023- PET scan- Hypermetabolic metastatic osseous lesions, left supraclavicular lymph nodes, right upper lobe nodule and abdominal retroperitoneal adenopathy-  # on eliquis ? A.fib/pacemaker;   Urothelial carcinoma of bladder ([place])  10/29/2019 Initial Diagnosis   Urothelial carcinoma of bladder ([place])   Cancer of overlapping sites of bladder ([place])  02/17/2022 Initial Diagnosis   Cancer of overlapping sites of bladder ([place])   02/17/2022 Cancer Staging   Staging form: Urinary Bladder, AJCC 8th Edition - Clinical: Stage Unknown (cTX, cN2,  cM1) - Signed by Cammie Sickle, MD on 02/17/2022   03/02/2022 -  Chemotherapy   Patient is on Treatment Plan : BLADDER Pembrolizumab (200) q21d       ALLERGIES:  is allergic to atenolol.  MEDICATIONS:  Current Outpatient Medications  Medication Sig Dispense Refill   atorvastatin (LIPITOR) 10 MG tablet Take 10 mg by mouth in the morning.     calcium carbonate (OSCAL) 1500 (600 Ca) MG TABS tablet Take 600 mg of elemental calcium by mouth in the morning.     Cholecalciferol (VITAMIN D3) 50 MCG (2000 UT) TABS Take 2,000 Units by mouth in the morning.     diltiazem (CARDIZEM CD) 240 MG 24 hr capsule Take 1 capsule (240 mg total) by mouth daily. 30 capsule 0   ferrous sulfate 325 (65 FE) MG tablet Take 325 mg by mouth in the morning and at bedtime.     finasteride (PROSCAR) 5 MG tablet TAKE 1 TABLET DAILY (Patient taking differently: Take 5 mg by mouth daily.) 90 tablet 3   HYDROcodone-acetaminophen (NORCO/VICODIN) 5-325 MG tablet Take 1 tablet by mouth every 6 (six) hours as needed for moderate pain. 45 tablet 0   metoprolol tartrate (LOPRESSOR) 50 MG tablet Take 1 tablet (50 mg total) by mouth 2 (two) times daily. 60 tablet 0   Multiple Vitamin (MULTIVITAMIN WITH MINERALS) TABS tablet Take 1 tablet by mouth  in the morning.     Multiple Vitamins-Minerals (PRESERVISION AREDS 2 PO) Take 1 tablet by mouth in the morning and at bedtime.     Omega-3 Fatty Acids (FISH OIL) 1000 MG CAPS Take 1,000 mg by mouth daily.     predniSONE (DELTASONE) 20 MG tablet Take 2 pills a day for 1 week; and then 1 pill a day for 1 week.  Taking the morning with food. 40 tablet 0   tamsulosin (FLOMAX) 0.4 MG CAPS capsule Take 0.4 mg by mouth daily.     ELIQUIS 5 MG TABS tablet Take 1 tablet by mouth daily.     No current facility-administered medications for this visit.    VITAL SIGNS: BP 122/64   Pulse (!) 59   Temp (!) 95.9 F (35.5 C) (Tympanic)   Resp 20   Ht '5\' 11"'$  (1.803 m)   Wt 162 lb (73.5 kg)    BMI 22.59 kg/m  Filed Weights   03/03/22 1046  Weight: 162 lb (73.5 kg)    Estimated body mass index is 22.59 kg/m as calculated from the following:   Height as of this encounter: '5\' 11"'$  (1.803 m).   Weight as of this encounter: 162 lb (73.5 kg).  LABS: CBC:    Component Value Date/Time   WBC 9.7 03/03/2022 1021   HGB 12.9 (L) 03/03/2022 1021   HGB 13.2 01/13/2020 1412   HCT 38.8 (L) 03/03/2022 1021   HCT 39.5 01/13/2020 1412   PLT 256 03/03/2022 1021   PLT 222 01/13/2020 1412   MCV 94.9 03/03/2022 1021   MCV 90 01/13/2020 1412   NEUTROABS 7.7 03/03/2022 1021   NEUTROABS 3.6 01/13/2020 1412   LYMPHSABS 0.9 03/03/2022 1021   LYMPHSABS 1.1 01/13/2020 1412   MONOABS 1.1 (H) 03/03/2022 1021   EOSABS 0.0 03/03/2022 1021   EOSABS 0.1 01/13/2020 1412   BASOSABS 0.0 03/03/2022 1021   BASOSABS 0.0 01/13/2020 1412   Comprehensive Metabolic Panel:    Component Value Date/Time   NA 136 03/03/2022 1021   NA 141 01/13/2020 1412   K 3.6 03/03/2022 1021   K 4.4 08/10/2011 1443   CL 100 03/03/2022 1021   CO2 30 03/03/2022 1021   BUN 25 (H) 03/03/2022 1021   BUN 18 01/13/2020 1412   CREATININE 0.89 03/03/2022 1021   GLUCOSE 95 03/03/2022 1021   CALCIUM 9.1 03/03/2022 1021   AST 44 (H) 03/03/2022 1021   ALT 62 (H) 03/03/2022 1021   ALKPHOS 83 03/03/2022 1021   BILITOT 1.1 03/03/2022 1021   PROT 6.8 03/03/2022 1021   ALBUMIN 3.2 (L) 03/03/2022 1021    RADIOGRAPHIC STUDIES: [place] CORE BIOPSY (LYMPH NODES)  Result Date: 02/23/2022 INDICATION: Lymphadenopathy EXAM: Ultrasound-guided core needle biopsy of left supraclavicular lymph node MEDICATIONS: None. ANESTHESIA/SEDATION: Local analgesia COMPLICATIONS: None immediate. PROCEDURE: Informed written consent was obtained from the patient after a thorough discussion of the procedural risks, benefits and alternatives. All questions were addressed. Maximal Sterile Barrier Technique was utilized including caps, mask, sterile gowns, sterile  gloves, sterile drape, hand hygiene and skin antiseptic. A timeout was performed prior to the initiation of the procedure. The patient was placed supine on the exam table. Ultrasound of the left supraclavicular region demonstrated abnormally enlarged hypoechoic lymph node, consistent with recent PET-CT imaging. Skin entry site was marked, and the overlying skin was prepped draped in the standard sterile fashion. Local analgesia was obtained with 1% lidocaine. Using ultrasound guidance, core needle biopsy was performed of the left  supraclavicular lymph node using an 18 gauge core biopsy device x6 total passes. Specimens were submitted in saline to pathology for further handling. Limited postprocedure imaging demonstrated no hematoma. A clean dressing was placed after manual hemostasis. The patient tolerated the procedure well without immediate complication. IMPRESSION: Successful ultrasound-guided core needle biopsy of left supraclavicular lymph node. Electronically Signed   By: Albin Felling M.D.   On: 02/23/2022 15:17   NM PET Image Restag (PS) Skull Base To Thigh  Result Date: 02/16/2022 CLINICAL DATA:  Initial treatment strategy for peritoneal carcinomatosis. EXAM: NUCLEAR MEDICINE PET SKULL BASE TO THIGH TECHNIQUE: 8.7 mCi F-18 FDG was injected intravenously. Full-ring PET imaging was performed from the skull base to thigh after the radiotracer. CT data was obtained and used for attenuation correction and anatomic localization. Fasting blood glucose: 119 mg/dl COMPARISON:  CT abdomen pelvis 01/24/2022. FINDINGS: Mediastinal blood pool activity: SUV max 1.7 Liver activity: SUV max NA NECK: No hypermetabolic adenopathy. Incidental CT findings: None. CHEST: Hypermetabolic left supraclavicular lymph nodes measure up to 7 mm (2/72), SUV max 5.8. 8 mm peripheral right upper lobe nodule (2/85), SUV max 3.9. Possible hypermetabolic right hilar lymph node, SUV max 2.7. Comparison with CT is challenging without IV  contrast. No hypermetabolic mediastinal or axillary lymph nodes. Incidental CT findings: Atherosclerotic calcification of the aorta, aortic valve and coronary arteries. Heart is enlarged. No pericardial or pleural effusion. There may be mild coarsening of the pulmonary markings. ABDOMEN/PELVIS: Periaortic lymph nodes measure up to 1.3 cm in the left periaortic station (2/156), SUV max 5.5. No abnormal hypermetabolism in the liver, adrenal glands, spleen or pancreas. No abnormal peritoneal hypermetabolism. Incidental CT findings: Liver, gallbladder, adrenal glands, kidneys, spleen, pancreas, stomach and bowel are grossly unremarkable. Atherosclerotic calcification of the aorta. Left posterolateral bladder diverticulum contains a tiny stone. Mild perivesical haziness. Previously questioned omental thickening is not well visualized. SKELETON: Multiple hypermetabolic lesions are seen in the visualized osseous structures. Index hypermetabolism in the L1 vertebral body, SUV max 10.3, corresponds to a 1.7 cm lytic lesion. Incidental CT findings: Degenerative changes in the spine. Spinal stimulator wires in place. IMPRESSION: 1. Hypermetabolic metastatic osseous lesions, left supraclavicular lymph nodes, right upper lobe nodule and abdominal retroperitoneal adenopathy, possibly related to the patient's known diagnosis of bladder cancer. Melanoma not excluded. 2. Omental thickening seen on 01/24/2022 is no longer appreciated. 3. Left posterolateral bladder diverticulum with a tiny stone. Mild perivesical haziness, possibly treatment related or due to cystitis. 4. Aortic atherosclerosis (ICD10-I70.0). Coronary artery calcification. Electronically Signed   By: Lorin Picket M.D.   On: 02/16/2022 14:32    PERFORMANCE STATUS (ECOG) : 3 - Symptomatic, >50% confined to bed  Review of Systems Unless otherwise noted, a complete review of systems is negative.  Physical Exam General: NAD Cardiovascular: regular rate and  rhythm Pulmonary: clear ant fields Abdomen: soft, nontender, + bowel sounds GU: no suprapubic tenderness Extremities: no edema, no joint deformities Skin: no rashes Neurological: Weakness but otherwise nonfocal  IMPRESSION/PLAN: Weakness/dizziness -vitals stable today.  Symptoms are improved.  Continue pushing oral fluids.  Patient can return as needed to [facility] for labs/fluids.  He does have pending follow-up with cardiology to discuss metoprolol and Cardizem dosing.  Neoplasm related pain-patient is on XRT.  Stable.  Continue Norco.    RTC next week to see Dr. Rogue Bussing  Patient expressed understanding and was in agreement with this plan. He also understands that He can call clinic at any time with any questions, concerns, or  complaints.   Thank you for allowing me to participate in the care of this very pleasant patient.   Time Total: 15 minutes  Visit consisted of counseling and education dealing with the complex and emotionally intense issues of symptom management in the setting of serious illness.Greater than 50%  of this time was spent counseling and coordinating care related to the above assessment and plan.  Signed by: Altha Harm, PhD, NP-C

## 2022-03-06 ENCOUNTER — Other Ambulatory Visit: Payer: Self-pay

## 2022-03-06 ENCOUNTER — Inpatient Hospital Stay: Payer: Medicare HMO

## 2022-03-06 ENCOUNTER — Encounter: Payer: Self-pay | Admitting: Internal Medicine

## 2022-03-06 ENCOUNTER — Ambulatory Visit
Admission: RE | Admit: 2022-03-06 | Discharge: 2022-03-06 | Disposition: A | Discharge status: Home / Self Care | Payer: Medicare HMO | Source: Ambulatory Visit | Attending: Radiation Oncology | Admitting: Radiation Oncology

## 2022-03-06 ENCOUNTER — Inpatient Hospital Stay (HOSPITAL_BASED_OUTPATIENT_CLINIC_OR_DEPARTMENT_OTHER): Payer: Medicare HMO | Admitting: Internal Medicine

## 2022-03-06 VITALS — BP 104/62 | HR 62 | Temp 97.1°F | Resp 16 | Wt 159.0 lb

## 2022-03-06 DIAGNOSIS — R42 Dizziness and giddiness: Secondary | ICD-10-CM

## 2022-03-06 DIAGNOSIS — Z510 Encounter for antineoplastic radiation therapy: Secondary | ICD-10-CM | POA: Diagnosis not present

## 2022-03-06 DIAGNOSIS — C678 Malignant neoplasm of overlapping sites of bladder: Secondary | ICD-10-CM | POA: Diagnosis not present

## 2022-03-06 LAB — RAD ONC ARIA SESSION SUMMARY
Course Elapsed Days: 7
Plan Fractions Treated to Date: 6
Plan Prescribed Dose Per Fraction: 3 Gy
Plan Total Fractions Prescribed: 10
Plan Total Prescribed Dose: 30 Gy
Reference Point Dosage Given to Date: 18 Gy
Reference Point Session Dosage Given: 3 Gy
Session Number: 6

## 2022-03-06 LAB — CBC WITH DIFFERENTIAL/PLATELET
Abs Immature Granulocytes: 0.1 10*3/uL — ABNORMAL HIGH (ref 0.00–0.07)
Basophils Absolute: 0 10*3/uL (ref 0.0–0.1)
Basophils Relative: 0 %
Eosinophils Absolute: 0.1 10*3/uL (ref 0.0–0.5)
Eosinophils Relative: 1 %
HCT: 41.2 % (ref 39.0–52.0)
Hemoglobin: 13.6 g/dL (ref 13.0–17.0)
Immature Granulocytes: 1 %
Lymphocytes Relative: 7 %
Lymphs Abs: 0.7 10*3/uL (ref 0.7–4.0)
MCH: 31.7 pg (ref 26.0–34.0)
MCHC: 33 g/dL (ref 30.0–36.0)
MCV: 96 fL (ref 80.0–100.0)
Monocytes Absolute: 0.8 10*3/uL (ref 0.1–1.0)
Monocytes Relative: 8 %
Neutro Abs: 8.6 10*3/uL — ABNORMAL HIGH (ref 1.7–7.7)
Neutrophils Relative %: 83 %
Platelets: 264 10*3/uL (ref 150–400)
RBC: 4.29 MIL/uL (ref 4.22–5.81)
RDW: 16.2 % — ABNORMAL HIGH (ref 11.5–15.5)
WBC: 10.2 10*3/uL (ref 4.0–10.5)
nRBC: 0 % (ref 0.0–0.2)

## 2022-03-06 LAB — COMPREHENSIVE METABOLIC PANEL
ALT: 83 U/L — ABNORMAL HIGH (ref 0–44)
AST: 42 U/L — ABNORMAL HIGH (ref 15–41)
Albumin: 3.1 g/dL — ABNORMAL LOW (ref 3.5–5.0)
Alkaline Phosphatase: 86 U/L (ref 38–126)
Anion gap: 6 (ref 5–15)
BUN: 27 mg/dL — ABNORMAL HIGH (ref 8–23)
CO2: 31 mmol/L (ref 22–32)
Calcium: 9.2 mg/dL (ref 8.9–10.3)
Chloride: 102 mmol/L (ref 98–111)
Creatinine, Ser: 0.93 mg/dL (ref 0.61–1.24)
GFR, Estimated: >60 mL/min (ref >60)
Glucose, Bld: 149 mg/dL — ABNORMAL HIGH (ref 70–99)
Potassium: 3.9 mmol/L (ref 3.5–5.1)
Sodium: 139 mmol/L (ref 135–145)
Total Bilirubin: 0.8 mg/dL (ref 0.3–1.2)
Total Protein: 6.7 g/dL (ref 6.5–8.1)

## 2022-03-06 MED ORDER — SODIUM CHLORIDE 0.9 % IV SOLN
INTRAVENOUS | Status: DC
  Filled 2022-03-06 (×2): qty 250

## 2022-03-06 NOTE — Progress Notes (Signed)
[place] NOTE  Patient Care Team: [name], MD as PCP - General (Infectious Diseases) Cammie Sickle, MD as Consulting Physician (Oncology)  CHIEF COMPLAINTS/PURPOSE OF CONSULTATION: Bladder cancer  Oncology History Overview Note  # NOV 2020- [incidental/hematuria work-up]-subtle peritoneal thickening/plaque-like lesions; March 2021-CT scan progressive omental/peritoneal thickening  #Microscopic hematuria- sec to BPH [cystoscopy/CT urogram negative; Dr.Stoiff]; March 2021 CT scan-new lesion in the bladder diverticulum  # MAY-June 2021- severe IDA sec to hemauria [hb 8.5; Ferritin- ]  #July 2022-hemorrhagic shock/bladder cancer-s/p radiation [facility]; no chemotherapy.[Dr.Neilsen]  # AUG 11th, 2023- PET scan- Hypermetabolic metastatic osseous lesions, left supraclavicular lymph nodes, right upper lobe nodule and abdominal retroperitoneal adenopathy- BIOPSY- POSITIVE FOR MALIGNANCY; QNS for IHC.   # on eliquis ? A.fib/pacemaker;   Urothelial carcinoma of bladder ([place])  10/29/2019 Initial Diagnosis   Urothelial carcinoma of bladder (HCC)   Cancer of overlapping sites of bladder ([place])  02/17/2022 Initial Diagnosis   Cancer of overlapping sites of bladder ([place])   02/17/2022 Cancer Staging   Staging form: Urinary Bladder, AJCC 8th Edition - Clinical: Stage Unknown (cTX, cN2, cM1) - Signed by Cammie Sickle, MD on 02/17/2022   03/16/2022 -  Chemotherapy   Patient is on Treatment Plan : BLADDER Pembrolizumab (200) q21d        HISTORY OF PRESENTING ILLNESS: pt ambulating IN A WHEEL CHAIR; PT is accompanied by his daughter  SIMRAN MANNIS 88 y.o.  male with a history of peritoneal/omental thickening of unclear etiology; also recurrent bladder malignancy/hematuria-currently s/p radiation at [facility] here to review the results of his lymph node biopsy.  In the interim patient underwent supraclavicular left lymph node biopsy.  It was  uneventful.  He continues to complain dizziness.  Also complains of overall poor appetite.  Progressive weight loss.  Complains of low back pain.  Had a cortisone once a day is not helping.  Constipation alternating with diarrhea.  Patient denies any blood in stools.   Review of Systems  Constitutional:  Positive for malaise/fatigue and weight loss. Negative for chills, diaphoresis and fever.  HENT:  Negative for nosebleeds and sore throat.   Eyes:  Negative for double vision.  Respiratory:  Negative for cough, hemoptysis, sputum production and wheezing.   Cardiovascular:  Negative for chest pain, palpitations, orthopnea and leg swelling.  Gastrointestinal:  Positive for constipation. Negative for abdominal pain, blood in stool, diarrhea, heartburn, melena, nausea and vomiting.  Genitourinary:  Positive for hematuria. Negative for dysuria, frequency and urgency.  Musculoskeletal:  Positive for back pain and joint pain.  Skin: Negative.  Negative for itching and rash.  Neurological:  Negative for dizziness, tingling, focal weakness, weakness and headaches.  Endo/Heme/Allergies:  Does not bruise/bleed easily.  Psychiatric/Behavioral:  Negative for depression. The patient is not nervous/anxious and does not have insomnia.      MEDICAL HISTORY:  Past Medical History:  Diagnosis Date   A-fib ([place])    Anemia    Arthritis    BACK   Bladder cancer (HCC)    BPH (benign prostatic hyperplasia)    Cancer (HCC)    skin cancer on face carcinoma   ED (erectile dysfunction)    Rosanna Randy disease    HLD (hyperlipidemia)    Hypertension    Macular degeneration    Mollusca contagiosa    Osteopenia    Presence of permanent cardiac pacemaker    Sensorineural hearing loss    Squamous cell carcinoma of skin 08/25/2019   R  cheek    Squamous cell carcinoma of skin 02/12/2020   L neck    Squamous cell carcinoma of skin 11/30/2020   Right elbow, [facility]   Squamous cell carcinoma of skin 03/16/2021    right base of thumb [facility]   Squamous cell carcinoma of skin 03/16/2021   right preauricular - [facility] 11/17/21   Squamous cell carcinoma of skin 10/05/2021   Right dorsum hand - [facility]   Squamous cell carcinoma of skin 11/22/2021   right inf preauricular anterior, [facility]   Squamous cell carcinoma of skin 11/22/2021   right inf preauricular posterior, [facility]    SURGICAL HISTORY: Past Surgical History:  Procedure Laterality Date   CATARACT EXTRACTION Bilateral    CYSTOSCOPY WITH BIOPSY N/A 01/20/2020   Procedure: CYSTOSCOPY WITH BIOPSY;  Surgeon: Abbie Sons, MD;  Location: [facility];  Service: Urology;  Laterality: N/A;   CYSTOSCOPY WITH FULGERATION N/A 01/20/2020   Procedure: CYSTOSCOPY WITH FULGERATION;  Surgeon: Abbie Sons, MD;  Location: [facility];  Service: Urology;  Laterality: N/A;   CYSTOSCOPY WITH FULGERATION N/A 04/19/2020   Procedure: [place] with clot evacuation;  Surgeon: Abbie Sons, MD;  Location: [facility];  Service: Urology;  Laterality: N/A;   CYSTOSCOPY WITH FULGERATION N/A 01/05/2021   Procedure: CYSTOSCOPY WITH FULGERATION;  Surgeon: Abbie Sons, MD;  Location: [facility];  Service: Urology;  Laterality: N/A;   HEMORRHOIDECTOMY WITH HEMORRHOID BANDING     KNEE ARTHROSCOPY Left    PACEMAKER INSERTION N/A 03/03/2015   Procedure: INSERTION DUAL LEAD PACEMAKER;  Surgeon: Isaias Cowman, MD;  Location: [facility];  Service: Cardiovascular;  Laterality: N/A;   PULSE GENERATOR IMPLANT N/A 10/18/2020   Procedure: UNILATERAL PULSE GENERATOR IMPLANT;  Surgeon: [name], MD;  Location: [facility];  Service: Neurosurgery;  Laterality: N/A;   SPINAL CORD STIMULATOR TRIAL N/A 10/11/2020   Procedure: PERCUTANEOUS SPINAL CORD STIMULATOR TRIAL;  Surgeon: [name], MD;  Location: [facility];  Service: Neurosurgery;  Laterality: N/A;   THORACIC LAMINECTOMY FOR SPINAL CORD STIMULATOR N/A 10/18/2020   Procedure: PERCUTANEOUS LEAD PLACEMENT;  Surgeon: [name],  MD;  Location: [facility];  Service: Neurosurgery;  Laterality: N/A;   TONSILLECTOMY     TRANSURETHRAL RESECTION OF BLADDER TUMOR N/A 10/07/2019   Procedure: TRANSURETHRAL RESECTION OF BLADDER TUMOR (TURBT);  Surgeon: Abbie Sons, MD;  Location: [facility];  Service: Urology;  Laterality: N/A;   TRANSURETHRAL RESECTION OF BLADDER TUMOR N/A 04/13/2020   Procedure: TRANSURETHRAL RESECTION OF BLADDER TUMOR (TURBT);  Surgeon: Abbie Sons, MD;  Location: [facility];  Service: Urology;  Laterality: N/A;    SOCIAL HISTORY: Social History   Socioeconomic History   Marital status: Widowed    Spouse name: Not on file   Number of children: Not on file   Years of education: Not on file   Highest education level: Not on file  Occupational History   Not on file  Tobacco Use   Smoking status: Never   Smokeless tobacco: Never  Vaping Use   Vaping Use: Never used  Substance and Sexual Activity   Alcohol use: Yes    Alcohol/week: 7.0 standard drinks of alcohol    Types: 7 Glasses of wine per week    Comment: DAILY    Drug use: No   Sexual activity: Yes    Birth control/protection: None  Other Topics Concern   Not on file  Social History Narrative   Retd. Civil engineer, contracting; [place]; self. Never smoked; alcohol-  glass wine/drink every night.     Social Determinants of Health   Financial Resource Strain: Not on file  Food Insecurity: Not on file  Transportation Needs: Not on file  Physical Activity: Not on file  Stress: Not on file  Social Connections: Not on file  Intimate Partner Violence: Not on file    FAMILY HISTORY: Family History  Problem Relation Age of Onset   Diabetes Mother    Lung cancer Father    Cancer Paternal Uncle    Prostate cancer Neg Hx    Bladder Cancer Neg Hx    Kidney cancer Neg Hx     ALLERGIES:  is allergic to atenolol.  MEDICATIONS:  Current Outpatient Medications  Medication Sig Dispense Refill   atorvastatin (LIPITOR) 10 MG tablet Take 10 mg  by mouth in the morning.     calcium carbonate (OSCAL) 1500 (600 Ca) MG TABS tablet Take 600 mg of elemental calcium by mouth in the morning.     Cholecalciferol (VITAMIN D3) 50 MCG (2000 UT) TABS Take 2,000 Units by mouth in the morning.     diltiazem (CARDIZEM CD) 240 MG 24 hr capsule Take 1 capsule (240 mg total) by mouth daily. 30 capsule 0   ELIQUIS 5 MG TABS tablet Take 1 tablet by mouth daily.     ferrous sulfate 325 (65 FE) MG tablet Take 325 mg by mouth in the morning and at bedtime.     finasteride (PROSCAR) 5 MG tablet TAKE 1 TABLET DAILY (Patient taking differently: Take 5 mg by mouth daily.) 90 tablet 3   HYDROcodone-acetaminophen (NORCO/VICODIN) 5-325 MG tablet Take 1 tablet by mouth every 6 (six) hours as needed for moderate pain. 45 tablet 0   metoprolol tartrate (LOPRESSOR) 50 MG tablet Take 1 tablet (50 mg total) by mouth 2 (two) times daily. 60 tablet 0   Multiple Vitamin (MULTIVITAMIN WITH MINERALS) TABS tablet Take 1 tablet by mouth in the morning.     Multiple Vitamins-Minerals (PRESERVISION AREDS 2 PO) Take 1 tablet by mouth in the morning and at bedtime.     Omega-3 Fatty Acids (FISH OIL) 1000 MG CAPS Take 1,000 mg by mouth daily.     predniSONE (DELTASONE) 20 MG tablet Take 2 pills a day for 1 week; and then 1 pill a day for 1 week.  Taking the morning with food. 40 tablet 0   tamsulosin (FLOMAX) 0.4 MG CAPS capsule Take 0.4 mg by mouth daily.     No current facility-administered medications for this visit.   Facility-Administered Medications Ordered in Other Visits  Medication Dose Route Frequency Provider Last Rate Last Admin   0.9 %  sodium chloride infusion   Intravenous Continuous Cammie Sickle, MD   Stopped at 03/06/22 1321      .  PHYSICAL EXAMINATION: ECOG PERFORMANCE STATUS: 0 - Asymptomatic  Vitals:   03/06/22 1000  BP: 104/62  Pulse: 62  Resp: 16  Temp: (!) 97.1 F (36.2 C)  SpO2: 99%    Filed Weights   03/06/22 1000  Weight: 159 lb  (72.1 kg)   Frail-appearing Caucasian male patient.   Physical Exam HENT:     Head: Normocephalic and atraumatic.     Mouth/Throat:     Pharynx: No oropharyngeal exudate.  Eyes:     Pupils: Pupils are equal, round, and reactive to light.  Cardiovascular:     Rate and Rhythm: Normal rate and regular rhythm.  Pulmonary:     Effort: No respiratory  distress.     Breath sounds: No wheezing.  Abdominal:     General: Bowel sounds are normal. There is no distension.     Palpations: Abdomen is soft. There is no mass.     Tenderness: There is no abdominal tenderness. There is no guarding or rebound.  Musculoskeletal:        General: No tenderness. Normal range of motion.     Cervical back: Normal range of motion and neck supple.  Skin:    General: Skin is warm.  Neurological:     Mental Status: He is alert and oriented to person, place, and time.  Psychiatric:        Mood and Affect: Affect normal.     LABORATORY DATA:  I have reviewed the data as listed Lab Results  Component Value Date   WBC 10.2 03/06/2022   HGB 13.6 03/06/2022   HCT 41.2 03/06/2022   MCV 96.0 03/06/2022   PLT 264 03/06/2022   Recent Labs    02/27/22 1153 03/01/22 1333 03/03/22 1021 03/06/22 0949  NA 138 137 136 139  K 4.0 3.6 3.6 3.9  CL 100 99 100 102  CO2 32 '30 30 31  '$ GLUCOSE 99 122* 95 149*  BUN 27* 30* 25* 27*  CREATININE 0.97 0.91 0.89 0.93  CALCIUM 9.8 9.8 9.1 9.2  GFRNONAA >60 >60 >60 >60  PROT 7.3  --  6.8 6.7  ALBUMIN 3.3*  --  3.2* 3.1*  AST 40  --  44* 42*  ALT 50*  --  62* 83*  ALKPHOS 86  --  83 86  BILITOT 1.0  --  1.1 0.8    RADIOGRAPHIC STUDIES: I have personally reviewed the radiological images as listed and agreed with the findings in the report. [place] CORE BIOPSY (LYMPH NODES)  Result Date: 02/23/2022 INDICATION: Lymphadenopathy EXAM: Ultrasound-guided core needle biopsy of left supraclavicular lymph node MEDICATIONS: None. ANESTHESIA/SEDATION: Local analgesia  COMPLICATIONS: None immediate. PROCEDURE: Informed written consent was obtained from the patient after a thorough discussion of the procedural risks, benefits and alternatives. All questions were addressed. Maximal Sterile Barrier Technique was utilized including caps, mask, sterile gowns, sterile gloves, sterile drape, hand hygiene and skin antiseptic. A timeout was performed prior to the initiation of the procedure. The patient was placed supine on the exam table. Ultrasound of the left supraclavicular region demonstrated abnormally enlarged hypoechoic lymph node, consistent with recent PET-CT imaging. Skin entry site was marked, and the overlying skin was prepped draped in the standard sterile fashion. Local analgesia was obtained with 1% lidocaine. Using ultrasound guidance, core needle biopsy was performed of the left supraclavicular lymph node using an 18 gauge core biopsy device x6 total passes. Specimens were submitted in saline to pathology for further handling. Limited postprocedure imaging demonstrated no hematoma. A clean dressing was placed after manual hemostasis. The patient tolerated the procedure well without immediate complication. IMPRESSION: Successful ultrasound-guided core needle biopsy of left supraclavicular lymph node. Electronically Signed   By: Albin Felling M.D.   On: 02/23/2022 15:17   NM PET Image Restag (PS) Skull Base To Thigh  Result Date: 02/16/2022 CLINICAL DATA:  Initial treatment strategy for peritoneal carcinomatosis. EXAM: NUCLEAR MEDICINE PET SKULL BASE TO THIGH TECHNIQUE: 8.7 mCi F-18 FDG was injected intravenously. Full-ring PET imaging was performed from the skull base to thigh after the radiotracer. CT data was obtained and used for attenuation correction and anatomic localization. Fasting blood glucose: 119 mg/dl COMPARISON:  CT abdomen pelvis 01/24/2022.  FINDINGS: Mediastinal blood pool activity: SUV max 1.7 Liver activity: SUV max NA NECK: No hypermetabolic  adenopathy. Incidental CT findings: None. CHEST: Hypermetabolic left supraclavicular lymph nodes measure up to 7 mm (2/72), SUV max 5.8. 8 mm peripheral right upper lobe nodule (2/85), SUV max 3.9. Possible hypermetabolic right hilar lymph node, SUV max 2.7. Comparison with CT is challenging without IV contrast. No hypermetabolic mediastinal or axillary lymph nodes. Incidental CT findings: Atherosclerotic calcification of the aorta, aortic valve and coronary arteries. Heart is enlarged. No pericardial or pleural effusion. There may be mild coarsening of the pulmonary markings. ABDOMEN/PELVIS: Periaortic lymph nodes measure up to 1.3 cm in the left periaortic station (2/156), SUV max 5.5. No abnormal hypermetabolism in the liver, adrenal glands, spleen or pancreas. No abnormal peritoneal hypermetabolism. Incidental CT findings: Liver, gallbladder, adrenal glands, kidneys, spleen, pancreas, stomach and bowel are grossly unremarkable. Atherosclerotic calcification of the aorta. Left posterolateral bladder diverticulum contains a tiny stone. Mild perivesical haziness. Previously questioned omental thickening is not well visualized. SKELETON: Multiple hypermetabolic lesions are seen in the visualized osseous structures. Index hypermetabolism in the L1 vertebral body, SUV max 10.3, corresponds to a 1.7 cm lytic lesion. Incidental CT findings: Degenerative changes in the spine. Spinal stimulator wires in place. IMPRESSION: 1. Hypermetabolic metastatic osseous lesions, left supraclavicular lymph nodes, right upper lobe nodule and abdominal retroperitoneal adenopathy, possibly related to the patient's known diagnosis of bladder cancer. Melanoma not excluded. 2. Omental thickening seen on 01/24/2022 is no longer appreciated. 3. Left posterolateral bladder diverticulum with a tiny stone. Mild perivesical haziness, possibly treatment related or due to cystitis. 4. Aortic atherosclerosis (ICD10-I70.0). Coronary artery  calcification. Electronically Signed   By: Lorin Picket M.D.   On: 02/16/2022 14:32    ASSESSMENT & PLAN:   Cancer of overlapping sites of bladder [facility])  JUNE 2022- CT [facility]- Multiple recurrent high-grade-suspicious for muscle invasive urothelial malignancy; possible extravascular extension of the tumor [biopsy-[facility] negative for muscle invasion].  S/p  radiation at [facility].  S/p cystoscopy- NEG. AUG 11th, 2023- PET scan- Hypermetabolic metastatic osseous lesions, left supraclavicular lymph nodes, right upper lobe nodule and abdominal retroperitoneal adenopathy-highly concerning for malignancy.  Suprclaviulcar LN- biopsy- positive for malignancy; QNS for IHC.  plan.  Discussed with Dr. Ricky Ala, [facility] of the above plan.  In agreement.  #Discussed with the daughters that its is Clinically suggestive metastatic cancer of the bladder.  Patient is not a chemotherapy candidate.  Discussed single agent Keytruda.  I discussed the mechanism of action; The goal of therapy is palliative; and length of treatments are likely ongoing/based upon the results of the scans. Discussed the potential side effects of immunotherapy including but not limited to diarrhea; skin rash; elevated LFTs/endocrine abnormalities etc. The patient and daughter interested.  We will plan to start after radiation.  Last fraction of radiation is on September 1.  # Lumbar back pain-likely secondary to metastatic lesions-currently undergoing radiation oncology for palliative radiation.  Continue hydrocodone twice a day as needed for pain control.  #Iron deficiency anemia secondary to chronic blood loss/hematuria [see below]- S/p  IV Venofer; Today Hb-12; July 2023 iron studies-no significant deficiency.  HOLD venofer for now.  # DISPOSITION: # IVFs 1 lit over 1 hour # chemo education- Keytruda  # Sep 7th, MD; labs- cbc/cmp; TSH; keytruda-[new]; possible IVFs. Over 1 hour-Dr.B  # 40 minutes face-to-face with the patient discussing the above plan  of care; more than 50% of time spent on prognosis/ natural history; counseling and coordination.  #  I reviewed the blood work- with the patient in detail; also reviewed the imaging independently [as summarized above]; and with the patient in detail.        Addendum:       All questions were answered. The patient knows to call the clinic with any problems, questions or concerns.    Cammie Sickle, MD 03/06/2022 5:57 PM

## 2022-03-06 NOTE — Progress Notes (Signed)
Patient feeling dizzy today.  Chronic low back pain feeling worse today, 6/10 pain scale.  3 lb wt loss despite a good appetite and drinks 2 protein shakes a day.    Heart rate is ranging for 67 to 92 (history of A-Fib) on check today, BP 104/62.

## 2022-03-06 NOTE — Assessment & Plan Note (Addendum)
JUNE 2022- CT [facility]- Multiple recurrent high-grade-suspicious for muscle invasive urothelial malignancy; possible extravascular extension of the tumor [biopsy-[facility] negative for muscle invasion].  S/p  radiation at [facility].  S/p cystoscopy- NEG. AUG 11th, 2023- PET scan- Hypermetabolic metastatic osseous lesions, left supraclavicular lymph nodes, right upper lobe nodule and abdominal retroperitoneal adenopathy-highly concerning for malignancy.  Suprclaviulcar LN- biopsy- positive for malignancy; QNS for IHC.  plan.  Discussed with Dr. Ricky Ala, [facility] of the above plan.  In agreement.  #Discussed with the daughters that its is Clinically suggestive metastatic cancer of the bladder.  Patient is not a chemotherapy candidate.  Discussed single agent Keytruda.  I discussed the mechanism of action; The goal of therapy is palliative; and length of treatments are likely ongoing/based upon the results of the scans. Discussed the potential side effects of immunotherapy including but not limited to diarrhea; skin rash; elevated LFTs/endocrine abnormalities etc. The patient and daughter interested.  We will plan to start after radiation.  Last fraction of radiation is on September 1.  # Lumbar back pain-likely secondary to metastatic lesions-currently undergoing radiation oncology for palliative radiation.  Continue hydrocodone twice a day as needed for pain control.  #Iron deficiency anemia secondary to chronic blood loss/hematuria [see below]- S/p  IV Venofer; Today Hb-12; July 2023 iron studies-no significant deficiency.  HOLD venofer for now.  # DISPOSITION: # IVFs 1 lit over 1 hour # chemo education- Keytruda  # Sep 7th, MD; labs- cbc/cmp; TSH; keytruda-[new]; possible IVFs. Over 1 hour-Dr.B  # 40 minutes face-to-face with the patient discussing the above plan of care; more than 50% of time spent on prognosis/ natural history; counseling and coordination.  # I reviewed the blood work- with the patient in detail; also  reviewed the imaging independently [as summarized above]; and with the patient in detail.        Addendum:

## 2022-03-07 ENCOUNTER — Encounter: Payer: Self-pay | Admitting: Internal Medicine

## 2022-03-07 ENCOUNTER — Ambulatory Visit
Admission: RE | Admit: 2022-03-07 | Discharge: 2022-03-07 | Disposition: A | Discharge status: Home / Self Care | Payer: Medicare HMO | Source: Ambulatory Visit | Attending: Radiation Oncology | Admitting: Radiation Oncology

## 2022-03-07 ENCOUNTER — Other Ambulatory Visit: Payer: Medicare HMO | Admitting: Primary Care

## 2022-03-07 ENCOUNTER — Other Ambulatory Visit: Payer: Self-pay

## 2022-03-07 DIAGNOSIS — Z515 Encounter for palliative care: Secondary | ICD-10-CM

## 2022-03-07 DIAGNOSIS — R531 Weakness: Secondary | ICD-10-CM

## 2022-03-07 DIAGNOSIS — C678 Malignant neoplasm of overlapping sites of bladder: Secondary | ICD-10-CM

## 2022-03-07 DIAGNOSIS — Z510 Encounter for antineoplastic radiation therapy: Secondary | ICD-10-CM | POA: Diagnosis not present

## 2022-03-07 LAB — RAD ONC ARIA SESSION SUMMARY
Course Elapsed Days: 8
Plan Fractions Treated to Date: 7
Plan Prescribed Dose Per Fraction: 3 Gy
Plan Total Fractions Prescribed: 10
Plan Total Prescribed Dose: 30 Gy
Reference Point Dosage Given to Date: 21 Gy
Reference Point Session Dosage Given: 3 Gy
Session Number: 7

## 2022-03-07 NOTE — Progress Notes (Signed)
[facility] [facility] Palliative Care Consult Note Telephone: [phone]  Fax: [phone]   Date of encounter: 03/07/22 3:05 PM PATIENT NAME: Christopher [name] Christopher [name] [place] [address]   [phone] (home) [phone] (work) DOB: [birth year] MRN: [id number] PRIMARY CARE PROVIDER:    [name], MD,  [place] [address] [phone]  REFERRING PROVIDER:   Irean Hong, NP [place],  [place] [address] [phone]   RESPONSIBLE PARTY:    Contact Information     Name Relation Home Work [place] Daughter [phone]  [phone]   talmage, teaster Daughter   [phone]        I met face to face with patient and family in  home. Palliative Care was asked to follow this patient by consultation request of  Christopher [name], Christopher Boys, NP  to address advance care planning and complex medical decision making. This is the initial visit.                                     ASSESSMENT AND PLAN / RECOMMENDATIONS:   Advance Care Planning/Goals of Care: Goals include to maximize quality of life and symptom management. Patient/health care surrogate gave his/her permission to discuss.Our advance care planning conversation included a discussion about:    The value and importance of advance care planning  Experiences with loved ones who have been seriously ill or have died  Exploration of personal, cultural or spiritual beliefs that might influence medical decisions  Exploration of goals of care in the event of a sudden injury or illness  Identification of a healthcare agent - Christopher [name] for [facility] Review and  creation of an  advance directive document . Decision not to resuscitate disease- focused treatments due to poor prognosis. CODE STATUS: DNR  I completed a MOST form today. The patient and family outlined their wishes for the following treatment decisions:  Cardiopulmonary  Resuscitation: Do Not Attempt Resuscitation (DNR/No CPR)  Medical Interventions: Limited Additional Interventions: Use medical treatment, IV fluids and cardiac monitoring as indicated, DO NOT USE intubation or mechanical ventilation. May consider use of less invasive airway support such as BiPAP or CPAP. Also provide comfort measures. Transfer to the hospital if indicated. Avoid intensive care.   Antibiotics: Antibiotics if indicated  IV Fluids: IV fluids for a defined trial period  Feeding Tube: Feeding tube for a defined trial period   I spent 30 minutes providing this consultation. More than 50% of the time in this consultation was spent in counseling and care coordination.  ----------------------------------------------------------------------------------------------------------------------------------------------------------------------------------------------------------------  Symptom Management/Plan:  Pain: Has longstanding back pain, currently on norco. Can take qid but is taking bid. I recommended additional acetaminophen for pain control 3 gm daily max.  Mobility: Uses walker for walking, has had 1 reported fall.  May benefit from home health assessment if not recently done.  Disease process: Currently taking 10 rad tx and then will begin on Keytruda. Discussed some side effects. He is toleration rad tx well for now.  Nutrition: Reports likely early satiety, with weight loss. Has had 20% loss in 13 months, a significant loss and harbinger of decline.  Has been taking 30 g protein shakes, this may be causing loss of appetite. I suggested 16 g. With more carbs. Also has been on trazodone for sleep but could switch to mirtazapine to enhance appetite.   ACP:  Explained the concept and patient chose for DNR. States he does not want to be vegetative or a burden to his family.   Follow up Palliative Care Visit: Palliative care will continue to follow for complex medical decision making,  advance care planning, and clarification of goals. Return  prn per patient request.   This visit was coded based on medical decision making (MDM).  PPS: 40%  HOSPICE ELIGIBILITY/DIAGNOSIS: TBD  Chief Complaint: cancer, back pain  HISTORY OF PRESENT ILLNESS:  Christopher [name] is a 88 y.o. year old male  with bladder cancer, metastatic cancer, back pain . Patient seen today to review palliative care needs to include medical decision making and advance care planning as appropriate.   History obtained from review of EMR, discussion with primary team, and interview with family, facility staff/caregiver and/or Christopher [name].  I reviewed available labs, medications, imaging, studies and related documents from the EMR.  Records reviewed and summarized above.   ROS   General: NAD ENMT: denies dysphagia Pulmonary: denies cough, denies increased SOB Abdomen: endorses good appetite, endorses weight loss, endorses occ  constipation, endorses continence of bowel, endorse upper gastric pain R and L UQ GU: denies dysuria, endorses continence of urine MSK:  denies increased weakness,  1 fall1 reported Skin: denies rashes or wounds Neurological: endorses longstanding back pain, denies insomnia Psych: Endorses positive mood Heme/lymph/immuno: denies bruises, abnormal bleeding  Physical Exam: Current and past weights:159 lbs, has lost 20+ lbs  Constitutional: NAD General: frail appearing, wnwd EYES: anicteric sclera, lids intact, no discharge  ENMT: intact hearing, oral mucous membranes moist, dentition intact CV:  no LE edema Pulmonary: no increased work of breathing, no cough, room air Abdomen: intake 80%, no ascites GU: deferred MSK: + sarcopenia, moves all extremities, ambulatory with walker  Skin: warm and dry, no rashes or wounds on visible skin Neuro:  + generalized weakness,  no cognitive impairment Psych: non-anxious affect, A and O x 3 Hem/lymph/immuno: no widespread  bruising  CURRENT PROBLEM LIST:  Patient Active Problem List   Diagnosis Date Noted   Cancer of overlapping sites of bladder ([place]) 02/17/2022   Generalized weakness    Delirium 01/08/2021   Acute renal failure (ARF) ([place]) 01/05/2021   Acute hypoxemic respiratory failure ([place]) 01/05/2021   Gross hematuria 01/03/2021   Acute blood loss anemia 01/03/2021   Iron deficiency anemia due to chronic blood loss 12/18/2019   Urothelial carcinoma of bladder ([place]) 10/29/2019   Chronic anticoagulation 07/15/2019   Lesion of peritoneum 06/11/2019   Imbalance 01/29/2019   Lumbar degenerative disc disease 12/04/2018   Primary osteoarthritis of left knee 04/11/2018   Persistent atrial fibrillation ([place]) 10/11/2016   Chronic right-sided low back pain without sciatica 06/14/2015   Dizziness 03/10/2015   Symptomatic sinus bradycardia 03/10/2015   Syncope and collapse 02/28/2015   Symptomatic bradycardia 02/28/2015   HTN (hypertension) 02/28/2015   Hypercholesteremia 02/28/2015   Benign prostatic hyperplasia with urinary frequency 02/28/2015   Alcohol use 02/28/2015   Other specified health status 02/28/2015   Asymptomatic proteinuria 01/04/2015   Chronic leukopenia 11/14/2013   Glucose intolerance (impaired glucose tolerance) 11/14/2013   High risk medication use 11/14/2013   Dupuytren contracture 09/29/2013   ED (erectile dysfunction) 09/29/2013   Rosanna Randy syndrome 09/29/2013   Macular degeneration 09/29/2013   Osteopenia 09/29/2013   SNHL (sensorineural hearing loss) 09/29/2013   PAST MEDICAL HISTORY:  Active Ambulatory Problems    Diagnosis Date Noted   Syncope and collapse 02/28/2015   Symptomatic  bradycardia 02/28/2015   HTN (hypertension) 02/28/2015   Hypercholesteremia 02/28/2015   Benign prostatic hyperplasia with urinary frequency 02/28/2015   Alcohol use 02/28/2015   Asymptomatic proteinuria 01/04/2015   Chronic leukopenia 11/14/2013   Chronic right-sided low back pain without  sciatica 06/14/2015   Dizziness 03/10/2015   Dupuytren contracture 09/29/2013   ED (erectile dysfunction) 09/29/2013   Rosanna Randy syndrome 09/29/2013   Glucose intolerance (impaired glucose tolerance) 11/14/2013   High risk medication use 11/14/2013   Macular degeneration 09/29/2013   Osteopenia 09/29/2013   Other specified health status 02/28/2015   SNHL (sensorineural hearing loss) 09/29/2013   Symptomatic sinus bradycardia 03/10/2015   Persistent atrial fibrillation ([place]) 10/11/2016   Primary osteoarthritis of left knee 04/11/2018   Imbalance 01/29/2019   Lumbar degenerative disc disease 12/04/2018   Lesion of peritoneum 06/11/2019   Chronic anticoagulation 07/15/2019   Urothelial carcinoma of bladder ([place]) 10/29/2019   Iron deficiency anemia due to chronic blood loss 12/18/2019   Gross hematuria 01/03/2021   Acute blood loss anemia 01/03/2021   Acute renal failure (ARF) ([place]) 01/05/2021   Acute hypoxemic respiratory failure ([place]) 01/05/2021   Delirium 01/08/2021   Generalized weakness    Cancer of overlapping sites of bladder ([place]) 02/17/2022   Resolved Ambulatory Problems    Diagnosis Date Noted   No Resolved Ambulatory Problems   Past Medical History:  Diagnosis Date   A-fib ([place])    Anemia    Arthritis    Bladder cancer (HCC)    BPH (benign prostatic hyperplasia)    Cancer ([place])    Gilbert disease    HLD (hyperlipidemia)    Hypertension    Mollusca contagiosa    Presence of permanent cardiac pacemaker    Sensorineural hearing loss    Squamous cell carcinoma of skin 08/25/2019   Squamous cell carcinoma of skin 02/12/2020   Squamous cell carcinoma of skin 11/30/2020   Squamous cell carcinoma of skin 03/16/2021   Squamous cell carcinoma of skin 03/16/2021   Squamous cell carcinoma of skin 10/05/2021   Squamous cell carcinoma of skin 11/22/2021   Squamous cell carcinoma of skin 11/22/2021   SOCIAL HX:  Social History   Tobacco Use   Smoking status: Never    Smokeless tobacco: Never  Substance Use Topics   Alcohol use: Yes    Alcohol/week: 7.0 standard drinks of alcohol    Types: 7 Glasses of wine per week    Comment: DAILY    FAMILY HX:  Family History  Problem Relation Age of Onset   Diabetes Mother    Lung cancer Father    Cancer Paternal Uncle    Prostate cancer Neg Hx    Bladder Cancer Neg Hx    Kidney cancer Neg Hx       ALLERGIES:  Allergies  Allergen Reactions   Atenolol     Happened a long time ago and can't recall     PERTINENT MEDICATIONS:  Outpatient Encounter Medications as of 03/07/2022  Medication Sig   atorvastatin (LIPITOR) 10 MG tablet Take 10 mg by mouth in the morning.   calcium carbonate (OSCAL) 1500 (600 Ca) MG TABS tablet Take 600 mg of elemental calcium by mouth in the morning.   Cholecalciferol (VITAMIN D3) 50 MCG (2000 UT) TABS Take 2,000 Units by mouth in the morning.   diltiazem (CARDIZEM CD) 240 MG 24 hr capsule Take 1 capsule (240 mg total) by mouth daily.   ELIQUIS 5 MG TABS tablet Take 1 tablet by  mouth daily.   ferrous sulfate 325 (65 FE) MG tablet Take 325 mg by mouth in the morning and at bedtime.   finasteride (PROSCAR) 5 MG tablet TAKE 1 TABLET DAILY (Patient taking differently: Take 5 mg by mouth daily.)   HYDROcodone-acetaminophen (NORCO/VICODIN) 5-325 MG tablet Take 1 tablet by mouth every 6 (six) hours as needed for moderate pain.   metoprolol tartrate (LOPRESSOR) 50 MG tablet Take 1 tablet (50 mg total) by mouth 2 (two) times daily.   Multiple Vitamin (MULTIVITAMIN WITH MINERALS) TABS tablet Take 1 tablet by mouth in the morning.   Multiple Vitamins-Minerals (PRESERVISION AREDS 2 PO) Take 1 tablet by mouth in the morning and at bedtime.   Omega-3 Fatty Acids (FISH OIL) 1000 MG CAPS Take 1,000 mg by mouth daily.   predniSONE (DELTASONE) 20 MG tablet Take 2 pills a day for 1 week; and then 1 pill a day for 1 week.  Taking the morning with food.   tamsulosin (FLOMAX) 0.4 MG CAPS capsule Take  0.4 mg by mouth daily.   No facility-administered encounter medications on file as of 03/07/2022.   Thank you for the opportunity to participate in the care of Mr. Hawkes.  The palliative care team will continue to follow. Please call our office at [phone] if we can be of additional assistance.   Jason Coop, NP , DNP, AGPCNP-BC  COVID-19 PATIENT SCREENING TOOL Asked and negative response unless otherwise noted:  Have you had symptoms of covid, tested positive or been in contact with someone with symptoms/positive test in the past 5-10 days?

## 2022-03-08 ENCOUNTER — Other Ambulatory Visit: Payer: Self-pay | Admitting: Internal Medicine

## 2022-03-08 ENCOUNTER — Inpatient Hospital Stay: Payer: Medicare HMO

## 2022-03-08 ENCOUNTER — Other Ambulatory Visit: Payer: Self-pay

## 2022-03-08 ENCOUNTER — Inpatient Hospital Stay: Payer: Medicare HMO | Admitting: Occupational Therapy

## 2022-03-08 ENCOUNTER — Ambulatory Visit
Admission: RE | Admit: 2022-03-08 | Discharge: 2022-03-08 | Disposition: A | Discharge status: Home / Self Care | Payer: Medicare HMO | Source: Ambulatory Visit | Attending: Radiation Oncology | Admitting: Radiation Oncology

## 2022-03-08 DIAGNOSIS — C678 Malignant neoplasm of overlapping sites of bladder: Secondary | ICD-10-CM | POA: Diagnosis not present

## 2022-03-08 DIAGNOSIS — R42 Dizziness and giddiness: Secondary | ICD-10-CM

## 2022-03-08 DIAGNOSIS — Z510 Encounter for antineoplastic radiation therapy: Secondary | ICD-10-CM | POA: Diagnosis not present

## 2022-03-08 DIAGNOSIS — Z9181 History of falling: Secondary | ICD-10-CM

## 2022-03-08 LAB — RAD ONC ARIA SESSION SUMMARY
Course Elapsed Days: 9
Plan Fractions Treated to Date: 8
Plan Prescribed Dose Per Fraction: 3 Gy
Plan Total Fractions Prescribed: 10
Plan Total Prescribed Dose: 30 Gy
Reference Point Dosage Given to Date: 24 Gy
Reference Point Session Dosage Given: 3 Gy
Session Number: 8

## 2022-03-08 LAB — CBC
HCT: 38.9 % — ABNORMAL LOW (ref 39.0–52.0)
Hemoglobin: 13.2 g/dL (ref 13.0–17.0)
MCH: 32.4 pg (ref 26.0–34.0)
MCHC: 33.9 g/dL (ref 30.0–36.0)
MCV: 95.3 fL (ref 80.0–100.0)
Platelets: 260 10*3/uL (ref 150–400)
RBC: 4.08 MIL/uL — ABNORMAL LOW (ref 4.22–5.81)
RDW: 16.3 % — ABNORMAL HIGH (ref 11.5–15.5)
WBC: 11.9 10*3/uL — ABNORMAL HIGH (ref 4.0–10.5)
nRBC: 0 % (ref 0.0–0.2)

## 2022-03-08 NOTE — Progress Notes (Signed)
Nutrition Assessment:  Patient with metastatic bladder cancer.  Patient receiving palliative radiation and planning to start immunotherapy after radiation.  Past medical history of HLD, HTN, macular degeneration  Met with patient and daughter prior to radiation.  Patient reports that he never feels hungry.  Over the last 2 weeks he has been trying to eat even though he does not have much of an appetite.  Reports constipation but improved with taking miralax.      Medications: reviewed  Labs: reviewed  Anthropometrics:   Height: 71 inches Weight: 159 lb 03/06/22 169 lb on 07/27/20 BMI: 22  6% weight loss in the last 7 months   NUTRITION DIAGNOSIS: Inadequate oral intake related to cancer and cancer related treatment side effects as evidenced by 6% weight loss in the last 7 months and decreased appetite   INTERVENTION:  Encouraged high calorie, high protein diet. Handout provided Encouraged 350 calorie oral nutrition supplement 1-2 times per day if possible Encouraged small frequent snacks q 2 hours Encouraged protein foods at meals and snacks Contact information given  MONITORING, EVALUATION, GOAL: weight trends, intake   NEXT VISIT: ~4 weeks  Christopher [name] B. Christopher [name], [place], [place] Registered Dietitian [phone]

## 2022-03-08 NOTE — Therapy (Signed)
[facility] [place], [place], [place], [address] Phone: [phone]   Fax:  [phone]  Occupational Therapy Screen  Patient Details  Name: Christopher [name] MRN: [id number] Date of Birth: 1928/05/25 No data recorded  Encounter Date: 03/08/2022   OT End of Session - 03/08/22 1225     Visit Number 0             Past Medical History:  Diagnosis Date   A-fib ([place])    Anemia    Arthritis    BACK   Bladder cancer ([place])    BPH (benign prostatic hyperplasia)    Cancer ([place])    skin cancer on face carcinoma   ED (erectile dysfunction)    Rosanna Randy disease    HLD (hyperlipidemia)    Hypertension    Macular degeneration    Mollusca contagiosa    Osteopenia    Presence of permanent cardiac pacemaker    Sensorineural hearing loss    Squamous cell carcinoma of skin 08/25/2019   R cheek    Squamous cell carcinoma of skin 02/12/2020   L neck    Squamous cell carcinoma of skin 11/30/2020   Right elbow, [facility]   Squamous cell carcinoma of skin 03/16/2021   right base of thumb [facility]   Squamous cell carcinoma of skin 03/16/2021   right preauricular - [facility] 11/17/21   Squamous cell carcinoma of skin 10/05/2021   Right dorsum hand - [facility]   Squamous cell carcinoma of skin 11/22/2021   right inf preauricular anterior, [facility]   Squamous cell carcinoma of skin 11/22/2021   right inf preauricular posterior, [facility]    Past Surgical History:  Procedure Laterality Date   CATARACT EXTRACTION Bilateral    CYSTOSCOPY WITH BIOPSY N/A 01/20/2020   Procedure: CYSTOSCOPY WITH BIOPSY;  Surgeon: Abbie Sons, MD;  Location: [facility] ORS;  Service: Urology;  Laterality: N/A;   CYSTOSCOPY WITH FULGERATION N/A 01/20/2020   Procedure: CYSTOSCOPY WITH FULGERATION;  Surgeon: Abbie Sons, MD;  Location: [facility] OR[facility];  Service: Urology;  Laterality: N/A;   CYSTOSCOPY WITH FULGERATION N/A 04/19/2020   Procedure: [place] with  clot evacuation;  Surgeon: Abbie Sons, MD;  Location: [facility] OR[facility];  Service: Urology;  Laterality: N/A;   CYSTOSCOPY WITH FULGERATION N/A 01/05/2021   Procedure: CYSTOSCOPY WITH FULGERATION;  Surgeon: Abbie Sons, MD;  Location: [facility] OR[facility];  Service: Urology;  Laterality: N/A;   HEMORRHOIDECTOMY WITH HEMORRHOID BANDING     KNEE ARTHROSCOPY Left    PACEMAKER INSERTION N/A 03/03/2015   Procedure: INSERTION DUAL LEAD PACEMAKER;  Surgeon: Isaias Cowman, MD;  Location: [facility] OR[facility];  Service: Cardiovascular;  Laterality: N/A;   PULSE GENERATOR IMPLANT N/A 10/18/2020   Procedure: UNILATERAL PULSE GENERATOR IMPLANT;  Surgeon: [name], MD;  Location: [facility];  Service: Neurosurgery;  Laterality: N/A;   SPINAL CORD STIMULATOR TRIAL N/A 10/11/2020   Procedure: PERCUTANEOUS SPINAL CORD STIMULATOR TRIAL;  Surgeon: [name], MD;  Location: [facility];  Service: Neurosurgery;  Laterality: N/A;   THORACIC LAMINECTOMY FOR SPINAL CORD STIMULATOR N/A 10/18/2020   Procedure: PERCUTANEOUS LEAD PLACEMENT;  Surgeon: [name], MD;  Location: [facility];  Service: Neurosurgery;  Laterality: N/A;   TONSILLECTOMY     TRANSURETHRAL RESECTION OF BLADDER TUMOR N/A 10/07/2019   Procedure: TRANSURETHRAL RESECTION OF BLADDER TUMOR (TURBT);  Surgeon: Abbie Sons, MD;  Location: [facility];  Service: Urology;  Laterality: N/A;   TRANSURETHRAL RESECTION OF BLADDER  TUMOR N/A 04/13/2020   Procedure: TRANSURETHRAL RESECTION OF BLADDER TUMOR (TURBT);  Surgeon: Abbie Sons, MD;  Location: [facility] OR[facility];  Service: Urology;  Laterality: N/A;    There were no vitals filed for this visit.   Subjective Assessment - 03/08/22 1223     Subjective  My legs just weak- had home heatlh PT in the past but long time ago - was doing there exercises but lately only doing them about 1 x  month - since I fell- had 2 falls in because of some dizziness - more in my recliner these days- do have appt with cardiologist on 6th Sept     Currently in Pain? --   Has chronic  back pain but no nr provided              Gross 03/03/22 Christopher [name] is a 88 y.o. male with multiple medical problems including history of peritoneal/omental thickening of unclear etiology also with recurrent high-grade bladder cancer status post XRT at [facility].  PET scan February 17, 2022 revealed hypermetabolic metastatic osseous lesions, left supraclavicular lymph node involvement, right upper lobe, and hypermetabolic abdominal retroperitoneal adenopathy.   Patient was not felt to be a candidate for systemic chemotherapy given his age.   He had persistent lumbar back pain likely secondary to metastatic lesions.  He was referred to XRT for that.   INTERVAL HISTORY: Patient has been seen recently in [facility] on 8/16 and 8/21 for dizziness with orthostasis and poor oral intake.  He received IV fluids.   Today, patient returns to [facility] for follow-up.  Patient reports that he is feeling improved.  He denies any dizziness or unsteadiness.  No symptomatic complaints at present.   Denies any neurologic complaints. Denies recent fevers or illnesses. Denies any easy bleeding or bruising.  Denies chest pain. Denies any nausea, vomiting, area.  Has had constipation.  Denies urinary complaints. Patient offers no further specific complaints today.  IMPRESSION/PLAN: Weakness/dizziness -vitals stable today.  Symptoms are improved.  Continue pushing oral fluids.  Patient can return as needed to [facility] for labs/fluids.  He does have pending follow-up with cardiology to discuss metoprolol and Cardizem dosing.   Neoplasm related pain-patient is on XRT.  Stable.  Continue Norco.     RTC next week to see Dr. Rogue Bussing   Patient expressed understanding and was in agreement with this plan. He also understands that He can call clinic at any time with any questions, concerns, or complaints    OT SCREEN 03/08/22:  Patient and daughter advised today to OT  screen.  Patient in a wheelchair because of some dizziness when getting up out of bed in chair.  Patient to have appointment with cardiologist sixth September.  Patient oxygen between 93 and 97%.  Heart rate between 87 and 91. Patient reports that his blood pressures are running a little bit up-and-down on the lower side. Patient reports he had 2 falls in the last 3 to 6 months.  Most of the time because of dizziness getting out of bed. Discussed with patient about slowing down when changing positions from supine to sit or sit to stand to wait for 30 seconds to a minute for dizziness to subside before moving. Patient do report he is using the rolling walker at home.  Daughter with him all the time at home.  He lives one-story house with daughter and husband. Walk-in shower with rails in the chair. Patient admits since last month he  has not been as active.  Staying more in his recliner. He had PT home health in the past but more than 6 months ago.  He was doing some of their exercises but stopped doing it and maybe once a month doing it. Discussed with patient to try and be active at least about 4 times a day 6-7 minutes at a time.  With daughter present. Is Dr. Rockey Situ him he can have pain medicine 4 times a day but he only takes 1 in the morning and 1 in the evening.  He does have a chronic back pain issue. Patient to try and go for meals to the table or sit up. -When walking to the bathroom trying to walk twice if not dizzy. -Sit and stand 5-8 reps from chair can use armrest. -Standing at the recliner the couch is not dizzy heel raises. -Standing at the couch or recliner if not dizzy cannot do hip flexion extension and abduction slowly tapping-both legs 10 reps  -Can continue with his PT exercises in sitting. -Sidestepping at the couch or kitchen counter if not dizzy. Did discuss with him and daughter about the care program as option if cardiology appointment is good and dizziness is better and blood  pressure.  Can talk to me or palliative care about referral. BERG balance test pt score 34/56 - med risk for falling- pt wants to try HEP and then see appt with cardiology to check on blood pressure changes and dizziness. Patient to follow-up with me for recheck a week or 2 after cardiology appointment.  Patient and daughter in agreement.                                       Visit Diagnosis: Dizziness  History of falling    Problem List Patient Active Problem List   Diagnosis Date Noted   Cancer of overlapping sites of bladder ([place]) 02/17/2022   Generalized weakness    Delirium 01/08/2021   Acute renal failure (ARF) ([place]) 01/05/2021   Acute hypoxemic respiratory failure ([place]) 01/05/2021   Gross hematuria 01/03/2021   Acute blood loss anemia 01/03/2021   Iron deficiency anemia due to chronic blood loss 12/18/2019   Urothelial carcinoma of bladder ([place]) 10/29/2019   Chronic anticoagulation 07/15/2019   Lesion of peritoneum 06/11/2019   Imbalance 01/29/2019   Lumbar degenerative disc disease 12/04/2018   Primary osteoarthritis of left knee 04/11/2018   Persistent atrial fibrillation ([place]) 10/11/2016   Chronic right-sided low back pain without sciatica 06/14/2015   Dizziness 03/10/2015   Symptomatic sinus bradycardia 03/10/2015   Syncope and collapse 02/28/2015   Symptomatic bradycardia 02/28/2015   HTN (hypertension) 02/28/2015   Hypercholesteremia 02/28/2015   Benign prostatic hyperplasia with urinary frequency 02/28/2015   Alcohol use 02/28/2015   Other specified health status 02/28/2015   Asymptomatic proteinuria 01/04/2015   Chronic leukopenia 11/14/2013   Glucose intolerance (impaired glucose tolerance) 11/14/2013   High risk medication use 11/14/2013   Dupuytren contracture 09/29/2013   ED (erectile dysfunction) 09/29/2013   Rosanna Randy syndrome 09/29/2013   Macular degeneration 09/29/2013   Osteopenia 09/29/2013   SNHL (sensorineural  hearing loss) 09/29/2013    Rosalyn Gess, OTR/L,CLT 03/08/2022, 12:26 PM  [place] [facility] [address], [place], [place], [address] Phone: [phone]   Fax:  [phone]  Name: SYLAR VOONG MRN: [id number] Date of Birth: 1928-06-23

## 2022-03-09 ENCOUNTER — Other Ambulatory Visit: Payer: Self-pay

## 2022-03-09 ENCOUNTER — Inpatient Hospital Stay: Payer: Medicare HMO

## 2022-03-09 ENCOUNTER — Ambulatory Visit
Admission: RE | Admit: 2022-03-09 | Discharge: 2022-03-09 | Disposition: A | Discharge status: Home / Self Care | Payer: Medicare HMO | Source: Ambulatory Visit | Attending: Radiation Oncology | Admitting: Radiation Oncology

## 2022-03-09 DIAGNOSIS — Z510 Encounter for antineoplastic radiation therapy: Secondary | ICD-10-CM | POA: Diagnosis not present

## 2022-03-09 LAB — RAD ONC ARIA SESSION SUMMARY
Course Elapsed Days: 10
Plan Fractions Treated to Date: 9
Plan Prescribed Dose Per Fraction: 3 Gy
Plan Total Fractions Prescribed: 10
Plan Total Prescribed Dose: 30 Gy
Reference Point Dosage Given to Date: 27 Gy
Reference Point Session Dosage Given: 3 Gy
Session Number: 9

## 2022-03-10 ENCOUNTER — Other Ambulatory Visit: Payer: Self-pay

## 2022-03-10 ENCOUNTER — Ambulatory Visit
Admission: RE | Admit: 2022-03-10 | Discharge: 2022-03-10 | Disposition: A | Discharge status: Home / Self Care | Payer: Medicare HMO | Source: Ambulatory Visit | Attending: Radiation Oncology | Admitting: Radiation Oncology

## 2022-03-10 DIAGNOSIS — Z510 Encounter for antineoplastic radiation therapy: Secondary | ICD-10-CM | POA: Insufficient documentation

## 2022-03-10 DIAGNOSIS — C7951 Secondary malignant neoplasm of bone: Secondary | ICD-10-CM | POA: Insufficient documentation

## 2022-03-10 DIAGNOSIS — C678 Malignant neoplasm of overlapping sites of bladder: Secondary | ICD-10-CM | POA: Diagnosis not present

## 2022-03-10 LAB — RAD ONC ARIA SESSION SUMMARY
Course Elapsed Days: 11
Plan Fractions Treated to Date: 10
Plan Prescribed Dose Per Fraction: 3 Gy
Plan Total Fractions Prescribed: 10
Plan Total Prescribed Dose: 30 Gy
Reference Point Dosage Given to Date: 30 Gy
Reference Point Session Dosage Given: 3 Gy
Session Number: 10

## 2022-03-16 ENCOUNTER — Other Ambulatory Visit: Payer: Self-pay | Admitting: Internal Medicine

## 2022-03-16 ENCOUNTER — Other Ambulatory Visit: Payer: Self-pay | Admitting: Medical Oncology

## 2022-03-16 ENCOUNTER — Ambulatory Visit
Admission: RE | Admit: 2022-03-16 | Discharge: 2022-03-16 | Disposition: A | Discharge status: Home / Self Care | Payer: Medicare HMO | Attending: Internal Medicine | Admitting: Internal Medicine

## 2022-03-16 ENCOUNTER — Telehealth: Payer: Self-pay | Admitting: *Deleted

## 2022-03-16 ENCOUNTER — Inpatient Hospital Stay: Payer: Medicare HMO | Attending: Oncology

## 2022-03-16 ENCOUNTER — Ambulatory Visit
Admission: RE | Admit: 2022-03-16 | Discharge: 2022-03-16 | Disposition: A | Discharge status: Home / Self Care | Payer: Medicare HMO | Source: Ambulatory Visit | Attending: Internal Medicine | Admitting: Internal Medicine

## 2022-03-16 ENCOUNTER — Inpatient Hospital Stay: Payer: Medicare HMO | Admitting: Internal Medicine

## 2022-03-16 ENCOUNTER — Inpatient Hospital Stay: Payer: Medicare HMO

## 2022-03-16 ENCOUNTER — Ambulatory Visit: Payer: Medicare HMO

## 2022-03-16 VITALS — BP 121/66 | HR 71 | Temp 96.9°F | Ht 71.0 in | Wt 165.4 lb

## 2022-03-16 DIAGNOSIS — M25531 Pain in right wrist: Secondary | ICD-10-CM

## 2022-03-16 DIAGNOSIS — C786 Secondary malignant neoplasm of retroperitoneum and peritoneum: Secondary | ICD-10-CM | POA: Diagnosis not present

## 2022-03-16 DIAGNOSIS — R7989 Other specified abnormal findings of blood chemistry: Secondary | ICD-10-CM | POA: Diagnosis not present

## 2022-03-16 DIAGNOSIS — R21 Rash and other nonspecific skin eruption: Secondary | ICD-10-CM | POA: Insufficient documentation

## 2022-03-16 DIAGNOSIS — G893 Neoplasm related pain (acute) (chronic): Secondary | ICD-10-CM | POA: Diagnosis present

## 2022-03-16 DIAGNOSIS — Z923 Personal history of irradiation: Secondary | ICD-10-CM | POA: Insufficient documentation

## 2022-03-16 DIAGNOSIS — R319 Hematuria, unspecified: Secondary | ICD-10-CM | POA: Diagnosis not present

## 2022-03-16 DIAGNOSIS — C770 Secondary and unspecified malignant neoplasm of lymph nodes of head, face and neck: Secondary | ICD-10-CM | POA: Diagnosis not present

## 2022-03-16 DIAGNOSIS — Z79899 Other long term (current) drug therapy: Secondary | ICD-10-CM | POA: Diagnosis not present

## 2022-03-16 DIAGNOSIS — Z801 Family history of malignant neoplasm of trachea, bronchus and lung: Secondary | ICD-10-CM | POA: Diagnosis not present

## 2022-03-16 DIAGNOSIS — M545 Low back pain, unspecified: Secondary | ICD-10-CM | POA: Diagnosis not present

## 2022-03-16 DIAGNOSIS — C678 Malignant neoplasm of overlapping sites of bladder: Secondary | ICD-10-CM | POA: Diagnosis present

## 2022-03-16 DIAGNOSIS — C7951 Secondary malignant neoplasm of bone: Secondary | ICD-10-CM | POA: Diagnosis not present

## 2022-03-16 DIAGNOSIS — I119 Hypertensive heart disease without heart failure: Secondary | ICD-10-CM | POA: Diagnosis not present

## 2022-03-16 DIAGNOSIS — R197 Diarrhea, unspecified: Secondary | ICD-10-CM | POA: Diagnosis not present

## 2022-03-16 DIAGNOSIS — M85831 Other specified disorders of bone density and structure, right forearm: Secondary | ICD-10-CM | POA: Insufficient documentation

## 2022-03-16 DIAGNOSIS — D500 Iron deficiency anemia secondary to blood loss (chronic): Secondary | ICD-10-CM | POA: Diagnosis not present

## 2022-03-16 DIAGNOSIS — K5900 Constipation, unspecified: Secondary | ICD-10-CM | POA: Insufficient documentation

## 2022-03-16 DIAGNOSIS — Z5112 Encounter for antineoplastic immunotherapy: Secondary | ICD-10-CM | POA: Insufficient documentation

## 2022-03-16 DIAGNOSIS — Z85828 Personal history of other malignant neoplasm of skin: Secondary | ICD-10-CM | POA: Diagnosis not present

## 2022-03-16 LAB — COMPREHENSIVE METABOLIC PANEL
ALT: 29 U/L (ref 0–44)
AST: 21 U/L (ref 15–41)
Albumin: 3.1 g/dL — ABNORMAL LOW (ref 3.5–5.0)
Alkaline Phosphatase: 83 U/L (ref 38–126)
Anion gap: 5 (ref 5–15)
BUN: 21 mg/dL (ref 8–23)
CO2: 31 mmol/L (ref 22–32)
Calcium: 9.1 mg/dL (ref 8.9–10.3)
Chloride: 102 mmol/L (ref 98–111)
Creatinine, Ser: 1.07 mg/dL (ref 0.61–1.24)
GFR, Estimated: >60 mL/min (ref >60)
Glucose, Bld: 112 mg/dL — ABNORMAL HIGH (ref 70–99)
Potassium: 4.7 mmol/L (ref 3.5–5.1)
Sodium: 138 mmol/L (ref 135–145)
Total Bilirubin: 1 mg/dL (ref 0.3–1.2)
Total Protein: 6.3 g/dL — ABNORMAL LOW (ref 6.5–8.1)

## 2022-03-16 LAB — CBC WITH DIFFERENTIAL/PLATELET
Abs Immature Granulocytes: 0.02 10*3/uL (ref 0.00–0.07)
Basophils Absolute: 0 10*3/uL (ref 0.0–0.1)
Basophils Relative: 0 %
Eosinophils Absolute: 0.2 10*3/uL (ref 0.0–0.5)
Eosinophils Relative: 4 %
HCT: 38.4 % — ABNORMAL LOW (ref 39.0–52.0)
Hemoglobin: 12.4 g/dL — ABNORMAL LOW (ref 13.0–17.0)
Immature Granulocytes: 0 %
Lymphocytes Relative: 9 %
Lymphs Abs: 0.4 10*3/uL — ABNORMAL LOW (ref 0.7–4.0)
MCH: 31.7 pg (ref 26.0–34.0)
MCHC: 32.3 g/dL (ref 30.0–36.0)
MCV: 98.2 fL (ref 80.0–100.0)
Monocytes Absolute: 0.6 10*3/uL (ref 0.1–1.0)
Monocytes Relative: 13 %
Neutro Abs: 3.6 10*3/uL (ref 1.7–7.7)
Neutrophils Relative %: 74 %
Platelets: 193 10*3/uL (ref 150–400)
RBC: 3.91 MIL/uL — ABNORMAL LOW (ref 4.22–5.81)
RDW: 16.9 % — ABNORMAL HIGH (ref 11.5–15.5)
WBC: 4.9 10*3/uL (ref 4.0–10.5)
nRBC: 0 % (ref 0.0–0.2)

## 2022-03-16 LAB — TSH: TSH: 2.198 u[IU]/mL (ref 0.350–4.500)

## 2022-03-16 MED ORDER — SODIUM CHLORIDE 0.9 % IV SOLN
200.0000 mg | Freq: Once | INTRAVENOUS | Status: AC
  Administered 2022-03-16: 200 mg via INTRAVENOUS
  Filled 2022-03-16: qty 8

## 2022-03-16 MED ORDER — HYDROCODONE-ACETAMINOPHEN 5-325 MG PO TABS: 1.0000 | ORAL_TABLET | Freq: Three times a day (TID) | ORAL | 0 refills | Status: DC | PRN

## 2022-03-16 MED ORDER — SODIUM CHLORIDE 0.9 % IV SOLN
Freq: Once | INTRAVENOUS | Status: AC
  Filled 2022-03-16: qty 250

## 2022-03-16 NOTE — Progress Notes (Signed)
[place] NOTE  Patient Care Team: [name], MD as PCP - General (Infectious Diseases) Cammie Sickle, MD as Consulting Physician (Oncology) Jason Coop, NP as Nurse Practitioner (Hospice and Palliative Medicine) Borders, Kirt Boys, NP as Nurse Practitioner (Hospice and Palliative Medicine)  CHIEF COMPLAINTS/PURPOSE OF CONSULTATION: Bladder cancer  Oncology History Overview Note  # NOV 2020- [incidental/hematuria work-up]-subtle peritoneal thickening/plaque-like lesions; March 2021-CT scan progressive omental/peritoneal thickening  #Microscopic hematuria- sec to BPH [cystoscopy/CT urogram negative; Dr.Stoiff]; March 2021 CT scan-new lesion in the bladder diverticulum  # MAY-June 2021- severe IDA sec to hemauria [hb 8.5; Ferritin- ]  #July 2022-hemorrhagic shock/bladder cancer-s/p radiation [facility]; no chemotherapy.[Dr.Neilsen]  # AUG 11th, 2023- PET scan- Hypermetabolic metastatic osseous lesions, left supraclavicular lymph nodes, right upper lobe nodule and abdominal retroperitoneal adenopathy- BIOPSY- POSITIVE FOR MALIGNANCY; QNS for IHC. BACK RT s/p March 10, 2022 '[]'$   # SEP 7th, 2023- Keytruda single agent.   # on eliquis ? A.fib/pacemaker;   Urothelial carcinoma of bladder ([place])  10/29/2019 Initial Diagnosis   Urothelial carcinoma of bladder (HCC)   Cancer of overlapping sites of bladder ([place])  02/17/2022 Initial Diagnosis   Cancer of overlapping sites of bladder ([place])   02/17/2022 Cancer Staging   Staging form: Urinary Bladder, AJCC 8th Edition - Clinical: Stage Unknown (cTX, cN2, cM1) - Signed by Cammie Sickle, MD on 02/17/2022   03/16/2022 -  Chemotherapy   Patient is on Treatment Plan : BLADDER Pembrolizumab (200) q21d        HISTORY OF PRESENTING ILLNESS: pt ambulating  with a rolling walker ; is accompanied by his daughter  Christopher [name] 88 y.o.  male history of chronic back pain; and multiple  comorbidities with LIKELY recurrent /STAGE IV bladder malignancy [supraclavicular lymph node positive for malignancy; QNS for IHC] is here for follow-up/proceed with immunotherapy.  In the interim patient finished radiation with Dr. Donella Stade for his back.  States his back pain is is no significantly better.  He continues to take hydrocodone up to 3 pills a day.  He also complains of pain in his right forearm.  Does admit to exertion approximately 2 months ago.  Constipation alternating with diarrhea.  Patient denies any blood in stools.  He denies any dizzy spells.  Review of Systems  Constitutional:  Positive for malaise/fatigue and weight loss. Negative for chills, diaphoresis and fever.  HENT:  Negative for nosebleeds and sore throat.   Eyes:  Negative for double vision.  Respiratory:  Negative for cough, hemoptysis, sputum production and wheezing.   Cardiovascular:  Negative for chest pain, palpitations, orthopnea and leg swelling.  Gastrointestinal:  Positive for constipation. Negative for abdominal pain, blood in stool, diarrhea, heartburn, melena, nausea and vomiting.  Genitourinary:  Positive for hematuria. Negative for dysuria, frequency and urgency.  Musculoskeletal:  Positive for back pain and joint pain.  Skin: Negative.  Negative for itching and rash.  Neurological:  Negative for dizziness, tingling, focal weakness, weakness and headaches.  Endo/Heme/Allergies:  Does not bruise/bleed easily.  Psychiatric/Behavioral:  Negative for depression. The patient is not nervous/anxious and does not have insomnia.      MEDICAL HISTORY:  Past Medical History:  Diagnosis Date   A-fib ([place])    Anemia    Arthritis    BACK   Bladder cancer (HCC)    BPH (benign prostatic hyperplasia)    Cancer (HCC)    skin cancer on face carcinoma   ED (erectile dysfunction)  Rosanna Randy disease    HLD (hyperlipidemia)    Hypertension    Macular degeneration    Mollusca contagiosa    Osteopenia     Presence of permanent cardiac pacemaker    Sensorineural hearing loss    Squamous cell carcinoma of skin 08/25/2019   R cheek    Squamous cell carcinoma of skin 02/12/2020   L neck    Squamous cell carcinoma of skin 11/30/2020   Right elbow, [facility]   Squamous cell carcinoma of skin 03/16/2021   right base of thumb [facility]   Squamous cell carcinoma of skin 03/16/2021   right preauricular - [facility] 11/17/21   Squamous cell carcinoma of skin 10/05/2021   Right dorsum hand - [facility]   Squamous cell carcinoma of skin 11/22/2021   right inf preauricular anterior, [facility]   Squamous cell carcinoma of skin 11/22/2021   right inf preauricular posterior, [facility]    SURGICAL HISTORY: Past Surgical History:  Procedure Laterality Date   CATARACT EXTRACTION Bilateral    CYSTOSCOPY WITH BIOPSY N/A 01/20/2020   Procedure: CYSTOSCOPY WITH BIOPSY;  Surgeon: Abbie Sons, MD;  Location: [facility];  Service: Urology;  Laterality: N/A;   CYSTOSCOPY WITH FULGERATION N/A 01/20/2020   Procedure: CYSTOSCOPY WITH FULGERATION;  Surgeon: Abbie Sons, MD;  Location: [facility];  Service: Urology;  Laterality: N/A;   CYSTOSCOPY WITH FULGERATION N/A 04/19/2020   Procedure: [place] with clot evacuation;  Surgeon: Abbie Sons, MD;  Location: [facility];  Service: Urology;  Laterality: N/A;   CYSTOSCOPY WITH FULGERATION N/A 01/05/2021   Procedure: CYSTOSCOPY WITH FULGERATION;  Surgeon: Abbie Sons, MD;  Location: [facility];  Service: Urology;  Laterality: N/A;   HEMORRHOIDECTOMY WITH HEMORRHOID BANDING     KNEE ARTHROSCOPY Left    PACEMAKER INSERTION N/A 03/03/2015   Procedure: INSERTION DUAL LEAD PACEMAKER;  Surgeon: Isaias Cowman, MD;  Location: [facility];  Service: Cardiovascular;  Laterality: N/A;   PULSE GENERATOR IMPLANT N/A 10/18/2020   Procedure: UNILATERAL PULSE GENERATOR IMPLANT;  Surgeon: [name], MD;  Location: [facility];  Service: Neurosurgery;  Laterality: N/A;   SPINAL CORD  STIMULATOR TRIAL N/A 10/11/2020   Procedure: PERCUTANEOUS SPINAL CORD STIMULATOR TRIAL;  Surgeon: [name], MD;  Location: [facility];  Service: Neurosurgery;  Laterality: N/A;   THORACIC LAMINECTOMY FOR SPINAL CORD STIMULATOR N/A 10/18/2020   Procedure: PERCUTANEOUS LEAD PLACEMENT;  Surgeon: [name], MD;  Location: [facility];  Service: Neurosurgery;  Laterality: N/A;   TONSILLECTOMY     TRANSURETHRAL RESECTION OF BLADDER TUMOR N/A 10/07/2019   Procedure: TRANSURETHRAL RESECTION OF BLADDER TUMOR (TURBT);  Surgeon: Abbie Sons, MD;  Location: [facility];  Service: Urology;  Laterality: N/A;   TRANSURETHRAL RESECTION OF BLADDER TUMOR N/A 04/13/2020   Procedure: TRANSURETHRAL RESECTION OF BLADDER TUMOR (TURBT);  Surgeon: Abbie Sons, MD;  Location: [facility];  Service: Urology;  Laterality: N/A;    SOCIAL HISTORY: Social History   Socioeconomic History   Marital status: Widowed    Spouse name: Not on file   Number of children: Not on file   Years of education: Not on file   Highest education level: Not on file  Occupational History   Not on file  Tobacco Use   Smoking status: Never   Smokeless tobacco: Never  Vaping Use   Vaping Use: Never used  Substance and Sexual Activity   Alcohol use: Yes    Alcohol/week: 7.0 standard drinks of alcohol    Types:  7 Glasses of wine per week    Comment: DAILY    Drug use: No   Sexual activity: Yes    Birth control/protection: None  Other Topics Concern   Not on file  Social History Narrative   Retd. Civil engineer, contracting; [place]; self. Never smoked; alcohol- glass wine/drink every night.     Social Determinants of Health   Financial Resource Strain: Not on file  Food Insecurity: Not on file  Transportation Needs: Not on file  Physical Activity: Not on file  Stress: Not on file  Social Connections: Not on file  Intimate Partner Violence: Not on file    FAMILY HISTORY: Family History  Problem Relation Age of Onset   Diabetes  Mother    Lung cancer Father    Cancer Paternal Uncle    Prostate cancer Neg Hx    Bladder Cancer Neg Hx    Kidney cancer Neg Hx     ALLERGIES:  is allergic to atenolol.  MEDICATIONS:  Current Outpatient Medications  Medication Sig Dispense Refill   atorvastatin (LIPITOR) 10 MG tablet Take 10 mg by mouth in the morning.     calcium carbonate (OSCAL) 1500 (600 Ca) MG TABS tablet Take 600 mg of elemental calcium by mouth in the morning.     Cholecalciferol (VITAMIN D3) 50 MCG (2000 UT) TABS Take 2,000 Units by mouth in the morning.     ELIQUIS 5 MG TABS tablet Take 1 tablet by mouth daily.     ferrous sulfate 325 (65 FE) MG tablet Take 325 mg by mouth in the morning and at bedtime.     finasteride (PROSCAR) 5 MG tablet TAKE 1 TABLET DAILY (Patient taking differently: Take 5 mg by mouth daily.) 90 tablet 3   Multiple Vitamin (MULTIVITAMIN WITH MINERALS) TABS tablet Take 1 tablet by mouth in the morning.     Multiple Vitamins-Minerals (PRESERVISION AREDS 2 PO) Take 1 tablet by mouth in the morning and at bedtime.     Omega-3 Fatty Acids (FISH OIL) 1000 MG CAPS Take 1,000 mg by mouth daily.     predniSONE (DELTASONE) 20 MG tablet Take 2 pills a day for 1 week; and then 1 pill a day for 1 week.  Taking the morning with food. 40 tablet 0   tamsulosin (FLOMAX) 0.4 MG CAPS capsule Take 0.4 mg by mouth daily.     diltiazem (CARDIZEM CD) 240 MG 24 hr capsule Take 1 capsule (240 mg total) by mouth daily. 30 capsule 0   HYDROcodone-acetaminophen (NORCO/VICODIN) 5-325 MG tablet Take 1 tablet by mouth every 8 (eight) hours as needed for moderate pain. 65 tablet 0   metoprolol tartrate (LOPRESSOR) 50 MG tablet Take 1 tablet (50 mg total) by mouth 2 (two) times daily. (Patient taking differently: Take 50 mg by mouth 2 (two) times daily. 2 in the morning and 2 qhs) 60 tablet 0   No current facility-administered medications for this visit.      Marland Kitchen  PHYSICAL EXAMINATION: ECOG PERFORMANCE STATUS: 0 -  Asymptomatic  Vitals:   03/16/22 0942  BP: 121/66  Pulse: 71  Temp: (!) 96.9 F (36.1 C)  SpO2: 98%     Filed Weights   03/16/22 0942  Weight: 165 lb 6.4 oz (75 kg)    Frail-appearing Caucasian male patient.   Physical Exam HENT:     Head: Normocephalic and atraumatic.     Mouth/Throat:     Pharynx: No oropharyngeal exudate.  Eyes:     Pupils:  Pupils are equal, round, and reactive to light.  Cardiovascular:     Rate and Rhythm: Normal rate and regular rhythm.  Pulmonary:     Effort: No respiratory distress.     Breath sounds: No wheezing.  Abdominal:     General: Bowel sounds are normal. There is no distension.     Palpations: Abdomen is soft. There is no mass.     Tenderness: There is no abdominal tenderness. There is no guarding or rebound.  Musculoskeletal:        General: No tenderness. Normal range of motion.     Cervical back: Normal range of motion and neck supple.  Skin:    General: Skin is warm.  Neurological:     Mental Status: He is alert and oriented to person, place, and time.  Psychiatric:        Mood and Affect: Affect normal.     LABORATORY DATA:  I have reviewed the data as listed Lab Results  Component Value Date   WBC 4.9 03/16/2022   HGB 12.4 (L) 03/16/2022   HCT 38.4 (L) 03/16/2022   MCV 98.2 03/16/2022   PLT 193 03/16/2022   Recent Labs    03/03/22 1021 03/06/22 0949 03/16/22 0934  NA 136 139 138  K 3.6 3.9 4.7  CL 100 102 102  CO2 '30 31 31  '$ GLUCOSE 95 149* 112*  BUN 25* 27* 21  CREATININE 0.89 0.93 1.07  CALCIUM 9.1 9.2 9.1  GFRNONAA >60 >60 >60  PROT 6.8 6.7 6.3*  ALBUMIN 3.2* 3.1* 3.1*  AST 44* 42* 21  ALT 62* 83* 29  ALKPHOS 83 86 83  BILITOT 1.1 0.8 1.0    RADIOGRAPHIC STUDIES: I have personally reviewed the radiological images as listed and agreed with the findings in the report. DG Forearm Right  Result Date: 03/16/2022 CLINICAL DATA:  Pain in right arm after working in the yard yesterday. Pain radiates  from elbow to wrist. History of bladder cancer. Recent PET-CT with findings of multiple hypermetabolic osseous lesions. EXAM: RIGHT FOREARM - 2 VIEW COMPARISON:  None Available. FINDINGS: Diffuse osteopenia. No fracture. A 3.8 cm permeative osteolytic cortical lesion is seen in the mid ulnar diaphysis and corresponds to focal area of radiotracer uptake on PET-CT 02/16/2022. The lesion involves approximately 1/3 of the bone diameter. Small enthesophyte noted at the olecranon process. Vascular calcifications in the visualized right arm. IMPRESSION: No fracture. A 3.8 cm permeative osteolytic cortical lesion in the mid ulnar diaphysis corresponds to focal area of radiotracer uptake on PET-CT 02/16/2022, likely reflecting an osseous metastasis given history of bladder cancer. These results will be called to the ordering clinician or representative by the Radiologist Assistant, and communication documented in the PACS or [employer]. Electronically Signed   By: Ileana Roup M.D.   On: 03/16/2022 13:28   [place] CORE BIOPSY (LYMPH NODES)  Result Date: 02/23/2022 INDICATION: Lymphadenopathy EXAM: Ultrasound-guided core needle biopsy of left supraclavicular lymph node MEDICATIONS: None. ANESTHESIA/SEDATION: Local analgesia COMPLICATIONS: None immediate. PROCEDURE: Informed written consent was obtained from the patient after a thorough discussion of the procedural risks, benefits and alternatives. All questions were addressed. Maximal Sterile Barrier Technique was utilized including caps, mask, sterile gowns, sterile gloves, sterile drape, hand hygiene and skin antiseptic. A timeout was performed prior to the initiation of the procedure. The patient was placed supine on the exam table. Ultrasound of the left supraclavicular region demonstrated abnormally enlarged hypoechoic lymph node, consistent with recent PET-CT imaging. Skin entry  site was marked, and the overlying skin was prepped draped in the standard sterile  fashion. Local analgesia was obtained with 1% lidocaine. Using ultrasound guidance, core needle biopsy was performed of the left supraclavicular lymph node using an 18 gauge core biopsy device x6 total passes. Specimens were submitted in saline to pathology for further handling. Limited postprocedure imaging demonstrated no hematoma. A clean dressing was placed after manual hemostasis. The patient tolerated the procedure well without immediate complication. IMPRESSION: Successful ultrasound-guided core needle biopsy of left supraclavicular lymph node. Electronically Signed   By: Albin Felling M.D.   On: 02/23/2022 15:17   NM PET Image Restag (PS) Skull Base To Thigh  Result Date: 02/16/2022 CLINICAL DATA:  Initial treatment strategy for peritoneal carcinomatosis. EXAM: NUCLEAR MEDICINE PET SKULL BASE TO THIGH TECHNIQUE: 8.7 mCi F-18 FDG was injected intravenously. Full-ring PET imaging was performed from the skull base to thigh after the radiotracer. CT data was obtained and used for attenuation correction and anatomic localization. Fasting blood glucose: 119 mg/dl COMPARISON:  CT abdomen pelvis 01/24/2022. FINDINGS: Mediastinal blood pool activity: SUV max 1.7 Liver activity: SUV max NA NECK: No hypermetabolic adenopathy. Incidental CT findings: None. CHEST: Hypermetabolic left supraclavicular lymph nodes measure up to 7 mm (2/72), SUV max 5.8. 8 mm peripheral right upper lobe nodule (2/85), SUV max 3.9. Possible hypermetabolic right hilar lymph node, SUV max 2.7. Comparison with CT is challenging without IV contrast. No hypermetabolic mediastinal or axillary lymph nodes. Incidental CT findings: Atherosclerotic calcification of the aorta, aortic valve and coronary arteries. Heart is enlarged. No pericardial or pleural effusion. There may be mild coarsening of the pulmonary markings. ABDOMEN/PELVIS: Periaortic lymph nodes measure up to 1.3 cm in the left periaortic station (2/156), SUV max 5.5. No abnormal  hypermetabolism in the liver, adrenal glands, spleen or pancreas. No abnormal peritoneal hypermetabolism. Incidental CT findings: Liver, gallbladder, adrenal glands, kidneys, spleen, pancreas, stomach and bowel are grossly unremarkable. Atherosclerotic calcification of the aorta. Left posterolateral bladder diverticulum contains a tiny stone. Mild perivesical haziness. Previously questioned omental thickening is not well visualized. SKELETON: Multiple hypermetabolic lesions are seen in the visualized osseous structures. Index hypermetabolism in the L1 vertebral body, SUV max 10.3, corresponds to a 1.7 cm lytic lesion. Incidental CT findings: Degenerative changes in the spine. Spinal stimulator wires in place. IMPRESSION: 1. Hypermetabolic metastatic osseous lesions, left supraclavicular lymph nodes, right upper lobe nodule and abdominal retroperitoneal adenopathy, possibly related to the patient's known diagnosis of bladder cancer. Melanoma not excluded. 2. Omental thickening seen on 01/24/2022 is no longer appreciated. 3. Left posterolateral bladder diverticulum with a tiny stone. Mild perivesical haziness, possibly treatment related or due to cystitis. 4. Aortic atherosclerosis (ICD10-I70.0). Coronary artery calcification. Electronically Signed   By: Lorin Picket M.D.   On: 02/16/2022 14:32    ASSESSMENT & PLAN:   Cancer of overlapping sites of bladder [facility])  JUNE 2022- CT [facility]- Multiple recurrent high-grade-suspicious for muscle invasive urothelial malignancy; possible extravascular extension of the tumor [biopsy-[facility] negative for muscle invasion].  S/p  radiation at [facility].  S/p cystoscopy- NEG. AUG 11th, 2023- PET scan- Hypermetabolic metastatic osseous lesions, left supraclavicular lymph nodes, right upper lobe nodule and abdominal retroperitoneal adenopathy-highly concerning for malignancy.  Suprclaviulcar LN- biopsy- positive for malignancy; QNS for IHC.  plan.  Discussed with Dr. Ricky Ala, [facility] of the  above plan. Patient wants to over his urology care locally.   # Proceed with Keytruda. Labs today reviewed;  acceptable for treatment today. I again reviewed  and diiscussed single agent Keytruda.  I discussed the mechanism of action; The goal of therapy is palliative; and length of treatments are likely ongoing/based upon the results of the scans. Discussed the potential side effects of immunotherapy including but not limited to diarrhea; skin rash; elevated LFTs/endocrine abnormalities etc. discussed the treatments are usually indefinite/until progression or side effects.  We will plan to have imaging after 3-4 cycles.  # Lumbar back pain-likely secondary to metastatic lesions-currently s/p radiation oncology for palliative radiation [sep 1st 2023].  Continue hydrocodone q 8 hours prn. Marland Kitchen STABLE.   # Right arm pain- ?Trauma vs metastatic disease- will plan X- rays of the upper extremity.   # Iron deficiency anemia secondary to chronic blood loss/hematuria [see below]- S/p  IV Venofer; Today Hb-12; July 2023 iron studies-no significant deficiency.  HOLD venofer for now.  # IV access: PIV for now.   # DISPOSITION:  # Keytruda; NO IVFs  # follow up in 3 weeks- MD; labs- cbc/cmp; Beryle Flock-; possible IVFs. Over 1 hour-Dr.B         All questions were answered. The patient knows to call the clinic with any problems, questions or concerns.    Cammie Sickle, MD 03/16/2022 2:23 PM

## 2022-03-16 NOTE — Assessment & Plan Note (Addendum)
JUNE 2022- CT [facility]- Multiple recurrent high-grade-suspicious for muscle invasive urothelial malignancy; possible extravascular extension of the tumor [biopsy-[facility] negative for muscle invasion].  S/p  radiation at [facility].  S/p cystoscopy- NEG. AUG 11th, 2023- PET scan- Hypermetabolic metastatic osseous lesions, left supraclavicular lymph nodes, right upper lobe nodule and abdominal retroperitoneal adenopathy-highly concerning for malignancy.  Suprclaviulcar LN- biopsy- positive for malignancy; QNS for IHC.  plan.  Discussed with Dr. Ricky Ala, [facility] of the above plan. Patient wants to over his urology care locally.   # Proceed with Keytruda. Labs today reviewed;  acceptable for treatment today. I again reviewed and diiscussed single agent Keytruda.  I discussed the mechanism of action; The goal of therapy is palliative; and length of treatments are likely ongoing/based upon the results of the scans. Discussed the potential side effects of immunotherapy including but not limited to diarrhea; skin rash; elevated LFTs/endocrine abnormalities etc. discussed the treatments are usually indefinite/until progression or side effects.  We will plan to have imaging after 3-4 cycles.  # Lumbar back pain-likely secondary to metastatic lesions-currently s/p radiation oncology for palliative radiation [sep 1st 2023].  Continue hydrocodone q 8 hours prn. Marland Kitchen STABLE.   # Right arm pain- ?Trauma vs metastatic disease- will plan X- rays of the upper extremity.   # Iron deficiency anemia secondary to chronic blood loss/hematuria [see below]- S/p  IV Venofer; Today Hb-12; July 2023 iron studies-no significant deficiency.  HOLD venofer for now.  # IV access: PIV for now.   # DISPOSITION:  # Keytruda; NO IVFs  # follow up in 3 weeks- MD; labs- cbc/cmp; Beryle Flock-; possible IVFs. Over 1 hour-Dr.B

## 2022-03-16 NOTE — Progress Notes (Signed)
No concerns. 

## 2022-03-16 NOTE — Telephone Encounter (Signed)
Called report  IMPRESSION: No fracture. A 3.8 cm permeative osteolytic cortical lesion in the mid ulnar diaphysis corresponds to focal area of radiotracer uptake on PET-CT 02/16/2022, likely reflecting an osseous metastasis given history of bladder cancer.   These results will be called to the ordering clinician or representative by the Radiologist Assistant, and communication documented in the PACS or [employer].     Electronically Signed   By: Ileana Roup M.D.   On: 03/16/2022 13:28

## 2022-03-16 NOTE — Patient Instructions (Signed)
[facility]  Discharge Instructions: Thank you for choosing [facility] to provide your oncology and hematology care.  If you have a lab appointment with the Cancer Center, please go directly to the Cancer Center and check in at the registration area.  Wear comfortable clothing and clothing appropriate for easy access to any Portacath or PICC line.   We strive to give you quality time with your provider. You may need to reschedule your appointment if you arrive late (15 or more minutes).  Arriving late affects you and other patients whose appointments are after yours.  Also, if you miss three or more appointments without notifying the office, you may be dismissed from the clinic at the provider's discretion.      For prescription refill requests, have your pharmacy contact our office and allow 72 hours for refills to be completed.       To help prevent nausea and vomiting after your treatment, we encourage you to take your nausea medication as directed.  BELOW ARE SYMPTOMS THAT SHOULD BE REPORTED IMMEDIATELY: *FEVER GREATER THAN 100.4 F (38 C) OR HIGHER *CHILLS OR SWEATING *NAUSEA AND VOMITING THAT IS NOT CONTROLLED WITH YOUR NAUSEA MEDICATION *UNUSUAL SHORTNESS OF BREATH *UNUSUAL BRUISING OR BLEEDING *URINARY PROBLEMS (pain or burning when urinating, or frequent urination) *BOWEL PROBLEMS (unusual diarrhea, constipation, pain near the anus) TENDERNESS IN MOUTH AND THROAT WITH OR WITHOUT PRESENCE OF ULCERS (sore throat, sores in mouth, or a toothache) UNUSUAL RASH, SWELLING OR PAIN  UNUSUAL VAGINAL DISCHARGE OR ITCHING   Items with * indicate a potential emergency and should be followed up as soon as possible or go to the Emergency Department if any problems should occur.  Please show the CHEMOTHERAPY ALERT CARD or IMMUNOTHERAPY ALERT CARD at check-in to the Emergency Department and triage nurse.  Should you have questions after your  visit or need to cancel or reschedule your appointment, please contact [facility]  [phone] and follow the prompts.  Office hours are 8:00 a.m. to 4:30 p.m. Monday - Friday. Please note that voicemails left after 4:00 p.m. may not be returned until the following business day.  We are closed weekends and major holidays. You have access to a nurse at all times for urgent questions. Please call the main number to the clinic [phone] and follow the prompts.  For any non-urgent questions, you may also contact your provider using MyChart. We now offer e-Visits for anyone 18 and older to request care online for non-urgent symptoms. For details visit [email or web address].   Also download the MyChart app! Go to the app store, search "MyChart", open the app, select [facility], and log in with your MyChart username and password.  Masks are optional in the cancer centers. If you would like for your care team to wear a mask while they are taking care of you, please let them know. For doctor visits, patients may have with them one support person who is at least 86 years old. At this time, visitors are not allowed in the infusion area.   

## 2022-03-17 ENCOUNTER — Telehealth: Payer: Self-pay

## 2022-03-17 LAB — T4: T4, Total: 6.9 ug/dL (ref 4.5–12.0)

## 2022-03-17 NOTE — Telephone Encounter (Signed)
Telephone call to patient for follow up after receiving first infusion.   Patient daughter states infusion went great.  States eating fair and drinking plenty of fluids.   Denies any nausea or vomiting.  Encouraged patient to call for any concerns or questions.

## 2022-03-21 ENCOUNTER — Encounter: Payer: Self-pay | Admitting: Internal Medicine

## 2022-03-22 ENCOUNTER — Telehealth: Payer: Self-pay | Admitting: Primary Care

## 2022-03-22 ENCOUNTER — Telehealth: Payer: Self-pay | Admitting: Internal Medicine

## 2022-03-22 ENCOUNTER — Encounter: Payer: Self-pay | Admitting: Internal Medicine

## 2022-03-22 NOTE — Telephone Encounter (Signed)
I spoke to patient's daughter regarding her ongoing concerns for arm pain/neck pain.  Will refer the patient to Dr. Donella Stade discussed regarding palliative radiation.  Also recommend increasing the hydrocodone to every 6-8 hours.  Please make an appointment with Dr. Genelle Gather.  [place]

## 2022-03-22 NOTE — Telephone Encounter (Signed)
T/c from daughter for appetite enhancer. Will have RN call tomorrow and clarify for f/u. I am happy to trial mirtazapine.5-15 mg at hs.

## 2022-03-23 ENCOUNTER — Telehealth: Payer: Self-pay

## 2022-03-23 ENCOUNTER — Other Ambulatory Visit: Payer: Self-pay

## 2022-03-23 ENCOUNTER — Telehealth: Payer: Self-pay | Admitting: Primary Care

## 2022-03-23 DIAGNOSIS — C678 Malignant neoplasm of overlapping sites of bladder: Secondary | ICD-10-CM

## 2022-03-23 NOTE — Telephone Encounter (Signed)
Referral placed, [facility] notified.

## 2022-03-23 NOTE — Telephone Encounter (Signed)
Dr. Jacinto Reap spoke with Magda Paganini, see phone note.

## 2022-03-23 NOTE — Telephone Encounter (Signed)
Nurse spoke with daughter, I will send mirtazapine 15 mg  po q hs to [facility] [place] for 2 week trial. F/u appt made.

## 2022-03-23 NOTE — Telephone Encounter (Signed)
1030 am.  Return call made to daughter Magda Paganini.  She is requesting appetite stimulant for patient.  Follow up visit scheduled for October 5th through [facility].  NP notified and will escribe mirtazapine 7.5 mg.  [facility] [place] per daughter request.

## 2022-04-06 ENCOUNTER — Inpatient Hospital Stay (HOSPITAL_BASED_OUTPATIENT_CLINIC_OR_DEPARTMENT_OTHER): Payer: Medicare HMO | Admitting: Internal Medicine

## 2022-04-06 ENCOUNTER — Inpatient Hospital Stay: Payer: Medicare HMO

## 2022-04-06 ENCOUNTER — Other Ambulatory Visit: Payer: Self-pay

## 2022-04-06 VITALS — BP 133/71 | HR 74 | Temp 97.7°F | Resp 16

## 2022-04-06 DIAGNOSIS — D500 Iron deficiency anemia secondary to blood loss (chronic): Secondary | ICD-10-CM

## 2022-04-06 DIAGNOSIS — C678 Malignant neoplasm of overlapping sites of bladder: Secondary | ICD-10-CM | POA: Diagnosis not present

## 2022-04-06 DIAGNOSIS — Z5112 Encounter for antineoplastic immunotherapy: Secondary | ICD-10-CM | POA: Diagnosis not present

## 2022-04-06 LAB — CBC WITH DIFFERENTIAL/PLATELET
Abs Immature Granulocytes: 0.02 10*3/uL (ref 0.00–0.07)
Basophils Absolute: 0 10*3/uL (ref 0.0–0.1)
Basophils Relative: 0 %
Eosinophils Absolute: 0.2 10*3/uL (ref 0.0–0.5)
Eosinophils Relative: 3 %
HCT: 33.2 % — ABNORMAL LOW (ref 39.0–52.0)
Hemoglobin: 10.9 g/dL — ABNORMAL LOW (ref 13.0–17.0)
Immature Granulocytes: 0 %
Lymphocytes Relative: 11 %
Lymphs Abs: 0.6 10*3/uL — ABNORMAL LOW (ref 0.7–4.0)
MCH: 32.3 pg (ref 26.0–34.0)
MCHC: 32.8 g/dL (ref 30.0–36.0)
MCV: 98.5 fL (ref 80.0–100.0)
Monocytes Absolute: 0.8 10*3/uL (ref 0.1–1.0)
Monocytes Relative: 16 %
Neutro Abs: 3.7 10*3/uL (ref 1.7–7.7)
Neutrophils Relative %: 70 %
Platelets: 268 10*3/uL (ref 150–400)
RBC: 3.37 MIL/uL — ABNORMAL LOW (ref 4.22–5.81)
RDW: 15.7 % — ABNORMAL HIGH (ref 11.5–15.5)
WBC: 5.3 10*3/uL (ref 4.0–10.5)
nRBC: 0 % (ref 0.0–0.2)

## 2022-04-06 LAB — COMPREHENSIVE METABOLIC PANEL
ALT: 15 U/L (ref 0–44)
AST: 20 U/L (ref 15–41)
Albumin: 3 g/dL — ABNORMAL LOW (ref 3.5–5.0)
Alkaline Phosphatase: 90 U/L (ref 38–126)
Anion gap: 3 — ABNORMAL LOW (ref 5–15)
BUN: 14 mg/dL (ref 8–23)
CO2: 32 mmol/L (ref 22–32)
Calcium: 9.2 mg/dL (ref 8.9–10.3)
Chloride: 103 mmol/L (ref 98–111)
Creatinine, Ser: 0.98 mg/dL (ref 0.61–1.24)
GFR, Estimated: >60 mL/min (ref >60)
Glucose, Bld: 105 mg/dL — ABNORMAL HIGH (ref 70–99)
Potassium: 4.3 mmol/L (ref 3.5–5.1)
Sodium: 138 mmol/L (ref 135–145)
Total Bilirubin: 0.7 mg/dL (ref 0.3–1.2)
Total Protein: 6.7 g/dL (ref 6.5–8.1)

## 2022-04-06 LAB — IRON AND TIBC
Iron: 49 ug/dL (ref 45–182)
Saturation Ratios: 18 % (ref 17.9–39.5)
TIBC: 272 ug/dL (ref 250–450)
UIBC: 223 ug/dL

## 2022-04-06 LAB — FERRITIN: Ferritin: 81 ng/mL (ref 24–336)

## 2022-04-06 MED ORDER — SODIUM CHLORIDE 0.9 % IV SOLN
200.0000 mg | Freq: Once | INTRAVENOUS | Status: AC
  Administered 2022-04-06: 200 mg via INTRAVENOUS
  Filled 2022-04-06: qty 200

## 2022-04-06 MED ORDER — SODIUM CHLORIDE 0.9 % IV SOLN
Freq: Once | INTRAVENOUS | Status: AC
  Filled 2022-04-06: qty 250

## 2022-04-06 NOTE — Addendum Note (Signed)
Addended by: Vanice Sarah on: 04/06/2022 10:29 AM   Modules accepted: Orders

## 2022-04-06 NOTE — Progress Notes (Signed)
[place] NOTE  Patient Care Team: [name], MD as PCP - General (Infectious Diseases) Cammie Sickle, MD as Consulting Physician (Oncology) Jason Coop, NP as Nurse Practitioner (Hospice and Palliative Medicine) Borders, Kirt Boys, NP as Nurse Practitioner (Hospice and Palliative Medicine)  CHIEF COMPLAINTS/PURPOSE OF CONSULTATION: Bladder cancer  Oncology History Overview Note  # NOV 2020- [incidental/hematuria work-up]-subtle peritoneal thickening/plaque-like lesions; March 2021-CT scan progressive omental/peritoneal thickening  #Microscopic hematuria- sec to BPH [cystoscopy/CT urogram negative; Dr.Stoiff]; March 2021 CT scan-new lesion in the bladder diverticulum  # MAY-June 2021- severe IDA sec to hemauria [hb 8.5; Ferritin- ]  #July 2022-hemorrhagic shock/bladder cancer-s/p radiation [facility]; no chemotherapy.[Dr.Neilsen]  # AUG 11th, 2023- PET scan- Hypermetabolic metastatic osseous lesions, left supraclavicular lymph nodes, right upper lobe nodule and abdominal retroperitoneal adenopathy- BIOPSY- POSITIVE FOR MALIGNANCY; QNS for IHC. BACK RT s/p March 10, 2022 '[]'$   # SEP 7th, 2023- Keytruda single agent.   # on eliquis ? A.fib/pacemaker;   Urothelial carcinoma of bladder ([place])  10/29/2019 Initial Diagnosis   Urothelial carcinoma of bladder (HCC)   Cancer of overlapping sites of bladder ([place])  02/17/2022 Initial Diagnosis   Cancer of overlapping sites of bladder ([place])   02/17/2022 Cancer Staging   Staging form: Urinary Bladder, AJCC 8th Edition - Clinical: Stage Unknown (cTX, cN2, cM1) - Signed by Cammie Sickle, MD on 02/17/2022   03/16/2022 -  Chemotherapy   Patient is on Treatment Plan : BLADDER Pembrolizumab (200) q21d        HISTORY OF PRESENTING ILLNESS: pt ambulating  with a rolling walker ; is accompanied by his daughter  Christopher [name] 88 y.o.  male history of chronic back pain; and multiple  comorbidities with LIKELY recurrent /STAGE IV bladder malignancy [supraclavicular lymph node positive for malignancy; QNS for IHC] currently on single agent Keytruda is here for follow-up.  Patient complains of ongoing right arm pain stable- 3/10 pain scale.  Right shoulder/neck pain is improving, 1/10 pain scale.  Patient is currently taking hydrocodone 2 pills a day.  However, complains of dizzy spells.  No falls.   Has started Mirtazapine for appetite stimulant that is helping with wt gain of 3 lbs.  Constipation alternating with diarrhea-unchanged..  Patient denies any blood in stools.  He denies any dizzy spells.  Review of Systems  Constitutional:  Positive for malaise/fatigue and weight loss. Negative for chills, diaphoresis and fever.  HENT:  Negative for nosebleeds and sore throat.   Eyes:  Negative for double vision.  Respiratory:  Negative for cough, hemoptysis, sputum production and wheezing.   Cardiovascular:  Negative for chest pain, palpitations, orthopnea and leg swelling.  Gastrointestinal:  Positive for constipation. Negative for abdominal pain, blood in stool, diarrhea, heartburn, melena, nausea and vomiting.  Genitourinary:  Positive for hematuria. Negative for dysuria, frequency and urgency.  Musculoskeletal:  Positive for back pain and joint pain.  Skin: Negative.  Negative for itching and rash.  Neurological:  Negative for dizziness, tingling, focal weakness, weakness and headaches.  Endo/Heme/Allergies:  Does not bruise/bleed easily.  Psychiatric/Behavioral:  Negative for depression. The patient is not nervous/anxious and does not have insomnia.      MEDICAL HISTORY:  Past Medical History:  Diagnosis Date   A-fib ([place])    Anemia    Arthritis    BACK   Bladder cancer (HCC)    BPH (benign prostatic hyperplasia)    Cancer (HCC)    skin cancer on face carcinoma  ED (erectile dysfunction)    Rosanna Randy disease    HLD (hyperlipidemia)    Hypertension    Macular  degeneration    Mollusca contagiosa    Osteopenia    Presence of permanent cardiac pacemaker    Sensorineural hearing loss    Squamous cell carcinoma of skin 08/25/2019   R cheek    Squamous cell carcinoma of skin 02/12/2020   L neck    Squamous cell carcinoma of skin 11/30/2020   Right elbow, [facility]   Squamous cell carcinoma of skin 03/16/2021   right base of thumb [facility]   Squamous cell carcinoma of skin 03/16/2021   right preauricular - [facility] 11/17/21   Squamous cell carcinoma of skin 10/05/2021   Right dorsum hand - [facility]   Squamous cell carcinoma of skin 11/22/2021   right inf preauricular anterior, [facility]   Squamous cell carcinoma of skin 11/22/2021   right inf preauricular posterior, [facility]    SURGICAL HISTORY: Past Surgical History:  Procedure Laterality Date   CATARACT EXTRACTION Bilateral    CYSTOSCOPY WITH BIOPSY N/A 01/20/2020   Procedure: CYSTOSCOPY WITH BIOPSY;  Surgeon: Abbie Sons, MD;  Location: [facility] OR[facility];  Service: Urology;  Laterality: N/A;   CYSTOSCOPY WITH FULGERATION N/A 01/20/2020   Procedure: CYSTOSCOPY WITH FULGERATION;  Surgeon: Abbie Sons, MD;  Location: [facility];  Service: Urology;  Laterality: N/A;   CYSTOSCOPY WITH FULGERATION N/A 04/19/2020   Procedure: [place] with clot evacuation;  Surgeon: Abbie Sons, MD;  Location: [facility];  Service: Urology;  Laterality: N/A;   CYSTOSCOPY WITH FULGERATION N/A 01/05/2021   Procedure: CYSTOSCOPY WITH FULGERATION;  Surgeon: Abbie Sons, MD;  Location: [facility];  Service: Urology;  Laterality: N/A;   HEMORRHOIDECTOMY WITH HEMORRHOID BANDING     KNEE ARTHROSCOPY Left    PACEMAKER INSERTION N/A 03/03/2015   Procedure: INSERTION DUAL LEAD PACEMAKER;  Surgeon: Isaias Cowman, MD;  Location: [facility] OR[facility];  Service: Cardiovascular;  Laterality: N/A;   PULSE GENERATOR IMPLANT N/A 10/18/2020   Procedure: UNILATERAL PULSE GENERATOR IMPLANT;  Surgeon: [name], MD;  Location: [facility];  Service:  Neurosurgery;  Laterality: N/A;   SPINAL CORD STIMULATOR TRIAL N/A 10/11/2020   Procedure: PERCUTANEOUS SPINAL CORD STIMULATOR TRIAL;  Surgeon: [name], MD;  Location: [facility];  Service: Neurosurgery;  Laterality: N/A;   THORACIC LAMINECTOMY FOR SPINAL CORD STIMULATOR N/A 10/18/2020   Procedure: PERCUTANEOUS LEAD PLACEMENT;  Surgeon: [name], MD;  Location: [facility];  Service: Neurosurgery;  Laterality: N/A;   TONSILLECTOMY     TRANSURETHRAL RESECTION OF BLADDER TUMOR N/A 10/07/2019   Procedure: TRANSURETHRAL RESECTION OF BLADDER TUMOR (TURBT);  Surgeon: Abbie Sons, MD;  Location: [facility];  Service: Urology;  Laterality: N/A;   TRANSURETHRAL RESECTION OF BLADDER TUMOR N/A 04/13/2020   Procedure: TRANSURETHRAL RESECTION OF BLADDER TUMOR (TURBT);  Surgeon: Abbie Sons, MD;  Location: [facility];  Service: Urology;  Laterality: N/A;    SOCIAL HISTORY: Social History   Socioeconomic History   Marital status: Widowed    Spouse name: Not on file   Number of children: Not on file   Years of education: Not on file   Highest education level: Not on file  Occupational History   Not on file  Tobacco Use   Smoking status: Never   Smokeless tobacco: Never  Vaping Use   Vaping Use: Never used  Substance and Sexual Activity   Alcohol use: Yes    Alcohol/week: 7.0 standard drinks  of alcohol    Types: 7 Glasses of wine per week    Comment: DAILY    Drug use: No   Sexual activity: Yes    Birth control/protection: None  Other Topics Concern   Not on file  Social History Narrative   Retd. Civil engineer, contracting; [place]; self. Never smoked; alcohol- glass wine/drink every night.     Social Determinants of Health   Financial Resource Strain: Not on file  Food Insecurity: Not on file  Transportation Needs: Not on file  Physical Activity: Not on file  Stress: Not on file  Social Connections: Not on file  Intimate Partner Violence: Not on file    FAMILY HISTORY: Family  History  Problem Relation Age of Onset   Diabetes Mother    Lung cancer Father    Cancer Paternal Uncle    Prostate cancer Neg Hx    Bladder Cancer Neg Hx    Kidney cancer Neg Hx     ALLERGIES:  is allergic to atenolol.  MEDICATIONS:  Current Outpatient Medications  Medication Sig Dispense Refill   atorvastatin (LIPITOR) 10 MG tablet Take 10 mg by mouth in the morning.     calcium carbonate (OSCAL) 1500 (600 Ca) MG TABS tablet Take 600 mg of elemental calcium by mouth in the morning.     Cholecalciferol (VITAMIN D3) 50 MCG (2000 UT) TABS Take 2,000 Units by mouth in the morning.     diltiazem (CARDIZEM CD) 240 MG 24 hr capsule Take 1 capsule (240 mg total) by mouth daily. 30 capsule 0   ELIQUIS 5 MG TABS tablet Take 1 tablet by mouth daily.     ferrous sulfate 325 (65 FE) MG tablet Take 325 mg by mouth in the morning and at bedtime.     finasteride (PROSCAR) 5 MG tablet TAKE 1 TABLET DAILY (Patient taking differently: Take 5 mg by mouth daily.) 90 tablet 3   HYDROcodone-acetaminophen (NORCO/VICODIN) 5-325 MG tablet Take 1 tablet by mouth every 8 (eight) hours as needed for moderate pain. 65 tablet 0   metoprolol tartrate (LOPRESSOR) 50 MG tablet Take 1 tablet (50 mg total) by mouth 2 (two) times daily. (Patient taking differently: Take 50 mg by mouth 2 (two) times daily. 2 in the morning and 2 qhs) 60 tablet 0   mirtazapine (REMERON) 15 MG tablet Take 15 mg by mouth at bedtime.     Multiple Vitamin (MULTIVITAMIN WITH MINERALS) TABS tablet Take 1 tablet by mouth in the morning.     Multiple Vitamins-Minerals (PRESERVISION AREDS 2 PO) Take 1 tablet by mouth in the morning and at bedtime.     Omega-3 Fatty Acids (FISH OIL) 1000 MG CAPS Take 1,000 mg by mouth daily.     tamsulosin (FLOMAX) 0.4 MG CAPS capsule Take 0.4 mg by mouth daily.     traZODone (DESYREL) 50 MG tablet Take 50 mg by mouth at bedtime.     predniSONE (DELTASONE) 20 MG tablet Take 2 pills a day for 1 week; and then 1 pill  a day for 1 week.  Taking the morning with food. (Patient not taking: Reported on 04/06/2022) 40 tablet 0   No current facility-administered medications for this visit.      Marland Kitchen  PHYSICAL EXAMINATION: ECOG PERFORMANCE STATUS: 0 - Asymptomatic  Vitals:   04/06/22 0900  BP: 133/71  Pulse: 74  Resp: 16  Temp: 97.7 F (36.5 C)     There were no vitals filed for this visit.   Frail-appearing  Caucasian male patient.   Physical Exam HENT:     Head: Normocephalic and atraumatic.     Mouth/Throat:     Pharynx: No oropharyngeal exudate.  Eyes:     Pupils: Pupils are equal, round, and reactive to light.  Cardiovascular:     Rate and Rhythm: Normal rate and regular rhythm.  Pulmonary:     Effort: No respiratory distress.     Breath sounds: No wheezing.  Abdominal:     General: Bowel sounds are normal. There is no distension.     Palpations: Abdomen is soft. There is no mass.     Tenderness: There is no abdominal tenderness. There is no guarding or rebound.  Musculoskeletal:        General: No tenderness. Normal range of motion.     Cervical back: Normal range of motion and neck supple.  Skin:    General: Skin is warm.  Neurological:     Mental Status: He is alert and oriented to person, place, and time.  Psychiatric:        Mood and Affect: Affect normal.     LABORATORY DATA:  I have reviewed the data as listed Lab Results  Component Value Date   WBC 5.3 04/06/2022   HGB 10.9 (L) 04/06/2022   HCT 33.2 (L) 04/06/2022   MCV 98.5 04/06/2022   PLT 268 04/06/2022   Recent Labs    03/06/22 0949 03/16/22 0934 04/06/22 0852  NA 139 138 138  K 3.9 4.7 4.3  CL 102 102 103  CO2 31 31 32  GLUCOSE 149* 112* 105*  BUN 27* 21 14  CREATININE 0.93 1.07 0.98  CALCIUM 9.2 9.1 9.2  GFRNONAA >60 >60 >60  PROT 6.7 6.3* 6.7  ALBUMIN 3.1* 3.1* 3.0*  AST 42* 21 20  ALT 83* 29 15  ALKPHOS 86 83 90  BILITOT 0.8 1.0 0.7    RADIOGRAPHIC STUDIES: I have personally reviewed  the radiological images as listed and agreed with the findings in the report. DG Forearm Right  Result Date: 03/16/2022 CLINICAL DATA:  Pain in right arm after working in the yard yesterday. Pain radiates from elbow to wrist. History of bladder cancer. Recent PET-CT with findings of multiple hypermetabolic osseous lesions. EXAM: RIGHT FOREARM - 2 VIEW COMPARISON:  None Available. FINDINGS: Diffuse osteopenia. No fracture. A 3.8 cm permeative osteolytic cortical lesion is seen in the mid ulnar diaphysis and corresponds to focal area of radiotracer uptake on PET-CT 02/16/2022. The lesion involves approximately 1/3 of the bone diameter. Small enthesophyte noted at the olecranon process. Vascular calcifications in the visualized right arm. IMPRESSION: No fracture. A 3.8 cm permeative osteolytic cortical lesion in the mid ulnar diaphysis corresponds to focal area of radiotracer uptake on PET-CT 02/16/2022, likely reflecting an osseous metastasis given history of bladder cancer. These results will be called to the ordering clinician or representative by the Radiologist Assistant, and communication documented in the PACS or [employer]. Electronically Signed   By: Ileana Roup M.D.   On: 03/16/2022 13:28    ASSESSMENT & PLAN:   Cancer of overlapping sites of bladder [facility])  JUNE 2022- CT [facility]- Multiple recurrent high-grade-suspicious for muscle invasive urothelial malignancy; possible extravascular extension of the tumor [biopsy-[facility] negative for muscle invasion].  S/[facility]  [facility].  S/p cystoscopy- NEG. AUG 11th, 2023- PET scan- Hypermetabolic metastatic osseous lesions, left supraclavicular lymph nodes, right upper lobe nodule and abdominal retroperitoneal adenopathy-highly concerning for malignancy.  Suprclaviulcar LN- biopsy- positive  for malignancy; QNS for IHC. Currently on keytruda on palliative basis.   # Proceed with Keytruda. Labs today reviewed;  acceptable for treatment today. We will plan to  have imaging after 3-4 cycles.  # Lumbar back pain-likely secondary to metastatic lesions-currently s/p radiation oncology for palliative radiation [sep 1st 2023].  Continue hydrocodone q 8-12 hours prn. Consider 1/2 hydrocodone  Right arm pain-sec to metastatic disease- Discussed with Dr.Chrystal re: possible RT.    # Iron deficiency anemia secondary to chronic blood loss/hematuria [see below]- S/p  IV Venofer; Today Hb-10.9 check iron studies today.  If low would recommend IV iron infusion.  # IV access: PIV for now.   # DISPOSITION: # Add iron studies;ferritin today's labs  # Keytruda today; NO IVFs  # follow up in 3 weeks- MD; labs- cbc/cmp; Beryle Flock-; possible IVFs/venofer-. Over 1 hour-Dr.B          All questions were answered. The patient knows to call the clinic with any problems, questions or concerns.    Cammie Sickle, MD 04/06/2022 9:59 AM

## 2022-04-06 NOTE — Progress Notes (Signed)
Right arm pain stable, 3/10 pain scale.  Right shoulder/neck pain is improving, 1/10 pain scale.  Has started Mirtazapine for appetite stimulant that is helping with wt gain of 3 lbs.

## 2022-04-06 NOTE — Patient Instructions (Signed)
[facility] [place]  Discharge Instructions: Thank you for choosing [facility] to provide your oncology and hematology care.  If you have a lab appointment with the Cancer Center, please go directly to the Cancer Center and check in at the registration area.  Wear comfortable clothing and clothing appropriate for easy access to any Portacath or PICC line.   We strive to give you quality time with your provider. You may need to reschedule your appointment if you arrive late (15 or more minutes).  Arriving late affects you and other patients whose appointments are after yours.  Also, if you miss three or more appointments without notifying the office, you may be dismissed from the clinic at the provider's discretion.      For prescription refill requests, have your pharmacy contact our office and allow 72 hours for refills to be completed.    Today you received the following chemotherapy and/or immunotherapy agents Keytruda      To help prevent nausea and vomiting after your treatment, we encourage you to take your nausea medication as directed.  BELOW ARE SYMPTOMS THAT SHOULD BE REPORTED IMMEDIATELY: *FEVER GREATER THAN 100.4 F (38 C) OR HIGHER *CHILLS OR SWEATING *NAUSEA AND VOMITING THAT IS NOT CONTROLLED WITH YOUR NAUSEA MEDICATION *UNUSUAL SHORTNESS OF BREATH *UNUSUAL BRUISING OR BLEEDING *URINARY PROBLEMS (pain or burning when urinating, or frequent urination) *BOWEL PROBLEMS (unusual diarrhea, constipation, pain near the anus) TENDERNESS IN MOUTH AND THROAT WITH OR WITHOUT PRESENCE OF ULCERS (sore throat, sores in mouth, or a toothache) UNUSUAL RASH, SWELLING OR PAIN  UNUSUAL VAGINAL DISCHARGE OR ITCHING   Items with * indicate a potential emergency and should be followed up as soon as possible or go to the Emergency Department if any problems should occur.  Please show the CHEMOTHERAPY ALERT CARD or IMMUNOTHERAPY ALERT CARD at check-in to  the Emergency Department and triage nurse.  Should you have questions after your visit or need to cancel or reschedule your appointment, please contact [facility] [place]  [phone] and follow the prompts.  Office hours are 8:00 a.m. to 4:30 p.m. Monday - Friday. Please note that voicemails left after 4:00 p.m. may not be returned until the following business day.  We are closed weekends and major holidays. You have access to a nurse at all times for urgent questions. Please call the main number to the clinic [phone] and follow the prompts.  For any non-urgent questions, you may also contact your provider using MyChart. We now offer e-Visits for anyone 18 and older to request care online for non-urgent symptoms. For details visit [email or web address].   Also download the MyChart app! Go to the app store, search "MyChart", open the app, select [place], and log in with your MyChart username and password.  Masks are optional in the cancer centers. If you would like for your care team to wear a mask while they are taking care of you, please let them know. For doctor visits, patients may have with them one support person who is at least 86 years old. At this time, visitors are not allowed in the infusion area.   

## 2022-04-06 NOTE — Assessment & Plan Note (Addendum)
JUNE 2022- CT [facility]- Multiple recurrent high-grade-suspicious for muscle invasive urothelial malignancy; possible extravascular extension of the tumor [biopsy-[facility] negative for muscle invasion].  S/p  radiation at [facility].  S/p cystoscopy- NEG. AUG 11th, 2023- PET scan- Hypermetabolic metastatic osseous lesions, left supraclavicular lymph nodes, right upper lobe nodule and abdominal retroperitoneal adenopathy-highly concerning for malignancy.  Suprclaviulcar LN- biopsy- positive for malignancy; QNS for IHC. Currently on keytruda on palliative basis.   # Proceed with Keytruda. Labs today reviewed;  acceptable for treatment today. We will plan to have imaging after 3-4 cycles.  # Lumbar back pain-likely secondary to metastatic lesions-currently s/p radiation oncology for palliative radiation [sep 1st 2023].  Continue hydrocodone q 8-12 hours prn. Consider 1/2 hydrocodone  Right arm pain-sec to metastatic disease- Discussed with Dr.Chrystal re: possible RT.    # Iron deficiency anemia secondary to chronic blood loss/hematuria [see below]- S/p  IV Venofer; Today Hb-10.9 check iron studies today.  If low would recommend IV iron infusion.  # IV access: PIV for now.   # DISPOSITION: # Add iron studies;ferritin today's labs  # Keytruda today; NO IVFs  # follow up in 3 weeks- MD; labs- cbc/cmp; Beryle Flock-; possible IVFs/venofer-. Over 1 hour-Dr.B

## 2022-04-11 ENCOUNTER — Ambulatory Visit
Admission: RE | Admit: 2022-04-11 | Discharge: 2022-04-11 | Disposition: A | Discharge status: Home / Self Care | Payer: Medicare HMO | Source: Ambulatory Visit | Attending: Radiation Oncology | Admitting: Radiation Oncology

## 2022-04-11 VITALS — BP 118/76 | HR 63 | Temp 97.0°F | Resp 16 | Ht 71.0 in | Wt 168.0 lb

## 2022-04-11 DIAGNOSIS — C7951 Secondary malignant neoplasm of bone: Secondary | ICD-10-CM

## 2022-04-11 DIAGNOSIS — C679 Malignant neoplasm of bladder, unspecified: Secondary | ICD-10-CM

## 2022-04-11 NOTE — Progress Notes (Signed)
Radiation Oncology Follow up Note spine metastasis old patient new area  Name: Christopher [name]   Date:   04/11/2022 MRN:  [id number] DOB: Aug 14, 1927    This 88 y.o. male presents to the clinic today for reevaluation of spinal metastasis in the lower lumbar spine and pelvis in patient with known stage IV bladder cancer previously treated to his upper lumbar spine.  REFERRING PROVIDER: [name], MD  HPI: Patient is a 88 year old male well-known to our department had received palliative radiation therapy to his upper lumbar spine for metastatic involvement of stage IV bladder cancer.  He has achieved good palliation in that region of his spine.  He is complaining today mostly of lower back pain more accentuated on the left side.  He is also having pain and discomfort in his right upper extremity although there is no PET/CT correlate for any disease in that region.  He does have hypermetabolic activity in the lower lumbar spine as well as the left SI joint region..  COMPLICATIONS OF TREATMENT: none  FOLLOW UP COMPLIANCE: keeps appointments   PHYSICAL EXAM:  BP 118/76   Pulse 63   Temp (!) 97 F (36.1 C)   Resp 16   Ht '5\' 11"'$  (1.803 m)   Wt 168 lb (76.2 kg)   BMI 23.43 kg/m  Range of motion of his lower extremities does not elicit pain.  Deep palpation of his spine is somewhat sensitive to deep compression.  Motor and sensory levels in the lower extremities are equal and symmetric.  Well-developed well-nourished patient in NAD. HEENT reveals PERLA, EOMI, discs not visualized.  Oral cavity is clear. No oral mucosal lesions are identified. Neck is clear without evidence of cervical or supraclavicular adenopathy. Lungs are clear to A&P. Cardiac examination is essentially unremarkable with regular rate and rhythm without murmur rub or thrill. Abdomen is benign with no organomegaly or masses noted. Motor sensory and DTR levels are equal and symmetric in the upper and lower extremities.  Cranial nerves II through XII are grossly intact. Proprioception is intact. No peripheral adenopathy or edema is identified. No motor or sensory levels are noted. Crude visual fields are within normal range.  RADIOLOGY RESULTS: PET CT scan reviewed compatible with above-stated findings  PLAN: At this time I can find no correlate for hypermetabolic activity in his right upper extremity.  He does have metastatic involvement of the lower lumbar spine as well as small area in the left SI joint elected to treat both those areas with 30 Gray in 10 fractions.  Risks and benefits of treatment including possible diarrhea fatigue alteration blood counts and skin reaction were reviewed.  I have personally set up and ordered CT simulation for later this week.  We will notify Dr. B that I really do not see anything to treat in his right upper extremity.  I would like to take this opportunity to thank you for allowing me to participate in the care of your patient.Noreene Filbert, MD

## 2022-04-13 ENCOUNTER — Telehealth: Payer: Medicare HMO | Admitting: Primary Care

## 2022-04-13 ENCOUNTER — Ambulatory Visit
Admission: RE | Admit: 2022-04-13 | Discharge: 2022-04-13 | Disposition: A | Discharge status: Home / Self Care | Payer: Medicare HMO | Source: Ambulatory Visit | Attending: Radiation Oncology | Admitting: Radiation Oncology

## 2022-04-13 DIAGNOSIS — C678 Malignant neoplasm of overlapping sites of bladder: Secondary | ICD-10-CM | POA: Insufficient documentation

## 2022-04-13 DIAGNOSIS — C7951 Secondary malignant neoplasm of bone: Secondary | ICD-10-CM | POA: Diagnosis not present

## 2022-04-13 DIAGNOSIS — Z510 Encounter for antineoplastic radiation therapy: Secondary | ICD-10-CM | POA: Diagnosis not present

## 2022-04-14 ENCOUNTER — Other Ambulatory Visit: Payer: Self-pay

## 2022-04-15 ENCOUNTER — Encounter: Payer: Self-pay | Admitting: Internal Medicine

## 2022-04-17 ENCOUNTER — Other Ambulatory Visit: Payer: Self-pay | Admitting: *Deleted

## 2022-04-17 ENCOUNTER — Telehealth: Payer: Medicare HMO | Admitting: Primary Care

## 2022-04-17 ENCOUNTER — Encounter: Payer: Self-pay | Admitting: Internal Medicine

## 2022-04-17 DIAGNOSIS — R531 Weakness: Secondary | ICD-10-CM

## 2022-04-17 DIAGNOSIS — K669 Disorder of peritoneum, unspecified: Secondary | ICD-10-CM

## 2022-04-17 DIAGNOSIS — C678 Malignant neoplasm of overlapping sites of bladder: Secondary | ICD-10-CM

## 2022-04-17 DIAGNOSIS — Z515 Encounter for palliative care: Secondary | ICD-10-CM

## 2022-04-17 DIAGNOSIS — Z510 Encounter for antineoplastic radiation therapy: Secondary | ICD-10-CM | POA: Diagnosis not present

## 2022-04-17 MED ORDER — HYDROCODONE-ACETAMINOPHEN 5-325 MG PO TABS: 1.0000 | ORAL_TABLET | Freq: Three times a day (TID) | ORAL | 0 refills | Status: DC | PRN

## 2022-04-17 NOTE — Progress Notes (Signed)
[facility] Consult Note Telephone: [phone]  Fax: [phone]    Date of encounter: 04/17/22 12:41 PM PATIENT NAME: Christopher [name] [address] [place] [address]   [phone] (home) [phone] (work) DOB: 31-Jul-1927 MRN: [id number] PRIMARY CARE PROVIDER:    [name], MD,  [place] [address] [phone]  REFERRING PROVIDER:   [name], MD [place],  [place] [address] [phone]  RESPONSIBLE PARTY:    Contact Information     Name Relation Home Work Christopher [name] Daughter [phone]  [phone]   Christopher [name], Christopher [name] Daughter   [phone]       Due to the COVID-19 crisis, this visit was done via telemedicine from my office and it was initiated and consent by this patient and or family.  I connected with  Christopher [name] OR PROXY on 04/17/22 by a video enabled telemedicine application and verified that I am speaking with the correct person using two identifiers.   I discussed the limitations of evaluation and management by telemedicine. The patient expressed understanding and agreed to proceed.   I met face to face with patient and family in  home. Palliative Care was asked to follow this patient by consultation request of  [name], MD to address advance care planning and complex medical decision making. This is a follow up visit.                                   ASSESSMENT AND PLAN / RECOMMENDATIONS:   Advance Care Planning/Goals of Care: Goals include to maximize quality of life and symptom management. Patient/health care surrogate gave his/her permission to discuss.Our advance care planning conversation included a discussion about:     Exploration of personal, cultural or spiritual beliefs that might influence medical decisions  Exploration of goals of care in the event of a sudden injury or illness  Patient wants  to continue out patient cancer treatments, eg keytruda treatments and upcoming radiation treatments.  Symptom Management/Plan:  Pain: increased pain. I recommended he take the prescribed amount. He states he is taking half the prescribed amount. Discussed severity of bone pain. Will commence on a course of pall rad tx soon.  Mobility: Endorses he is able to get around, no falls.   Anorexia: patient endorses still having anorexia on 15 mg mirtazipine. Recommend increase to 22.5 mg. I will ask PCP or  J Borders to prescribe going forward due to the end of our home NP visiting program.Has met with RD and is taking hi calorie supplements.  Bowel program: Endorses constipation. Has taken miralax on and off. Explained titration and asked him to work on daily  bowel program to achieve soft formed stools daily or QOD, discussed increase of constipation when he increased opioids.  Follow up Palliative Care Visit: Palliative care will continue to follow for complex medical decision making, advance care planning, and clarification of goals. Return 4-6 weeks or prn by [facility] clinicians; NP home visits ending.  This visit was coded based on medical decision making (MDM).  PPS: 40%  HOSPICE ELIGIBILITY/DIAGNOSIS: yes with concordant goals of care  Chief Complaint: pain, constipation, anorexia  HISTORY OF PRESENT ILLNESS:  Christopher [name] is a 88 y.o. year old male  with cancer with mets, anorexia, pain and constipation . Patient seen today to review palliative care  needs to include medical decision making and advance care planning as appropriate.   History obtained from review of EMR, discussion with primary team, and interview with family, facility staff/caregiver and/or Christopher [name].  I reviewed available labs, medications, imaging, studies and related documents from the EMR.  Records reviewed and summarized above.   ROS  General: NAD ENMT: denies dysphagia Pulmonary: denies cough, denies  increased SOB Abdomen: endorses poor appetite, endorses constipation, endorses continence of bowel GU: denies dysuria, endorses continence of urine MSK:  endorses increased weakness,  no falls reported Skin: denies rashes or wounds Neurological: endorses  pain, denies insomnia Psych: Endorses depressed mood  Physical Exam: Current and past weights: 168 lbs reported Constitutional: NAD Remaining deferred   Thank you for the opportunity to participate in the care of Christopher [name].  The palliative care team will continue to follow. Please call our office at [phone] if we can be of additional assistance.   Jason Coop, NP DNP, AGPCNP-BC

## 2022-04-18 ENCOUNTER — Telehealth: Payer: Self-pay | Admitting: Primary Care

## 2022-04-18 NOTE — Telephone Encounter (Signed)
Received call back from daughter, will hold trazodone for now as he's on mirtazapine and we're going up on that to 22.5 mg hs.

## 2022-04-18 NOTE — Telephone Encounter (Signed)
T/c to inquire about current medication regimen to hand off to J Borders for f/u in clinic. No answer, message left to call me or Dr Regenia Skeeter with information.

## 2022-04-19 ENCOUNTER — Ambulatory Visit
Admission: RE | Admit: 2022-04-19 | Discharge: 2022-04-19 | Disposition: A | Discharge status: Home / Self Care | Payer: Medicare HMO | Source: Ambulatory Visit | Attending: Radiation Oncology | Admitting: Radiation Oncology

## 2022-04-19 ENCOUNTER — Other Ambulatory Visit: Payer: Self-pay

## 2022-04-19 ENCOUNTER — Ambulatory Visit: Payer: Medicare HMO | Admitting: Radiation Oncology

## 2022-04-19 DIAGNOSIS — Z510 Encounter for antineoplastic radiation therapy: Secondary | ICD-10-CM | POA: Diagnosis not present

## 2022-04-19 LAB — RAD ONC ARIA SESSION SUMMARY
Course Elapsed Days: 0
Plan Fractions Treated to Date: 1
Plan Prescribed Dose Per Fraction: 8 Gy
Plan Total Fractions Prescribed: 1
Plan Total Prescribed Dose: 8 Gy
Reference Point Dosage Given to Date: 8 Gy
Reference Point Session Dosage Given: 8 Gy
Session Number: 1

## 2022-04-20 ENCOUNTER — Ambulatory Visit: Admission: RE | Admit: 2022-04-20 | Payer: Medicare HMO | Source: Ambulatory Visit

## 2022-04-20 DIAGNOSIS — Z510 Encounter for antineoplastic radiation therapy: Secondary | ICD-10-CM | POA: Diagnosis not present

## 2022-04-24 ENCOUNTER — Ambulatory Visit
Admission: RE | Admit: 2022-04-24 | Discharge: 2022-04-24 | Disposition: A | Discharge status: Home / Self Care | Payer: Medicare HMO | Source: Ambulatory Visit | Attending: Radiation Oncology | Admitting: Radiation Oncology

## 2022-04-24 ENCOUNTER — Other Ambulatory Visit: Payer: Self-pay

## 2022-04-24 DIAGNOSIS — Z510 Encounter for antineoplastic radiation therapy: Secondary | ICD-10-CM | POA: Diagnosis not present

## 2022-04-24 LAB — RAD ONC ARIA SESSION SUMMARY
Course Elapsed Days: 5
Plan Fractions Treated to Date: 1
Plan Prescribed Dose Per Fraction: 3 Gy
Plan Total Fractions Prescribed: 10
Plan Total Prescribed Dose: 30 Gy
Reference Point Dosage Given to Date: 3 Gy
Reference Point Session Dosage Given: 3 Gy
Session Number: 2

## 2022-04-25 ENCOUNTER — Ambulatory Visit
Admission: RE | Admit: 2022-04-25 | Discharge: 2022-04-25 | Disposition: A | Discharge status: Home / Self Care | Payer: Medicare HMO | Source: Ambulatory Visit | Attending: Radiation Oncology | Admitting: Radiation Oncology

## 2022-04-25 ENCOUNTER — Other Ambulatory Visit: Payer: Self-pay

## 2022-04-25 DIAGNOSIS — Z510 Encounter for antineoplastic radiation therapy: Secondary | ICD-10-CM | POA: Diagnosis not present

## 2022-04-25 LAB — RAD ONC ARIA SESSION SUMMARY
Course Elapsed Days: 6
Plan Fractions Treated to Date: 2
Plan Prescribed Dose Per Fraction: 3 Gy
Plan Total Fractions Prescribed: 10
Plan Total Prescribed Dose: 30 Gy
Reference Point Dosage Given to Date: 6 Gy
Reference Point Session Dosage Given: 3 Gy
Session Number: 3

## 2022-04-26 ENCOUNTER — Other Ambulatory Visit: Payer: Self-pay

## 2022-04-26 ENCOUNTER — Ambulatory Visit
Admission: RE | Admit: 2022-04-26 | Discharge: 2022-04-26 | Disposition: A | Discharge status: Home / Self Care | Payer: Medicare HMO | Source: Ambulatory Visit | Attending: Radiation Oncology | Admitting: Radiation Oncology

## 2022-04-26 DIAGNOSIS — Z510 Encounter for antineoplastic radiation therapy: Secondary | ICD-10-CM | POA: Diagnosis not present

## 2022-04-26 LAB — RAD ONC ARIA SESSION SUMMARY
Course Elapsed Days: 7
Plan Fractions Treated to Date: 3
Plan Prescribed Dose Per Fraction: 3 Gy
Plan Total Fractions Prescribed: 10
Plan Total Prescribed Dose: 30 Gy
Reference Point Dosage Given to Date: 9 Gy
Reference Point Session Dosage Given: 3 Gy
Session Number: 4

## 2022-04-27 ENCOUNTER — Inpatient Hospital Stay: Payer: Medicare HMO

## 2022-04-27 ENCOUNTER — Inpatient Hospital Stay: Payer: Medicare HMO | Attending: Oncology

## 2022-04-27 ENCOUNTER — Inpatient Hospital Stay: Payer: Medicare HMO | Admitting: Internal Medicine

## 2022-04-27 ENCOUNTER — Other Ambulatory Visit: Payer: Self-pay

## 2022-04-27 ENCOUNTER — Encounter: Payer: Self-pay | Admitting: Internal Medicine

## 2022-04-27 ENCOUNTER — Telehealth: Payer: Self-pay | Admitting: *Deleted

## 2022-04-27 ENCOUNTER — Encounter: Payer: Self-pay | Admitting: Hospice and Palliative Medicine

## 2022-04-27 ENCOUNTER — Ambulatory Visit
Admission: RE | Admit: 2022-04-27 | Discharge: 2022-04-27 | Disposition: A | Discharge status: Home / Self Care | Payer: Medicare HMO | Source: Ambulatory Visit | Attending: Radiation Oncology | Admitting: Radiation Oncology

## 2022-04-27 VITALS — BP 131/67 | Temp 98.8°F | Resp 17

## 2022-04-27 DIAGNOSIS — G893 Neoplasm related pain (acute) (chronic): Secondary | ICD-10-CM | POA: Diagnosis not present

## 2022-04-27 DIAGNOSIS — D500 Iron deficiency anemia secondary to blood loss (chronic): Secondary | ICD-10-CM

## 2022-04-27 DIAGNOSIS — C678 Malignant neoplasm of overlapping sites of bladder: Secondary | ICD-10-CM

## 2022-04-27 DIAGNOSIS — I10 Essential (primary) hypertension: Secondary | ICD-10-CM | POA: Insufficient documentation

## 2022-04-27 DIAGNOSIS — M545 Low back pain, unspecified: Secondary | ICD-10-CM | POA: Insufficient documentation

## 2022-04-27 DIAGNOSIS — I4891 Unspecified atrial fibrillation: Secondary | ICD-10-CM | POA: Diagnosis not present

## 2022-04-27 DIAGNOSIS — Z923 Personal history of irradiation: Secondary | ICD-10-CM | POA: Insufficient documentation

## 2022-04-27 DIAGNOSIS — Z5112 Encounter for antineoplastic immunotherapy: Secondary | ICD-10-CM | POA: Diagnosis present

## 2022-04-27 DIAGNOSIS — Z85828 Personal history of other malignant neoplasm of skin: Secondary | ICD-10-CM | POA: Insufficient documentation

## 2022-04-27 DIAGNOSIS — C7951 Secondary malignant neoplasm of bone: Secondary | ICD-10-CM | POA: Diagnosis not present

## 2022-04-27 DIAGNOSIS — Z79899 Other long term (current) drug therapy: Secondary | ICD-10-CM | POA: Insufficient documentation

## 2022-04-27 DIAGNOSIS — Z510 Encounter for antineoplastic radiation therapy: Secondary | ICD-10-CM | POA: Diagnosis not present

## 2022-04-27 DIAGNOSIS — R630 Anorexia: Secondary | ICD-10-CM

## 2022-04-27 LAB — RAD ONC ARIA SESSION SUMMARY
Course Elapsed Days: 8
Plan Fractions Treated to Date: 4
Plan Prescribed Dose Per Fraction: 3 Gy
Plan Total Fractions Prescribed: 10
Plan Total Prescribed Dose: 30 Gy
Reference Point Dosage Given to Date: 12 Gy
Reference Point Session Dosage Given: 3 Gy
Session Number: 5

## 2022-04-27 LAB — COMPREHENSIVE METABOLIC PANEL
ALT: 20 U/L (ref 0–44)
AST: 30 U/L (ref 15–41)
Albumin: 3.1 g/dL — ABNORMAL LOW (ref 3.5–5.0)
Alkaline Phosphatase: 99 U/L (ref 38–126)
Anion gap: 9 (ref 5–15)
BUN: 19 mg/dL (ref 8–23)
CO2: 30 mmol/L (ref 22–32)
Calcium: 9.4 mg/dL (ref 8.9–10.3)
Chloride: 99 mmol/L (ref 98–111)
Creatinine, Ser: 0.94 mg/dL (ref 0.61–1.24)
GFR, Estimated: >60 mL/min (ref >60)
Glucose, Bld: 111 mg/dL — ABNORMAL HIGH (ref 70–99)
Potassium: 4.7 mmol/L (ref 3.5–5.1)
Sodium: 138 mmol/L (ref 135–145)
Total Bilirubin: 0.6 mg/dL (ref 0.3–1.2)
Total Protein: 7.2 g/dL (ref 6.5–8.1)

## 2022-04-27 LAB — CBC WITH DIFFERENTIAL/PLATELET
Abs Immature Granulocytes: 0.03 10*3/uL (ref 0.00–0.07)
Basophils Absolute: 0 10*3/uL (ref 0.0–0.1)
Basophils Relative: 0 %
Eosinophils Absolute: 0.2 10*3/uL (ref 0.0–0.5)
Eosinophils Relative: 3 %
HCT: 34 % — ABNORMAL LOW (ref 39.0–52.0)
Hemoglobin: 10.7 g/dL — ABNORMAL LOW (ref 13.0–17.0)
Immature Granulocytes: 1 %
Lymphocytes Relative: 8 %
Lymphs Abs: 0.5 10*3/uL — ABNORMAL LOW (ref 0.7–4.0)
MCH: 31.5 pg (ref 26.0–34.0)
MCHC: 31.5 g/dL (ref 30.0–36.0)
MCV: 100 fL (ref 80.0–100.0)
Monocytes Absolute: 0.7 10*3/uL (ref 0.1–1.0)
Monocytes Relative: 13 %
Neutro Abs: 4.2 10*3/uL (ref 1.7–7.7)
Neutrophils Relative %: 75 %
Platelets: 325 10*3/uL (ref 150–400)
RBC: 3.4 MIL/uL — ABNORMAL LOW (ref 4.22–5.81)
RDW: 14.3 % (ref 11.5–15.5)
WBC: 5.6 10*3/uL (ref 4.0–10.5)
nRBC: 0 % (ref 0.0–0.2)

## 2022-04-27 LAB — TSH: TSH: 2.406 u[IU]/mL (ref 0.350–4.500)

## 2022-04-27 MED ORDER — MORPHINE SULFATE ER 30 MG PO TBCR: 30.0000 mg | EXTENDED_RELEASE_TABLET | Freq: Two times a day (BID) | ORAL | 0 refills | Status: DC

## 2022-04-27 MED ORDER — SODIUM CHLORIDE 0.9 % IV SOLN
Freq: Once | INTRAVENOUS | Status: AC
  Filled 2022-04-27: qty 250

## 2022-04-27 MED ORDER — SODIUM CHLORIDE 0.9 % IV SOLN
200.0000 mg | Freq: Once | INTRAVENOUS | Status: AC
  Administered 2022-04-27: 200 mg via INTRAVENOUS
  Filled 2022-04-27: qty 200

## 2022-04-27 MED ORDER — SODIUM CHLORIDE 0.9 % IV SOLN
200.0000 mg | Freq: Once | INTRAVENOUS | Status: AC
  Administered 2022-04-27: 200 mg via INTRAVENOUS
  Filled 2022-04-27: qty 8

## 2022-04-27 NOTE — Progress Notes (Signed)
Nutrition Follow-up:  Patient with metastatic bladder cancer.  Patient receiving palliation radiation.  Patient receiving keytruda  Met with patient during infusion.  Patient reports that appetite is fair.  Eating yogurt and fruit for breakfast mostly or cereal.  Lunch is tuna fish or BLT sandwich or cottage cheese and fruit.  Soups for dinner or pasta meals.  Says that he drinks shake 1 a day.   Requesting that RD call daughter.  Daughter reports improvement in appetite.    Medications: mirtazapine  Labs: reviewed  Anthropometrics:   Weight 169 lb 3.2 oz (does not feel this is related to fluid in ankles or legs)  159 lb 03/06/22 169 lb on 07/27/20   NUTRITION DIAGNOSIS: Inadequate oral intake stable    INTERVENTION:  RD called and spoke with daughter.   Continue oral nutrition supplement shakes 1-2 times per day Continue high calorie, high protein foods Emailed daughter RD contact inforamation    MONITORING, EVALUATION, GOAL: weight trends, intake   NEXT VISIT: as needed  Christopher [name], [place], [place] Registered Dietitian [phone]

## 2022-04-27 NOTE — Telephone Encounter (Signed)
Patient requesting increase dose of remeron. Patient currently on remeron 15 mg.   "Dear Merrily Pew, Would it be possible to get my father, Christopher [name], crescenzo stronger dose of the Mirtazapine (sp?). Thank you, Burna Sis"

## 2022-04-27 NOTE — Progress Notes (Signed)
[place] NOTE  Patient Care Team: [name], MD as PCP - General (Infectious Diseases) Cammie Sickle, MD as Consulting Physician (Oncology) Jason Coop, NP as Nurse Practitioner (Hospice and Palliative Medicine) Borders, Kirt Boys, NP as Nurse Practitioner (Hospice and Palliative Medicine)  CHIEF COMPLAINTS/PURPOSE OF CONSULTATION: Bladder cancer  Oncology History Overview Note  # NOV 2020- [incidental/hematuria work-up]-subtle peritoneal thickening/plaque-like lesions; March 2021-CT scan progressive omental/peritoneal thickening  #Microscopic hematuria- sec to BPH [cystoscopy/CT urogram negative; Dr.Stoiff]; March 2021 CT scan-new lesion in the bladder diverticulum  # MAY-June 2021- severe IDA sec to hemauria [hb 8.5; Ferritin- ]  #July 2022-hemorrhagic shock/bladder cancer-s/p radiation [facility]; no chemotherapy.[Dr.Neilsen]  # AUG 11th, 2023- PET scan- Hypermetabolic metastatic osseous lesions, left supraclavicular lymph nodes, right upper lobe nodule and abdominal retroperitoneal adenopathy- BIOPSY- POSITIVE FOR MALIGNANCY; QNS for IHC. BACK RT s/p March 10, 2022 '[]'$   # SEP 7th, 2023- Keytruda single agent.   # on eliquis ? A.fib/pacemaker;   Urothelial carcinoma of bladder ([place])  10/29/2019 Initial Diagnosis   Urothelial carcinoma of bladder (HCC)   Cancer of overlapping sites of bladder ([place])  02/17/2022 Initial Diagnosis   Cancer of overlapping sites of bladder ([place])   02/17/2022 Cancer Staging   Staging form: Urinary Bladder, AJCC 8th Edition - Clinical: Stage Unknown (cTX, cN2, cM1) - Signed by Cammie Sickle, MD on 02/17/2022   03/16/2022 -  Chemotherapy   Patient is on Treatment Plan : BLADDER Pembrolizumab (200) q21d        HISTORY OF PRESENTING ILLNESS: pt ambulating  with a rolling walker ; is accompanied by his daughter  Christopher [name] 88 y.o.  male history of chronic back pain; and multiple  comorbidities with LIKELY recurrent /STAGE IV bladder malignancy [supraclavicular lymph node positive for malignancy; QNS for IHC] currently on single agent Keytruda is here for follow-up.  In the interim he has been evaluated by radiation oncology for his right arm pain-also his low back pain.  He is currently getting radiation.  Patient taking hydrocodone up to 3 pills a day.  He is he continues to feel/lower back pain.  Has started Mirtazapine for appetite stimulant that is helping with wt gain of 3 lbs.  Constipation alternating with diarrhea-unchanged..  Patient denies any blood in stools.  He denies any dizzy spells.  Complains of jerking-like movement random of the extremities.  No seizure-like activity.  Review of Systems  Constitutional:  Positive for malaise/fatigue and weight loss. Negative for chills, diaphoresis and fever.  HENT:  Negative for nosebleeds and sore throat.   Eyes:  Negative for double vision.  Respiratory:  Negative for cough, hemoptysis, sputum production and wheezing.   Cardiovascular:  Negative for chest pain, palpitations, orthopnea and leg swelling.  Gastrointestinal:  Positive for constipation. Negative for abdominal pain, blood in stool, diarrhea, heartburn, melena, nausea and vomiting.  Genitourinary:  Positive for hematuria. Negative for dysuria, frequency and urgency.  Musculoskeletal:  Positive for back pain and joint pain.  Skin: Negative.  Negative for itching and rash.  Neurological:  Negative for dizziness, tingling, focal weakness, weakness and headaches.  Endo/Heme/Allergies:  Does not bruise/bleed easily.  Psychiatric/Behavioral:  Negative for depression. The patient is not nervous/anxious and does not have insomnia.      MEDICAL HISTORY:  Past Medical History:  Diagnosis Date  . A-fib ([place])   . Anemia   . Arthritis    BACK  . Bladder cancer ([place])   . BPH (  benign prostatic hyperplasia)   . Cancer ([place])    skin cancer on face carcinoma  .  ED (erectile dysfunction)   . Gilbert disease   . HLD (hyperlipidemia)   . Hypertension   . Macular degeneration   . Mollusca contagiosa   . Osteopenia   . Presence of permanent cardiac pacemaker   . Sensorineural hearing loss   . Squamous cell carcinoma of skin 08/25/2019   R cheek   . Squamous cell carcinoma of skin 02/12/2020   L neck   . Squamous cell carcinoma of skin 11/30/2020   Right elbow, [facility]  . Squamous cell carcinoma of skin 03/16/2021   right base of thumb [facility]  . Squamous cell carcinoma of skin 03/16/2021   right preauricular - [facility] 11/17/21  . Squamous cell carcinoma of skin 10/05/2021   Right dorsum hand - [facility]  . Squamous cell carcinoma of skin 11/22/2021   right inf preauricular anterior, [facility]  . Squamous cell carcinoma of skin 11/22/2021   right inf preauricular posterior, [facility]    SURGICAL HISTORY: Past Surgical History:  Procedure Laterality Date  . CATARACT EXTRACTION Bilateral   . CYSTOSCOPY WITH BIOPSY N/A 01/20/2020   Procedure: CYSTOSCOPY WITH BIOPSY;  Surgeon: Abbie Sons, MD;  Location: [facility] ORS;  Service: Urology;  Laterality: N/A;  . CYSTOSCOPY WITH FULGERATION N/A 01/20/2020   Procedure: CYSTOSCOPY WITH FULGERATION;  Surgeon: Abbie Sons, MD;  Location: [facility];  Service: Urology;  Laterality: N/A;  . CYSTOSCOPY WITH FULGERATION N/A 04/19/2020   Procedure: [place] with clot evacuation;  Surgeon: Abbie Sons, MD;  Location: [facility];  Service: Urology;  Laterality: N/A;  . CYSTOSCOPY WITH FULGERATION N/A 01/05/2021   Procedure: CYSTOSCOPY WITH FULGERATION;  Surgeon: Abbie Sons, MD;  Location: [facility];  Service: Urology;  Laterality: N/A;  . HEMORRHOIDECTOMY WITH HEMORRHOID BANDING    . KNEE ARTHROSCOPY Left   . PACEMAKER INSERTION N/A 03/03/2015   Procedure: INSERTION DUAL LEAD PACEMAKER;  Surgeon: Isaias Cowman, MD;  Location: [facility];  Service: Cardiovascular;  Laterality: N/A;  . PULSE GENERATOR  IMPLANT N/A 10/18/2020   Procedure: UNILATERAL PULSE GENERATOR IMPLANT;  Surgeon: [name], MD;  Location: [facility];  Service: Neurosurgery;  Laterality: N/A;  . SPINAL CORD STIMULATOR TRIAL N/A 10/11/2020   Procedure: PERCUTANEOUS SPINAL CORD STIMULATOR TRIAL;  Surgeon: [name], MD;  Location: [facility];  Service: Neurosurgery;  Laterality: N/A;  . THORACIC LAMINECTOMY FOR SPINAL CORD STIMULATOR N/A 10/18/2020   Procedure: PERCUTANEOUS LEAD PLACEMENT;  Surgeon: [name], MD;  Location: [facility];  Service: Neurosurgery;  Laterality: N/A;  . TONSILLECTOMY    . TRANSURETHRAL RESECTION OF BLADDER TUMOR N/A 10/07/2019   Procedure: TRANSURETHRAL RESECTION OF BLADDER TUMOR (TURBT);  Surgeon: Abbie Sons, MD;  Location: [facility];  Service: Urology;  Laterality: N/A;  . TRANSURETHRAL RESECTION OF BLADDER TUMOR N/A 04/13/2020   Procedure: TRANSURETHRAL RESECTION OF BLADDER TUMOR (TURBT);  Surgeon: Abbie Sons, MD;  Location: [facility];  Service: Urology;  Laterality: N/A;    SOCIAL HISTORY: Social History   Socioeconomic History  . Marital status: Widowed    Spouse name: Not on file  . Number of children: Not on file  . Years of education: Not on file  . Highest education level: Not on file  Occupational History  . Not on file  Tobacco Use  . Smoking status: Never  . Smokeless tobacco: Never  Vaping Use  . Vaping Use: Never  used  Substance and Sexual Activity  . Alcohol use: Yes    Alcohol/week: 7.0 standard drinks of alcohol    Types: 7 Glasses of wine per week    Comment: DAILY   . Drug use: No  . Sexual activity: Yes    Birth control/protection: None  Other Topics Concern  . Not on file  Social History Narrative   Retd. Civil engineer, contracting; [place]; self. Never smoked; alcohol- glass wine/drink every night.     Social Determinants of Health   Financial Resource Strain: Not on file  Food Insecurity: Not on file  Transportation Needs: Not on file  Physical  Activity: Not on file  Stress: Not on file  Social Connections: Not on file  Intimate Partner Violence: Not on file    FAMILY HISTORY: Family History  Problem Relation Age of Onset  . Diabetes Mother   . Lung cancer Father   . Cancer Paternal Uncle   . Prostate cancer Neg Hx   . Bladder Cancer Neg Hx   . Kidney cancer Neg Hx     ALLERGIES:  is allergic to atenolol.  MEDICATIONS:  Current Outpatient Medications  Medication Sig Dispense Refill  . atorvastatin (LIPITOR) 10 MG tablet Take 10 mg by mouth in the morning.    . calcium carbonate (OSCAL) 1500 (600 Ca) MG TABS tablet Take 600 mg of elemental calcium by mouth in the morning.    . Cholecalciferol (VITAMIN D3) 50 MCG (2000 UT) TABS Take 2,000 Units by mouth in the morning.    Marland Kitchen ELIQUIS 5 MG TABS tablet Take 1 tablet by mouth daily.    . ferrous sulfate 325 (65 FE) MG tablet Take 325 mg by mouth in the morning and at bedtime.    . finasteride (PROSCAR) 5 MG tablet TAKE 1 TABLET DAILY (Patient taking differently: Take 5 mg by mouth daily.) 90 tablet 3  . HYDROcodone-acetaminophen (NORCO/VICODIN) 5-325 MG tablet Take 1 tablet by mouth every 8 (eight) hours as needed for moderate pain. 65 tablet 0  . metoprolol tartrate (LOPRESSOR) 50 MG tablet Take 1 tablet (50 mg total) by mouth 2 (two) times daily. (Patient taking differently: Take 50 mg by mouth 2 (two) times daily. 2 in the morning and 2 qhs) 60 tablet 0  . mirtazapine (REMERON) 15 MG tablet Take 15 mg by mouth at bedtime.    Marland Kitchen morphine (MS CONTIN) 30 MG 12 hr tablet Take 1 tablet (30 mg total) by mouth every 12 (twelve) hours. 60 tablet 0  . Multiple Vitamin (MULTIVITAMIN WITH MINERALS) TABS tablet Take 1 tablet by mouth in the morning.    . Multiple Vitamins-Minerals (PRESERVISION AREDS 2 PO) Take 1 tablet by mouth in the morning and at bedtime.    . Omega-3 Fatty Acids (FISH OIL) 1000 MG CAPS Take 1,000 mg by mouth daily.    . tamsulosin (FLOMAX) 0.4 MG CAPS capsule Take  0.4 mg by mouth daily.    Marland Kitchen diltiazem (CARDIZEM CD) 240 MG 24 hr capsule Take 1 capsule (240 mg total) by mouth daily. (Patient not taking: Reported on 04/27/2022) 30 capsule 0  . traZODone (DESYREL) 50 MG tablet Take 50 mg by mouth at bedtime. (Patient not taking: Reported on 04/27/2022)     No current facility-administered medications for this visit.      Marland Kitchen  PHYSICAL EXAMINATION: ECOG PERFORMANCE STATUS: 0 - Asymptomatic  Vitals:   04/27/22 0922  BP: 123/65  Pulse: 72  Resp: 16  Temp: (!) 97.5 F (  36.4 C)  SpO2: 100%     Filed Weights   04/27/22 0922  Weight: 169 lb 3.2 oz (76.7 kg)     Frail-appearing Caucasian male patient.   Physical Exam HENT:     Head: Normocephalic and atraumatic.     Mouth/Throat:     Pharynx: No oropharyngeal exudate.  Eyes:     Pupils: Pupils are equal, round, and reactive to light.  Cardiovascular:     Rate and Rhythm: Normal rate and regular rhythm.  Pulmonary:     Effort: No respiratory distress.     Breath sounds: No wheezing.  Abdominal:     General: Bowel sounds are normal. There is no distension.     Palpations: Abdomen is soft. There is no mass.     Tenderness: There is no abdominal tenderness. There is no guarding or rebound.  Musculoskeletal:        General: No tenderness. Normal range of motion.     Cervical back: Normal range of motion and neck supple.  Skin:    General: Skin is warm.  Neurological:     Mental Status: He is alert and oriented to person, place, and time.  Psychiatric:        Mood and Affect: Affect normal.    LABORATORY DATA:  I have reviewed the data as listed Lab Results  Component Value Date   WBC 5.6 04/27/2022   HGB 10.7 (L) 04/27/2022   HCT 34.0 (L) 04/27/2022   MCV 100.0 04/27/2022   PLT 325 04/27/2022   Recent Labs    03/16/22 0934 04/06/22 0852 04/27/22 0835  NA 138 138 138  K 4.7 4.3 4.7  CL 102 103 99  CO2 31 32 30  GLUCOSE 112* 105* 111*  BUN '21 14 19  '$ CREATININE 1.07  0.98 0.94  CALCIUM 9.1 9.2 9.4  GFRNONAA >60 >60 >60  PROT 6.3* 6.7 7.2  ALBUMIN 3.1* 3.0* 3.1*  AST '21 20 30  '$ ALT '29 15 20  '$ ALKPHOS 83 90 99  BILITOT 1.0 0.7 0.6    RADIOGRAPHIC STUDIES: I have personally reviewed the radiological images as listed and agreed with the findings in the report. No results found.  ASSESSMENT & PLAN:   Cancer of overlapping sites of bladder [facility])  JUNE 2022- CT [facility]- Multiple recurrent high-grade-suspicious for muscle invasive urothelial malignancy; possible extravascular extension of the tumor [biopsy-[facility] negative for muscle invasion].  S/p  radiation at [facility].  S/p cystoscopy- NEG. AUG 11th, 2023- PET scan- Hypermetabolic metastatic osseous lesions, left supraclavicular lymph nodes, right upper lobe nodule and abdominal retroperitoneal adenopathy-highly concerning for malignancy.  Suprclaviulcar LN- biopsy- positive for malignancy; QNS for IHC. Currently on keytruda on palliative basis.   # Proceed with Keytruda #3.  Labs today reviewed;  acceptable for treatment today. We will plan to have imaging after 4 cycles. will order whole body PET at next visit.   # Lumbar back pain-likely secondary to metastatic lesions-currently s/p radiation oncology for palliative radiation [sep 1st 2023].  Continue hydrocodone q 8 hours prn.  Right arm pain-sec to metastatic disease s/p RT [10/16; 10/17].- added MS contin 30 mg BID- new script sent. Consider zometa.   # Iron deficiency anemia secondary to chronic blood loss/hematuria [see below]- S/p  IV Venofer; Today Hb-10.9 ;SEP 2023- Iron sat-18; ferritin-41;  Discussed the potential acute infusion reactions with IV iron; which are quite rare.  Patient understands the risk; will proceed with infusions.  # Jerking movement random- check mag; low  clinical suspicin for seizures.   # IV access: PIV for now.   # DISPOSITION:  # Keytruda today; venofer today; NO IVFs today # follow up in 3 weeks/Friday- MD; labs-  cbc/cmp;magnesium; Beryle Flock-; possible IVFs Over 1 hour/ /venofer; possible Zometa . -Dr.B           All questions were answered. The patient knows to call the clinic with any problems, questions or concerns.    Cammie Sickle, MD 04/27/2022 12:24 PM

## 2022-04-27 NOTE — Assessment & Plan Note (Addendum)
JUNE 2022- CT [facility]- Multiple recurrent high-grade-suspicious for muscle invasive urothelial malignancy; possible extravascular extension of the tumor [biopsy-[facility] negative for muscle invasion].  S/p  radiation at [facility].  S/p cystoscopy- NEG. AUG 11th, 2023- PET scan- Hypermetabolic metastatic osseous lesions, left supraclavicular lymph nodes, right upper lobe nodule and abdominal retroperitoneal adenopathy-highly concerning for malignancy.  Suprclaviulcar LN- biopsy- positive for malignancy; QNS for IHC. Currently on keytruda on palliative basis.   # Proceed with Keytruda #3.  Labs today reviewed;  acceptable for treatment today. We will plan to have imaging after 4 cycles. will order whole body PET at next visit.   # Lumbar back pain-likely secondary to metastatic lesions-currently s/p radiation oncology for palliative radiation [sep 1st 2023].  Continue hydrocodone q 8 hours prn.  Right arm pain-sec to metastatic disease s/p RT [10/16; 10/17].- added MS contin 30 mg BID- new script sent. Consider zometa.   # Iron deficiency anemia secondary to chronic blood loss/hematuria [see below]- S/p  IV Venofer; Today Hb-10.9 ;SEP 2023- Iron sat-18; ferritin-41;  Discussed the potential acute infusion reactions with IV iron; which are quite rare.  Patient understands the risk; will proceed with infusions.  # Jerking movement random- check mag; low clinical suspicin for seizures.   # IV access: PIV for now.   # DISPOSITION:  # Keytruda today; venofer today; NO IVFs today # follow up in 3 weeks/Friday- MD; labs- cbc/cmp;magnesium; Beryle Flock-; possible IVFs Over 1 hour/ /venofer; possible Zometa . -Dr.B

## 2022-04-27 NOTE — Progress Notes (Signed)
Pt states at times he experiences a jerking sensation in his joints. Also, daughter will like to discuss an increase in dosage for [facility] or switching over to a new pain medication.

## 2022-04-27 NOTE — Patient Instructions (Signed)
[facility] [place]  Discharge Instructions: Thank you for choosing [facility] to provide your oncology and hematology care.  If you have a lab appointment with the Cancer Center, please go directly to the Cancer Center and check in at the registration area.  Wear comfortable clothing and clothing appropriate for easy access to any Portacath or PICC line.   We strive to give you quality time with your provider. You may need to reschedule your appointment if you arrive late (15 or more minutes).  Arriving late affects you and other patients whose appointments are after yours.  Also, if you miss three or more appointments without notifying the office, you may be dismissed from the clinic at the provider's discretion.      For prescription refill requests, have your pharmacy contact our office and allow 72 hours for refills to be completed.    Today you received the following chemotherapy and/or immunotherapy agents Keytruda      To help prevent nausea and vomiting after your treatment, we encourage you to take your nausea medication as directed.  BELOW ARE SYMPTOMS THAT SHOULD BE REPORTED IMMEDIATELY: *FEVER GREATER THAN 100.4 F (38 C) OR HIGHER *CHILLS OR SWEATING *NAUSEA AND VOMITING THAT IS NOT CONTROLLED WITH YOUR NAUSEA MEDICATION *UNUSUAL SHORTNESS OF BREATH *UNUSUAL BRUISING OR BLEEDING *URINARY PROBLEMS (pain or burning when urinating, or frequent urination) *BOWEL PROBLEMS (unusual diarrhea, constipation, pain near the anus) TENDERNESS IN MOUTH AND THROAT WITH OR WITHOUT PRESENCE OF ULCERS (sore throat, sores in mouth, or a toothache) UNUSUAL RASH, SWELLING OR PAIN  UNUSUAL VAGINAL DISCHARGE OR ITCHING   Items with * indicate a potential emergency and should be followed up as soon as possible or go to the Emergency Department if any problems should occur.  Please show the CHEMOTHERAPY ALERT CARD or IMMUNOTHERAPY ALERT CARD at check-in to  the Emergency Department and triage nurse.  Should you have questions after your visit or need to cancel or reschedule your appointment, please contact [facility] [place]  [phone] and follow the prompts.  Office hours are 8:00 a.m. to 4:30 p.m. Monday - Friday. Please note that voicemails left after 4:00 p.m. may not be returned until the following business day.  We are closed weekends and major holidays. You have access to a nurse at all times for urgent questions. Please call the main number to the clinic [phone] and follow the prompts.  For any non-urgent questions, you may also contact your provider using MyChart. We now offer e-Visits for anyone 18 and older to request care online for non-urgent symptoms. For details visit [email or web address].   Also download the MyChart app! Go to the app store, search "MyChart", open the app, select [facility], and log in with your MyChart username and password.  Masks are optional in the cancer centers. If you would like for your care team to wear a mask while they are taking care of you, please let them know. For doctor visits, patients may have with them one support person who is at least 86 years old. At this time, visitors are not allowed in the infusion area.   

## 2022-04-28 ENCOUNTER — Other Ambulatory Visit: Payer: Self-pay

## 2022-04-28 ENCOUNTER — Encounter: Payer: Self-pay | Admitting: Internal Medicine

## 2022-04-28 ENCOUNTER — Ambulatory Visit
Admission: RE | Admit: 2022-04-28 | Discharge: 2022-04-28 | Disposition: A | Discharge status: Home / Self Care | Payer: Medicare HMO | Source: Ambulatory Visit | Attending: Radiation Oncology | Admitting: Radiation Oncology

## 2022-04-28 DIAGNOSIS — Z510 Encounter for antineoplastic radiation therapy: Secondary | ICD-10-CM | POA: Diagnosis not present

## 2022-04-28 LAB — RAD ONC ARIA SESSION SUMMARY
Course Elapsed Days: 9
Plan Fractions Treated to Date: 5
Plan Prescribed Dose Per Fraction: 3 Gy
Plan Total Fractions Prescribed: 10
Plan Total Prescribed Dose: 30 Gy
Reference Point Dosage Given to Date: 15 Gy
Reference Point Session Dosage Given: 3 Gy
Session Number: 6

## 2022-04-28 LAB — T4: T4, Total: 8.3 ug/dL (ref 4.5–12.0)

## 2022-05-01 ENCOUNTER — Other Ambulatory Visit: Payer: Self-pay | Admitting: Internal Medicine

## 2022-05-01 ENCOUNTER — Inpatient Hospital Stay (HOSPITAL_BASED_OUTPATIENT_CLINIC_OR_DEPARTMENT_OTHER): Payer: Medicare HMO | Admitting: Hospice and Palliative Medicine

## 2022-05-01 ENCOUNTER — Encounter: Payer: Self-pay | Admitting: Internal Medicine

## 2022-05-01 ENCOUNTER — Ambulatory Visit
Admission: RE | Admit: 2022-05-01 | Discharge: 2022-05-01 | Disposition: A | Discharge status: Home / Self Care | Payer: Medicare HMO | Source: Ambulatory Visit | Attending: Radiation Oncology | Admitting: Radiation Oncology

## 2022-05-01 ENCOUNTER — Other Ambulatory Visit: Payer: Self-pay

## 2022-05-01 VITALS — BP 127/70 | HR 73 | Temp 98.3°F

## 2022-05-01 DIAGNOSIS — C678 Malignant neoplasm of overlapping sites of bladder: Secondary | ICD-10-CM

## 2022-05-01 DIAGNOSIS — Z510 Encounter for antineoplastic radiation therapy: Secondary | ICD-10-CM | POA: Diagnosis not present

## 2022-05-01 DIAGNOSIS — Z5112 Encounter for antineoplastic immunotherapy: Secondary | ICD-10-CM | POA: Diagnosis not present

## 2022-05-01 DIAGNOSIS — G893 Neoplasm related pain (acute) (chronic): Secondary | ICD-10-CM

## 2022-05-01 DIAGNOSIS — R630 Anorexia: Secondary | ICD-10-CM

## 2022-05-01 LAB — RAD ONC ARIA SESSION SUMMARY
Course Elapsed Days: 12
Plan Fractions Treated to Date: 6
Plan Prescribed Dose Per Fraction: 3 Gy
Plan Total Fractions Prescribed: 10
Plan Total Prescribed Dose: 30 Gy
Reference Point Dosage Given to Date: 18 Gy
Reference Point Session Dosage Given: 3 Gy
Session Number: 7

## 2022-05-01 MED ORDER — FENTANYL 12 MCG/HR TD PT72: 1.0000 | MEDICATED_PATCH | TRANSDERMAL | 0 refills | Status: DC

## 2022-05-01 MED ORDER — MIRTAZAPINE 30 MG PO TABS: 30.0000 mg | ORAL_TABLET | Freq: Every day | ORAL | 3 refills | Status: DC

## 2022-05-01 MED ORDER — NALOXONE HCL 4 MG/0.1ML NA LIQD: NASAL | 0 refills | Status: DC

## 2022-05-01 MED ORDER — HYDROCODONE-ACETAMINOPHEN 5-325 MG PO TABS: 1.0000 | ORAL_TABLET | ORAL | 0 refills | Status: DC | PRN

## 2022-05-01 NOTE — Progress Notes (Signed)
Symptom Management [place] at [facility] Telephone:([phone]) [phone] [phone]  Patient Care Team: [name], MD as PCP - General (Infectious Diseases) Cammie Sickle, MD as Consulting Physician (Oncology) Jason Coop, NP as Nurse Practitioner (Hospice and Palliative Medicine) Nguyen Todorov, Kirt Boys, NP as Nurse Practitioner [facility] and Palliative Medicine)   NAME OF PATIENT: Christopher [name]  [id number]  1927/11/25   DATE OF VISIT: 05/01/22  REASON FOR CONSULT: Christopher [name] is a 88 y.o. male with multiple medical problems including history of peritoneal/omental thickening of unclear etiology also with recurrent high-grade bladder cancer status post XRT at [facility].  PET scan February 17, 2022 revealed hypermetabolic metastatic osseous lesions, left supraclavicular lymph node involvement, right upper lobe, and hypermetabolic abdominal retroperitoneal adenopathy.  Patient was not felt to be a poor candidate for systemic chemotherapy given his age. He was started on immunotherapy instead.  He had persistent lumbar back pain likely secondary to metastatic lesions.  Patient is on XRT.  INTERVAL HISTORY: Daughter requested [facility] follow-up to discuss mirtazapine dosage.  Patient was started on mirtazapine as an appetite stimulant and patient has had some weight gain but daughter feels that patient's appetite is poor.  Additionally, patient has had nausea and vomiting on MS Contin with poor pain control and family are requesting adjustment to his pain regimen.  Denies any neurologic complaints. Denies recent fevers or illnesses. Denies any easy bleeding or bruising.  Denies chest pain. Denies any nausea, vomiting, area.  Has had constipation.  Denies urinary complaints. Patient offers no further specific complaints today.  PAST MEDICAL HISTORY: Past Medical History:  Diagnosis Date   A-fib ([place])    Anemia    Arthritis    BACK    Bladder cancer (HCC)    BPH (benign prostatic hyperplasia)    Cancer (HCC)    skin cancer on face carcinoma   ED (erectile dysfunction)    Rosanna Randy disease    HLD (hyperlipidemia)    Hypertension    Macular degeneration    Mollusca contagiosa    Osteopenia    Presence of permanent cardiac pacemaker    Sensorineural hearing loss    Squamous cell carcinoma of skin 08/25/2019   R cheek    Squamous cell carcinoma of skin 02/12/2020   L neck    Squamous cell carcinoma of skin 11/30/2020   Right elbow, [facility]   Squamous cell carcinoma of skin 03/16/2021   right base of thumb [facility]   Squamous cell carcinoma of skin 03/16/2021   right preauricular - [facility] 11/17/21   Squamous cell carcinoma of skin 10/05/2021   Right dorsum hand - [facility]   Squamous cell carcinoma of skin 11/22/2021   right inf preauricular anterior, [facility]   Squamous cell carcinoma of skin 11/22/2021   right inf preauricular posterior, [facility]    PAST SURGICAL HISTORY:  Past Surgical History:  Procedure Laterality Date   CATARACT EXTRACTION Bilateral    CYSTOSCOPY WITH BIOPSY N/A 01/20/2020   Procedure: CYSTOSCOPY WITH BIOPSY;  Surgeon: Abbie Sons, MD;  Location: [facility] ORS;  Service: Urology;  Laterality: N/A;   CYSTOSCOPY WITH FULGERATION N/A 01/20/2020   Procedure: CYSTOSCOPY WITH FULGERATION;  Surgeon: Abbie Sons, MD;  Location: [facility] ORS;  Service: Urology;  Laterality: N/A;   CYSTOSCOPY WITH FULGERATION N/A 04/19/2020   Procedure: [place] with clot evacuation;  Surgeon: Abbie Sons, MD;  Location: [facility] ORS;  Service: Urology;  Laterality: N/A;  CYSTOSCOPY WITH FULGERATION N/A 01/05/2021   Procedure: CYSTOSCOPY WITH FULGERATION;  Surgeon: Abbie Sons, MD;  Location: [facility] ORS;  Service: Urology;  Laterality: N/A;   HEMORRHOIDECTOMY WITH HEMORRHOID BANDING     KNEE ARTHROSCOPY Left    PACEMAKER INSERTION N/A 03/03/2015   Procedure: INSERTION DUAL LEAD PACEMAKER;  Surgeon: Isaias Cowman, MD;  Location: [facility] ORS;  Service: Cardiovascular;  Laterality: N/A;   PULSE GENERATOR IMPLANT N/A 10/18/2020   Procedure: UNILATERAL PULSE GENERATOR IMPLANT;  Surgeon: [name], MD;  Location: [facility] ORS;  Service: Neurosurgery;  Laterality: N/A;   SPINAL CORD STIMULATOR TRIAL N/A 10/11/2020   Procedure: PERCUTANEOUS SPINAL CORD STIMULATOR TRIAL;  Surgeon: [name], MD;  Location: [facility] ORS;  Service: Neurosurgery;  Laterality: N/A;   THORACIC LAMINECTOMY FOR SPINAL CORD STIMULATOR N/A 10/18/2020   Procedure: PERCUTANEOUS LEAD PLACEMENT;  Surgeon: [name], MD;  Location: [facility] ORS;  Service: Neurosurgery;  Laterality: N/A;   TONSILLECTOMY     TRANSURETHRAL RESECTION OF BLADDER TUMOR N/A 10/07/2019   Procedure: TRANSURETHRAL RESECTION OF BLADDER TUMOR (TURBT);  Surgeon: Abbie Sons, MD;  Location: [facility] ORS;  Service: Urology;  Laterality: N/A;   TRANSURETHRAL RESECTION OF BLADDER TUMOR N/A 04/13/2020   Procedure: TRANSURETHRAL RESECTION OF BLADDER TUMOR (TURBT);  Surgeon: Abbie Sons, MD;  Location: [facility] ORS;  Service: Urology;  Laterality: N/A;    HEMATOLOGY/ONCOLOGY HISTORY:  Oncology History Overview Note  # NOV 2020- [incidental/hematuria work-up]-subtle peritoneal thickening/plaque-like lesions; March 2021-CT scan progressive omental/peritoneal thickening  #Microscopic hematuria- sec to BPH [cystoscopy/CT urogram negative; Dr.Stoiff]; March 2021 CT scan-new lesion in the bladder diverticulum  # MAY-June 2021- severe IDA sec to hemauria [hb 8.5; Ferritin- ]  #July 2022-hemorrhagic shock/bladder cancer-s/p radiation [facility]; no chemotherapy.[Dr.Neilsen]  # AUG 11th, 2023- PET scan- Hypermetabolic metastatic osseous lesions, left supraclavicular lymph nodes, right upper lobe nodule and abdominal retroperitoneal adenopathy- BIOPSY- POSITIVE FOR MALIGNANCY; QNS for IHC. BACK RT s/p March 10, 2022 '[]'$   # SEP 7th, 2023- Keytruda single agent.   # on eliquis ?  A.fib/pacemaker;   Urothelial carcinoma of bladder ([place])  10/29/2019 Initial Diagnosis   Urothelial carcinoma of bladder ([place])   Cancer of overlapping sites of bladder ([place])  02/17/2022 Initial Diagnosis   Cancer of overlapping sites of bladder ([place])   02/17/2022 Cancer Staging   Staging form: Urinary Bladder, AJCC 8th Edition - Clinical: Stage Unknown (cTX, cN2, cM1) - Signed by Cammie Sickle, MD on 02/17/2022   03/16/2022 -  Chemotherapy   Patient is on Treatment Plan : BLADDER Pembrolizumab (200) q21d       ALLERGIES:  is allergic to atenolol.  MEDICATIONS:  Current Outpatient Medications  Medication Sig Dispense Refill   atorvastatin (LIPITOR) 10 MG tablet Take 10 mg by mouth in the morning.     calcium carbonate (OSCAL) 1500 (600 Ca) MG TABS tablet Take 600 mg of elemental calcium by mouth in the morning.     Cholecalciferol (VITAMIN D3) 50 MCG (2000 UT) TABS Take 2,000 Units by mouth in the morning.     diltiazem (CARDIZEM CD) 240 MG 24 hr capsule Take 1 capsule (240 mg total) by mouth daily. (Patient not taking: Reported on 04/27/2022) 30 capsule 0   ELIQUIS 5 MG TABS tablet Take 1 tablet by mouth daily.     ferrous sulfate 325 (65 FE) MG tablet Take 325 mg by mouth in the morning and at bedtime.     finasteride (PROSCAR) 5 MG tablet TAKE 1 TABLET  DAILY (Patient taking differently: Take 5 mg by mouth daily.) 90 tablet 3   HYDROcodone-acetaminophen (NORCO/VICODIN) 5-325 MG tablet Take 1 tablet by mouth every 8 (eight) hours as needed for moderate pain. 65 tablet 0   metoprolol tartrate (LOPRESSOR) 50 MG tablet Take 1 tablet (50 mg total) by mouth 2 (two) times daily. (Patient taking differently: Take 50 mg by mouth 2 (two) times daily. 2 in the morning and 2 qhs) 60 tablet 0   mirtazapine (REMERON) 15 MG tablet Take 15 mg by mouth at bedtime.     morphine (MS CONTIN) 30 MG 12 hr tablet Take 1 tablet (30 mg total) by mouth every 12 (twelve) hours. 60 tablet 0   Multiple  Vitamin (MULTIVITAMIN WITH MINERALS) TABS tablet Take 1 tablet by mouth in the morning.     Multiple Vitamins-Minerals (PRESERVISION AREDS 2 PO) Take 1 tablet by mouth in the morning and at bedtime.     Omega-3 Fatty Acids (FISH OIL) 1000 MG CAPS Take 1,000 mg by mouth daily.     tamsulosin (FLOMAX) 0.4 MG CAPS capsule Take 0.4 mg by mouth daily.     traZODone (DESYREL) 50 MG tablet Take 50 mg by mouth at bedtime. (Patient not taking: Reported on 04/27/2022)     No current facility-administered medications for this visit.    VITAL SIGNS: There were no vitals taken for this visit. There were no vitals filed for this visit.   Estimated body mass index is 23.6 kg/m as calculated from the following:   Height as of 04/11/22: '5\' 11"'$  (1.803 m).   Weight as of 04/27/22: 169 lb 3.2 oz (76.7 kg).  LABS: CBC:    Component Value Date/Time   WBC 5.6 04/27/2022 0835   HGB 10.7 (L) 04/27/2022 0835   HGB 13.2 01/13/2020 1412   HCT 34.0 (L) 04/27/2022 0835   HCT 39.5 01/13/2020 1412   PLT 325 04/27/2022 0835   PLT 222 01/13/2020 1412   MCV 100.0 04/27/2022 0835   MCV 90 01/13/2020 1412   NEUTROABS 4.2 04/27/2022 0835   NEUTROABS 3.6 01/13/2020 1412   LYMPHSABS 0.5 (L) 04/27/2022 0835   LYMPHSABS 1.1 01/13/2020 1412   MONOABS 0.7 04/27/2022 0835   EOSABS 0.2 04/27/2022 0835   EOSABS 0.1 01/13/2020 1412   BASOSABS 0.0 04/27/2022 0835   BASOSABS 0.0 01/13/2020 1412   Comprehensive Metabolic Panel:    Component Value Date/Time   NA 138 04/27/2022 0835   NA 141 01/13/2020 1412   K 4.7 04/27/2022 0835   K 4.4 08/10/2011 1443   CL 99 04/27/2022 0835   CO2 30 04/27/2022 0835   BUN 19 04/27/2022 0835   BUN 18 01/13/2020 1412   CREATININE 0.94 04/27/2022 0835   GLUCOSE 111 (H) 04/27/2022 0835   CALCIUM 9.4 04/27/2022 0835   AST 30 04/27/2022 0835   ALT 20 04/27/2022 0835   ALKPHOS 99 04/27/2022 0835   BILITOT 0.6 04/27/2022 0835   PROT 7.2 04/27/2022 0835   ALBUMIN 3.1 (L) 04/27/2022  0835    RADIOGRAPHIC STUDIES: No results found.  PERFORMANCE STATUS (ECOG) : 3 - Symptomatic, >50% confined to bed  Review of Systems Unless otherwise noted, a complete review of systems is negative.  Physical Exam General: NAD Cardiovascular: regular rate and rhythm Pulmonary: clear ant fields Abdomen: soft, nontender, + bowel sounds GU: no suprapubic tenderness Extremities: no edema, no joint deformities Skin: no rashes Neurological: Weakness but otherwise nonfocal  IMPRESSION/PLAN: Poor oral intake-likely secondary to stage IV  bladder cancer +/- treatment effect.  Patient has had some weight gain on mirtazapine.  Daughter is interested in increasing the dose of mirtazapine.  I explained to her that weight gain is not highly correlated with dosing. However, patient seems to be tolerating mirtazapine well.  Okay to increase to 30 mg nightly.  He is followed by nutrition.  Recommend oral nutritional supplements 2-3 times daily and focusing on high-calorie/high-protein foods.  Neoplasm related pain-patient is on XRT.  Reportedly intolerant of MS Contin, which caused nausea vomiting.  Okay to discontinue MS Contin and will start low-dose transdermal fentanyl instead at 12.3mg Q72H.  Will also liberalize Norco 1 to 2 tablets every 4 hours as needed for breakthrough pain.  Discussed importance of daily bowel regimen with MiraLAX +/- senna.  PDMP reviewed and naloxone prescribed.  Follow-up telephone visit with me in 1 to 2 weeks. RTC 2 weeks to see Dr. BRogue Bussing    Patient expressed understanding and was in agreement with this plan. He also understands that He can call clinic at any time with any questions, concerns, or complaints.   Thank you for allowing me to participate in the care of this very pleasant patient.   Time Total: 15 minutes  Visit consisted of counseling and education dealing with the complex and emotionally intense issues of symptom management in the setting of  serious illness.Greater than 50%  of this time was spent counseling and coordinating care related to the above assessment and plan.  Signed by: JAltha Harm PhD, NP-C

## 2022-05-02 ENCOUNTER — Other Ambulatory Visit: Payer: Self-pay

## 2022-05-02 ENCOUNTER — Ambulatory Visit
Admission: RE | Admit: 2022-05-02 | Discharge: 2022-05-02 | Disposition: A | Discharge status: Home / Self Care | Payer: Medicare HMO | Source: Ambulatory Visit | Attending: Radiation Oncology | Admitting: Radiation Oncology

## 2022-05-02 DIAGNOSIS — Z510 Encounter for antineoplastic radiation therapy: Secondary | ICD-10-CM | POA: Diagnosis not present

## 2022-05-02 LAB — RAD ONC ARIA SESSION SUMMARY
Course Elapsed Days: 13
Plan Fractions Treated to Date: 7
Plan Prescribed Dose Per Fraction: 3 Gy
Plan Total Fractions Prescribed: 10
Plan Total Prescribed Dose: 30 Gy
Reference Point Dosage Given to Date: 21 Gy
Reference Point Session Dosage Given: 3 Gy
Session Number: 8

## 2022-05-03 ENCOUNTER — Ambulatory Visit
Admission: RE | Admit: 2022-05-03 | Discharge: 2022-05-03 | Disposition: A | Discharge status: Home / Self Care | Payer: Medicare HMO | Source: Ambulatory Visit | Attending: Radiation Oncology | Admitting: Radiation Oncology

## 2022-05-03 ENCOUNTER — Other Ambulatory Visit: Payer: Self-pay

## 2022-05-03 DIAGNOSIS — Z510 Encounter for antineoplastic radiation therapy: Secondary | ICD-10-CM | POA: Diagnosis not present

## 2022-05-03 LAB — RAD ONC ARIA SESSION SUMMARY
Course Elapsed Days: 14
Plan Fractions Treated to Date: 8
Plan Prescribed Dose Per Fraction: 3 Gy
Plan Total Fractions Prescribed: 10
Plan Total Prescribed Dose: 30 Gy
Reference Point Dosage Given to Date: 24 Gy
Reference Point Session Dosage Given: 3 Gy
Session Number: 9

## 2022-05-04 ENCOUNTER — Ambulatory Visit
Admission: RE | Admit: 2022-05-04 | Discharge: 2022-05-04 | Disposition: A | Discharge status: Home / Self Care | Payer: Medicare HMO | Source: Ambulatory Visit | Attending: Radiation Oncology | Admitting: Radiation Oncology

## 2022-05-04 ENCOUNTER — Other Ambulatory Visit: Payer: Self-pay

## 2022-05-04 ENCOUNTER — Ambulatory Visit: Payer: Medicare HMO | Admitting: Dermatology

## 2022-05-04 DIAGNOSIS — Z510 Encounter for antineoplastic radiation therapy: Secondary | ICD-10-CM | POA: Diagnosis not present

## 2022-05-04 LAB — RAD ONC ARIA SESSION SUMMARY
Course Elapsed Days: 15
Plan Fractions Treated to Date: 9
Plan Prescribed Dose Per Fraction: 3 Gy
Plan Total Fractions Prescribed: 10
Plan Total Prescribed Dose: 30 Gy
Reference Point Dosage Given to Date: 27 Gy
Reference Point Session Dosage Given: 3 Gy
Session Number: 10

## 2022-05-05 ENCOUNTER — Ambulatory Visit
Admission: RE | Admit: 2022-05-05 | Discharge: 2022-05-05 | Disposition: A | Discharge status: Home / Self Care | Payer: Medicare HMO | Source: Ambulatory Visit | Attending: Radiation Oncology | Admitting: Radiation Oncology

## 2022-05-05 ENCOUNTER — Other Ambulatory Visit: Payer: Self-pay

## 2022-05-05 DIAGNOSIS — Z510 Encounter for antineoplastic radiation therapy: Secondary | ICD-10-CM | POA: Diagnosis not present

## 2022-05-05 LAB — RAD ONC ARIA SESSION SUMMARY
Course Elapsed Days: 16
Plan Fractions Treated to Date: 10
Plan Prescribed Dose Per Fraction: 3 Gy
Plan Total Fractions Prescribed: 10
Plan Total Prescribed Dose: 30 Gy
Reference Point Dosage Given to Date: 30 Gy
Reference Point Session Dosage Given: 3 Gy
Session Number: 11

## 2022-05-09 ENCOUNTER — Encounter: Payer: Self-pay | Admitting: Internal Medicine

## 2022-05-09 NOTE — Telephone Encounter (Signed)
Christopher B. Will address during 11/1 phone visit.

## 2022-05-10 ENCOUNTER — Inpatient Hospital Stay: Payer: Medicare HMO | Attending: Oncology | Admitting: Hospice and Palliative Medicine

## 2022-05-10 DIAGNOSIS — C7951 Secondary malignant neoplasm of bone: Secondary | ICD-10-CM | POA: Insufficient documentation

## 2022-05-10 DIAGNOSIS — C678 Malignant neoplasm of overlapping sites of bladder: Secondary | ICD-10-CM | POA: Insufficient documentation

## 2022-05-10 DIAGNOSIS — Z85828 Personal history of other malignant neoplasm of skin: Secondary | ICD-10-CM | POA: Insufficient documentation

## 2022-05-10 DIAGNOSIS — L089 Local infection of the skin and subcutaneous tissue, unspecified: Secondary | ICD-10-CM | POA: Insufficient documentation

## 2022-05-10 DIAGNOSIS — I959 Hypotension, unspecified: Secondary | ICD-10-CM | POA: Insufficient documentation

## 2022-05-10 DIAGNOSIS — G893 Neoplasm related pain (acute) (chronic): Secondary | ICD-10-CM | POA: Diagnosis not present

## 2022-05-10 DIAGNOSIS — Z923 Personal history of irradiation: Secondary | ICD-10-CM | POA: Insufficient documentation

## 2022-05-10 DIAGNOSIS — R630 Anorexia: Secondary | ICD-10-CM

## 2022-05-10 DIAGNOSIS — D72829 Elevated white blood cell count, unspecified: Secondary | ICD-10-CM | POA: Insufficient documentation

## 2022-05-10 DIAGNOSIS — Z801 Family history of malignant neoplasm of trachea, bronchus and lung: Secondary | ICD-10-CM | POA: Insufficient documentation

## 2022-05-10 DIAGNOSIS — I10 Essential (primary) hypertension: Secondary | ICD-10-CM | POA: Insufficient documentation

## 2022-05-10 NOTE — Addendum Note (Signed)
Addended by: Altha Harm R on: 05/10/2022 02:59 PM   Modules accepted: Orders

## 2022-05-10 NOTE — Progress Notes (Signed)
Virtual Visit via Telephone Note  I connected with [name] on 05/10/22 at  2:00 PM EDT by telephone and verified that I am speaking with the correct person using two identifiers.  Location: Patient: Home Provider: Clinic   I discussed the limitations, risks, security and privacy concerns of performing an evaluation and management service by telephone and the availability of in person appointments. I also discussed with the patient that there may be a patient responsible charge related to this service. The patient expressed understanding and agreed to proceed.   History of Present Illness: Christopher [name] is a 88 y.o. male with multiple medical problems including history of peritoneal/omental thickening of unclear etiology also with recurrent high-grade bladder cancer status post XRT at [facility].  PET scan February 17, 2022 revealed hypermetabolic metastatic osseous lesions, left supraclavicular lymph node involvement, right upper lobe, and hypermetabolic abdominal retroperitoneal adenopathy.   Patient was not felt to be a poor candidate for systemic chemotherapy given his age. He was started on immunotherapy instead.   He had persistent lumbar back pain likely secondary to metastatic lesions.  Patient is s/p XRT.   Observations/Objective: Had a long conversation with patient's 2 daughters Lattie Haw and Magda Paganini.  Lattie Haw is currently out of town but coming back on Friday.  Daughters report that patient has been declining over the past month but have noticed more rapid decline over the past week.  Patient's oral intake is minimal as he just does not have much interest at all when eating.  He also has been more drowsy over the past week and less mobile.  No fever or chills reported.  No dysuria or other symptoms.  Daughter reports that patient's pain was somewhat improved over the weekend after starting transdermal fentanyl.  However, pain has been worse over the past couple of days requiring  frequent dosing of Norco.  We had a long conversation about the possible etiology of his decline, which could be multifactorial due to poor oral intake, possible electrolyte derangements, progressive cancer, or related to medications.  I did recommend discontinuing mirtazapine and fentanyl as both of these can be sedating and timing of lethargy supports possible medication changes as being contributing factors.  Daughters can continue using Norco as needed for pain and we can make further adjustments to his pain regimen as needed.  We also discussed inpatient evaluation such as hospitalization or clinic eval.  Family are interested in bring patient in tomorrow for labs/[facility].  They would like a home health referral and requested that this referral be sent to [facility].  Patient is already followed by community palliative care.  Family confirmed DNR/DNI.  Assessment and Plan: Weakness -unclear etiology although could be multifactorial.  Recommend discontinuing fentanyl and mirtazapine and see if this improves patient's symptoms.  We will bring patient into clinic tomorrow for labs/fluids/SMC.  ED triggers reviewed.  Case and plan discussed with Dr. Rogue Bussing.  Can consider repeat PET scan.  Referral to home health.  Follow Up Instructions: RTC on tomorrow   I discussed the assessment and treatment plan with the patient. The patient was provided an opportunity to ask questions and all were answered. The patient agreed with the plan and demonstrated an understanding of the instructions.   The patient was advised to call back or seek an in-person evaluation if the symptoms worsen or if the condition fails to improve as anticipated.  I provided 35 minutes of non-face-to-face time during this encounter.   Irean Hong, NP

## 2022-05-11 ENCOUNTER — Inpatient Hospital Stay: Payer: Medicare HMO

## 2022-05-11 ENCOUNTER — Other Ambulatory Visit: Payer: Self-pay

## 2022-05-11 ENCOUNTER — Telehealth: Payer: Self-pay | Admitting: *Deleted

## 2022-05-11 ENCOUNTER — Inpatient Hospital Stay (HOSPITAL_BASED_OUTPATIENT_CLINIC_OR_DEPARTMENT_OTHER): Payer: Medicare HMO | Admitting: Hospice and Palliative Medicine

## 2022-05-11 VITALS — BP 101/65 | HR 76 | Temp 98.6°F | Resp 20 | Ht 71.0 in | Wt 166.0 lb

## 2022-05-11 DIAGNOSIS — C678 Malignant neoplasm of overlapping sites of bladder: Secondary | ICD-10-CM

## 2022-05-11 DIAGNOSIS — Z923 Personal history of irradiation: Secondary | ICD-10-CM | POA: Diagnosis not present

## 2022-05-11 DIAGNOSIS — L089 Local infection of the skin and subcutaneous tissue, unspecified: Secondary | ICD-10-CM | POA: Diagnosis not present

## 2022-05-11 DIAGNOSIS — I10 Essential (primary) hypertension: Secondary | ICD-10-CM | POA: Diagnosis not present

## 2022-05-11 DIAGNOSIS — Z801 Family history of malignant neoplasm of trachea, bronchus and lung: Secondary | ICD-10-CM | POA: Diagnosis not present

## 2022-05-11 DIAGNOSIS — C7951 Secondary malignant neoplasm of bone: Secondary | ICD-10-CM | POA: Diagnosis not present

## 2022-05-11 DIAGNOSIS — D72829 Elevated white blood cell count, unspecified: Secondary | ICD-10-CM | POA: Diagnosis not present

## 2022-05-11 DIAGNOSIS — I959 Hypotension, unspecified: Secondary | ICD-10-CM | POA: Diagnosis not present

## 2022-05-11 DIAGNOSIS — Z85828 Personal history of other malignant neoplasm of skin: Secondary | ICD-10-CM | POA: Diagnosis not present

## 2022-05-11 DIAGNOSIS — G893 Neoplasm related pain (acute) (chronic): Secondary | ICD-10-CM | POA: Diagnosis not present

## 2022-05-11 LAB — CBC WITH DIFFERENTIAL/PLATELET
Abs Immature Granulocytes: 0.06 10*3/uL (ref 0.00–0.07)
Basophils Absolute: 0 10*3/uL (ref 0.0–0.1)
Basophils Relative: 0 %
Eosinophils Absolute: 0.5 10*3/uL (ref 0.0–0.5)
Eosinophils Relative: 7 %
HCT: 35.9 % — ABNORMAL LOW (ref 39.0–52.0)
Hemoglobin: 11.2 g/dL — ABNORMAL LOW (ref 13.0–17.0)
Immature Granulocytes: 1 %
Lymphocytes Relative: 4 %
Lymphs Abs: 0.3 10*3/uL — ABNORMAL LOW (ref 0.7–4.0)
MCH: 30.9 pg (ref 26.0–34.0)
MCHC: 31.2 g/dL (ref 30.0–36.0)
MCV: 98.9 fL (ref 80.0–100.0)
Monocytes Absolute: 0.8 10*3/uL (ref 0.1–1.0)
Monocytes Relative: 11 %
Neutro Abs: 5.3 10*3/uL (ref 1.7–7.7)
Neutrophils Relative %: 77 %
Platelets: 353 10*3/uL (ref 150–400)
RBC: 3.63 MIL/uL — ABNORMAL LOW (ref 4.22–5.81)
RDW: 14.4 % (ref 11.5–15.5)
WBC: 6.9 10*3/uL (ref 4.0–10.5)
nRBC: 0 % (ref 0.0–0.2)

## 2022-05-11 LAB — COMPREHENSIVE METABOLIC PANEL
ALT: 34 U/L (ref 0–44)
AST: 35 U/L (ref 15–41)
Albumin: 2.7 g/dL — ABNORMAL LOW (ref 3.5–5.0)
Alkaline Phosphatase: 105 U/L (ref 38–126)
Anion gap: 9 (ref 5–15)
BUN: 17 mg/dL (ref 8–23)
CO2: 28 mmol/L (ref 22–32)
Calcium: 8.9 mg/dL (ref 8.9–10.3)
Chloride: 101 mmol/L (ref 98–111)
Creatinine, Ser: 0.89 mg/dL (ref 0.61–1.24)
GFR, Estimated: >60 mL/min (ref >60)
Glucose, Bld: 120 mg/dL — ABNORMAL HIGH (ref 70–99)
Potassium: 4.1 mmol/L (ref 3.5–5.1)
Sodium: 138 mmol/L (ref 135–145)
Total Bilirubin: 0.6 mg/dL (ref 0.3–1.2)
Total Protein: 6.6 g/dL (ref 6.5–8.1)

## 2022-05-11 MED ORDER — DEXAMETHASONE 2 MG PO TABS: 2.0000 mg | ORAL_TABLET | Freq: Every day | ORAL | 0 refills | Status: DC

## 2022-05-11 NOTE — Progress Notes (Signed)
Symptom Management [place] at [facility] [phone] [phone]  Patient Care Team: [name], MD as PCP - General (Infectious Diseases) Cammie Sickle, MD as Consulting Physician (Oncology) Jason Coop, NP as Nurse Practitioner (Hospice and Palliative Medicine) Karinda Cabriales, Kirt Boys, NP as Nurse Practitioner [facility] and Palliative Medicine)   NAME OF PATIENT: Christopher [name]  [id number]  [birth year]   DATE OF VISIT: 05/11/22  REASON FOR CONSULT: Christopher [name] is a 88 y.o. male with multiple medical problems including history of peritoneal/omental thickening of unclear etiology also with recurrent high-grade bladder cancer status post XRT at [facility].  PET scan February 17, 2022 revealed hypermetabolic metastatic osseous lesions, left supraclavicular lymph node involvement, right upper lobe, and hypermetabolic abdominal retroperitoneal adenopathy.  Patient was not felt to be a poor candidate for systemic chemotherapy given his age. He was started on immunotherapy instead.  He had persistent lumbar back pain likely secondary to metastatic lesions.  Patient is on XRT.  INTERVAL HISTORY: Patient was seen in [facility] on 05/01/2022 with complaint of persistent pain and poor oral intake.  Family requested increased dose of mirtazapine and adjustment of his pain regimen.  Patient was started on transdermal fentanyl and given Norco for breakthrough pain.  Patient subsequently has been more fatigued and drowsy.  He presents to [facility] today for follow-up.  Today, patient reports that he feels tired with no appetite.  He says he is sleeping a lot during the day and is weaker.  He denies fever or chills.  No shortness of breath or chest pain.  No nausea or vomiting.  He did have an episode of loose stools yesterday but that has resolved.  He continues to endorse back pain.  Denies urinary complaints.  Patient offers no further  specific complaints.  PAST MEDICAL HISTORY: Past Medical History:  Diagnosis Date   A-fib ([place])    Anemia    Arthritis    BACK   Bladder cancer (HCC)    BPH (benign prostatic hyperplasia)    Cancer (HCC)    skin cancer on face carcinoma   ED (erectile dysfunction)    Rosanna Randy disease    HLD (hyperlipidemia)    Hypertension    Macular degeneration    Mollusca contagiosa    Osteopenia    Presence of permanent cardiac pacemaker    Sensorineural hearing loss    Squamous cell carcinoma of skin 08/25/2019   R cheek    Squamous cell carcinoma of skin 02/12/2020   L neck    Squamous cell carcinoma of skin 11/30/2020   Right elbow, [facility]   Squamous cell carcinoma of skin 03/16/2021   right base of thumb [facility]   Squamous cell carcinoma of skin 03/16/2021   right preauricular - [facility] 11/17/21   Squamous cell carcinoma of skin 10/05/2021   Right dorsum hand - [facility]   Squamous cell carcinoma of skin 11/22/2021   right inf preauricular anterior, [facility]   Squamous cell carcinoma of skin 11/22/2021   right inf preauricular posterior, [facility]    PAST SURGICAL HISTORY:  Past Surgical History:  Procedure Laterality Date   CATARACT EXTRACTION Bilateral    CYSTOSCOPY WITH BIOPSY N/A 01/20/2020   Procedure: CYSTOSCOPY WITH BIOPSY;  Surgeon: Abbie Sons, MD;  Location: [facility] ORS;  Service: Urology;  Laterality: N/A;   CYSTOSCOPY WITH FULGERATION N/A 01/20/2020   Procedure: CYSTOSCOPY WITH FULGERATION;  Surgeon: Abbie Sons, MD;  Location: [facility] ORS;  Service:  Urology;  Laterality: N/A;   CYSTOSCOPY WITH FULGERATION N/A 04/19/2020   Procedure: [place] with clot evacuation;  Surgeon: Abbie Sons, MD;  Location: [facility] ORS;  Service: Urology;  Laterality: N/A;   CYSTOSCOPY WITH FULGERATION N/A 01/05/2021   Procedure: CYSTOSCOPY WITH FULGERATION;  Surgeon: Abbie Sons, MD;  Location: [facility] ORS;  Service: Urology;  Laterality: N/A;   HEMORRHOIDECTOMY WITH HEMORRHOID BANDING      KNEE ARTHROSCOPY Left    PACEMAKER INSERTION N/A 03/03/2015   Procedure: INSERTION DUAL LEAD PACEMAKER;  Surgeon: Isaias Cowman, MD;  Location: [facility] ORS;  Service: Cardiovascular;  Laterality: N/A;   PULSE GENERATOR IMPLANT N/A 10/18/2020   Procedure: UNILATERAL PULSE GENERATOR IMPLANT;  Surgeon: [name], MD;  Location: [facility] OR[facility];  Service: Neurosurgery;  Laterality: N/A;   SPINAL CORD STIMULATOR TRIAL N/A 10/11/2020   Procedure: PERCUTANEOUS SPINAL CORD STIMULATOR TRIAL;  Surgeon: [name], MD;  Location: [facility] ORS;  Service: Neurosurgery;  Laterality: N/A;   THORACIC LAMINECTOMY FOR SPINAL CORD STIMULATOR N/A 10/18/2020   Procedure: PERCUTANEOUS LEAD PLACEMENT;  Surgeon: [name], MD;  Location: [facility] ORS;  Service: Neurosurgery;  Laterality: N/A;   TONSILLECTOMY     TRANSURETHRAL RESECTION OF BLADDER TUMOR N/A 10/07/2019   Procedure: TRANSURETHRAL RESECTION OF BLADDER TUMOR (TURBT);  Surgeon: Abbie Sons, MD;  Location: [facility] ORS;  Service: Urology;  Laterality: N/A;   TRANSURETHRAL RESECTION OF BLADDER TUMOR N/A 04/13/2020   Procedure: TRANSURETHRAL RESECTION OF BLADDER TUMOR (TURBT);  Surgeon: Abbie Sons, MD;  Location: [facility] ORS;  Service: Urology;  Laterality: N/A;    HEMATOLOGY/ONCOLOGY HISTORY:  Oncology History Overview Note  # NOV 2020- [incidental/hematuria work-up]-subtle peritoneal thickening/plaque-like lesions; March 2021-CT scan progressive omental/peritoneal thickening  #Microscopic hematuria- sec to BPH [cystoscopy/CT urogram negative; Dr.Stoiff]; March 2021 CT scan-new lesion in the bladder diverticulum  # MAY-June 2021- severe IDA sec to hemauria [hb 8.5; Ferritin- ]  #July 2022-hemorrhagic shock/bladder cancer-s/p radiation [facility]; no chemotherapy.[Dr.Neilsen]  # AUG 11th, 2023- PET scan- Hypermetabolic metastatic osseous lesions, left supraclavicular lymph nodes, right upper lobe nodule and abdominal retroperitoneal adenopathy- BIOPSY- POSITIVE FOR  MALIGNANCY; QNS for IHC. BACK RT s/p March 10, 2022 '[]'$   # SEP 7th, 2023- Keytruda single agent.   # on eliquis ? A.fib/pacemaker;   Urothelial carcinoma of bladder ([place])  10/29/2019 Initial Diagnosis   Urothelial carcinoma of bladder ([place])   Cancer of overlapping sites of bladder ([place])  02/17/2022 Initial Diagnosis   Cancer of overlapping sites of bladder ([place])   02/17/2022 Cancer Staging   Staging form: Urinary Bladder, AJCC 8th Edition - Clinical: Stage Unknown (cTX, cN2, cM1) - Signed by Cammie Sickle, MD on 02/17/2022   03/16/2022 -  Chemotherapy   Patient is on Treatment Plan : BLADDER Pembrolizumab (200) q21d       ALLERGIES:  is allergic to atenolol.  MEDICATIONS:  Current Outpatient Medications  Medication Sig Dispense Refill   atorvastatin (LIPITOR) 10 MG tablet Take 10 mg by mouth in the morning.     calcium carbonate (OSCAL) 1500 (600 Ca) MG TABS tablet Take 600 mg of elemental calcium by mouth in the morning.     Cholecalciferol (VITAMIN D3) 50 MCG (2000 UT) TABS Take 2,000 Units by mouth in the morning.     diltiazem (CARDIZEM CD) 240 MG 24 hr capsule Take 1 capsule (240 mg total) by mouth daily. (Patient not taking: Reported on 04/27/2022) 30 capsule 0   ELIQUIS 5 MG TABS tablet Take 1  tablet by mouth daily.     ferrous sulfate 325 (65 FE) MG tablet Take 325 mg by mouth in the morning and at bedtime.     finasteride (PROSCAR) 5 MG tablet TAKE 1 TABLET DAILY (Patient taking differently: Take 5 mg by mouth daily.) 90 tablet 3   HYDROcodone-acetaminophen (NORCO/VICODIN) 5-325 MG tablet Take 1-2 tablets by mouth every 4 (four) hours as needed for moderate pain. 60 tablet 0   metoprolol tartrate (LOPRESSOR) 50 MG tablet Take 1 tablet (50 mg total) by mouth 2 (two) times daily. (Patient taking differently: Take 50 mg by mouth 2 (two) times daily. 2 in the morning and 2 qhs) 60 tablet 0   Multiple Vitamin (MULTIVITAMIN WITH MINERALS) TABS tablet Take 1 tablet by mouth  in the morning.     Multiple Vitamins-Minerals (PRESERVISION AREDS 2 PO) Take 1 tablet by mouth in the morning and at bedtime.     naloxone (NARCAN) nasal spray 4 mg/0.1 mL SPRAY 1 SPRAY INTO ONE NOSTRIL AS DIRECTED FOR OPIOID OVERDOSE (TURN PERSON ON SIDE AFTER DOSE. IF NO RESPONSE IN 2-3 MINUTES OR PERSON RESPONDS BUT RELAPSES, REPEAT USING A NEW SPRAY DEVICE AND SPRAY INTO THE OTHER NOSTRIL. CALL 911 AFTER USE.) * EMERGENCY USE ONLY * 1 each 0   Omega-3 Fatty Acids (FISH OIL) 1000 MG CAPS Take 1,000 mg by mouth daily.     tamsulosin (FLOMAX) 0.4 MG CAPS capsule Take 0.4 mg by mouth daily.     No current facility-administered medications for this visit.    VITAL SIGNS: BP 101/65   Pulse 76   Temp 98.6 F (37 C) (Tympanic)   Resp 20   Ht '5\' 11"'$  (1.803 m)   Wt 166 lb (75.3 kg)   SpO2 97%   BMI 23.15 kg/m  Filed Weights   05/11/22 1347  Weight: 166 lb (75.3 kg)     Estimated body mass index is 23.15 kg/m as calculated from the following:   Height as of this encounter: '5\' 11"'$  (1.803 m).   Weight as of this encounter: 166 lb (75.3 kg).  LABS: CBC:    Component Value Date/Time   WBC 6.9 05/11/2022 1323   HGB 11.2 (L) 05/11/2022 1323   HGB 13.2 01/13/2020 1412   HCT 35.9 (L) 05/11/2022 1323   HCT 39.5 01/13/2020 1412   PLT 353 05/11/2022 1323   PLT 222 01/13/2020 1412   MCV 98.9 05/11/2022 1323   MCV 90 01/13/2020 1412   NEUTROABS 5.3 05/11/2022 1323   NEUTROABS 3.6 01/13/2020 1412   LYMPHSABS 0.3 (L) 05/11/2022 1323   LYMPHSABS 1.1 01/13/2020 1412   MONOABS 0.8 05/11/2022 1323   EOSABS 0.5 05/11/2022 1323   EOSABS 0.1 01/13/2020 1412   BASOSABS 0.0 05/11/2022 1323   BASOSABS 0.0 01/13/2020 1412   Comprehensive Metabolic Panel:    Component Value Date/Time   NA 138 05/11/2022 1323   NA 141 01/13/2020 1412   K 4.1 05/11/2022 1323   K 4.4 08/10/2011 1443   CL 101 05/11/2022 1323   CO2 28 05/11/2022 1323   BUN 17 05/11/2022 1323   BUN 18 01/13/2020 1412    CREATININE 0.89 05/11/2022 1323   GLUCOSE 120 (H) 05/11/2022 1323   CALCIUM 8.9 05/11/2022 1323   AST 35 05/11/2022 1323   ALT 34 05/11/2022 1323   ALKPHOS 105 05/11/2022 1323   BILITOT 0.6 05/11/2022 1323   PROT 6.6 05/11/2022 1323   ALBUMIN 2.7 (L) 05/11/2022 1323    RADIOGRAPHIC STUDIES:  No results found.  PERFORMANCE STATUS (ECOG) : 3 - Symptomatic, >50% confined to bed  Review of Systems Unless otherwise noted, a complete review of systems is negative.  Physical Exam General: NAD Cardiovascular: regular rate and rhythm Pulmonary: clear ant fields Abdomen: soft, nontender, + bowel sounds GU: no suprapubic tenderness Extremities: no edema, no joint deformities Skin: no rashes Neurological: Weakness but otherwise nonfocal  IMPRESSION/PLAN: Weakness -daughters report the patient has had declining performance status over the past month but it is progressed over the past week.  He has been more drowsy, which may be attributed to medication changes.  Both mirtazapine and fentanyl were discontinued.  Labs grossly unchanged from baseline.  Patient is nontoxic-appearing.  Clinically, he looks the same to me as he did last week.  I have ordered home health per family request.  Discussed with family that unclear if patient will markedly improve.  Poor oral intake-likely secondary to stage IV bladder cancer +/- treatment effect.  No significant improvement with the dose adjustment of his mirtazapine.  Mirtazapine was ultimately discontinued due to sedating effects.  Discussed with Dr. Rogue Bussing and will start patient on low-dose dexamethasone until patient can be seen next week.  Neoplasm related pain-patient is status post XRT.  Patient was intolerant of MS Contin and was tried on fentanyl.  He became more drowsy so this was discontinued.  Continue Norco as needed for pain.  RTC next week to see Dr. Rogue Bussing.  Case and plan discussed with Dr. B  Patient expressed understanding and  was in agreement with this plan. He also understands that He can call clinic at any time with any questions, concerns, or complaints.   Thank you for allowing me to participate in the care of this very pleasant patient.   Time Total: 15 minutes  Visit consisted of counseling and education dealing with the complex and emotionally intense issues of symptom management in the setting of serious illness.Greater than 50%  of this time was spent counseling and coordinating care related to the above assessment and plan.  Signed by: Altha Harm, PhD, NP-C

## 2022-05-11 NOTE — Telephone Encounter (Signed)
Contacted Jason H with [facility] via telephone call. Alerted coordinator re: the new referral in epic.

## 2022-05-12 NOTE — Telephone Encounter (Signed)
Per Corene Cornea with [facility] - "[facility] will see this patient for RN and PT asap but they do not have OT and HHA available this week. They will get him started with OT and HHA once it becomes available."

## 2022-05-19 ENCOUNTER — Emergency Department: Payer: Medicare HMO

## 2022-05-19 ENCOUNTER — Inpatient Hospital Stay: Payer: Medicare HMO

## 2022-05-19 ENCOUNTER — Inpatient Hospital Stay
Admission: EM | Admit: 2022-05-19 | Discharge: 2022-05-25 | DRG: 315 | Disposition: A | Discharge status: Skilled Nursing Facility | Payer: Medicare HMO | Attending: Internal Medicine | Admitting: Internal Medicine

## 2022-05-19 ENCOUNTER — Other Ambulatory Visit: Payer: Self-pay

## 2022-05-19 ENCOUNTER — Encounter: Payer: Self-pay | Admitting: Internal Medicine

## 2022-05-19 ENCOUNTER — Ambulatory Visit: Payer: Medicare HMO

## 2022-05-19 ENCOUNTER — Encounter: Payer: Self-pay | Admitting: Medical Oncology

## 2022-05-19 ENCOUNTER — Inpatient Hospital Stay (HOSPITAL_BASED_OUTPATIENT_CLINIC_OR_DEPARTMENT_OTHER): Payer: Medicare HMO | Admitting: Medical Oncology

## 2022-05-19 VITALS — BP 78/56 | HR 82 | Temp 97.3°F | Wt 153.7 lb

## 2022-05-19 DIAGNOSIS — I959 Hypotension, unspecified: Secondary | ICD-10-CM

## 2022-05-19 DIAGNOSIS — C678 Malignant neoplasm of overlapping sites of bladder: Secondary | ICD-10-CM

## 2022-05-19 DIAGNOSIS — I484 Atypical atrial flutter: Secondary | ICD-10-CM | POA: Diagnosis not present

## 2022-05-19 DIAGNOSIS — Z950 Presence of cardiac pacemaker: Secondary | ICD-10-CM

## 2022-05-19 DIAGNOSIS — E871 Hypo-osmolality and hyponatremia: Secondary | ICD-10-CM | POA: Diagnosis present

## 2022-05-19 DIAGNOSIS — L89152 Pressure ulcer of sacral region, stage 2: Secondary | ICD-10-CM | POA: Diagnosis not present

## 2022-05-19 DIAGNOSIS — A419 Sepsis, unspecified organism: Principal | ICD-10-CM

## 2022-05-19 DIAGNOSIS — N400 Enlarged prostate without lower urinary tract symptoms: Secondary | ICD-10-CM | POA: Diagnosis present

## 2022-05-19 DIAGNOSIS — E440 Moderate protein-calorie malnutrition: Secondary | ICD-10-CM | POA: Diagnosis present

## 2022-05-19 DIAGNOSIS — Z888 Allergy status to other drugs, medicaments and biological substances status: Secondary | ICD-10-CM

## 2022-05-19 DIAGNOSIS — Z6821 Body mass index (BMI) 21.0-21.9, adult: Secondary | ICD-10-CM

## 2022-05-19 DIAGNOSIS — R000 Tachycardia, unspecified: Secondary | ICD-10-CM | POA: Diagnosis present

## 2022-05-19 DIAGNOSIS — I4892 Unspecified atrial flutter: Secondary | ICD-10-CM

## 2022-05-19 DIAGNOSIS — I10 Essential (primary) hypertension: Secondary | ICD-10-CM | POA: Diagnosis present

## 2022-05-19 DIAGNOSIS — Z833 Family history of diabetes mellitus: Secondary | ICD-10-CM

## 2022-05-19 DIAGNOSIS — D72829 Elevated white blood cell count, unspecified: Secondary | ICD-10-CM

## 2022-05-19 DIAGNOSIS — Z7901 Long term (current) use of anticoagulants: Secondary | ICD-10-CM

## 2022-05-19 DIAGNOSIS — R6510 Systemic inflammatory response syndrome (SIRS) of non-infectious origin without acute organ dysfunction: Secondary | ICD-10-CM | POA: Diagnosis not present

## 2022-05-19 DIAGNOSIS — Z801 Family history of malignant neoplasm of trachea, bronchus and lung: Secondary | ICD-10-CM

## 2022-05-19 DIAGNOSIS — L089 Local infection of the skin and subcutaneous tissue, unspecified: Secondary | ICD-10-CM

## 2022-05-19 DIAGNOSIS — Z1152 Encounter for screening for COVID-19: Secondary | ICD-10-CM

## 2022-05-19 DIAGNOSIS — L89322 Pressure ulcer of left buttock, stage 2: Secondary | ICD-10-CM | POA: Diagnosis present

## 2022-05-19 DIAGNOSIS — E785 Hyperlipidemia, unspecified: Secondary | ICD-10-CM | POA: Diagnosis present

## 2022-05-19 DIAGNOSIS — Z9841 Cataract extraction status, right eye: Secondary | ICD-10-CM

## 2022-05-19 DIAGNOSIS — C779 Secondary and unspecified malignant neoplasm of lymph node, unspecified: Secondary | ICD-10-CM | POA: Diagnosis present

## 2022-05-19 DIAGNOSIS — I480 Paroxysmal atrial fibrillation: Secondary | ICD-10-CM | POA: Diagnosis present

## 2022-05-19 DIAGNOSIS — C679 Malignant neoplasm of bladder, unspecified: Secondary | ICD-10-CM | POA: Diagnosis present

## 2022-05-19 DIAGNOSIS — H905 Unspecified sensorineural hearing loss: Secondary | ICD-10-CM | POA: Diagnosis present

## 2022-05-19 DIAGNOSIS — Z8551 Personal history of malignant neoplasm of bladder: Secondary | ICD-10-CM

## 2022-05-19 DIAGNOSIS — Z85828 Personal history of other malignant neoplasm of skin: Secondary | ICD-10-CM

## 2022-05-19 DIAGNOSIS — Z66 Do not resuscitate: Secondary | ICD-10-CM | POA: Diagnosis present

## 2022-05-19 DIAGNOSIS — I495 Sick sinus syndrome: Secondary | ICD-10-CM | POA: Diagnosis present

## 2022-05-19 DIAGNOSIS — Z9842 Cataract extraction status, left eye: Secondary | ICD-10-CM

## 2022-05-19 DIAGNOSIS — Z79899 Other long term (current) drug therapy: Secondary | ICD-10-CM

## 2022-05-19 LAB — CBC WITH DIFFERENTIAL/PLATELET
Abs Immature Granulocytes: 0.12 10*3/uL — ABNORMAL HIGH (ref 0.00–0.07)
Abs Immature Granulocytes: 0.12 10*3/uL — ABNORMAL HIGH (ref 0.00–0.07)
Basophils Absolute: 0 10*3/uL (ref 0.0–0.1)
Basophils Absolute: 0 10*3/uL (ref 0.0–0.1)
Basophils Relative: 0 %
Basophils Relative: 0 %
Eosinophils Absolute: 0 10*3/uL (ref 0.0–0.5)
Eosinophils Absolute: 0 10*3/uL (ref 0.0–0.5)
Eosinophils Relative: 0 %
Eosinophils Relative: 0 %
HCT: 38.6 % — ABNORMAL LOW (ref 39.0–52.0)
HCT: 38.5 % — ABNORMAL LOW (ref 39.0–52.0)
Hemoglobin: 12.1 g/dL — ABNORMAL LOW (ref 13.0–17.0)
Hemoglobin: 12.3 g/dL — ABNORMAL LOW (ref 13.0–17.0)
Immature Granulocytes: 1 %
Immature Granulocytes: 1 %
Lymphocytes Relative: 2 %
Lymphocytes Relative: 3 %
Lymphs Abs: 0.3 10*3/uL — ABNORMAL LOW (ref 0.7–4.0)
Lymphs Abs: 0.4 10*3/uL — ABNORMAL LOW (ref 0.7–4.0)
MCH: 30 pg (ref 26.0–34.0)
MCH: 30.2 pg (ref 26.0–34.0)
MCHC: 31.3 g/dL (ref 30.0–36.0)
MCHC: 31.9 g/dL (ref 30.0–36.0)
MCV: 95.8 fL (ref 80.0–100.0)
MCV: 94.6 fL (ref 80.0–100.0)
Monocytes Absolute: 1.1 10*3/uL — ABNORMAL HIGH (ref 0.1–1.0)
Monocytes Absolute: 1 10*3/uL (ref 0.1–1.0)
Monocytes Relative: 8 %
Monocytes Relative: 7 %
Neutro Abs: 11.6 10*3/uL — ABNORMAL HIGH (ref 1.7–7.7)
Neutro Abs: 12.3 10*3/uL — ABNORMAL HIGH (ref 1.7–7.7)
Neutrophils Relative %: 89 %
Neutrophils Relative %: 89 %
Platelets: 432 10*3/uL — ABNORMAL HIGH (ref 150–400)
Platelets: 432 10*3/uL — ABNORMAL HIGH (ref 150–400)
RBC: 4.03 MIL/uL — ABNORMAL LOW (ref 4.22–5.81)
RBC: 4.07 MIL/uL — ABNORMAL LOW (ref 4.22–5.81)
RDW: 14.3 % (ref 11.5–15.5)
RDW: 14.4 % (ref 11.5–15.5)
WBC: 13.1 10*3/uL — ABNORMAL HIGH (ref 4.0–10.5)
WBC: 13.8 10*3/uL — ABNORMAL HIGH (ref 4.0–10.5)
nRBC: 0 % (ref 0.0–0.2)
nRBC: 0 % (ref 0.0–0.2)

## 2022-05-19 LAB — COMPREHENSIVE METABOLIC PANEL
ALT: 56 U/L — ABNORMAL HIGH (ref 0–44)
ALT: 57 U/L — ABNORMAL HIGH (ref 0–44)
AST: 34 U/L (ref 15–41)
AST: 36 U/L (ref 15–41)
Albumin: 2.9 g/dL — ABNORMAL LOW (ref 3.5–5.0)
Albumin: 2.9 g/dL — ABNORMAL LOW (ref 3.5–5.0)
Alkaline Phosphatase: 126 U/L (ref 38–126)
Alkaline Phosphatase: 127 U/L — ABNORMAL HIGH (ref 38–126)
Anion gap: 10 (ref 5–15)
Anion gap: 9 (ref 5–15)
BUN: 19 mg/dL (ref 8–23)
BUN: 19 mg/dL (ref 8–23)
CO2: 28 mmol/L (ref 22–32)
CO2: 28 mmol/L (ref 22–32)
Calcium: 9.2 mg/dL (ref 8.9–10.3)
Calcium: 9.5 mg/dL (ref 8.9–10.3)
Chloride: 96 mmol/L — ABNORMAL LOW (ref 98–111)
Chloride: 99 mmol/L (ref 98–111)
Creatinine, Ser: 0.67 mg/dL (ref 0.61–1.24)
Creatinine, Ser: 0.78 mg/dL (ref 0.61–1.24)
GFR, Estimated: >60 mL/min (ref >60)
GFR, Estimated: >60 mL/min (ref >60)
Glucose, Bld: 110 mg/dL — ABNORMAL HIGH (ref 70–99)
Glucose, Bld: 111 mg/dL — ABNORMAL HIGH (ref 70–99)
Potassium: 4.1 mmol/L (ref 3.5–5.1)
Potassium: 4.2 mmol/L (ref 3.5–5.1)
Sodium: 134 mmol/L — ABNORMAL LOW (ref 135–145)
Sodium: 136 mmol/L (ref 135–145)
Total Bilirubin: 0.6 mg/dL (ref 0.3–1.2)
Total Bilirubin: 0.8 mg/dL (ref 0.3–1.2)
Total Protein: 6.7 g/dL (ref 6.5–8.1)
Total Protein: 6.9 g/dL (ref 6.5–8.1)

## 2022-05-19 LAB — MAGNESIUM: Magnesium: 2.1 mg/dL (ref 1.7–2.4)

## 2022-05-19 LAB — URINALYSIS, COMPLETE (UACMP) WITH MICROSCOPIC
Bilirubin Urine: NEGATIVE
Glucose, UA: NEGATIVE mg/dL
Hgb urine dipstick: NEGATIVE
Ketones, ur: NEGATIVE mg/dL
Leukocytes,Ua: NEGATIVE
Nitrite: NEGATIVE
Protein, ur: NEGATIVE mg/dL
Specific Gravity, Urine: 1.017 (ref 1.005–1.030)
pH: 6 (ref 5.0–8.0)

## 2022-05-19 LAB — SARS CORONAVIRUS 2 BY RT PCR: SARS Coronavirus 2 by RT PCR: NEGATIVE

## 2022-05-19 LAB — LACTIC ACID, PLASMA
Lactic Acid, Venous: 2.3 mmol/L (ref 0.5–1.9)
Lactic Acid, Venous: 1.7 mmol/L (ref 0.5–1.9)

## 2022-05-19 MED ORDER — APIXABAN 5 MG PO TABS
5.0000 mg | ORAL_TABLET | Freq: Two times a day (BID) | ORAL | Status: DC
  Administered 2022-05-19 – 2022-05-25 (×12): 5 mg via ORAL
  Filled 2022-05-19 (×12): qty 1

## 2022-05-19 MED ORDER — HYDROCODONE-ACETAMINOPHEN 5-325 MG PO TABS
1.0000 | ORAL_TABLET | Freq: Four times a day (QID) | ORAL | Status: DC | PRN
  Administered 2022-05-19 – 2022-05-24 (×7): 1 via ORAL
  Filled 2022-05-19 (×7): qty 1

## 2022-05-19 MED ORDER — VANCOMYCIN HCL IN DEXTROSE 1-5 GM/200ML-% IV SOLN
1000.0000 mg | Freq: Once | INTRAVENOUS | Status: AC
  Administered 2022-05-19: 1000 mg via INTRAVENOUS
  Filled 2022-05-19: qty 200

## 2022-05-19 MED ORDER — ACETAMINOPHEN 325 MG RE SUPP: 650.0000 mg | Freq: Four times a day (QID) | RECTAL | Status: DC | PRN

## 2022-05-19 MED ORDER — VITAMIN D 25 MCG (1000 UNIT) PO TABS
2000.0000 [IU] | ORAL_TABLET | Freq: Every morning | ORAL | Status: DC
  Administered 2022-05-20 – 2022-05-25 (×6): 2000 [IU] via ORAL
  Filled 2022-05-19 (×6): qty 2

## 2022-05-19 MED ORDER — IBUPROFEN 400 MG PO TABS: 400.0000 mg | ORAL_TABLET | Freq: Four times a day (QID) | ORAL | Status: DC | PRN

## 2022-05-19 MED ORDER — ONDANSETRON HCL 4 MG/2ML IJ SOLN: 4.0000 mg | Freq: Four times a day (QID) | INTRAMUSCULAR | Status: DC | PRN

## 2022-05-19 MED ORDER — SODIUM CHLORIDE 0.9 % IV SOLN
2.0000 g | Freq: Once | INTRAVENOUS | Status: AC
  Administered 2022-05-19: 2 g via INTRAVENOUS
  Filled 2022-05-19: qty 12.5

## 2022-05-19 MED ORDER — TAMSULOSIN HCL 0.4 MG PO CAPS
0.4000 mg | ORAL_CAPSULE | Freq: Every day | ORAL | Status: DC
  Administered 2022-05-20 – 2022-05-25 (×6): 0.4 mg via ORAL
  Filled 2022-05-19 (×6): qty 1

## 2022-05-19 MED ORDER — SODIUM CHLORIDE 0.9 % IV SOLN
2.0000 g | Freq: Two times a day (BID) | INTRAVENOUS | Status: DC
  Administered 2022-05-20: 2 g via INTRAVENOUS
  Filled 2022-05-19 (×2): qty 12.5

## 2022-05-19 MED ORDER — ONDANSETRON HCL 4 MG PO TABS: 4.0000 mg | ORAL_TABLET | Freq: Four times a day (QID) | ORAL | Status: DC | PRN

## 2022-05-19 MED ORDER — ACETAMINOPHEN 325 MG PO TABS: 650.0000 mg | ORAL_TABLET | Freq: Four times a day (QID) | ORAL | Status: DC | PRN

## 2022-05-19 MED ORDER — ADULT MULTIVITAMIN W/MINERALS CH
1.0000 | ORAL_TABLET | Freq: Every day | ORAL | Status: DC
  Administered 2022-05-19 – 2022-05-25 (×7): 1 via ORAL
  Filled 2022-05-19 (×7): qty 1

## 2022-05-19 MED ORDER — LACTATED RINGERS IV BOLUS (SEPSIS)
1000.0000 mL | Freq: Once | INTRAVENOUS | Status: AC
  Administered 2022-05-19: 1000 mL via INTRAVENOUS

## 2022-05-19 MED ORDER — ATORVASTATIN CALCIUM 10 MG PO TABS
10.0000 mg | ORAL_TABLET | Freq: Every morning | ORAL | Status: DC
  Administered 2022-05-20 – 2022-05-25 (×4): 10 mg via ORAL
  Filled 2022-05-19 (×4): qty 1

## 2022-05-19 MED ORDER — METOPROLOL TARTRATE 25 MG PO TABS
25.0000 mg | ORAL_TABLET | Freq: Two times a day (BID) | ORAL | Status: DC
  Administered 2022-05-20 – 2022-05-22 (×5): 25 mg via ORAL
  Filled 2022-05-19 (×5): qty 1

## 2022-05-19 MED ORDER — CALCIUM CARBONATE 1250 (500 CA) MG PO TABS
500.0000 mg | ORAL_TABLET | Freq: Every morning | ORAL | Status: DC
  Administered 2022-05-20 – 2022-05-25 (×6): 1250 mg via ORAL
  Filled 2022-05-19 (×6): qty 1

## 2022-05-19 MED ORDER — METRONIDAZOLE 500 MG/100ML IV SOLN
500.0000 mg | Freq: Once | INTRAVENOUS | Status: AC
  Administered 2022-05-19: 500 mg via INTRAVENOUS
  Filled 2022-05-19: qty 100

## 2022-05-19 MED ORDER — VANCOMYCIN HCL 750 MG/150ML IV SOLN
750.0000 mg | Freq: Two times a day (BID) | INTRAVENOUS | Status: DC
  Administered 2022-05-19: 750 mg via INTRAVENOUS
  Filled 2022-05-19 (×2): qty 150

## 2022-05-19 MED ORDER — POLYETHYLENE GLYCOL 3350 17 G PO PACK
17.0000 g | PACK | Freq: Every day | ORAL | Status: DC
  Administered 2022-05-19 – 2022-05-20 (×2): 17 g via ORAL
  Filled 2022-05-19 (×2): qty 1

## 2022-05-19 MED ORDER — LACTATED RINGERS IV SOLN: INTRAVENOUS | Status: DC

## 2022-05-19 MED ORDER — METOPROLOL SUCCINATE ER 25 MG PO TB24: 25.0000 mg | ORAL_TABLET | Freq: Two times a day (BID) | ORAL | Status: DC

## 2022-05-19 MED ORDER — SODIUM CHLORIDE 0.9% FLUSH
3.0000 mL | Freq: Two times a day (BID) | INTRAVENOUS | Status: DC
  Administered 2022-05-19 – 2022-05-25 (×9): 3 mL via INTRAVENOUS

## 2022-05-19 NOTE — ED Notes (Signed)
CRITICAL TEST RESULT Lactic acid 2.3 Dr. Corky Downs notified [phone]

## 2022-05-19 NOTE — ED Notes (Signed)
Rad at bedside.

## 2022-05-19 NOTE — Assessment & Plan Note (Addendum)
Patient presented with significant hypotension at his oncologist's office this morning that has subsequently improved since arriving to the ER.  He is actually on the hypertensive side after receiving fluids.  Given he also has leukocytosis while on immunomodulatory therapy, there is concern for underlying etiology is infection.  Differential also includes hypovolemia, however patient denies poor p.o. intake.  - Given blood pressure has improved, will gradually restart his home antihypertensives.  We will plan to restart metoprolol today and diltiazem tomorrow. - Blood and urine cultures pending - Hold off on further IV fluids

## 2022-05-19 NOTE — Assessment & Plan Note (Signed)
Stage IV metastatic urothelial carcinoma of the bladder with current metastasis involving lymph nodes and bone.  He is undergoing immunotherapy with Keytruda after being deemed a poor candidate for chemotherapy.

## 2022-05-19 NOTE — H&P (Signed)
History and Physical    Patient: Christopher [name] WJX:[id number] DOB: Sep 15, 1927 DOA: 05/19/2022 DOS: the patient was seen and examined on 05/19/2022 PCP: [name], MD  Patient coming from: Home  Chief Complaint:  Chief Complaint  Patient presents with   Code Sepsis   HPI: Christopher [name] is a 88 y.o. male with medical history significant of stage IV metastatic bladder cancer on Keytruda, Rosanna Randy disease, hypertension, hyperlipidemia, atrial fibrillation, who presents to the ED with hypotension.  Christopher [name] presented to his oncologist's office today for his planned infusion of Keytruda.  Vital signs obtained at that time demonstrated blood pressure of 78/56 with heart rate of 82.  He states that when he woke up this morning, he felt "out of sorts" but could not pinpoint any particular symptoms.  He denies any fever, chills, congestion, dizziness, chest pain, shortness of breath, palpitations, nausea, vomiting, diarrhea, dysuria, urinary urgency or frequency.  He notes that he has a pressure sore on his bottom that has been assessed by his primary care doctor on 11/08.  At that time, Dr. Ola Spurr noted mild surrounding erythema and induration.  Due to this, patient was started on doxycycline and he has been to continue as prescribed.  ED course: On arrival to the ED, patient's blood pressure had improved to 118/68 with heart rate of 80.  He was afebrile at 98.2.  He is saturating at 96% on room air with respiratory rate of 12.  Initial work-up remarkable for WBC of 13.8, hemoglobin of 12.3, and platelets of 432.  CMP with no significant electrolyte derangements.  Lactic acid elevated 2.3.  TR[facility] contacted for admission for further work-up.  Review of Systems: As mentioned in the history of present illness. All other systems reviewed and are negative. Past Medical History:  Diagnosis Date   A-fib ([place])    Anemia    Arthritis    BACK   Bladder cancer (HCC)    BPH (benign  prostatic hyperplasia)    Cancer (HCC)    skin cancer on face carcinoma   ED (erectile dysfunction)    Rosanna Randy disease    HLD (hyperlipidemia)    Hypertension    Macular degeneration    Mollusca contagiosa    Osteopenia    Presence of permanent cardiac pacemaker    Sensorineural hearing loss    Squamous cell carcinoma of skin 08/25/2019   R cheek    Squamous cell carcinoma of skin 02/12/2020   L neck    Squamous cell carcinoma of skin 11/30/2020   Right elbow, [facility]   Squamous cell carcinoma of skin 03/16/2021   right base of thumb [facility]   Squamous cell carcinoma of skin 03/16/2021   right preauricular - [facility] 11/17/21   Squamous cell carcinoma of skin 10/05/2021   Right dorsum hand - [facility]   Squamous cell carcinoma of skin 11/22/2021   right inf preauricular anterior, [facility]   Squamous cell carcinoma of skin 11/22/2021   right inf preauricular posterior, [facility]   Past Surgical History:  Procedure Laterality Date   CATARACT EXTRACTION Bilateral    CYSTOSCOPY WITH BIOPSY N/A 01/20/2020   Procedure: CYSTOSCOPY WITH BIOPSY;  Surgeon: Abbie Sons, MD;  Location: [facility];  Service: Urology;  Laterality: N/A;   CYSTOSCOPY WITH FULGERATION N/A 01/20/2020   Procedure: CYSTOSCOPY WITH FULGERATION;  Surgeon: Abbie Sons, MD;  Location: [facility];  Service: Urology;  Laterality: N/A;   CYSTOSCOPY WITH FULGERATION N/A 04/19/2020   Procedure: CYSTOSCOPY  WITH FULGERATION with clot evacuation;  Surgeon: Abbie Sons, MD;  Location: [facility];  Service: Urology;  Laterality: N/A;   CYSTOSCOPY WITH FULGERATION N/A 01/05/2021   Procedure: CYSTOSCOPY WITH FULGERATION;  Surgeon: Abbie Sons, MD;  Location: [facility];  Service: Urology;  Laterality: N/A;   HEMORRHOIDECTOMY WITH HEMORRHOID BANDING     KNEE ARTHROSCOPY Left    PACEMAKER INSERTION N/A 03/03/2015   Procedure: INSERTION DUAL LEAD PACEMAKER;  Surgeon: Isaias Cowman, MD;  Location: [facility];  Service: Cardiovascular;   Laterality: N/A;   PULSE GENERATOR IMPLANT N/A 10/18/2020   Procedure: UNILATERAL PULSE GENERATOR IMPLANT;  Surgeon: [name], MD;  Location: [facility];  Service: Neurosurgery;  Laterality: N/A;   SPINAL CORD STIMULATOR TRIAL N/A 10/11/2020   Procedure: PERCUTANEOUS SPINAL CORD STIMULATOR TRIAL;  Surgeon: [name], MD;  Location: [facility];  Service: Neurosurgery;  Laterality: N/A;   THORACIC LAMINECTOMY FOR SPINAL CORD STIMULATOR N/A 10/18/2020   Procedure: PERCUTANEOUS LEAD PLACEMENT;  Surgeon: [name], MD;  Location: [facility];  Service: Neurosurgery;  Laterality: N/A;   TONSILLECTOMY     TRANSURETHRAL RESECTION OF BLADDER TUMOR N/A 10/07/2019   Procedure: TRANSURETHRAL RESECTION OF BLADDER TUMOR (TURBT);  Surgeon: Abbie Sons, MD;  Location: [facility];  Service: Urology;  Laterality: N/A;   TRANSURETHRAL RESECTION OF BLADDER TUMOR N/A 04/13/2020   Procedure: TRANSURETHRAL RESECTION OF BLADDER TUMOR (TURBT);  Surgeon: Abbie Sons, MD;  Location: [facility];  Service: Urology;  Laterality: N/A;   Social History:  reports that he has never smoked. He has never used smokeless tobacco. He reports current alcohol use of about 7.0 standard drinks of alcohol per week. He reports that he does not use drugs.  Allergies  Allergen Reactions   Atenolol     Happened a long time ago and can't recall    Family History  Problem Relation Age of Onset   Diabetes Mother    Lung cancer Father    Cancer Paternal Uncle    Prostate cancer Neg Hx    Bladder Cancer Neg Hx    Kidney cancer Neg Hx     Prior to Admission medications   Medication Sig Start Date End Date Taking? Authorizing Provider  atorvastatin (LIPITOR) 10 MG tablet Take 10 mg by mouth in the morning. 11/18/14  Yes [provider]  calcium carbonate (OSCAL) 1500 (600 Ca) MG TABS tablet Take 600 mg of elemental calcium by mouth in the morning.   Yes [provider]  Cholecalciferol (VITAMIN D3) 50 MCG (2000 UT)  TABS Take 2,000 Units by mouth in the morning.   Yes [provider]  dexamethasone (DECADRON) 2 MG tablet Take 1 tablet (2 mg total) by mouth daily. 05/11/22  Yes Borders, Kirt Boys, NP  diltiazem (CARDIZEM CD) 240 MG 24 hr capsule Take 1 capsule (240 mg total) by mouth daily. 01/21/21 05/19/22 Yes Wyvonnia Dusky, MD  doxycycline (VIBRAMYCIN) 100 MG capsule Take by mouth. 05/17/22 05/27/22 Yes [provider]  ELIQUIS 5 MG TABS tablet Take 1 tablet by mouth 2 (two) times daily. 02/23/22  Yes [provider]  ferrous sulfate 325 (65 FE) MG tablet Take 325 mg by mouth in the morning and at bedtime.   Yes [provider]  finasteride (PROSCAR) 5 MG tablet TAKE 1 TABLET DAILY Patient taking differently: Take 5 mg by mouth daily. 12/02/20  Yes Stoioff, Ronda Fairly, MD  HYDROcodone-acetaminophen (NORCO/VICODIN) 5-325 MG tablet Take 1-2 tablets by mouth every  4 (four) hours as needed for moderate pain. 05/01/22  Yes Borders, Kirt Boys, NP  metoprolol succinate (TOPROL-XL) 25 MG 24 hr tablet Take 25 mg by mouth in the morning and at bedtime.   Yes [provider]  Multiple Vitamin (MULTIVITAMIN WITH MINERALS) TABS tablet Take 1 tablet by mouth in the morning.   Yes [provider]  Multiple Vitamins-Minerals (PRESERVISION AREDS 2 PO) Take 1 tablet by mouth in the morning and at bedtime.   Yes [provider]  Omega-3 Fatty Acids (FISH OIL) 1000 MG CAPS Take 1,000 mg by mouth daily.   Yes [provider]  tamsulosin (FLOMAX) 0.4 MG CAPS capsule Take 0.4 mg by mouth daily.   Yes [provider]  metoprolol tartrate (LOPRESSOR) 50 MG tablet Take 1 tablet (50 mg total) by mouth 2 (two) times daily. Patient not taking: Reported on 05/19/2022 01/20/21 05/11/22  Wyvonnia Dusky, MD  mirtazapine (REMERON) 15 MG tablet Take 15 mg by mouth at bedtime. Patient not taking: Reported on 05/19/2022 05/08/22   [provider]  naloxone  (NARCAN) nasal spray 4 mg/0.1 mL SPRAY 1 SPRAY INTO ONE NOSTRIL AS DIRECTED FOR OPIOID OVERDOSE (TURN PERSON ON SIDE AFTER DOSE. IF NO RESPONSE IN 2-3 MINUTES OR PERSON RESPONDS BUT RELAPSES, REPEAT USING A NEW SPRAY DEVICE AND SPRAY INTO THE OTHER NOSTRIL. CALL 911 AFTER USE.) * EMERGENCY USE ONLY * Patient not taking: Reported on 05/11/2022 05/01/22   Irean Hong, NP    Physical Exam: Vitals:   05/19/22 1515 05/19/22 1530 05/19/22 1545 05/19/22 1600  BP:  (!) 153/86  (!) 146/93  Pulse: 72 72 72 73  Resp: '13 15 16 13  '$ Temp:      TempSrc:      SpO2: 97% 98% 95% 96%  Weight:      Height:       Physical Exam Vitals and nursing note reviewed.  Constitutional:      General: He is not in acute distress.    Appearance: He is normal weight. He is not toxic-appearing.  HENT:     Head: Normocephalic and atraumatic.     Mouth/Throat:     Mouth: Mucous membranes are moist.     Pharynx: Oropharynx is clear.     Comments: Bilateral buccal white lesions that are non-painful. Please see pictures below.  Eyes:     Extraocular Movements: Extraocular movements intact.     Conjunctiva/sclera: Conjunctivae normal.     Pupils: Pupils are equal, round, and reactive to light.  Cardiovascular:     Rate and Rhythm: Normal rate and regular rhythm.     Heart sounds: No murmur heard.    No gallop.  Pulmonary:     Effort: Pulmonary effort is normal. No respiratory distress.     Breath sounds: Normal breath sounds. No wheezing or rales.  Abdominal:     General: Bowel sounds are normal. There is no distension.     Palpations: Abdomen is soft.     Tenderness: There is no abdominal tenderness. There is no guarding.  Musculoskeletal:     Cervical back: Neck supple.     Right lower leg: Edema (Trace up to mid ankle) present.     Left lower leg: Edema (Trace up to mid ankle) present.  Skin:    General: Skin is warm and dry.     Comments: Stage II pressure ulcer located on the sacrum extending to the  perirectal region.  No purulent drainage, induration,  or blistering.  Neurological:     General: No focal deficit present.     Mental Status: He is alert and oriented to person, place, and time.  Psychiatric:        Mood and Affect: Mood normal.        Behavior: Behavior normal.      Data Reviewed: CBC notable for WBC of 13.1, hemoglobin of 12.1, and platelets of 432.  CMP with sodium of 134, chloride 96, glucose 110, albumin 2.9 and ALT of 56.  Magnesium within normal limits at 2.1.  Lactic acid elevated at 2.3.  Chest x-ray personally reviewed.  Mildly increased interstitial markings but no focal opacities noted.  No pleural effusions.  EKG personally reviewed.  Atrial flutter with variable conduction. Reviewed prior EKGs in 2022, 2021 and 2019. EKG on 05/19/2018 with atrial flutter with vriable AV block.   There are no new results to review at this time.  Assessment and Plan: * Hypotension Patient presented with significant hypotension at his oncologist's office this morning that has subsequently improved since arriving to the ER.  He is actually on the hypertensive side after receiving fluids.  Given he also has leukocytosis while on immunomodulatory therapy, there is concern for underlying etiology is infection.  Differential also includes hypovolemia, however patient denies poor p.o. intake.  - Given blood pressure has improved, will gradually restart his home antihypertensives.  We will plan to restart metoprolol today and diltiazem tomorrow. - Blood and urine cultures pending - Hold off on further IV fluids  Leukocytosis Potentially secondary to steroids as patient just finished a course of Decadron, however given hypotension, there is concern for underlying infection.  No source on examination.  Patient does have a stage II pressure ulcer, but no evidence of induration or purulent drainage.  He does also have some white plaques in his mouth, but these are nonpainful and likely  leukoplakia.  No GI symptoms at this time so we will discontinue metronidazole and continue with Vancomycin and cefepime until culture data has returned.  - Blood and urine cultures pending - Continue Vanco and cefepime - Discontinue home doxycycline for now  Atrial flutter [facility]) Patient has a prior history of atrial fibrillation with EKG in 2019 demonstrating atrial flutter.  Today, he is presenting with atrial flutter with variable conduction.  He is asymptomatic and rate is controlled.  He is already anticoagulated.  - Telemetry monitoring - Plan to restart metoprolol today and diltiazem tomorrow if blood pressure remains stable - Continue home Eliquis  Pressure ulcer of coccygeal region, stage 2 ([place]) Patient has a pressure ulcer on his buttocks that has been present for couple weeks after medication changes made him increasingly somnolent.  He has a scheduled appointment with the wound care center on 11/13.  No evidence of infection at this time.  - Wound care consult placed  Urothelial carcinoma of bladder ([place]) Stage IV metastatic urothelial carcinoma of the bladder with current metastasis involving lymph nodes and bone.  He is undergoing immunotherapy with Keytruda after being deemed a poor candidate for chemotherapy.  Advance Care Planning:   Code Status: DNR.  Discussed with patient and his daughter at bedside.  He has previously signed a DNR/DNI form and would like it continued at this time.  Consults: None  Family Communication: Patient's daughter updated at bedside  Severity of Illness: The appropriate patient status for this patient is OBSERVATION. Observation status is judged to be reasonable and necessary in order to provide  the required intensity of service to ensure the patient's safety. The patient's presenting symptoms, physical exam findings, and initial radiographic and laboratory data in the context of their medical condition is felt to place them at decreased risk  for further clinical deterioration. Furthermore, it is anticipated that the patient will be medically stable for discharge from the hospital within 2 midnights of admission.   Author: Jose Persia, MD 05/19/2022 4:18 PM  For on call review [email or web address].

## 2022-05-19 NOTE — Assessment & Plan Note (Signed)
with variable conduction.  He is asymptomatic and rate is controlled.  He is already anticoagulated. --cont home Lopressor - Continue home Eliquis  Addendum: Around 6:30 pm, after pt worked with PT, his HR went up to 150's, which didn't go down with rest.  EKG showed Aflutter 2:1 with rate of 152.  Pt was asymptomatic, and BP acceptable.  Plan: --IV dilt 10 mg x1, if rate not controlled after 20 min, give a 2nd dose of 10 mg.  If rate not controlled after 2nd dose, then transfer to PCU for IV dilt gtt.

## 2022-05-19 NOTE — ED Provider Notes (Signed)
[facility] Provider Note    Event Date/Time   First MD Initiated Contact with Patient 05/19/22 1210     (approximate)   History   Code Sepsis   HPI  Christopher [name] is a 88 y.o. male with a history of bladder cancer sent over from cancer center for evaluation after found to be weak with low blood pressure and elevated heart rate.  Patient does not think that he has any fevers.  He denies cough or shortness of breath.  No abdominal pain.  No dysuria reported.  He has been feeling weak and his energy has been low over the last several days     Physical Exam   Triage Vital Signs: ED Triage Vitals  Enc Vitals Group     BP 05/19/22 1215 97/70     Pulse Rate 05/19/22 1215 (!) 109     Resp 05/19/22 1215 (!) 27     Temp 05/19/22 1207 98.2 F (36.8 C)     Temp Source 05/19/22 1207 Oral     SpO2 05/19/22 1215 97 %     Weight 05/19/22 1212 69.7 kg (153 lb 10.6 oz)     Height 05/19/22 1212 1.803 m ('5\' 11"'$ )     Head Circumference --      Peak Flow --      Pain Score 05/19/22 1212 0     Pain Loc --      Pain Edu? --      Excl. in [place]? --     Most recent vital signs: Vitals:   05/19/22 1300 05/19/22 1315  BP: 118/68   Pulse: 71 65  Resp: 12 11  Temp:    SpO2: 94% 93%     General: Awake, no distress.  CV:  Good peripheral perfusion.  Resp:  Normal effort.  Abd:  No distention.  Other:  Patient with decubitus ulcer on the left buttock primarily, stage II/III, mild surrounding erythema   ED Results / Procedures / Treatments   Labs (all labs ordered are listed, but only abnormal results are displayed) Labs Reviewed  LACTIC ACID, PLASMA - Abnormal; Notable for the following components:      Result Value   Lactic Acid, Venous 2.3 (*)    All other components within normal limits  COMPREHENSIVE METABOLIC PANEL - Abnormal; Notable for the following components:   Glucose, Bld 111 (*)    Albumin 2.9 (*)    ALT 57 (*)    Alkaline Phosphatase  127 (*)    All other components within normal limits  CBC WITH DIFFERENTIAL/PLATELET - Abnormal; Notable for the following components:   WBC 13.8 (*)    RBC 4.07 (*)    Hemoglobin 12.3 (*)    HCT 38.5 (*)    Platelets 432 (*)    Neutro Abs 12.3 (*)    Lymphs Abs 0.4 (*)    Abs Immature Granulocytes 0.12 (*)    All other components within normal limits  URINE CULTURE  CULTURE, BLOOD (SINGLE)  CULTURE, BLOOD (SINGLE)  LACTIC ACID, PLASMA  URINALYSIS, COMPLETE (UACMP) WITH MICROSCOPIC     EKG  ED ECG REPORT I, Lavonia Drafts, the attending physician, personally viewed and interpreted this ECG.  Date: 05/19/2022  Rhythm: Atrial fibrillation QRS Axis: normal Intervals: Abnormal ST/T Wave abnormalities: normal Narrative Interpretation: no evidence of acute ischemia    RADIOLOGY Chest x-ray viewed interpreted by me, no acute abnormality    PROCEDURES:  Critical Care performed: yes  CRITICAL CARE Performed by: Lavonia Drafts   Total critical care time: 30 minutes  Critical care time was exclusive of separately billable procedures and treating other patients.  Critical care was necessary to treat or prevent imminent or life-threatening deterioration.  Critical care was time spent personally by me on the following activities: development of treatment plan with patient and/or surrogate as well as nursing, discussions with consultants, evaluation of patient's response to treatment, examination of patient, obtaining history from patient or surrogate, ordering and performing treatments and interventions, ordering and review of laboratory studies, ordering and review of radiographic studies, pulse oximetry and re-evaluation of patient's condition.   Procedures   MEDICATIONS ORDERED IN ED: Medications  lactated ringers infusion ( Intravenous New Bag/Given 05/19/22 1332)  metroNIDAZOLE (FLAGYL) IVPB 500 mg (500 mg Intravenous New Bag/Given 05/19/22 1425)  vancomycin  (VANCOCIN) IVPB 1000 mg/200 mL premix (1,000 mg Intravenous New Bag/Given 05/19/22 1422)  lactated ringers bolus 1,000 mL (0 mLs Intravenous Stopped 05/19/22 1315)  ceFEPIme (MAXIPIME) 2 g in sodium chloride 0.9 % 100 mL IVPB (0 g Intravenous Stopped 05/19/22 1407)     IMPRESSION / MDM / ASSESSMENT AND PLAN / ED COURSE  I reviewed the triage vital signs and the nursing notes. Patient's presentation is most consistent with acute presentation with potential threat to life or bodily function.  Patient presents with generalized weakness as detailed above.  Reportedly lower blood pressure at cancer center.  Blood pressure here is 118/68.  Heart rate is overall reassuring.  Differential includes dehydration, sepsis, pneumonia, cellulitis  Lab work reviewed and is elevated white blood cell count of 13.8 with lactic acid of 2.3.  Chest x-ray without evidence of pneumonia  Given the elevated lactic acid, elevated white blood cell count, will start the patient on IV antibiotics, code sepsis activated.  Have discussed with the hospitalist for admission      FINAL CLINICAL IMPRESSION(S) / ED DIAGNOSES   Final diagnoses:  Sepsis, due to unspecified organism, unspecified whether acute organ dysfunction present [facility])     Rx / DC Orders   ED Discharge Orders     None        Note:  This document was prepared using Dragon voice recognition software and may include unintentional dictation errors.   Lavonia Drafts, MD 05/19/22 [phone]

## 2022-05-19 NOTE — Progress Notes (Signed)
CODE SEPSIS - PHARMACY COMMUNICATION  **Broad Spectrum Antibiotics should be administered within 1 hour of Sepsis diagnosis**  Time Code Sepsis Called/Page Received: 1303  Antibiotics Ordered: vancomycin 1 gram x 1 and cefepime 2 grams x 1  Time of 1st antibiotic administration: 1337  Additional action taken by pharmacy: N/A     Alison Murray ,PharmD Clinical Pharmacist  05/19/2022  1:59 PM

## 2022-05-19 NOTE — ED Triage Notes (Signed)
Pt sent here from the cancer center for sepsis. Pt bp noted to be hypotensive at the center.

## 2022-05-19 NOTE — Progress Notes (Signed)
PHARMACY -  BRIEF ANTIBIOTIC NOTE   Pharmacy has received consult(s) for vancomycin and cefepime from an ED provider.  The patient's profile has been reviewed for ht/wt/allergies/indication/available labs.    One time order(s) placed for vancomycin 1 gram x 1 and cefepime 2 grams x 1.  Further antibiotics/pharmacy consults should be ordered by admitting physician if indicated.                       Thank you, Alison Murray 05/19/2022  1:53 PM

## 2022-05-19 NOTE — Progress Notes (Signed)
[place] NOTE  Patient Care Team: [name], MD as PCP - General (Infectious Diseases) Cammie Sickle, MD as Consulting Physician (Oncology) Jason Coop, NP as Nurse Practitioner (Hospice and Palliative Medicine) Borders, Kirt Boys, NP as Nurse Practitioner (Hospice and Palliative Medicine)  CHIEF COMPLAINTS/PURPOSE OF CONSULTATION: Bladder cancer  Oncology History Overview Note  # NOV 2020- [incidental/hematuria work-up]-subtle peritoneal thickening/plaque-like lesions; March 2021-CT scan progressive omental/peritoneal thickening  #Microscopic hematuria- sec to BPH [cystoscopy/CT urogram negative; Dr.Stoiff]; March 2021 CT scan-new lesion in the bladder diverticulum  # MAY-June 2021- severe IDA sec to hemauria [hb 8.5; Ferritin- ]  #July 2022-hemorrhagic shock/bladder cancer-s/p radiation [facility]; no chemotherapy.[Dr.Neilsen]  # AUG 11th, 2023- PET scan- Hypermetabolic metastatic osseous lesions, left supraclavicular lymph nodes, right upper lobe nodule and abdominal retroperitoneal adenopathy- BIOPSY- POSITIVE FOR MALIGNANCY; QNS for IHC. BACK RT s/p March 10, 2022 '[]'$   # SEP 7th, 2023- Keytruda single agent.   # on eliquis ? A.fib/pacemaker;   Urothelial carcinoma of bladder ([place])  10/29/2019 Initial Diagnosis   Urothelial carcinoma of bladder (HCC)   Cancer of overlapping sites of bladder ([place])  02/17/2022 Initial Diagnosis   Cancer of overlapping sites of bladder ([place])   02/17/2022 Cancer Staging   Staging form: Urinary Bladder, AJCC 8th Edition - Clinical: Stage Unknown (cTX, cN2, cM1) - Signed by Cammie Sickle, MD on 02/17/2022   03/16/2022 -  Chemotherapy   Patient is on Treatment Plan : BLADDER Pembrolizumab (200) q21d        HISTORY OF PRESENTING ILLNESS: pt ambulating  with a rolling walker ; is accompanied by his daughter  Christopher [name] 88 y.o.  male history of chronic back pain; and multiple  comorbidities with LIKELY recurrent /STAGE IV bladder malignancy [supraclavicular lymph node positive for malignancy; QNS for IHC] currently on single agent Keytruda is here for follow-up.  Today they report that he is not feeling well. He feels weak, "out of sorts" and is in pain from a infection of his left buttock. He was seen by his PCP yesterday and given doxycyline 100 mg BID to start. He started this yesterday and is tolerating it well. He is feeling worse from yesterday to today. No fever but has had possible chills. No vomiting. Mild confusion which is new as of early this morning. Pain rated 5/10 in nature.   Review of Systems  Constitutional:  Positive for malaise/fatigue and weight loss. Negative for chills, diaphoresis and fever.  HENT:  Negative for nosebleeds and sore throat.   Eyes:  Negative for double vision.  Respiratory:  Negative for cough, hemoptysis, sputum production and wheezing.   Cardiovascular:  Negative for chest pain, palpitations, orthopnea and leg swelling.  Gastrointestinal:  Positive for constipation. Negative for abdominal pain, blood in stool, diarrhea, heartburn, melena, nausea and vomiting.  Genitourinary:  Positive for hematuria. Negative for dysuria, frequency and urgency.  Musculoskeletal:  Positive for back pain and joint pain.  Skin: Negative.  Negative for itching and rash.  Neurological:  Negative for dizziness, tingling, focal weakness, weakness and headaches.  Endo/Heme/Allergies:  Does not bruise/bleed easily.  Psychiatric/Behavioral:  Negative for depression. The patient is not nervous/anxious and does not have insomnia.      MEDICAL HISTORY:  Past Medical History:  Diagnosis Date   A-fib ([place])    Anemia    Arthritis    BACK   Bladder cancer ([place])    BPH (benign prostatic hyperplasia)    Cancer ([place])  skin cancer on face carcinoma   ED (erectile dysfunction)    Rosanna Randy disease    HLD (hyperlipidemia)    Hypertension    Macular  degeneration    Mollusca contagiosa    Osteopenia    Presence of permanent cardiac pacemaker    Sensorineural hearing loss    Squamous cell carcinoma of skin 08/25/2019   R cheek    Squamous cell carcinoma of skin 02/12/2020   L neck    Squamous cell carcinoma of skin 11/30/2020   Right elbow, [facility]   Squamous cell carcinoma of skin 03/16/2021   right base of thumb [facility]   Squamous cell carcinoma of skin 03/16/2021   right preauricular - [facility] 11/17/21   Squamous cell carcinoma of skin 10/05/2021   Right dorsum hand - [facility]   Squamous cell carcinoma of skin 11/22/2021   right inf preauricular anterior, [facility]   Squamous cell carcinoma of skin 11/22/2021   right inf preauricular posterior, [facility]    SURGICAL HISTORY: Past Surgical History:  Procedure Laterality Date   CATARACT EXTRACTION Bilateral    CYSTOSCOPY WITH BIOPSY N/A 01/20/2020   Procedure: CYSTOSCOPY WITH BIOPSY;  Surgeon: Abbie Sons, MD;  Location: [facility];  Service: Urology;  Laterality: N/A;   CYSTOSCOPY WITH FULGERATION N/A 01/20/2020   Procedure: CYSTOSCOPY WITH FULGERATION;  Surgeon: Abbie Sons, MD;  Location: [facility];  Service: Urology;  Laterality: N/A;   CYSTOSCOPY WITH FULGERATION N/A 04/19/2020   Procedure: [place] with clot evacuation;  Surgeon: Abbie Sons, MD;  Location: [facility];  Service: Urology;  Laterality: N/A;   CYSTOSCOPY WITH FULGERATION N/A 01/05/2021   Procedure: CYSTOSCOPY WITH FULGERATION;  Surgeon: Abbie Sons, MD;  Location: [facility];  Service: Urology;  Laterality: N/A;   HEMORRHOIDECTOMY WITH HEMORRHOID BANDING     KNEE ARTHROSCOPY Left    PACEMAKER INSERTION N/A 03/03/2015   Procedure: INSERTION DUAL LEAD PACEMAKER;  Surgeon: Isaias Cowman, MD;  Location: [facility];  Service: Cardiovascular;  Laterality: N/A;   PULSE GENERATOR IMPLANT N/A 10/18/2020   Procedure: UNILATERAL PULSE GENERATOR IMPLANT;  Surgeon: [name], MD;  Location: [facility];  Service:  Neurosurgery;  Laterality: N/A;   SPINAL CORD STIMULATOR TRIAL N/A 10/11/2020   Procedure: PERCUTANEOUS SPINAL CORD STIMULATOR TRIAL;  Surgeon: [name], MD;  Location: [facility];  Service: Neurosurgery;  Laterality: N/A;   THORACIC LAMINECTOMY FOR SPINAL CORD STIMULATOR N/A 10/18/2020   Procedure: PERCUTANEOUS LEAD PLACEMENT;  Surgeon: [name], MD;  Location: [facility];  Service: Neurosurgery;  Laterality: N/A;   TONSILLECTOMY     TRANSURETHRAL RESECTION OF BLADDER TUMOR N/A 10/07/2019   Procedure: TRANSURETHRAL RESECTION OF BLADDER TUMOR (TURBT);  Surgeon: Abbie Sons, MD;  Location: [facility];  Service: Urology;  Laterality: N/A;   TRANSURETHRAL RESECTION OF BLADDER TUMOR N/A 04/13/2020   Procedure: TRANSURETHRAL RESECTION OF BLADDER TUMOR (TURBT);  Surgeon: Abbie Sons, MD;  Location: [facility];  Service: Urology;  Laterality: N/A;    SOCIAL HISTORY: Social History   Socioeconomic History   Marital status: Widowed    Spouse name: Not on file   Number of children: Not on file   Years of education: Not on file   Highest education level: Not on file  Occupational History   Not on file  Tobacco Use   Smoking status: Never   Smokeless tobacco: Never  Vaping Use   Vaping Use: Never used  Substance and Sexual Activity   Alcohol use: Yes  Alcohol/week: 7.0 standard drinks of alcohol    Types: 7 Glasses of wine per week    Comment: DAILY    Drug use: No   Sexual activity: Yes    Birth control/protection: None  Other Topics Concern   Not on file  Social History Narrative   Retd. Civil engineer, contracting; [place]; self. Never smoked; alcohol- glass wine/drink every night.     Social Determinants of Health   Financial Resource Strain: Not on file  Food Insecurity: Not on file  Transportation Needs: Not on file  Physical Activity: Not on file  Stress: Not on file  Social Connections: Not on file  Intimate Partner Violence: Not on file    FAMILY HISTORY: Family  History  Problem Relation Age of Onset   Diabetes Mother    Lung cancer Father    Cancer Paternal Uncle    Prostate cancer Neg Hx    Bladder Cancer Neg Hx    Kidney cancer Neg Hx     ALLERGIES:  is allergic to atenolol.  MEDICATIONS:  Current Outpatient Medications  Medication Sig Dispense Refill   atorvastatin (LIPITOR) 10 MG tablet Take 10 mg by mouth in the morning.     calcium carbonate (OSCAL) 1500 (600 Ca) MG TABS tablet Take 600 mg of elemental calcium by mouth in the morning.     Cholecalciferol (VITAMIN D3) 50 MCG (2000 UT) TABS Take 2,000 Units by mouth in the morning.     dexamethasone (DECADRON) 2 MG tablet Take 1 tablet (2 mg total) by mouth daily. 7 tablet 0   doxycycline (VIBRAMYCIN) 100 MG capsule Take by mouth.     ELIQUIS 5 MG TABS tablet Take 1 tablet by mouth daily.     ferrous sulfate 325 (65 FE) MG tablet Take 325 mg by mouth in the morning and at bedtime.     finasteride (PROSCAR) 5 MG tablet TAKE 1 TABLET DAILY (Patient taking differently: Take 5 mg by mouth daily.) 90 tablet 3   HYDROcodone-acetaminophen (NORCO/VICODIN) 5-325 MG tablet Take 1-2 tablets by mouth every 4 (four) hours as needed for moderate pain. 60 tablet 0   mirtazapine (REMERON) 15 MG tablet Take 15 mg by mouth at bedtime.     Multiple Vitamin (MULTIVITAMIN WITH MINERALS) TABS tablet Take 1 tablet by mouth in the morning.     Multiple Vitamins-Minerals (PRESERVISION AREDS 2 PO) Take 1 tablet by mouth in the morning and at bedtime.     Omega-3 Fatty Acids (FISH OIL) 1000 MG CAPS Take 1,000 mg by mouth daily.     tamsulosin (FLOMAX) 0.4 MG CAPS capsule Take 0.4 mg by mouth daily.     diltiazem (CARDIZEM CD) 240 MG 24 hr capsule Take 1 capsule (240 mg total) by mouth daily. (Patient not taking: Reported on 04/27/2022) 30 capsule 0   metoprolol tartrate (LOPRESSOR) 50 MG tablet Take 1 tablet (50 mg total) by mouth 2 (two) times daily. (Patient taking differently: Take 50 mg by mouth 2 (two) times  daily. 2 in the morning and 2 qhs) 60 tablet 0   naloxone (NARCAN) nasal spray 4 mg/0.1 mL SPRAY 1 SPRAY INTO ONE NOSTRIL AS DIRECTED FOR OPIOID OVERDOSE (TURN PERSON ON SIDE AFTER DOSE. IF NO RESPONSE IN 2-3 MINUTES OR PERSON RESPONDS BUT RELAPSES, REPEAT USING A NEW SPRAY DEVICE AND SPRAY INTO THE OTHER NOSTRIL. CALL 911 AFTER USE.) * EMERGENCY USE ONLY * (Patient not taking: Reported on 05/11/2022) 1 each 0   No current facility-administered medications for  this visit.      Marland Kitchen  PHYSICAL EXAMINATION: ECOG PERFORMANCE STATUS: 0 - Asymptomatic  Vitals:   05/19/22 1114  BP: (!) 78/56  Pulse: 82  Temp: (!) 97.3 F (36.3 C)  SpO2: 96%     Filed Weights   05/19/22 1114  Weight: 153 lb 11.2 oz (69.7 kg)     Frail-appearing Caucasian male patient.   Physical Exam Constitutional:      General: He is not in acute distress.    Appearance: He is ill-appearing and toxic-appearing. He is not diaphoretic.  HENT:     Head: Normocephalic and atraumatic.     Mouth/Throat:     Pharynx: No oropharyngeal exudate.  Eyes:     Pupils: Pupils are equal, round, and reactive to light.  Cardiovascular:     Rate and Rhythm: Normal rate and regular rhythm.     Heart sounds: Normal heart sounds.  Pulmonary:     Effort: Pulmonary effort is normal. No respiratory distress.     Breath sounds: Normal breath sounds. No wheezing.  Abdominal:     General: Bowel sounds are normal. There is no distension.     Palpations: Abdomen is soft. There is no mass.     Tenderness: There is no abdominal tenderness. There is no guarding or rebound.  Musculoskeletal:        General: No tenderness. Normal range of motion.     Cervical back: Normal range of motion and neck supple.  Skin:    General: Skin is warm.     Comments: Deferred skin exam due to wrapping   Neurological:     Mental Status: He is alert and oriented to person, place, and time.  Psychiatric:        Mood and Affect: Affect normal.      LABORATORY DATA:  I have reviewed the data as listed Lab Results  Component Value Date   WBC 13.1 (H) 05/19/2022   HGB 12.1 (L) 05/19/2022   HCT 38.6 (L) 05/19/2022   MCV 95.8 05/19/2022   PLT 432 (H) 05/19/2022   Recent Labs    04/27/22 0835 05/11/22 1323 05/19/22 1058  NA 138 138 134*  K 4.7 4.1 4.1  CL 99 101 96*  CO2 '30 28 28  '$ GLUCOSE 111* 120* 110*  BUN '19 17 19  '$ CREATININE 0.94 0.89 0.67  CALCIUM 9.4 8.9 9.2  GFRNONAA >60 >60 >60  PROT 7.2 6.6 6.7  ALBUMIN 3.1* 2.7* 2.9*  AST 30 35 34  ALT 20 34 56*  ALKPHOS 99 105 126  BILITOT 0.6 0.6 0.6    RADIOGRAPHIC STUDIES: I have personally reviewed the radiological images as listed and agreed with the findings in the report. No results found.  ASSESSMENT & PLAN:  Encounter Diagnoses  Name Primary?   Cancer of overlapping sites of bladder (HCC)    Hypotension, unspecified hypotension type Yes   Leukocytosis, unspecified type    Skin infection     Patient originally here for treatment with keytruda/zometa/IVF/venofer. He does not appear well and meets sepsis criteria- hypotension- temp, active infection, confusion, pt age, leukocytosis- thought he has been on steroids.Concern for significant decline/death should he not be hospitalized at this time. Patient expresses concern over how poorly he feels and wishes to go to the ER as I have suggested. His daughter who attends the appointment with him also agrees. He is stable for transportation right to ER. ER triage nurse notified.    All questions were answered.  The patient knows to call the clinic with any problems, questions or concerns.    Hughie Closs, PA-C 05/19/2022 12:08 PM

## 2022-05-19 NOTE — Assessment & Plan Note (Addendum)
Potentially secondary to steroids as patient just finished a course of Decadron, however given hypotension, there is concern for underlying infection.  No source on examination.  Patient does have a stage II pressure ulcer, but no evidence of induration or purulent drainage.  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   --abx d/c'ed

## 2022-05-19 NOTE — Sepsis Progress Note (Signed)
eLink is following this Code Sepsis. °

## 2022-05-19 NOTE — Assessment & Plan Note (Signed)
Patient has a pressure ulcer on his buttocks that has been present for couple weeks after medication changes made him increasingly somnolent.  He has a scheduled appointment with the wound care center on 11/13.  No evidence of infection at this time. --wound care per order

## 2022-05-19 NOTE — Consult Note (Signed)
Pharmacy Antibiotic Note  Christopher [name] is a 88 y.o. male w/ h/o stage IV metastatic bladder cancer on Keytruda, Rosanna Randy disease, hypertension, hyperlipidemia, atrial fibrillation, who presents to the ED with hypotension. Pt admitted on 05/19/2022 with sepsis (unk origin).  Pharmacy has been consulted for vancomycin & cefepime dosing.  Plan: F/u MRSA PCR ordered & cultures.  Pt received Cefepime 2g IV x1 in ED; followed by Cefepime 2g IV q12h Pt received vancomycin 1g IV x1 in ED; followed by: Vancomycin '750mg'$  IV q12h (with stack dosing; starting at 2300) Goal AUC 400-600  Est AUC: 529; Cmax: 30; Cmin: 16 SCr 0.8 (rounded from 0.78); IBW; Vd 0.72  Height: '5\' 11"'$  (180.3 cm) Weight: 69.7 kg (153 lb 10.6 oz) IBW/kg (Calculated) : 75.3  Temp (24hrs), Avg:97.6 F (36.4 C), Min:97.1 F (36.2 C), Max:98.2 F (36.8 C)  Recent Labs  Lab 05/19/22 1058 05/19/22 1219 05/19/22 1514  WBC 13.1* 13.8*  --   CREATININE 0.67 0.78  --   LATICACIDVEN  --  2.3* 1.7    Estimated Creatinine Clearance: 58.1 mL/min (by C-G formula based on SCr of 0.78 mg/dL).    Allergies  Allergen Reactions   Atenolol     Happened a long time ago and can't recall    Antimicrobials this admission: VAN/CFP/MTZ (11/10 >>   Dose adjustments this admission: CTM and adjust PRN  Microbiology results: 11/10 BCx: pending 11/10 UCx: pending  11/10 Covid/FLu: negative  11/10 MRSA PCR: ordered  Thank you for allowing pharmacy to be a part of this patient's care.  Christopher [name] 05/19/2022 8:25 PM

## 2022-05-20 DIAGNOSIS — I952 Hypotension due to drugs: Secondary | ICD-10-CM | POA: Diagnosis not present

## 2022-05-20 LAB — COMPREHENSIVE METABOLIC PANEL
ALT: 41 U/L (ref 0–44)
AST: 31 U/L (ref 15–41)
Albumin: 2.2 g/dL — ABNORMAL LOW (ref 3.5–5.0)
Alkaline Phosphatase: 107 U/L (ref 38–126)
Anion gap: 4 — ABNORMAL LOW (ref 5–15)
BUN: 16 mg/dL (ref 8–23)
CO2: 29 mmol/L (ref 22–32)
Calcium: 8.3 mg/dL — ABNORMAL LOW (ref 8.9–10.3)
Chloride: 102 mmol/L (ref 98–111)
Creatinine, Ser: 0.59 mg/dL — ABNORMAL LOW (ref 0.61–1.24)
GFR, Estimated: >60 mL/min (ref >60)
Glucose, Bld: 91 mg/dL (ref 70–99)
Potassium: 4.1 mmol/L (ref 3.5–5.1)
Sodium: 135 mmol/L (ref 135–145)
Total Bilirubin: 0.7 mg/dL (ref 0.3–1.2)
Total Protein: 5.1 g/dL — ABNORMAL LOW (ref 6.5–8.1)

## 2022-05-20 LAB — SURGICAL PCR SCREEN
MRSA, PCR: NEGATIVE
Staphylococcus aureus: NEGATIVE

## 2022-05-20 LAB — CBC
HCT: 31 % — ABNORMAL LOW (ref 39.0–52.0)
Hemoglobin: 9.9 g/dL — ABNORMAL LOW (ref 13.0–17.0)
MCH: 29.6 pg (ref 26.0–34.0)
MCHC: 31.9 g/dL (ref 30.0–36.0)
MCV: 92.5 fL (ref 80.0–100.0)
Platelets: 323 10*3/uL (ref 150–400)
RBC: 3.35 MIL/uL — ABNORMAL LOW (ref 4.22–5.81)
RDW: 14.5 % (ref 11.5–15.5)
WBC: 9.6 10*3/uL (ref 4.0–10.5)
nRBC: 0 % (ref 0.0–0.2)

## 2022-05-20 LAB — PROCALCITONIN: Procalcitonin: <0.1 ng/mL

## 2022-05-20 MED ORDER — DILTIAZEM HCL 25 MG/5ML IV SOLN
10.0000 mg | Freq: Once | INTRAVENOUS | Status: AC | PRN
  Administered 2022-05-20: 10 mg via INTRAVENOUS

## 2022-05-20 MED ORDER — POLYETHYLENE GLYCOL 3350 17 G PO PACK
34.0000 g | PACK | Freq: Two times a day (BID) | ORAL | Status: DC
  Administered 2022-05-20 – 2022-05-21 (×2): 34 g via ORAL
  Filled 2022-05-20 (×2): qty 2

## 2022-05-20 MED ORDER — DILTIAZEM HCL 25 MG/5ML IV SOLN
10.0000 mg | Freq: Once | INTRAVENOUS | Status: AC
  Administered 2022-05-20: 10 mg via INTRAVENOUS
  Filled 2022-05-20: qty 5

## 2022-05-20 NOTE — Plan of Care (Signed)

## 2022-05-20 NOTE — Progress Notes (Addendum)
PROGRESS NOTE    Christopher [name]  POE:[id number] DOB: June 20, 1928 DOA: 05/19/2022 PCP: Christopher Ramsay, MD  [id number]/211A-AA  LOS: 0 days   Brief hospital course:   Assessment & Plan: Christopher [name] is a 88 y.o. male with medical history significant of stage IV metastatic bladder cancer on Keytruda, Gilbert disease, hypertension, hyperlipidemia, atrial fibrillation, who presents to the ED with hypotension.   Christopher [name] presented to his oncologist's office today for his planned infusion of Keytruda.  Vital signs obtained at that time demonstrated blood pressure of 78/56 with heart rate of 82.    * Hypotension Patient presented with significant hypotension at his oncologist's office this morning that has subsequently improved since arriving to the ER.  He is actually on the hypertensive side after receiving fluids.  Given he also has leukocytosis while on immunomodulatory therapy, there is concern for underlying etiology is infection, pt was therefore started in empiric abx on presentation. --hypotension may be due to too much BP medication at home Plan: --cont Lopressor --Hold home Cardizem --d/c abx and monitor  Leukocytosis Potentially secondary to steroids as patient just finished a course of Decadron, however given hypotension, there is concern for underlying infection.  No source on examination.  Patient does have a stage II pressure ulcer, but no evidence of induration or purulent drainage.  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   --d/c abx and monitor  Atrial flutter with rapid ventricular response (HCC) with variable conduction.  He is asymptomatic and rate is controlled.  He is already anticoagulated. --cont home Lopressor - Continue home Eliquis  Addendum: Around 6:30 pm, after pt worked with PT, his HR went up to 150's, which didn't go down with rest.  EKG showed Aflutter 2:1 with rate of 152.  Pt was  asymptomatic, and BP acceptable.  Plan: --IV dilt 10 mg x1, if rate not controlled after 20 min, give a 2nd dose of 10 mg.  If rate not controlled after 2nd dose, then transfer to PCU for IV dilt gtt.  Pressure ulcer of coccygeal region, stage 2 ([place]) Patient has a pressure ulcer on his buttocks that has been present for couple weeks after medication changes made him increasingly somnolent.  He has a scheduled appointment with the wound care center on 11/13.  No evidence of infection at this time.  - Wound care consult placed  Urothelial carcinoma of bladder ([place]) Stage IV metastatic urothelial carcinoma of the bladder with current metastasis involving lymph nodes and bone.  He is undergoing immunotherapy with Keytruda after being deemed a poor candidate for chemotherapy.  Sepsis, ruled out   DVT prophylaxis: [id number] Code Status: DNR  Family Communication: daughters updated at bedside and on video call Level of care: Telemetry Medical Dispo:   The patient is from: home Anticipated d/c is to: home Anticipated d/c date is: tomorrow   Subjective and Interval History:  Pt denied pain.   Objective: Vitals:   05/20/22 0717 05/20/22 1641 05/20/22 1726 05/20/22 1757  BP: (!) 140/72 132/67  (!) 138/93  Pulse: 65 75 (!) 135 (!) 152  Resp: 15   18  Temp: 98.3 F (36.8 C) 98.2 F (36.8 C)  99.4 F (37.4 C)  TempSrc: Oral Oral  Oral  SpO2: 97% 94%  95%  Weight:      Height:        Intake/Output Summary (Last 24 hours) at 05/20/2022 1837 Last data  filed at 05/20/2022 1012 Gross per 24 hour  Intake 2222.5 ml  Output 950 ml  Net 1272.5 ml   Filed Weights   05/19/22 1212  Weight: 69.7 kg    Examination:   Constitutional: NAD, AAOx3 HEENT: conjunctivae and lids normal, EOMI CV: No cyanosis.   RESP: normal respiratory effort, on RA Neuro: II - XII grossly intact.   Psych: Normal mood and affect.  Appropriate judgement and reason   Data Reviewed: I have personally  reviewed labs and imaging studies  Time spent: 50 minutes  Enzo Bi, MD [facility] If 7PM-7AM, please contact night-coverage 05/20/2022, 6:37 PM

## 2022-05-20 NOTE — Evaluation (Signed)
Physical Therapy Evaluation Patient Details Name: Christopher [name] MRN: [id number] DOB: 03-10-1928 Today's Date: 05/20/2022  History of Present Illness  Christopher [name] is a 88 y.o. male with medical history significant of stage IV metastatic bladder cancer on Keytruda, Gilbert disease, hypertension, hyperlipidemia, atrial fibrillation, who presents to the ED with hypotension and leukocytosis.  Clinical Impression  Pt presents A & O x 4 sitting in bed and agreeable to PT. He shows significant decline from baseline with decreased mobility requiring max A to reach edge of bed with mod VC and TC for patient to sequence movement and hand placement prior to assisting patient. Session terminated due to hypertensive episode with HR >130 with bed mobility. Pt states he has assistance at home, but he will require significantly more assistance with transfers that was not required before. PT recommending pt receive additional skilled PT to address aforementioned deficits for pt to return home safely and to decrease caregiver burden.      Recommendations for follow up therapy are one component of a multi-disciplinary discharge planning process, led by the attending physician.  Recommendations may be updated based on patient status, additional functional criteria and insurance authorization.  Follow Up Recommendations Skilled nursing-short term rehab (<3 hours/day) Can patient physically be transported by private vehicle: No    Assistance Recommended at Discharge Frequent or constant Supervision/Assistance  Patient can return home with the following  Two people to help with bathing/dressing/bathroom;Two people to help with walking and/or transfers    Equipment Recommendations BSC/3in1  Recommendations for Other Services  OT consult    Functional Status Assessment Patient has had a recent decline in their functional status and/or demonstrates limited ability to make significant improvements in  function in a reasonable and predictable amount of time     Precautions / Restrictions Precautions Precautions: Fall Restrictions Weight Bearing Restrictions: No      Mobility  Bed Mobility Overal bed mobility: Needs Assistance Bed Mobility: Supine to Sit     Supine to sit: Max assist, HOB elevated     General bed mobility comments: Unable to push himself up from sidelying to sit requring max A from PT. Terminated session due to tachycardia    Transfers                        Ambulation/Gait                  Stairs            Wheelchair Mobility    Modified Rankin (Stroke Patients Only)       Balance Overall balance assessment: Needs assistance Sitting-balance support: Feet supported, Single extremity supported Sitting balance-Leahy Scale: Poor Sitting balance - Comments: Needs mod VC for hand placement on railing and mod A to achieve static sitting balance. He only requires CGA when in static sitting position Postural control: Posterior lean                                   Pertinent Vitals/Pain Pain Assessment Pain Assessment: No/denies pain    Home Living Family/patient expects to be discharged to:: Private residence Living Arrangements: Children Available Help at Discharge: Family Type of Home: House Home Access: Stairs to enter Entrance Stairs-Rails: None Entrance Stairs-Number of Steps: 1   Home Layout: One level Home Equipment: Conservation officer, nature (2 wheels)      Prior Function Prior Level  of Function : Independent/Modified Independent             Mobility Comments: Very limited mobility. Walks to and from room to bathroom. Able to get up from bed independently and has a regular bed. When going to doctor's appointments uses transport chair       Hand Dominance        Extremity/Trunk Assessment   Upper Extremity Assessment Upper Extremity Assessment: Defer to OT evaluation    Lower Extremity  Assessment Lower Extremity Assessment: Generalized weakness    Cervical / Trunk Assessment Cervical / Trunk Assessment: Kyphotic  Communication   Communication: No difficulties  Cognition Arousal/Alertness: Awake/alert Behavior During Therapy: WFL for tasks assessed/performed Overall Cognitive Status: Within Functional Limits for tasks assessed                                 General Comments: HOH        General Comments      Exercises     Assessment/Plan    PT Assessment Patient needs continued PT services  PT Problem List Decreased strength;Decreased activity tolerance;Cardiopulmonary status limiting activity;Decreased skin integrity       PT Treatment Interventions      PT Goals (Current goals can be found in the Care Plan section)  Acute Rehab PT Goals Patient Stated Goal: Go home safely PT Goal Formulation: With patient Time For Goal Achievement: 06/03/22 Potential to Achieve Goals: Fair    Frequency Min 2X/week     Co-evaluation               AM-PAC PT "6 Clicks" Mobility  Outcome Measure Help needed turning from your back to your side while in a flat bed without using bedrails?: A Lot Help needed moving from lying on your back to sitting on the side of a flat bed without using bedrails?: A Lot Help needed moving to and from a bed to a chair (including a wheelchair)?: Total Help needed standing up from a chair using your arms (e.g., wheelchair or bedside chair)?: Total Help needed to walk in hospital room?: Total Help needed climbing 3-5 steps with a railing? : Total 6 Click Score: 8    End of Session Equipment Utilized During Treatment: Gait belt Activity Tolerance: Treatment limited secondary to medical complications (Comment) Patient left: in bed Nurse Communication: Mobility status PT Visit Diagnosis: Muscle weakness (generalized) (M62.81);Difficulty in walking, not elsewhere classified (R26.2)    Time: 8341-9622 PT Time  Calculation (min) (ACUTE ONLY): 35 min   Charges:   PT Evaluation $PT Eval Low Complexity: 1 Low PT Treatments $Therapeutic Activity: 8-22 mins       Bradly Chris PT, DPT  05/20/2022, 5:36 PM

## 2022-05-21 DIAGNOSIS — Z9842 Cataract extraction status, left eye: Secondary | ICD-10-CM | POA: Diagnosis not present

## 2022-05-21 DIAGNOSIS — Z7901 Long term (current) use of anticoagulants: Secondary | ICD-10-CM | POA: Diagnosis not present

## 2022-05-21 DIAGNOSIS — E871 Hypo-osmolality and hyponatremia: Secondary | ICD-10-CM | POA: Diagnosis present

## 2022-05-21 DIAGNOSIS — C679 Malignant neoplasm of bladder, unspecified: Secondary | ICD-10-CM | POA: Diagnosis present

## 2022-05-21 DIAGNOSIS — I495 Sick sinus syndrome: Secondary | ICD-10-CM | POA: Diagnosis present

## 2022-05-21 DIAGNOSIS — R6510 Systemic inflammatory response syndrome (SIRS) of non-infectious origin without acute organ dysfunction: Secondary | ICD-10-CM | POA: Diagnosis present

## 2022-05-21 DIAGNOSIS — N400 Enlarged prostate without lower urinary tract symptoms: Secondary | ICD-10-CM | POA: Diagnosis present

## 2022-05-21 DIAGNOSIS — Z1152 Encounter for screening for COVID-19: Secondary | ICD-10-CM | POA: Diagnosis not present

## 2022-05-21 DIAGNOSIS — E440 Moderate protein-calorie malnutrition: Secondary | ICD-10-CM | POA: Diagnosis present

## 2022-05-21 DIAGNOSIS — R000 Tachycardia, unspecified: Secondary | ICD-10-CM | POA: Diagnosis present

## 2022-05-21 DIAGNOSIS — I10 Essential (primary) hypertension: Secondary | ICD-10-CM | POA: Diagnosis present

## 2022-05-21 DIAGNOSIS — Z85828 Personal history of other malignant neoplasm of skin: Secondary | ICD-10-CM | POA: Diagnosis not present

## 2022-05-21 DIAGNOSIS — Z888 Allergy status to other drugs, medicaments and biological substances status: Secondary | ICD-10-CM | POA: Diagnosis not present

## 2022-05-21 DIAGNOSIS — Z6821 Body mass index (BMI) 21.0-21.9, adult: Secondary | ICD-10-CM | POA: Diagnosis not present

## 2022-05-21 DIAGNOSIS — E785 Hyperlipidemia, unspecified: Secondary | ICD-10-CM | POA: Diagnosis present

## 2022-05-21 DIAGNOSIS — Z8551 Personal history of malignant neoplasm of bladder: Secondary | ICD-10-CM | POA: Diagnosis not present

## 2022-05-21 DIAGNOSIS — L89152 Pressure ulcer of sacral region, stage 2: Secondary | ICD-10-CM | POA: Diagnosis present

## 2022-05-21 DIAGNOSIS — C779 Secondary and unspecified malignant neoplasm of lymph node, unspecified: Secondary | ICD-10-CM | POA: Diagnosis present

## 2022-05-21 DIAGNOSIS — I959 Hypotension, unspecified: Secondary | ICD-10-CM | POA: Diagnosis present

## 2022-05-21 DIAGNOSIS — I480 Paroxysmal atrial fibrillation: Secondary | ICD-10-CM | POA: Diagnosis present

## 2022-05-21 DIAGNOSIS — L89322 Pressure ulcer of left buttock, stage 2: Secondary | ICD-10-CM | POA: Diagnosis present

## 2022-05-21 DIAGNOSIS — Z66 Do not resuscitate: Secondary | ICD-10-CM | POA: Diagnosis present

## 2022-05-21 DIAGNOSIS — Z79899 Other long term (current) drug therapy: Secondary | ICD-10-CM | POA: Diagnosis not present

## 2022-05-21 DIAGNOSIS — I4892 Unspecified atrial flutter: Secondary | ICD-10-CM | POA: Diagnosis present

## 2022-05-21 LAB — CBC
HCT: 32.9 % — ABNORMAL LOW (ref 39.0–52.0)
Hemoglobin: 10.7 g/dL — ABNORMAL LOW (ref 13.0–17.0)
MCH: 29.8 pg (ref 26.0–34.0)
MCHC: 32.5 g/dL (ref 30.0–36.0)
MCV: 91.6 fL (ref 80.0–100.0)
Platelets: 333 10*3/uL (ref 150–400)
RBC: 3.59 MIL/uL — ABNORMAL LOW (ref 4.22–5.81)
RDW: 14.6 % (ref 11.5–15.5)
WBC: 8.7 10*3/uL (ref 4.0–10.5)
nRBC: 0 % (ref 0.0–0.2)

## 2022-05-21 LAB — MAGNESIUM: Magnesium: 2 mg/dL (ref 1.7–2.4)

## 2022-05-21 LAB — BASIC METABOLIC PANEL
Anion gap: 4 — ABNORMAL LOW (ref 5–15)
BUN: 13 mg/dL (ref 8–23)
CO2: 27 mmol/L (ref 22–32)
Calcium: 8.1 mg/dL — ABNORMAL LOW (ref 8.9–10.3)
Chloride: 101 mmol/L (ref 98–111)
Creatinine, Ser: 0.56 mg/dL — ABNORMAL LOW (ref 0.61–1.24)
GFR, Estimated: >60 mL/min (ref >60)
Glucose, Bld: 89 mg/dL (ref 70–99)
Potassium: 3.9 mmol/L (ref 3.5–5.1)
Sodium: 132 mmol/L — ABNORMAL LOW (ref 135–145)

## 2022-05-21 LAB — URINE CULTURE: Culture: NO GROWTH

## 2022-05-21 MED ORDER — DILTIAZEM HCL 25 MG/5ML IV SOLN: 25.0000 mg | Freq: Once | INTRAVENOUS | Status: DC

## 2022-05-21 MED ORDER — DILTIAZEM HCL 25 MG/5ML IV SOLN
20.0000 mg | Freq: Once | INTRAVENOUS | Status: AC
  Administered 2022-05-21: 20 mg via INTRAVENOUS
  Filled 2022-05-21: qty 5

## 2022-05-21 MED ORDER — DILTIAZEM HCL ER COATED BEADS 180 MG PO CP24
180.0000 mg | ORAL_CAPSULE | Freq: Every day | ORAL | Status: DC
  Administered 2022-05-21 – 2022-05-23 (×3): 180 mg via ORAL
  Filled 2022-05-21 (×3): qty 1

## 2022-05-21 MED ORDER — DILTIAZEM HCL 25 MG/5ML IV SOLN
15.0000 mg | Freq: Once | INTRAVENOUS | Status: AC
  Administered 2022-05-21: 15 mg via INTRAVENOUS
  Filled 2022-05-21: qty 5

## 2022-05-21 MED ORDER — DILTIAZEM HCL-DEXTROSE 125-5 MG/125ML-% IV SOLN (PREMIX)
5.0000 mg/h | INTRAVENOUS | Status: DC
  Administered 2022-05-21 – 2022-05-22 (×3): 5 mg/h via INTRAVENOUS
  Filled 2022-05-21 (×2): qty 125

## 2022-05-21 MED ORDER — DILTIAZEM HCL ER COATED BEADS 120 MG PO CP24: 240.0000 mg | ORAL_CAPSULE | Freq: Every day | ORAL | Status: DC

## 2022-05-21 MED ORDER — METOPROLOL TARTRATE 5 MG/5ML IV SOLN
10.0000 mg | Freq: Once | INTRAVENOUS | Status: AC
  Administered 2022-05-21: 10 mg via INTRAVENOUS
  Filled 2022-05-21: qty 10

## 2022-05-21 NOTE — NC FL2 (Signed)
[place] LEVEL OF CARE SCREENING TOOL     IDENTIFICATION  Patient Name: Christopher [name] Birthdate: [birth year] Sex: male Admission Date (Current Location): 05/19/2022  [facility] [place] Number:  Engineering geologist and Address:  [facility], [address], [place], [place] [address]      Provider Number: [id number]  Attending Physician Name and Address:  Enzo Bi, MD  Relative Name and Phone Number:  Burna Sis [phone]    Current Level of Care: Hospital Recommended Level of Care: [place] Prior Approval Number:    Date Approved/Denied:   PASRR Number: [id number]  Discharge Plan: SNF    Current Diagnoses: Patient Active Problem List   Diagnosis Date Noted   Hypotension 05/19/2022   Leukocytosis 05/19/2022   Atrial flutter with rapid ventricular response ([place]) 05/19/2022   Pressure ulcer of coccygeal region, stage 2 ([place]) 05/19/2022   Cancer of overlapping sites of bladder ([place]) 02/17/2022   Generalized weakness    Delirium 01/08/2021   Acute renal failure (ARF) ([place]) 01/05/2021   Acute hypoxemic respiratory failure ([place]) 01/05/2021   Gross hematuria 01/03/2021   Acute blood loss anemia 01/03/2021   Iron deficiency anemia due to chronic blood loss 12/18/2019   Urothelial carcinoma of bladder ([place]) 10/29/2019   Chronic anticoagulation 07/15/2019   Lesion of peritoneum 06/11/2019   Imbalance 01/29/2019   Lumbar degenerative disc disease 12/04/2018   Primary osteoarthritis of left knee 04/11/2018   Persistent atrial fibrillation ([place]) 10/11/2016   Chronic right-sided low back pain without sciatica 06/14/2015   Dizziness 03/10/2015   Symptomatic sinus bradycardia 03/10/2015   Syncope and collapse 02/28/2015   Symptomatic bradycardia 02/28/2015   HTN (hypertension) 02/28/2015   Hypercholesteremia 02/28/2015   Benign prostatic hyperplasia with urinary frequency 02/28/2015   Alcohol use  02/28/2015   Other specified health status 02/28/2015   Asymptomatic proteinuria 01/04/2015   Chronic leukopenia 11/14/2013   Glucose intolerance (impaired glucose tolerance) 11/14/2013   High risk medication use 11/14/2013   Dupuytren contracture 09/29/2013   ED (erectile dysfunction) 09/29/2013   Rosanna Randy syndrome 09/29/2013   Macular degeneration 09/29/2013   Osteopenia 09/29/2013   SNHL (sensorineural hearing loss) 09/29/2013    Orientation RESPIRATION BLADDER Height & Weight     Self, Time, Place  Normal Incontinent Weight: 69.7 kg Height:  '5\' 11"'$  (180.3 cm)  BEHAVIORAL SYMPTOMS/MOOD NEUROLOGICAL BOWEL NUTRITION STATUS      Continent Diet  AMBULATORY STATUS COMMUNICATION OF NEEDS Skin   Limited Assist Verbally Normal                       Personal Care Assistance Level of Assistance  Bathing, Dressing Bathing Assistance: Limited assistance   Dressing Assistance: Limited assistance     Functional Limitations Info             SPECIAL CARE FACTORS FREQUENCY  PT (By licensed PT), OT (By licensed OT)     PT Frequency: 5 times a week OT Frequency: 5 times a week            Contractures Contractures Info: Not present    Additional Factors Info  Code Status, Allergies Code Status Info: DNR Allergies Info: Atenolol           Current Medications (05/21/2022):  This is the current hospital active medication list Current Facility-Administered Medications  Medication Dose Route Frequency Provider Last Rate Last Admin   apixaban (ELIQUIS) tablet 5 mg  5 mg  Oral BID Jose Persia, MD   5 mg at 05/21/22 1102   atorvastatin (LIPITOR) tablet 10 mg  10 mg Oral q AM Jose Persia, MD   10 mg at 05/21/22 1102   calcium carbonate (OS-CAL - dosed in mg of elemental calcium) tablet 1,250 mg  500 mg of elemental calcium Oral q AM Jose Persia, MD   1,250 mg at 05/21/22 1102   cholecalciferol (VITAMIN D3) 25 MCG (1000 UNIT) tablet 2,000 Units  2,000 Units Oral  q AM Jose Persia, MD   2,000 Units at 05/21/22 1101   diltiazem (CARDIZEM CD) 24 hr capsule 180 mg  180 mg Oral Daily Enzo Bi, MD   180 mg at 05/21/22 1111   diltiazem (CARDIZEM) 125 mg in dextrose 5% 125 mL (1 mg/mL) infusion  5-15 mg/hr Intravenous Titrated Sharion Settler, NP 10 mL/hr at 05/21/22 1144 10 mg/hr at 05/21/22 1144   HYDROcodone-acetaminophen (NORCO/VICODIN) 5-325 MG per tablet 1 tablet  1 tablet Oral Q6H PRN Jose Persia, MD   1 tablet at 05/20/22 0321   ibuprofen (ADVIL) tablet 400 mg  400 mg Oral Q6H PRN Beers, Shanon Brow, RPH       metoprolol tartrate (LOPRESSOR) tablet 25 mg  25 mg Oral BID Jose Persia, MD   25 mg at 05/21/22 1101   multivitamin with minerals tablet 1 tablet  1 tablet Oral Daily Jose Persia, MD   1 tablet at 05/21/22 1111   ondansetron (ZOFRAN) tablet 4 mg  4 mg Oral Q6H PRN Jose Persia, MD       Or   ondansetron (ZOFRAN) injection 4 mg  4 mg Intravenous Q6H PRN Jose Persia, MD       polyethylene glycol (MIRALAX / GLYCOLAX) packet 34 g  34 g Oral BID Enzo Bi, MD   34 g at 05/21/22 1100   sodium chloride flush (NS) 0.9 % injection 3 mL  3 mL Intravenous Q12H Jose Persia, MD   3 mL at 05/21/22 1102   tamsulosin (FLOMAX) capsule 0.4 mg  0.4 mg Oral Daily Jose Persia, MD   0.4 mg at 05/21/22 1102     Discharge Medications: Please see discharge summary for a list of discharge medications.  Relevant Imaging Results:  Relevant Lab Results:   Additional Information SSN:[id number]  Valente David, RN

## 2022-05-21 NOTE — Progress Notes (Signed)
PROGRESS NOTE    Christopher [name]  KDX:[id number] DOB: Oct 14, 1927 DOA: 05/19/2022 PCP: [name], MD  240A/240A-BB  LOS: 0 days   Brief hospital course:   Assessment & Plan: Christopher [name] is a 88 y.o. male with medical history significant of stage IV metastatic bladder cancer on Keytruda, Gilbert disease, hypertension, hyperlipidemia, atrial fibrillation, who presents to the ED with hypotension.   Christopher [name] presented to his oncologist's office today for his planned infusion of Keytruda.  Vital signs obtained at that time demonstrated blood pressure of 78/56 with heart rate of 82.    * Hypotension Patient presented with significant hypotension at his oncologist's office this morning that has subsequently improved since arriving to the ER.  He is actually on the hypertensive side after receiving fluids.  Given he also has leukocytosis while on immunomodulatory therapy, there is concern for underlying etiology is infection, pt was therefore started in empiric abx on presentation.  Abx d/c'ed. --hypotension may be due to too much BP medication at home, however, metop and cardizem needed for rate control. Plan: --monitor BP while on titrating up rate control agents  Leukocytosis Potentially secondary to steroids as patient just finished a course of Decadron, however given hypotension, there is concern for underlying infection.  No source on examination.  Patient does have a stage II pressure ulcer, but no evidence of induration or purulent drainage.  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   --abx d/c'ed  Atrial flutter with rapid ventricular response ([place]) --on presentation, pt was in Aflutter but rate controlled.   --Around 6:30 pm on 11/11, pt went into Aflutter 2:1 with rate of 152.  Pt was asymptomatic.  Rate uncontrolled despite multiple doses of IV dilt, so pt transferred to PCU for dilt gtt Plan: --cont  dilt gtt until rate controlled (<110's) --cont home lopressor 25 mg BID --resume home cardizem at reduced 180 mg daily --cardiology consult today  Pressure ulcer of coccygeal region, stage 2 ([place]) Patient has a pressure ulcer on his buttocks that has been present for couple weeks after medication changes made him increasingly somnolent.  He has a scheduled appointment with the wound care center on 11/13.  No evidence of infection at this time.  - Wound care consult placed  Urothelial carcinoma of bladder ([place]) Stage IV metastatic urothelial carcinoma of the bladder with current metastasis involving lymph nodes and bone.  He is undergoing immunotherapy with Keytruda after being deemed a poor candidate for chemotherapy.  Sepsis, ruled out   DVT prophylaxis: [id number] Code Status: DNR  Family Communication: daughters updated at bedside and on video call Level of care: Progressive Dispo:   The patient is from: home Anticipated d/c is to: SNF rehab Anticipated d/c date is: 1-2 days   Subjective and Interval History:  Pt went into Aflutter w RVR yesterday evening.  Uncontrolled after multiple doses if IV dilt, so was transferred to PCU for dilt gtt.  Pt remained asymptomatic.      Objective: Vitals:   05/21/22 1600 05/21/22 1615 05/21/22 1630 05/21/22 1633  BP: 111/62 107/65 106/61 106/61  Pulse: 75 74 75 75  Resp: (!) '21 18 17 19  '$ Temp:      TempSrc:      SpO2: 94% 94% 94% 95%  Weight:      Height:        Intake/Output Summary (Last 24 hours) at 05/21/2022 1646 Last data filed  at 05/21/2022 1434 Gross per 24 hour  Intake 83.87 ml  Output 800 ml  Net -716.13 ml   Filed Weights   05/19/22 1212  Weight: 69.7 kg    Examination:   Constitutional: NAD, AAOx3 HEENT: conjunctivae and lids normal, EOMI CV: No cyanosis.   RESP: normal respiratory effort, on RA Neuro: II - XII grossly intact.   Psych: Normal mood and affect.  Appropriate judgement and reason   Data  Reviewed: I have personally reviewed labs and imaging studies  Time spent: 50 minutes  Enzo Bi, MD [facility] If 7PM-7AM, please contact night-coverage 05/21/2022, 4:46 PM

## 2022-05-21 NOTE — Progress Notes (Signed)
   05/20/22 1946  Assess: MEWS Score  Temp 99 F (37.2 C)  BP 115/71  MAP (mmHg) 83  Pulse Rate (!) 151  Level of Consciousness Alert  SpO2 96 %  O2 Device Room Air  Assess: MEWS Score  MEWS Temp 0  MEWS Systolic 0  MEWS Pulse 3  MEWS RR 0  MEWS LOC 0  MEWS Score 3  MEWS Score Color Yellow  Assess: if the MEWS score is Yellow or Red  Were vital signs taken at a resting state? Yes  Focused Assessment No change from prior assessment  Does the patient meet 2 or more of the SIRS criteria? No  Does the patient have a confirmed or suspected source of infection? No  MEWS guidelines implemented *See Row Information* Yes  Treat  Pain Scale 0-10  Pain Score 0  Take Vital Signs  Increase Vital Sign Frequency  Yellow: Q 2hr X 2 then Q 4hr X 2, if remains yellow, continue Q 4hrs  Notify: Provider  Provider Name/Title Sharion Settler  Date Provider Notified 05/20/22  Time Provider Notified 1946  Method of Notification Page  Notification Reason Change in status  Provider response See new orders  Date of Provider Response 05/20/22  Time of Provider Response 1950  Document  Patient Outcome Not stable and remains on department  Progress note created (see row info) Yes  Assess: SIRS CRITERIA  SIRS Temperature  0  SIRS Pulse 1  SIRS Respirations  0  SIRS WBC 1  SIRS Score Sum  2

## 2022-05-21 NOTE — TOC Initial Note (Signed)
Transition of Care [facility]) - Initial/Assessment Note    Patient Details  Name: Christopher [name] MRN: [id number] Date of Birth: 1927/10/07  Transition of Care [facility]) CM/SW Contact:    Valente David, RN Phone Number: 05/21/2022, 2:29 PM  Clinical Narrative:                  Patient live with daughter Magda Paganini.  PCP is Dr. Ola Spurr, [facility] [place]  Uses transport chair and walker at home, has built in shower seat.  Magda Paganini aware of recommendations for SNF for short term rehab, agreeable to start bed search.  FL2 completed, daughter's preferred choice is [facility], but she has agreed to send information to [facility], [facility], and [facility].  Will send daughter link to explore other options for rehab and keep her updated on bed offers.   Expected Discharge Plan: Skilled Nursing Facility Barriers to Discharge: Continued Medical Work up   Patient Goals and CMS Choice Patient states their goals for this hospitalization and ongoing recovery are:: SNF for short term rehab CMS Medicare.gov Compare Post Acute Care list provided to:: Patient Represenative (must comment) Burna Sis) Choice offered to / list presented to : Adult Children  Expected Discharge Plan and Services Expected Discharge Plan: [place] Acute Care Choice: [place] Living arrangements for the past 2 months: Single Family Home                                      Prior Living Arrangements/Services Living arrangements for the past 2 months: Single Family Home Lives with:: Adult Children Patient language and need for interpreter reviewed:: Yes Do you feel safe going back to the place where you live?: Yes      Need for Family Participation in Patient Care: Yes (Comment) Care giver support system in place?: Yes (comment) Current home services: DME Criminal Activity/Legal Involvement Pertinent to Current Situation/Hospitalization: No - Comment as  needed  Activities of Daily Living Home Assistive Devices/Equipment: Wheelchair, Environmental consultant (specify type) ADL Screening (condition at time of admission) Patient's cognitive ability adequate to safely complete daily activities?: Yes Is the patient deaf or have difficulty hearing?: No Does the patient have difficulty seeing, even when wearing glasses/contacts?: No Does the patient have difficulty concentrating, remembering, or making decisions?: No Patient able to express need for assistance with ADLs?: Yes Does the patient have difficulty dressing or bathing?: Yes Independently performs ADLs?: No Communication: Independent Dressing (OT): Needs assistance Is this a change from baseline?: Pre-admission baseline Grooming: Needs assistance Is this a change from baseline?: Pre-admission baseline Feeding: Independent Bathing: Needs assistance Is this a change from baseline?: Pre-admission baseline In/Out Bed: Needs assistance Is this a change from baseline?: Pre-admission baseline Walks in Home: Needs assistance Is this a change from baseline?: Pre-admission baseline Does the patient have difficulty walking or climbing stairs?: Yes Weakness of Legs: Both Weakness of Arms/Hands: None  Permission Sought/Granted   Permission granted to share information with : Yes, Verbal Permission Granted  Share Information with NAME: Magda Paganini  Permission granted to share info w AGENCY: SNF rehabs  Permission granted to share info w Relationship: Daughter  Permission granted to share info w Contact Information: [phone]  Emotional Assessment           Psych Involvement: No (comment)  Admission diagnosis:  SIRS (systemic inflammatory response syndrome) ([place]) [R65.10] Sepsis,  due to unspecified organism, unspecified whether acute organ dysfunction present [facility]) [A41.9] Atrial flutter with rapid ventricular response ([place]) [I48.92] Patient Active Problem List   Diagnosis Date Noted   Hypotension  05/19/2022   Leukocytosis 05/19/2022   Atrial flutter with rapid ventricular response ([place]) 05/19/2022   Pressure ulcer of coccygeal region, stage 2 ([place]) 05/19/2022   Cancer of overlapping sites of bladder ([place]) 02/17/2022   Generalized weakness    Delirium 01/08/2021   Acute renal failure (ARF) ([place]) 01/05/2021   Acute hypoxemic respiratory failure ([place]) 01/05/2021   Gross hematuria 01/03/2021   Acute blood loss anemia 01/03/2021   Iron deficiency anemia due to chronic blood loss 12/18/2019   Urothelial carcinoma of bladder ([place]) 10/29/2019   Chronic anticoagulation 07/15/2019   Lesion of peritoneum 06/11/2019   Imbalance 01/29/2019   Lumbar degenerative disc disease 12/04/2018   Primary osteoarthritis of left knee 04/11/2018   Persistent atrial fibrillation ([place]) 10/11/2016   Chronic right-sided low back pain without sciatica 06/14/2015   Dizziness 03/10/2015   Symptomatic sinus bradycardia 03/10/2015   Syncope and collapse 02/28/2015   Symptomatic bradycardia 02/28/2015   HTN (hypertension) 02/28/2015   Hypercholesteremia 02/28/2015   Benign prostatic hyperplasia with urinary frequency 02/28/2015   Alcohol use 02/28/2015   Other specified health status 02/28/2015   Asymptomatic proteinuria 01/04/2015   Chronic leukopenia 11/14/2013   Glucose intolerance (impaired glucose tolerance) 11/14/2013   High risk medication use 11/14/2013   Dupuytren contracture 09/29/2013   ED (erectile dysfunction) 09/29/2013   Rosanna Randy syndrome 09/29/2013   Macular degeneration 09/29/2013   Osteopenia 09/29/2013   SNHL (sensorineural hearing loss) 09/29/2013   PCP:  [name], MD Pharmacy:   [facility] [place] [place] [facility] [address] [employer] [address] [place] [address] Phone: [phone] Fax: [phone]  [id number] [place] [id number]:  [phone]: [phone]    Social Determinants of Health (SDOH) Interventions    Readmission Risk Interventions     No data to display

## 2022-05-21 NOTE — Consult Note (Signed)
[facility] Cardiology  CARDIOLOGY CONSULT NOTE  Patient ID: Christopher [name] MRN: [id number] DOB/AGE: 1928-04-19 88 y.o.  Admit date: 05/19/2022 Referring Physician [name] Primary Physician [facility] Primary Cardiologist Fath / Junell Cullifer Reason for Consultation atrial fibrillation/atrial flutter with rapid ventricular rate  HPI: 88 year old gentleman referred for evaluation of atrial fibrillation/atrial flutter with rapid ventricular rate.  Patient has known history of metastatic stage IV bladder cancer.  He presented to oncology office on 05/19/2022 for and infusion OT Trotta at which time he was noted to be hypotensive.  Heart rate was 82 bpm.  Patient was sent to [facility] ED where he was noted to have elevated white count with stage II pressure ulcer.  Patient was started on intravenous biotics and was admitted for further evaluation and management.  To low blood pressure, Cardizem CD was held.  The patient has had intermittent episodes of atrial flutter with rapid ventricular rate with 2-1 conduction telemetry.  Patient appears to be asymptomatic, denies chest pain, shortness of breath, or patient's.  The patient was started on diltiazem drip, currently is in atrial fibrillation at a rate of 80 bpm.  Patient has known history of sick sinus syndrome, status post dual-chamber pacemaker.  Review of systems complete and found to be negative unless listed above     Past Medical History:  Diagnosis Date   A-fib ([place])    Anemia    Arthritis    BACK   Bladder cancer (HCC)    BPH (benign prostatic hyperplasia)    Cancer (HCC)    skin cancer on face carcinoma   ED (erectile dysfunction)    Rosanna Randy disease    HLD (hyperlipidemia)    Hypertension    Macular degeneration    Mollusca contagiosa    Osteopenia    Presence of permanent cardiac pacemaker    Sensorineural hearing loss    Squamous cell carcinoma of skin 08/25/2019   R cheek    Squamous cell carcinoma of skin 02/12/2020   L neck     Squamous cell carcinoma of skin 11/30/2020   Right elbow, [facility]   Squamous cell carcinoma of skin 03/16/2021   right base of thumb [facility]   Squamous cell carcinoma of skin 03/16/2021   right preauricular - [facility] 11/17/21   Squamous cell carcinoma of skin 10/05/2021   Right dorsum hand - [facility]   Squamous cell carcinoma of skin 11/22/2021   right inf preauricular anterior, [facility]   Squamous cell carcinoma of skin 11/22/2021   right inf preauricular posterior, [facility]    Past Surgical History:  Procedure Laterality Date   CATARACT EXTRACTION Bilateral    CYSTOSCOPY WITH BIOPSY N/A 01/20/2020   Procedure: CYSTOSCOPY WITH BIOPSY;  Surgeon: Abbie Sons, MD;  Location: [facility] ORS;  Service: Urology;  Laterality: N/A;   CYSTOSCOPY WITH FULGERATION N/A 01/20/2020   Procedure: CYSTOSCOPY WITH FULGERATION;  Surgeon: Abbie Sons, MD;  Location: [facility] ORS;  Service: Urology;  Laterality: N/A;   CYSTOSCOPY WITH FULGERATION N/A 04/19/2020   Procedure: [place] with clot evacuation;  Surgeon: Abbie Sons, MD;  Location: [facility] ORS;  Service: Urology;  Laterality: N/A;   CYSTOSCOPY WITH FULGERATION N/A 01/05/2021   Procedure: CYSTOSCOPY WITH FULGERATION;  Surgeon: Abbie Sons, MD;  Location: [facility] ORS;  Service: Urology;  Laterality: N/A;   HEMORRHOIDECTOMY WITH HEMORRHOID BANDING     KNEE ARTHROSCOPY Left    PACEMAKER INSERTION N/A 03/03/2015   Procedure: INSERTION DUAL LEAD PACEMAKER;  Surgeon: Isaias Cowman, MD;  Location: [facility] ORS;  Service: Cardiovascular;  Laterality: N/A;   PULSE GENERATOR IMPLANT N/A 10/18/2020   Procedure: UNILATERAL PULSE GENERATOR IMPLANT;  Surgeon: [name], MD;  Location: [facility] ORS;  Service: Neurosurgery;  Laterality: N/A;   SPINAL CORD STIMULATOR TRIAL N/A 10/11/2020   Procedure: PERCUTANEOUS SPINAL CORD STIMULATOR TRIAL;  Surgeon: [name], MD;  Location: [facility] ORS;  Service: Neurosurgery;  Laterality: N/A;   THORACIC LAMINECTOMY FOR SPINAL CORD  STIMULATOR N/A 10/18/2020   Procedure: PERCUTANEOUS LEAD PLACEMENT;  Surgeon: [name], MD;  Location: [facility] ORS;  Service: Neurosurgery;  Laterality: N/A;   TONSILLECTOMY     TRANSURETHRAL RESECTION OF BLADDER TUMOR N/A 10/07/2019   Procedure: TRANSURETHRAL RESECTION OF BLADDER TUMOR (TURBT);  Surgeon: Abbie Sons, MD;  Location: [facility] ORS;  Service: Urology;  Laterality: N/A;   TRANSURETHRAL RESECTION OF BLADDER TUMOR N/A 04/13/2020   Procedure: TRANSURETHRAL RESECTION OF BLADDER TUMOR (TURBT);  Surgeon: Abbie Sons, MD;  Location: [facility] ORS;  Service: Urology;  Laterality: N/A;    Medications Prior to Admission  Medication Sig Dispense Refill Last Dose   atorvastatin (LIPITOR) 10 MG tablet Take 10 mg by mouth in the morning.   05/19/2022   calcium carbonate (OSCAL) 1500 (600 Ca) MG TABS tablet Take 600 mg of elemental calcium by mouth in the morning.   05/19/2022   Cholecalciferol (VITAMIN D3) 50 MCG (2000 UT) TABS Take 2,000 Units by mouth in the morning.   05/19/2022   dexamethasone (DECADRON) 2 MG tablet Take 1 tablet (2 mg total) by mouth daily. 7 tablet 0 05/18/2022   diltiazem (CARDIZEM CD) 240 MG 24 hr capsule Take 1 capsule (240 mg total) by mouth daily. 30 capsule 0 05/19/2022   doxycycline (VIBRAMYCIN) 100 MG capsule Take by mouth.   05/18/2022   ELIQUIS 5 MG TABS tablet Take 1 tablet by mouth 2 (two) times daily.   05/19/2022 at 1000   ferrous sulfate 325 (65 FE) MG tablet Take 325 mg by mouth in the morning and at bedtime.   05/19/2022 at 1000   finasteride (PROSCAR) 5 MG tablet TAKE 1 TABLET DAILY (Patient taking differently: Take 5 mg by mouth daily.) 90 tablet 3 05/19/2022   HYDROcodone-acetaminophen (NORCO/VICODIN) 5-325 MG tablet Take 1-2 tablets by mouth every 4 (four) hours as needed for moderate pain. 60 tablet 0 05/19/2022 at 1000   metoprolol tartrate (LOPRESSOR) 25 MG tablet Take 25 mg by mouth 2 (two) times daily.      Multiple Vitamin (MULTIVITAMIN WITH  MINERALS) TABS tablet Take 1 tablet by mouth in the morning.   05/19/2022   Multiple Vitamins-Minerals (PRESERVISION AREDS 2 PO) Take 1 tablet by mouth in the morning and at bedtime.   05/19/2022   Omega-3 Fatty Acids (FISH OIL) 1000 MG CAPS Take 1,000 mg by mouth daily.   05/19/2022 at 1000   tamsulosin (FLOMAX) 0.4 MG CAPS capsule Take 0.4 mg by mouth daily.   05/19/2022   metoprolol tartrate (LOPRESSOR) 50 MG tablet Take 1 tablet (50 mg total) by mouth 2 (two) times daily. (Patient not taking: Reported on 05/19/2022) 60 tablet 0 Not Taking   mirtazapine (REMERON) 15 MG tablet Take 15 mg by mouth at bedtime. (Patient not taking: Reported on 05/19/2022)   Not Taking   naloxone (NARCAN) nasal spray 4 mg/0.1 mL SPRAY 1 SPRAY INTO ONE NOSTRIL AS DIRECTED FOR OPIOID OVERDOSE (TURN PERSON ON SIDE AFTER DOSE. IF NO RESPONSE IN 2-3 MINUTES OR PERSON RESPONDS BUT RELAPSES, REPEAT USING  A NEW SPRAY DEVICE AND SPRAY INTO THE OTHER NOSTRIL. CALL 911 AFTER USE.) * EMERGENCY USE ONLY * (Patient not taking: Reported on 05/11/2022) 1 each 0    Social History   Socioeconomic History   Marital status: Widowed    Spouse name: Not on file   Number of children: Not on file   Years of education: Not on file   Highest education level: Not on file  Occupational History   Not on file  Tobacco Use   Smoking status: Never   Smokeless tobacco: Never  Vaping Use   Vaping Use: Never used  Substance and Sexual Activity   Alcohol use: Yes    Alcohol/week: 7.0 standard drinks of alcohol    Types: 7 Glasses of wine per week    Comment: DAILY    Drug use: No   Sexual activity: Yes    Birth control/protection: None  Other Topics Concern   Not on file  Social History Narrative   Retd. Civil engineer, contracting; [place]; self. Never smoked; alcohol- glass wine/drink every night.     Social Determinants of Health   Financial Resource Strain: Not on file  Food Insecurity: No Food Insecurity (05/19/2022)   Hunger Vital  Sign    Worried About Running Out of Food in the Last Year: Never true    Ran Out of Food in the Last Year: Never true  Transportation Needs: No Transportation Needs (05/19/2022)   PRAPARE - Hydrologist (Medical): No    Lack of Transportation (Non-Medical): No  Physical Activity: Not on file  Stress: Not on file  Social Connections: Not on file  Intimate Partner Violence: Not At Risk (05/19/2022)   Humiliation, Afraid, Rape, and Kick questionnaire    Fear of Current or Ex-Partner: No    Emotionally Abused: No    Physically Abused: No    Sexually Abused: No    Family History  Problem Relation Age of Onset   Diabetes Mother    Lung cancer Father    Cancer Paternal Uncle    Prostate cancer Neg Hx    Bladder Cancer Neg Hx    Kidney cancer Neg Hx       Review of systems complete and found to be negative unless listed above      PHYSICAL EXAM  General: Well developed, well nourished, in no acute distress HEENT:  Normocephalic and atramatic Neck:  No JVD.  Lungs: Clear bilaterally to auscultation and percussion. Heart: HRRR . Normal S1 and S2 without gallops or murmurs.  Abdomen: Bowel sounds are positive, abdomen soft and non-tender  Msk:  Back normal, normal gait. Normal strength and tone for age. Extremities: No clubbing, cyanosis or edema.   Neuro: Alert and oriented X 3. Psych:  Good affect, responds appropriately  Labs:   Lab Results  Component Value Date   WBC 8.7 05/21/2022   HGB 10.7 (L) 05/21/2022   HCT 32.9 (L) 05/21/2022   MCV 91.6 05/21/2022   PLT 333 05/21/2022    Recent Labs  Lab 05/20/22 0516 05/21/22 0412  NA 135 132*  K 4.1 3.9  CL 102 101  CO2 29 27  BUN 16 13  CREATININE 0.59* 0.56*  CALCIUM 8.3* 8.1*  PROT 5.1*  --   BILITOT 0.7  --   ALKPHOS 107  --   ALT 41  --   AST 31  --   GLUCOSE 91 89   Lab Results  Component Value Date  TROPONINI <0.03 05/18/2018   No results found for: "CHOL" No results  found for: "HDL" No results found for: "[place]" No results found for: "TRIG" No results found for: "CHOLHDL" No results found for: "LDLDIRECT"    Radiology: [facility] Chest Port 1 View  Result Date: 05/19/2022 CLINICAL DATA:  Questionable sepsis, evaluate for abnormality. EXAM: PORTABLE CHEST 1 VIEW COMPARISON:  Chest radiograph dated January 16, 2021 FINDINGS: The heart size and mediastinal contours are within normal limits. Prominent atherosclerotic calcification of the aortic arch. Pacemaker leads in the right atrium and right ventricle. Lungs are clear without evidence of focal consolidation or pleural effusion. Spinal stimulator leads projecting over the lower thoracic spine. Osteopenia and thoracic spondylosis. IMPRESSION: No focal consolidation or large pleural effusion. Additional chronic findings as above. Electronically Signed   By: Keane Police D.O.   On: 05/19/2022 13:00    EKG: Atrial fibrillation 80 bpm  ASSESSMENT AND PLAN:   1.  Paroxysmal atrial fibrillation/atrial flutter, with intermittent rapid ventricular rate, likely compensatory for underlying condition, exacerbated by holding home dose of Cardizem CD to 40 mg daily, now rate controlled on diltiazem drip 2.  Status post dual-chamber pacemaker for sick sinus syndrome 3.  Metastatic stage IV bladder cancer on Keytruda 4.  Pressure ulcer 5.  Hypotension, improved after IV fluids  Recommendations  1.  Agree with current therapy 2.  Continue apixaban for stroke prevention 3.  Continue metoprolol tartrate 25 mg twice daily 4.  Continue diltiazem drip for now 5.  Transition to home dose of Cardizem CD 140 mg daily later today or in a.m.  Signed: Isaias Cowman MD,PhD, [facility] 05/21/2022, 12:56 PM

## 2022-05-21 NOTE — Progress Notes (Signed)
       CROSS COVER NOTE  NAME: IAAN OREGEL MRN: [id number] DOB : 1927-09-03    Time of Service   [phone] AM  HPI/Events of Note   Heart rate 140s max omn dilt drip Has had several dilt boluses before initiating drip.  Only real improvement in patients rate was after his oral metoprolol  Assessment and  Interventions   Assessment:  Plan: Metoprolol 10 mg IVP       Kathlene Cote NP [facility]

## 2022-05-22 ENCOUNTER — Ambulatory Visit: Payer: Medicare HMO | Admitting: Physician Assistant

## 2022-05-22 DIAGNOSIS — I4892 Unspecified atrial flutter: Secondary | ICD-10-CM

## 2022-05-22 DIAGNOSIS — E440 Moderate protein-calorie malnutrition: Secondary | ICD-10-CM | POA: Insufficient documentation

## 2022-05-22 LAB — CBC
HCT: 33.9 % — ABNORMAL LOW (ref 39.0–52.0)
Hemoglobin: 11.3 g/dL — ABNORMAL LOW (ref 13.0–17.0)
MCH: 30.5 pg (ref 26.0–34.0)
MCHC: 33.3 g/dL (ref 30.0–36.0)
MCV: 91.6 fL (ref 80.0–100.0)
Platelets: 327 10*3/uL (ref 150–400)
RBC: 3.7 MIL/uL — ABNORMAL LOW (ref 4.22–5.81)
RDW: 14.5 % (ref 11.5–15.5)
WBC: 9 10*3/uL (ref 4.0–10.5)
nRBC: 0 % (ref 0.0–0.2)

## 2022-05-22 LAB — BASIC METABOLIC PANEL
Anion gap: 8 (ref 5–15)
BUN: 14 mg/dL (ref 8–23)
CO2: 26 mmol/L (ref 22–32)
Calcium: 8.8 mg/dL — ABNORMAL LOW (ref 8.9–10.3)
Chloride: 100 mmol/L (ref 98–111)
Creatinine, Ser: 0.61 mg/dL (ref 0.61–1.24)
GFR, Estimated: >60 mL/min (ref >60)
Glucose, Bld: 95 mg/dL (ref 70–99)
Potassium: 4.3 mmol/L (ref 3.5–5.1)
Sodium: 134 mmol/L — ABNORMAL LOW (ref 135–145)

## 2022-05-22 LAB — MAGNESIUM: Magnesium: 2.2 mg/dL (ref 1.7–2.4)

## 2022-05-22 MED ORDER — AMIODARONE LOAD VIA INFUSION
150.0000 mg | Freq: Once | INTRAVENOUS | Status: AC
  Administered 2022-05-22: 150 mg via INTRAVENOUS
  Filled 2022-05-22: qty 83.34

## 2022-05-22 MED ORDER — VITAMIN C 500 MG PO TABS
500.0000 mg | ORAL_TABLET | Freq: Two times a day (BID) | ORAL | Status: DC
  Administered 2022-05-22 – 2022-05-25 (×6): 500 mg via ORAL
  Filled 2022-05-22 (×6): qty 1

## 2022-05-22 MED ORDER — AMIODARONE HCL IN DEXTROSE 360-4.14 MG/200ML-% IV SOLN
30.0000 mg/h | INTRAVENOUS | Status: AC
  Administered 2022-05-22 – 2022-05-23 (×2): 30 mg/h via INTRAVENOUS
  Filled 2022-05-22: qty 200

## 2022-05-22 MED ORDER — AMIODARONE HCL IN DEXTROSE 360-4.14 MG/200ML-% IV SOLN
60.0000 mg/h | INTRAVENOUS | Status: AC
  Administered 2022-05-22: 60 mg/h via INTRAVENOUS
  Filled 2022-05-22 (×2): qty 200

## 2022-05-22 MED ORDER — METOPROLOL TARTRATE 25 MG PO TABS
25.0000 mg | ORAL_TABLET | Freq: Once | ORAL | Status: AC
  Administered 2022-05-22: 25 mg via ORAL
  Filled 2022-05-22: qty 1

## 2022-05-22 MED ORDER — ENSURE ENLIVE PO LIQD
237.0000 mL | Freq: Three times a day (TID) | ORAL | Status: DC
  Administered 2022-05-22 – 2022-05-25 (×9): 237 mL via ORAL

## 2022-05-22 MED ORDER — ZINC SULFATE 220 (50 ZN) MG PO CAPS
220.0000 mg | ORAL_CAPSULE | Freq: Every day | ORAL | Status: DC
  Administered 2022-05-22 – 2022-05-25 (×4): 220 mg via ORAL
  Filled 2022-05-22 (×4): qty 1

## 2022-05-22 MED ORDER — METOPROLOL TARTRATE 50 MG PO TABS
50.0000 mg | ORAL_TABLET | Freq: Two times a day (BID) | ORAL | Status: DC
  Administered 2022-05-22 – 2022-05-25 (×6): 50 mg via ORAL
  Filled 2022-05-22 (×6): qty 1

## 2022-05-22 MED ORDER — METOPROLOL TARTRATE 5 MG/5ML IV SOLN
5.0000 mg | Freq: Once | INTRAVENOUS | Status: AC
  Administered 2022-05-22: 5 mg via INTRAVENOUS
  Filled 2022-05-22: qty 5

## 2022-05-22 MED ORDER — DILTIAZEM HCL 25 MG/5ML IV SOLN
10.0000 mg | Freq: Once | INTRAVENOUS | Status: AC
  Administered 2022-05-22: 10 mg via INTRAVENOUS
  Filled 2022-05-22: qty 5

## 2022-05-22 NOTE — Progress Notes (Signed)
       CROSS COVER NOTE  NAME: Christopher [name] MRN: [id number] DOB : 1928-02-15 ATTENDING PHYSICIAN: Enzo Bi, MD    Date of Service   05/22/2022   HPI/Events of Note   Notified that Mr Isa is back in AFIB with RVR--> HR 148 BP 123/85.  Interventions   Assessment/Plan:  5 mg IV metoprolol     This document was prepared using Dragon voice recognition software and may include unintentional dictation errors.  Neomia Glass DNP, MBA, FNP-BC Nurse Practitioner [facility] Pager [phone]

## 2022-05-22 NOTE — Progress Notes (Signed)
PROGRESS NOTE    Christopher [name]  FFM:[id number] DOB: 05/22/1928 DOA: 05/19/2022 PCP: [name], MD  [id number]  LOS: 1 day   Brief hospital course:   Assessment & Plan: Christopher [name] is a 88 y.o. male with medical history significant of stage IV metastatic bladder cancer on Keytruda, Gilbert disease, hypertension, hyperlipidemia, atrial fibrillation, who presents to the ED with hypotension.   Mr. Brine presented to his oncologist's office today for his planned infusion of Keytruda.  Vital signs obtained at that time demonstrated blood pressure of 78/56 with heart rate of 82.    * Atrial flutter with rapid ventricular response (HCC) --on presentation, pt was in Aflutter but rate controlled.   --on 11/11, pt went into Aflutter 2:1 with rate of 152.  Pt was asymptomatic.  Rate uncontrolled despite multiple doses of IV dilt, so pt transferred to PCU for dilt gtt.  Pt briefly converted back to NSR twice since then, but went back into Aflutter RVR.  Cardiology consulted. Plan: --d/c dilt gtt and start IV amio, per cardio --cont cardizem 180 mg daily --increase Lopressor to 50 mg BID --cont home Eliquis  Hypotension Patient presented with significant hypotension at his oncologist's office this morning that has subsequently improved since arriving to the ER.  He is actually on the hypertensive side after receiving fluids.  Given he also has leukocytosis while on immunomodulatory therapy, there is concern for underlying etiology is infection, pt was therefore started in empiric abx on presentation.  Abx d/c'ed. --hypotension may be due to too much BP medication at home, however, metop and cardizem needed for rate control. Plan: --monitor BP while on titrating up rate control agents  Leukocytosis Potentially secondary to steroids as patient just finished a course of Decadron, however given hypotension, there is concern for underlying infection.  No source on examination.   Patient does have a stage II pressure ulcer, but no evidence of induration or purulent drainage.  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   --abx d/c'ed  Pressure ulcer of coccygeal region, stage 2 ([place]) Patient has a pressure ulcer on his buttocks that has been present for couple weeks after medication changes made him increasingly somnolent.  He has a scheduled appointment with the wound care center on 11/13.  No evidence of infection at this time. --wound care per order  Urothelial carcinoma of bladder ([place]) Stage IV metastatic urothelial carcinoma of the bladder with current metastasis involving lymph nodes and bone.  He is undergoing immunotherapy with Keytruda after being deemed a poor candidate for chemotherapy.  Malnutrition of moderate degree --supplements per dietician   Sepsis, ruled out   DVT prophylaxis: [id number] Code Status: DNR  Family Communication: daughters updated at bedside and on video call Level of care: Progressive Dispo:   The patient is from: home Anticipated d/c is to: SNF rehab Anticipated d/c date is: 1-2 days for medical readiness   Subjective and Interval History:  Pt was back in NSR yesterday evening, so dilt gtt d/c'ed, however, back to Aflutter w RVR again this morning.  Still asymptomatic.      Objective: Vitals:   05/22/22 0515 05/22/22 0700 05/22/22 1123 05/22/22 1553  BP: 123/85 132/61 101/76 111/63  Pulse: (!) 147 (!) 49 (!) 108 77  Resp: '20 16 20 '$ (!) 21  Temp:   98.1 F (36.7 C) 98.2 F (36.8 C)  TempSrc:   Oral Oral  SpO2:  93% 96% 95%  Weight:      Height:        Intake/Output Summary (Last 24 hours) at 05/22/2022 1721 Last data filed at 05/22/2022 1553 Gross per 24 hour  Intake 385.42 ml  Output 2050 ml  Net -1664.58 ml   Filed Weights   05/19/22 1212  Weight: 69.7 kg    Examination:   Constitutional: NAD, AAOx3 HEENT: conjunctivae and lids normal,  EOMI CV: No cyanosis.   RESP: normal respiratory effort, on RA Neuro: II - XII grossly intact.   Psych: Normal mood and affect.  Appropriate judgement and reason   Data Reviewed: I have personally reviewed labs and imaging studies  Time spent: 50 minutes  Enzo Bi, MD [facility] If 7PM-7AM, please contact night-coverage 05/22/2022, 5:21 PM

## 2022-05-22 NOTE — Assessment & Plan Note (Signed)
--  supplements per dietician  

## 2022-05-22 NOTE — Progress Notes (Signed)
[facility] Cardiology  CARDIOLOGY CONSULT NOTE  Patient ID: Christopher [name] MRN: [id number] DOB/AGE: 1928-02-22 88 y.o.  Admit date: 05/19/2022 Referring Physician [name] Primary Physician [facility] Primary Cardiologist Fath / Paraschos Reason for Consultation atrial fibrillation/atrial flutter with rapid ventricular rate  HPI: 88 year old gentleman referred for evaluation of atrial fibrillation/atrial flutter with rapid ventricular rate.  Patient has known history of metastatic stage IV bladder cancer.  He presented to oncology office on 05/19/2022 for Keytruda infusion at which time he was noted to be hypotensive.  Heart rate was 82 bpm.  Patient was sent to [facility] ED where he was noted to have elevated white count with stage II pressure ulcer.  Patient was started on intravenous biotics and was admitted for further evaluation and management.  To low blood pressure, Cardizem CD was held.  The patient has had intermittent episodes of atrial flutter with rapid ventricular rate with 2-1 conduction telemetry.  Patient appears to be asymptomatic, denies chest pain, shortness of breath, or patient's.  The patient was started on diltiazem drip, currently is in atrial fibrillation at a rate of 80 bpm.  Patient has known history of sick sinus syndrome, status post dual-chamber pacemaker.  Interval History: -feels a slight "upset stomach" but denies chest pain, palpitations, shortness of breath -remains in AF with intermittent RVR overnight and into the morning with rate in the 140s, not responsive to IV diltiazem '10mg'$  x1 or his PO diltiazem   Review of systems complete and found to be negative unless listed above     Past Medical History:  Diagnosis Date   A-fib ([place])    Anemia    Arthritis    BACK   Bladder cancer (HCC)    BPH (benign prostatic hyperplasia)    Cancer (HCC)    skin cancer on face carcinoma   ED (erectile dysfunction)    Rosanna Randy disease    HLD (hyperlipidemia)    Hypertension     Macular degeneration    Mollusca contagiosa    Osteopenia    Presence of permanent cardiac pacemaker    Sensorineural hearing loss    Squamous cell carcinoma of skin 08/25/2019   R cheek    Squamous cell carcinoma of skin 02/12/2020   L neck    Squamous cell carcinoma of skin 11/30/2020   Right elbow, [facility]   Squamous cell carcinoma of skin 03/16/2021   right base of thumb [facility]   Squamous cell carcinoma of skin 03/16/2021   right preauricular - [facility] 11/17/21   Squamous cell carcinoma of skin 10/05/2021   Right dorsum hand - [facility]   Squamous cell carcinoma of skin 11/22/2021   right inf preauricular anterior, [facility]   Squamous cell carcinoma of skin 11/22/2021   right inf preauricular posterior, [facility]    Past Surgical History:  Procedure Laterality Date   CATARACT EXTRACTION Bilateral    CYSTOSCOPY WITH BIOPSY N/A 01/20/2020   Procedure: CYSTOSCOPY WITH BIOPSY;  Surgeon: Abbie Sons, MD;  Location: [facility] ORS;  Service: Urology;  Laterality: N/A;   CYSTOSCOPY WITH FULGERATION N/A 01/20/2020   Procedure: CYSTOSCOPY WITH FULGERATION;  Surgeon: Abbie Sons, MD;  Location: [facility] ORS;  Service: Urology;  Laterality: N/A;   CYSTOSCOPY WITH FULGERATION N/A 04/19/2020   Procedure: [place] with clot evacuation;  Surgeon: Abbie Sons, MD;  Location: [facility] ORS;  Service: Urology;  Laterality: N/A;   CYSTOSCOPY WITH FULGERATION N/A 01/05/2021   Procedure: CYSTOSCOPY WITH FULGERATION;  Surgeon: Abbie Sons, MD;  Location: [facility] ORS;  Service: Urology;  Laterality: N/A;   HEMORRHOIDECTOMY WITH HEMORRHOID BANDING     KNEE ARTHROSCOPY Left    PACEMAKER INSERTION N/A 03/03/2015   Procedure: INSERTION DUAL LEAD PACEMAKER;  Surgeon: Isaias Cowman, MD;  Location: [facility] ORS;  Service: Cardiovascular;  Laterality: N/A;   PULSE GENERATOR IMPLANT N/A 10/18/2020   Procedure: UNILATERAL PULSE GENERATOR IMPLANT;  Surgeon: [name], MD;  Location: [facility] ORS;  Service:  Neurosurgery;  Laterality: N/A;   SPINAL CORD STIMULATOR TRIAL N/A 10/11/2020   Procedure: PERCUTANEOUS SPINAL CORD STIMULATOR TRIAL;  Surgeon: [name], MD;  Location: [facility] ORS;  Service: Neurosurgery;  Laterality: N/A;   THORACIC LAMINECTOMY FOR SPINAL CORD STIMULATOR N/A 10/18/2020   Procedure: PERCUTANEOUS LEAD PLACEMENT;  Surgeon: [name], MD;  Location: [facility] ORS;  Service: Neurosurgery;  Laterality: N/A;   TONSILLECTOMY     TRANSURETHRAL RESECTION OF BLADDER TUMOR N/A 10/07/2019   Procedure: TRANSURETHRAL RESECTION OF BLADDER TUMOR (TURBT);  Surgeon: Abbie Sons, MD;  Location: [facility] ORS;  Service: Urology;  Laterality: N/A;   TRANSURETHRAL RESECTION OF BLADDER TUMOR N/A 04/13/2020   Procedure: TRANSURETHRAL RESECTION OF BLADDER TUMOR (TURBT);  Surgeon: Abbie Sons, MD;  Location: [facility] ORS;  Service: Urology;  Laterality: N/A;    Medications Prior to Admission  Medication Sig Dispense Refill Last Dose   atorvastatin (LIPITOR) 10 MG tablet Take 10 mg by mouth in the morning.   05/19/2022   calcium carbonate (OSCAL) 1500 (600 Ca) MG TABS tablet Take 600 mg of elemental calcium by mouth in the morning.   05/19/2022   Cholecalciferol (VITAMIN D3) 50 MCG (2000 UT) TABS Take 2,000 Units by mouth in the morning.   05/19/2022   dexamethasone (DECADRON) 2 MG tablet Take 1 tablet (2 mg total) by mouth daily. 7 tablet 0 05/18/2022   diltiazem (CARDIZEM CD) 240 MG 24 hr capsule Take 1 capsule (240 mg total) by mouth daily. 30 capsule 0 05/19/2022   doxycycline (VIBRAMYCIN) 100 MG capsule Take by mouth.   05/18/2022   ELIQUIS 5 MG TABS tablet Take 1 tablet by mouth 2 (two) times daily.   05/19/2022 at 1000   ferrous sulfate 325 (65 FE) MG tablet Take 325 mg by mouth in the morning and at bedtime.   05/19/2022 at 1000   finasteride (PROSCAR) 5 MG tablet TAKE 1 TABLET DAILY (Patient taking differently: Take 5 mg by mouth daily.) 90 tablet 3 05/19/2022   HYDROcodone-acetaminophen  (NORCO/VICODIN) 5-325 MG tablet Take 1-2 tablets by mouth every 4 (four) hours as needed for moderate pain. 60 tablet 0 05/19/2022 at 1000   metoprolol tartrate (LOPRESSOR) 25 MG tablet Take 25 mg by mouth 2 (two) times daily.      Multiple Vitamin (MULTIVITAMIN WITH MINERALS) TABS tablet Take 1 tablet by mouth in the morning.   05/19/2022   Multiple Vitamins-Minerals (PRESERVISION AREDS 2 PO) Take 1 tablet by mouth in the morning and at bedtime.   05/19/2022   Omega-3 Fatty Acids (FISH OIL) 1000 MG CAPS Take 1,000 mg by mouth daily.   05/19/2022 at 1000   tamsulosin (FLOMAX) 0.4 MG CAPS capsule Take 0.4 mg by mouth daily.   05/19/2022   metoprolol tartrate (LOPRESSOR) 50 MG tablet Take 1 tablet (50 mg total) by mouth 2 (two) times daily. (Patient not taking: Reported on 05/19/2022) 60 tablet 0 Not Taking   mirtazapine (REMERON) 15 MG tablet Take 15 mg by mouth at bedtime. (Patient not taking: Reported on 05/19/2022)  Not Taking   naloxone (NARCAN) nasal spray 4 mg/0.1 mL SPRAY 1 SPRAY INTO ONE NOSTRIL AS DIRECTED FOR OPIOID OVERDOSE (TURN PERSON ON SIDE AFTER DOSE. IF NO RESPONSE IN 2-3 MINUTES OR PERSON RESPONDS BUT RELAPSES, REPEAT USING A NEW SPRAY DEVICE AND SPRAY INTO THE OTHER NOSTRIL. CALL 911 AFTER USE.) * EMERGENCY USE ONLY * (Patient not taking: Reported on 05/11/2022) 1 each 0    Social History   Socioeconomic History   Marital status: Widowed    Spouse name: Not on file   Number of children: Not on file   Years of education: Not on file   Highest education level: Not on file  Occupational History   Not on file  Tobacco Use   Smoking status: Never   Smokeless tobacco: Never  Vaping Use   Vaping Use: Never used  Substance and Sexual Activity   Alcohol use: Yes    Alcohol/week: 7.0 standard drinks of alcohol    Types: 7 Glasses of wine per week    Comment: DAILY    Drug use: No   Sexual activity: Yes    Birth control/protection: None  Other Topics Concern   Not on file   Social History Narrative   Retd. Civil engineer, contracting; [employer]; self. Never smoked; alcohol- glass wine/drink every night.     Social Determinants of Health   Financial Resource Strain: Not on file  Food Insecurity: No Food Insecurity (05/19/2022)   Hunger Vital Sign    Worried About Running Out of Food in the Last Year: Never true    Ran Out of Food in the Last Year: Never true  Transportation Needs: No Transportation Needs (05/19/2022)   PRAPARE - Hydrologist (Medical): No    Lack of Transportation (Non-Medical): No  Physical Activity: Not on file  Stress: Not on file  Social Connections: Not on file  Intimate Partner Violence: Not At Risk (05/19/2022)   Humiliation, Afraid, Rape, and Kick questionnaire    Fear of Current or Ex-Partner: No    Emotionally Abused: No    Physically Abused: No    Sexually Abused: No    Family History  Problem Relation Age of Onset   Diabetes Mother    Lung cancer Father    Cancer Paternal Uncle    Prostate cancer Neg Hx    Bladder Cancer Neg Hx    Kidney cancer Neg Hx      PHYSICAL EXAM  General: Pleasant elderly caucasian male, well developed, well nourished, in no acute distress.  Laying flat in PCU bed HEENT:  Normocephalic and atraumatic Neck:  No JVD.  Lungs: Normal respiratory effort on room air.  Clear bilaterally to auscultation without appreciable crackles or wheezes Heart: Tachycardic irregularly irregular rhythm. Normal S1 and S2 without gallops or murmurs.  Abdomen: Nondistended appearing Msk:  Normal strength and tone for age. Extremities: No clubbing, cyanosis.  Trace bilateral peripheral edema.   Neuro: Alert and oriented X 3. Psych:  Good affect, responds appropriately  Labs:   Lab Results  Component Value Date   WBC 9.0 05/22/2022   HGB 11.3 (L) 05/22/2022   HCT 33.9 (L) 05/22/2022   MCV 91.6 05/22/2022   PLT 327 05/22/2022    Recent Labs  Lab 05/20/22 0516 05/21/22 0412  05/22/22 0525  NA 135   < > 134*  K 4.1   < > 4.3  CL 102   < > 100  CO2 29   < >  26  BUN 16   < > 14  CREATININE 0.59*   < > 0.61  CALCIUM 8.3*   < > 8.8*  PROT 5.1*  --   --   BILITOT 0.7  --   --   ALKPHOS 107  --   --   ALT 41  --   --   AST 31  --   --   GLUCOSE 91   < > 95   < > = values in this interval not displayed.    Lab Results  Component Value Date   TROPONINI <0.03 05/18/2018   No results found for: "CHOL" No results found for: "HDL" No results found for: "[place]" No results found for: "TRIG" No results found for: "CHOLHDL" No results found for: "LDLDIRECT"    Radiology: [facility] Chest Port 1 View  Result Date: 05/19/2022 CLINICAL DATA:  Questionable sepsis, evaluate for abnormality. EXAM: PORTABLE CHEST 1 VIEW COMPARISON:  Chest radiograph dated January 16, 2021 FINDINGS: The heart size and mediastinal contours are within normal limits. Prominent atherosclerotic calcification of the aortic arch. Pacemaker leads in the right atrium and right ventricle. Lungs are clear without evidence of focal consolidation or pleural effusion. Spinal stimulator leads projecting over the lower thoracic spine. Osteopenia and thoracic spondylosis. IMPRESSION: No focal consolidation or large pleural effusion. Additional chronic findings as above. Electronically Signed   By: Keane Police D.O.   On: 05/19/2022 13:00    EKG: Atrial fibrillation 80 bpm  Telemetry reviewed by me: AF RVR rate 130s-148  Data reviewed by me: Cross cover note hospitalist progress note, CBC, BMP, vitals, telemetry, EKGs  ASSESSMENT AND PLAN:   1.  Paroxysmal atrial fibrillation/atrial flutter, with intermittent rapid ventricular rate, likely compensatory for underlying condition, exacerbated by holding home dose of Cardizem CD 240 mg daily, now rate controlled on diltiazem drip.  2.  Status post dual-chamber pacemaker for sick sinus syndrome 3.  Metastatic stage IV bladder cancer on Keytruda 4.  Pressure  ulcer 5.  Hypotension, improved after IV fluids  Recommendations  1.  Agree with current therapy 2.  Continue apixaban for stroke prevention 3.  Continue metoprolol tartrate 25 mg twice daily 4.  S/p IV diltiazem 10 mg x 1 and Cardizem CD 180 the morning of 11/13, remains in AF RVR rate 140s 5.  Start IV amiodarone bolus and continuous infusion x24 hours for an attempt at rate and rhythm control  This patient's plan of care was discussed and created with Dr. Saralyn Pilar and he is in agreement.    Signed: Alanson Puls Raniya Golembeski PA-C 05/22/2022, 8:42 AM

## 2022-05-22 NOTE — Progress Notes (Signed)
Initial Nutrition Assessment  DOCUMENTATION CODES:   Non-severe (moderate) malnutrition in context of chronic illness  INTERVENTION:   -MVI with minerals daily -Ensure Enlive po TID, each supplement provides 350 kcal and 20 grams of protein -500 mg vitamin C BID -220 mg zinc sulfate daily x 14 days  NUTRITION DIAGNOSIS:   Moderate Malnutrition related to chronic illness (stage IV bladder cancer) as evidenced by mild fat depletion, moderate fat depletion, mild muscle depletion, moderate muscle depletion.  GOAL:   Patient will meet greater than or equal to 90% of their needs  MONITOR:   PO intake, Supplement acceptance  REASON FOR ASSESSMENT:   Malnutrition Screening Tool    ASSESSMENT:   Pt with medical history significant of stage IV metastatic bladder cancer on Keytruda, Gilbert disease, hypertension, hyperlipidemia, atrial fibrillation, who presents with hypotension.  Pt admitted with hypotension.   Reviewed I/O's: -1.2 L x 24 hours and +1.5 L since admission  UOP: 1.3 L x 24 hours  Spoke with pt at bedside, who was pleasant and in good spirits today. Per pt, he has a poor appetite, which has been ongoing for at least several months, secondary to early satiety. He typically consumes 2 meals per day, which consists of a small meal, such as a sandwich. Noted meal completions 25-50%. Per pt, he had a "good breakfast" today of oatmeal and eggs, but only had a few bites of salad for lunch.    Pt reports he used to weigh 220# and estimated he has lost down to 160#. Pt is unsure when wt loss started occurring, but states it was gradual. He believes his weight started decreasing at the same time as his appetite. Reviewed wt hx; pt has experienced a 8.5% wt loss over the past month, which is significant for time frame.   Discussed importance of good meal and supplement intake to promote healing. Pt amenable to supplements, stating he drinks these often at home, but unsure how  often.   Medications reviewed and include calcium carbonate, vitamin De, cardizem, miralax, and amiodarone.   Labs reviewed: Na: 134, CBGS: 119.   NUTRITION - FOCUSED PHYSICAL EXAM:  Flowsheet Row Most Recent Value  Orbital Region Mild depletion  Upper Arm Region Mild depletion  Thoracic and Lumbar Region Mild depletion  Buccal Region Moderate depletion  Temple Region Moderate depletion  Clavicle Bone Region Mild depletion  Clavicle and Acromion Bone Region Mild depletion  Scapular Bone Region Mild depletion  Dorsal Hand Moderate depletion  Patellar Region Moderate depletion  Anterior Thigh Region Moderate depletion  Posterior Calf Region Moderate depletion  Edema (RD Assessment) Mild  Hair Reviewed  Eyes Reviewed  Mouth Reviewed  Skin Reviewed  Nails Reviewed       Diet Order:   Diet Order             Diet regular Room service appropriate? Yes; Fluid consistency: Thin  Diet effective now                   EDUCATION NEEDS:   Education needs have been addressed  Skin:  Skin Assessment: Skin Integrity Issues: Skin Integrity Issues:: Stage II Stage II: lt buttocks  Last BM:  05/22/22 (type 5)  Height:   Ht Readings from Last 1 Encounters:  05/19/22 '5\' 11"'$  (1.803 m)    Weight:   Wt Readings from Last 1 Encounters:  05/19/22 69.7 kg    Ideal Body Weight:  78.2 kg  BMI:  Body mass index is  21.43 kg/m.  Estimated Nutritional Needs:   Kcal:  1900-2100  Protein:  90-105 grams  Fluid:  > 1.9 L    Loistine Chance, RD, LDN, [place] Registered Dietitian II Certified Diabetes Care and Education Specialist Please refer to [facility] for RD and/or RD on-call/weekend/after hours pager

## 2022-05-22 NOTE — Consult Note (Signed)
WOC Nurse Consult Note: Reason for Consult:Stage 2 pressure injury to buttocks, development of pressure injury prevention plan  Wound type:Pressure Pressure Injury POA: Yes Measurement:Per Nursing Flow Sheet, 5cm x 3cm x 0.1cm Wound PGF:[id number], granulating Drainage (amount, consistency, odor) scant serous Periwound:intact Dressing procedure/placement/frequency: The topical POC for this wound is guidance by the standing skin care order set, but implemented here using a daily cleanse with NS, patting dry and toped with a folded layer of antimicrobial nonadherent gauze (xeroform). This is to be covered with a silicone foam dressing and changed daily. Turning and repositioning is in place, but I have added to minimize time in the supine position. Bilateral Prevalon pressure redistribution heel boots are provided for use while in bed and a pressure redistribution chair cushion is provided for use while OOB in the chair and is to be sent with patient at time of discharge.   [place] nursing team will not follow, but will remain available to this patient, the nursing and medical teams.  Please re-consult if needed.  Thank you for inviting [place] to participate in this patient's Plan of Care.  Maudie Flakes, MSN, RN, CNS, [place], Serita Grammes, [employer], [employer] phone:  [phone]

## 2022-05-23 ENCOUNTER — Telehealth: Payer: Self-pay | Admitting: *Deleted

## 2022-05-23 DIAGNOSIS — I4892 Unspecified atrial flutter: Secondary | ICD-10-CM | POA: Diagnosis not present

## 2022-05-23 LAB — BASIC METABOLIC PANEL
Anion gap: 7 (ref 5–15)
BUN: 17 mg/dL (ref 8–23)
CO2: 29 mmol/L (ref 22–32)
Calcium: 8.8 mg/dL — ABNORMAL LOW (ref 8.9–10.3)
Chloride: 100 mmol/L (ref 98–111)
Creatinine, Ser: 0.63 mg/dL (ref 0.61–1.24)
GFR, Estimated: >60 mL/min (ref >60)
Glucose, Bld: 127 mg/dL — ABNORMAL HIGH (ref 70–99)
Potassium: 3.7 mmol/L (ref 3.5–5.1)
Sodium: 136 mmol/L (ref 135–145)

## 2022-05-23 LAB — CBC
HCT: 33.7 % — ABNORMAL LOW (ref 39.0–52.0)
Hemoglobin: 10.9 g/dL — ABNORMAL LOW (ref 13.0–17.0)
MCH: 29.5 pg (ref 26.0–34.0)
MCHC: 32.3 g/dL (ref 30.0–36.0)
MCV: 91.3 fL (ref 80.0–100.0)
Platelets: 315 10*3/uL (ref 150–400)
RBC: 3.69 MIL/uL — ABNORMAL LOW (ref 4.22–5.81)
RDW: 14.6 % (ref 11.5–15.5)
WBC: 9.3 10*3/uL (ref 4.0–10.5)
nRBC: 0 % (ref 0.0–0.2)

## 2022-05-23 LAB — MAGNESIUM: Magnesium: 2.2 mg/dL (ref 1.7–2.4)

## 2022-05-23 MED ORDER — IBUPROFEN 400 MG PO TABS
400.0000 mg | ORAL_TABLET | Freq: Four times a day (QID) | ORAL | Status: DC | PRN
  Administered 2022-05-25: 400 mg via ORAL
  Filled 2022-05-23: qty 1

## 2022-05-23 MED ORDER — DILTIAZEM HCL ER COATED BEADS 120 MG PO CP24: 240.0000 mg | ORAL_CAPSULE | Freq: Every day | ORAL | Status: DC

## 2022-05-23 MED ORDER — AMIODARONE HCL 200 MG PO TABS: 200.0000 mg | ORAL_TABLET | Freq: Every day | ORAL | Status: DC

## 2022-05-23 MED ORDER — DILTIAZEM HCL ER COATED BEADS 120 MG PO CP24
240.0000 mg | ORAL_CAPSULE | Freq: Every day | ORAL | Status: DC
  Administered 2022-05-24 – 2022-05-25 (×2): 240 mg via ORAL
  Filled 2022-05-23 (×2): qty 2

## 2022-05-23 MED ORDER — AMIODARONE HCL 200 MG PO TABS
200.0000 mg | ORAL_TABLET | Freq: Two times a day (BID) | ORAL | Status: DC
  Administered 2022-05-23 – 2022-05-25 (×5): 200 mg via ORAL
  Filled 2022-05-23 (×5): qty 1

## 2022-05-23 NOTE — Progress Notes (Signed)
PROGRESS NOTE    [name]  [name] DOB: 05/23/1928 DOA: 05/19/2022 PCP: [name], MD  [id number]  LOS: 2 days   Brief hospital course:   Assessment & Plan: Christopher [name] is a 88 y.o. male with medical history significant of stage IV metastatic bladder cancer on Keytruda, Gilbert disease, hypertension, hyperlipidemia, atrial fibrillation, who presents to the ED with hypotension.   [name] presented to his oncologist's office today for his planned infusion of Keytruda.  Vital signs obtained at that time demonstrated blood pressure of 78/56 with heart rate of 82.    * Atrial flutter with rapid ventricular response (HCC) --on presentation, pt was in Aflutter but rate controlled.   --on 11/11, pt went into Aflutter 2:1 with rate of 152.  Pt was asymptomatic.  Rate uncontrolled despite multiple doses of IV dilt, so pt transferred to PCU for dilt gtt.  Pt briefly converted back to NSR twice since then, but went back into Aflutter RVR.  Cardiology consulted, started pt on IV amio. --rate controlled since yesterday Plan: --transition to oral amio by cardio --increase Cardizem to 240 mg daily, per cardio --cont Lopressor to 50 mg BID --cont home Eliquis  Hypotension Patient presented with significant hypotension at his oncologist's office this morning that has subsequently improved since arriving to the ER.  He is actually on the hypertensive side after receiving fluids.  Given he also has leukocytosis while on immunomodulatory therapy, there is concern for underlying etiology is infection, pt was therefore started in empiric abx on presentation.  Abx d/c'ed. --hypotension may be due to too much BP medication at home, however, metop and cardizem needed for rate control. Plan: --monitor BP while on titrating up rate control agents  Leukocytosis Potentially secondary to steroids as patient just finished a course of Decadron, however given hypotension, there  is concern for underlying infection.  No source on examination.  Patient does have a stage II pressure ulcer, but no evidence of induration or purulent drainage.  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   --abx d/c'ed  Pressure ulcer of coccygeal region, stage 2 ([place]) Patient has a pressure ulcer on his buttocks that has been present for couple weeks after medication changes made him increasingly somnolent.  He has a scheduled appointment with the wound care center on 11/13.  No evidence of infection at this time. --wound care per order  Urothelial carcinoma of bladder ([place]) Stage IV metastatic urothelial carcinoma of the bladder with current metastasis involving lymph nodes and bone.  He is undergoing immunotherapy with Keytruda after being deemed a poor candidate for chemotherapy.  Malnutrition of moderate degree --supplements per dietician   Sepsis, ruled out   DVT prophylaxis: QI:[id number] Code Status: DNR  Family Communication: daughter updated at bedside  Level of care: Progressive Dispo:   The patient is from: home Anticipated d/c is to: SNF rehab Anticipated d/c date is: tomorrow   Subjective and Interval History:  Pt reported multiple loose BM's.    HR controlled since yesterday.    Objective: Vitals:   05/23/22 0432 05/23/22 0814 05/23/22 1146 05/23/22 1526  BP:  118/64 113/61 124/60  Pulse:  80 71 72  Resp:  '18 17 17  '$ Temp: 97.9 F (36.6 C) 97.8 F (36.6 C) 98 F (36.7 C) 97.9 F (36.6 C)  TempSrc: Oral Axillary Oral Oral  SpO2:  95% 97% 97%  Weight:  Height:        Intake/Output Summary (Last 24 hours) at 05/23/2022 1725 Last data filed at 05/23/2022 1527 Gross per 24 hour  Intake 1278.73 ml  Output 2300 ml  Net -1021.27 ml   Filed Weights   05/19/22 1212  Weight: 69.7 kg    Examination:   Constitutional: NAD, AAOx3 HEENT: conjunctivae and lids normal, EOMI CV: No cyanosis.    RESP: normal respiratory effort, on RA Neuro: II - XII grossly intact.   Psych: Normal mood and affect.  Appropriate judgement and reason   Data Reviewed: I have personally reviewed labs and imaging studies  Time spent: 35 minutes  Enzo Bi, MD [facility] If 7PM-7AM, please contact night-coverage 05/23/2022, 5:25 PM

## 2022-05-23 NOTE — Progress Notes (Signed)
Physical Therapy Treatment Patient Details Name: Christopher [name] MRN: [id number] DOB: 1927/07/14 Today's Date: 05/23/2022   History of Present Illness Christopher [name] is a 88 y.o. male with medical history significant of stage IV metastatic bladder cancer on Keytruda, Gilbert disease, hypertension, hyperlipidemia, atrial fibrillation, who presents to the ED with hypotension and leukocytosis. MD assessment includes; Atrial flutter with rapid ventricular response, hypotension, leukocytosis, pressure ulcer of coccygeal region, stage 2, and moderate malnutrition.    PT Comments    Pt was pleasant and motivated to participate during the session and put forth good effort throughout. Pt made good progress towards goals this session including ability to stand and ambulate in the room without adverse symptoms and with SpO2 and HR WNL.  Pt fatigued quickly in standing with max static standing time of 70 sec and max amb distance of 15 feet but was generally steady without overt LOB.  Pt will benefit from PT services in a SNF setting upon discharge to safely address deficits listed in patient problem list for decreased caregiver assistance and eventual return to PLOF.     Recommendations for follow up therapy are one component of a multi-disciplinary discharge planning process, led by the attending physician.  Recommendations may be updated based on patient status, additional functional criteria and insurance authorization.  Follow Up Recommendations  Skilled nursing-short term rehab (<3 hours/day) Can patient physically be transported by private vehicle: No   Assistance Recommended at Discharge Frequent or constant Supervision/Assistance  Patient can return home with the following Two people to help with bathing/dressing/bathroom;Two people to help with walking and/or transfers;Help with stairs or ramp for entrance;Assist for transportation   Equipment Recommendations  BSC/3in1    Recommendations  for Other Services       Precautions / Restrictions Precautions Precautions: Fall Restrictions Weight Bearing Restrictions: No     Mobility  Bed Mobility Overal bed mobility: Needs Assistance Bed Mobility: Rolling, Sidelying to Sit, Sit to Sidelying Rolling: Min assist Sidelying to sit: Mod assist     Sit to sidelying: Mod assist General bed mobility comments: Mod A for BLE and trunk control with log roll training    Transfers Overall transfer level: Needs assistance Equipment used: Rolling walker (2 wheels) Transfers: Sit to/from Stand Sit to Stand: Min assist, From elevated surface           General transfer comment: Mod verbal cues for sequencing    Ambulation/Gait Ambulation/Gait assistance: Min guard Gait Distance (Feet): 15 Feet Assistive device: Rolling walker (2 wheels) Gait Pattern/deviations: Step-to pattern, Decreased step length - right, Decreased step length - left, Trunk flexed, Narrow base of support Gait velocity: decreased     General Gait Details: Pt able to ambulate with short B step length and slow cadence 15 feet but was steady without buckling or LOB   Stairs             Wheelchair Mobility    Modified Rankin (Stroke Patients Only)       Balance Overall balance assessment: Needs assistance   Sitting balance-Leahy Scale: Fair     Standing balance support: Bilateral upper extremity supported, During functional activity, Reliant on assistive device for balance Standing balance-Leahy Scale: Poor                              Cognition Arousal/Alertness: Awake/alert Behavior During Therapy: WFL for tasks assessed/performed Overall Cognitive Status: Within Functional Limits for tasks assessed  Exercises Total Joint Exercises Ankle Circles/Pumps: Strengthening, Both, 5 reps, 10 reps (with manual resistance) Quad Sets: Strengthening, Both, 10 reps Gluteal  Sets: Strengthening, Both, 10 reps Heel Slides: Strengthening, Both, 5 reps, AAROM Long Arc Quad: Strengthening, Both, 10 reps Knee Flexion: Strengthening, Both, 10 reps Static standing at EOB for improved activity tolerance x max time of 70 sec    General Comments        Pertinent Vitals/Pain Pain Assessment Pain Assessment: No/denies pain    Home Living                          Prior Function            PT Goals (current goals can now be found in the care plan section) Progress towards PT goals: Progressing toward goals    Frequency    Min 2X/week      PT Plan Current plan remains appropriate    Co-evaluation              AM-PAC PT "6 Clicks" Mobility   Outcome Measure  Help needed turning from your back to your side while in a flat bed without using bedrails?: A Lot Help needed moving from lying on your back to sitting on the side of a flat bed without using bedrails?: A Lot Help needed moving to and from a bed to a chair (including a wheelchair)?: A Lot Help needed standing up from a chair using your arms (e.g., wheelchair or bedside chair)?: A Lot Help needed to walk in hospital room?: A Lot Help needed climbing 3-5 steps with a railing? : Total 6 Click Score: 11    End of Session Equipment Utilized During Treatment: Gait belt Activity Tolerance: Patient tolerated treatment well Patient left: in bed;with call bell/phone within reach;with bed alarm set Nurse Communication: Mobility status PT Visit Diagnosis: Muscle weakness (generalized) (M62.81);Difficulty in walking, not elsewhere classified (R26.2)     Time: 2482-5003 PT Time Calculation (min) (ACUTE ONLY): 30 min  Charges:  $Gait Training: 8-22 mins $Therapeutic Exercise: 8-22 mins                     D. Scott Sachiko Methot PT, DPT 05/23/22, 3:49 PM

## 2022-05-23 NOTE — Telephone Encounter (Signed)
I spoke with Jenny Reichmann who was unaware that patient has been readmitted and still needs order signed for the initial nursing assessment that was done. She is going to fax an order to e signed

## 2022-05-23 NOTE — Telephone Encounter (Signed)
Call from [facility] asking if we will sign orders for this patient currently in the hospital. Please advise

## 2022-05-23 NOTE — Progress Notes (Addendum)
Patient was seen evaluated and examined by me and the PA on 05/23/22.  Course of action, evaluation, and management decisions were developed solely by me, but detailed below in the PA's note.  [facility] Cardiology  CARDIOLOGY CONSULT NOTE  Patient ID: Christopher [name] MRN: [id number] DOB/AGE: 08/10/1927 88 y.o.  Admit date: 05/19/2022 Referring Physician [name] Primary Physician [facility] Primary Cardiologist Fath / Paraschos Reason for Consultation atrial fibrillation/atrial flutter with rapid ventricular rate  HPI: 88 year old gentleman referred for evaluation of atrial fibrillation/atrial flutter with rapid ventricular rate.  Patient has known history of metastatic stage IV bladder cancer.  He presented to oncology office on 05/19/2022 for Keytruda infusion at which time he was noted to be hypotensive.  Heart rate was 82 bpm.  Patient was sent to [facility] ED where he was noted to have elevated white count with stage II pressure ulcer.  Patient was started on intravenous biotics and was admitted for further evaluation and management.  To low blood pressure, Cardizem CD was held.  The patient has had intermittent episodes of atrial flutter with rapid ventricular rate with 2-1 conduction telemetry.  Patient appears to be asymptomatic, denies chest pain, shortness of breath, or patient's.  The patient was started on diltiazem drip, currently is in atrial fibrillation at a rate of 80 bpm.  Patient has known history of sick sinus syndrome, status post dual-chamber pacemaker.  Interval History: -Feels okay today, denies chest pain, shortness of breath, abdominal pain.  Only complaint is some soreness where his pressure ulcer is. -Heart rate much better control with IV amiodarone, remains in atrial fib versus flutter with rate in the 90s to low 100s throughout the day -Pending dispo to SNF   Review of systems complete and found to be negative unless listed above     Past Medical History:  Diagnosis Date    A-fib ([place])    Anemia    Arthritis    BACK   Bladder cancer (HCC)    BPH (benign prostatic hyperplasia)    Cancer ([place])    skin cancer on face carcinoma   ED (erectile dysfunction)    Rosanna Randy disease    HLD (hyperlipidemia)    Hypertension    Macular degeneration    Mollusca contagiosa    Osteopenia    Presence of permanent cardiac pacemaker    Sensorineural hearing loss    Squamous cell carcinoma of skin 08/25/2019   R cheek    Squamous cell carcinoma of skin 02/12/2020   L neck    Squamous cell carcinoma of skin 11/30/2020   Right elbow, [facility]   Squamous cell carcinoma of skin 03/16/2021   right base of thumb [facility]   Squamous cell carcinoma of skin 03/16/2021   right preauricular - [facility] 11/17/21   Squamous cell carcinoma of skin 10/05/2021   Right dorsum hand - [facility]   Squamous cell carcinoma of skin 11/22/2021   right inf preauricular anterior, [facility]   Squamous cell carcinoma of skin 11/22/2021   right inf preauricular posterior, [facility]    Past Surgical History:  Procedure Laterality Date   CATARACT EXTRACTION Bilateral    CYSTOSCOPY WITH BIOPSY N/A 01/20/2020   Procedure: CYSTOSCOPY WITH BIOPSY;  Surgeon: Abbie Sons, MD;  Location: [facility] ORS;  Service: Urology;  Laterality: N/A;   CYSTOSCOPY WITH FULGERATION N/A 01/20/2020   Procedure: CYSTOSCOPY WITH FULGERATION;  Surgeon: Abbie Sons, MD;  Location: [facility] ORS;  Service: Urology;  Laterality: N/A;   CYSTOSCOPY WITH FULGERATION N/A  04/19/2020   Procedure: [place] with clot evacuation;  Surgeon: Abbie Sons, MD;  Location: [facility] ORS;  Service: Urology;  Laterality: N/A;   CYSTOSCOPY WITH FULGERATION N/A 01/05/2021   Procedure: CYSTOSCOPY WITH FULGERATION;  Surgeon: Abbie Sons, MD;  Location: [facility] ORS;  Service: Urology;  Laterality: N/A;   HEMORRHOIDECTOMY WITH HEMORRHOID BANDING     KNEE ARTHROSCOPY Left    PACEMAKER INSERTION N/A 03/03/2015   Procedure: INSERTION DUAL LEAD PACEMAKER;   Surgeon: Isaias Cowman, MD;  Location: [facility] ORS;  Service: Cardiovascular;  Laterality: N/A;   PULSE GENERATOR IMPLANT N/A 10/18/2020   Procedure: UNILATERAL PULSE GENERATOR IMPLANT;  Surgeon: [name], MD;  Location: [facility] ORS;  Service: Neurosurgery;  Laterality: N/A;   SPINAL CORD STIMULATOR TRIAL N/A 10/11/2020   Procedure: PERCUTANEOUS SPINAL CORD STIMULATOR TRIAL;  Surgeon: [name], MD;  Location: [facility] ORS;  Service: Neurosurgery;  Laterality: N/A;   THORACIC LAMINECTOMY FOR SPINAL CORD STIMULATOR N/A 10/18/2020   Procedure: PERCUTANEOUS LEAD PLACEMENT;  Surgeon: [name], MD;  Location: [facility] ORS;  Service: Neurosurgery;  Laterality: N/A;   TONSILLECTOMY     TRANSURETHRAL RESECTION OF BLADDER TUMOR N/A 10/07/2019   Procedure: TRANSURETHRAL RESECTION OF BLADDER TUMOR (TURBT);  Surgeon: Abbie Sons, MD;  Location: [facility] ORS;  Service: Urology;  Laterality: N/A;   TRANSURETHRAL RESECTION OF BLADDER TUMOR N/A 04/13/2020   Procedure: TRANSURETHRAL RESECTION OF BLADDER TUMOR (TURBT);  Surgeon: Abbie Sons, MD;  Location: [facility] ORS;  Service: Urology;  Laterality: N/A;    Medications Prior to Admission  Medication Sig Dispense Refill Last Dose   atorvastatin (LIPITOR) 10 MG tablet Take 10 mg by mouth in the morning.   05/19/2022   calcium carbonate (OSCAL) 1500 (600 Ca) MG TABS tablet Take 600 mg of elemental calcium by mouth in the morning.   05/19/2022   Cholecalciferol (VITAMIN D3) 50 MCG (2000 UT) TABS Take 2,000 Units by mouth in the morning.   05/19/2022   dexamethasone (DECADRON) 2 MG tablet Take 1 tablet (2 mg total) by mouth daily. 7 tablet 0 05/18/2022   diltiazem (CARDIZEM CD) 240 MG 24 hr capsule Take 1 capsule (240 mg total) by mouth daily. 30 capsule 0 05/19/2022   doxycycline (VIBRAMYCIN) 100 MG capsule Take by mouth.   05/18/2022   ELIQUIS 5 MG TABS tablet Take 1 tablet by mouth 2 (two) times daily.   05/19/2022 at 1000   ferrous sulfate 325 (65 FE) MG tablet  Take 325 mg by mouth in the morning and at bedtime.   05/19/2022 at 1000   finasteride (PROSCAR) 5 MG tablet TAKE 1 TABLET DAILY (Patient taking differently: Take 5 mg by mouth daily.) 90 tablet 3 05/19/2022   HYDROcodone-acetaminophen (NORCO/VICODIN) 5-325 MG tablet Take 1-2 tablets by mouth every 4 (four) hours as needed for moderate pain. 60 tablet 0 05/19/2022 at 1000   metoprolol tartrate (LOPRESSOR) 25 MG tablet Take 25 mg by mouth 2 (two) times daily.      Multiple Vitamin (MULTIVITAMIN WITH MINERALS) TABS tablet Take 1 tablet by mouth in the morning.   05/19/2022   Multiple Vitamins-Minerals (PRESERVISION AREDS 2 PO) Take 1 tablet by mouth in the morning and at bedtime.   05/19/2022   Omega-3 Fatty Acids (FISH OIL) 1000 MG CAPS Take 1,000 mg by mouth daily.   05/19/2022 at 1000   tamsulosin (FLOMAX) 0.4 MG CAPS capsule Take 0.4 mg by mouth daily.   05/19/2022   metoprolol tartrate (LOPRESSOR) 50  MG tablet Take 1 tablet (50 mg total) by mouth 2 (two) times daily. (Patient not taking: Reported on 05/19/2022) 60 tablet 0 Not Taking   mirtazapine (REMERON) 15 MG tablet Take 15 mg by mouth at bedtime. (Patient not taking: Reported on 05/19/2022)   Not Taking   naloxone (NARCAN) nasal spray 4 mg/0.1 mL SPRAY 1 SPRAY INTO ONE NOSTRIL AS DIRECTED FOR OPIOID OVERDOSE (TURN PERSON ON SIDE AFTER DOSE. IF NO RESPONSE IN 2-3 MINUTES OR PERSON RESPONDS BUT RELAPSES, REPEAT USING A NEW SPRAY DEVICE AND SPRAY INTO THE OTHER NOSTRIL. CALL 911 AFTER USE.) * EMERGENCY USE ONLY * (Patient not taking: Reported on 05/11/2022) 1 each 0    Social History   Socioeconomic History   Marital status: Widowed    Spouse name: Not on file   Number of children: Not on file   Years of education: Not on file   Highest education level: Not on file  Occupational History   Not on file  Tobacco Use   Smoking status: Never   Smokeless tobacco: Never  Vaping Use   Vaping Use: Never used  Substance and Sexual Activity    Alcohol use: Yes    Alcohol/week: 7.0 standard drinks of alcohol    Types: 7 Glasses of wine per week    Comment: DAILY    Drug use: No   Sexual activity: Yes    Birth control/protection: None  Other Topics Concern   Not on file  Social History Narrative   Retd. Civil engineer, contracting; [employer]; self. Never smoked; alcohol- glass wine/drink every night.     Social Determinants of Health   Financial Resource Strain: Not on file  Food Insecurity: No Food Insecurity (05/19/2022)   Hunger Vital Sign    Worried About Running Out of Food in the Last Year: Never true    Ran Out of Food in the Last Year: Never true  Transportation Needs: No Transportation Needs (05/19/2022)   PRAPARE - Hydrologist (Medical): No    Lack of Transportation (Non-Medical): No  Physical Activity: Not on file  Stress: Not on file  Social Connections: Not on file  Intimate Partner Violence: Not At Risk (05/19/2022)   Humiliation, Afraid, Rape, and Kick questionnaire    Fear of Current or Ex-Partner: No    Emotionally Abused: No    Physically Abused: No    Sexually Abused: No    Family History  Problem Relation Age of Onset   Diabetes Mother    Lung cancer Father    Cancer Paternal Uncle    Prostate cancer Neg Hx    Bladder Cancer Neg Hx    Kidney cancer Neg Hx      PHYSICAL EXAM  General: Pleasant elderly caucasian male, well developed, well nourished, in no acute distress.  Laying flat in PCU bed HEENT:  Normocephalic and atraumatic Neck:  No JVD.  Lungs: Normal respiratory effort on room air.  Clear bilaterally to auscultation without appreciable crackles or wheezes Heart: irregularly irregular rhythm with controlled rate. Normal S1 and S2 without gallops or murmurs.  Abdomen: Nondistended appearing Msk:  Normal strength and tone for age. Extremities: No clubbing, cyanosis.  Trace bilateral peripheral edema.   Neuro: Alert and oriented X 3. Psych:  Good affect, responds  appropriately  Labs:   Lab Results  Component Value Date   WBC 9.3 05/23/2022   HGB 10.9 (L) 05/23/2022   HCT 33.7 (L) 05/23/2022   MCV 91.3 05/23/2022  PLT 315 05/23/2022    Recent Labs  Lab 05/20/22 0516 05/21/22 0412 05/23/22 0534  NA 135   < > 136  K 4.1   < > 3.7  CL 102   < > 100  CO2 29   < > 29  BUN 16   < > 17  CREATININE 0.59*   < > 0.63  CALCIUM 8.3*   < > 8.8*  PROT 5.1*  --   --   BILITOT 0.7  --   --   ALKPHOS 107  --   --   ALT 41  --   --   AST 31  --   --   GLUCOSE 91   < > 127*   < > = values in this interval not displayed.    Lab Results  Component Value Date   TROPONINI <0.03 05/18/2018   No results found for: "CHOL" No results found for: "HDL" No results found for: "[place]" No results found for: "TRIG" No results found for: "CHOLHDL" No results found for: "LDLDIRECT"    Radiology: [facility] [place] Chest Port 1 View  Result Date: 05/19/2022 CLINICAL DATA:  Questionable sepsis, evaluate for abnormality. EXAM: PORTABLE CHEST 1 VIEW COMPARISON:  Chest radiograph dated January 16, 2021 FINDINGS: The heart size and mediastinal contours are within normal limits. Prominent atherosclerotic calcification of the aortic arch. Pacemaker leads in the right atrium and right ventricle. Lungs are clear without evidence of focal consolidation or pleural effusion. Spinal stimulator leads projecting over the lower thoracic spine. Osteopenia and thoracic spondylosis. IMPRESSION: No focal consolidation or large pleural effusion. Additional chronic findings as above. Electronically Signed   By: Keane Police D.O.   On: 05/19/2022 13:00    EKG: Atrial fibrillation 80 bpm  Telemetry reviewed by me: Overnight, A-fib/flutter rate 60s to 70s with occasional paced beats, throughout the morning of 11/1490s-low 100s in A-fib/flutter with short paroxysms to the 130s.  Data reviewed by me: Cross cover note hospitalist progress note, CBC, BMP, vitals, telemetry, EKGs  ASSESSMENT AND PLAN:    1.  Paroxysmal atrial fibrillation/atrial flutter, with intermittent rapid ventricular rate, likely compensatory for underlying condition, exacerbated by holding home dose of Cardizem CD 240 mg daily, initial rate controlled on diltiazem drip, required IV amiodarone on 11/13. 2.  Status post dual-chamber pacemaker for sick sinus syndrome 3.  Metastatic stage IV bladder cancer on Keytruda 4.  Pressure ulcer 5.  Hypotension, improved after IV fluids  Recommendations  1.  Agree with current therapy 2.  Continue apixaban for stroke prevention 3.  Continue metoprolol tartrate 50 mg twice daily 4.  Uptitrate Cardizem CD from 180 mg once a day to 240 mg once a day on 11/15 5.  S/p IV amiodarone bolus, and continuous infusion for 24 hours with better rate control. Transition to p.o. amiodarone 200 mg twice daily x 5 days, then 200 mg daily thereafter for an attempt at rate and rhythm control  This patient's plan of care was discussed and created with Dr. Clayborn Bigness and he is in agreement.    Signed: Alanson Puls Tang PA-C 05/23/2022, 8:32 AM

## 2022-05-23 NOTE — TOC Progression Note (Addendum)
Transition of Care [facility]) - Progression Note    Patient Details  Name: Christopher [name] MRN: [id number] Date of Birth: January 06, 1928  Transition of Care [facility]) CM/SW [place], LCSW Phone Number: 05/23/2022, 10:55 AM  Clinical Narrative:  CMA will start insurance authorization for discharge to [facility] tomorrow if stable.   2:52 pm: Updated patient and daughter.  Expected Discharge Plan: [place] Barriers to Discharge: Continued Medical Work up  Expected Discharge Plan and Services Expected Discharge Plan: [place] Choice: [place] arrangements for the past 2 months: Single Family Home                                       Social Determinants of Health (SDOH) Interventions    Readmission Risk Interventions     No data to display

## 2022-05-23 NOTE — Telephone Encounter (Signed)
Eventually if patient goes home with home health- we can sign it

## 2022-05-24 DIAGNOSIS — E440 Moderate protein-calorie malnutrition: Secondary | ICD-10-CM

## 2022-05-24 DIAGNOSIS — I4892 Unspecified atrial flutter: Secondary | ICD-10-CM | POA: Diagnosis not present

## 2022-05-24 DIAGNOSIS — I959 Hypotension, unspecified: Secondary | ICD-10-CM | POA: Diagnosis not present

## 2022-05-24 LAB — CBC
HCT: 34 % — ABNORMAL LOW (ref 39.0–52.0)
Hemoglobin: 10.8 g/dL — ABNORMAL LOW (ref 13.0–17.0)
MCH: 29.3 pg (ref 26.0–34.0)
MCHC: 31.8 g/dL (ref 30.0–36.0)
MCV: 92.4 fL (ref 80.0–100.0)
Platelets: 299 10*3/uL (ref 150–400)
RBC: 3.68 MIL/uL — ABNORMAL LOW (ref 4.22–5.81)
RDW: 14.6 % (ref 11.5–15.5)
WBC: 8.1 10*3/uL (ref 4.0–10.5)
nRBC: 0 % (ref 0.0–0.2)

## 2022-05-24 LAB — CULTURE, BLOOD (SINGLE)
Culture: NO GROWTH
Culture: NO GROWTH
Special Requests: ADEQUATE
Special Requests: ADEQUATE

## 2022-05-24 LAB — BASIC METABOLIC PANEL
Anion gap: 7 (ref 5–15)
BUN: 23 mg/dL (ref 8–23)
CO2: 30 mmol/L (ref 22–32)
Calcium: 9 mg/dL (ref 8.9–10.3)
Chloride: 102 mmol/L (ref 98–111)
Creatinine, Ser: 0.61 mg/dL (ref 0.61–1.24)
GFR, Estimated: >60 mL/min (ref >60)
Glucose, Bld: 106 mg/dL — ABNORMAL HIGH (ref 70–99)
Potassium: 4 mmol/L (ref 3.5–5.1)
Sodium: 139 mmol/L (ref 135–145)

## 2022-05-24 LAB — MAGNESIUM: Magnesium: 2.2 mg/dL (ref 1.7–2.4)

## 2022-05-24 MED ORDER — METOPROLOL TARTRATE 5 MG/5ML IV SOLN: 5.0000 mg | Freq: Four times a day (QID) | INTRAVENOUS | Status: DC | PRN

## 2022-05-24 NOTE — Progress Notes (Signed)
Physical Therapy Treatment Patient Details Name: Christopher [name] MRN: [id number] DOB: [birth year] Today's Date: 05/24/2022   History of Present Illness Christopher [name] is a 88 y.o. male with medical history significant of stage IV metastatic bladder cancer on Keytruda, Gilbert disease, hypertension, hyperlipidemia, atrial fibrillation, who presents to the ED with hypotension and leukocytosis. MD assessment includes; Atrial flutter with rapid ventricular response, hypotension, leukocytosis, pressure ulcer of coccygeal region, stage 2, and moderate malnutrition.    PT Comments    Pt was pleasant and motivated to participate during the session and put forth good effort throughout. Pt required physical assistance with all functional tasks but grossly less so than during prior session and pt was able to increase his max amb distance to 20 feet this session.  Pt reported no adverse symptoms during the session with HR typically in the 70s to 80s but briefly increased to the 120's with ambulation.  SpO2 WNL on room air.  Pt will benefit from PT services in a SNF setting upon discharge to safely address deficits listed in patient problem list for decreased caregiver assistance and eventual return to PLOF.    Recommendations for follow up therapy are one component of a multi-disciplinary discharge planning process, led by the attending physician.  Recommendations may be updated based on patient status, additional functional criteria and insurance authorization.  Follow Up Recommendations  Skilled nursing-short term rehab (<3 hours/day) Can patient physically be transported by private vehicle: No   Assistance Recommended at Discharge Frequent or constant Supervision/Assistance  Patient can return home with the following Help with stairs or ramp for entrance;Assist for transportation;A lot of help with walking and/or transfers;A lot of help with bathing/dressing/bathroom   Equipment Recommendations   BSC/3in1    Recommendations for Other Services       Precautions / Restrictions Precautions Precautions: Fall Precaution Comments: monitor HR Restrictions Weight Bearing Restrictions: No     Mobility  Bed Mobility Overal bed mobility: Needs Assistance Bed Mobility: Supine to Sit, Sit to Supine, Rolling Rolling: Min assist Sidelying to sit: Mod assist     Sit to sidelying: Mod assist General bed mobility comments: Mod A for BLE and trunk control with log roll training/review    Transfers Overall transfer level: Needs assistance Equipment used: Rolling walker (2 wheels) Transfers: Sit to/from Stand Sit to Stand: From elevated surface, Mod assist           General transfer comment: Mod verbal cues for sequencing    Ambulation/Gait Ambulation/Gait assistance: Min guard Gait Distance (Feet): 20 Feet Assistive device: Rolling walker (2 wheels) Gait Pattern/deviations: Step-to pattern, Decreased step length - right, Decreased step length - left, Trunk flexed, Narrow base of support Gait velocity: decreased     General Gait Details: Slow cadence with short B step length and mod lean on the RW for support but steady without LOB or buckling   Stairs             Wheelchair Mobility    Modified Rankin (Stroke Patients Only)       Balance Overall balance assessment: Needs assistance Sitting-balance support: Feet supported, No upper extremity supported Sitting balance-Leahy Scale: Fair Sitting balance - Comments: Pt requires assistance to achieve stability in upright sitting initially but then able to maintain static sitting balance withour assist   Standing balance support: Bilateral upper extremity supported, During functional activity, Reliant on assistive device for balance Standing balance-Leahy Scale: Fair  Cognition Arousal/Alertness: Awake/alert Behavior During Therapy: WFL for tasks  assessed/performed Overall Cognitive Status: Within Functional Limits for tasks assessed                                 General Comments: HOH        Exercises Total Joint Exercises Ankle Circles/Pumps: Strengthening, Both, 5 reps, 10 reps (with manual resistance) Quad Sets: Strengthening, Both, 10 reps Gluteal Sets: Strengthening, Both, 10 reps Heel Slides: Strengthening, Both, 5 reps, AAROM Straight Leg Raises: AAROM, Strengthening, Both, 5 reps Long Arc Quad: Strengthening, Both, 10 reps Knee Flexion: Strengthening, Both, 10 reps Other Exercises Other Exercises: Log roll training    General Comments        Pertinent Vitals/Pain Pain Assessment Pain Assessment: No/denies pain    Home Living                          Prior Function            PT Goals (current goals can now be found in the care plan section) Progress towards PT goals: Progressing toward goals    Frequency    Min 2X/week      PT Plan Current plan remains appropriate    Co-evaluation              AM-PAC PT "6 Clicks" Mobility   Outcome Measure  Help needed turning from your back to your side while in a flat bed without using bedrails?: A Lot Help needed moving from lying on your back to sitting on the side of a flat bed without using bedrails?: A Lot Help needed moving to and from a bed to a chair (including a wheelchair)?: A Lot Help needed standing up from a chair using your arms (e.g., wheelchair or bedside chair)?: A Lot Help needed to walk in hospital room?: A Lot Help needed climbing 3-5 steps with a railing? : Total 6 Click Score: 11    End of Session Equipment Utilized During Treatment: Gait belt Activity Tolerance: Patient tolerated treatment well Patient left: in bed;with call bell/phone within reach;with bed alarm set;Other (comment) (Pt left in L sidelying as found for pressure relief) Nurse Communication: Mobility status PT Visit Diagnosis:  Muscle weakness (generalized) (M62.81);Difficulty in walking, not elsewhere classified (R26.2)     Time: 7824-2353 PT Time Calculation (min) (ACUTE ONLY): 31 min  Charges:  $Gait Training: 8-22 mins $Therapeutic Exercise: 8-22 mins                     D. Scott Marylyn Appenzeller PT, DPT 05/24/22, 4:11 PM

## 2022-05-24 NOTE — Evaluation (Signed)
Occupational Therapy Evaluation Patient Details Name: Christopher [name] MRN: [id number] DOB: Feb 06, 1928 Today's Date: 05/24/2022   History of Present Illness Christopher [name] is a 88 y.o. male with medical history significant of stage IV metastatic bladder cancer on Keytruda, Gilbert disease, hypertension, hyperlipidemia, atrial fibrillation, who presents to the ED with hypotension and leukocytosis. MD assessment includes; Atrial flutter with rapid ventricular response, hypotension, leukocytosis, pressure ulcer of coccygeal region, stage 2, and moderate malnutrition.   Clinical Impression   Patient presenting with decreased independence in self-care, functional mobility, safety, balance, strength, and endurance. Patient reports he lives at home with his daughter who assists him with all ADLs and IADLs. Patient also reports he ambulates limited distances in the home using RW. Patient has a shower bench, regular toilet but at baseline voids in brief and daughter assists him. Patient currently functioning at [facility] A for bed mobility. HR elevated 149 bpm while sitting in bed, monitored throughout session, nurse notified. Patient requires max A for stand pivot into recliner. Patient with c/o bed sore pain and back pain. Patient left in bed with call bell in reach, bed alarm set, breakfast tray set up, and all needs met. Patient will benefit from acute OT to increase overall independence in the areas of ADLs, functional mobility, in order to safely discharge to the next venue of care.       Recommendations for follow up therapy are one component of a multi-disciplinary discharge planning process, led by the attending physician.  Recommendations may be updated based on patient status, additional functional criteria and insurance authorization.   Follow Up Recommendations  Skilled nursing-short term rehab (<3 hours/day)     Assistance Recommended at Discharge Intermittent Supervision/Assistance  Patient  can return home with the following A little help with walking and/or transfers;A little help with bathing/dressing/bathroom;Assist for transportation;Help with stairs or ramp for entrance;Assistance with cooking/housework;Direct supervision/assist for financial management;Direct supervision/assist for medications management    Functional Status Assessment  Patient has had a recent decline in their functional status and demonstrates the ability to make significant improvements in function in a reasonable and predictable amount of time.  Equipment Recommendations  Other (comment) (Defer to next venue of care)       Precautions / Restrictions Precautions Precautions: Fall Precaution Comments: monitor HR Restrictions Weight Bearing Restrictions: No      Mobility Bed Mobility Overal bed mobility: Needs Assistance Bed Mobility: Supine to Sit   Sidelying to sit: Max assist            Transfers Overall transfer level: Needs assistance Equipment used:  (gait belt) Transfers: Bed to chair/wheelchair/BSC   Stand pivot transfers: Max assist         General transfer comment: Mod verbal cues for sequencing      Balance Overall balance assessment: Needs assistance Sitting-balance support: Feet supported, No upper extremity supported Sitting balance-Leahy Scale: Fair Sitting balance - Comments: Needs mod VC for hand placement on railing and mod A to achieve static sitting balance. He only requires CGA when in static sitting position   Standing balance support: Bilateral upper extremity supported, During functional activity, Reliant on assistive device for balance Standing balance-Leahy Scale: Poor                             ADL either performed or assessed with clinical judgement   ADL Overall ADL's : Needs assistance/impaired  Lower Body Bathing: Minimal assistance;Moderate assistance Lower Body Bathing Details (indicate cue type and reason): Per  patient patient requires assistance for LB bathing Upper Body Dressing : Minimal assistance;Moderate assistance Upper Body Dressing Details (indicate cue type and reason): Per patient reports requires assistance for UB dressing. Lower Body Dressing: Moderate assistance;Sitting/lateral leans;Minimal assistance Lower Body Dressing Details (indicate cue type and reason): per patient report patient requires assistance for LB dressing                     Vision Patient Visual Report: No change from baseline              Pertinent Vitals/Pain Pain Assessment Pain Assessment: Faces Faces Pain Scale: Hurts little more Pain Location: back and buttocks Pain Descriptors / Indicators: Aching, Discomfort Pain Intervention(s): Limited activity within patient's tolerance, Premedicated before session, Monitored during session, Repositioned     Hand Dominance Right   Extremity/Trunk Assessment Upper Extremity Assessment Upper Extremity Assessment: Generalized weakness   Lower Extremity Assessment Lower Extremity Assessment: Generalized weakness       Communication Communication Communication: HOH   Cognition Arousal/Alertness: Awake/alert Behavior During Therapy: WFL for tasks assessed/performed Overall Cognitive Status: Within Functional Limits for tasks assessed                                 General Comments: [name] expects to be discharged to:: Private residence Living Arrangements: Children Available Help at Discharge: Family Type of Home: House Home Access: Stairs to enter   Entrance Stairs-Rails: None Home Layout: One level     Bathroom Shower/Tub: Teacher, early years/pre: Standard     Home Equipment: Conservation officer, nature (2 wheels);Shower seat          Prior Functioning/Environment Prior Level of Function : Needs assist       Physical Assist : Mobility (physical);ADLs (physical)   ADLs  (physical): Bathing;Dressing;Toileting;IADLs Mobility Comments: Very limited mobility. Walks to and from room to bathroom. Able to get up from bed independently and has a regular bed. When going to doctor's appointments uses transport chair ADLs Comments: Per patient report, patient requires assistance for toileting, dressing, bathing, and medication mamagement.        OT Problem List: Decreased strength;Decreased safety awareness;Decreased activity tolerance;Impaired balance (sitting and/or standing)      OT Treatment/Interventions: Self-care/ADL training;Therapeutic activities;Therapeutic exercise;Patient/family education    OT Goals(Current goals can be found in the care plan section) Acute Rehab OT Goals Patient Stated Goal: get better OT Goal Formulation: With patient Time For Goal Achievement: 06/07/22 Potential to Achieve Goals: Good ADL Goals Pt Will Perform Grooming: with supervision;sitting Pt Will Perform Lower Body Bathing: with min assist Pt Will Perform Lower Body Dressing: with min assist  OT Frequency: Min 2X/week       AM-PAC OT "6 Clicks" Daily Activity     Outcome Measure Help from another person eating meals?: None Help from another person taking care of personal grooming?: A Little Help from another person toileting, which includes using toliet, bedpan, or urinal?: A Lot Help from another person bathing (including washing, rinsing, drying)?: A Lot Help from another person to put on and taking off regular upper body clothing?: A Little Help from another person to put on and taking off regular lower body clothing?: A Lot 6  Click Score: 16   End of Session Equipment Utilized During Treatment: Gait belt Nurse Communication: Mobility status;Other (comment) (elevated HR, asymptomatic)  Activity Tolerance: Treatment limited secondary to medical complications (Comment) (elevated HR) Patient left: in chair;with call bell/phone within reach;with chair alarm set;with  family/visitor present  OT Visit Diagnosis: Muscle weakness (generalized) (M62.81);Unsteadiness on feet (R26.81)                Time: 1443-1540 OT Time Calculation (min): 32 min Charges:  OT General Charges $OT Visit: 1 Visit OT Evaluation $OT Eval Moderate Complexity: 1 Mod OT Treatments $Therapeutic Activity: 8-22 mins    Omkar Stratmann, OTS 05/24/2022, 10:39 AM

## 2022-05-24 NOTE — Care Management Important Message (Signed)
Important Message  Patient Details  Name: Christopher [name] MRN: [id number] Date of Birth: Jun 26, 1928   Medicare Important Message Given:  Yes     [name] 05/24/2022, 12:31 PM

## 2022-05-24 NOTE — Progress Notes (Signed)
[facility] Cardiology  CARDIOLOGY CONSULT NOTE  Patient ID: Christopher [name] MRN: [id number] DOB/AGE: Sep 24, 1927 88 y.o.  Admit date: 05/19/2022 Referring Physician [name] Primary Physician [facility] Primary Cardiologist Fath / Paraschos Reason for Consultation atrial fibrillation/atrial flutter with rapid ventricular rate  HPI: 88 year old gentleman referred for evaluation of atrial fibrillation/atrial flutter with rapid ventricular rate.  Patient has known history of metastatic stage IV bladder cancer.  He presented to oncology office on 05/19/2022 for Keytruda infusion at which time he was noted to be hypotensive.  Heart rate was 82 bpm.  Patient was sent to [facility] ED where he was noted to have elevated white count with stage II pressure ulcer.  Patient was started on intravenous biotics and was admitted for further evaluation and management.  To low blood pressure, Cardizem CD was held.  The patient has had intermittent episodes of atrial flutter with rapid ventricular rate with 2-1 conduction telemetry.  Patient appears to be asymptomatic, denies chest pain, shortness of breath, or patient's.  The patient was started on diltiazem drip, currently is in atrial fibrillation at a rate of 80 bpm.  Patient has known history of sick sinus syndrome, status post dual-chamber pacemaker.  Interval History: -Feels good today, sitting up in chair with his daughter at bedside.  No chest discomfort, palpitations or shortness of breath.  Still has intermittent soreness on his bottom where his pressure ulcer is. -Had another 20-minute period of A-fib with RVR, asymptomatic.   Review of systems complete and found to be negative unless listed above     Past Medical History:  Diagnosis Date   A-fib ([place])    Anemia    Arthritis    BACK   Bladder cancer (HCC)    BPH (benign prostatic hyperplasia)    Cancer (HCC)    skin cancer on face carcinoma   ED (erectile dysfunction)    Rosanna Randy disease    HLD  (hyperlipidemia)    Hypertension    Macular degeneration    Mollusca contagiosa    Osteopenia    Presence of permanent cardiac pacemaker    Sensorineural hearing loss    Squamous cell carcinoma of skin 08/25/2019   R cheek    Squamous cell carcinoma of skin 02/12/2020   L neck    Squamous cell carcinoma of skin 11/30/2020   Right elbow, [facility]   Squamous cell carcinoma of skin 03/16/2021   right base of thumb [facility]   Squamous cell carcinoma of skin 03/16/2021   right preauricular - [facility] 11/17/21   Squamous cell carcinoma of skin 10/05/2021   Right dorsum hand - [facility]   Squamous cell carcinoma of skin 11/22/2021   right inf preauricular anterior, [facility]   Squamous cell carcinoma of skin 11/22/2021   right inf preauricular posterior, [facility]    Past Surgical History:  Procedure Laterality Date   CATARACT EXTRACTION Bilateral    CYSTOSCOPY WITH BIOPSY N/A 01/20/2020   Procedure: CYSTOSCOPY WITH BIOPSY;  Surgeon: Abbie Sons, MD;  Location: [facility] ORS;  Service: Urology;  Laterality: N/A;   CYSTOSCOPY WITH FULGERATION N/A 01/20/2020   Procedure: CYSTOSCOPY WITH FULGERATION;  Surgeon: Abbie Sons, MD;  Location: [facility] ORS;  Service: Urology;  Laterality: N/A;   CYSTOSCOPY WITH FULGERATION N/A 04/19/2020   Procedure: [place] with clot evacuation;  Surgeon: Abbie Sons, MD;  Location: [facility] ORS;  Service: Urology;  Laterality: N/A;   CYSTOSCOPY WITH FULGERATION N/A 01/05/2021   Procedure: CYSTOSCOPY WITH FULGERATION;  Surgeon: Bernardo Heater,  Ronda Fairly, MD;  Location: [facility] ORS;  Service: Urology;  Laterality: N/A;   HEMORRHOIDECTOMY WITH HEMORRHOID BANDING     KNEE ARTHROSCOPY Left    PACEMAKER INSERTION N/A 03/03/2015   Procedure: INSERTION DUAL LEAD PACEMAKER;  Surgeon: Isaias Cowman, MD;  Location: [facility] ORS;  Service: Cardiovascular;  Laterality: N/A;   PULSE GENERATOR IMPLANT N/A 10/18/2020   Procedure: UNILATERAL PULSE GENERATOR IMPLANT;  Surgeon: [name], MD;   Location: [facility] ORS;  Service: Neurosurgery;  Laterality: N/A;   SPINAL CORD STIMULATOR TRIAL N/A 10/11/2020   Procedure: PERCUTANEOUS SPINAL CORD STIMULATOR TRIAL;  Surgeon: [name], MD;  Location: [facility] ORS;  Service: Neurosurgery;  Laterality: N/A;   THORACIC LAMINECTOMY FOR SPINAL CORD STIMULATOR N/A 10/18/2020   Procedure: PERCUTANEOUS LEAD PLACEMENT;  Surgeon: [name], MD;  Location: [facility] ORS;  Service: Neurosurgery;  Laterality: N/A;   TONSILLECTOMY     TRANSURETHRAL RESECTION OF BLADDER TUMOR N/A 10/07/2019   Procedure: TRANSURETHRAL RESECTION OF BLADDER TUMOR (TURBT);  Surgeon: Abbie Sons, MD;  Location: [facility] ORS;  Service: Urology;  Laterality: N/A;   TRANSURETHRAL RESECTION OF BLADDER TUMOR N/A 04/13/2020   Procedure: TRANSURETHRAL RESECTION OF BLADDER TUMOR (TURBT);  Surgeon: Abbie Sons, MD;  Location: [facility] ORS;  Service: Urology;  Laterality: N/A;    Medications Prior to Admission  Medication Sig Dispense Refill Last Dose   atorvastatin (LIPITOR) 10 MG tablet Take 10 mg by mouth in the morning.   05/19/2022   calcium carbonate (OSCAL) 1500 (600 Ca) MG TABS tablet Take 600 mg of elemental calcium by mouth in the morning.   05/19/2022   Cholecalciferol (VITAMIN D3) 50 MCG (2000 UT) TABS Take 2,000 Units by mouth in the morning.   05/19/2022   dexamethasone (DECADRON) 2 MG tablet Take 1 tablet (2 mg total) by mouth daily. 7 tablet 0 05/18/2022   diltiazem (CARDIZEM CD) 240 MG 24 hr capsule Take 1 capsule (240 mg total) by mouth daily. 30 capsule 0 05/19/2022   doxycycline (VIBRAMYCIN) 100 MG capsule Take by mouth.   05/18/2022   ELIQUIS 5 MG TABS tablet Take 1 tablet by mouth 2 (two) times daily.   05/19/2022 at 1000   ferrous sulfate 325 (65 FE) MG tablet Take 325 mg by mouth in the morning and at bedtime.   05/19/2022 at 1000   finasteride (PROSCAR) 5 MG tablet TAKE 1 TABLET DAILY (Patient taking differently: Take 5 mg by mouth daily.) 90 tablet 3 05/19/2022    HYDROcodone-acetaminophen (NORCO/VICODIN) 5-325 MG tablet Take 1-2 tablets by mouth every 4 (four) hours as needed for moderate pain. 60 tablet 0 05/19/2022 at 1000   metoprolol tartrate (LOPRESSOR) 25 MG tablet Take 25 mg by mouth 2 (two) times daily.      Multiple Vitamin (MULTIVITAMIN WITH MINERALS) TABS tablet Take 1 tablet by mouth in the morning.   05/19/2022   Multiple Vitamins-Minerals (PRESERVISION AREDS 2 PO) Take 1 tablet by mouth in the morning and at bedtime.   05/19/2022   Omega-3 Fatty Acids (FISH OIL) 1000 MG CAPS Take 1,000 mg by mouth daily.   05/19/2022 at 1000   tamsulosin (FLOMAX) 0.4 MG CAPS capsule Take 0.4 mg by mouth daily.   05/19/2022   metoprolol tartrate (LOPRESSOR) 50 MG tablet Take 1 tablet (50 mg total) by mouth 2 (two) times daily. (Patient not taking: Reported on 05/19/2022) 60 tablet 0 Not Taking   mirtazapine (REMERON) 15 MG tablet Take 15 mg by mouth at bedtime. (Patient not taking:  Reported on 05/19/2022)   Not Taking   naloxone (NARCAN) nasal spray 4 mg/0.1 mL SPRAY 1 SPRAY INTO ONE NOSTRIL AS DIRECTED FOR OPIOID OVERDOSE (TURN PERSON ON SIDE AFTER DOSE. IF NO RESPONSE IN 2-3 MINUTES OR PERSON RESPONDS BUT RELAPSES, REPEAT USING A NEW SPRAY DEVICE AND SPRAY INTO THE OTHER NOSTRIL. CALL 911 AFTER USE.) * EMERGENCY USE ONLY * (Patient not taking: Reported on 05/11/2022) 1 each 0    Social History   Socioeconomic History   Marital status: Widowed    Spouse name: Not on file   Number of children: Not on file   Years of education: Not on file   Highest education level: Not on file  Occupational History   Not on file  Tobacco Use   Smoking status: Never   Smokeless tobacco: Never  Vaping Use   Vaping Use: Never used  Substance and Sexual Activity   Alcohol use: Yes    Alcohol/week: 7.0 standard drinks of alcohol    Types: 7 Glasses of wine per week    Comment: DAILY    Drug use: No   Sexual activity: Yes    Birth control/protection: None  Other Topics  Concern   Not on file  Social History Narrative   Retd. Civil engineer, contracting; [place]; self. Never smoked; alcohol- glass wine/drink every night.     Social Determinants of Health   Financial Resource Strain: Not on file  Food Insecurity: No Food Insecurity (05/19/2022)   Hunger Vital Sign    Worried About Running Out of Food in the Last Year: Never true    Ran Out of Food in the Last Year: Never true  Transportation Needs: No Transportation Needs (05/19/2022)   PRAPARE - Hydrologist (Medical): No    Lack of Transportation (Non-Medical): No  Physical Activity: Not on file  Stress: Not on file  Social Connections: Not on file  Intimate Partner Violence: Not At Risk (05/19/2022)   Humiliation, Afraid, Rape, and Kick questionnaire    Fear of Current or Ex-Partner: No    Emotionally Abused: No    Physically Abused: No    Sexually Abused: No    Family History  Problem Relation Age of Onset   Diabetes Mother    Lung cancer Father    Cancer Paternal Uncle    Prostate cancer Neg Hx    Bladder Cancer Neg Hx    Kidney cancer Neg Hx      PHYSICAL EXAM  General: Pleasant elderly caucasian male, well developed, well nourished, in no acute distress.  Sitting upright in recliner daughter at bedside HEENT:  Normocephalic and atraumatic Neck:  No JVD.  Lungs: Normal respiratory effort on room air.  Clear bilaterally to auscultation without appreciable crackles or wheezes Heart: irregularly irregular rhythm with controlled rate. Normal S1 and S2 without gallops or murmurs.  Abdomen: Nondistended appearing Msk:  Normal strength and tone for age. Extremities: No clubbing, cyanosis.  Trace bilateral peripheral edema.   Neuro: Alert and oriented X 3. Psych:  Good affect, responds appropriately  Labs:   Lab Results  Component Value Date   WBC 8.1 05/24/2022   HGB 10.8 (L) 05/24/2022   HCT 34.0 (L) 05/24/2022   MCV 92.4 05/24/2022   PLT 299 05/24/2022     Recent Labs  Lab 05/20/22 0516 05/21/22 0412 05/24/22 0535  NA 135   < > 139  K 4.1   < > 4.0  CL 102   < >  102  CO2 29   < > 30  BUN 16   < > 23  CREATININE 0.59*   < > 0.61  CALCIUM 8.3*   < > 9.0  PROT 5.1*  --   --   BILITOT 0.7  --   --   ALKPHOS 107  --   --   ALT 41  --   --   AST 31  --   --   GLUCOSE 91   < > 106*   < > = values in this interval not displayed.    Lab Results  Component Value Date   TROPONINI <0.03 05/18/2018   No results found for: "CHOL" No results found for: "HDL" No results found for: "[place]" No results found for: "TRIG" No results found for: "CHOLHDL" No results found for: "LDLDIRECT"    Radiology: [facility] Chest Port 1 View  Result Date: 05/19/2022 CLINICAL DATA:  Questionable sepsis, evaluate for abnormality. EXAM: PORTABLE CHEST 1 VIEW COMPARISON:  Chest radiograph dated January 16, 2021 FINDINGS: The heart size and mediastinal contours are within normal limits. Prominent atherosclerotic calcification of the aortic arch. Pacemaker leads in the right atrium and right ventricle. Lungs are clear without evidence of focal consolidation or pleural effusion. Spinal stimulator leads projecting over the lower thoracic spine. Osteopenia and thoracic spondylosis. IMPRESSION: No focal consolidation or large pleural effusion. Additional chronic findings as above. Electronically Signed   By: Keane Police D.O.   On: 05/19/2022 13:00    EKG: Atrial fibrillation 80 bpm  Telemetry reviewed by me: Overnight, A-fib/flutter rate 60s to 70s with occasional paced beats, throughout the morning of 11/1490s-low 100s in A-fib/flutter with short paroxysms to the 130s.  Data reviewed by me: Cross cover note hospitalist progress note, CBC, BMP, vitals, telemetry, EKGs  ASSESSMENT AND PLAN:   1.  Paroxysmal atrial fibrillation/atrial flutter, with intermittent rapid ventricular rate, likely compensatory for underlying condition, exacerbated by holding home dose of Cardizem  CD 240 mg daily, initial rate controlled on diltiazem drip, required IV amiodarone on 11/13. 2.  Status post dual-chamber pacemaker for sick sinus syndrome 3.  Metastatic stage IV bladder cancer on Keytruda 4.  Pressure ulcer 5.  Hypotension, improved after IV fluids  Recommendations  1.  Agree with current therapy 2.  Continue apixaban for stroke prevention 3.  Continue metoprolol tartrate 50 mg twice daily 4.  Continue Cardizem CD 240 mg once a day  5.  S/p IV amiodarone bolus, and continuous infusion for 24 hours with better rate control.  Continue p.o. amiodarone 200 mg twice daily x 5 days, then 200 mg daily thereafter for an attempt at rate and rhythm control 6.  No further cardiac diagnostics necessary, if heart rate is stable later today, okay for discharge from a cardiac standpoint.  Can follow-up with Dr. Saralyn Pilar in 1 to 2 weeks.  Cardiology will sign off.  Please reengage if needed.  This patient's plan of care was discussed and created with Dr. Saralyn Pilar and he is in agreement.    Signed: Alanson Puls Stpehanie Montroy PA-C 05/24/2022, 9:58 AM

## 2022-05-24 NOTE — Progress Notes (Addendum)
  Progress Note   Patient: Christopher [name] EZM:[id number] DOB: Jan 12, 1928 DOA: 05/19/2022     3 DOS: the patient was seen and examined on 05/24/2022   Brief hospital course: Christopher [name] is a 88 y.o. male with medical history significant of stage IV metastatic bladder cancer on Keytruda, Gilbert disease, hypertension, hyperlipidemia, atrial fibrillation, who presents to the ED with hypotension. He presented to his oncologist's office for his planned infusion of Keytruda.  Vital signs obtained at that time demonstrated blood pressure of 78/56 with heart rate of 82.  He received IV fluids, by the time he came to the emergency room, blood pressure was elevated with significant tachycardia with atrial flutter.  He was seen by cardiology, increase metoprolol and a diltiazem, continued Eliquis.  Assessment and Plan: Atrial flutter with rapid ventricular response. Transient hypotension. Sepsis is ruled out. Blood pressure has been better since arriving the hospital.  His heart rate was better controlled, however heart rate went up to 148 today again, giving IV metoprolol, continued current dose of Cardizem and Lopressor.  Heart rate is better this afternoon.  At this point, I will keep patient overnight to make sure he does not have tachycardia again, most likely he will be discharged home tomorrow.  Pressure ulcer of coccygeal region, stage 2 ([place]) Follow-up with RN.   Urothelial carcinoma of bladder [facility]) Follow-up with oncology as outpatient.   Malnutrition of moderate degree Continue protein supplement.     Subjective:  Patient had an episode of tachycardia today, but the patient was asymptomatic.  Physical Exam: Vitals:   05/24/22 0500 05/24/22 0600 05/24/22 0835 05/24/22 1202  BP: 125/65  130/63 (!) 112/59  Pulse: 70  (!) 48 72  Resp:   20 16  Temp:  98 F (36.7 C) (!) 97.4 F (36.3 C) (!) 97.4 F (36.3 C)  TempSrc:  Oral Oral Oral  SpO2:   98% 98%  Weight:      Height:        General exam: Appears calm and comfortable  Respiratory system: Clear to auscultation. Respiratory effort normal. Cardiovascular system: Regular. No JVD, murmurs, rubs, gallops or clicks. No pedal edema. Gastrointestinal system: Abdomen is nondistended, soft and nontender. No organomegaly or masses felt. Normal bowel sounds heard. Central nervous system: Alert and oriented. No focal neurological deficits. Extremities: Symmetric 5 x 5 power. Skin: No rashes, lesions or ulcers Psychiatry: Judgement and insight appear normal. Mood & affect appropriate.   Data Reviewed:  EKG and telemetry reviewed, labs reviewed.  Family Communication:   Disposition: Status is: Inpatient Remains inpatient appropriate because: severity of disease  Planned Discharge Destination: Home with Home Health    Time spent: 35 minutes  Author: Sharen Hones, MD 05/24/2022 3:13 PM  For on call review [email or web address].

## 2022-05-24 NOTE — Hospital Course (Signed)
Christopher [name] is a 88 y.o. male with medical history significant of stage IV metastatic bladder cancer on Keytruda, Rosanna Randy disease, hypertension, hyperlipidemia, atrial fibrillation, who presents to the ED with hypotension. He presented to his oncologist's office for his planned infusion of Keytruda.  Vital signs obtained at that time demonstrated blood pressure of 78/56 with heart rate of 82.  He received IV fluids, by the time he came to the emergency room, blood pressure was elevated with significant tachycardia with atrial flutter.  He was seen by cardiology, increase metoprolol and a diltiazem, continued Eliquis.

## 2022-05-24 NOTE — TOC Progression Note (Addendum)
Transition of Care [facility]) - Progression Note    Patient Details  Name: Christopher [name] MRN: [id number] Date of Birth: 12/27/1927  Transition of Care [facility]) CM/SW Calvin, LCSW Phone Number: 05/24/2022, 9:15 AM  Clinical Narrative:  Josem Kaufmann still pending.   11:42 am: Insurance requesting updated clinicals. CMA sent in.  Expected Discharge Plan: [place] Barriers to Discharge: Continued Medical Work up  Expected Discharge Plan and Services Expected Discharge Plan: [place] Choice: [place] arrangements for the past 2 months: Single Family Home                                       Social Determinants of Health (SDOH) Interventions    Readmission Risk Interventions     No data to display

## 2022-05-25 DIAGNOSIS — C679 Malignant neoplasm of bladder, unspecified: Secondary | ICD-10-CM

## 2022-05-25 LAB — BASIC METABOLIC PANEL
Anion gap: 7 (ref 5–15)
BUN: 23 mg/dL (ref 8–23)
CO2: 29 mmol/L (ref 22–32)
Calcium: 8.9 mg/dL (ref 8.9–10.3)
Chloride: 102 mmol/L (ref 98–111)
Creatinine, Ser: 0.58 mg/dL — ABNORMAL LOW (ref 0.61–1.24)
GFR, Estimated: >60 mL/min (ref >60)
Glucose, Bld: 113 mg/dL — ABNORMAL HIGH (ref 70–99)
Potassium: 3.9 mmol/L (ref 3.5–5.1)
Sodium: 138 mmol/L (ref 135–145)

## 2022-05-25 LAB — CBC
HCT: 34.2 % — ABNORMAL LOW (ref 39.0–52.0)
Hemoglobin: 11 g/dL — ABNORMAL LOW (ref 13.0–17.0)
MCH: 29.7 pg (ref 26.0–34.0)
MCHC: 32.2 g/dL (ref 30.0–36.0)
MCV: 92.4 fL (ref 80.0–100.0)
Platelets: 285 10*3/uL (ref 150–400)
RBC: 3.7 MIL/uL — ABNORMAL LOW (ref 4.22–5.81)
RDW: 14.6 % (ref 11.5–15.5)
WBC: 8.7 10*3/uL (ref 4.0–10.5)
nRBC: 0 % (ref 0.0–0.2)

## 2022-05-25 LAB — MAGNESIUM: Magnesium: 2.2 mg/dL (ref 1.7–2.4)

## 2022-05-25 MED ORDER — AMIODARONE HCL 200 MG PO TABS: 200.0000 mg | ORAL_TABLET | Freq: Two times a day (BID) | ORAL | 0 refills | Status: AC

## 2022-05-25 MED ORDER — AMIODARONE HCL 200 MG PO TABS: 200.0000 mg | ORAL_TABLET | Freq: Every day | ORAL | 0 refills | Status: AC

## 2022-05-25 MED ORDER — HYDROCODONE-ACETAMINOPHEN 5-325 MG PO TABS: 1.0000 | ORAL_TABLET | ORAL | 0 refills | Status: DC | PRN

## 2022-05-25 NOTE — Progress Notes (Signed)
Pain medication administer:  PT wake up this morning give 7/10 back pain due to history of cancer. During breakfast reassess PT pain level give 5/10. Given ibuprofen 400 mg 1 tablet around 1010 will follow up around 1130.

## 2022-05-25 NOTE — Progress Notes (Signed)
Nutrition Follow-up  DOCUMENTATION CODES:   Non-severe (moderate) malnutrition in context of chronic illness  INTERVENTION:   -Continue MVI with minerals daily -Continue Ensure Enlive po TID, each supplement provides 350 kcal and 20 grams of protein -Continue 500 mg vitamin C BID -Continue 220 mg zinc sulfate daily x 14 days  NUTRITION DIAGNOSIS:   Moderate Malnutrition related to chronic illness (stage IV bladder cancer) as evidenced by mild fat depletion, moderate fat depletion, mild muscle depletion, moderate muscle depletion.  Ongoing  GOAL:   Patient will meet greater than or equal to 90% of their needs  Progressing   MONITOR:   PO intake, Supplement acceptance  REASON FOR ASSESSMENT:   Malnutrition Screening Tool    ASSESSMENT:   Pt with medical history significant of stage IV metastatic bladder cancer on Keytruda, Gilbert disease, hypertension, hyperlipidemia, atrial fibrillation, who presents with hypotension.  Reviewed I/O's: -600 ml x 24 hours and -1.4 L since admission  UOP: 1.2 L x 24 hours    Pt unavailable at time of visit. Attempted to speak with pt via call to hospital room phone, however, unable to reach.   Pt with improved oral intake. Noted meal completion 25-75%. He is drinking Ensure supplements.   Per TOC notes, pt awaiting authorization to discharge to SNF ([facility]).  Medications reviewed and include vitamin C, calcium carbonate, vitamin D3, cardizem, and zinc sulfate.   Labs reviewed: CBGS: 119 (inpatient orders for glycemic control are none).    Diet Order:   Diet Order             Diet regular Room service appropriate? Yes; Fluid consistency: Thin  Diet effective now                   EDUCATION NEEDS:   Education needs have been addressed  Skin:  Skin Assessment: Skin Integrity Issues: Skin Integrity Issues:: Other (Comment) Stage II: lt buttocks Other: non-pressure wound to vertebral column  Last BM:  05/24/22  (type 5)  Height:   Ht Readings from Last 1 Encounters:  05/19/22 '5\' 11"'$  (1.803 m)    Weight:   Wt Readings from Last 1 Encounters:  05/19/22 69.7 kg    Ideal Body Weight:  78.2 kg  BMI:  Body mass index is 21.43 kg/m.  Estimated Nutritional Needs:   Kcal:  1900-2100  Protein:  90-105 grams  Fluid:  > 1.9 L    Loistine Chance, RD, LDN, [place] Registered Dietitian II Certified Diabetes Care and Education Specialist Please refer to [facility] for RD and/or RD on-call/weekend/after hours pager

## 2022-05-25 NOTE — TOC Progression Note (Addendum)
Transition of Care [facility]) - Progression Note    Patient Details  Name: Christopher [name] MRN: [id number] Date of Birth: April 12, 1928  Transition [facility] Care [facility]) CM/SW [place], LCSW Phone Number: 05/25/2022, 10:28 AM  Clinical Narrative:   Insurance requesting peer-to-peer review by noon today. MD is aware. Updated daughter. She will want to pursue appeal if they deny authorization.  11:44 am: MD has called twice for peer-to-peer review. Insurance said they have until noon to call him back.  1:29 pm: Auth approved: [id number]. Valid 11/16-11/22. MD, SNF, and daughter are aware.  Expected Discharge Plan: [place] Barriers to Discharge: Continued Medical Work up  Expected Discharge Plan and Services Expected Discharge Plan: [place] Choice: [place] arrangements for the past 2 months: Single Family Home                                       Social Determinants of Health (SDOH) Interventions    Readmission Risk Interventions     No data to display

## 2022-05-25 NOTE — TOC Transition Note (Signed)
Transition of Care [facility]) - CM/SW Discharge Note   Patient Details  Name: Christopher [name] MRN: [id number] Date of Birth: 1927/10/28  Transition of Care [facility]) CM/SW Contact:  Candie Chroman, LCSW Phone Number: 05/25/2022, 2:30 PM   Clinical Narrative:   Patient has orders to discharge to [facility] today. RN has already called report. EMS transport has been arranged and he is 3rd on the list. No further concerns. CSW signing off.  Final next level of care: Skilled Nursing Facility Barriers to Discharge: Barriers Resolved   Patient Goals and CMS Choice Patient states their goals for this hospitalization and ongoing recovery are:: SNF for short term rehab CMS Medicare.gov Compare Post Acute Care list provided to:: Patient Represenative (must comment) Burna Sis) Choice offered to / list presented to : Patient, Adult Children  Discharge Placement   Existing PASRR number confirmed : 05/21/22          Patient chooses bed at: [facility] [place] Patient to be transferred to facility by: EMS Name of family member notified: Burna Sis Patient and family notified of of transfer: 05/25/22  Discharge Plan and Services     Post Acute Care Choice: [place]                               Social Determinants of Health (SDOH) Interventions     Readmission Risk Interventions     No data to display

## 2022-05-25 NOTE — Discharge Summary (Signed)
Physician Discharge Summary   Patient: Christopher [name] MRN: [id number] DOB: 10-20-1927  Admit date:     05/19/2022  Discharge date: 05/25/22  Discharge Physician: Sharen Hones   PCP: [name], MD   Recommendations at discharge:   Follow-up with PCP in 1 week. Follow-up with cardiology as scheduled. Taking amiodarone 200 mg twice a day for 3 days, then 200 mg daily.  Discharge Diagnoses: Principal Problem:   Atrial flutter with rapid ventricular response (HCC) Active Problems:   Hypotension   Leukocytosis   Pressure ulcer of coccygeal region, stage 2 (HCC)   Urothelial carcinoma of bladder (HCC)   Malnutrition of moderate degree Hyponatremia.  Resolved Problems:   * No resolved hospital problems. *  Hospital Course: Christopher [name] is a 88 y.o. male with medical history significant of stage IV metastatic bladder cancer on Keytruda, Rosanna Randy disease, hypertension, hyperlipidemia, atrial fibrillation, who presents to the ED with hypotension. He presented to his oncologist's office for his planned infusion of Keytruda.  Vital signs obtained at that time demonstrated blood pressure of 78/56 with heart rate of 82.  He received IV fluids, by the time he came to the emergency room, blood pressure was elevated with significant tachycardia with atrial flutter.  He was seen by cardiology, increase metoprolol and a diltiazem, continued Eliquis.  Assessment and Plan: Atrial flutter with rapid ventricular response. Transient hypotension. Sepsis is ruled out. Blood pressure has been better since arriving the hospital.  Patient had intermittent tachycardia while in the hospital.  Medication adjusted, per cardiology, continue metoprolol as well as diltiazem.  Continue amiodarone as prescribed above. Patient will be followed by cardiology and PCP in the office.    Pressure ulcer of coccygeal region, stage 2 ([place]) POA Follow-up with RN.   Urothelial carcinoma of bladder  [facility]) Follow-up with oncology as outpatient.   Malnutrition of moderate degree May give protein supplement in the nursing home.       Consultants: cardiology Procedures performed: None  Disposition: Skilled nursing facility Diet recommendation:  Discharge Diet Orders (From admission, onward)     Start     Ordered   05/25/22 0000  Diet - low sodium heart healthy        05/25/22 1332           Cardiac diet DISCHARGE MEDICATION: Allergies as of 05/25/2022       Reactions   Atenolol    Happened a long time ago and can't recall        Medication List     STOP taking these medications    dexamethasone 2 MG tablet Commonly known as: DECADRON   doxycycline 100 MG capsule Commonly known as: VIBRAMYCIN   mirtazapine 15 MG tablet Commonly known as: REMERON   multivitamin with minerals Tabs tablet   naloxone 4 MG/0.1ML Liqd nasal spray kit Commonly known as: NARCAN       TAKE these medications    amiodarone 200 MG tablet Commonly known as: PACERONE Take 1 tablet (200 mg total) by mouth 2 (two) times daily for 3 days.   amiodarone 200 MG tablet Commonly known as: PACERONE Take 1 tablet (200 mg total) by mouth daily. Start taking on: May 28, 2022   atorvastatin 10 MG tablet Commonly known as: LIPITOR Take 10 mg by mouth in the morning.   calcium carbonate 1500 (600 Ca) MG Tabs tablet Commonly known as: OSCAL Take 600 mg of elemental calcium by mouth in the morning.   diltiazem  240 MG 24 hr capsule Commonly known as: CARDIZEM CD Take 1 capsule (240 mg total) by mouth daily.   Eliquis 5 MG Tabs tablet Generic drug: apixaban Take 1 tablet by mouth 2 (two) times daily.   ferrous sulfate 325 (65 FE) MG tablet Take 325 mg by mouth in the morning and at bedtime.   finasteride 5 MG tablet Commonly known as: PROSCAR TAKE 1 TABLET DAILY   Fish Oil 1000 MG Caps Take 1,000 mg by mouth daily.   HYDROcodone-acetaminophen 5-325 MG  tablet Commonly known as: NORCO/VICODIN Take 1-2 tablets by mouth every 4 (four) hours as needed for moderate pain.   metoprolol tartrate 50 MG tablet Commonly known as: LOPRESSOR Take 1 tablet (50 mg total) by mouth 2 (two) times daily. What changed: Another medication with the same name was removed. Continue taking this medication, and follow the directions you see here.   PRESERVISION AREDS 2 PO Take 1 tablet by mouth in the morning and at bedtime.   tamsulosin 0.4 MG Caps capsule Commonly known as: FLOMAX Take 0.4 mg by mouth daily.   Vitamin D3 50 MCG (2000 UT) Tabs Take 2,000 Units by mouth in the morning.               Discharge Care Instructions  (From admission, onward)           Start     Ordered   05/25/22 0000  Discharge wound care:       Comments: Follow with RN   05/25/22 1332            Contact information for follow-up providers     Paraschos, Alexander, MD. Go in 1 week(s).   Specialty: Cardiology Contact information: [facility] [place] [address] [phone]              Contact information for after-discharge care     Destination     [facility] Preferred SNF .   Service: Skilled Nursing Contact information: [place] [phone]                    Discharge Exam: Christopher [name] Weights   05/19/22 1212  Weight: 69.7 kg   General exam: Appears calm and comfortable  Respiratory system: Clear to auscultation. Respiratory effort normal. Cardiovascular system: Regular. No JVD, murmurs, rubs, gallops or clicks. No pedal edema. Gastrointestinal system: Abdomen is nondistended, soft and nontender. No organomegaly or masses felt. Normal bowel sounds heard. Central nervous system: Alert and oriented. No focal neurological deficits. Extremities: Symmetric 5 x 5 power. Skin: No rashes, lesions or ulcers Psychiatry: Judgement  and insight appear normal. Mood & affect appropriate.    Condition at discharge: good  The results of significant diagnostics from this hospitalization (including imaging, microbiology, ancillary and laboratory) are listed below for reference.   Imaging Studies: DG Chest Port 1 View  Result Date: 05/19/2022 CLINICAL DATA:  Questionable sepsis, evaluate for abnormality. EXAM: PORTABLE CHEST 1 VIEW COMPARISON:  Chest radiograph dated January 16, 2021 FINDINGS: The heart size and mediastinal contours are within normal limits. Prominent atherosclerotic calcification of the aortic arch. Pacemaker leads in the right atrium and right ventricle. Lungs are clear without evidence of focal consolidation or pleural effusion. Spinal stimulator leads projecting over the lower thoracic spine. Osteopenia and thoracic spondylosis. IMPRESSION: No focal consolidation or large pleural effusion. Additional chronic findings as above. Electronically Signed   By: Wyatt Mage  Ahmed D.O.   On: 05/19/2022 13:00    Microbiology: Results for orders placed or performed during the hospital encounter of 05/19/22  Surgical pcr screen     Status: None   Collection Time: 05/19/22 12:40 AM   Specimen: Nasal Mucosa; Nasal Swab  Result Value Ref Range Status   MRSA, PCR NEGATIVE NEGATIVE Final   Staphylococcus aureus NEGATIVE NEGATIVE Final    Comment: (NOTE) The Xpert SA Assay (FDA approved for NASAL specimens in patients 46 years of age and older), is one component of a comprehensive surveillance program. It is not intended to diagnose infection nor to guide or monitor treatment. Performed at [facility], [address] [place], [place] [address]   Urine Culture     Status: None   Collection Time: 05/19/22 12:22 PM   Specimen: Urine, Random  Result Value Ref Range Status   Specimen Description   Final    URINE, RANDOM Performed at [facility], [address] [place], [place] [address]    Special  Requests   Final    NONE Performed at [facility], [address], [place], [place] [address]    Culture   Final    NO GROWTH Performed at [facility], [place] [address], [place], [place] [address]    Report Status 05/21/2022 FINAL  Final  Blood culture (routine single)     Status: None   Collection Time: 05/19/22 12:23 PM   Specimen: BLOOD  Result Value Ref Range Status   Specimen Description BLOOD RIGHT ANTECUBITAL  Final   Special Requests   Final    BOTTLES DRAWN AEROBIC AND ANAEROBIC Blood Culture adequate volume   Culture   Final    NO GROWTH 5 DAYS Performed at [facility], [address] [place], [place] [address]    Report Status 05/24/2022 FINAL  Final  Culture, blood (single)     Status: None   Collection Time: 05/19/22  1:01 PM   Specimen: BLOOD  Result Value Ref Range Status   Specimen Description BLOOD LEFT ANTECUBITAL  Final   Special Requests   Final    BOTTLES DRAWN AEROBIC AND ANAEROBIC Blood Culture adequate volume   Culture   Final    NO GROWTH 5 DAYS Performed at [facility], [address] [place], [place] [address]    Report Status 05/24/2022 FINAL  Final  SARS Coronavirus 2 by RT PCR (hospital order, performed in [facility] hospital lab) *cepheid single result test* Anterior Nasal Swab     Status: None   Collection Time: 05/19/22  5:25 PM   Specimen: Anterior Nasal Swab  Result Value Ref Range Status   SARS Coronavirus 2 by RT PCR NEGATIVE NEGATIVE Final    Comment: (NOTE) SARS-CoV-2 target nucleic acids are NOT DETECTED.  The SARS-CoV-2 RNA is generally detectable in upper and lower respiratory specimens during the acute phase of infection. The lowest concentration of SARS-CoV-2 viral copies this assay can detect is 250 copies / mL. A negative result does not preclude SARS-CoV-2 infection and should not be used as the sole basis for treatment or other patient management decisions.  A negative result may  occur with improper specimen collection / handling, submission of specimen other than nasopharyngeal swab, presence of viral mutation(s) within the areas targeted by this assay, and inadequate number of viral copies (<250 copies / mL). A negative result must be combined with clinical observations, patient history, and epidemiological information.  Fact  Sheet for Patients:   https://www.patel.info/  Fact Sheet for Healthcare Providers: https://hall.com/  This test is not yet approved or  cleared by the [place] FDA and has been authorized for detection and/or diagnosis of SARS-CoV-2 by FDA under an Emergency Use Authorization (EUA).  This EUA will remain in effect (meaning this test can be used) for the duration of the COVID-19 declaration under Section 564(b)(1) of the Act, 21 U.S.C. section 360bbb-3(b)(1), unless the authorization is terminated or revoked sooner.  Performed at [facility], [address] [place], [place] [address]     Labs: CBC: Recent Labs  Lab 05/19/22 1058 05/19/22 1219 05/20/22 0516 05/21/22 0412 05/22/22 0525 05/23/22 0534 05/24/22 0535 05/25/22 0447  WBC 13.1* 13.8*   < > 8.7 9.0 9.3 8.1 8.7  NEUTROABS 11.6* 12.3*  --   --   --   --   --   --   HGB 12.1* 12.3*   < > 10.7* 11.3* 10.9* 10.8* 11.0*  HCT 38.6* 38.5*   < > 32.9* 33.9* 33.7* 34.0* 34.2*  MCV 95.8 94.6   < > 91.6 91.6 91.3 92.4 92.4  PLT 432* 432*   < > 333 327 315 299 285   < > = values in this interval not displayed.   Basic Metabolic Panel: Recent Labs  Lab 05/21/22 0412 05/22/22 0525 05/23/22 0534 05/24/22 0535 05/25/22 0447  NA 132* 134* 136 139 138  K 3.9 4.3 3.7 4.0 3.9  CL 101 100 100 102 102  CO2 _0 GLUCOSE 89 95 127* 106* 113*  BUN _1 CREATININE 0.56* 0.61 0.63 0.61 0.58*  CALCIUM 8.1* 8.8* 8.8* 9.0 8.9  MG 2.0 2.2 2.2 2.2 2.2   Liver Function Tests: Recent Labs  Lab  05/19/22 1058 05/19/22 1219 05/20/22 0516  AST 34 36 31  ALT 56* 57* 41  ALKPHOS 126 127* 107  BILITOT 0.6 0.8 0.7  PROT 6.7 6.9 5.1*  ALBUMIN 2.9* 2.9* 2.2*   CBG: No results for input(s): "GLUCAP" in the last 168 hours.  Discharge time spent: greater than 30 minutes.  Signed: Sharen Hones, MD [facility] 05/25/2022

## 2022-05-31 ENCOUNTER — Encounter: Payer: Self-pay | Admitting: Internal Medicine

## 2022-06-05 ENCOUNTER — Telehealth: Payer: Self-pay | Admitting: *Deleted

## 2022-06-05 ENCOUNTER — Encounter: Payer: Self-pay | Admitting: Hospice and Palliative Medicine

## 2022-06-05 DIAGNOSIS — C678 Malignant neoplasm of overlapping sites of bladder: Secondary | ICD-10-CM

## 2022-06-05 NOTE — Telephone Encounter (Signed)
Call from [facility] reporting that patient pain has spread to his neck and head and she is requesting a virtual visit for the patient. Please advise

## 2022-06-05 NOTE — Telephone Encounter (Signed)
Medication management would be challenging virtually while patient is at [facility]. I will place a palliative care referral at SNF. We can see patient in clinic if needed.

## 2022-06-06 ENCOUNTER — Telehealth: Payer: Self-pay | Admitting: *Deleted

## 2022-06-06 NOTE — Telephone Encounter (Signed)
[facility] contacted [facility] to facilitate conversations for f/u and community palliative care. Per [facility] at [facility], pt is being rounded on by an NP and MD at the facility. For community palliative care referral, the referral must be faxed from the cancer center to [facility], then [facility] will then contact [facility] to arrange for consultation. Family concerned that patient is not getting adequate pain mgmt. As explained to family, a virtual visit w/Josh, NP is not preferred for medication adjustments/recommendations. The apt needs to be a face to face. Apts made for 12/1 with Dr.B and Merrily Pew, NP.  A palliative care referral faxed to [facility] at [phone]. Communication has been made to family via mychart for plan of care.

## 2022-06-07 ENCOUNTER — Other Ambulatory Visit: Payer: Self-pay | Admitting: *Deleted

## 2022-06-07 DIAGNOSIS — C678 Malignant neoplasm of overlapping sites of bladder: Secondary | ICD-10-CM

## 2022-06-09 ENCOUNTER — Ambulatory Visit
Admission: RE | Admit: 2022-06-09 | Discharge: 2022-06-09 | Disposition: A | Discharge status: Home / Self Care | Payer: Medicare HMO | Source: Ambulatory Visit | Attending: Hospice and Palliative Medicine | Admitting: Hospice and Palliative Medicine

## 2022-06-09 ENCOUNTER — Inpatient Hospital Stay (HOSPITAL_BASED_OUTPATIENT_CLINIC_OR_DEPARTMENT_OTHER): Payer: Medicare HMO | Admitting: Hospice and Palliative Medicine

## 2022-06-09 ENCOUNTER — Inpatient Hospital Stay: Payer: Medicare HMO | Attending: Oncology | Admitting: Internal Medicine

## 2022-06-09 ENCOUNTER — Inpatient Hospital Stay: Payer: Medicare HMO

## 2022-06-09 ENCOUNTER — Other Ambulatory Visit: Payer: Self-pay | Admitting: Hospice and Palliative Medicine

## 2022-06-09 ENCOUNTER — Encounter: Payer: Self-pay | Admitting: Hospice and Palliative Medicine

## 2022-06-09 VITALS — BP 95/61 | HR 74 | Temp 96.4°F

## 2022-06-09 DIAGNOSIS — Z85828 Personal history of other malignant neoplasm of skin: Secondary | ICD-10-CM | POA: Insufficient documentation

## 2022-06-09 DIAGNOSIS — M545 Low back pain, unspecified: Secondary | ICD-10-CM | POA: Diagnosis not present

## 2022-06-09 DIAGNOSIS — R918 Other nonspecific abnormal finding of lung field: Secondary | ICD-10-CM | POA: Diagnosis not present

## 2022-06-09 DIAGNOSIS — G893 Neoplasm related pain (acute) (chronic): Secondary | ICD-10-CM | POA: Insufficient documentation

## 2022-06-09 DIAGNOSIS — Z801 Family history of malignant neoplasm of trachea, bronchus and lung: Secondary | ICD-10-CM | POA: Diagnosis not present

## 2022-06-09 DIAGNOSIS — Z923 Personal history of irradiation: Secondary | ICD-10-CM | POA: Diagnosis not present

## 2022-06-09 DIAGNOSIS — C678 Malignant neoplasm of overlapping sites of bladder: Secondary | ICD-10-CM

## 2022-06-09 DIAGNOSIS — Z79899 Other long term (current) drug therapy: Secondary | ICD-10-CM | POA: Insufficient documentation

## 2022-06-09 DIAGNOSIS — I10 Essential (primary) hypertension: Secondary | ICD-10-CM | POA: Insufficient documentation

## 2022-06-09 DIAGNOSIS — C770 Secondary and unspecified malignant neoplasm of lymph nodes of head, face and neck: Secondary | ICD-10-CM | POA: Insufficient documentation

## 2022-06-09 DIAGNOSIS — D500 Iron deficiency anemia secondary to blood loss (chronic): Secondary | ICD-10-CM | POA: Insufficient documentation

## 2022-06-09 DIAGNOSIS — M791 Myalgia, unspecified site: Secondary | ICD-10-CM | POA: Insufficient documentation

## 2022-06-09 DIAGNOSIS — Z515 Encounter for palliative care: Secondary | ICD-10-CM

## 2022-06-09 DIAGNOSIS — C7951 Secondary malignant neoplasm of bone: Secondary | ICD-10-CM | POA: Diagnosis not present

## 2022-06-09 DIAGNOSIS — M79601 Pain in right arm: Secondary | ICD-10-CM | POA: Insufficient documentation

## 2022-06-09 LAB — CBC WITH DIFFERENTIAL/PLATELET
Abs Immature Granulocytes: 0.09 10*3/uL — ABNORMAL HIGH (ref 0.00–0.07)
Basophils Absolute: 0 10*3/uL (ref 0.0–0.1)
Basophils Relative: 0 %
Eosinophils Absolute: 0.1 10*3/uL (ref 0.0–0.5)
Eosinophils Relative: 1 %
HCT: 39.5 % (ref 39.0–52.0)
Hemoglobin: 12.2 g/dL — ABNORMAL LOW (ref 13.0–17.0)
Immature Granulocytes: 1 %
Lymphocytes Relative: 7 %
Lymphs Abs: 0.5 10*3/uL — ABNORMAL LOW (ref 0.7–4.0)
MCH: 29 pg (ref 26.0–34.0)
MCHC: 30.9 g/dL (ref 30.0–36.0)
MCV: 93.8 fL (ref 80.0–100.0)
Monocytes Absolute: 0.8 10*3/uL (ref 0.1–1.0)
Monocytes Relative: 11 %
Neutro Abs: 5.6 10*3/uL (ref 1.7–7.7)
Neutrophils Relative %: 80 %
Platelets: 374 10*3/uL (ref 150–400)
RBC: 4.21 MIL/uL — ABNORMAL LOW (ref 4.22–5.81)
RDW: 15.5 % (ref 11.5–15.5)
WBC: 6.9 10*3/uL (ref 4.0–10.5)
nRBC: 0 % (ref 0.0–0.2)

## 2022-06-09 LAB — COMPREHENSIVE METABOLIC PANEL
ALT: 21 U/L (ref 0–44)
AST: 29 U/L (ref 15–41)
Albumin: 2.5 g/dL — ABNORMAL LOW (ref 3.5–5.0)
Alkaline Phosphatase: 203 U/L — ABNORMAL HIGH (ref 38–126)
Anion gap: 10 (ref 5–15)
BUN: 17 mg/dL (ref 8–23)
CO2: 31 mmol/L (ref 22–32)
Calcium: 9.3 mg/dL (ref 8.9–10.3)
Chloride: 100 mmol/L (ref 98–111)
Creatinine, Ser: 0.73 mg/dL (ref 0.61–1.24)
GFR, Estimated: >60 mL/min (ref >60)
Glucose, Bld: 111 mg/dL — ABNORMAL HIGH (ref 70–99)
Potassium: 4.3 mmol/L (ref 3.5–5.1)
Sodium: 141 mmol/L (ref 135–145)
Total Bilirubin: 0.7 mg/dL (ref 0.3–1.2)
Total Protein: 6.6 g/dL (ref 6.5–8.1)

## 2022-06-09 LAB — TSH: TSH: 2.214 u[IU]/mL (ref 0.350–4.500)

## 2022-06-09 MED ORDER — HYDROCODONE-ACETAMINOPHEN 5-325 MG PO TABS: 2.0000 | ORAL_TABLET | Freq: Four times a day (QID) | ORAL | 0 refills | Status: DC

## 2022-06-09 MED ORDER — HYDROCODONE-ACETAMINOPHEN 5-325 MG PO TABS: 2.0000 | ORAL_TABLET | Freq: Four times a day (QID) | ORAL | 0 refills | Status: AC

## 2022-06-09 MED ORDER — PREDNISONE 20 MG PO TABS: ORAL_TABLET | ORAL | 0 refills | Status: AC

## 2022-06-09 MED ORDER — PREDNISONE 20 MG PO TABS: ORAL_TABLET | ORAL | 0 refills | Status: DC

## 2022-06-09 NOTE — Assessment & Plan Note (Addendum)
JUNE 2022- CT [facility]- Multiple recurrent high-grade-suspicious for muscle invasive urothelial malignancy; possible extravascular extension of the tumor [biopsy-[facility] negative for muscle invasion].  S/p  radiation at [facility].  S/p cystoscopy- NEG. AUG 11th, 2023- PET scan- Hypermetabolic metastatic osseous lesions, left supraclavicular lymph nodes, right upper lobe nodule and abdominal retroperitoneal adenopathy-highly concerning for malignancy.  Suprclaviulcar LN- biopsy- positive for malignancy; QNS for IHC. Currently on keytruda on palliative basis.  Currently status post 3 cycles.  # Given significant decline in the performance status [weight loss; limited mobility; worsening pain]-discussed the differential includes progressive disease versus side effects from therapy.  Discussed that we will discontinue further therapy-not a candidate for any further therapy for any of the above reasons.  Given family request/will repeat PET scan for further eval.  Recommend prednisone 20 mg a day.  # worsening MSK pain- cervical/ thoracics Lumbar back pain-likely secondary to metastatic lesions-currently s/p radiation oncology for palliative radiation [sep 1st 2023].  Continue hydrocodone q 8 hours prn.  Right arm pain-sec to metastatic disease s/p RT [10/16; 10/17].- added MS contin 30 mg BID.  Check x-rays of the cervical spine thoracic spine-rule out any compression fractures.  # Iron deficiency anemia secondary to chronic blood loss/hematuria [see below]- ;SEP 2023- Iron sat-18; ferritin-41; s/p Venofer.  Hemoglobin today is 12.3.  Hold iron infusion.  # IV access: PIV for now.   # PROGNOSIS: I discussed the overall poor prognosis with the patient and his 2 daughters.  I discussed given his situation/declining performance status hospice would be reasonable.  They state that they would want to get the PET scan before making any decisions.  Discussed with Dr. Regenia Skeeter.  # DISPOSITION:  # follow up in 2 weeks- MD; labs-  cbc/cmp; PER priro-Dr.B  # 40 minutes face-to-face with the patient discussing the above plan of care; more than 50% of time spent on prognosis/ natural history; counseling and coordination.

## 2022-06-09 NOTE — Progress Notes (Signed)
[place] NOTE  Patient Care Team: [name], MD as PCP - General (Infectious Diseases) Cammie Sickle, MD as Consulting Physician (Oncology) Jason Coop, NP (Inactive) as Nurse Practitioner (Hospice and Palliative Medicine) Borders, Kirt Boys, NP as Nurse Practitioner (Hospice and Palliative Medicine)  CHIEF COMPLAINTS/PURPOSE OF CONSULTATION: Bladder cancer  Oncology History Overview Note  # NOV 2020- [incidental/hematuria work-up]-subtle peritoneal thickening/plaque-like lesions; March 2021-CT scan progressive omental/peritoneal thickening  #Microscopic hematuria- sec to BPH [cystoscopy/CT urogram negative; Dr.Stoiff]; March 2021 CT scan-new lesion in the bladder diverticulum  # MAY-June 2021- severe IDA sec to hemauria [hb 8.5; Ferritin- ]  #July 2022-hemorrhagic shock/bladder cancer-s/p radiation [facility]; no chemotherapy.[Dr.Neilsen]  # AUG 11th, 2023- PET scan- Hypermetabolic metastatic osseous lesions, left supraclavicular lymph nodes, right upper lobe nodule and abdominal retroperitoneal adenopathy- BIOPSY- POSITIVE FOR MALIGNANCY; QNS for IHC. BACK RT s/p March 10, 2022 '[]'$   # SEP 7th, 2023- Keytruda single agent.   # on eliquis ? A.fib/pacemaker;   Urothelial carcinoma of bladder ([place])  10/29/2019 Initial Diagnosis   Urothelial carcinoma of bladder (HCC)   Cancer of overlapping sites of bladder ([place])  02/17/2022 Initial Diagnosis   Cancer of overlapping sites of bladder ([place])   02/17/2022 Cancer Staging   Staging form: Urinary Bladder, AJCC 8th Edition - Clinical: Stage Unknown (cTX, cN2, cM1) - Signed by Cammie Sickle, MD on 02/17/2022   03/16/2022 -  Chemotherapy   Patient is on Treatment Plan : BLADDER Pembrolizumab (200) q21d        HISTORY OF PRESENTING ILLNESS: pt ambulating  in wheelchair.; is accompanied by his daughter  Christopher [name] 88 y.o.  male history of chronic back pain; and multiple  comorbidities with LIKELY recurrent /STAGE IV bladder malignancy [supraclavicular lymph node positive for malignancy; QNS for IHC] currently on single agent Keytruda is here for follow-up.   Patient s/p radiation to his right arm/back pain.   In the interim patient was admitted to hospital for possible sepsis/infection; A-flutter.  Patient is currently in rehab.  Unable to participate in rehab because of pain.  Patient overall doing poorly.  States his pain is worse mostly in the neck/upper back.    Had episode of constipation-currently improved.  Positive for weight loss.  Review of Systems  Constitutional:  Positive for malaise/fatigue and weight loss. Negative for chills, diaphoresis and fever.  HENT:  Negative for nosebleeds and sore throat.   Eyes:  Negative for double vision.  Respiratory:  Negative for cough, hemoptysis, sputum production and wheezing.   Cardiovascular:  Negative for chest pain, palpitations, orthopnea and leg swelling.  Gastrointestinal:  Positive for constipation. Negative for abdominal pain, blood in stool, diarrhea, heartburn, melena, nausea and vomiting.  Genitourinary:  Positive for hematuria. Negative for dysuria, frequency and urgency.  Musculoskeletal:  Positive for back pain and joint pain.  Skin: Negative.  Negative for itching and rash.  Neurological:  Negative for dizziness, tingling, focal weakness, weakness and headaches.  Endo/Heme/Allergies:  Does not bruise/bleed easily.  Psychiatric/Behavioral:  Negative for depression. The patient is not nervous/anxious and does not have insomnia.      MEDICAL HISTORY:  Past Medical History:  Diagnosis Date   A-fib ([place])    Anemia    Arthritis    BACK   Bladder cancer ([place])    BPH (benign prostatic hyperplasia)    Cancer (HCC)    skin cancer on face carcinoma   ED (erectile dysfunction)    Rosanna Randy disease  HLD (hyperlipidemia)    Hypertension    Macular degeneration    Mollusca contagiosa     Osteopenia    Presence of permanent cardiac pacemaker    Sensorineural hearing loss    Squamous cell carcinoma of skin 08/25/2019   R cheek    Squamous cell carcinoma of skin 02/12/2020   L neck    Squamous cell carcinoma of skin 11/30/2020   Right elbow, [facility]   Squamous cell carcinoma of skin 03/16/2021   right base of thumb [facility]   Squamous cell carcinoma of skin 03/16/2021   right preauricular - [facility] 11/17/21   Squamous cell carcinoma of skin 10/05/2021   Right dorsum hand - [facility]   Squamous cell carcinoma of skin 11/22/2021   right inf preauricular anterior, [facility]   Squamous cell carcinoma of skin 11/22/2021   right inf preauricular posterior, [facility]    SURGICAL HISTORY: Past Surgical History:  Procedure Laterality Date   CATARACT EXTRACTION Bilateral    CYSTOSCOPY WITH BIOPSY N/A 01/20/2020   Procedure: CYSTOSCOPY WITH BIOPSY;  Surgeon: Abbie Sons, MD;  Location: [facility];  Service: Urology;  Laterality: N/A;   CYSTOSCOPY WITH FULGERATION N/A 01/20/2020   Procedure: CYSTOSCOPY WITH FULGERATION;  Surgeon: Abbie Sons, MD;  Location: [facility];  Service: Urology;  Laterality: N/A;   CYSTOSCOPY WITH FULGERATION N/A 04/19/2020   Procedure: [place] with clot evacuation;  Surgeon: Abbie Sons, MD;  Location: [facility];  Service: Urology;  Laterality: N/A;   CYSTOSCOPY WITH FULGERATION N/A 01/05/2021   Procedure: CYSTOSCOPY WITH FULGERATION;  Surgeon: Abbie Sons, MD;  Location: [facility];  Service: Urology;  Laterality: N/A;   HEMORRHOIDECTOMY WITH HEMORRHOID BANDING     KNEE ARTHROSCOPY Left    PACEMAKER INSERTION N/A 03/03/2015   Procedure: INSERTION DUAL LEAD PACEMAKER;  Surgeon: Isaias Cowman, MD;  Location: [facility];  Service: Cardiovascular;  Laterality: N/A;   PULSE GENERATOR IMPLANT N/A 10/18/2020   Procedure: UNILATERAL PULSE GENERATOR IMPLANT;  Surgeon: [name], MD;  Location: [facility];  Service: Neurosurgery;  Laterality: N/A;    SPINAL CORD STIMULATOR TRIAL N/A 10/11/2020   Procedure: PERCUTANEOUS SPINAL CORD STIMULATOR TRIAL;  Surgeon: [name], MD;  Location: [facility];  Service: Neurosurgery;  Laterality: N/A;   THORACIC LAMINECTOMY FOR SPINAL CORD STIMULATOR N/A 10/18/2020   Procedure: PERCUTANEOUS LEAD PLACEMENT;  Surgeon: [name], MD;  Location: [facility];  Service: Neurosurgery;  Laterality: N/A;   TONSILLECTOMY     TRANSURETHRAL RESECTION OF BLADDER TUMOR N/A 10/07/2019   Procedure: TRANSURETHRAL RESECTION OF BLADDER TUMOR (TURBT);  Surgeon: Abbie Sons, MD;  Location: [facility];  Service: Urology;  Laterality: N/A;   TRANSURETHRAL RESECTION OF BLADDER TUMOR N/A 04/13/2020   Procedure: TRANSURETHRAL RESECTION OF BLADDER TUMOR (TURBT);  Surgeon: Abbie Sons, MD;  Location: [facility];  Service: Urology;  Laterality: N/A;    SOCIAL HISTORY: Social History   Socioeconomic History   Marital status: Widowed    Spouse name: Not on file   Number of children: Not on file   Years of education: Not on file   Highest education level: Not on file  Occupational History   Not on file  Tobacco Use   Smoking status: Never   Smokeless tobacco: Never  Vaping Use   Vaping Use: Never used  Substance and Sexual Activity   Alcohol use: Yes    Alcohol/week: 7.0 standard drinks of alcohol    Types: 7 Glasses of wine per  week    Comment: DAILY    Drug use: No   Sexual activity: Yes    Birth control/protection: None  Other Topics Concern   Not on file  Social History Narrative   Retd. Civil engineer, contracting; [place]; self. Never smoked; alcohol- glass wine/drink every night.     Social Determinants of Health   Financial Resource Strain: Not on file  Food Insecurity: No Food Insecurity (05/19/2022)   Hunger Vital Sign    Worried About Running Out of Food in the Last Year: Never true    Ran Out of Food in the Last Year: Never true  Transportation Needs: No Transportation Needs (05/19/2022)   PRAPARE -  Hydrologist (Medical): No    Lack of Transportation (Non-Medical): No  Physical Activity: Not on file  Stress: Not on file  Social Connections: Not on file  Intimate Partner Violence: Not At Risk (05/19/2022)   Humiliation, Afraid, Rape, and Kick questionnaire    Fear of Current or Ex-Partner: No    Emotionally Abused: No    Physically Abused: No    Sexually Abused: No    FAMILY HISTORY: Family History  Problem Relation Age of Onset   Diabetes Mother    Lung cancer Father    Cancer Paternal Uncle    Prostate cancer Neg Hx    Bladder Cancer Neg Hx    Kidney cancer Neg Hx     ALLERGIES:  is allergic to atenolol.  MEDICATIONS:  Current Outpatient Medications  Medication Sig Dispense Refill   amiodarone (PACERONE) 200 MG tablet Take 1 tablet (200 mg total) by mouth 2 (two) times daily for 3 days. 6 tablet 0   amiodarone (PACERONE) 200 MG tablet Take 1 tablet (200 mg total) by mouth daily. 30 tablet 0   atorvastatin (LIPITOR) 10 MG tablet Take 10 mg by mouth in the morning.     calcium carbonate (OSCAL) 1500 (600 Ca) MG TABS tablet Take 600 mg of elemental calcium by mouth in the morning.     Cholecalciferol (VITAMIN D3) 50 MCG (2000 UT) TABS Take 2,000 Units by mouth in the morning.     diltiazem (CARDIZEM CD) 240 MG 24 hr capsule Take 1 capsule (240 mg total) by mouth daily. 30 capsule 0   ELIQUIS 5 MG TABS tablet Take 1 tablet by mouth 2 (two) times daily.     ferrous sulfate 325 (65 FE) MG tablet Take 325 mg by mouth in the morning and at bedtime.     finasteride (PROSCAR) 5 MG tablet TAKE 1 TABLET DAILY (Patient taking differently: Take 5 mg by mouth daily.) 90 tablet 3   HYDROcodone-acetaminophen (NORCO/VICODIN) 5-325 MG tablet Take 2 tablets by mouth every 6 (six) hours. Take 2 tablets every 6 hours while awake. 60 tablet 0   metoprolol tartrate (LOPRESSOR) 50 MG tablet Take 1 tablet (50 mg total) by mouth 2 (two) times daily. (Patient not  taking: Reported on 05/19/2022) 60 tablet 0   Multiple Vitamins-Minerals (PRESERVISION AREDS 2 PO) Take 1 tablet by mouth in the morning and at bedtime.     Omega-3 Fatty Acids (FISH OIL) 1000 MG CAPS Take 1,000 mg by mouth daily.     predniSONE (DELTASONE) 20 MG tablet Take 1 tablet daily for 2 weeks, then take 1/2 tablet daily x 2 weeks 30 tablet 0   tamsulosin (FLOMAX) 0.4 MG CAPS capsule Take 0.4 mg by mouth daily.     No current facility-administered medications for  this visit.      Marland Kitchen  PHYSICAL EXAMINATION: ECOG PERFORMANCE STATUS: 0 - Asymptomatic  There were no vitals filed for this visit.    There were no vitals filed for this visit.    Frail-appearing Caucasian male patient.  In a wheelchair.  Slouched.  Unable to lift his head because of neck pain.  Physical Exam HENT:     Head: Normocephalic and atraumatic.     Mouth/Throat:     Pharynx: No oropharyngeal exudate.  Eyes:     Pupils: Pupils are equal, round, and reactive to light.  Cardiovascular:     Rate and Rhythm: Normal rate and regular rhythm.  Pulmonary:     Effort: No respiratory distress.     Breath sounds: No wheezing.  Abdominal:     General: Bowel sounds are normal. There is no distension.     Palpations: Abdomen is soft. There is no mass.     Tenderness: There is no abdominal tenderness. There is no guarding or rebound.  Musculoskeletal:        General: No tenderness.     Cervical back: Normal range of motion and neck supple.  Skin:    General: Skin is warm.  Neurological:     Mental Status: He is alert and oriented to person, place, and time.  Psychiatric:        Mood and Affect: Affect normal.     LABORATORY DATA:  I have reviewed the data as listed Lab Results  Component Value Date   WBC 6.9 06/09/2022   HGB 12.2 (L) 06/09/2022   HCT 39.5 06/09/2022   MCV 93.8 06/09/2022   PLT 374 06/09/2022   Recent Labs    05/19/22 1219 05/20/22 0516 05/21/22 0412 05/24/22 0535  05/25/22 0447 06/09/22 1415  NA 136 135   < > 139 138 141  K 4.2 4.1   < > 4.0 3.9 4.3  CL 99 102   < > 102 102 100  CO2 28 29   < > '30 29 31  '$ GLUCOSE 111* 91   < > 106* 113* 111*  BUN 19 16   < > '23 23 17  '$ CREATININE 0.78 0.59*   < > 0.61 0.58* 0.73  CALCIUM 9.5 8.3*   < > 9.0 8.9 9.3  GFRNONAA >60 >60   < > >60 >60 >60  PROT 6.9 5.1*  --   --   --  6.6  ALBUMIN 2.9* 2.2*  --   --   --  2.5*  AST 36 31  --   --   --  29  ALT 57* 41  --   --   --  21  ALKPHOS 127* 107  --   --   --  203*  BILITOT 0.8 0.7  --   --   --  0.7   < > = values in this interval not displayed.    RADIOGRAPHIC STUDIES: I have personally reviewed the radiological images as listed and agreed with the findings in the report. DG Chest Port 1 View  Result Date: 05/19/2022 CLINICAL DATA:  Questionable sepsis, evaluate for abnormality. EXAM: PORTABLE CHEST 1 VIEW COMPARISON:  Chest radiograph dated January 16, 2021 FINDINGS: The heart size and mediastinal contours are within normal limits. Prominent atherosclerotic calcification of the aortic arch. Pacemaker leads in the right atrium and right ventricle. Lungs are clear without evidence of focal consolidation or pleural effusion. Spinal stimulator leads projecting over the lower thoracic spine.  Osteopenia and thoracic spondylosis. IMPRESSION: No focal consolidation or large pleural effusion. Additional chronic findings as above. Electronically Signed   By: Keane Police D.O.   On: 05/19/2022 13:00    ASSESSMENT & PLAN:   Cancer of overlapping sites of bladder [facility])  JUNE 2022- CT [facility]- Multiple recurrent high-grade-suspicious for muscle invasive urothelial malignancy; possible extravascular extension of the tumor [biopsy-[facility] negative for muscle invasion].  S/p  radiation at [facility].  S/p cystoscopy- NEG. AUG 11th, 2023- PET scan- Hypermetabolic metastatic osseous lesions, left supraclavicular lymph nodes, right upper lobe nodule and abdominal retroperitoneal adenopathy-highly  concerning for malignancy.  Suprclaviulcar LN- biopsy- positive for malignancy; QNS for IHC. Currently on keytruda on palliative basis.   # s/p  Keytruda #3.  Labs today reviewed;  acceptable for treatment today. Continue to HOLD off any therapy for now. Will repeat PET scan as pt/family request.   # worsening MSK pain- cervical/ thoracics Lumbar back pain-likely secondary to metastatic lesions-currently s/p radiation oncology for palliative radiation [sep 1st 2023].  Continue hydrocodone q 8 hours prn.  Right arm pain-sec to metastatic disease s/p RT [10/16; 10/17].- added MS contin 30 mg BID- new script sent.   # Iron deficiency anemia secondary to chronic blood loss/hematuria [see below]- S/p  IV Venofer; Today Hb-10.9 ;SEP 2023- Iron sat-18; ferritin-41;  Discussed the potential acute infusion reactions with IV iron; which are quite rare.  Patient understands the risk; will proceed with infusions.  # IV access: PIV for now.   # DISPOSITION:  # follow up in 2 weeks- MD; labs- cbc/cmp; PER priro-Dr.B           All questions were answered. The patient knows to call the clinic with any problems, questions or concerns.    Cammie Sickle, MD 06/09/2022 4:12 PM

## 2022-06-09 NOTE — Progress Notes (Signed)
[place] at [facility] Telephone:([phone] [phone]   Name: Christopher [name] Date: 06/09/2022 MRN: [id number]  DOB: 08/14/1927  Patient Care Team: [name], MD as PCP - General (Infectious Diseases) Cammie Sickle, MD as Consulting Physician (Oncology) Jason Coop, NP (Inactive) as Nurse Practitioner (Hospice and Palliative Medicine) Miriah Maruyama, Kirt Boys, NP as Nurse Practitioner (Hospice and Palliative Medicine)    REASON FOR CONSULTATION: Christopher [name] is a 88 y.o. male with multiple medical problems including history of peritoneal/omental thickening of unclear etiology also with recurrent high-grade bladder cancer status post XRT at [facility]. PET scan February 17, 2022 revealed hypermetabolic metastatic osseous lesions, left supraclavicular lymph node involvement, right upper lobe, and hypermetabolic abdominal retroperitoneal adenopathy. Patient was not felt to be a poor candidate for systemic chemotherapy given his age. He was started on immunotherapy instead. He had persistent lumbar back pain likely secondary to metastatic lesions.   Palliative care was consulted to address goals and manage ongoing symptoms.  SOCIAL HISTORY:     reports that he has never smoked. He has never used smokeless tobacco. He reports current alcohol use of about 7.0 standard drinks of alcohol per week. He reports that he does not use drugs.  ADVANCE DIRECTIVES:  Not on file  CODE STATUS: DNR/DNI (MOST form signed on 03/07/2022)  PAST MEDICAL HISTORY: Past Medical History:  Diagnosis Date   A-fib ([place])    Anemia    Arthritis    BACK   Bladder cancer (HCC)    BPH (benign prostatic hyperplasia)    Cancer (HCC)    skin cancer on face carcinoma   ED (erectile dysfunction)    Rosanna Randy disease    HLD (hyperlipidemia)    Hypertension    Macular degeneration    Mollusca contagiosa    Osteopenia    Presence of  permanent cardiac pacemaker    Sensorineural hearing loss    Squamous cell carcinoma of skin 08/25/2019   R cheek    Squamous cell carcinoma of skin 02/12/2020   L neck    Squamous cell carcinoma of skin 11/30/2020   Right elbow, [facility]   Squamous cell carcinoma of skin 03/16/2021   right base of thumb [facility]   Squamous cell carcinoma of skin 03/16/2021   right preauricular - [facility] 11/17/21   Squamous cell carcinoma of skin 10/05/2021   Right dorsum hand - [facility]   Squamous cell carcinoma of skin 11/22/2021   right inf preauricular anterior, [facility]   Squamous cell carcinoma of skin 11/22/2021   right inf preauricular posterior, [facility]    PAST SURGICAL HISTORY:  Past Surgical History:  Procedure Laterality Date   CATARACT EXTRACTION Bilateral    CYSTOSCOPY WITH BIOPSY N/A 01/20/2020   Procedure: CYSTOSCOPY WITH BIOPSY;  Surgeon: Abbie Sons, MD;  Location: [facility];  Service: Urology;  Laterality: N/A;   CYSTOSCOPY WITH FULGERATION N/A 01/20/2020   Procedure: CYSTOSCOPY WITH FULGERATION;  Surgeon: Abbie Sons, MD;  Location: [facility];  Service: Urology;  Laterality: N/A;   CYSTOSCOPY WITH FULGERATION N/A 04/19/2020   Procedure: Reynolds with clot evacuation;  Surgeon: Abbie Sons, MD;  Location: [facility];  Service: Urology;  Laterality: N/A;   CYSTOSCOPY WITH FULGERATION N/A 01/05/2021   Procedure: CYSTOSCOPY WITH FULGERATION;  Surgeon: Abbie Sons, MD;  Location: [facility];  Service: Urology;  Laterality: N/A;   HEMORRHOIDECTOMY WITH HEMORRHOID BANDING     KNEE  ARTHROSCOPY Left    PACEMAKER INSERTION N/A 03/03/2015   Procedure: INSERTION DUAL LEAD PACEMAKER;  Surgeon: Isaias Cowman, MD;  Location: [facility];  Service: Cardiovascular;  Laterality: N/A;   PULSE GENERATOR IMPLANT N/A 10/18/2020   Procedure: UNILATERAL PULSE GENERATOR IMPLANT;  Surgeon: [name], MD;  Location: [facility];  Service: Neurosurgery;  Laterality: N/A;   SPINAL CORD STIMULATOR  TRIAL N/A 10/11/2020   Procedure: PERCUTANEOUS SPINAL CORD STIMULATOR TRIAL;  Surgeon: [name], MD;  Location: [facility];  Service: Neurosurgery;  Laterality: N/A;   THORACIC LAMINECTOMY FOR SPINAL CORD STIMULATOR N/A 10/18/2020   Procedure: PERCUTANEOUS LEAD PLACEMENT;  Surgeon: [name], MD;  Location: [facility];  Service: Neurosurgery;  Laterality: N/A;   TONSILLECTOMY     TRANSURETHRAL RESECTION OF BLADDER TUMOR N/A 10/07/2019   Procedure: TRANSURETHRAL RESECTION OF BLADDER TUMOR (TURBT);  Surgeon: Abbie Sons, MD;  Location: [facility];  Service: Urology;  Laterality: N/A;   TRANSURETHRAL RESECTION OF BLADDER TUMOR N/A 04/13/2020   Procedure: TRANSURETHRAL RESECTION OF BLADDER TUMOR (TURBT);  Surgeon: Abbie Sons, MD;  Location: [facility];  Service: Urology;  Laterality: N/A;    HEMATOLOGY/ONCOLOGY HISTORY:  Oncology History Overview Note  # NOV 2020- [incidental/hematuria work-up]-subtle peritoneal thickening/plaque-like lesions; March 2021-CT scan progressive omental/peritoneal thickening  #Microscopic hematuria- sec to BPH [cystoscopy/CT urogram negative; Dr.Stoiff]; March 2021 CT scan-new lesion in the bladder diverticulum  # MAY-June 2021- severe IDA sec to hemauria [hb 8.5; Ferritin- ]  #July 2022-hemorrhagic shock/bladder cancer-s/p radiation [facility]; no chemotherapy.[Dr.Neilsen]  # AUG 11th, 2023- PET scan- Hypermetabolic metastatic osseous lesions, left supraclavicular lymph nodes, right upper lobe nodule and abdominal retroperitoneal adenopathy- BIOPSY- POSITIVE FOR MALIGNANCY; QNS for IHC. BACK RT s/p March 10, 2022 '[]'$   # SEP 7th, 2023- Keytruda single agent.   # on eliquis ? A.fib/pacemaker;   Urothelial carcinoma of bladder ([place])  10/29/2019 Initial Diagnosis   Urothelial carcinoma of bladder ([place])   Cancer of overlapping sites of bladder ([place])  02/17/2022 Initial Diagnosis   Cancer of overlapping sites of bladder ([place])   02/17/2022 Cancer Staging   Staging  form: Urinary Bladder, AJCC 8th Edition - Clinical: Stage Unknown (cTX, cN2, cM1) - Signed by Cammie Sickle, MD on 02/17/2022   03/16/2022 -  Chemotherapy   Patient is on Treatment Plan : BLADDER Pembrolizumab (200) q21d       ALLERGIES:  is allergic to atenolol.  MEDICATIONS:  Current Outpatient Medications  Medication Sig Dispense Refill   amiodarone (PACERONE) 200 MG tablet Take 1 tablet (200 mg total) by mouth 2 (two) times daily for 3 days. 6 tablet 0   amiodarone (PACERONE) 200 MG tablet Take 1 tablet (200 mg total) by mouth daily. 30 tablet 0   atorvastatin (LIPITOR) 10 MG tablet Take 10 mg by mouth in the morning.     calcium carbonate (OSCAL) 1500 (600 Ca) MG TABS tablet Take 600 mg of elemental calcium by mouth in the morning.     Cholecalciferol (VITAMIN D3) 50 MCG (2000 UT) TABS Take 2,000 Units by mouth in the morning.     diltiazem (CARDIZEM CD) 240 MG 24 hr capsule Take 1 capsule (240 mg total) by mouth daily. 30 capsule 0   ELIQUIS 5 MG TABS tablet Take 1 tablet by mouth 2 (two) times daily.     ferrous sulfate 325 (65 FE) MG tablet Take 325 mg by mouth in the morning and at bedtime.     finasteride (PROSCAR) 5 MG tablet  TAKE 1 TABLET DAILY (Patient taking differently: Take 5 mg by mouth daily.) 90 tablet 3   HYDROcodone-acetaminophen (NORCO/VICODIN) 5-325 MG tablet Take 1-2 tablets by mouth every 4 (four) hours as needed for moderate pain. 8 tablet 0   metoprolol tartrate (LOPRESSOR) 50 MG tablet Take 1 tablet (50 mg total) by mouth 2 (two) times daily. (Patient not taking: Reported on 05/19/2022) 60 tablet 0   Multiple Vitamins-Minerals (PRESERVISION AREDS 2 PO) Take 1 tablet by mouth in the morning and at bedtime.     Omega-3 Fatty Acids (FISH OIL) 1000 MG CAPS Take 1,000 mg by mouth daily.     tamsulosin (FLOMAX) 0.4 MG CAPS capsule Take 0.4 mg by mouth daily.     No current facility-administered medications for this visit.    VITAL SIGNS: There were no vitals  taken for this visit. There were no vitals filed for this visit.  Estimated body mass index is 21.43 kg/m as calculated from the following:   Height as of 05/19/22: '5\' 11"'$  (1.803 m).   Weight as of 05/19/22: 153 lb 10.6 oz (69.7 kg).  LABS: CBC:    Component Value Date/Time   WBC 8.7 05/25/2022 0447   HGB 11.0 (L) 05/25/2022 0447   HGB 13.2 01/13/2020 1412   HCT 34.2 (L) 05/25/2022 0447   HCT 39.5 01/13/2020 1412   PLT 285 05/25/2022 0447   PLT 222 01/13/2020 1412   MCV 92.4 05/25/2022 0447   MCV 90 01/13/2020 1412   NEUTROABS 12.3 (H) 05/19/2022 1219   NEUTROABS 3.6 01/13/2020 1412   LYMPHSABS 0.4 (L) 05/19/2022 1219   LYMPHSABS 1.1 01/13/2020 1412   MONOABS 1.0 05/19/2022 1219   EOSABS 0.0 05/19/2022 1219   EOSABS 0.1 01/13/2020 1412   BASOSABS 0.0 05/19/2022 1219   BASOSABS 0.0 01/13/2020 1412   Comprehensive Metabolic Panel:    Component Value Date/Time   NA 138 05/25/2022 0447   NA 141 01/13/2020 1412   K 3.9 05/25/2022 0447   K 4.4 08/10/2011 1443   CL 102 05/25/2022 0447   CO2 29 05/25/2022 0447   BUN 23 05/25/2022 0447   BUN 18 01/13/2020 1412   CREATININE 0.58 (L) 05/25/2022 0447   GLUCOSE 113 (H) 05/25/2022 0447   CALCIUM 8.9 05/25/2022 0447   AST 31 05/20/2022 0516   ALT 41 05/20/2022 0516   ALKPHOS 107 05/20/2022 0516   BILITOT 0.7 05/20/2022 0516   PROT 5.1 (L) 05/20/2022 0516   ALBUMIN 2.2 (L) 05/20/2022 0516    RADIOGRAPHIC STUDIES: DG Chest Port 1 View  Result Date: 05/19/2022 CLINICAL DATA:  Questionable sepsis, evaluate for abnormality. EXAM: PORTABLE CHEST 1 VIEW COMPARISON:  Chest radiograph dated January 16, 2021 FINDINGS: The heart size and mediastinal contours are within normal limits. Prominent atherosclerotic calcification of the aortic arch. Pacemaker leads in the right atrium and right ventricle. Lungs are clear without evidence of focal consolidation or pleural effusion. Spinal stimulator leads projecting over the lower thoracic spine.  Osteopenia and thoracic spondylosis. IMPRESSION: No focal consolidation or large pleural effusion. Additional chronic findings as above. Electronically Signed   By: Keane Police D.O.   On: 05/19/2022 13:00    PERFORMANCE STATUS (ECOG) : 3 - Symptomatic, >50% confined to bed  Review of Systems Unless otherwise noted, a complete review of systems is negative.  Physical Exam General: NAD Cardiovascular: regular rate and rhythm Pulmonary: clear ant fields Abdomen: soft, nontender, + bowel sounds GU: no suprapubic tenderness Extremities: no edema, no joint deformities  Skin: no rashes Neurological: Weakness but otherwise nonfocal  IMPRESSION: Patient was hospitalized 05/20/2019 through 05/25/2022 with hypotension and was found to have a flutter with RVR.  Patient was discharged to rehab.  Patient has had progressive decline over the past couple of months.  He is now bedbound requiring a lift to transfer to wheelchair.  Oral intake remains poor.  Patient has had worse pain in the neck/upper back over the past 1 to 2 weeks.  He has had some difficulty extending his neck fully due to pain.  He denies focal weakness in his extremities.  Patient has been unable to participate in rehab due to severe weakness and pain.  He has been receiving as needed Norco and says that that helps but is unclear if he is receiving consistent dosing of pain medications.  Patient currently rates pain as 5 out of 10.  MAR reviewed but it does not appear updated as last administered dose of pain medications on documentation sent from facility was on 11/22.  I had a long conversation with patient and daughters regarding goals.  Unfortunately, I feel patient is unlikely to significantly improve at this point.  Prognosis is poor and patient is at high risk for continued decline until end-of-life.  We talked about the possibility of progressive cancer and family has requested imaging.  Daughters verbalized understanding that  even if cancer is proved to have progressed, patient is not a treatment candidate at this point.  Daughter is concerned about the possibility of patient returning to her house and being able to physically care for him.  I strongly recommended consideration of hospice.  Daughters seemed open to this idea.  Regarding pain, will plan to schedule dosing of Norco.  I am hesitant to start him on a long-acting as patient has had some sedating effects from medications in the past. Will also start prednisone taper.   PLAN: -Best supportive care -Will obtain x-rays of cervical/thoracic spine given pain -Scheduled dosing of Norco every 6 hours (new prescription given) -Prednisone taper -Recommend referral to palliative care at facility to coordinate hospice involvement when patient is discharged from SNF -RTC TBD  Case and plan discussed with Dr. Rogue Bussing   Patient expressed understanding and was in agreement with this plan. He also understands that He can call the clinic at any time with any questions, concerns, or complaints.     Time Total: 30 minutes   Visit consisted of counseling and education dealing with the complex and emotionally intense issues of symptom management and palliative care in the setting of serious and potentially life-threatening illness.Greater than 50%  of this time was spent counseling and coordinating care related to the above assessment and plan.  Signed by: Altha Harm, PhD, NP-C

## 2022-06-09 NOTE — Addendum Note (Signed)
Addended by: Irean Hong on: 06/09/2022 03:44 PM   Modules accepted: Orders

## 2022-06-12 ENCOUNTER — Encounter: Payer: Self-pay | Admitting: Hospice and Palliative Medicine

## 2022-06-12 ENCOUNTER — Telehealth: Payer: Self-pay | Admitting: *Deleted

## 2022-06-12 ENCOUNTER — Encounter: Payer: Self-pay | Admitting: Nurse Practitioner

## 2022-06-12 ENCOUNTER — Other Ambulatory Visit: Payer: Self-pay

## 2022-06-12 ENCOUNTER — Emergency Department
Admission: EM | Admit: 2022-06-12 | Discharge: 2022-06-12 | Discharge status: Against Medical Advice | Payer: Medicare HMO | Attending: Emergency Medicine | Admitting: Emergency Medicine

## 2022-06-12 ENCOUNTER — Non-Acute Institutional Stay: Payer: Medicare HMO | Admitting: Nurse Practitioner

## 2022-06-12 VITALS — BP 160/75 | Temp 97.2°F | Resp 18 | Wt 178.3 lb

## 2022-06-12 DIAGNOSIS — Z5321 Procedure and treatment not carried out due to patient leaving prior to being seen by health care provider: Secondary | ICD-10-CM | POA: Diagnosis not present

## 2022-06-12 DIAGNOSIS — Z515 Encounter for palliative care: Secondary | ICD-10-CM

## 2022-06-12 DIAGNOSIS — R040 Epistaxis: Secondary | ICD-10-CM | POA: Diagnosis present

## 2022-06-12 DIAGNOSIS — R051 Acute cough: Secondary | ICD-10-CM

## 2022-06-12 DIAGNOSIS — R531 Weakness: Secondary | ICD-10-CM

## 2022-06-12 NOTE — Telephone Encounter (Signed)
Called and spoke with patient's daughter about x-ray findings.  Reportedly, patient's pain and appetite have improved with scheduled pain medications and prednisone.  Patient is not currently having any significant pulmonary symptoms, fever or chills that would be suspicious for pneumonia.  We discussed the possible option of obtaining a chest x-ray at SNF for evaluation of atelectasis but would likely recommend conservative treatment.  She plans to speak with SNF staff about workup.  She has a meeting scheduled later today with physical therapy but recognizes that he is likely not significantly rehab bowl at this point.  We again discussed option of hospice involvement.

## 2022-06-12 NOTE — ED Triage Notes (Signed)
Pt in with co left sided nosebleed, pt is on eliquis. Has not had dose changed recently. No other complaints, no bleeding noted at this time.

## 2022-06-12 NOTE — Progress Notes (Signed)
[facility] Consult Note Telephone: [phone]  Fax: [phone]    Date of encounter: 06/12/22 8:58 PM PATIENT NAME: Christopher [name] [address] [place] [address]   [phone] (home) [phone] (work) DOB: 1927/12/19 MRN: [id number] PRIMARY CARE PROVIDER:  [facility]   [name], MD,  [place] [address] [phone]  RESPONSIBLE PARTY:    Contact Information     Name Relation Home Work [place] Daughter [phone]  [phone]   [email or web address], cerny Daughter   [phone]      I met face to face with patient in facility. Palliative Care was asked to follow this patient by consultation request of  [name], MD/[facility] to address advance care planning and complex medical decision making. This is a follow up visit.                                  ASSESSMENT AND PLAN / RECOMMENDATIONS:  Symptom Management/Plan: 1. Advance Care Planning;  DNR/DNI; MOST form signed on 03/07/2022; Per dtg, wishes are to d/c home when able to get hospital bed, with hospice services.   2. Goals of Care: Goals include to maximize quality of life and symptom management. Our advance care planning conversation included a discussion about:    The value and importance of advance care planning  Exploration of personal, cultural or spiritual beliefs that might influence medical decisions  Exploration of goals of care in the event of a sudden injury or illness  Identification and preparation of a healthcare agent  Review and updating or creation of an advance directive document. 3. Palliative care encounter; Palliative care encounter; Palliative medicine team will continue to support patient, patient's family, and medical team. Visit consisted of counseling and education dealing with the complex and emotionally intense issues of symptom management and palliative care in the setting  of serious and potentially life-threatening illness  4. Generalized weakness, cough with possible aspiration pna; discussed with dtg, concerns with clinical assessment; with epistaxis, pale with increase in weakness, ongoing decline functional abilities, PPS prior to hospitalization 60%; currently 30% with ongoing decline. Agreement by facility provider/dtg to have transfer to ED for further evaluation.  5. Epistaxis ongoing, concern of posterior bleed, will send to ED for further evaluation.  Follow up Palliative Care Visit: Palliative care will continue to follow for complex medical decision making, advance care planning, and clarification of goals. Return 2 weeks or prn.  I spent 47 minutes providing this consultation. More than 50% of the time in this consultation was spent in counseling and care coordination. PPS: 30%  Chief Complaint: Follow up palliative consult for complex medical decision making, address goals, manage ongoing symptoms  HISTORY OF PRESENT ILLNESS:  Christopher [name] is a 88 y.o. year old male  with multiple medical problems including h/o bladder cancer (peritoneal/omental thickening of unclear etiology also with recurrent high-grade bladder cancer status post XRT at [facility]. PET scan February 17, 2022 revealed hypermetabolic metastatic osseous lesions, left supraclavicular lymph node involvement, right upper lobe, and hypermetabolic abdominal retroperitoneal adenopathy. Patient was not felt to be a poor candidate for systemic chemotherapy given his age. He was started on immunotherapy instead), afib, anemia, arthritis, pacemaker, BPH, Gilbert disease, HTN, HLD, macular degeneration, h/o mollusca contagiosa, osteopenia, h/o squamous cell carcinoma. Hospitalized 05/19/2022 to 05/25/2022 for hypotension. Sepsis was ruled out. Pressure ulcer of  coccygeal region stage 2; Urothelial carcinoma of bladder, malnutrition of moderate degree. 06/09/2022 albumin 2.5; total protein 6.6. Christopher [name]  was stabilized and d/c to [facility]. Prior to hospitalization Christopher [name] was ambulatory, able to perform ADL's, feed himself. Since hospitalization Christopher. [name] has not been too weak to ambulate, participate in therapy. Christopher [name] has required total care including assistance with bathing, dressing, toileting. Christopher. [name] requires assistance with feeding with poor appetite, not hungry, not eating. Staff endorses Christopher. [name] has overall declined, becoming weaker. Christopher. Leaming per staff had a nose bleed this am with large clot. EMS came to the facility, per staff put gauze in nostril, felt like bleeding had stopped so daughter wish to keep at facility. At present Christopher [name] is lying in bed, appears chronically ill, very weak. Christopher [name] endorses he did not eat lunch, feels like he is tasting blood. Christopher [name] endorses he has not coughed up any blood but feels like it is draining down the back of his throat. Christopher [name] endorses he continues to feel worse, weak. Functionally Christopher [name] continues to overall decline. I called facility provider with update and in agreement to transfer to ED for further evaluation with possible posterior epistaxis, sepsis with PNA due to aspiration. I contacted Christopher [name] daughter with update and in agreement with poc. Discussed with staff in agreement to have ems transfer to ED. I discussed with daughter her wishes are for Christopher. [name] to d/c home with hospice when services get put in place. Therapeutic listening, emotional support provided. Questions answered.   History obtained from review of EMR, discussion with primary team, and interview with family, facility staff/caregiver and/or Christopher. [name].  I reviewed available labs, medications, imaging, studies and related documents from the EMR.  Records reviewed and summarized above.   ROS 10 point system reviewed all negative except HPI  Physical Exam: Constitutional: NAD General: frail appearing, pale, weak  male ENMT: oral mucous membranes moist CV: S1S2, RRR, no LE edema Pulmonary: coarse rhonchi, decrease bases Abdomen: normo-active BS + 4 quadrants, soft and non tender MSK: non-ambulatory Skin: warm and dry Neuro:  + generalized weakness,  no cognitive impairment Psych: non-anxious affect, A and Oriented Thank you for the opportunity to participate in the care of Christopher. [name]. Please call our office at [phone] if we can be of additional assistance.   Cipriana Biller Ihor Gully, NP

## 2022-06-12 NOTE — Telephone Encounter (Signed)
Message received from Billey Chang, NP regarding patient needing chest xray at facility. Call placed to [facility], this nurse spoke with RN Apolonio Schneiders and verbal order given for chest xray to be performed at facility. Order as follows: Chest xray for evaluation of pulmonary atelectasis/pneumonia. Order faxed to facility as well at [phone]. Nurse also asked facility of status of palliative care referral, RN Apolonio Schneiders will follow up. Copy of order placed to scan in patients chart.

## 2022-06-12 NOTE — ED Triage Notes (Signed)
Arrives from [facility] via [facility].  C?O epistaxis intermittently x 2 days.  Today nose bleed persists.  Per ems, small nose bleed.  VS wnl.  Takes Elliquis.

## 2022-06-14 ENCOUNTER — Encounter: Payer: Self-pay | Admitting: Nurse Practitioner

## 2022-06-14 ENCOUNTER — Non-Acute Institutional Stay: Payer: Medicare HMO | Admitting: Nurse Practitioner

## 2022-06-14 ENCOUNTER — Ambulatory Visit: Payer: Medicare HMO | Admitting: Radiation Oncology

## 2022-06-14 DIAGNOSIS — R630 Anorexia: Secondary | ICD-10-CM

## 2022-06-14 DIAGNOSIS — Z515 Encounter for palliative care: Secondary | ICD-10-CM

## 2022-06-14 DIAGNOSIS — R531 Weakness: Secondary | ICD-10-CM

## 2022-06-14 DIAGNOSIS — C678 Malignant neoplasm of overlapping sites of bladder: Secondary | ICD-10-CM

## 2022-06-14 NOTE — Progress Notes (Signed)
[facility] Consult Note Telephone: [phone]  Fax: [phone]    Date of encounter: 06/14/22 10:50 PM PATIENT NAME: Christopher [name] [place] [name] [id number]   [phone] (home) [phone] (work) DOB: Dec 03, 1927 MRN: [id number] PRIMARY CARE PROVIDER:   Peak [name] Christopher [name], Christopher [name],  Christopher [name] [id number] [phone]  RESPONSIBLE PARTY:    Contact Information     Name Relation Home Work Christopher [name] Daughter [phone]  [phone]   toben, acuna Daughter   [phone]     I met face to face with patient in facility. Palliative Care was asked to follow this patient by consultation request of  Christopher [name], Christopher [name] to address advance care planning and complex medical decision making. This is a follow up visit.                                  ASSESSMENT AND PLAN / RECOMMENDATIONS:  Symptom Management/Plan: 1. Advance Care Planning;  DNR/DNI; MOST form signed on 03/07/2022; Per dtg, wishes are to d/c home with hospice   2. Goals of Care: Goals include to maximize quality of life and symptom management. Our advance care planning conversation included a discussion about:    The value and importance of advance care planning  Exploration of personal, cultural or spiritual beliefs that might influence medical decisions  Exploration of goals of care in the event of a sudden injury or illness  Identification and preparation of a healthcare agent  Review and updating or creation of an advance directive document. 3. Palliative care encounter; Palliative care encounter; Palliative medicine team will continue to support patient, patient's family, and medical team. Visit consisted of counseling and education dealing with the complex and emotionally intense issues of symptom management and palliative care in the setting of serious and potentially  life-threatening illness   4. Generalized weakness/anorexia cough with possible aspiration pna; discussed with dtg, concerns with clinical assessment; with epistaxis, pale with increase in weakness, ongoing decline functional abilities, PPS prior to hospitalization 60%; currently 30% with ongoing decline. Agreement by facility provider/dtg to have transfer to ED for further evaluation.   5. Epistaxis stable   I spent 45 minutes providing this consultation at 9:30am. More than 50% of the time in this consultation was spent in counseling and care coordination. PPS: 30%   Chief Complaint: Follow up palliative consult for complex medical decision making, address goals, manage ongoing symptoms   HISTORY OF PRESENT ILLNESS:  Christopher [name] is a 88 y.o. year old male  with multiple medical problems including h/o bladder cancer (peritoneal/omental thickening of unclear etiology also with recurrent high-grade bladder cancer status post XRT at [facility]. PET scan February 17, 2022 revealed hypermetabolic metastatic osseous lesions, left supraclavicular lymph node involvement, right upper lobe, and hypermetabolic abdominal retroperitoneal adenopathy. Patient was not felt to be a poor candidate for systemic chemotherapy given his age. He was started on immunotherapy instead), afib, anemia, arthritis, pacemaker, BPH, Gilbert disease, HTN, HLD, macular degeneration, h/o mollusca contagiosa, osteopenia, h/o squamous cell carcinoma. Hospitalized 05/19/2022 to 05/25/2022 for hypotension. Sepsis was ruled out. Pressure ulcer of coccygeal region stage 2; Urothelial carcinoma of bladder, malnutrition of moderate degree. 06/09/2022 albumin 2.5; total protein 6.6. Christopher [name] was stabilized and d/c to [facility] [name]. Prior to hospitalization Christopher [name] was ambulatory, able to perform ADL's, feed himself. Since hospitalization  Christopher. [name] has not been too weak to ambulate, participate in therapy. Christopher [name] has required total care  including assistance with bathing, dressing, toileting. Christopher. [name] requires assistance with feeding with poor appetite, not hungry, not eating. Staff endorses Christopher. [name] has overall declined, becoming weaker. Christopher. [name] per staff went to ED by EMS, placed in the waiting room for hours without being seen, was triaged. Staff shared the daughter said Christopher [name] was in too much pain to be waiting for such a long time, she put in private care and brought him back to [facility] where he currently resides. At present Christopher [name] is lying in bed appears pale, weak, debilitated, comfortable. Christopher [name] endorses he is very tired. ROS, answered questions, cooperative with assessment. Christopher [name] endorses ready to go home. Therapeutic listening, emotional support provided. Questions answered. Attempted to contact dtg   History obtained from review of EMR, discussion with primary team, and interview with family, facility staff/caregiver and/or Christopher. [name].  I reviewed available labs, medications, imaging, studies and related documents from the EMR.  Records reviewed and summarized above.    ROS 10 point system reviewed all negative except HPI   Physical Exam: Constitutional: NAD General: frail appearing, pale, weak male ENMT: oral mucous membranes moist CV: S1S2, RRR, no LE edema Pulmonary: coarse rhonchi, decrease bases Abdomen: normo-active BS + 4 quadrants, soft and non tender MSK: non-ambulatory Skin: warm and dry Neuro:  + generalized weakness,  no cognitive impairment Psych: non-anxious affect, A and Oriented  Thank you for the opportunity to participate in the care of Christopher. [name]. Please call our office at [phone] if we can be of additional assistance.   Christopher [name] Ihor Gully, NP

## 2022-06-15 ENCOUNTER — Telehealth: Payer: Self-pay | Admitting: Hospice and Palliative Medicine

## 2022-06-15 ENCOUNTER — Telehealth: Payer: Self-pay | Admitting: Internal Medicine

## 2022-06-15 ENCOUNTER — Encounter: Payer: Self-pay | Admitting: Hospice and Palliative Medicine

## 2022-06-15 NOTE — Telephone Encounter (Signed)
PET has been cancelled due to pending ins auth.

## 2022-06-15 NOTE — Telephone Encounter (Signed)
I spoke with both patient's daughters.  Patient is discharging home from SNF tomorrow (Friday) with plan for hospice start of care the following day (Saturday).  I explained that once patient was enrolled in hospice we generally would not pursue any further imaging and instead would focus on supportive/comfort care at home.  Daughter, Magda Paganini, verbalized agreement with canceling the PET scan scheduled for next week.  However, the other daughter, Lattie Haw, had some reservations.  Lattie Haw plans to speak with Magda Paganini and said she would let us know what they decide.

## 2022-06-15 NOTE — Telephone Encounter (Signed)
Called and spoke to [place] re: no auth for PET and let her know that we had to cancel the PET until we get authorization-Lisa understood and asked for it to be scheduled as soon as we do get the [place].

## 2022-06-16 ENCOUNTER — Telehealth: Payer: Self-pay | Admitting: *Deleted

## 2022-06-16 ENCOUNTER — Encounter: Payer: Self-pay | Admitting: Hospice and Palliative Medicine

## 2022-06-16 NOTE — Telephone Encounter (Signed)
Per family's request, I contacted [facility]- HIM dept to obtain a copy of the chest xray result. Per pt's daughter, pt is being treated for pneumonia. HIM dept will fax a copy of the results by the end of the working day to our [facility] fax #. Josh, NP aware that pt has been dx with pneumonia.

## 2022-06-19 ENCOUNTER — Ambulatory Visit: Payer: Medicare HMO

## 2022-06-19 ENCOUNTER — Telehealth: Payer: Self-pay | Admitting: Internal Medicine

## 2022-06-19 NOTE — Telephone Encounter (Signed)
Pt daughter called, pt is now in hospice care. All appts have been cancelled

## 2022-06-22 ENCOUNTER — Other Ambulatory Visit: Payer: Medicare HMO

## 2022-06-22 ENCOUNTER — Ambulatory Visit: Payer: Medicare HMO | Admitting: Internal Medicine

## 2022-06-22 ENCOUNTER — Encounter: Payer: Medicare HMO | Admitting: Hospice and Palliative Medicine

## 2022-06-26 ENCOUNTER — Ambulatory Visit: Payer: Medicare HMO

## 2022-06-29 ENCOUNTER — Telehealth: Payer: Self-pay | Admitting: *Deleted

## 2022-06-29 NOTE — Telephone Encounter (Signed)
Call from hospice nurse reporting that patient has had an increase in pain and needs pain medicine adjustment. Please return her call [phone]

## 2022-06-30 ENCOUNTER — Encounter: Payer: Self-pay | Admitting: Internal Medicine

## 2022-07-10 DEATH — deceased
